# Patient Record
Sex: Female | Born: 1945 | Race: Black or African American | Hispanic: No | Marital: Married | Smoking: Former smoker
Health system: Southern US, Community
[De-identification: ages and names within clinical notes are randomized; demographics above are authoritative.]

## PROBLEM LIST (undated history)

## (undated) DIAGNOSIS — D649 Anemia, unspecified: Secondary | ICD-10-CM

## (undated) DIAGNOSIS — E785 Hyperlipidemia, unspecified: Secondary | ICD-10-CM

## (undated) DIAGNOSIS — I10 Essential (primary) hypertension: Secondary | ICD-10-CM

## (undated) DIAGNOSIS — G2581 Restless legs syndrome: Secondary | ICD-10-CM

## (undated) DIAGNOSIS — I2510 Atherosclerotic heart disease of native coronary artery without angina pectoris: Secondary | ICD-10-CM

## (undated) DIAGNOSIS — J449 Chronic obstructive pulmonary disease, unspecified: Secondary | ICD-10-CM

## (undated) DIAGNOSIS — I219 Acute myocardial infarction, unspecified: Secondary | ICD-10-CM

## (undated) DIAGNOSIS — N952 Postmenopausal atrophic vaginitis: Secondary | ICD-10-CM

## (undated) DIAGNOSIS — M1990 Unspecified osteoarthritis, unspecified site: Secondary | ICD-10-CM

## (undated) HISTORY — DX: Unspecified osteoarthritis, unspecified site: M19.90

## (undated) HISTORY — DX: Restless legs syndrome: G25.81

## (undated) HISTORY — DX: Hyperlipidemia, unspecified: E78.5

## (undated) HISTORY — DX: Postmenopausal atrophic vaginitis: N95.2

## (undated) HISTORY — DX: Chronic obstructive pulmonary disease, unspecified: J44.9

## (undated) HISTORY — DX: Atherosclerotic heart disease of native coronary artery without angina pectoris: I25.10

---

## 1968-12-30 HISTORY — PX: OTHER SURGICAL HISTORY: SHX169

## 1968-12-30 HISTORY — PX: CHOLECYSTECTOMY: SHX55

## 1998-02-24 ENCOUNTER — Emergency Department (HOSPITAL_COMMUNITY): Admission: EM | Admit: 1998-02-24 | Discharge: 1998-02-24 | Payer: Self-pay | Admitting: Emergency Medicine

## 1999-09-04 ENCOUNTER — Emergency Department (HOSPITAL_COMMUNITY): Admission: EM | Admit: 1999-09-04 | Discharge: 1999-09-04 | Payer: Self-pay

## 2004-04-29 ENCOUNTER — Emergency Department (HOSPITAL_COMMUNITY): Admission: EM | Admit: 2004-04-29 | Discharge: 2004-04-30 | Payer: Self-pay | Admitting: Emergency Medicine

## 2004-06-21 ENCOUNTER — Encounter: Admission: RE | Admit: 2004-06-21 | Discharge: 2004-06-21 | Payer: Self-pay | Admitting: Neurology

## 2006-12-20 ENCOUNTER — Emergency Department (HOSPITAL_COMMUNITY): Admission: EM | Admit: 2006-12-20 | Discharge: 2006-12-20 | Payer: Self-pay | Admitting: Emergency Medicine

## 2008-08-24 ENCOUNTER — Emergency Department (HOSPITAL_COMMUNITY): Admission: EM | Admit: 2008-08-24 | Discharge: 2008-08-25 | Payer: Self-pay | Admitting: Emergency Medicine

## 2008-12-11 ENCOUNTER — Encounter: Admission: RE | Admit: 2008-12-11 | Discharge: 2008-12-11 | Payer: Self-pay | Admitting: Family Medicine

## 2009-07-16 ENCOUNTER — Encounter: Admission: RE | Admit: 2009-07-16 | Discharge: 2009-07-16 | Payer: Self-pay | Admitting: Family Medicine

## 2009-12-14 ENCOUNTER — Encounter: Admission: RE | Admit: 2009-12-14 | Discharge: 2009-12-14 | Payer: Self-pay | Admitting: Family Medicine

## 2010-02-11 ENCOUNTER — Encounter: Payer: Self-pay | Admitting: Emergency Medicine

## 2010-02-11 ENCOUNTER — Inpatient Hospital Stay (HOSPITAL_COMMUNITY): Admission: EM | Admit: 2010-02-11 | Discharge: 2010-02-14 | Payer: Self-pay | Admitting: Interventional Cardiology

## 2010-02-14 ENCOUNTER — Encounter (INDEPENDENT_AMBULATORY_CARE_PROVIDER_SITE_OTHER): Payer: Self-pay | Admitting: Interventional Cardiology

## 2010-03-03 ENCOUNTER — Encounter (HOSPITAL_COMMUNITY)
Admission: RE | Admit: 2010-03-03 | Discharge: 2010-05-31 | Payer: Self-pay | Source: Home / Self Care | Attending: Interventional Cardiology | Admitting: Interventional Cardiology

## 2010-04-22 ENCOUNTER — Encounter
Admission: RE | Admit: 2010-04-22 | Discharge: 2010-04-22 | Payer: Self-pay | Source: Home / Self Care | Attending: Family Medicine | Admitting: Family Medicine

## 2010-05-06 LAB — GLUCOSE, CAPILLARY: Glucose-Capillary: 104 mg/dL — ABNORMAL HIGH (ref 70–99)

## 2010-05-16 LAB — GLUCOSE, CAPILLARY: Glucose-Capillary: 200 mg/dL — ABNORMAL HIGH (ref 70–99)

## 2010-05-18 LAB — GLUCOSE, CAPILLARY: Glucose-Capillary: 112 mg/dL — ABNORMAL HIGH (ref 70–99)

## 2010-05-22 ENCOUNTER — Encounter: Payer: Self-pay | Admitting: Family Medicine

## 2010-05-22 ENCOUNTER — Encounter: Payer: Self-pay | Admitting: Neurology

## 2010-05-30 LAB — GLUCOSE, CAPILLARY: Glucose-Capillary: 195 mg/dL — ABNORMAL HIGH (ref 70–99)

## 2010-06-01 ENCOUNTER — Encounter (HOSPITAL_COMMUNITY): Payer: Medicare Other | Attending: Interventional Cardiology

## 2010-06-01 DIAGNOSIS — N289 Disorder of kidney and ureter, unspecified: Secondary | ICD-10-CM | POA: Insufficient documentation

## 2010-06-01 DIAGNOSIS — I2582 Chronic total occlusion of coronary artery: Secondary | ICD-10-CM | POA: Insufficient documentation

## 2010-06-01 DIAGNOSIS — Z9861 Coronary angioplasty status: Secondary | ICD-10-CM | POA: Insufficient documentation

## 2010-06-01 DIAGNOSIS — E119 Type 2 diabetes mellitus without complications: Secondary | ICD-10-CM | POA: Insufficient documentation

## 2010-06-01 DIAGNOSIS — I509 Heart failure, unspecified: Secondary | ICD-10-CM | POA: Insufficient documentation

## 2010-06-01 DIAGNOSIS — Z5189 Encounter for other specified aftercare: Secondary | ICD-10-CM | POA: Insufficient documentation

## 2010-06-01 DIAGNOSIS — I10 Essential (primary) hypertension: Secondary | ICD-10-CM | POA: Insufficient documentation

## 2010-06-01 DIAGNOSIS — F17200 Nicotine dependence, unspecified, uncomplicated: Secondary | ICD-10-CM | POA: Insufficient documentation

## 2010-06-01 DIAGNOSIS — J45909 Unspecified asthma, uncomplicated: Secondary | ICD-10-CM | POA: Insufficient documentation

## 2010-06-01 DIAGNOSIS — I214 Non-ST elevation (NSTEMI) myocardial infarction: Secondary | ICD-10-CM | POA: Insufficient documentation

## 2010-06-01 DIAGNOSIS — I059 Rheumatic mitral valve disease, unspecified: Secondary | ICD-10-CM | POA: Insufficient documentation

## 2010-06-01 DIAGNOSIS — Z7982 Long term (current) use of aspirin: Secondary | ICD-10-CM | POA: Insufficient documentation

## 2010-06-01 DIAGNOSIS — I2510 Atherosclerotic heart disease of native coronary artery without angina pectoris: Secondary | ICD-10-CM | POA: Insufficient documentation

## 2010-06-01 DIAGNOSIS — Z880 Allergy status to penicillin: Secondary | ICD-10-CM | POA: Insufficient documentation

## 2010-06-03 ENCOUNTER — Encounter (HOSPITAL_COMMUNITY): Payer: Medicare Other

## 2010-06-06 ENCOUNTER — Encounter (HOSPITAL_COMMUNITY): Payer: Medicare Other

## 2010-06-08 ENCOUNTER — Encounter (HOSPITAL_COMMUNITY): Payer: Medicare Other

## 2010-06-10 ENCOUNTER — Encounter (HOSPITAL_COMMUNITY): Payer: Medicare Other

## 2010-06-13 ENCOUNTER — Encounter (HOSPITAL_COMMUNITY): Payer: Medicare Other

## 2010-06-15 ENCOUNTER — Encounter (HOSPITAL_COMMUNITY): Payer: Medicare Other

## 2010-06-17 ENCOUNTER — Encounter (HOSPITAL_COMMUNITY): Payer: Medicare Other

## 2010-06-20 ENCOUNTER — Encounter (HOSPITAL_COMMUNITY): Payer: Medicare Other

## 2010-06-22 ENCOUNTER — Encounter (HOSPITAL_COMMUNITY): Payer: Medicare Other

## 2010-06-23 ENCOUNTER — Other Ambulatory Visit: Payer: Self-pay | Admitting: Family Medicine

## 2010-06-23 DIAGNOSIS — E049 Nontoxic goiter, unspecified: Secondary | ICD-10-CM

## 2010-06-24 ENCOUNTER — Encounter (HOSPITAL_COMMUNITY): Payer: Medicare Other

## 2010-06-27 ENCOUNTER — Encounter (HOSPITAL_COMMUNITY): Payer: Medicare Other

## 2010-06-27 ENCOUNTER — Ambulatory Visit
Admission: RE | Admit: 2010-06-27 | Discharge: 2010-06-27 | Disposition: A | Discharge status: Home / Self Care | Payer: Medicare Other | Source: Ambulatory Visit | Attending: Family Medicine | Admitting: Family Medicine

## 2010-06-27 DIAGNOSIS — E049 Nontoxic goiter, unspecified: Secondary | ICD-10-CM

## 2010-06-29 ENCOUNTER — Encounter (HOSPITAL_COMMUNITY): Payer: Medicare Other

## 2010-07-01 ENCOUNTER — Encounter (HOSPITAL_COMMUNITY): Payer: Self-pay

## 2010-07-04 ENCOUNTER — Encounter (HOSPITAL_COMMUNITY): Payer: Self-pay

## 2010-07-06 ENCOUNTER — Encounter (HOSPITAL_COMMUNITY): Payer: Self-pay

## 2010-07-08 ENCOUNTER — Encounter (HOSPITAL_COMMUNITY): Payer: Self-pay

## 2010-07-11 ENCOUNTER — Encounter (HOSPITAL_COMMUNITY): Payer: Self-pay

## 2010-07-11 LAB — GLUCOSE, CAPILLARY
Glucose-Capillary: 119 mg/dL — ABNORMAL HIGH (ref 70–99)
Glucose-Capillary: 136 mg/dL — ABNORMAL HIGH (ref 70–99)
Glucose-Capillary: 138 mg/dL — ABNORMAL HIGH (ref 70–99)
Glucose-Capillary: 152 mg/dL — ABNORMAL HIGH (ref 70–99)
Glucose-Capillary: 153 mg/dL — ABNORMAL HIGH (ref 70–99)
Glucose-Capillary: 99 mg/dL (ref 70–99)

## 2010-07-12 LAB — GLUCOSE, CAPILLARY
Glucose-Capillary: 105 mg/dL — ABNORMAL HIGH (ref 70–99)
Glucose-Capillary: 107 mg/dL — ABNORMAL HIGH (ref 70–99)
Glucose-Capillary: 110 mg/dL — ABNORMAL HIGH (ref 70–99)
Glucose-Capillary: 116 mg/dL — ABNORMAL HIGH (ref 70–99)
Glucose-Capillary: 122 mg/dL — ABNORMAL HIGH (ref 70–99)
Glucose-Capillary: 156 mg/dL — ABNORMAL HIGH (ref 70–99)
Glucose-Capillary: 194 mg/dL — ABNORMAL HIGH (ref 70–99)

## 2010-07-13 ENCOUNTER — Encounter (HOSPITAL_COMMUNITY): Payer: Self-pay

## 2010-07-13 ENCOUNTER — Encounter (HOSPITAL_COMMUNITY): Payer: Self-pay | Attending: Interventional Cardiology

## 2010-07-13 LAB — CBC
HCT: 34.5 % — ABNORMAL LOW (ref 36.0–46.0)
Hemoglobin: 13.3 g/dL (ref 12.0–15.0)
MCH: 25.2 pg — ABNORMAL LOW (ref 26.0–34.0)
MCH: 26.4 pg (ref 26.0–34.0)
MCHC: 31.3 g/dL (ref 30.0–36.0)
MCV: 80.4 fL (ref 78.0–100.0)
MCV: 78.2 fL (ref 78.0–100.0)
Platelets: 266 10*3/uL (ref 150–400)
RBC: 5.05 MIL/uL (ref 3.87–5.11)
RDW: 14.9 % (ref 11.5–15.5)
WBC: 15.9 10*3/uL — ABNORMAL HIGH (ref 4.0–10.5)

## 2010-07-13 LAB — URINE CULTURE
Colony Count: 50000
Culture  Setup Time: 201110150114

## 2010-07-13 LAB — BASIC METABOLIC PANEL
BUN: 8 mg/dL (ref 6–23)
CO2: 20 mEq/L (ref 19–32)
Calcium: 9 mg/dL (ref 8.4–10.5)
Calcium: 9.1 mg/dL (ref 8.4–10.5)
Creatinine, Ser: 0.66 mg/dL (ref 0.4–1.2)
Creatinine, Ser: 0.67 mg/dL (ref 0.4–1.2)
GFR calc non Af Amer: >60 mL/min (ref >60)
GFR calc non Af Amer: >60 mL/min (ref >60)
Glucose, Bld: 173 mg/dL — ABNORMAL HIGH (ref 70–99)
Potassium: 3.6 mEq/L (ref 3.5–5.1)
Sodium: 135 mEq/L (ref 135–145)

## 2010-07-13 LAB — BRAIN NATRIURETIC PEPTIDE
Pro B Natriuretic peptide (BNP): 455 pg/mL — ABNORMAL HIGH (ref 0.0–100.0)
Pro B Natriuretic peptide (BNP): 500 pg/mL — ABNORMAL HIGH (ref 0.0–100.0)

## 2010-07-13 LAB — GLUCOSE, CAPILLARY
Glucose-Capillary: 158 mg/dL — ABNORMAL HIGH (ref 70–99)
Glucose-Capillary: 162 mg/dL — ABNORMAL HIGH (ref 70–99)
Glucose-Capillary: 176 mg/dL — ABNORMAL HIGH (ref 70–99)
Glucose-Capillary: 200 mg/dL — ABNORMAL HIGH (ref 70–99)
Glucose-Capillary: 231 mg/dL — ABNORMAL HIGH (ref 70–99)

## 2010-07-13 LAB — COMPREHENSIVE METABOLIC PANEL
ALT: 23 U/L (ref 0–35)
AST: 52 U/L — ABNORMAL HIGH (ref 0–37)
Albumin: 3.6 g/dL (ref 3.5–5.2)
CO2: 24 mEq/L (ref 19–32)
Calcium: 9.8 mg/dL (ref 8.4–10.5)
Chloride: 102 mEq/L (ref 96–112)
Creatinine, Ser: 1.21 mg/dL — ABNORMAL HIGH (ref 0.4–1.2)
GFR calc Af Amer: 54 mL/min — ABNORMAL LOW (ref >60)
Sodium: 137 mEq/L (ref 135–145)
Total Bilirubin: 0.8 mg/dL (ref 0.3–1.2)

## 2010-07-13 LAB — DIFFERENTIAL
Eosinophils Absolute: 0 10*3/uL (ref 0.0–0.7)
Eosinophils Relative: 0 % (ref 0–5)
Lymphocytes Relative: 28 % (ref 12–46)
Lymphs Abs: 4.5 10*3/uL — ABNORMAL HIGH (ref 0.7–4.0)
Monocytes Absolute: 1.4 10*3/uL — ABNORMAL HIGH (ref 0.1–1.0)

## 2010-07-13 LAB — URINALYSIS, ROUTINE W REFLEX MICROSCOPIC
Nitrite: NEGATIVE
Protein, ur: 30 mg/dL — AB
Urobilinogen, UA: 1 mg/dL (ref 0.0–1.0)

## 2010-07-13 LAB — HEPATIC FUNCTION PANEL
Bilirubin, Direct: <0.1 mg/dL (ref 0.0–0.3)
Total Bilirubin: 0.3 mg/dL (ref 0.3–1.2)

## 2010-07-13 LAB — CK TOTAL AND CKMB (NOT AT ARMC)
CK, MB: 6.4 ng/mL (ref 0.3–4.0)
Relative Index: 3.3 — ABNORMAL HIGH (ref 0.0–2.5)
Relative Index: 4.2 — ABNORMAL HIGH (ref 0.0–2.5)

## 2010-07-13 LAB — LIPID PANEL
Cholesterol: 154 mg/dL (ref 0–200)
LDL Cholesterol: 90 mg/dL (ref 0–99)

## 2010-07-13 LAB — CARDIAC PANEL(CRET KIN+CKTOT+MB+TROPI): Troponin I: 6.89 ng/mL (ref 0.00–0.06)

## 2010-07-13 LAB — URINE MICROSCOPIC-ADD ON

## 2010-07-14 LAB — MRSA PCR SCREENING: MRSA by PCR: NEGATIVE

## 2010-07-14 LAB — GLUCOSE, CAPILLARY: Glucose-Capillary: 164 mg/dL — ABNORMAL HIGH (ref 70–99)

## 2010-07-15 ENCOUNTER — Encounter (HOSPITAL_COMMUNITY): Payer: Self-pay

## 2010-07-18 ENCOUNTER — Encounter (HOSPITAL_COMMUNITY): Payer: Self-pay

## 2010-07-20 ENCOUNTER — Encounter (HOSPITAL_COMMUNITY): Payer: Self-pay

## 2010-07-22 ENCOUNTER — Encounter (HOSPITAL_COMMUNITY): Payer: Self-pay

## 2010-07-25 ENCOUNTER — Encounter (HOSPITAL_COMMUNITY): Payer: Self-pay

## 2010-07-25 ENCOUNTER — Encounter (HOSPITAL_COMMUNITY): Payer: Self-pay | Attending: Interventional Cardiology

## 2010-07-25 ENCOUNTER — Other Ambulatory Visit: Payer: Self-pay | Admitting: Interventional Cardiology

## 2010-07-25 DIAGNOSIS — I2582 Chronic total occlusion of coronary artery: Secondary | ICD-10-CM | POA: Insufficient documentation

## 2010-07-25 DIAGNOSIS — E119 Type 2 diabetes mellitus without complications: Secondary | ICD-10-CM | POA: Insufficient documentation

## 2010-07-25 DIAGNOSIS — Z9861 Coronary angioplasty status: Secondary | ICD-10-CM | POA: Insufficient documentation

## 2010-07-25 DIAGNOSIS — I059 Rheumatic mitral valve disease, unspecified: Secondary | ICD-10-CM | POA: Insufficient documentation

## 2010-07-25 DIAGNOSIS — J45909 Unspecified asthma, uncomplicated: Secondary | ICD-10-CM | POA: Insufficient documentation

## 2010-07-25 DIAGNOSIS — Z7982 Long term (current) use of aspirin: Secondary | ICD-10-CM | POA: Insufficient documentation

## 2010-07-25 DIAGNOSIS — I10 Essential (primary) hypertension: Secondary | ICD-10-CM | POA: Insufficient documentation

## 2010-07-25 DIAGNOSIS — I509 Heart failure, unspecified: Secondary | ICD-10-CM | POA: Insufficient documentation

## 2010-07-25 DIAGNOSIS — I214 Non-ST elevation (NSTEMI) myocardial infarction: Secondary | ICD-10-CM | POA: Insufficient documentation

## 2010-07-25 DIAGNOSIS — Z5189 Encounter for other specified aftercare: Secondary | ICD-10-CM | POA: Insufficient documentation

## 2010-07-25 DIAGNOSIS — Z880 Allergy status to penicillin: Secondary | ICD-10-CM | POA: Insufficient documentation

## 2010-07-25 DIAGNOSIS — I2510 Atherosclerotic heart disease of native coronary artery without angina pectoris: Secondary | ICD-10-CM | POA: Insufficient documentation

## 2010-07-25 DIAGNOSIS — F17200 Nicotine dependence, unspecified, uncomplicated: Secondary | ICD-10-CM | POA: Insufficient documentation

## 2010-07-25 DIAGNOSIS — N289 Disorder of kidney and ureter, unspecified: Secondary | ICD-10-CM | POA: Insufficient documentation

## 2010-07-27 ENCOUNTER — Encounter (HOSPITAL_COMMUNITY): Payer: Self-pay

## 2010-07-29 ENCOUNTER — Encounter (HOSPITAL_COMMUNITY): Payer: Self-pay

## 2010-08-01 ENCOUNTER — Encounter (HOSPITAL_COMMUNITY): Payer: Self-pay | Attending: Interventional Cardiology

## 2010-08-01 ENCOUNTER — Encounter (HOSPITAL_COMMUNITY): Payer: Self-pay

## 2010-08-01 DIAGNOSIS — F17200 Nicotine dependence, unspecified, uncomplicated: Secondary | ICD-10-CM | POA: Insufficient documentation

## 2010-08-01 DIAGNOSIS — I509 Heart failure, unspecified: Secondary | ICD-10-CM | POA: Insufficient documentation

## 2010-08-01 DIAGNOSIS — Z880 Allergy status to penicillin: Secondary | ICD-10-CM | POA: Insufficient documentation

## 2010-08-01 DIAGNOSIS — J45909 Unspecified asthma, uncomplicated: Secondary | ICD-10-CM | POA: Insufficient documentation

## 2010-08-01 DIAGNOSIS — I214 Non-ST elevation (NSTEMI) myocardial infarction: Secondary | ICD-10-CM | POA: Insufficient documentation

## 2010-08-01 DIAGNOSIS — N289 Disorder of kidney and ureter, unspecified: Secondary | ICD-10-CM | POA: Insufficient documentation

## 2010-08-01 DIAGNOSIS — I2510 Atherosclerotic heart disease of native coronary artery without angina pectoris: Secondary | ICD-10-CM | POA: Insufficient documentation

## 2010-08-01 DIAGNOSIS — Z9861 Coronary angioplasty status: Secondary | ICD-10-CM | POA: Insufficient documentation

## 2010-08-01 DIAGNOSIS — E119 Type 2 diabetes mellitus without complications: Secondary | ICD-10-CM | POA: Insufficient documentation

## 2010-08-01 DIAGNOSIS — I2582 Chronic total occlusion of coronary artery: Secondary | ICD-10-CM | POA: Insufficient documentation

## 2010-08-01 DIAGNOSIS — Z7982 Long term (current) use of aspirin: Secondary | ICD-10-CM | POA: Insufficient documentation

## 2010-08-01 DIAGNOSIS — I10 Essential (primary) hypertension: Secondary | ICD-10-CM | POA: Insufficient documentation

## 2010-08-01 DIAGNOSIS — Z5189 Encounter for other specified aftercare: Secondary | ICD-10-CM | POA: Insufficient documentation

## 2010-08-01 DIAGNOSIS — I059 Rheumatic mitral valve disease, unspecified: Secondary | ICD-10-CM | POA: Insufficient documentation

## 2010-08-03 ENCOUNTER — Encounter (HOSPITAL_COMMUNITY): Payer: Self-pay

## 2010-08-05 ENCOUNTER — Encounter (HOSPITAL_COMMUNITY): Payer: Self-pay

## 2010-08-08 ENCOUNTER — Encounter (HOSPITAL_COMMUNITY): Payer: Self-pay

## 2010-08-10 ENCOUNTER — Encounter (HOSPITAL_COMMUNITY): Payer: Self-pay

## 2010-08-10 LAB — DIFFERENTIAL
Basophils Absolute: 0.1 10*3/uL (ref 0.0–0.1)
Lymphs Abs: 3.2 10*3/uL (ref 0.7–4.0)
Monocytes Relative: 6 % (ref 3–12)
Neutrophils Relative %: 53 % (ref 43–77)

## 2010-08-10 LAB — COMPREHENSIVE METABOLIC PANEL
ALT: 18 U/L (ref 0–35)
BUN: 10 mg/dL (ref 6–23)
CO2: 27 mEq/L (ref 19–32)
Calcium: 9.8 mg/dL (ref 8.4–10.5)
Creatinine, Ser: 0.73 mg/dL (ref 0.4–1.2)
GFR calc non Af Amer: >60 mL/min (ref >60)
Glucose, Bld: 342 mg/dL — ABNORMAL HIGH (ref 70–99)
Sodium: 138 mEq/L (ref 135–145)

## 2010-08-10 LAB — CBC
HCT: 43.4 % (ref 36.0–46.0)
Hemoglobin: 14.6 g/dL (ref 12.0–15.0)
MCHC: 33.5 g/dL (ref 30.0–36.0)
MCV: 80 fL (ref 78.0–100.0)
RBC: 5.43 MIL/uL — ABNORMAL HIGH (ref 3.87–5.11)

## 2010-08-10 LAB — POCT CARDIAC MARKERS: Troponin i, poc: <0.05 ng/mL (ref 0.00–0.09)

## 2010-08-12 ENCOUNTER — Encounter (HOSPITAL_COMMUNITY): Payer: Self-pay

## 2010-08-15 ENCOUNTER — Encounter (HOSPITAL_COMMUNITY): Payer: Self-pay

## 2010-08-17 ENCOUNTER — Encounter (HOSPITAL_COMMUNITY): Payer: Self-pay

## 2010-08-19 ENCOUNTER — Encounter (HOSPITAL_COMMUNITY): Payer: Self-pay

## 2010-08-22 ENCOUNTER — Encounter (HOSPITAL_COMMUNITY): Payer: Self-pay

## 2010-08-24 ENCOUNTER — Encounter (HOSPITAL_COMMUNITY): Payer: Self-pay

## 2010-08-26 ENCOUNTER — Encounter (HOSPITAL_COMMUNITY): Payer: Self-pay

## 2010-08-29 ENCOUNTER — Encounter (HOSPITAL_COMMUNITY): Payer: Self-pay

## 2010-08-31 ENCOUNTER — Encounter (HOSPITAL_COMMUNITY): Payer: Self-pay | Attending: Interventional Cardiology

## 2010-08-31 ENCOUNTER — Encounter (HOSPITAL_COMMUNITY): Payer: Medicare Other

## 2010-08-31 DIAGNOSIS — Z880 Allergy status to penicillin: Secondary | ICD-10-CM | POA: Insufficient documentation

## 2010-08-31 DIAGNOSIS — I2510 Atherosclerotic heart disease of native coronary artery without angina pectoris: Secondary | ICD-10-CM | POA: Insufficient documentation

## 2010-08-31 DIAGNOSIS — I10 Essential (primary) hypertension: Secondary | ICD-10-CM | POA: Insufficient documentation

## 2010-08-31 DIAGNOSIS — Z5189 Encounter for other specified aftercare: Secondary | ICD-10-CM | POA: Insufficient documentation

## 2010-08-31 DIAGNOSIS — J45909 Unspecified asthma, uncomplicated: Secondary | ICD-10-CM | POA: Insufficient documentation

## 2010-08-31 DIAGNOSIS — E119 Type 2 diabetes mellitus without complications: Secondary | ICD-10-CM | POA: Insufficient documentation

## 2010-08-31 DIAGNOSIS — Z7982 Long term (current) use of aspirin: Secondary | ICD-10-CM | POA: Insufficient documentation

## 2010-08-31 DIAGNOSIS — Z9861 Coronary angioplasty status: Secondary | ICD-10-CM | POA: Insufficient documentation

## 2010-08-31 DIAGNOSIS — I214 Non-ST elevation (NSTEMI) myocardial infarction: Secondary | ICD-10-CM | POA: Insufficient documentation

## 2010-08-31 DIAGNOSIS — I509 Heart failure, unspecified: Secondary | ICD-10-CM | POA: Insufficient documentation

## 2010-08-31 DIAGNOSIS — I059 Rheumatic mitral valve disease, unspecified: Secondary | ICD-10-CM | POA: Insufficient documentation

## 2010-08-31 DIAGNOSIS — I2582 Chronic total occlusion of coronary artery: Secondary | ICD-10-CM | POA: Insufficient documentation

## 2010-08-31 DIAGNOSIS — N289 Disorder of kidney and ureter, unspecified: Secondary | ICD-10-CM | POA: Insufficient documentation

## 2010-08-31 DIAGNOSIS — F17200 Nicotine dependence, unspecified, uncomplicated: Secondary | ICD-10-CM | POA: Insufficient documentation

## 2010-09-02 ENCOUNTER — Encounter (HOSPITAL_COMMUNITY): Payer: Self-pay

## 2010-09-02 ENCOUNTER — Encounter (HOSPITAL_COMMUNITY): Payer: Medicare Other

## 2010-09-05 ENCOUNTER — Encounter (HOSPITAL_COMMUNITY): Payer: Self-pay

## 2010-09-05 ENCOUNTER — Encounter (HOSPITAL_COMMUNITY): Payer: Medicare Other

## 2010-09-07 ENCOUNTER — Encounter (HOSPITAL_COMMUNITY): Payer: Medicare Other

## 2010-09-07 ENCOUNTER — Encounter (HOSPITAL_COMMUNITY): Payer: Self-pay

## 2010-09-09 ENCOUNTER — Encounter (HOSPITAL_COMMUNITY): Payer: Self-pay

## 2010-09-09 ENCOUNTER — Encounter (HOSPITAL_COMMUNITY): Payer: Medicare Other

## 2010-09-12 ENCOUNTER — Encounter (HOSPITAL_COMMUNITY): Payer: Self-pay

## 2010-09-12 ENCOUNTER — Encounter (HOSPITAL_COMMUNITY): Payer: Medicare Other

## 2010-09-14 ENCOUNTER — Encounter (HOSPITAL_COMMUNITY): Payer: Self-pay

## 2010-09-14 ENCOUNTER — Encounter (HOSPITAL_COMMUNITY): Payer: Medicare Other

## 2010-09-16 ENCOUNTER — Encounter (HOSPITAL_COMMUNITY): Payer: Medicare Other

## 2010-09-16 ENCOUNTER — Encounter (HOSPITAL_COMMUNITY): Payer: Self-pay

## 2010-09-19 ENCOUNTER — Encounter (HOSPITAL_COMMUNITY): Payer: Medicare Other

## 2010-09-19 ENCOUNTER — Encounter (HOSPITAL_COMMUNITY): Payer: Self-pay

## 2010-09-21 ENCOUNTER — Encounter (HOSPITAL_COMMUNITY): Payer: Self-pay

## 2010-09-21 ENCOUNTER — Encounter (HOSPITAL_COMMUNITY): Payer: Medicare Other

## 2010-09-23 ENCOUNTER — Encounter (HOSPITAL_COMMUNITY): Payer: Self-pay

## 2010-09-23 ENCOUNTER — Encounter (HOSPITAL_COMMUNITY): Payer: Medicare Other

## 2010-09-26 ENCOUNTER — Encounter (HOSPITAL_COMMUNITY): Payer: Medicare Other

## 2010-09-26 ENCOUNTER — Encounter (HOSPITAL_COMMUNITY): Payer: Self-pay

## 2010-09-28 ENCOUNTER — Encounter (HOSPITAL_COMMUNITY): Payer: Medicare Other

## 2010-09-28 ENCOUNTER — Encounter (HOSPITAL_COMMUNITY): Payer: Self-pay

## 2010-09-30 ENCOUNTER — Encounter (HOSPITAL_COMMUNITY): Payer: Self-pay | Attending: Interventional Cardiology

## 2010-09-30 ENCOUNTER — Encounter (HOSPITAL_COMMUNITY): Payer: Medicare Other

## 2010-09-30 DIAGNOSIS — I509 Heart failure, unspecified: Secondary | ICD-10-CM | POA: Insufficient documentation

## 2010-09-30 DIAGNOSIS — F17200 Nicotine dependence, unspecified, uncomplicated: Secondary | ICD-10-CM | POA: Insufficient documentation

## 2010-09-30 DIAGNOSIS — Z9861 Coronary angioplasty status: Secondary | ICD-10-CM | POA: Insufficient documentation

## 2010-09-30 DIAGNOSIS — N289 Disorder of kidney and ureter, unspecified: Secondary | ICD-10-CM | POA: Insufficient documentation

## 2010-09-30 DIAGNOSIS — I059 Rheumatic mitral valve disease, unspecified: Secondary | ICD-10-CM | POA: Insufficient documentation

## 2010-09-30 DIAGNOSIS — Z880 Allergy status to penicillin: Secondary | ICD-10-CM | POA: Insufficient documentation

## 2010-09-30 DIAGNOSIS — I2582 Chronic total occlusion of coronary artery: Secondary | ICD-10-CM | POA: Insufficient documentation

## 2010-09-30 DIAGNOSIS — Z7982 Long term (current) use of aspirin: Secondary | ICD-10-CM | POA: Insufficient documentation

## 2010-09-30 DIAGNOSIS — Z5189 Encounter for other specified aftercare: Secondary | ICD-10-CM | POA: Insufficient documentation

## 2010-09-30 DIAGNOSIS — I10 Essential (primary) hypertension: Secondary | ICD-10-CM | POA: Insufficient documentation

## 2010-09-30 DIAGNOSIS — I2510 Atherosclerotic heart disease of native coronary artery without angina pectoris: Secondary | ICD-10-CM | POA: Insufficient documentation

## 2010-09-30 DIAGNOSIS — I214 Non-ST elevation (NSTEMI) myocardial infarction: Secondary | ICD-10-CM | POA: Insufficient documentation

## 2010-09-30 DIAGNOSIS — J45909 Unspecified asthma, uncomplicated: Secondary | ICD-10-CM | POA: Insufficient documentation

## 2010-09-30 DIAGNOSIS — E119 Type 2 diabetes mellitus without complications: Secondary | ICD-10-CM | POA: Insufficient documentation

## 2010-10-03 ENCOUNTER — Encounter (HOSPITAL_COMMUNITY): Payer: Medicare Other

## 2010-10-03 ENCOUNTER — Encounter (HOSPITAL_COMMUNITY): Payer: Self-pay

## 2010-10-05 ENCOUNTER — Encounter (HOSPITAL_COMMUNITY): Payer: Medicare Other

## 2010-10-05 ENCOUNTER — Encounter (HOSPITAL_COMMUNITY): Payer: Self-pay

## 2010-10-07 ENCOUNTER — Encounter (HOSPITAL_COMMUNITY): Payer: Self-pay

## 2010-10-07 ENCOUNTER — Encounter (HOSPITAL_COMMUNITY): Payer: Medicare Other

## 2010-10-10 ENCOUNTER — Encounter (HOSPITAL_COMMUNITY): Payer: Medicare Other

## 2010-10-10 ENCOUNTER — Encounter (HOSPITAL_COMMUNITY): Payer: Self-pay

## 2010-10-12 ENCOUNTER — Encounter (HOSPITAL_COMMUNITY): Payer: Self-pay

## 2010-10-12 ENCOUNTER — Encounter (HOSPITAL_COMMUNITY): Payer: Medicare Other

## 2010-10-14 ENCOUNTER — Encounter (HOSPITAL_COMMUNITY): Payer: Self-pay

## 2010-10-14 ENCOUNTER — Encounter (HOSPITAL_COMMUNITY): Payer: Medicare Other

## 2010-10-17 ENCOUNTER — Encounter (HOSPITAL_COMMUNITY): Payer: Self-pay

## 2010-10-17 ENCOUNTER — Encounter (HOSPITAL_COMMUNITY): Payer: Medicare Other

## 2010-10-19 ENCOUNTER — Encounter (HOSPITAL_COMMUNITY): Payer: Self-pay

## 2010-10-19 ENCOUNTER — Encounter (HOSPITAL_COMMUNITY): Payer: Medicare Other

## 2010-10-21 ENCOUNTER — Encounter (HOSPITAL_COMMUNITY): Payer: Medicare Other

## 2010-10-21 ENCOUNTER — Encounter (HOSPITAL_COMMUNITY): Payer: Self-pay

## 2010-10-24 ENCOUNTER — Encounter (HOSPITAL_COMMUNITY): Payer: Medicare Other

## 2010-10-24 ENCOUNTER — Encounter (HOSPITAL_COMMUNITY): Payer: Self-pay

## 2010-10-26 ENCOUNTER — Encounter (HOSPITAL_COMMUNITY): Payer: Self-pay

## 2010-10-26 ENCOUNTER — Encounter (HOSPITAL_COMMUNITY): Payer: Medicare Other

## 2010-10-28 ENCOUNTER — Encounter (HOSPITAL_COMMUNITY): Payer: Medicare Other

## 2010-10-28 ENCOUNTER — Encounter (HOSPITAL_COMMUNITY): Payer: Self-pay

## 2010-10-31 ENCOUNTER — Encounter (HOSPITAL_COMMUNITY): Payer: Medicare Other

## 2010-11-02 ENCOUNTER — Encounter (HOSPITAL_COMMUNITY): Payer: Medicare Other

## 2010-11-04 ENCOUNTER — Encounter (HOSPITAL_COMMUNITY): Payer: Medicare Other

## 2010-11-07 ENCOUNTER — Encounter (HOSPITAL_COMMUNITY): Payer: Medicare Other

## 2010-11-07 ENCOUNTER — Encounter (HOSPITAL_COMMUNITY): Payer: Self-pay | Attending: Interventional Cardiology

## 2010-11-07 DIAGNOSIS — I2510 Atherosclerotic heart disease of native coronary artery without angina pectoris: Secondary | ICD-10-CM | POA: Insufficient documentation

## 2010-11-07 DIAGNOSIS — I10 Essential (primary) hypertension: Secondary | ICD-10-CM | POA: Insufficient documentation

## 2010-11-07 DIAGNOSIS — N289 Disorder of kidney and ureter, unspecified: Secondary | ICD-10-CM | POA: Insufficient documentation

## 2010-11-07 DIAGNOSIS — E119 Type 2 diabetes mellitus without complications: Secondary | ICD-10-CM | POA: Insufficient documentation

## 2010-11-07 DIAGNOSIS — I2582 Chronic total occlusion of coronary artery: Secondary | ICD-10-CM | POA: Insufficient documentation

## 2010-11-07 DIAGNOSIS — Z7982 Long term (current) use of aspirin: Secondary | ICD-10-CM | POA: Insufficient documentation

## 2010-11-07 DIAGNOSIS — F17200 Nicotine dependence, unspecified, uncomplicated: Secondary | ICD-10-CM | POA: Insufficient documentation

## 2010-11-07 DIAGNOSIS — J45909 Unspecified asthma, uncomplicated: Secondary | ICD-10-CM | POA: Insufficient documentation

## 2010-11-07 DIAGNOSIS — Z5189 Encounter for other specified aftercare: Secondary | ICD-10-CM | POA: Insufficient documentation

## 2010-11-07 DIAGNOSIS — I509 Heart failure, unspecified: Secondary | ICD-10-CM | POA: Insufficient documentation

## 2010-11-07 DIAGNOSIS — Z9861 Coronary angioplasty status: Secondary | ICD-10-CM | POA: Insufficient documentation

## 2010-11-07 DIAGNOSIS — Z880 Allergy status to penicillin: Secondary | ICD-10-CM | POA: Insufficient documentation

## 2010-11-07 DIAGNOSIS — I059 Rheumatic mitral valve disease, unspecified: Secondary | ICD-10-CM | POA: Insufficient documentation

## 2010-11-07 DIAGNOSIS — I214 Non-ST elevation (NSTEMI) myocardial infarction: Secondary | ICD-10-CM | POA: Insufficient documentation

## 2010-11-09 ENCOUNTER — Encounter (HOSPITAL_COMMUNITY): Payer: Self-pay

## 2010-11-09 ENCOUNTER — Encounter (HOSPITAL_COMMUNITY): Payer: Medicare Other

## 2010-11-11 ENCOUNTER — Encounter (HOSPITAL_COMMUNITY): Payer: Medicare Other

## 2010-11-11 ENCOUNTER — Encounter (HOSPITAL_COMMUNITY): Payer: Self-pay

## 2010-11-14 ENCOUNTER — Encounter (HOSPITAL_COMMUNITY): Payer: Medicare Other

## 2010-11-14 ENCOUNTER — Encounter (HOSPITAL_COMMUNITY): Payer: Self-pay

## 2010-11-16 ENCOUNTER — Encounter (HOSPITAL_COMMUNITY): Payer: Self-pay

## 2010-11-16 ENCOUNTER — Encounter (HOSPITAL_COMMUNITY): Payer: Medicare Other

## 2010-11-18 ENCOUNTER — Encounter (HOSPITAL_COMMUNITY): Payer: Self-pay

## 2010-11-18 ENCOUNTER — Encounter (HOSPITAL_COMMUNITY): Payer: Medicare Other

## 2010-11-21 ENCOUNTER — Encounter (HOSPITAL_COMMUNITY): Payer: Self-pay

## 2010-11-21 ENCOUNTER — Encounter (HOSPITAL_COMMUNITY): Payer: Medicare Other

## 2010-11-23 ENCOUNTER — Encounter (HOSPITAL_COMMUNITY): Payer: Medicare Other

## 2010-11-23 ENCOUNTER — Encounter (HOSPITAL_COMMUNITY): Payer: Self-pay

## 2010-11-25 ENCOUNTER — Encounter (HOSPITAL_COMMUNITY): Payer: Medicare Other

## 2010-11-25 ENCOUNTER — Encounter (HOSPITAL_COMMUNITY): Payer: Self-pay

## 2010-11-28 ENCOUNTER — Encounter (HOSPITAL_COMMUNITY): Payer: Self-pay

## 2010-11-28 ENCOUNTER — Encounter (HOSPITAL_COMMUNITY): Payer: Medicare Other

## 2010-11-30 ENCOUNTER — Encounter (HOSPITAL_COMMUNITY): Payer: Medicare Other

## 2010-11-30 ENCOUNTER — Encounter (HOSPITAL_COMMUNITY): Payer: Self-pay | Attending: Interventional Cardiology

## 2010-11-30 DIAGNOSIS — I2510 Atherosclerotic heart disease of native coronary artery without angina pectoris: Secondary | ICD-10-CM | POA: Insufficient documentation

## 2010-11-30 DIAGNOSIS — Z5189 Encounter for other specified aftercare: Secondary | ICD-10-CM | POA: Insufficient documentation

## 2010-11-30 DIAGNOSIS — F17200 Nicotine dependence, unspecified, uncomplicated: Secondary | ICD-10-CM | POA: Insufficient documentation

## 2010-11-30 DIAGNOSIS — I10 Essential (primary) hypertension: Secondary | ICD-10-CM | POA: Insufficient documentation

## 2010-11-30 DIAGNOSIS — J45909 Unspecified asthma, uncomplicated: Secondary | ICD-10-CM | POA: Insufficient documentation

## 2010-11-30 DIAGNOSIS — I059 Rheumatic mitral valve disease, unspecified: Secondary | ICD-10-CM | POA: Insufficient documentation

## 2010-11-30 DIAGNOSIS — Z7982 Long term (current) use of aspirin: Secondary | ICD-10-CM | POA: Insufficient documentation

## 2010-11-30 DIAGNOSIS — E119 Type 2 diabetes mellitus without complications: Secondary | ICD-10-CM | POA: Insufficient documentation

## 2010-11-30 DIAGNOSIS — N289 Disorder of kidney and ureter, unspecified: Secondary | ICD-10-CM | POA: Insufficient documentation

## 2010-11-30 DIAGNOSIS — Z880 Allergy status to penicillin: Secondary | ICD-10-CM | POA: Insufficient documentation

## 2010-11-30 DIAGNOSIS — I2582 Chronic total occlusion of coronary artery: Secondary | ICD-10-CM | POA: Insufficient documentation

## 2010-11-30 DIAGNOSIS — I509 Heart failure, unspecified: Secondary | ICD-10-CM | POA: Insufficient documentation

## 2010-11-30 DIAGNOSIS — Z9861 Coronary angioplasty status: Secondary | ICD-10-CM | POA: Insufficient documentation

## 2010-11-30 DIAGNOSIS — I214 Non-ST elevation (NSTEMI) myocardial infarction: Secondary | ICD-10-CM | POA: Insufficient documentation

## 2010-12-02 ENCOUNTER — Encounter (HOSPITAL_COMMUNITY): Payer: Self-pay

## 2010-12-02 ENCOUNTER — Encounter (HOSPITAL_COMMUNITY): Payer: Medicare Other

## 2010-12-05 ENCOUNTER — Encounter (HOSPITAL_COMMUNITY): Payer: Medicare Other

## 2010-12-05 ENCOUNTER — Encounter (HOSPITAL_COMMUNITY): Payer: Self-pay

## 2010-12-07 ENCOUNTER — Encounter (HOSPITAL_COMMUNITY): Payer: Medicare Other

## 2010-12-07 ENCOUNTER — Encounter (HOSPITAL_COMMUNITY): Payer: Self-pay

## 2010-12-09 ENCOUNTER — Encounter (HOSPITAL_COMMUNITY): Payer: Medicare Other

## 2010-12-09 ENCOUNTER — Encounter (HOSPITAL_COMMUNITY): Payer: Self-pay

## 2010-12-12 ENCOUNTER — Encounter (HOSPITAL_COMMUNITY): Payer: Self-pay

## 2010-12-12 ENCOUNTER — Encounter (HOSPITAL_COMMUNITY): Payer: Medicare Other

## 2010-12-14 ENCOUNTER — Encounter (HOSPITAL_COMMUNITY): Payer: Self-pay

## 2010-12-14 ENCOUNTER — Encounter (HOSPITAL_COMMUNITY): Payer: Medicare Other

## 2010-12-16 ENCOUNTER — Encounter (HOSPITAL_COMMUNITY): Payer: Medicare Other

## 2010-12-16 ENCOUNTER — Encounter (HOSPITAL_COMMUNITY): Payer: Self-pay

## 2010-12-19 ENCOUNTER — Encounter (HOSPITAL_COMMUNITY): Payer: Medicare Other

## 2010-12-19 ENCOUNTER — Encounter (HOSPITAL_COMMUNITY): Payer: Self-pay

## 2010-12-21 ENCOUNTER — Encounter (HOSPITAL_COMMUNITY): Payer: Medicare Other

## 2010-12-21 ENCOUNTER — Encounter (HOSPITAL_COMMUNITY): Payer: Self-pay

## 2010-12-23 ENCOUNTER — Encounter (HOSPITAL_COMMUNITY): Payer: Self-pay

## 2010-12-23 ENCOUNTER — Encounter (HOSPITAL_COMMUNITY): Payer: Medicare Other

## 2010-12-26 ENCOUNTER — Encounter (HOSPITAL_COMMUNITY): Payer: Medicare Other

## 2010-12-26 ENCOUNTER — Encounter (HOSPITAL_COMMUNITY): Payer: Self-pay

## 2010-12-28 ENCOUNTER — Encounter (HOSPITAL_COMMUNITY): Payer: Medicare Other

## 2010-12-28 ENCOUNTER — Encounter (HOSPITAL_COMMUNITY): Payer: Self-pay

## 2010-12-30 ENCOUNTER — Encounter (HOSPITAL_COMMUNITY): Payer: Medicare Other

## 2010-12-30 ENCOUNTER — Encounter (HOSPITAL_COMMUNITY): Payer: Self-pay

## 2011-01-02 ENCOUNTER — Encounter (HOSPITAL_COMMUNITY): Payer: Medicare Other

## 2011-01-04 ENCOUNTER — Encounter (HOSPITAL_COMMUNITY): Payer: Medicare Other

## 2011-01-06 ENCOUNTER — Encounter (HOSPITAL_COMMUNITY): Payer: Medicare Other

## 2011-01-09 ENCOUNTER — Encounter (HOSPITAL_COMMUNITY): Payer: Medicare Other

## 2011-01-11 ENCOUNTER — Encounter (HOSPITAL_COMMUNITY): Payer: Medicare Other

## 2011-01-13 ENCOUNTER — Encounter (HOSPITAL_COMMUNITY): Payer: Medicare Other

## 2011-01-16 ENCOUNTER — Encounter (HOSPITAL_COMMUNITY): Payer: Medicare Other

## 2011-01-18 ENCOUNTER — Encounter (HOSPITAL_COMMUNITY): Payer: Medicare Other

## 2011-01-20 ENCOUNTER — Encounter (HOSPITAL_COMMUNITY): Payer: Medicare Other

## 2011-01-23 ENCOUNTER — Encounter (HOSPITAL_COMMUNITY): Payer: Medicare Other

## 2011-01-25 ENCOUNTER — Encounter (HOSPITAL_COMMUNITY): Payer: Medicare Other

## 2011-01-27 ENCOUNTER — Encounter (HOSPITAL_COMMUNITY): Payer: Medicare Other

## 2011-01-30 ENCOUNTER — Encounter (HOSPITAL_COMMUNITY): Payer: Medicare Other

## 2011-01-31 ENCOUNTER — Other Ambulatory Visit: Payer: Self-pay | Admitting: Family Medicine

## 2011-01-31 DIAGNOSIS — Z1231 Encounter for screening mammogram for malignant neoplasm of breast: Secondary | ICD-10-CM

## 2011-02-01 ENCOUNTER — Encounter (HOSPITAL_COMMUNITY): Payer: Medicare Other

## 2011-02-03 ENCOUNTER — Encounter (HOSPITAL_COMMUNITY): Payer: Medicare Other

## 2011-02-06 ENCOUNTER — Encounter (HOSPITAL_COMMUNITY): Payer: Medicare Other

## 2011-02-08 ENCOUNTER — Encounter (HOSPITAL_COMMUNITY): Payer: Medicare Other

## 2011-02-10 ENCOUNTER — Ambulatory Visit
Admission: RE | Admit: 2011-02-10 | Discharge: 2011-02-10 | Disposition: A | Discharge status: Home / Self Care | Payer: Medicare Other | Source: Ambulatory Visit | Attending: Family Medicine | Admitting: Family Medicine

## 2011-02-10 ENCOUNTER — Encounter (HOSPITAL_COMMUNITY): Payer: Medicare Other

## 2011-02-10 DIAGNOSIS — Z1231 Encounter for screening mammogram for malignant neoplasm of breast: Secondary | ICD-10-CM

## 2011-02-10 LAB — BASIC METABOLIC PANEL
BUN: 7
CO2: 25
Chloride: 104
Creatinine, Ser: 0.76

## 2011-02-10 LAB — URINALYSIS, ROUTINE W REFLEX MICROSCOPIC
Bilirubin Urine: NEGATIVE
Nitrite: NEGATIVE
Specific Gravity, Urine: 1.03
Urobilinogen, UA: 1

## 2011-02-10 LAB — DIFFERENTIAL
Basophils Absolute: 0
Eosinophils Absolute: 0.1
Monocytes Absolute: 0.3
Neutrophils Relative %: 61

## 2011-02-10 LAB — CBC
MCHC: 34
MCV: 79.8
Platelets: 202
WBC: 10.1

## 2011-02-10 LAB — URINE MICROSCOPIC-ADD ON

## 2011-02-13 ENCOUNTER — Encounter (HOSPITAL_COMMUNITY): Payer: Medicare Other

## 2011-02-15 ENCOUNTER — Encounter (HOSPITAL_COMMUNITY): Payer: Medicare Other

## 2011-02-17 ENCOUNTER — Encounter (HOSPITAL_COMMUNITY): Payer: Medicare Other

## 2011-02-20 ENCOUNTER — Encounter (HOSPITAL_COMMUNITY): Payer: Medicare Other

## 2011-02-22 ENCOUNTER — Encounter (HOSPITAL_COMMUNITY): Payer: Medicare Other

## 2011-02-24 ENCOUNTER — Encounter (HOSPITAL_COMMUNITY): Payer: Medicare Other

## 2011-02-27 ENCOUNTER — Encounter (HOSPITAL_COMMUNITY): Payer: Medicare Other

## 2011-03-01 ENCOUNTER — Encounter (HOSPITAL_COMMUNITY): Payer: Medicare Other

## 2011-03-03 ENCOUNTER — Encounter (HOSPITAL_COMMUNITY): Payer: Medicare Other

## 2011-03-06 ENCOUNTER — Encounter (HOSPITAL_COMMUNITY): Payer: Medicare Other

## 2011-03-08 ENCOUNTER — Encounter (HOSPITAL_COMMUNITY): Payer: Medicare Other

## 2011-03-10 ENCOUNTER — Encounter (HOSPITAL_COMMUNITY): Payer: Medicare Other

## 2011-03-13 ENCOUNTER — Encounter (HOSPITAL_COMMUNITY): Payer: Medicare Other

## 2011-03-15 ENCOUNTER — Encounter (HOSPITAL_COMMUNITY): Payer: Medicare Other

## 2011-03-17 ENCOUNTER — Encounter (HOSPITAL_COMMUNITY): Payer: Medicare Other

## 2011-03-20 ENCOUNTER — Encounter (HOSPITAL_COMMUNITY): Payer: Medicare Other

## 2011-03-20 IMAGING — US US SOFT TISSUE HEAD/NECK
1 series · 14 of 25 positions shown · non-contrast
Comparison: None.

CLINICAL DATA: History of thyroid goiter

THYROID ULTRASOUND
TECHNIQUE: Ultrasound examination of the thyroid gland and adjacent
soft tissues was performed.

[Series 1: us soft tissue head/neck · 0.09mm/px · 14 of 67 slices shown]
[im 1/67]
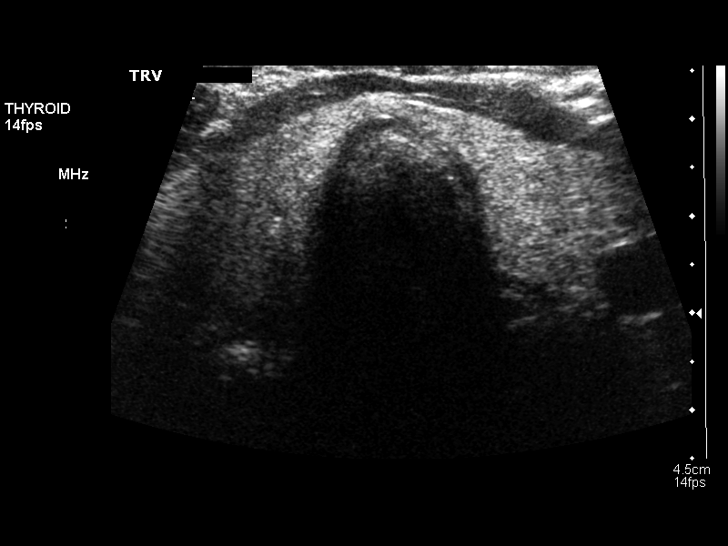
[im 6/67]
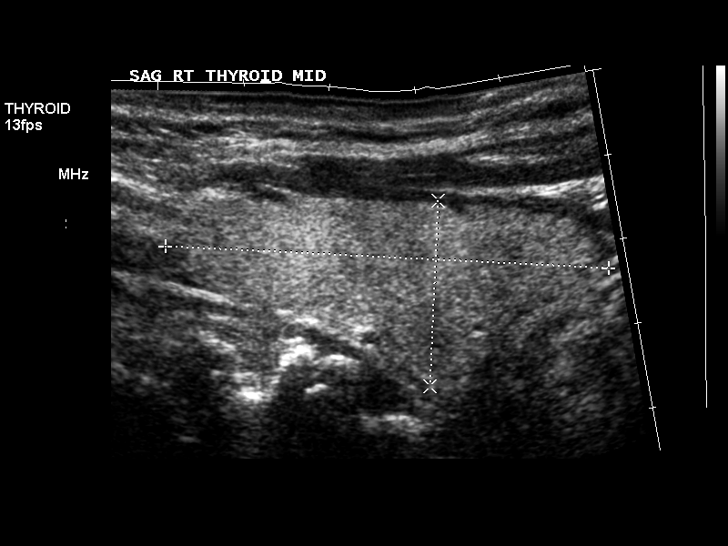
[im 12/67]
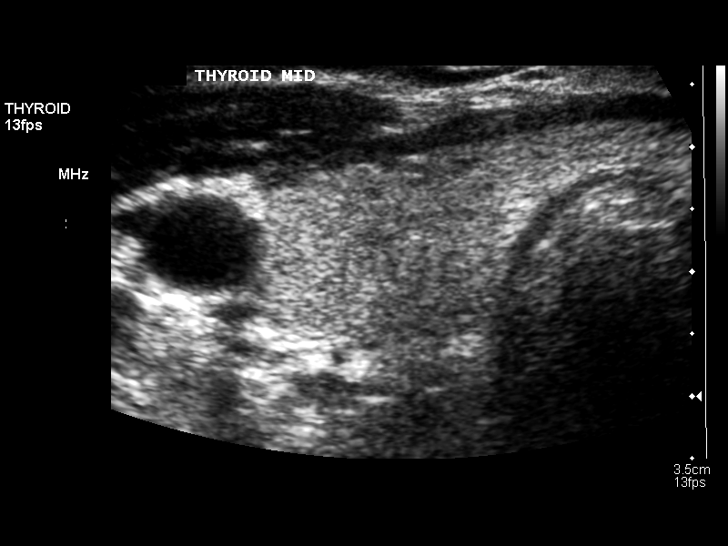
[im 17/67]
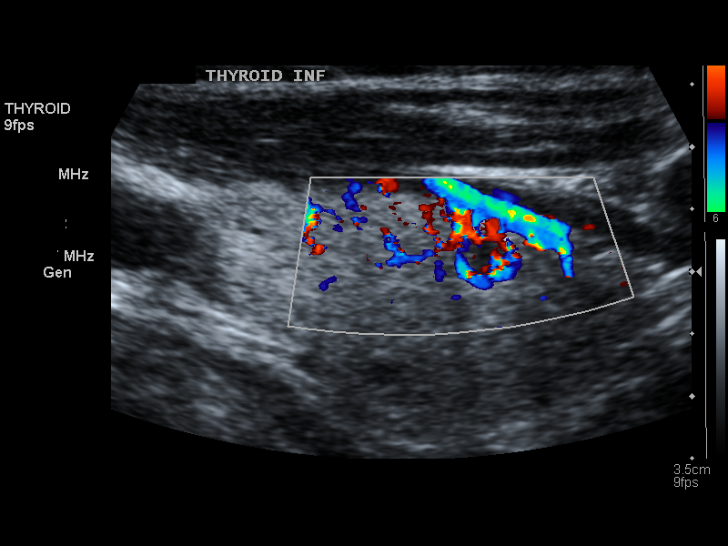
[im 23/67]
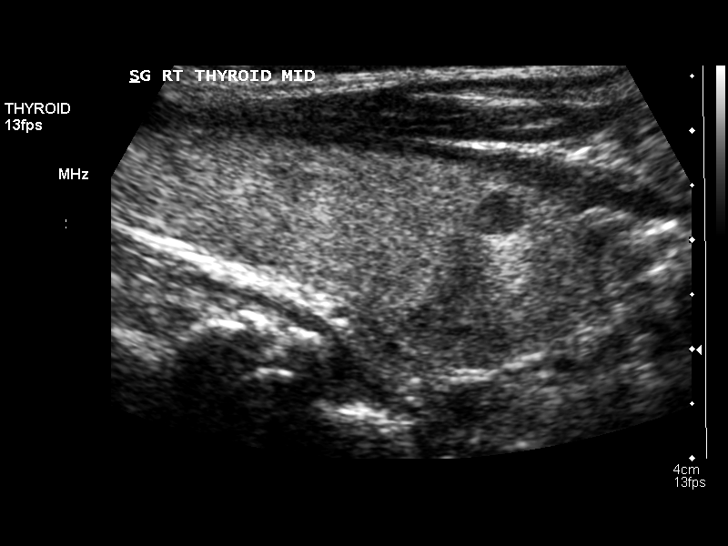
[im 25/67]
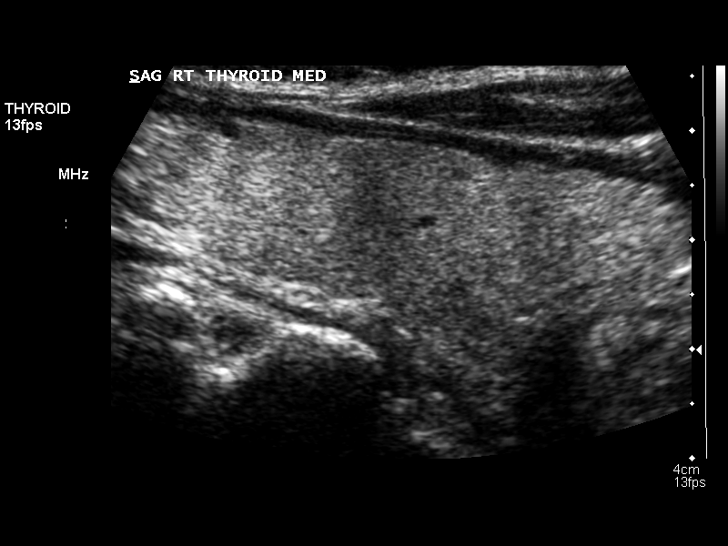
[im 31/67]
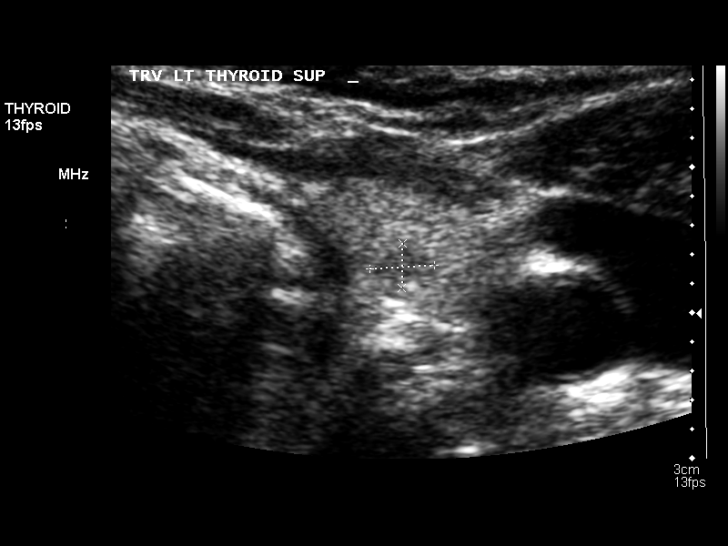
[im 36/67]
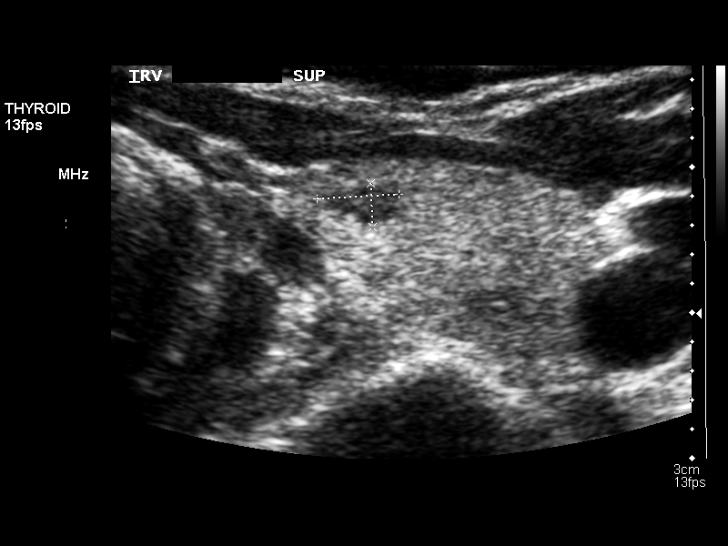
[im 42/67]
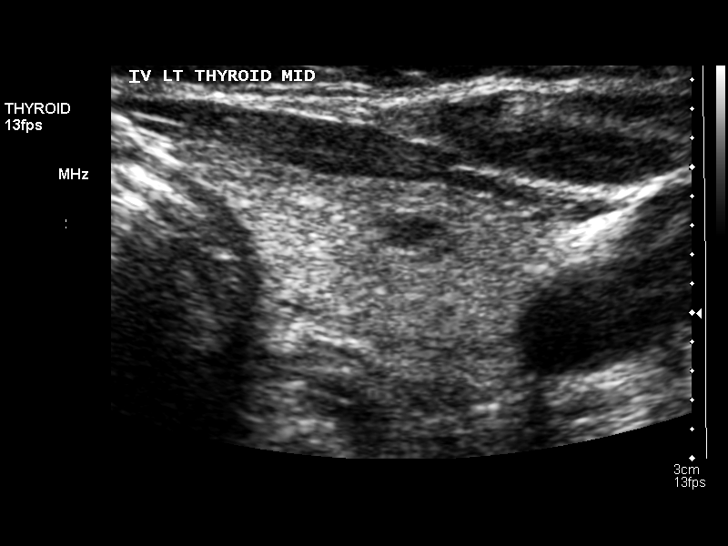
[im 45/67]
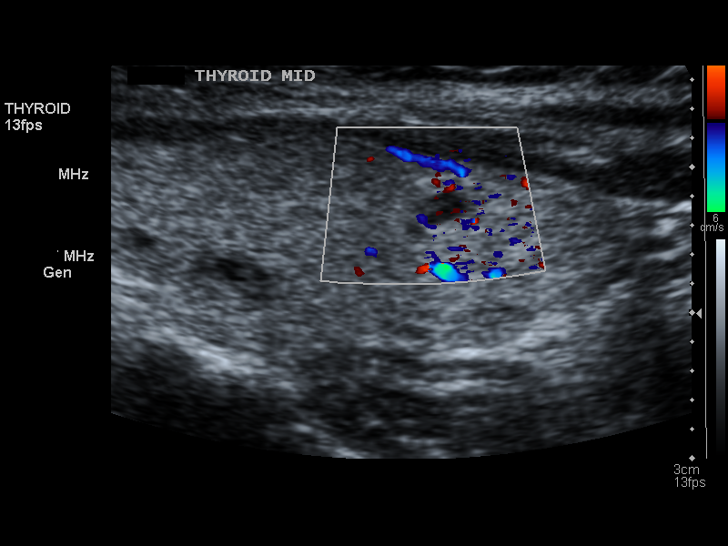
[im 50/67]
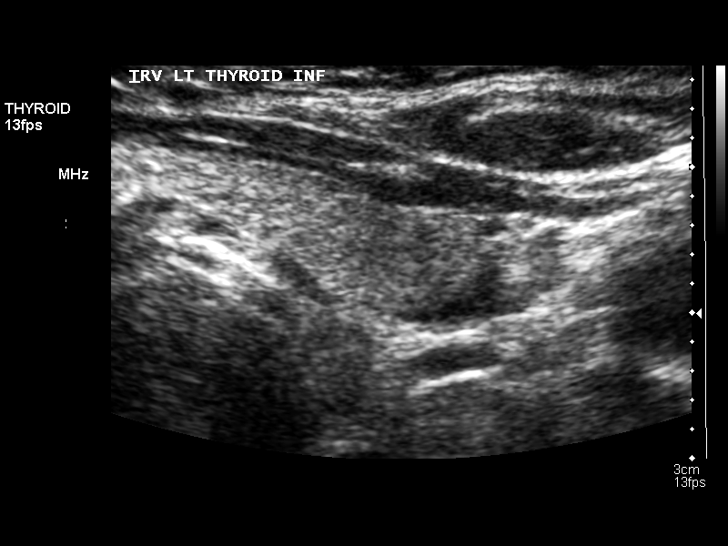
[im 56/67]
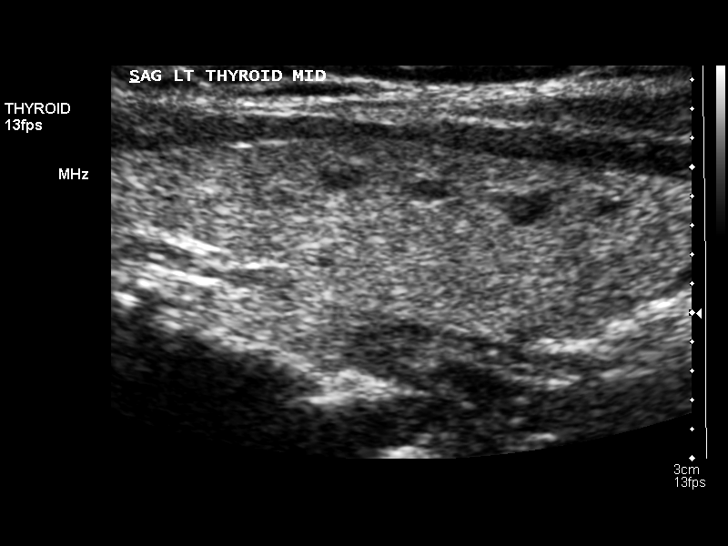
[im 61/67]
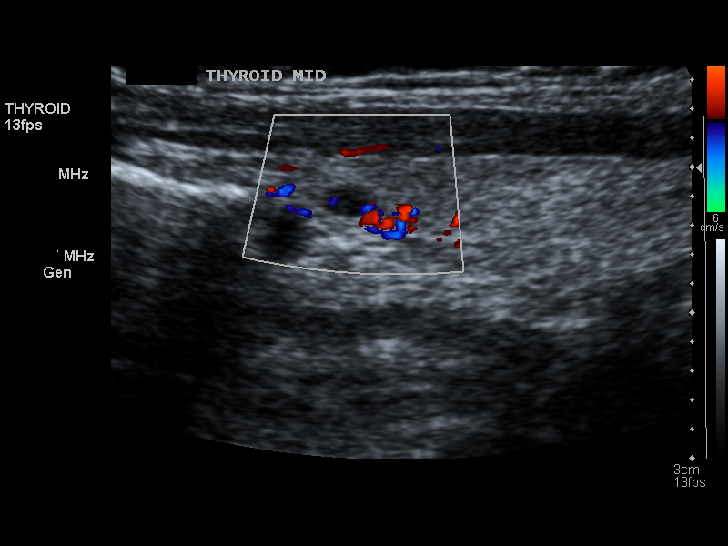
[im 67/67]
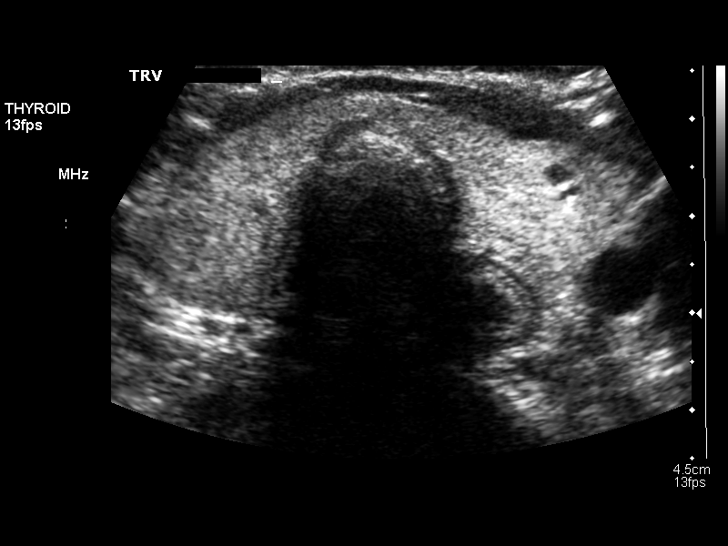

[14 of 25 positions shown; findings below may reference images not displayed]

FINDINGS: Right thyroid lobe:  5.2 x 2.2 x 1.9 cm.
Left thyroid lobe:  4.5 x 1.5 x 1.8 cm.
Isthmus:  4.8 mm.

Focal nodules:  The thyroid gland is relatively homogeneous in
echogenicity.  Nodules are noted bilaterally.  The majority of
thyroid nodules are hypoechoic and small.  The largest nodule is in
the left lobe medially with a similar nodule in the lower pole on
the right, both of which measure no more than 6 mm in diameter.
The largest solid nodule is in the left mid lobe measuring 5 x 2 x
4 mm.

Lymphadenopathy:  None visualized.
IMPRESSION: Multiple small primarily hypoechoic nodules of no more than 6 mm in
diameter.

## 2011-03-22 ENCOUNTER — Telehealth: Payer: Self-pay | Admitting: Oncology

## 2011-03-22 ENCOUNTER — Encounter (HOSPITAL_COMMUNITY): Payer: Medicare Other

## 2011-03-22 NOTE — Telephone Encounter (Signed)
lmonvm for pt re calling me for appt w/FS. 1st attempt.

## 2011-03-24 ENCOUNTER — Encounter (HOSPITAL_COMMUNITY): Payer: Medicare Other

## 2011-03-27 ENCOUNTER — Encounter (HOSPITAL_COMMUNITY): Payer: Medicare Other

## 2011-03-27 ENCOUNTER — Telehealth: Payer: Self-pay | Admitting: Oncology

## 2011-03-27 NOTE — Telephone Encounter (Signed)
lmonvm for pt re calling me for appt w/FS (2nd).

## 2011-03-28 ENCOUNTER — Telehealth: Payer: Self-pay | Admitting: Oncology

## 2011-03-28 NOTE — Telephone Encounter (Signed)
lmonvm for pt re appt w/FS. 3rd attempt referral sent back to HIM.

## 2011-03-29 ENCOUNTER — Encounter (HOSPITAL_COMMUNITY): Payer: Medicare Other

## 2011-03-31 ENCOUNTER — Encounter (HOSPITAL_COMMUNITY): Payer: Medicare Other

## 2011-04-03 ENCOUNTER — Encounter (HOSPITAL_COMMUNITY): Payer: Medicare Other

## 2011-04-03 ENCOUNTER — Telehealth: Payer: Self-pay | Admitting: Oncology

## 2011-04-03 NOTE — Telephone Encounter (Signed)
S/w pt re appt for 12/5 @ 10:30 am w/FS.

## 2011-04-04 ENCOUNTER — Other Ambulatory Visit: Payer: Self-pay | Admitting: Oncology

## 2011-04-04 DIAGNOSIS — D649 Anemia, unspecified: Secondary | ICD-10-CM

## 2011-04-05 ENCOUNTER — Encounter (HOSPITAL_COMMUNITY): Payer: Medicare Other

## 2011-04-05 ENCOUNTER — Ambulatory Visit: Payer: Medicare Other

## 2011-04-05 ENCOUNTER — Other Ambulatory Visit (HOSPITAL_BASED_OUTPATIENT_CLINIC_OR_DEPARTMENT_OTHER): Payer: Medicare Other | Admitting: Lab

## 2011-04-05 ENCOUNTER — Encounter: Payer: Self-pay | Admitting: *Deleted

## 2011-04-05 ENCOUNTER — Ambulatory Visit (HOSPITAL_BASED_OUTPATIENT_CLINIC_OR_DEPARTMENT_OTHER): Payer: Medicare Other | Admitting: Oncology

## 2011-04-05 ENCOUNTER — Telehealth: Payer: Self-pay | Admitting: Oncology

## 2011-04-05 VITALS — BP 138/64 | HR 89 | Temp 96.9°F | Ht 62.0 in | Wt 213.7 lb

## 2011-04-05 DIAGNOSIS — Z8679 Personal history of other diseases of the circulatory system: Secondary | ICD-10-CM

## 2011-04-05 DIAGNOSIS — R5383 Other fatigue: Secondary | ICD-10-CM

## 2011-04-05 DIAGNOSIS — D509 Iron deficiency anemia, unspecified: Secondary | ICD-10-CM

## 2011-04-05 DIAGNOSIS — D649 Anemia, unspecified: Secondary | ICD-10-CM

## 2011-04-05 DIAGNOSIS — R5381 Other malaise: Secondary | ICD-10-CM

## 2011-04-05 LAB — IRON AND TIBC
%SAT: 2 % — ABNORMAL LOW (ref 20–55)
Iron: 13 ug/dL — ABNORMAL LOW (ref 42–145)
TIBC: 571 ug/dL — ABNORMAL HIGH (ref 250–470)
UIBC: 558 ug/dL — ABNORMAL HIGH (ref 125–400)

## 2011-04-05 LAB — CBC & DIFF AND RETIC
Basophils Absolute: 0.1 10*3/uL (ref 0.0–0.1)
Eosinophils Absolute: 0.2 10*3/uL (ref 0.0–0.5)
HCT: 30.9 % — ABNORMAL LOW (ref 34.8–46.6)
LYMPH%: 38.4 % (ref 14.0–49.7)
MCV: 61.9 fL — ABNORMAL LOW (ref 79.5–101.0)
MONO%: 7.8 % (ref 0.0–14.0)
NEUT#: 4.7 10*3/uL (ref 1.5–6.5)
NEUT%: 51.5 % (ref 38.4–76.8)
Platelets: 289 10*3/uL (ref 145–400)
RBC: 4.99 10*6/uL (ref 3.70–5.45)
Retic %: 2 % (ref 0.70–2.10)

## 2011-04-05 LAB — CHCC SMEAR

## 2011-04-05 NOTE — Progress Notes (Signed)
CC:   Courtney [name], M.D.  REASON FOR CONSULTATION:  Anemia.  HISTORY OF PRESENT ILLNESS:  This is a pleasant 65 year old [place] American female, currently of [place], lived in [place] for an extended period of time, originally native of [place].  She has worked as a Retail buyer, but currently retired and lives with her husband.  She has a past medical history significant for diabetes and hyperlipidemia. She has also a history of coronary artery disease.  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 Eldridge Dace.  She has also been on aspirin and Plavix due to her stent placement.  The patient recently has started developing more progressive fatigue, weakness, feeling dizzy at times, and was evaluated by Dr. Duanne Guess, her primary care physician and she was noted to be anemic.  On 03/21/2011, she had a CBC which showed a hemoglobin of 8.5.  Her MCV was 62, RDW of 18.9, platelet count of 312, white cell count of 10.1.  She did have a ferritin level that was drawn, but I do not have actually the results, although there is a mention of it being low and her having iron deficiency.  The patient was referred to me for evaluation regarding her anemia.  She is quite symptomatic from this.  She does report dizziness, does report some occasional fatigue.  Does not report any chest pain, does not report any shortness of breath.  She did report ice cravings.  She did not report any frank hematochezia.  Did not report any melena.  She was started on iron replacement at 325 mg apparently about November 21st or so, although she is calling it vitamin D supplements, which is unclear to me if she is really taking any iron supplements at this time.  She again as mentioned, did not report any genitourinary bleeding, did not report  any hemoptysis, did not report any hematemesis, did not report any epistaxis.  Her bowels have been moving regularly.  She did have a colonoscopy about 4 years ago and she had a polyp at that time.  REVIEW OF SYSTEMS:  Does not report any headaches, blurry vision, double vision.  Does not any motor or sensory neuropathy, alteration in mental status, psychiatric issues, depression.  Does not report any fever, chills, sweats.  Does not report any cough, hemoptysis, hematemesis.  No nausea or vomiting or abdominal pain.  No hematochezia or melena.  No genitourinary complaints.  Rest of review of systems is unremarkable.  PAST MEDICAL HISTORY:  Significant for hyperlipidemia, hypertension, coronary artery disease, and diabetes.  CURRENT MEDICATIONS:  She is on albuterol, amlodipine, aspirin, pravastatin, calcium, vitamin D supplement.  She is on Plavix, diclofenac, furosemide, Neurontin, Cozaar, Glucophage, metoprolol, triamcinolone, Symbicort, Colace, and polyethylene glycol.  ALLERGIES:  None.  SOCIAL HISTORY:  She is married.  She has a daughter.  Denied alcohol abuse.  She smokes about a half a pack a day.  FAMILY HISTORY:  Really unremarkable for any malignant disorders or anemia.  Mother had some heart problems, although she is unclear about exactly what she had.  ALLERGIES:  Penicillin.  PHYSICAL EXAMINATION:  General:  Alert, awake female, appeared in no active distress.  Vital Signs:  Her blood pressure is 130/64, pulse 89, respirations 20, she is afebrile.  HEENT:  Head is normocephalic, atraumatic.  Pupils equal, round, reactive to  light.  Oral mucosa moist and pink.  Neck:  Supple without lymphadenopathy.  Heart:  Regular rate and rhythm, S1 and S2.  Lungs:  Clear to auscultation.  No rhonchi, wheezes, or dullness to percussion.  Abdomen:  Soft, nontender.  No hepatosplenomegaly.  Extremities:  No clubbing, cyanosis, or edema. Neurologically:  Intact motor, sensory, and  deep tendon reflexes.  LABORATORY DATA:  Showed a hemoglobin of 8.5, white cell count of 9.1, platelet count of 289.  Her MCV is 61, her RDW at 17.7.  A peripheral smear was personally reviewed today and she had clear evidence of severe hypochromia, as well as microcytosis.  She has no evidence of any schistocytosis or red cell fragmentation.  There is no evidence of spherocytes or polychromasia.  White cells appeared normal.  Platelets appeared slightly enlarged in number.  ASSESSMENT AND PLAN:  This is a pleasant 65 year old female with the following issues: 1. Iron-deficiency anemia with severe microcytosis with a hemoglobin     around 8.5.  She is mildly symptomatic at this point.  I discussed     the differential diagnosis with the patient today.  I feel that it     is rather unusual, given the fact that she is postmenopausal, she     had a hysterectomy a while ago, to be iron deficient.  Looking     back, her hemoglobins in the past have been relatively normal, so     that tells me this is really more of a rather subacute onset.  As     mentioned, she has been on aspirin and Plavix, which really opens     the debate for possibly she is having occult GI bleeding that     causes this iron deficiency anemia.  In terms of working this up, I     will repeat her iron studies and I will also obtain a fecal occult     testing for blood.  If she does have occult blood in her feces     today, I will refer her to GI for possible repeat scoping at this     time.  In terms of correcting her iron, I have given her the option     of iron replacement.  Risks and benefits of IV iron were discussed     today in detail.  I think that the form of Duncan Dull is a reasonable     option.  A total of 1 g given over 1 hour infusion may be a     reasonable way to go.  Risks and complications associated with     that, including infusion related toxicity and reactions,     arthralgias, and myalgias were  discussed today with Courtney [name] and     she is willing to proceed, and I will set her up for followup in 2     to 3 months to repeat her iron studies at that time. 2. History of coronary artery disease.  She is also on aspirin and     Plavix.  As mentioned, if she does have any occult GI blood losses,     I will defer her to her primary care, as well as the cardiologist     to maybe address the combination if antiplatelet agents could be     exacerbating occult bleeding at this time.    ______________________________ Benjiman Core, M.D. FNS/MEDQ  D:  04/05/2011  T:  04/05/2011  Job:  [id number]

## 2011-04-05 NOTE — Telephone Encounter (Signed)
Called pt,left message regarding IV Iron on 12/7

## 2011-04-05 NOTE — Progress Notes (Signed)
Note Dictated

## 2011-04-07 ENCOUNTER — Ambulatory Visit (HOSPITAL_BASED_OUTPATIENT_CLINIC_OR_DEPARTMENT_OTHER): Payer: Medicare Other

## 2011-04-07 ENCOUNTER — Encounter (HOSPITAL_COMMUNITY): Payer: Medicare Other

## 2011-04-07 VITALS — BP 171/75 | HR 100 | Temp 97.6°F

## 2011-04-07 DIAGNOSIS — D509 Iron deficiency anemia, unspecified: Secondary | ICD-10-CM

## 2011-04-07 DIAGNOSIS — D649 Anemia, unspecified: Secondary | ICD-10-CM

## 2011-04-07 MED ORDER — FERUMOXYTOL INJECTION 510 MG/17 ML
1020.0000 mg | Freq: Once | INTRAVENOUS | Status: AC
  Administered 2011-04-07: 1020 mg via INTRAVENOUS
  Filled 2011-04-07: qty 34

## 2011-04-07 MED ORDER — SODIUM CHLORIDE 0.9 % IV SOLN
Freq: Once | INTRAVENOUS | Status: AC
  Administered 2011-04-07: 15:00:00 via INTRAVENOUS

## 2011-04-10 ENCOUNTER — Encounter (HOSPITAL_COMMUNITY): Payer: Medicare Other

## 2011-04-12 ENCOUNTER — Encounter (HOSPITAL_COMMUNITY): Payer: Medicare Other

## 2011-04-14 ENCOUNTER — Encounter (HOSPITAL_COMMUNITY): Payer: Medicare Other

## 2011-04-17 ENCOUNTER — Encounter (HOSPITAL_COMMUNITY): Payer: Medicare Other

## 2011-04-19 ENCOUNTER — Encounter (HOSPITAL_COMMUNITY): Payer: Medicare Other

## 2011-04-21 ENCOUNTER — Encounter (HOSPITAL_COMMUNITY): Payer: Medicare Other

## 2011-04-24 ENCOUNTER — Encounter (HOSPITAL_COMMUNITY): Payer: Medicare Other

## 2011-04-26 ENCOUNTER — Encounter (HOSPITAL_COMMUNITY): Payer: Medicare Other

## 2011-04-28 ENCOUNTER — Encounter (HOSPITAL_COMMUNITY): Payer: Medicare Other

## 2011-05-01 ENCOUNTER — Encounter (HOSPITAL_COMMUNITY): Payer: Medicare Other

## 2011-05-03 ENCOUNTER — Encounter (HOSPITAL_COMMUNITY): Payer: Medicare Other

## 2011-05-05 ENCOUNTER — Encounter (HOSPITAL_COMMUNITY): Payer: Medicare Other

## 2011-05-08 ENCOUNTER — Encounter (HOSPITAL_COMMUNITY): Payer: Medicare Other

## 2011-05-10 ENCOUNTER — Encounter (HOSPITAL_COMMUNITY): Payer: Medicare Other

## 2011-05-12 ENCOUNTER — Encounter (HOSPITAL_COMMUNITY): Payer: Medicare Other

## 2011-05-15 ENCOUNTER — Encounter (HOSPITAL_COMMUNITY): Payer: Medicare Other

## 2011-05-17 ENCOUNTER — Encounter (HOSPITAL_COMMUNITY): Payer: Medicare Other

## 2011-05-19 ENCOUNTER — Encounter (HOSPITAL_COMMUNITY): Payer: Medicare Other

## 2011-05-22 ENCOUNTER — Encounter (HOSPITAL_COMMUNITY): Payer: Medicare Other

## 2011-05-24 ENCOUNTER — Encounter (HOSPITAL_COMMUNITY): Payer: Medicare Other

## 2011-05-26 ENCOUNTER — Encounter (HOSPITAL_COMMUNITY): Payer: Medicare Other

## 2011-05-29 ENCOUNTER — Encounter (HOSPITAL_COMMUNITY): Payer: Medicare Other

## 2011-05-31 ENCOUNTER — Encounter (HOSPITAL_COMMUNITY): Payer: Medicare Other

## 2011-06-02 ENCOUNTER — Encounter (HOSPITAL_COMMUNITY): Payer: Medicare Other

## 2011-06-05 ENCOUNTER — Encounter (HOSPITAL_COMMUNITY): Payer: Medicare Other

## 2011-06-07 ENCOUNTER — Encounter (HOSPITAL_COMMUNITY): Payer: Medicare Other

## 2011-06-09 ENCOUNTER — Encounter (HOSPITAL_COMMUNITY): Payer: Medicare Other

## 2011-06-12 ENCOUNTER — Encounter (HOSPITAL_COMMUNITY): Payer: Medicare Other

## 2011-06-13 ENCOUNTER — Emergency Department (HOSPITAL_COMMUNITY)
Admission: EM | Admit: 2011-06-13 | Discharge: 2011-06-13 | Discharge status: Against Medical Advice | Payer: Medicare Other | Attending: Emergency Medicine | Admitting: Emergency Medicine

## 2011-06-13 ENCOUNTER — Encounter (HOSPITAL_COMMUNITY): Payer: Self-pay

## 2011-06-13 ENCOUNTER — Other Ambulatory Visit: Payer: Self-pay

## 2011-06-13 DIAGNOSIS — R079 Chest pain, unspecified: Secondary | ICD-10-CM | POA: Insufficient documentation

## 2011-06-13 HISTORY — DX: Anemia, unspecified: D64.9

## 2011-06-13 HISTORY — DX: Acute myocardial infarction, unspecified: I21.9

## 2011-06-13 HISTORY — DX: Essential (primary) hypertension: I10

## 2011-06-13 NOTE — ED Notes (Signed)
Pt states she is diabetic and hasn't eaten since this am.  Pt is ambulating in waiting room free of distress but has been told her CBG will be checked for her.

## 2011-06-13 NOTE — ED Notes (Signed)
Patient reports that she got up during the night and became weak and fell. Reports that she caught herself and denies injury. Requests check-up, alert and oriented,no discomfort

## 2011-06-13 NOTE — ED Notes (Signed)
Pt has decided to leave after triage, prior to medical screening by MD, pt made aware of risk involved, pt verbalizes understanding

## 2011-06-14 ENCOUNTER — Encounter (HOSPITAL_COMMUNITY): Payer: Medicare Other

## 2011-06-14 LAB — GLUCOSE, CAPILLARY: Glucose-Capillary: 112 mg/dL — ABNORMAL HIGH (ref 70–99)

## 2011-06-16 ENCOUNTER — Encounter (HOSPITAL_COMMUNITY): Payer: Medicare Other

## 2011-06-19 ENCOUNTER — Encounter (HOSPITAL_COMMUNITY): Payer: Medicare Other

## 2011-06-21 ENCOUNTER — Encounter (HOSPITAL_COMMUNITY): Payer: Medicare Other

## 2011-06-23 ENCOUNTER — Encounter (HOSPITAL_COMMUNITY): Payer: Medicare Other

## 2011-06-26 ENCOUNTER — Encounter (HOSPITAL_COMMUNITY): Payer: Medicare Other

## 2011-06-28 ENCOUNTER — Encounter (HOSPITAL_COMMUNITY): Payer: Medicare Other

## 2011-06-30 ENCOUNTER — Encounter (HOSPITAL_COMMUNITY): Payer: Medicare Other

## 2011-07-04 ENCOUNTER — Ambulatory Visit: Payer: Medicare Other | Admitting: Oncology

## 2011-07-04 ENCOUNTER — Other Ambulatory Visit: Payer: Medicare Other | Admitting: Lab

## 2011-07-05 ENCOUNTER — Encounter: Payer: Self-pay | Admitting: *Deleted

## 2011-08-16 ENCOUNTER — Telehealth: Payer: Self-pay | Admitting: *Deleted

## 2011-08-16 ENCOUNTER — Other Ambulatory Visit: Payer: Self-pay | Admitting: Oncology

## 2011-08-16 ENCOUNTER — Encounter: Payer: Self-pay | Admitting: *Deleted

## 2011-08-16 DIAGNOSIS — D509 Iron deficiency anemia, unspecified: Secondary | ICD-10-CM

## 2011-08-16 NOTE — Telephone Encounter (Signed)
Patient calling to c/o feeling hot then cold, has not been eating ice, is tired, legs get numb when walking and right heel hurts if she is not wearing bedroom slippers, after taking a losartan one night, woke up and head was spinning, and she vomited several times., per dr Clelia Croft, this is not related to her blood and she should call her pcp, appt set up with [facility] for 4/23, patient verbalizes understanding.

## 2011-08-17 ENCOUNTER — Telehealth: Payer: Self-pay | Admitting: Oncology

## 2011-08-17 NOTE — Telephone Encounter (Signed)
Lmonvm for pt re appt for 4/23 @ 2:15 pm.

## 2011-08-22 ENCOUNTER — Ambulatory Visit (HOSPITAL_BASED_OUTPATIENT_CLINIC_OR_DEPARTMENT_OTHER): Payer: Medicare Other | Admitting: Oncology

## 2011-08-22 ENCOUNTER — Encounter: Payer: Self-pay | Admitting: Oncology

## 2011-08-22 ENCOUNTER — Telehealth: Payer: Self-pay | Admitting: Oncology

## 2011-08-22 ENCOUNTER — Other Ambulatory Visit (HOSPITAL_BASED_OUTPATIENT_CLINIC_OR_DEPARTMENT_OTHER): Payer: Medicare Other | Admitting: Lab

## 2011-08-22 VITALS — BP 145/75 | HR 86 | Temp 96.9°F | Ht 62.0 in | Wt 217.5 lb

## 2011-08-22 DIAGNOSIS — D509 Iron deficiency anemia, unspecified: Secondary | ICD-10-CM

## 2011-08-22 LAB — CBC WITH DIFFERENTIAL/PLATELET
BASO%: 0.6 % (ref 0.0–2.0)
HCT: 44.8 % (ref 34.8–46.6)
MCHC: 32.5 g/dL (ref 31.5–36.0)
MONO#: 0.5 10*3/uL (ref 0.1–0.9)
NEUT%: 50.3 % (ref 38.4–76.8)
RBC: 5.4 10*6/uL (ref 3.70–5.45)
WBC: 7.8 10*3/uL (ref 3.9–10.3)
lymph#: 3.2 10*3/uL (ref 0.9–3.3)
nRBC: 0 % (ref 0–0)

## 2011-08-22 LAB — COMPREHENSIVE METABOLIC PANEL
ALT: 17 U/L (ref 0–35)
AST: 19 U/L (ref 0–37)
Albumin: 4.6 g/dL (ref 3.5–5.2)
Calcium: 9.9 mg/dL (ref 8.4–10.5)
Chloride: 103 mEq/L (ref 96–112)
Potassium: 4.2 mEq/L (ref 3.5–5.3)
Total Protein: 7.2 g/dL (ref 6.0–8.3)

## 2011-08-22 NOTE — Progress Notes (Signed)
Hematology and Oncology Follow Up Visit  TALISE SLIGH [id number] 04-Jul-1945 66 y.o. 08/22/2011 4:07 PM Maryelizabeth Rowan, MD, MDDewey, [name], MD   Principle Diagnosis: Iron deficiency anemia  Prior Therapy: She received IV Feraheme in December 2012.  Current therapy: Watchful observation.  Interim History:  The patient is seen for routine followup. The clinic by herself. Oral and doing well in regards to her anemia. Her energy level has improved and since she has received her Feraheme. He denies any fevers chills or night sweats. No chest pain. No shortness of breath or dyspnea on exertion other than her baseline. She is no longer craving ice and does not have a metallic taste in her mouth anymore.  Medications: I have reviewed the patient's current medications. Current outpatient prescriptions:albuterol (PROVENTIL HFA;VENTOLIN HFA) 108 (90 BASE) MCG/ACT inhaler, Inhale 2 puffs into the lungs every 6 (six) hours as needed. For shortness of breath, Disp: , Rfl: ;  amLODipine (NORVASC) 2.5 MG tablet, Take 2.5 mg by mouth daily.  , Disp: , Rfl: ;  atorvastatin (LIPITOR) 40 MG tablet, Take 40 mg by mouth daily. , Disp: , Rfl:  budesonide-formoterol (SYMBICORT) 160-4.5 MCG/ACT inhaler, Inhale 2 puffs into the lungs 2 (two) times daily., Disp: , Rfl: ;  Calcium Carbonate-Vitamin D (CALTRATE 600+D) 600-400 MG-UNIT per chew tablet, Chew 1 tablet by mouth 2 (two) times daily.  , Disp: , Rfl: ;  cholecalciferol (VITAMIN D) 1000 UNITS tablet, Take 2,000 Units by mouth daily.  , Disp: , Rfl: ;  clopidogrel (PLAVIX) 75 MG tablet, Take 75 mg by mouth daily.  , Disp: , Rfl:  docusate sodium (COLACE) 100 MG capsule, Take 100 mg by mouth 2 (two) times daily. , Disp: , Rfl: ;  furosemide (LASIX) 40 MG tablet, Take 20 mg by mouth daily., Disp: , Rfl: ;  gabapentin (NEURONTIN) 300 MG capsule, Take 300 mg by mouth at bedtime.  , Disp: , Rfl: ;  losartan (COZAAR) 100 MG tablet, Take 100 mg by mouth daily.  , Disp: ,  Rfl:  metFORMIN (GLUCOPHAGE) 500 MG tablet, Take 500 mg by mouth 2 (two) times daily with a meal.  , Disp: , Rfl: ;  metoprolol succinate (TOPROL-XL) 25 MG 24 hr tablet, Take 25 mg by mouth daily. , Disp: , Rfl: ;  nitroGLYCERIN (NITROSTAT) 0.4 MG SL tablet, Place 0.4 mg under the tongue every 5 (five) minutes as needed.  , Disp: , Rfl:   Allergies:  Allergies  Allergen Reactions  . Penicillins     Past Medical History, Surgical history, Social history, and Family History were reviewed and updated.  Review of Systems: Constitutional:  Negative for fever, chills, night sweats, anorexia, weight loss, pain. Cardiovascular: no chest pain or dyspnea on exertion Respiratory: no cough, shortness of breath, or wheezing Neurological: no TIA or stroke symptoms Dermatological: negative ENT: negative Skin: Negative. Gastrointestinal: no abdominal pain, change in bowel habits, or black or bloody stools Genito-Urinary: no dysuria, trouble voiding, or hematuria Hematological and Lymphatic: negative Breast: negative for breast lumps Musculoskeletal: negative Remaining ROS negative. Physical Exam: Blood pressure 145/75, pulse 86, temperature 96.9 F (36.1 C), temperature source Oral, height 5\' 2"  (1.575 m), weight 217 lb 8 oz (98.657 kg). ECOG:  General appearance: alert, cooperative and no distress Head: Normocephalic, without obvious abnormality, atraumatic Neck: no adenopathy, no carotid bruit, no JVD, supple, symmetrical, trachea midline and thyroid not enlarged, symmetric, no tenderness/mass/nodules Lymph nodes: Cervical, supraclavicular, and axillary nodes normal. Heart:regular rate and rhythm, S1, S2  normal, no murmur, click, rub or gallop Lung:no tachypnea, retractions or cyanosis, mild expiratory wheezing heard diffusely throughout both lungs Abdomen: soft, non-tender, without masses or organomegaly EXT:no erythema, induration, or nodules   Lab Results: Lab Results  Component Value  Date   WBC 7.8 08/22/2011   HGB 14.6 08/22/2011   HCT 44.8 08/22/2011   MCV 83.1 08/22/2011   PLT 221 08/22/2011     Chemistry      Component Value Date/Time   NA 135 02/13/2010 0350   K 3.5 02/13/2010 0350   CL 103 02/13/2010 0350   CO2 20 02/13/2010 0350   BUN 10 02/13/2010 0350   CREATININE 0.66 02/13/2010 0350      Component Value Date/Time   CALCIUM 9.0 02/13/2010 0350   ALKPHOS 111 02/12/2010 0500   AST 34 02/12/2010 0500   ALT 21 02/12/2010 0500   BILITOT 0.3 02/12/2010 0500     Impression and Plan: This is a 66 year old female with the following issues: 1. Iron deficiency anemia: Her hemoglobin has now corrected to 14.6. Her ferritin level is pending today. She is not currently on any oral ferrous sulfate, and we can contact her with the results of her ferritin level if she needs to begin oral ferrous sulfate. We will continue to monitor her CBC and iron studies periodically. We may need to consider giving her IV Feraheme in the future. 2. Coronary artery disease: She has now stopped her aspirin, but remains on Plavix. She will continue to follow up with cardiology. 3. COPD: On albuterol and Symbicort per primary care provider. She will continue to follow with her PCP. 4. Tobacco dependence: The patient continues to smoke one half pack per day of cigarettes. She is not interested in smoking cessation at this time. 5. Follow up: In 4 months with a CBC and iron studies.  Case discussed with Dr Clelia Croft. Spent more than half the time coordinating care.    Clenton Pare 4/23/20134:07 PM

## 2011-08-22 NOTE — Telephone Encounter (Signed)
appts made and printed for pt aom °

## 2011-09-21 ENCOUNTER — Encounter: Payer: Self-pay | Admitting: Cardiology

## 2011-12-14 ENCOUNTER — Telehealth: Payer: Self-pay | Admitting: Oncology

## 2011-12-14 NOTE — Telephone Encounter (Signed)
Moved 8/23 appt to 9/27. Lm for pt and mailed schedule.

## 2011-12-22 ENCOUNTER — Other Ambulatory Visit: Payer: Medicare Other | Admitting: Lab

## 2011-12-22 ENCOUNTER — Ambulatory Visit: Payer: Medicare Other | Admitting: Oncology

## 2012-01-26 ENCOUNTER — Telehealth: Payer: Self-pay | Admitting: Oncology

## 2012-01-26 ENCOUNTER — Ambulatory Visit (HOSPITAL_BASED_OUTPATIENT_CLINIC_OR_DEPARTMENT_OTHER): Payer: Medicare Other | Admitting: Oncology

## 2012-01-26 ENCOUNTER — Other Ambulatory Visit (HOSPITAL_BASED_OUTPATIENT_CLINIC_OR_DEPARTMENT_OTHER): Payer: Medicare Other | Admitting: Lab

## 2012-01-26 VITALS — BP 175/91 | HR 75 | Temp 97.2°F | Resp 22 | Ht 62.0 in | Wt 231.0 lb

## 2012-01-26 DIAGNOSIS — F17200 Nicotine dependence, unspecified, uncomplicated: Secondary | ICD-10-CM

## 2012-01-26 DIAGNOSIS — D509 Iron deficiency anemia, unspecified: Secondary | ICD-10-CM

## 2012-01-26 LAB — COMPREHENSIVE METABOLIC PANEL (CC13)
ALT: 18 U/L (ref 0–55)
AST: 17 U/L (ref 5–34)
Alkaline Phosphatase: 165 U/L — ABNORMAL HIGH (ref 40–150)
Creatinine: 0.8 mg/dL (ref 0.6–1.1)
Total Bilirubin: 0.4 mg/dL (ref 0.20–1.20)

## 2012-01-26 LAB — CBC WITH DIFFERENTIAL/PLATELET
Basophils Absolute: 0 10*3/uL (ref 0.0–0.1)
Eosinophils Absolute: 0.1 10*3/uL (ref 0.0–0.5)
HGB: 14.3 g/dL (ref 11.6–15.9)
MONO#: 0.5 10*3/uL (ref 0.1–0.9)
NEUT#: 4.2 10*3/uL (ref 1.5–6.5)
RBC: 5.2 10*6/uL (ref 3.70–5.45)
RDW: 14.8 % — ABNORMAL HIGH (ref 11.2–14.5)
WBC: 8.5 10*3/uL (ref 3.9–10.3)

## 2012-01-26 NOTE — Progress Notes (Signed)
Hematology and Oncology Follow Up Visit  MALEIGH BAGOT [id number] February 12, 1946 66 y.o. 01/26/2012 1:27 PM DEWEY,ELIZABETH, MDDewey, [name], MD   Principle Diagnosis: 66 year old Iron deficiency anemia  Prior Therapy: She received IV Feraheme in December 2012.  Current therapy: Oral Fe supplement daily.   Interim History:  Ms. Bills returns todau for routine followup. She is taking oral De and doing well in regards to her anemia. Her energy level has improved and since she has received her Feraheme. He denies any fevers chills or night sweats. No chest pain. No shortness of breath or dyspnea on exertion other than her baseline. She is no longer craving ice and does not have a metallic taste in her mouth anymore. She tolerating oral Fe without complications.   Medications: I have reviewed the patient's current medications. Current outpatient prescriptions:albuterol (PROVENTIL HFA;VENTOLIN HFA) 108 (90 BASE) MCG/ACT inhaler, Inhale 2 puffs into the lungs every 6 (six) hours as needed. For shortness of breath, Disp: , Rfl: ;  amLODipine (NORVASC) 2.5 MG tablet, Take 2.5 mg by mouth daily.  , Disp: , Rfl: ;  atorvastatin (LIPITOR) 40 MG tablet, Take 40 mg by mouth daily. , Disp: , Rfl:  budesonide-formoterol (SYMBICORT) 160-4.5 MCG/ACT inhaler, Inhale 2 puffs into the lungs 2 (two) times daily., Disp: , Rfl: ;  Calcium Carbonate-Vitamin D (CALTRATE 600+D) 600-400 MG-UNIT per chew tablet, Chew 1 tablet by mouth 2 (two) times daily.  , Disp: , Rfl: ;  cholecalciferol (VITAMIN D) 1000 UNITS tablet, Take 2,000 Units by mouth daily.  , Disp: , Rfl: ;  clopidogrel (PLAVIX) 75 MG tablet, Take 75 mg by mouth daily.  , Disp: , Rfl:  docusate sodium (COLACE) 100 MG capsule, Take 100 mg by mouth 2 (two) times daily. , Disp: , Rfl: ;  furosemide (LASIX) 40 MG tablet, Take 20 mg by mouth daily., Disp: , Rfl: ;  gabapentin (NEURONTIN) 300 MG capsule, Take 300 mg by mouth at bedtime.  , Disp: , Rfl: ;  losartan  (COZAAR) 100 MG tablet, Take 100 mg by mouth daily.  , Disp: , Rfl:  metFORMIN (GLUCOPHAGE) 500 MG tablet, Take 500 mg by mouth 2 (two) times daily with a meal.  , Disp: , Rfl: ;  metoprolol succinate (TOPROL-XL) 25 MG 24 hr tablet, Take 25 mg by mouth daily. , Disp: , Rfl: ;  nitroGLYCERIN (NITROSTAT) 0.4 MG SL tablet, Place 0.4 mg under the tongue every 5 (five) minutes as needed.  , Disp: , Rfl:   Allergies:  Allergies  Allergen Reactions  . Penicillins     Past Medical History, Surgical history, Social history, and Family History were reviewed and updated.  Review of Systems: Constitutional:  Negative for fever, chills, night sweats, anorexia, weight loss, pain. Cardiovascular: no chest pain or dyspnea on exertion Respiratory: no cough, shortness of breath, or wheezing Neurological: no TIA or stroke symptoms Dermatological: negative ENT: negative Skin: Negative. Gastrointestinal: no abdominal pain, change in bowel habits, or black or bloody stools Genito-Urinary: no dysuria, trouble voiding, or hematuria Hematological and Lymphatic: negative Breast: negative for breast lumps Musculoskeletal: negative Remaining ROS negative. Physical Exam: Blood pressure 175/91, pulse 75, temperature 97.2 F (36.2 C), temperature source Oral, resp. rate 22, height 5\' 2"  (1.575 m), weight 231 lb (104.781 kg). ECOG:  General appearance: alert, cooperative and no distress Head: Normocephalic, without obvious abnormality, atraumatic Neck: no adenopathy, no carotid bruit, no JVD, supple, symmetrical, trachea midline and thyroid not enlarged, symmetric, no tenderness/mass/nodules Lymph  nodes: Cervical, supraclavicular, and axillary nodes normal. Heart:regular rate and rhythm, S1, S2 normal, no murmur, click, rub or gallop Lung:no tachypnea, retractions or cyanosis, mild expiratory wheezing heard diffusely throughout both lungs Abdomen: soft, non-tender, without masses or organomegaly EXT:no erythema,  induration, or nodules   Lab Results: Lab Results  Component Value Date   WBC 8.5 01/26/2012   HGB 14.3 01/26/2012   HCT 43.6 01/26/2012   MCV 83.8 01/26/2012   PLT 181 01/26/2012     Chemistry      Component Value Date/Time   NA 142 08/22/2011 1426   K 4.2 08/22/2011 1426   CL 103 08/22/2011 1426   CO2 27 08/22/2011 1426   BUN 11 08/22/2011 1426   CREATININE 0.70 08/22/2011 1426      Component Value Date/Time   CALCIUM 9.9 08/22/2011 1426   ALKPHOS 156* 08/22/2011 1426   AST 19 08/22/2011 1426   ALT 17 08/22/2011 1426   BILITOT 0.4 08/22/2011 1426     Results for AYARI, LEANO (MRN [id number]) as of 01/26/2012 13:30  Ref. Range 04/05/2011 11:18 08/22/2011 14:26  Ferritin Latest Range: 10-291 ng/mL 3 (L) 22   Impression and Plan: This is a 66 year old female with the following issues: 1. Iron deficiency anemia: Her hemoglobin has now corrected to 14.3. Her ferritin level is pending today. She is currently on any oral ferrous sulfate and doing well. The plan is to check her counts in 6 months. If her Hgb and Fe levels stay stable, then she will follow up with [place] as needed 2. Coronary artery disease: She has now stopped her aspirin, but remains on Plavix. She will continue to follow up with cardiology. 3. COPD: On albuterol and Symbicort per primary care provider. She will continue to follow with her PCP. 4. Tobacco dependence: The patient continues to smoke one half pack per day of cigarettes. She is not interested in smoking cessation at this time. 5. Follow up: In 6 months with a CBC and iron studies.    [facility] 9/27/20131:27 PM

## 2012-01-26 NOTE — Telephone Encounter (Signed)
gv and printed appt for pt. °

## 2012-01-27 LAB — IRON AND TIBC
%SAT: 27 % (ref 20–55)
Iron: 121 ug/dL (ref 42–145)
TIBC: 443 ug/dL (ref 250–470)
UIBC: 322 ug/dL (ref 125–400)

## 2012-01-27 LAB — FERRITIN: Ferritin: 16 ng/mL (ref 10–291)

## 2012-03-13 ENCOUNTER — Other Ambulatory Visit: Payer: Self-pay | Admitting: Family Medicine

## 2012-03-13 DIAGNOSIS — Z1231 Encounter for screening mammogram for malignant neoplasm of breast: Secondary | ICD-10-CM

## 2012-03-13 DIAGNOSIS — Z780 Asymptomatic menopausal state: Secondary | ICD-10-CM

## 2012-04-22 ENCOUNTER — Other Ambulatory Visit: Payer: Medicare Other

## 2012-04-22 ENCOUNTER — Ambulatory Visit: Payer: Medicare Other

## 2012-05-29 ENCOUNTER — Other Ambulatory Visit: Payer: Medicare Other

## 2012-05-29 ENCOUNTER — Ambulatory Visit: Payer: Medicare Other

## 2012-06-24 ENCOUNTER — Ambulatory Visit: Payer: Medicare Other

## 2012-06-24 ENCOUNTER — Other Ambulatory Visit: Payer: Medicare Other

## 2012-07-25 ENCOUNTER — Ambulatory Visit (HOSPITAL_BASED_OUTPATIENT_CLINIC_OR_DEPARTMENT_OTHER): Payer: Medicare Other | Admitting: Oncology

## 2012-07-25 ENCOUNTER — Telehealth: Payer: Self-pay | Admitting: Oncology

## 2012-07-25 ENCOUNTER — Other Ambulatory Visit (HOSPITAL_BASED_OUTPATIENT_CLINIC_OR_DEPARTMENT_OTHER): Payer: Medicare Other | Admitting: Lab

## 2012-07-25 VITALS — BP 150/78 | HR 86 | Temp 96.8°F | Resp 20 | Ht 62.0 in | Wt 239.2 lb

## 2012-07-25 DIAGNOSIS — F17200 Nicotine dependence, unspecified, uncomplicated: Secondary | ICD-10-CM

## 2012-07-25 DIAGNOSIS — J4489 Other specified chronic obstructive pulmonary disease: Secondary | ICD-10-CM

## 2012-07-25 DIAGNOSIS — J449 Chronic obstructive pulmonary disease, unspecified: Secondary | ICD-10-CM

## 2012-07-25 DIAGNOSIS — D509 Iron deficiency anemia, unspecified: Secondary | ICD-10-CM

## 2012-07-25 LAB — CBC WITH DIFFERENTIAL/PLATELET
BASO%: 0.4 % (ref 0.0–2.0)
EOS%: 1.4 % (ref 0.0–7.0)
LYMPH%: 38.7 % (ref 14.0–49.7)
MCH: 27.1 pg (ref 25.1–34.0)
MCHC: 32.5 g/dL (ref 31.5–36.0)
MCV: 83.4 fL (ref 79.5–101.0)
MONO%: 7 % (ref 0.0–14.0)
Platelets: 196 10*3/uL (ref 145–400)
RBC: 5.16 10*6/uL (ref 3.70–5.45)
RDW: 14.7 % — ABNORMAL HIGH (ref 11.2–14.5)

## 2012-07-25 NOTE — Progress Notes (Signed)
Hematology and Oncology Follow Up Visit  Courtney [name] [id number] [birth year] 67 y.o. 07/25/2012 9:35 AM Courtney [name], MDDewey, Courtney Manis, MD   Principle Diagnosis: 68 year old women with Iron deficiency anemia  Prior Therapy: She received IV Feraheme in December 2012.  Current therapy: Oral Fe supplement daily.   Interim History:  Courtney [name] returns todau for routine followup. She is taking oral Fe and doing well in regards to her anemia. Her energy level has improved and since she has received her Feraheme. He denies any fevers chills or night sweats. No chest pain. No shortness of breath or dyspnea on exertion other than her baseline. She is no longer craving ice and does not have a metallic taste in her mouth anymore. She tolerating oral Fe without complications. No bleeding noted at this time.   Medications: I have reviewed the patient's current medications. Current outpatient prescriptions:albuterol (PROVENTIL HFA;VENTOLIN HFA) 108 (90 BASE) MCG/ACT inhaler, Inhale 2 puffs into the lungs every 6 (six) hours as needed. For shortness of breath, Disp: , Rfl: ;  amLODipine (NORVASC) 2.5 MG tablet, Take 2.5 mg by mouth daily.  , Disp: , Rfl: ;  atorvastatin (LIPITOR) 40 MG tablet, Take 40 mg by mouth daily. , Disp: , Rfl:  budesonide-formoterol (SYMBICORT) 160-4.5 MCG/ACT inhaler, Inhale 2 puffs into the lungs 2 (two) times daily., Disp: , Rfl: ;  Calcium Carbonate-Vitamin D (CALTRATE 600+D) 600-400 MG-UNIT per chew tablet, Chew 1 tablet by mouth 2 (two) times daily.  , Disp: , Rfl: ;  cholecalciferol (VITAMIN D) 1000 UNITS tablet, Take 2,000 Units by mouth daily.  , Disp: , Rfl: ;  clopidogrel (PLAVIX) 75 MG tablet, Take 75 mg by mouth daily.  , Disp: , Rfl:  docusate sodium (COLACE) 100 MG capsule, Take 100 mg by mouth 2 (two) times daily. , Disp: , Rfl: ;  furosemide (LASIX) 40 MG tablet, Take 20 mg by mouth daily., Disp: , Rfl: ;  losartan (COZAAR) 100 MG tablet, Take 100 mg by mouth daily.  ,  Disp: , Rfl: ;  metFORMIN (GLUCOPHAGE) 500 MG tablet, Take 500 mg by mouth 2 (two) times daily with a meal.  , Disp: , Rfl:  metoprolol succinate (TOPROL-XL) 25 MG 24 hr tablet, Take 25 mg by mouth daily. , Disp: , Rfl: ;  nitroGLYCERIN (NITROSTAT) 0.4 MG SL tablet, Place 0.4 mg under the tongue every 5 (five) minutes as needed.  , Disp: , Rfl:   Allergies:  Allergies  Allergen Reactions  . Penicillins     Past Medical History, Surgical history, Social history, and Family History were reviewed and updated.  Review of Systems: Constitutional:  Negative for fever, chills, night sweats, anorexia, weight loss, pain. Cardiovascular: no chest pain or dyspnea on exertion Respiratory: no cough, shortness of breath, or wheezing Neurological: no TIA or stroke symptoms Dermatological: negative ENT: negative Skin: Negative. Gastrointestinal: no abdominal pain, change in bowel habits, or black or bloody stools Genito-Urinary: no dysuria, trouble voiding, or hematuria Hematological and Lymphatic: negative Breast: negative for breast lumps Musculoskeletal: negative Remaining ROS negative. Physical Exam: Blood pressure 150/78, pulse 86, temperature 96.8 F (36 C), temperature source Oral, resp. rate 20, height 5\' 2"  (1.575 m), weight 239 lb 3.2 oz (108.5 kg). ECOG: 1 General appearance: alert, cooperative and no distress Head: Normocephalic, without obvious abnormality, atraumatic Neck: no adenopathy, no carotid bruit, no JVD, supple, symmetrical, trachea midline and thyroid not enlarged, symmetric, no tenderness/mass/nodules Lymph nodes: Cervical, supraclavicular, and axillary nodes normal. Heart:regular rate  and rhythm, S1, S2 normal, no murmur, click, rub or gallop Lung:no tachypnea, retractions or cyanosis, mild expiratory wheezing heard diffusely throughout both lungs Abdomen: soft, non-tender, without masses or organomegaly EXT:no erythema, induration, or nodules   Lab Results: Lab  Results  Component Value Date   WBC 7.4 07/25/2012   HGB 14.0 07/25/2012   HCT 43.0 07/25/2012   MCV 83.4 07/25/2012   PLT 196 07/25/2012     Chemistry      Component Value Date/Time   NA 140 01/26/2012 1242   NA 142 08/22/2011 1426   K 3.8 01/26/2012 1242   K 4.2 08/22/2011 1426   CL 103 01/26/2012 1242   CL 103 08/22/2011 1426   CO2 26 01/26/2012 1242   CO2 27 08/22/2011 1426   BUN 13.0 01/26/2012 1242   BUN 11 08/22/2011 1426   CREATININE 0.8 01/26/2012 1242   CREATININE 0.70 08/22/2011 1426      Component Value Date/Time   CALCIUM 10.2 01/26/2012 1242   CALCIUM 9.9 08/22/2011 1426   ALKPHOS 165* 01/26/2012 1242   ALKPHOS 156* 08/22/2011 1426   AST 17 01/26/2012 1242   AST 19 08/22/2011 1426   ALT 18 01/26/2012 1242   ALT 17 08/22/2011 1426   BILITOT 0.40 01/26/2012 1242   BILITOT 0.4 08/22/2011 1426       Impression and Plan: This is a 67 year old female with the following issues: 1. Iron deficiency anemia: Her hemoglobin has now corrected to 14.3. Her ferritin level is pending today. She is currently on any oral ferrous sulfate and doing well. The plan is to check her counts in 6 months. 2. Coronary artery disease: She has now stopped her aspirin, but remains on Plavix. She will continue to follow up with cardiology. 3. COPD: On albuterol and Symbicort per primary care provider. She will continue to follow with her PCP. 4. Tobacco dependence: The patient continues to smoke one half pack per day of cigarettes. She is not interested in smoking cessation at this time. 5. Follow up: In 6 months with a CBC and iron studies.    Courtney [name] 3/27/20149:35 AM

## 2012-07-25 NOTE — Telephone Encounter (Signed)
appts made and pritned for  Pt aom

## 2012-08-06 ENCOUNTER — Ambulatory Visit
Admission: RE | Admit: 2012-08-06 | Discharge: 2012-08-06 | Disposition: A | Discharge status: Home / Self Care | Payer: Medicare Other | Source: Ambulatory Visit | Attending: Family Medicine | Admitting: Family Medicine

## 2012-08-06 DIAGNOSIS — Z780 Asymptomatic menopausal state: Secondary | ICD-10-CM

## 2012-08-06 DIAGNOSIS — Z1231 Encounter for screening mammogram for malignant neoplasm of breast: Secondary | ICD-10-CM

## 2013-01-18 ENCOUNTER — Telehealth: Payer: Self-pay | Admitting: *Deleted

## 2013-01-18 NOTE — Telephone Encounter (Signed)
Lm as a reminder for the pt to come in on 01/22/13 w/labs  @ 9:15am and ov @ 9:45am...td

## 2013-01-22 ENCOUNTER — Other Ambulatory Visit (HOSPITAL_BASED_OUTPATIENT_CLINIC_OR_DEPARTMENT_OTHER): Payer: Medicare Other | Admitting: Lab

## 2013-01-22 ENCOUNTER — Encounter: Payer: Self-pay | Admitting: *Deleted

## 2013-01-22 ENCOUNTER — Ambulatory Visit (HOSPITAL_BASED_OUTPATIENT_CLINIC_OR_DEPARTMENT_OTHER): Payer: Medicare Other | Admitting: Physician Assistant

## 2013-01-22 VITALS — BP 146/67 | HR 103 | Temp 97.0°F | Resp 18 | Ht 62.0 in | Wt 247.7 lb

## 2013-01-22 DIAGNOSIS — J449 Chronic obstructive pulmonary disease, unspecified: Secondary | ICD-10-CM

## 2013-01-22 DIAGNOSIS — F17200 Nicotine dependence, unspecified, uncomplicated: Secondary | ICD-10-CM

## 2013-01-22 DIAGNOSIS — D509 Iron deficiency anemia, unspecified: Secondary | ICD-10-CM

## 2013-01-22 DIAGNOSIS — E119 Type 2 diabetes mellitus without complications: Secondary | ICD-10-CM

## 2013-01-22 DIAGNOSIS — M25569 Pain in unspecified knee: Secondary | ICD-10-CM

## 2013-01-22 LAB — COMPREHENSIVE METABOLIC PANEL (CC13)
ALT: 22 U/L (ref 0–55)
Albumin: 3.4 g/dL — ABNORMAL LOW (ref 3.5–5.0)
Alkaline Phosphatase: 161 U/L — ABNORMAL HIGH (ref 40–150)
CO2: 26 mEq/L (ref 22–29)
Glucose: 159 mg/dl — ABNORMAL HIGH (ref 70–140)
Potassium: 4.1 mEq/L (ref 3.5–5.1)
Sodium: 141 mEq/L (ref 136–145)
Total Protein: 7 g/dL (ref 6.4–8.3)

## 2013-01-22 LAB — CBC WITH DIFFERENTIAL/PLATELET
BASO%: 0.3 % (ref 0.0–2.0)
Eosinophils Absolute: 0.1 10*3/uL (ref 0.0–0.5)
MONO#: 0.5 10*3/uL (ref 0.1–0.9)
MONO%: 6.6 % (ref 0.0–14.0)
NEUT#: 4.5 10*3/uL (ref 1.5–6.5)
RBC: 5.19 10*6/uL (ref 3.70–5.45)
RDW: 14.6 % — ABNORMAL HIGH (ref 11.2–14.5)
WBC: 8.4 10*3/uL (ref 3.9–10.3)

## 2013-01-22 LAB — IRON AND TIBC CHCC: Iron: 59 ug/dL (ref 41–142)

## 2013-01-22 NOTE — Progress Notes (Signed)
Hematology and Oncology Follow Up Visit  Courtney [name] [id number] 04/08/1946 67 y.o. 01/22/2013 9:47 AM [name], MD  Principle Diagnosis: 66 year old women with Iron deficiency anemia  Prior Therapy: She received IV Feraheme in December 2012.  Current therapy: Oral Fe supplement daily.   Interim History:  Courtney [name] returns today for routine followup. She is taking oral Fe and doing well in regards to her anemia. She does admit to not taking the iron supplements on a regular daily basis. Her energy level has decreased somewhat and she complains of feeling cold. She has some intermittent problems with constipation but has been placed on Linzess by her gastroenterologist with some good results. Her new primary care physician Dr. Mardelle Matte has placed her on a new medication for her diabetes and taken her off of Glucophage. She does not recall the name of the new medication but will contact our office with that information. She complains of some difficulty walking related to some knee pain but voices no other complaints. He denies any fevers chills or night sweats. No chest pain. No shortness of breath or dyspnea on exertion other than her baseline. She denies any pica symptoms.  She tolerating oral Fe without complications. No bleeding noted at this time.   Medications: I have reviewed the patient's current medications. Current outpatient prescriptions:Linaclotide (LINZESS) 145 MCG CAPS capsule, Take 145 mcg by mouth daily., Disp: , Rfl: ;  albuterol (PROVENTIL HFA;VENTOLIN HFA) 108 (90 BASE) MCG/ACT inhaler, Inhale 2 puffs into the lungs every 6 (six) hours as needed. For shortness of breath, Disp: , Rfl: ;  amLODipine (NORVASC) 2.5 MG tablet, Take 2.5 mg by mouth daily.  , Disp: , Rfl:  atorvastatin (LIPITOR) 40 MG tablet, Take 40 mg by mouth daily. , Disp: , Rfl: ;  budesonide-formoterol (SYMBICORT) 160-4.5 MCG/ACT inhaler, Inhale 2 puffs into the lungs 2 (two) times daily., Disp: , Rfl: ;  Calcium  Carbonate-Vitamin D (CALTRATE 600+D) 600-400 MG-UNIT per chew tablet, Chew 1 tablet by mouth 2 (two) times daily.  , Disp: , Rfl: ;  cholecalciferol (VITAMIN D) 1000 UNITS tablet, Take 2,000 Units by mouth daily.  , Disp: , Rfl:  clopidogrel (PLAVIX) 75 MG tablet, Take 75 mg by mouth daily.  , Disp: , Rfl: ;  docusate sodium (COLACE) 100 MG capsule, Take 100 mg by mouth 2 (two) times daily. , Disp: , Rfl: ;  furosemide (LASIX) 40 MG tablet, Take 20 mg by mouth daily., Disp: , Rfl: ;  losartan (COZAAR) 100 MG tablet, Take 100 mg by mouth daily.  , Disp: , Rfl:  metFORMIN (GLUCOPHAGE) 500 MG tablet, Take 1,000 mg by mouth 2 (two) times daily with a meal. Patient also takes 500 mg at lunch, Disp: , Rfl: ;  metoprolol succinate (TOPROL-XL) 25 MG 24 hr tablet, Take 25 mg by mouth daily. , Disp: , Rfl: ;  nitroGLYCERIN (NITROSTAT) 0.4 MG SL tablet, Place 0.4 mg under the tongue every 5 (five) minutes as needed.  , Disp: , Rfl:   Allergies:  Allergies  Allergen Reactions  . Penicillins     Past Medical History, Surgical history, Social history, and Family History were reviewed and updated.  Review of Systems: Constitutional:  Negative for fever, chills, night sweats, anorexia, weight loss, pain. Cardiovascular: no chest pain or dyspnea on exertion Respiratory: no cough, shortness of breath, or wheezing Neurological: no TIA or stroke symptoms Dermatological: negative ENT: negative Skin: Negative. Gastrointestinal: no abdominal pain, change in bowel habits,  or black or bloody stools, positive for occasional constipation Genito-Urinary: no dysuria, trouble voiding, or hematuria Hematological and Lymphatic: negative Breast: negative for breast lumps Musculoskeletal: negative Remaining ROS negative. Physical Exam: Blood pressure 146/67, pulse 103, temperature 97 F (36.1 C), temperature source Oral, resp. rate 18, height 5\' 2"  (1.575 m), weight 247 lb 11.2 oz (112.356 kg). ECOG: 1 General  appearance: alert, cooperative and no distress Head: Normocephalic, without obvious abnormality, atraumatic Neck: no adenopathy, no carotid bruit, no JVD, supple, symmetrical, trachea midline and thyroid not enlarged, symmetric, no tenderness/mass/nodules Lymph nodes: Cervical, supraclavicular, and axillary nodes normal. Heart:regular rate and rhythm, S1, S2 normal, no murmur, click, rub or gallop Lung:no tachypnea, retractions or cyanosis, mild expiratory wheezing heard diffusely throughout both lungs, no rales or rhonchi present Abdomen: soft, non-tender, without masses or organomegaly EXT:no erythema, induration, or nodules   Lab Results: Lab Results  Component Value Date   WBC 8.4 01/22/2013   HGB 14.3 01/22/2013   HCT 42.6 01/22/2013   MCV 82.1 01/22/2013   PLT 168 01/22/2013     Chemistry      Component Value Date/Time   NA 141 01/22/2013 0845   NA 142 08/22/2011 1426   K 4.1 01/22/2013 0845   K 4.2 08/22/2011 1426   CL 103 01/26/2012 1242   CL 103 08/22/2011 1426   CO2 26 01/22/2013 0845   CO2 27 08/22/2011 1426   BUN 7.6 01/22/2013 0845   BUN 11 08/22/2011 1426   CREATININE 0.8 01/22/2013 0845   CREATININE 0.70 08/22/2011 1426      Component Value Date/Time   CALCIUM 10.0 01/22/2013 0845   CALCIUM 9.9 08/22/2011 1426   ALKPHOS 161* 01/22/2013 0845   ALKPHOS 156* 08/22/2011 1426   AST 17 01/22/2013 0845   AST 19 08/22/2011 1426   ALT 22 01/22/2013 0845   ALT 17 08/22/2011 1426   BILITOT 0.28 01/22/2013 0845   BILITOT 0.4 08/22/2011 1426       Impression and Plan: This is a 67 year old female with the following issues: 1. Iron deficiency anemia: Her hemoglobin has now corrected to 14.3. Her ferritin level is pending today. She is currently on any oral ferrous sulfate and doing well. The plan is to check her counts in 6 months. 2. Coronary artery disease: She has now stopped her aspirin, but remains on Plavix. She will continue to follow up with cardiology. 3. COPD: On albuterol and  Symbicort per primary care provider. She will continue to follow with her PCP. 4. Tobacco dependence: The patient continues to smoke one half pack per day of cigarettes. She is not interested in smoking cessation at this time. 5. Follow up: In 6 months with a CBC and iron studies.  Patient reviewed with Dr. Gerald Dexter, Camesha Farooq E, PA-C 9/24/20149:47 AM

## 2013-01-22 NOTE — Patient Instructions (Addendum)
Continue taking your oral iron tablets daily Followup in 6 months

## 2013-01-23 ENCOUNTER — Telehealth: Payer: Self-pay | Admitting: Oncology

## 2013-01-23 ENCOUNTER — Other Ambulatory Visit: Payer: Medicare Other | Admitting: Lab

## 2013-01-23 ENCOUNTER — Ambulatory Visit: Payer: Medicare Other | Admitting: Oncology

## 2013-01-23 NOTE — Telephone Encounter (Signed)
s.w. pt and advised on march 2015 appt...pt ok and aware

## 2013-02-17 ENCOUNTER — Other Ambulatory Visit: Payer: Self-pay | Admitting: *Deleted

## 2013-02-17 DIAGNOSIS — E782 Mixed hyperlipidemia: Secondary | ICD-10-CM

## 2013-02-17 DIAGNOSIS — Z79899 Other long term (current) drug therapy: Secondary | ICD-10-CM

## 2013-02-27 ENCOUNTER — Ambulatory Visit: Payer: Medicare Other | Admitting: *Deleted

## 2013-03-24 ENCOUNTER — Other Ambulatory Visit: Payer: Medicare Other

## 2013-06-02 ENCOUNTER — Encounter: Payer: Self-pay | Admitting: Interventional Cardiology

## 2013-06-02 DIAGNOSIS — I2510 Atherosclerotic heart disease of native coronary artery without angina pectoris: Secondary | ICD-10-CM | POA: Insufficient documentation

## 2013-06-02 DIAGNOSIS — I252 Old myocardial infarction: Secondary | ICD-10-CM | POA: Insufficient documentation

## 2013-06-02 DIAGNOSIS — N952 Postmenopausal atrophic vaginitis: Secondary | ICD-10-CM | POA: Insufficient documentation

## 2013-06-02 DIAGNOSIS — G2581 Restless legs syndrome: Secondary | ICD-10-CM | POA: Insufficient documentation

## 2013-06-02 DIAGNOSIS — J449 Chronic obstructive pulmonary disease, unspecified: Secondary | ICD-10-CM | POA: Insufficient documentation

## 2013-06-02 DIAGNOSIS — E785 Hyperlipidemia, unspecified: Secondary | ICD-10-CM | POA: Insufficient documentation

## 2013-06-02 DIAGNOSIS — D649 Anemia, unspecified: Secondary | ICD-10-CM | POA: Insufficient documentation

## 2013-06-02 DIAGNOSIS — M1990 Unspecified osteoarthritis, unspecified site: Secondary | ICD-10-CM | POA: Insufficient documentation

## 2013-07-18 ENCOUNTER — Telehealth: Payer: Self-pay | Admitting: Oncology

## 2013-07-18 NOTE — Telephone Encounter (Signed)
pt called to r/s 3/24 appt to 4/8. pt has new d/t

## 2013-07-22 ENCOUNTER — Ambulatory Visit: Payer: Medicare Other | Admitting: Oncology

## 2013-07-22 ENCOUNTER — Other Ambulatory Visit: Payer: Medicare Other

## 2013-08-05 ENCOUNTER — Telehealth: Payer: Self-pay | Admitting: Oncology

## 2013-08-05 NOTE — Telephone Encounter (Signed)
per FS to chge pt appt/avail date was 4/9 @ 1:45/left mess for pt of new time & date

## 2013-08-06 ENCOUNTER — Other Ambulatory Visit: Payer: Medicare Other

## 2013-08-06 ENCOUNTER — Ambulatory Visit: Payer: Medicare Other | Admitting: Oncology

## 2013-08-07 ENCOUNTER — Telehealth: Payer: Self-pay | Admitting: Oncology

## 2013-08-07 ENCOUNTER — Other Ambulatory Visit (HOSPITAL_BASED_OUTPATIENT_CLINIC_OR_DEPARTMENT_OTHER): Payer: Medicare Other

## 2013-08-07 ENCOUNTER — Encounter: Payer: Self-pay | Admitting: Physician Assistant

## 2013-08-07 ENCOUNTER — Ambulatory Visit (HOSPITAL_BASED_OUTPATIENT_CLINIC_OR_DEPARTMENT_OTHER): Payer: Medicare Other | Admitting: Physician Assistant

## 2013-08-07 VITALS — BP 160/93 | HR 77 | Temp 98.3°F | Resp 20 | Ht 62.0 in | Wt 246.3 lb

## 2013-08-07 DIAGNOSIS — D509 Iron deficiency anemia, unspecified: Secondary | ICD-10-CM

## 2013-08-07 DIAGNOSIS — I2510 Atherosclerotic heart disease of native coronary artery without angina pectoris: Secondary | ICD-10-CM

## 2013-08-07 DIAGNOSIS — E119 Type 2 diabetes mellitus without complications: Secondary | ICD-10-CM

## 2013-08-07 DIAGNOSIS — F17200 Nicotine dependence, unspecified, uncomplicated: Secondary | ICD-10-CM

## 2013-08-07 DIAGNOSIS — J449 Chronic obstructive pulmonary disease, unspecified: Secondary | ICD-10-CM

## 2013-08-07 LAB — IRON AND TIBC CHCC
%SAT: 13 % — ABNORMAL LOW (ref 21–57)
IRON: 56 ug/dL (ref 41–142)
TIBC: 443 ug/dL (ref 236–444)
UIBC: 386 ug/dL — ABNORMAL HIGH (ref 120–384)

## 2013-08-07 LAB — CBC WITH DIFFERENTIAL/PLATELET
BASO%: 0.2 % (ref 0.0–2.0)
Basophils Absolute: 0 10*3/uL (ref 0.0–0.1)
EOS%: 1.4 % (ref 0.0–7.0)
Eosinophils Absolute: 0.1 10*3/uL (ref 0.0–0.5)
HCT: 42.5 % (ref 34.8–46.6)
HGB: 13.6 g/dL (ref 11.6–15.9)
LYMPH%: 34.6 % (ref 14.0–49.7)
MCH: 26.1 pg (ref 25.1–34.0)
MCHC: 32 g/dL (ref 31.5–36.0)
MCV: 81.6 fL (ref 79.5–101.0)
MONO#: 0.6 10*3/uL (ref 0.1–0.9)
MONO%: 6.7 % (ref 0.0–14.0)
NEUT%: 57.1 % (ref 38.4–76.8)
NEUTROS ABS: 4.9 10*3/uL (ref 1.5–6.5)
Platelets: 195 10*3/uL (ref 145–400)
RBC: 5.21 10*6/uL (ref 3.70–5.45)
RDW: 14.5 % (ref 11.2–14.5)
WBC: 8.6 10*3/uL (ref 3.9–10.3)
lymph#: 3 10*3/uL (ref 0.9–3.3)

## 2013-08-07 LAB — FERRITIN CHCC: FERRITIN: 11 ng/mL (ref 9–269)

## 2013-08-07 NOTE — Telephone Encounter (Signed)
gv adn printed appt sched and avs for pt for Oct °

## 2013-08-07 NOTE — Progress Notes (Signed)
Hematology and Oncology Follow Up Visit  Courtney SizerBarbara D [name] [id number] 03/01/1946 68 y.o. 08/07/2013 2:08 PM [name], MD  Principle Diagnosis: 68 year old women with Iron deficiency anemia  Prior Therapy: She received IV Feraheme in December 2012.  Current therapy: Oral Fe supplement daily.   Interim History:  Courtney [name] returns today for routine followup. She is taking oral Fe and doing well in regards to her anemia. She does admit to not taking the iron supplements on a regular daily basis. She states that she is taking the oral iron about twice a week. She does report feeling a bit sluggish. She reports occasional feeling cold and that will fill hot and sweaty. Her appetite is good. She has some shortness of breath with exertion and with her activities of daily living. She was previously followed by Dr. William Hamburgerewy. She states that he had referred her to 2 other physicians within the practice. She is yet to establish with either one of them. Denies chest pain. No shortness of breath or dyspnea on exertion other than her baseline. She denies any pica symptoms.  She tolerating oral Fe without complications. No bleeding noted at this time.   Medications: I have reviewed the patient's current medications. Current outpatient prescriptions:albuterol (PROVENTIL HFA;VENTOLIN HFA) 108 (90 BASE) MCG/ACT inhaler, Inhale 2 puffs into the lungs every 6 (six) hours as needed. For shortness of breath, Disp: , Rfl: ;  amLODipine (NORVASC) 2.5 MG tablet, Take 2.5 mg by mouth daily.  , Disp: , Rfl: ;  atorvastatin (LIPITOR) 40 MG tablet, Take 40 mg by mouth daily. , Disp: , Rfl:  budesonide-formoterol (SYMBICORT) 160-4.5 MCG/ACT inhaler, Inhale 2 puffs into the lungs 2 (two) times daily., Disp: , Rfl: ;  Calcium Carbonate-Vitamin D (CALTRATE 600+D) 600-400 MG-UNIT per chew tablet, Chew 1 tablet by mouth 2 (two) times daily.  , Disp: , Rfl: ;  cholecalciferol (VITAMIN D) 1000 UNITS tablet, Take 2,000 Units by mouth daily.  ,  Disp: , Rfl: ;  clopidogrel (PLAVIX) 75 MG tablet, Take 75 mg by mouth daily.  , Disp: , Rfl:  docusate sodium (COLACE) 100 MG capsule, Take 100 mg by mouth 2 (two) times daily. , Disp: , Rfl: ;  furosemide (LASIX) 40 MG tablet, Take 20 mg by mouth daily., Disp: , Rfl: ;  glipiZIDE (GLUCOTROL XL) 10 MG 24 hr tablet, Take 10 mg by mouth daily., Disp: , Rfl: ;  losartan (COZAAR) 100 MG tablet, Take 100 mg by mouth daily.  , Disp: , Rfl:  metFORMIN (GLUCOPHAGE) 500 MG tablet, Take 1,000 mg by mouth 2 (two) times daily with a meal. Patient also takes 500 mg at lunch, Disp: , Rfl: ;  metoprolol succinate (TOPROL-XL) 25 MG 24 hr tablet, Take 25 mg by mouth daily. , Disp: , Rfl: ;  Linaclotide (LINZESS) 145 MCG CAPS capsule, Take 145 mcg by mouth daily., Disp: , Rfl:  nitroGLYCERIN (NITROSTAT) 0.4 MG SL tablet, Place 0.4 mg under the tongue every 5 (five) minutes as needed.  , Disp: , Rfl:   Allergies:  Allergies  Allergen Reactions  . Penicillins     Past Medical History, Surgical history, Social history, and Family History were reviewed and updated.  Review of Systems: Constitutional:  Negative for fever, chills, night sweats, anorexia, weight loss, pain. Cardiovascular: no chest pain or dyspnea on exertion Respiratory: no cough, shortness of breath, or wheezing Neurological: no TIA or stroke symptoms Dermatological: negative ENT: negative Skin: Negative. Gastrointestinal: no abdominal pain, change  in bowel habits, or black or bloody stools, positive for occasional constipation Genito-Urinary: no dysuria, trouble voiding, or hematuria Hematological and Lymphatic: negative Breast: negative for breast lumps Musculoskeletal: negative Remaining ROS negative. Physical Exam: Blood pressure 160/93, pulse 77, temperature 98.3 F (36.8 C), temperature source Oral, resp. rate 20, height 5\' 2"  (1.575 m), weight 246 lb 4.8 oz (111.721 kg), SpO2 100.00%. ECOG: 1 General appearance: alert, cooperative  and no distress Head: Normocephalic, without obvious abnormality, atraumatic Neck: no adenopathy, no carotid bruit, no JVD, supple, symmetrical, trachea midline and thyroid not enlarged, symmetric, no tenderness/mass/nodules Lymph nodes: Cervical, supraclavicular, and axillary nodes normal. Heart:regular rate and rhythm, S1, S2 normal, no murmur, click, rub or gallop Lung:no tachypnea, retractions or cyanosis, mild expiratory wheezing heard diffusely throughout both lungs, no rales or rhonchi present Abdomen: soft, non-tender, without masses or organomegaly EXT:no erythema, induration, or nodules   Lab Results: Lab Results  Component Value Date   WBC 8.6 08/07/2013   HGB 13.6 08/07/2013   HCT 42.5 08/07/2013   MCV 81.6 08/07/2013   PLT 195 08/07/2013     Chemistry      Component Value Date/Time   NA 141 01/22/2013 0845   NA 142 08/22/2011 1426   K 4.1 01/22/2013 0845   K 4.2 08/22/2011 1426   CL 103 01/26/2012 1242   CL 103 08/22/2011 1426   CO2 26 01/22/2013 0845   CO2 27 08/22/2011 1426   BUN 7.6 01/22/2013 0845   BUN 11 08/22/2011 1426   CREATININE 0.8 01/22/2013 0845   CREATININE 0.70 08/22/2011 1426      Component Value Date/Time   CALCIUM 10.0 01/22/2013 0845   CALCIUM 9.9 08/22/2011 1426   ALKPHOS 161* 01/22/2013 0845   ALKPHOS 156* 08/22/2011 1426   AST 17 01/22/2013 0845   AST 19 08/22/2011 1426   ALT 22 01/22/2013 0845   ALT 17 08/22/2011 1426   BILITOT 0.28 01/22/2013 0845   BILITOT 0.4 08/22/2011 1426     Iron studies from today are pending  Impression and Plan: This is a 68 year old female with the following issues: 1. Iron deficiency anemia: Her hemoglobin today is 13.6. Her ferritin level is pending today. She is currently on any oral ferrous sulfate and doing well. The plan is to check her counts in 6 months. 2. Coronary artery disease: She has now stopped her aspirin, but remains on Plavix. She will continue to follow up with cardiology. 3. COPD: On albuterol and Symbicort per  primary care provider. She will continue to follow with her PCP. 4. Tobacco dependence: The patient continues to smoke one half pack per day of cigarettes. She is not interested in smoking cessation at this time. 5. Diabetes mellitus-patient is encouraged to establish care with one of the other physicians in her previous physicians practice. 6. Follow up: In 6 months with a CBC and iron studies.  Patient reviewed with Dr. Roswell Nickel, PA-C 4/9/20152:08 PM

## 2013-08-11 NOTE — Patient Instructions (Signed)
Continue your oral iron Establish care with one of the other physicians in your previous doctors practice to follow your diabetes and other health issues Followup with Dr. Clelia CroftShadad in 6 months to repeat to evaluate your iron studies

## 2013-11-11 ENCOUNTER — Encounter: Payer: Self-pay | Admitting: Interventional Cardiology

## 2013-12-11 ENCOUNTER — Ambulatory Visit: Payer: Medicare Other | Admitting: Interventional Cardiology

## 2014-01-01 ENCOUNTER — Ambulatory Visit: Payer: Medicare Other | Admitting: Interventional Cardiology

## 2014-02-04 ENCOUNTER — Telehealth: Payer: Self-pay | Admitting: Oncology

## 2014-02-04 ENCOUNTER — Encounter: Payer: Self-pay | Admitting: Oncology

## 2014-02-04 ENCOUNTER — Other Ambulatory Visit (HOSPITAL_BASED_OUTPATIENT_CLINIC_OR_DEPARTMENT_OTHER): Payer: Medicare Other

## 2014-02-04 ENCOUNTER — Ambulatory Visit (HOSPITAL_BASED_OUTPATIENT_CLINIC_OR_DEPARTMENT_OTHER): Payer: Medicare Other | Admitting: Oncology

## 2014-02-04 VITALS — BP 197/84 | HR 71 | Temp 97.5°F | Resp 19 | Ht 62.0 in | Wt 243.9 lb

## 2014-02-04 DIAGNOSIS — D509 Iron deficiency anemia, unspecified: Secondary | ICD-10-CM

## 2014-02-04 DIAGNOSIS — E119 Type 2 diabetes mellitus without complications: Secondary | ICD-10-CM

## 2014-02-04 DIAGNOSIS — Z720 Tobacco use: Secondary | ICD-10-CM

## 2014-02-04 DIAGNOSIS — F17210 Nicotine dependence, cigarettes, uncomplicated: Secondary | ICD-10-CM

## 2014-02-04 DIAGNOSIS — J449 Chronic obstructive pulmonary disease, unspecified: Secondary | ICD-10-CM

## 2014-02-04 DIAGNOSIS — I2510 Atherosclerotic heart disease of native coronary artery without angina pectoris: Secondary | ICD-10-CM

## 2014-02-04 LAB — FERRITIN CHCC: Ferritin: 15 ng/ml (ref 9–269)

## 2014-02-04 LAB — IRON AND TIBC CHCC
%SAT: 17 % — ABNORMAL LOW (ref 21–57)
Iron: 70 ug/dL (ref 41–142)
TIBC: 410 ug/dL (ref 236–444)
UIBC: 340 ug/dL (ref 120–384)

## 2014-02-04 LAB — CBC WITH DIFFERENTIAL/PLATELET
BASO%: 0.3 % (ref 0.0–2.0)
Basophils Absolute: 0 10*3/uL (ref 0.0–0.1)
EOS%: 1.5 % (ref 0.0–7.0)
Eosinophils Absolute: 0.2 10*3/uL (ref 0.0–0.5)
HEMATOCRIT: 42.9 % (ref 34.8–46.6)
HGB: 13.7 g/dL (ref 11.6–15.9)
LYMPH%: 40.3 % (ref 14.0–49.7)
MCH: 26.2 pg (ref 25.1–34.0)
MCHC: 31.8 g/dL (ref 31.5–36.0)
MCV: 82.4 fL (ref 79.5–101.0)
MONO#: 0.6 10*3/uL (ref 0.1–0.9)
MONO%: 6.2 % (ref 0.0–14.0)
NEUT#: 5.4 10*3/uL (ref 1.5–6.5)
NEUT%: 51.7 % (ref 38.4–76.8)
PLATELETS: 223 10*3/uL (ref 145–400)
RBC: 5.21 10*6/uL (ref 3.70–5.45)
RDW: 15.2 % — ABNORMAL HIGH (ref 11.2–14.5)
WBC: 10.5 10*3/uL — ABNORMAL HIGH (ref 3.9–10.3)
lymph#: 4.2 10*3/uL — ABNORMAL HIGH (ref 0.9–3.3)

## 2014-02-04 NOTE — Progress Notes (Signed)
Hematology and Oncology Follow Up Visit  Courtney SizerBarbara D [name] [id number] 05/18/1945 68 y.o. 02/04/2014 1:34 PM [name], MD  Principle Diagnosis: 68 year old women with Iron deficiency anemia diagnosed in 2012.   Prior Therapy: She received IV Feraheme in December 2012.  Current therapy: Oral Fe supplement daily.   Interim History:  Courtney [name] returns today for routine followup. Since the last visit, she been doing relatively well. Her energy have been reasonable without any decline. She reports occasional feeling cold. She has some shortness of breath with exertion and with her activities of daily living. Denies chest pain. No shortness of breath or dyspnea on exertion other than her baseline. She denies any pica symptoms.  She tolerating oral Fe without complications. No bleeding noted at this time. She does not report any headaches or blurry vision. Does not report any syncope. Does not report any weight loss or appetite changes. He denies any cough or hemoptysis. Does not report any skeletal complaints. Her sooner review of systems is unremarkable.  Medications: I have reviewed the patient's current medications.   Current Outpatient Prescriptions  Medication Sig Dispense Refill  . diclofenac sodium (VOLTAREN) 1 % GEL Place 4 g onto the skin.      . metoprolol tartrate (LOPRESSOR) 25 MG tablet Take 25 mg by mouth.      Marland Kitchen. albuterol (PROVENTIL HFA;VENTOLIN HFA) 108 (90 BASE) MCG/ACT inhaler Inhale 2 puffs into the lungs every 6 (six) hours as needed. For shortness of breath      . amLODipine (NORVASC) 2.5 MG tablet Take 2.5 mg by mouth daily.        Marland Kitchen. atorvastatin (LIPITOR) 40 MG tablet Take 40 mg by mouth daily.       . budesonide-formoterol (SYMBICORT) 160-4.5 MCG/ACT inhaler Inhale 2 puffs into the lungs 2 (two) times daily.      . Calcium Carbonate-Vitamin D (CALTRATE 600+D) 600-400 MG-UNIT per chew tablet Chew 1 tablet by mouth 2 (two) times daily.        . cholecalciferol (VITAMIN D)  1000 UNITS tablet Take 2,000 Units by mouth daily.        . clopidogrel (PLAVIX) 75 MG tablet Take 75 mg by mouth daily.        Marland Kitchen. docusate sodium (COLACE) 100 MG capsule Take 100 mg by mouth 2 (two) times daily.       . furosemide (LASIX) 40 MG tablet Take 20 mg by mouth daily.      Marland Kitchen. gabapentin (NEURONTIN) 300 MG capsule Take 300 mg by mouth.      Marland Kitchen. glipiZIDE (GLUCOTROL XL) 10 MG 24 hr tablet Take 10 mg by mouth daily.      . Linaclotide (LINZESS) 145 MCG CAPS capsule Take 145 mcg by mouth daily.      Marland Kitchen. losartan (COZAAR) 100 MG tablet Take 100 mg by mouth daily.        . metFORMIN (GLUCOPHAGE) 500 MG tablet Take 1,000 mg by mouth 2 (two) times daily with a meal. Patient also takes 500 mg at lunch      . metoprolol succinate (TOPROL-XL) 25 MG 24 hr tablet Take 25 mg by mouth daily.       . nitroGLYCERIN (NITROSTAT) 0.4 MG SL tablet Place 0.4 mg under the tongue every 5 (five) minutes as needed.        . Polyethylene Glycol 3350 POWD by Does not apply route.       No current facility-administered medications for this visit.  Allergies:  Allergies  Allergen Reactions  . Eggs Or Egg-Derived Products   . Penicillins     Past Medical History, Surgical history, Social history, and Family History were reviewed and updated.   Physical Exam: Blood pressure 197/84, pulse 71, temperature 97.5 F (36.4 C), temperature source Oral, resp. rate 19, height 5\' 2"  (1.575 m), weight 243 lb 14.4 oz (110.632 kg), SpO2 100.00%. ECOG: 1 General appearance: alert and cooperative Head: Normocephalic, without obvious abnormality Neck: no adenopathy Lymph nodes: Cervical, supraclavicular, and axillary nodes normal. Heart:regular rate and rhythm, S1, S2 normal, no murmur, click, rub or gallop Lung: Clear to auscultation without rhonchi or wheezes. Abdomen: soft, non-tender, without masses or organomegaly EXT:no erythema, induration, or nodules   Lab Results: Lab Results  Component Value Date   WBC  10.5* 02/04/2014   HGB 13.7 02/04/2014   HCT 42.9 02/04/2014   MCV 82.4 02/04/2014   PLT 223 02/04/2014     Chemistry      Component Value Date/Time   NA 141 01/22/2013 0845   NA 142 08/22/2011 1426   K 4.1 01/22/2013 0845   K 4.2 08/22/2011 1426   CL 103 01/26/2012 1242   CL 103 08/22/2011 1426   CO2 26 01/22/2013 0845   CO2 27 08/22/2011 1426   BUN 7.6 01/22/2013 0845   BUN 11 08/22/2011 1426   CREATININE 0.8 01/22/2013 0845   CREATININE 0.70 08/22/2011 1426      Component Value Date/Time   CALCIUM 10.0 01/22/2013 0845   CALCIUM 9.9 08/22/2011 1426   ALKPHOS 161* 01/22/2013 0845   ALKPHOS 156* 08/22/2011 1426   AST 17 01/22/2013 0845   AST 19 08/22/2011 1426   ALT 22 01/22/2013 0845   ALT 17 08/22/2011 1426   BILITOT 0.28 01/22/2013 0845   BILITOT 0.4 08/22/2011 1426       Impression and Plan: This is a 68 year old female with the following issues: 1. Iron deficiency anemia: Her hemoglobin today is 13.7. Her ferritin level is pending today. She is currently on any oral ferrous sulfate and doing well. The plan is to check her counts in 6 months. 2. Coronary artery disease: She has now stopped her aspirin, but remains on Plavix.  3. COPD: On albuterol and Symbicort per primary care provider.  4. Tobacco dependence: The patient continues to smoke one half pack per day of cigarettes. She is not interested in smoking cessation at this time. 5. Diabetes mellitus: Followed by her primary care physician. 6. Follow up: In 6 months with a CBC and iron studies.     Courtney Kilty, MD 10/7/20151:34 PM

## 2014-02-04 NOTE — Telephone Encounter (Signed)
LVM for pt regarding to apt 04/16. Mailed apt schedule avs and letter.

## 2014-04-14 ENCOUNTER — Other Ambulatory Visit: Payer: Self-pay | Admitting: Nurse Practitioner

## 2014-04-29 ENCOUNTER — Encounter: Payer: Self-pay | Admitting: Interventional Cardiology

## 2014-06-02 ENCOUNTER — Other Ambulatory Visit: Payer: Self-pay | Admitting: Physician Assistant

## 2014-06-10 ENCOUNTER — Other Ambulatory Visit: Payer: Self-pay | Admitting: Family Medicine

## 2014-06-10 DIAGNOSIS — Z1231 Encounter for screening mammogram for malignant neoplasm of breast: Secondary | ICD-10-CM

## 2014-06-18 ENCOUNTER — Ambulatory Visit
Admission: RE | Admit: 2014-06-18 | Discharge: 2014-06-18 | Disposition: A | Discharge status: Home / Self Care | Payer: Medicare Other | Source: Ambulatory Visit | Attending: Family Medicine | Admitting: Family Medicine

## 2014-06-18 ENCOUNTER — Encounter (INDEPENDENT_AMBULATORY_CARE_PROVIDER_SITE_OTHER): Payer: Self-pay

## 2014-06-18 DIAGNOSIS — Z1231 Encounter for screening mammogram for malignant neoplasm of breast: Secondary | ICD-10-CM

## 2014-08-06 ENCOUNTER — Encounter: Payer: Self-pay | Admitting: Physician Assistant

## 2014-08-06 ENCOUNTER — Telehealth: Payer: Self-pay | Admitting: Oncology

## 2014-08-06 ENCOUNTER — Other Ambulatory Visit (HOSPITAL_BASED_OUTPATIENT_CLINIC_OR_DEPARTMENT_OTHER): Payer: Medicare Other

## 2014-08-06 ENCOUNTER — Ambulatory Visit (HOSPITAL_BASED_OUTPATIENT_CLINIC_OR_DEPARTMENT_OTHER): Payer: Medicare Other | Admitting: Physician Assistant

## 2014-08-06 VITALS — BP 146/79 | HR 91 | Temp 98.1°F | Resp 18 | Ht 62.0 in | Wt 239.4 lb

## 2014-08-06 DIAGNOSIS — J449 Chronic obstructive pulmonary disease, unspecified: Secondary | ICD-10-CM | POA: Diagnosis not present

## 2014-08-06 DIAGNOSIS — E119 Type 2 diabetes mellitus without complications: Secondary | ICD-10-CM

## 2014-08-06 DIAGNOSIS — D509 Iron deficiency anemia, unspecified: Secondary | ICD-10-CM

## 2014-08-06 LAB — COMPREHENSIVE METABOLIC PANEL (CC13)
ALK PHOS: 140 U/L (ref 40–150)
ALT: 15 U/L (ref 0–55)
ANION GAP: 11 meq/L (ref 3–11)
AST: 12 U/L (ref 5–34)
Albumin: 3.6 g/dL (ref 3.5–5.0)
BILIRUBIN TOTAL: 0.29 mg/dL (ref 0.20–1.20)
BUN: 12.1 mg/dL (ref 7.0–26.0)
CALCIUM: 10.6 mg/dL — AB (ref 8.4–10.4)
CO2: 29 meq/L (ref 22–29)
CREATININE: 1.1 mg/dL (ref 0.6–1.1)
Chloride: 102 mEq/L (ref 98–109)
EGFR: 62 mL/min/{1.73_m2} — AB (ref >90)
GLUCOSE: 372 mg/dL — AB (ref 70–140)
Potassium: 4.8 mEq/L (ref 3.5–5.1)
SODIUM: 142 meq/L (ref 136–145)
TOTAL PROTEIN: 6.9 g/dL (ref 6.4–8.3)

## 2014-08-06 LAB — CBC WITH DIFFERENTIAL/PLATELET
BASO%: 0.3 % (ref 0.0–2.0)
BASOS ABS: 0 10*3/uL (ref 0.0–0.1)
EOS ABS: 0.1 10*3/uL (ref 0.0–0.5)
EOS%: 0.9 % (ref 0.0–7.0)
HEMATOCRIT: 45.1 % (ref 34.8–46.6)
HEMOGLOBIN: 14.4 g/dL (ref 11.6–15.9)
LYMPH#: 3.6 10*3/uL — AB (ref 0.9–3.3)
LYMPH%: 37.4 % (ref 14.0–49.7)
MCH: 26.6 pg (ref 25.1–34.0)
MCHC: 31.9 g/dL (ref 31.5–36.0)
MCV: 83.4 fL (ref 79.5–101.0)
MONO#: 0.5 10*3/uL (ref 0.1–0.9)
MONO%: 5.2 % (ref 0.0–14.0)
NEUT#: 5.4 10*3/uL (ref 1.5–6.5)
NEUT%: 56.2 % (ref 38.4–76.8)
PLATELETS: 213 10*3/uL (ref 145–400)
RBC: 5.41 10*6/uL (ref 3.70–5.45)
RDW: 14.7 % — ABNORMAL HIGH (ref 11.2–14.5)
WBC: 9.7 10*3/uL (ref 3.9–10.3)

## 2014-08-06 LAB — IRON AND TIBC CHCC
%SAT: 13 % — ABNORMAL LOW (ref 21–57)
Iron: 57 ug/dL (ref 41–142)
TIBC: 435 ug/dL (ref 236–444)
UIBC: 378 ug/dL (ref 120–384)

## 2014-08-06 LAB — FERRITIN CHCC: FERRITIN: 10 ng/mL (ref 9–269)

## 2014-08-06 NOTE — Progress Notes (Signed)
Hematology and Oncology Follow Up Visit  Courtney [name] [id number] 1945/07/27 69 y.o. 08/06/2014 4:26 PM [name], MD  Principle Diagnosis: 69 year old women with Iron deficiency anemia diagnosed in 2012.   Prior Therapy: She received IV Feraheme in December 2012.  Current therapy: Oral Fe supplement daily.   Interim History:  Courtney [name] returns today for routine followup. Since the last visit, she been doing relatively well. Her energy have been reasonable without any decline. She reports that she is only taking her iron supplement about once a week. She is also taking vitamin E.  Denies chest pain. No shortness of breath or dyspnea on exertion other than her baseline. She denies any pica symptoms.  No bleeding noted at this time. She does not report any headaches or blurry vision. Does not report any syncope. Does not report any weight loss or appetite changes. He denies any cough or hemoptysis. Does not report any skeletal complaints. Remainder of her review of systems is unremarkable.  Medications: I have reviewed the patient's current medications.   Current Outpatient Prescriptions  Medication Sig Dispense Refill  . albuterol (PROVENTIL HFA;VENTOLIN HFA) 108 (90 BASE) MCG/ACT inhaler Inhale 2 puffs into the lungs every 6 (six) hours as needed. For shortness of breath    . amLODipine (NORVASC) 2.5 MG tablet Take 2.5 mg by mouth daily.      Marland Kitchen atorvastatin (LIPITOR) 40 MG tablet Take 40 mg by mouth daily.     . budesonide-formoterol (SYMBICORT) 160-4.5 MCG/ACT inhaler Inhale 2 puffs into the lungs 2 (two) times daily.    . Calcium Carbonate-Vitamin D (CALTRATE 600+D) 600-400 MG-UNIT per chew tablet Chew 1 tablet by mouth 2 (two) times daily.      . cholecalciferol (VITAMIN D) 1000 UNITS tablet Take 2,000 Units by mouth daily.      . clopidogrel (PLAVIX) 75 MG tablet Take 75 mg by mouth daily.      . Linaclotide (LINZESS) 145 MCG CAPS capsule Take 145 mcg by mouth daily.    Marland Kitchen losartan  (COZAAR) 100 MG tablet Take 100 mg by mouth daily.      . metFORMIN (GLUCOPHAGE) 500 MG tablet Take 1,000 mg by mouth 2 (two) times daily with a meal. Patient also takes 500 mg at lunch    . diclofenac sodium (VOLTAREN) 1 % GEL Place 4 g onto the skin.    Marland Kitchen docusate sodium (COLACE) 100 MG capsule Take 100 mg by mouth 2 (two) times daily.     . furosemide (LASIX) 40 MG tablet Take 20 mg by mouth daily.    Marland Kitchen gabapentin (NEURONTIN) 300 MG capsule Take 300 mg by mouth.    Marland Kitchen glipiZIDE (GLUCOTROL XL) 10 MG 24 hr tablet Take 10 mg by mouth daily.    . Insulin Degludec 100 UNIT/ML SOPN Inject 20 Units into the skin.    . metoprolol succinate (TOPROL-XL) 25 MG 24 hr tablet Take 25 mg by mouth daily.     . metoprolol tartrate (LOPRESSOR) 25 MG tablet Take 25 mg by mouth.    . nitroGLYCERIN (NITROSTAT) 0.4 MG SL tablet Place 0.4 mg under the tongue every 5 (five) minutes as needed.      . Polyethylene Glycol 3350 POWD by Does not apply route.     No current facility-administered medications for this visit.     Allergies:  Allergies  Allergen Reactions  . Eggs Or Egg-Derived Products   . Penicillins     Past Medical History, Surgical  history, Social history, and Family History were reviewed and updated.   Physical Exam: Blood pressure 146/79, pulse 91, temperature 98.1 F (36.7 C), temperature source Oral, resp. rate 18, height 5\' 2"  (1.575 m), weight 239 lb 6.4 oz (108.591 kg), SpO2 100 %. ECOG: 1 General appearance: alert and cooperative Head: Normocephalic, without obvious abnormality Neck: no adenopathy Lymph nodes: Cervical, supraclavicular, and axillary nodes normal. Heart:regular rate and rhythm, S1, S2 normal, no murmur, click, rub or gallop Lung: Clear to auscultation without rhonchi or wheezes. Abdomen: soft, non-tender, without masses or organomegaly EXT:no erythema, induration, or nodules   Lab Results: Lab Results  Component Value Date   WBC 9.7 08/06/2014   HGB 14.4  08/06/2014   HCT 45.1 08/06/2014   MCV 83.4 08/06/2014   PLT 213 08/06/2014     Chemistry      Component Value Date/Time   NA 142 08/06/2014 0854   NA 142 08/22/2011 1426   K 4.8 08/06/2014 0854   K 4.2 08/22/2011 1426   CL 103 01/26/2012 1242   CL 103 08/22/2011 1426   CO2 29 08/06/2014 0854   CO2 27 08/22/2011 1426   BUN 12.1 08/06/2014 0854   BUN 11 08/22/2011 1426   CREATININE 1.1 08/06/2014 0854   CREATININE 0.70 08/22/2011 1426      Component Value Date/Time   CALCIUM 10.6* 08/06/2014 0854   CALCIUM 9.9 08/22/2011 1426   ALKPHOS 140 08/06/2014 0854   ALKPHOS 156* 08/22/2011 1426   AST 12 08/06/2014 0854   AST 19 08/22/2011 1426   ALT 15 08/06/2014 0854   ALT 17 08/22/2011 1426   BILITOT 0.29 08/06/2014 0854   BILITOT 0.4 08/22/2011 1426       Impression and Plan: This is a 69 year old female with the following issues: 1. Iron deficiency anemia: Her hemoglobin today is 14.4. Her ferritin level is pending today. She is currently on oral ferrous sulfate infrequently and doing well. She was encouraged to take her iron supplement more regularly.The plan is to check her counts in 6 months. 2. Coronary artery disease: She has now stopped her aspirin, but remains on Plavix.  3. COPD: On albuterol and Symbicort per primary care provider.  4. Tobacco dependence: The patient continues to smoke one half pack per day of cigarettes. She is not interested in smoking cessation at this time. 5. Diabetes mellitus: Followed by her primary care physician. 6. Follow up: In 6 months with a CBC and iron studies.     Conni SlipperJOHNSON, Teresha Hanks E, PA-C  4/7/20164:26 PM

## 2014-08-06 NOTE — Telephone Encounter (Signed)
gave and printed appt sched and avs fo rpt for OCT. °

## 2014-08-10 NOTE — Patient Instructions (Signed)
Take your iron supplement more  Regularly Follow up in 6 months

## 2015-02-05 ENCOUNTER — Other Ambulatory Visit (HOSPITAL_BASED_OUTPATIENT_CLINIC_OR_DEPARTMENT_OTHER): Payer: Medicare Other

## 2015-02-05 ENCOUNTER — Telehealth: Payer: Self-pay | Admitting: Oncology

## 2015-02-05 ENCOUNTER — Ambulatory Visit (HOSPITAL_BASED_OUTPATIENT_CLINIC_OR_DEPARTMENT_OTHER): Payer: Medicare Other | Admitting: Oncology

## 2015-02-05 VITALS — BP 156/72 | Temp 97.9°F | Resp 20 | Ht 62.0 in | Wt 242.8 lb

## 2015-02-05 DIAGNOSIS — E119 Type 2 diabetes mellitus without complications: Secondary | ICD-10-CM | POA: Diagnosis not present

## 2015-02-05 DIAGNOSIS — D508 Other iron deficiency anemias: Secondary | ICD-10-CM

## 2015-02-05 DIAGNOSIS — J449 Chronic obstructive pulmonary disease, unspecified: Secondary | ICD-10-CM | POA: Diagnosis not present

## 2015-02-05 DIAGNOSIS — D509 Iron deficiency anemia, unspecified: Secondary | ICD-10-CM | POA: Diagnosis present

## 2015-02-05 LAB — CBC WITH DIFFERENTIAL/PLATELET
BASO%: 0.6 % (ref 0.0–2.0)
BASOS ABS: 0.1 10*3/uL (ref 0.0–0.1)
EOS ABS: 0.1 10*3/uL (ref 0.0–0.5)
EOS%: 1.2 % (ref 0.0–7.0)
HCT: 42.5 % (ref 34.8–46.6)
HGB: 13.4 g/dL (ref 11.6–15.9)
LYMPH%: 32.7 % (ref 14.0–49.7)
MCH: 24.8 pg — AB (ref 25.1–34.0)
MCHC: 31.6 g/dL (ref 31.5–36.0)
MCV: 78.6 fL — AB (ref 79.5–101.0)
MONO#: 0.6 10*3/uL (ref 0.1–0.9)
MONO%: 7.1 % (ref 0.0–14.0)
NEUT#: 5.1 10*3/uL (ref 1.5–6.5)
NEUT%: 58.4 % (ref 38.4–76.8)
Platelets: 220 10*3/uL (ref 145–400)
RBC: 5.4 10*6/uL (ref 3.70–5.45)
RDW: 15.1 % — ABNORMAL HIGH (ref 11.2–14.5)
WBC: 8.7 10*3/uL (ref 3.9–10.3)
lymph#: 2.8 10*3/uL (ref 0.9–3.3)

## 2015-02-05 LAB — IRON AND TIBC CHCC
%SAT: 10 % — AB (ref 21–57)
Iron: 43 ug/dL (ref 41–142)
TIBC: 450 ug/dL — ABNORMAL HIGH (ref 236–444)
UIBC: 407 ug/dL — ABNORMAL HIGH (ref 120–384)

## 2015-02-05 LAB — FERRITIN CHCC: FERRITIN: 10 ng/mL (ref 9–269)

## 2015-02-05 NOTE — Telephone Encounter (Signed)
Gave adn printed appt sched for OCT and Feb 2017

## 2015-02-05 NOTE — Progress Notes (Signed)
Hematology and Oncology Follow Up Visit  Courtney [name] [id number] 1946/02/29 69 y.o. 02/05/2015 9:08 AM [name], MD  Principle Diagnosis: 69 year old women with Iron deficiency anemia diagnosed in 2012.   Prior Therapy: She received IV Feraheme in December 2012.  Current therapy: Oral Fe supplement daily. She has stopped taking at this time.  Interim History:  Courtney [name] returns today for a follow up by herself. Since the last visit, she reports slight decline in her energy and performance status. She has stopped taking oral iron months ago. She have reported increase in her ice cravings and cold sensitivity. She has not reported any hematochezia, melanoma or hemoptysis.  She denies any headaches or blurry vision. She denies chest pain, shortness of breath or dyspnea on exertion. No bleeding noted at this time. Does not report any syncope. Does not report any weight loss or appetite changes. He denies any cough or hemoptysis. Does not report any skeletal complaints. Remainder of her review of systems is unremarkable.  Medications: I have reviewed the patient's current medications.   Current Outpatient Prescriptions  Medication Sig Dispense Refill  . albuterol (PROVENTIL HFA;VENTOLIN HFA) 108 (90 BASE) MCG/ACT inhaler Inhale 2 puffs into the lungs every 6 (six) hours as needed. For shortness of breath    . amLODipine (NORVASC) 2.5 MG tablet Take 2.5 mg by mouth daily.      Marland Kitchen atorvastatin (LIPITOR) 40 MG tablet Take 40 mg by mouth daily.     . budesonide-formoterol (SYMBICORT) 160-4.5 MCG/ACT inhaler Inhale 2 puffs into the lungs 2 (two) times daily.    . Calcium Carbonate-Vitamin D (CALTRATE 600+D) 600-400 MG-UNIT per chew tablet Chew 1 tablet by mouth 2 (two) times daily.      . cholecalciferol (VITAMIN D) 1000 UNITS tablet Take 2,000 Units by mouth daily.      . clopidogrel (PLAVIX) 75 MG tablet Take 75 mg by mouth daily.      Marland Kitchen docusate sodium (COLACE) 100 MG capsule Take 100 mg by  mouth 2 (two) times daily.     . furosemide (LASIX) 40 MG tablet Take 20 mg by mouth daily.    Marland Kitchen gabapentin (NEURONTIN) 300 MG capsule Take 300 mg by mouth.    Marland Kitchen glipiZIDE (GLUCOTROL XL) 10 MG 24 hr tablet Take 10 mg by mouth daily.    . Insulin Degludec 100 UNIT/ML SOPN Inject 20 Units into the skin.    . Linaclotide (LINZESS) 145 MCG CAPS capsule Take 145 mcg by mouth daily.    Marland Kitchen losartan (COZAAR) 100 MG tablet Take 100 mg by mouth daily.      . metFORMIN (GLUCOPHAGE) 500 MG tablet Take 1,000 mg by mouth 2 (two) times daily with a meal. Patient also takes 500 mg at lunch    . metoprolol succinate (TOPROL-XL) 25 MG 24 hr tablet Take 25 mg by mouth daily.     . metoprolol tartrate (LOPRESSOR) 25 MG tablet Take 25 mg by mouth.    . nitroGLYCERIN (NITROSTAT) 0.4 MG SL tablet Place 0.4 mg under the tongue every 5 (five) minutes as needed.      . Polyethylene Glycol 3350 POWD by Does not apply route.     No current facility-administered medications for this visit.     Allergies:  Allergies  Allergen Reactions  . Eggs Or Egg-Derived Products   . Penicillins     Past Medical History, Surgical history, Social history, and Family History were reviewed and updated.   Physical Exam:  Blood pressure 156/72, temperature 97.9 F (36.6 C), temperature source Oral, resp. rate 20, height  (1.575 m), weight 242 lb 12.8 oz (110.133 kg), SpO2 100 %. ECOG: 1 General appearance: alert and cooperative appeared in no distress. Head: Normocephalic, without obvious abnormality no oral ulcers or lesions. Neck: no adenopathy Lymph nodes: Cervical, supraclavicular, and axillary nodes normal. Heart:regular rate and rhythm, S1, S2 normal, no murmur, click, rub or gallop Lung: Clear to auscultation without rhonchi or wheezes. Abdomen: soft, non-tender, without masses or organomegaly EXT:no erythema, induration, or nodules   Lab Results: Lab Results  Component Value Date   WBC 8.7 02/05/2015   HGB  13.4 02/05/2015   HCT 42.5 02/05/2015   MCV 78.6* 02/05/2015   PLT 220 02/05/2015     Chemistry      Component Value Date/Time   NA 142 08/06/2014 0854   NA 142 08/22/2011 1426   K 4.8 08/06/2014 0854   K 4.2 08/22/2011 1426   CL 103 01/26/2012 1242   CL 103 08/22/2011 1426   CO2 29 08/06/2014 0854   CO2 27 08/22/2011 1426   BUN 12.1 08/06/2014 0854   BUN 11 08/22/2011 1426   CREATININE 1.1 08/06/2014 0854   CREATININE 0.70 08/22/2011 1426      Component Value Date/Time   CALCIUM 10.6* 08/06/2014 0854   CALCIUM 9.9 08/22/2011 1426   ALKPHOS 140 08/06/2014 0854   ALKPHOS 156* 08/22/2011 1426   AST 12 08/06/2014 0854   AST 19 08/22/2011 1426   ALT 15 08/06/2014 0854   ALT 17 08/22/2011 1426   BILITOT 0.29 08/06/2014 0854   BILITOT 0.4 08/22/2011 1426       Impression and Plan: This is a 69 year old female with the following issues: 1. Iron deficiency anemia: Her hemoglobin today is is normal but have been declining. Her iron stores for the last year also showed continuous decline. She is becoming symptomatic at this time suggestive of worsening iron deficiency. Risks and benefits of IV iron at this time were reviewed and she is agreeable to receive it. Feraheme complications include infusion related complications, arthralgias, myalgias and rarely anaphylaxis. I plan to give her a total of 1 g in the next 2 weeks. 2. Coronary artery disease: She has now stopped her aspirin, but remains on Plavix.  3. COPD: On albuterol and Symbicort. Her respiratory status is stable. 4. Diabetes mellitus: Followed by her primary care physician. 5. Follow up: In 4 months with a CBC and iron studies.     SHADAD,FIRAS, MD 10/7/20169:08 AM

## 2015-02-12 ENCOUNTER — Ambulatory Visit (HOSPITAL_BASED_OUTPATIENT_CLINIC_OR_DEPARTMENT_OTHER): Payer: Medicare Other

## 2015-02-12 VITALS — BP 144/75 | HR 78 | Temp 97.8°F | Resp 16

## 2015-02-12 DIAGNOSIS — D509 Iron deficiency anemia, unspecified: Secondary | ICD-10-CM | POA: Diagnosis present

## 2015-02-12 DIAGNOSIS — D508 Other iron deficiency anemias: Secondary | ICD-10-CM

## 2015-02-12 MED ORDER — SODIUM CHLORIDE 0.9 % IV SOLN
Freq: Once | INTRAVENOUS | Status: AC
  Administered 2015-02-12: 09:00:00 via INTRAVENOUS

## 2015-02-12 MED ORDER — SODIUM CHLORIDE 0.9 % IV SOLN
510.0000 mg | Freq: Once | INTRAVENOUS | Status: AC
  Administered 2015-02-12: 510 mg via INTRAVENOUS
  Filled 2015-02-12: qty 17

## 2015-02-12 NOTE — Patient Instructions (Signed)
Ferumoxytol injection What is this medicine? FERUMOXYTOL is an iron complex. Iron is used to make healthy red blood cells, which carry oxygen and nutrients throughout the body. This medicine is used to treat iron deficiency anemia in people with chronic kidney disease. This medicine may be used for other purposes; ask your health care provider or pharmacist if you have questions. What should I tell my health care provider before I take this medicine? They need to know if you have any of these conditions: -anemia not caused by low iron levels -high levels of iron in the blood -magnetic resonance imaging (MRI) test scheduled -an unusual or allergic reaction to iron, other medicines, foods, dyes, or preservatives -pregnant or trying to get pregnant -breast-feeding How should I use this medicine? This medicine is for injection into a vein. It is given by a health care professional in a hospital or clinic setting. Talk to your pediatrician regarding the use of this medicine in children. Special care may be needed. Overdosage: If you think you have taken too much of this medicine contact a poison control center or emergency room at once. NOTE: This medicine is only for you. Do not share this medicine with others. What if I miss a dose? It is important not to miss your dose. Call your doctor or health care professional if you are unable to keep an appointment. What may interact with this medicine? This medicine may interact with the following medications: -other iron products This list may not describe all possible interactions. Give your health care provider a list of all the medicines, herbs, non-prescription drugs, or dietary supplements you use. Also tell them if you smoke, drink alcohol, or use illegal drugs. Some items may interact with your medicine. What should I watch for while using this medicine? Visit your doctor or healthcare professional regularly. Tell your doctor or healthcare  professional if your symptoms do not start to get better or if they get worse. You may need blood work done while you are taking this medicine. You may need to follow a special diet. Talk to your doctor. Foods that contain iron include: whole grains/cereals, dried fruits, beans, or peas, leafy green vegetables, and organ meats (liver, kidney). What side effects may I notice from receiving this medicine? Side effects that you should report to your doctor or health care professional as soon as possible: -allergic reactions like skin rash, itching or hives, swelling of the face, lips, or tongue -breathing problems -changes in blood pressure -feeling faint or lightheaded, falls -fever or chills -flushing, sweating, or hot feelings -swelling of the ankles or feet Side effects that usually do not require medical attention (Report these to your doctor or health care professional if they continue or are bothersome.): -diarrhea -headache -nausea, vomiting -stomach pain This list may not describe all possible side effects. Call your doctor for medical advice about side effects. You may report side effects to FDA at 1-800-FDA-1088. Where should I keep my medicine? This drug is given in a hospital or clinic and will not be stored at home. NOTE: This sheet is a summary. It may not cover all possible information. If you have questions about this medicine, talk to your doctor, pharmacist, or health care provider.    2016, Elsevier/Gold Standard. (2011-12-01 15:23:36)    Iron Deficiency Anemia, Adult Anemia is a condition in which there are less red blood cells or hemoglobin in the blood than normal. Hemoglobin is the part of red blood cells that carries oxygen.   Iron deficiency anemia is anemia caused by too little iron. It is the most common type of anemia. It may leave you tired and short of breath. CAUSES   Lack of iron in the diet.  Poor absorption of iron, as seen with intestinal  disorders.  Intestinal bleeding.  Heavy periods. SIGNS AND SYMPTOMS  Mild anemia may not be noticeable. Symptoms may include:  Fatigue.  Headache.  Pale skin.  Weakness.  Tiredness.  Shortness of breath.  Dizziness.  Cold hands and feet.  Fast or irregular heartbeat. DIAGNOSIS  Diagnosis requires a thorough evaluation and physical exam by your health care provider. Blood tests are generally used to confirm iron deficiency anemia. Additional tests may be done to find the underlying cause of your anemia. These may include:  Testing for blood in the stool (fecal occult blood test).  A procedure to see inside the colon and rectum (colonoscopy).  A procedure to see inside the esophagus and stomach (endoscopy). TREATMENT  Iron deficiency anemia is treated by correcting the cause of the deficiency. Treatment may involve:  Adding iron-rich foods to your diet.  Taking iron supplements. Pregnant or breastfeeding women need to take extra iron because their normal diet usually does not provide the required amount.  Taking vitamins. Vitamin C improves the absorption of iron. Your health care provider may recommend that you take your iron tablets with a glass of orange juice or vitamin C supplement.  Medicines to make heavy menstrual flow lighter.  Surgery. HOME CARE INSTRUCTIONS   Take iron as directed by your health care provider.  If you cannot tolerate taking iron supplements by mouth, talk to your health care provider about taking them through a vein (intravenously) or an injection into a muscle.  For the best iron absorption, iron supplements should be taken on an empty stomach. If you cannot tolerate them on an empty stomach, you may need to take them with food.  Do not drink milk or take antacids at the same time as your iron supplements. Milk and antacids may interfere with the absorption of iron.  Iron supplements can cause constipation. Make sure to include fiber  in your diet to prevent constipation. A stool softener may also be recommended.  Take vitamins as directed by your health care provider.  Eat a diet rich in iron. Foods high in iron include liver, lean beef, whole-grain bread, eggs, dried fruit, and dark green leafy vegetables. SEEK IMMEDIATE MEDICAL CARE IF:   You faint. If this happens, do not drive. Call your local emergency services (911 in [place] if no other help is available.  You have chest pain.  You feel nauseous or vomit.  You have severe or increased shortness of breath with activity.  You feel weak.  You have a rapid heartbeat.  You have unexplained sweating.  You become light-headed when getting up from a chair or bed. MAKE SURE YOU:   Understand these instructions.  Will watch your condition.  Will get help right away if you are not doing well or get worse.   This information is not intended to replace advice given to you by your health care provider. Make sure you discuss any questions you have with your health care provider.   Document Released: 04/14/2000 Document Revised: 05/08/2014 Document Reviewed: 12/23/2012 Elsevier Interactive Patient Education 2016 Elsevier Inc.  

## 2015-02-19 ENCOUNTER — Ambulatory Visit (HOSPITAL_BASED_OUTPATIENT_CLINIC_OR_DEPARTMENT_OTHER): Payer: Medicare Other

## 2015-02-19 VITALS — BP 141/63 | HR 82 | Temp 98.2°F | Resp 20

## 2015-02-19 DIAGNOSIS — D509 Iron deficiency anemia, unspecified: Secondary | ICD-10-CM

## 2015-02-19 DIAGNOSIS — D508 Other iron deficiency anemias: Secondary | ICD-10-CM

## 2015-02-19 MED ORDER — SODIUM CHLORIDE 0.9 % IV SOLN
Freq: Once | INTRAVENOUS | Status: AC
  Administered 2015-02-19: 09:00:00 via INTRAVENOUS

## 2015-02-19 MED ORDER — SODIUM CHLORIDE 0.9 % IV SOLN
510.0000 mg | Freq: Once | INTRAVENOUS | Status: AC
  Administered 2015-02-19: 510 mg via INTRAVENOUS
  Filled 2015-02-19: qty 17

## 2015-02-19 NOTE — Patient Instructions (Signed)
Ferumoxytol injection What is this medicine? FERUMOXYTOL is an iron complex. Iron is used to make healthy red blood cells, which carry oxygen and nutrients throughout the body. This medicine is used to treat iron deficiency anemia in people with chronic kidney disease. This medicine may be used for other purposes; ask your health care provider or pharmacist if you have questions. What should I tell my health care provider before I take this medicine? They need to know if you have any of these conditions: -anemia not caused by low iron levels -high levels of iron in the blood -magnetic resonance imaging (MRI) test scheduled -an unusual or allergic reaction to iron, other medicines, foods, dyes, or preservatives -pregnant or trying to get pregnant -breast-feeding How should I use this medicine? This medicine is for injection into a vein. It is given by a health care professional in a hospital or clinic setting. Talk to your pediatrician regarding the use of this medicine in children. Special care may be needed. Overdosage: If you think you have taken too much of this medicine contact a poison control center or emergency room at once. NOTE: This medicine is only for you. Do not share this medicine with others. What if I miss a dose? It is important not to miss your dose. Call your doctor or health care professional if you are unable to keep an appointment. What may interact with this medicine? This medicine may interact with the following medications: -other iron products This list may not describe all possible interactions. Give your health care provider a list of all the medicines, herbs, non-prescription drugs, or dietary supplements you use. Also tell them if you smoke, drink alcohol, or use illegal drugs. Some items may interact with your medicine. What should I watch for while using this medicine? Visit your doctor or healthcare professional regularly. Tell your doctor or healthcare  professional if your symptoms do not start to get better or if they get worse. You may need blood work done while you are taking this medicine. You may need to follow a special diet. Talk to your doctor. Foods that contain iron include: whole grains/cereals, dried fruits, beans, or peas, leafy green vegetables, and organ meats (liver, kidney). What side effects may I notice from receiving this medicine? Side effects that you should report to your doctor or health care professional as soon as possible: -allergic reactions like skin rash, itching or hives, swelling of the face, lips, or tongue -breathing problems -changes in blood pressure -feeling faint or lightheaded, falls -fever or chills -flushing, sweating, or hot feelings -swelling of the ankles or feet Side effects that usually do not require medical attention (Report these to your doctor or health care professional if they continue or are bothersome.): -diarrhea -headache -nausea, vomiting -stomach pain This list may not describe all possible side effects. Call your doctor for medical advice about side effects. You may report side effects to FDA at 1-800-FDA-1088. Where should I keep my medicine? This drug is given in a hospital or clinic and will not be stored at home. NOTE: This sheet is a summary. It may not cover all possible information. If you have questions about this medicine, talk to your doctor, pharmacist, or health care provider.    2016, Elsevier/Gold Standard. (2011-12-01 15:23:36)    Iron Deficiency Anemia, Adult Anemia is a condition in which there are less red blood cells or hemoglobin in the blood than normal. Hemoglobin is the part of red blood cells that carries oxygen.   Iron deficiency anemia is anemia caused by too little iron. It is the most common type of anemia. It may leave you tired and short of breath. CAUSES   Lack of iron in the diet.  Poor absorption of iron, as seen with intestinal  disorders.  Intestinal bleeding.  Heavy periods. SIGNS AND SYMPTOMS  Mild anemia may not be noticeable. Symptoms may include:  Fatigue.  Headache.  Pale skin.  Weakness.  Tiredness.  Shortness of breath.  Dizziness.  Cold hands and feet.  Fast or irregular heartbeat. DIAGNOSIS  Diagnosis requires a thorough evaluation and physical exam by your health care provider. Blood tests are generally used to confirm iron deficiency anemia. Additional tests may be done to find the underlying cause of your anemia. These may include:  Testing for blood in the stool (fecal occult blood test).  A procedure to see inside the colon and rectum (colonoscopy).  A procedure to see inside the esophagus and stomach (endoscopy). TREATMENT  Iron deficiency anemia is treated by correcting the cause of the deficiency. Treatment may involve:  Adding iron-rich foods to your diet.  Taking iron supplements. Pregnant or breastfeeding women need to take extra iron because their normal diet usually does not provide the required amount.  Taking vitamins. Vitamin C improves the absorption of iron. Your health care provider may recommend that you take your iron tablets with a glass of orange juice or vitamin C supplement.  Medicines to make heavy menstrual flow lighter.  Surgery. HOME CARE INSTRUCTIONS   Take iron as directed by your health care provider.  If you cannot tolerate taking iron supplements by mouth, talk to your health care provider about taking them through a vein (intravenously) or an injection into a muscle.  For the best iron absorption, iron supplements should be taken on an empty stomach. If you cannot tolerate them on an empty stomach, you may need to take them with food.  Do not drink milk or take antacids at the same time as your iron supplements. Milk and antacids may interfere with the absorption of iron.  Iron supplements can cause constipation. Make sure to include fiber  in your diet to prevent constipation. A stool softener may also be recommended.  Take vitamins as directed by your health care provider.  Eat a diet rich in iron. Foods high in iron include liver, lean beef, whole-grain bread, eggs, dried fruit, and dark green leafy vegetables. SEEK IMMEDIATE MEDICAL CARE IF:   You faint. If this happens, do not drive. Call your local emergency services (911 in [place] if no other help is available.  You have chest pain.  You feel nauseous or vomit.  You have severe or increased shortness of breath with activity.  You feel weak.  You have a rapid heartbeat.  You have unexplained sweating.  You become light-headed when getting up from a chair or bed. MAKE SURE YOU:   Understand these instructions.  Will watch your condition.  Will get help right away if you are not doing well or get worse.   This information is not intended to replace advice given to you by your health care provider. Make sure you discuss any questions you have with your health care provider.   Document Released: 04/14/2000 Document Revised: 05/08/2014 Document Reviewed: 12/23/2012 Elsevier Interactive Patient Education 2016 Elsevier Inc.  

## 2015-06-04 ENCOUNTER — Ambulatory Visit (HOSPITAL_BASED_OUTPATIENT_CLINIC_OR_DEPARTMENT_OTHER): Payer: Medicare Other | Admitting: Oncology

## 2015-06-04 ENCOUNTER — Other Ambulatory Visit (HOSPITAL_BASED_OUTPATIENT_CLINIC_OR_DEPARTMENT_OTHER): Payer: Medicare Other

## 2015-06-04 VITALS — BP 172/84 | HR 95 | Temp 97.9°F | Resp 19 | Wt 229.6 lb

## 2015-06-04 DIAGNOSIS — E119 Type 2 diabetes mellitus without complications: Secondary | ICD-10-CM

## 2015-06-04 DIAGNOSIS — J449 Chronic obstructive pulmonary disease, unspecified: Secondary | ICD-10-CM | POA: Diagnosis not present

## 2015-06-04 DIAGNOSIS — D509 Iron deficiency anemia, unspecified: Secondary | ICD-10-CM

## 2015-06-04 DIAGNOSIS — D508 Other iron deficiency anemias: Secondary | ICD-10-CM

## 2015-06-04 LAB — CBC WITH DIFFERENTIAL/PLATELET
BASO%: 0.3 % (ref 0.0–2.0)
Basophils Absolute: 0 10*3/uL (ref 0.0–0.1)
EOS ABS: 0.1 10*3/uL (ref 0.0–0.5)
EOS%: 1 % (ref 0.0–7.0)
HCT: 46 % (ref 34.8–46.6)
HGB: 15.4 g/dL (ref 11.6–15.9)
LYMPH%: 38.8 % (ref 14.0–49.7)
MCH: 28.3 pg (ref 25.1–34.0)
MCHC: 33.5 g/dL (ref 31.5–36.0)
MCV: 84.4 fL (ref 79.5–101.0)
MONO#: 0.4 10*3/uL (ref 0.1–0.9)
MONO%: 5.6 % (ref 0.0–14.0)
NEUT%: 54.3 % (ref 38.4–76.8)
NEUTROS ABS: 3.9 10*3/uL (ref 1.5–6.5)
PLATELETS: 182 10*3/uL (ref 145–400)
RBC: 5.45 10*6/uL (ref 3.70–5.45)
RDW: 13.8 % (ref 11.2–14.5)
WBC: 7.1 10*3/uL (ref 3.9–10.3)
lymph#: 2.8 10*3/uL (ref 0.9–3.3)

## 2015-06-04 LAB — FERRITIN: FERRITIN: 57 ng/mL (ref 9–269)

## 2015-06-04 LAB — IRON AND TIBC
%SAT: 25 % (ref 21–57)
IRON: 87 ug/dL (ref 41–142)
TIBC: 345 ug/dL (ref 236–444)
UIBC: 258 ug/dL (ref 120–384)

## 2015-06-04 NOTE — Progress Notes (Signed)
Hematology and Oncology Follow Up Visit  Courtney [name] [id number] [birth year] 70 y.o. 06/04/2015 9:15 AM [name], MD  Principle Diagnosis: 70 year old women with Iron deficiency anemia diagnosed in 2012. Etiology of her iron deficiency is unclear her GI workup was unrevealing.  Prior Therapy: She received IV Feraheme in December 2012. Most recently repeated in October 2016. She received a total of 1 g of Feraheme.  Current therapy: Oral Fe supplement daily. She has stopped taking at this time.  Interim History:  Courtney [name] returns today for a follow up by herself. Since the last visit, she received intravenous iron in October 2016 and tolerated it well. She reports her energy has improved and has not reported any weakness, fatigue or dyspnea on exertion. She has not reported any hematochezia, melanoma or hemoptysis. She has not resumed her oral iron because of side effects associated with it.  She continues to perform activities of daily living without any decline. Her ambulation is a little bit limited because of balance and unsteadiness but no major changes in her performance status.  She denies any headaches or blurry vision. She denies chest pain, shortness of breath or dyspnea on exertion. No bleeding noted at this time. Does not report any syncope. Does not report any weight loss or appetite changes. He denies any cough or hemoptysis. Does not report any skeletal complaints. Remainder of her review of systems is unremarkable.  Medications: I have reviewed the patient's current medications.   Current Outpatient Prescriptions  Medication Sig Dispense Refill  . albuterol (PROVENTIL HFA;VENTOLIN HFA) 108 (90 BASE) MCG/ACT inhaler Inhale 2 puffs into the lungs every 6 (six) hours as needed. For shortness of breath    . budesonide-formoterol (SYMBICORT) 160-4.5 MCG/ACT inhaler Inhale 2 puffs into the lungs 2 (two) times daily.    . Calcium Carbonate-Vitamin D (CALTRATE 600+D) 600-400  MG-UNIT per chew tablet Chew 1 tablet by mouth 2 (two) times daily.      . cholecalciferol (VITAMIN D) 1000 UNITS tablet Take 2,000 Units by mouth daily.      Marland Kitchen docusate sodium (COLACE) 100 MG capsule Take 100 mg by mouth 2 (two) times daily.     . Linaclotide (LINZESS) 145 MCG CAPS capsule Take 145 mcg by mouth daily.    . metFORMIN (GLUCOPHAGE) 500 MG tablet Take 1,000 mg by mouth 2 (two) times daily with a meal. Patient also takes 500 mg at lunch    . metoprolol succinate (TOPROL-XL) 25 MG 24 hr tablet Take 25 mg by mouth daily.     . Polyethylene Glycol 3350 POWD by Does not apply route.     No current facility-administered medications for this visit.     Allergies:  Allergies  Allergen Reactions  . Eggs Or Egg-Derived Products   . Penicillins     Past Medical History, Surgical history, Social history, and Family History were reviewed and updated.   Physical Exam: Blood pressure 172/84, pulse 95, temperature 97.9 F (36.6 C), temperature source Oral, resp. rate 19, weight 229 lb 9.6 oz (104.146 kg), SpO2 100 %. ECOG: 1 General appearance: alert and cooperative appeared comfortable. Head: Normocephalic, without obvious abnormality no oral thrush. Neck: no adenopathy Lymph nodes: Cervical, supraclavicular, and axillary nodes normal. Heart:regular rate and rhythm, S1, S2 normal, no murmur, click, rub or gallop Lung: Clear to auscultation without rhonchi or wheezes. Abdomen: soft, non-tender, without masses or organomegaly. Obese without shifting dullness or ascites EXT:no erythema, induration, or nodules   Lab Results:  Lab Results  Component Value Date   WBC 7.1 06/04/2015   HGB 15.4 06/04/2015   HCT 46.0 06/04/2015   MCV 84.4 06/04/2015   PLT 182 06/04/2015     Chemistry      Component Value Date/Time   NA 142 08/06/2014 0854   NA 142 08/22/2011 1426   K 4.8 08/06/2014 0854   K 4.2 08/22/2011 1426   CL 103 01/26/2012 1242   CL 103 08/22/2011 1426   CO2 29  08/06/2014 0854   CO2 27 08/22/2011 1426   BUN 12.1 08/06/2014 0854   BUN 11 08/22/2011 1426   CREATININE 1.1 08/06/2014 0854   CREATININE 0.70 08/22/2011 1426      Component Value Date/Time   CALCIUM 10.6* 08/06/2014 0854   CALCIUM 9.9 08/22/2011 1426   ALKPHOS 140 08/06/2014 0854   ALKPHOS 156* 08/22/2011 1426   AST 12 08/06/2014 0854   AST 19 08/22/2011 1426   ALT 15 08/06/2014 0854   ALT 17 08/22/2011 1426   BILITOT 0.29 08/06/2014 0854   BILITOT 0.4 08/22/2011 1426      Results for Courtney [name], Courtney [name] (MRN [id number]) as of 06/04/2015 09:08  Ref. Range 02/04/2014 12:47 08/06/2014 08:54 02/05/2015 08:41  Iron Latest Ref Range: 41-142 ug/dL 70 57 43  UIBC Latest Ref Range: 120-384 ug/dL 409 811 914 (H)  TIBC Latest Ref Range: 236-444 ug/dL 782 956 213 (H)  %SAT Latest Ref Range: 21-57 % 17 (L) 13 (L) 10 (L)  Ferritin Latest Ref Range: 9-269 ng/ml Impression and Plan:  This is a 70 year old female with the following issues:  1. Iron deficiency anemia: The etiology is unclear and her GI workup was unremarkable including a colonoscopy. She had IV iron in the past given her poor tolerance to oral iron and inability to absorb it. She is status post IV iron in October 2016 with improvement in her hemoglobin as well as clinical symptoms. She declined taking oral iron at this time.  The plan is to continue with active surveillance and repeat iron studies in 4-6 months and supplement with IV iron as needed.  2. Coronary artery disease: She has now stopped her aspirin, but remains on Plavix. This could be contributing to microscopic blood losses and exacerbating her GI blood losses potentially.  3. COPD: On albuterol and Symbicort. Her respiratory status is stable. She has not reported any recent exacerbation.  4. Diabetes mellitus: Followed by her primary care physician.  5. Follow up: In 5 months with a CBC and iron studies.     [facility], MD 2/3/20179:15 AM

## 2015-11-17 ENCOUNTER — Other Ambulatory Visit (HOSPITAL_BASED_OUTPATIENT_CLINIC_OR_DEPARTMENT_OTHER): Payer: Medicare Other

## 2015-11-17 ENCOUNTER — Ambulatory Visit (HOSPITAL_BASED_OUTPATIENT_CLINIC_OR_DEPARTMENT_OTHER): Payer: Medicare Other | Admitting: Oncology

## 2015-11-17 ENCOUNTER — Telehealth: Payer: Self-pay | Admitting: Oncology

## 2015-11-17 VITALS — BP 140/69 | HR 84 | Temp 97.8°F | Resp 18 | Ht 62.0 in | Wt 233.9 lb

## 2015-11-17 DIAGNOSIS — E119 Type 2 diabetes mellitus without complications: Secondary | ICD-10-CM

## 2015-11-17 DIAGNOSIS — D509 Iron deficiency anemia, unspecified: Secondary | ICD-10-CM

## 2015-11-17 DIAGNOSIS — I2510 Atherosclerotic heart disease of native coronary artery without angina pectoris: Secondary | ICD-10-CM

## 2015-11-17 LAB — CBC WITH DIFFERENTIAL/PLATELET
BASO%: 0.4 % (ref 0.0–2.0)
BASOS ABS: 0 10*3/uL (ref 0.0–0.1)
EOS ABS: 0.1 10*3/uL (ref 0.0–0.5)
EOS%: 1.1 % (ref 0.0–7.0)
HEMATOCRIT: 44 % (ref 34.8–46.6)
HEMOGLOBIN: 14.3 g/dL (ref 11.6–15.9)
LYMPH#: 2.7 10*3/uL (ref 0.9–3.3)
LYMPH%: 33.2 % (ref 14.0–49.7)
MCH: 27 pg (ref 25.1–34.0)
MCHC: 32.5 g/dL (ref 31.5–36.0)
MCV: 82.9 fL (ref 79.5–101.0)
MONO#: 0.6 10*3/uL (ref 0.1–0.9)
MONO%: 7.6 % (ref 0.0–14.0)
NEUT#: 4.6 10*3/uL (ref 1.5–6.5)
NEUT%: 57.7 % (ref 38.4–76.8)
Platelets: 197 10*3/uL (ref 145–400)
RBC: 5.31 10*6/uL (ref 3.70–5.45)
RDW: 14.4 % (ref 11.2–14.5)
WBC: 8 10*3/uL (ref 3.9–10.3)

## 2015-11-17 LAB — FERRITIN: FERRITIN: 13 ng/mL (ref 9–269)

## 2015-11-17 LAB — IRON AND TIBC
%SAT: 11 % — ABNORMAL LOW (ref 21–57)
Iron: 46 ug/dL (ref 41–142)
TIBC: 406 ug/dL (ref 236–444)
UIBC: 360 ug/dL (ref 120–384)

## 2015-11-17 NOTE — Telephone Encounter (Signed)
Gave pt cal & avs °

## 2015-11-17 NOTE — Progress Notes (Signed)
Hematology and Oncology Follow Up Visit  Courtney [name] [id number] 06/03/1945 70 y.o. 11/17/2015 9:57 AM [name], MD  Principle Diagnosis: 70 year old women with Iron deficiency anemia diagnosed in 2012. Etiology of her iron deficiency is unclear her GI workup was unrevealing.  Prior Therapy: She received IV Feraheme in December 2012. Most recently repeated in October 2016. She received a total of 1 g of Feraheme.  Current therapy: Oral Fe supplement which she has been taking intermittently.  Interim History:  Courtney [name] returns today for a follow up by herself. Since the last visit, she reports no major changes in her health. She reports her energy remains stable and denied any weakness, fatigue or dyspnea on exertion. She has not reported any hematochezia, melanoma or hemoptysis. She has not resumed her oral iron because of side effects associated with it. She denied any difficulties with ambulation as well as falls or syncope.  She denies any headaches or blurry vision. She denies chest pain, shortness of breath or dyspnea on exertion. No bleeding noted at this time. Does not report any syncope. Does not report any weight loss or appetite changes. He denies any cough or hemoptysis. Does not report any skeletal complaints. Remainder of her review of systems is unremarkable.  Medications: I have reviewed the patient's current medications.   Current Outpatient Prescriptions  Medication Sig Dispense Refill  . albuterol (PROVENTIL HFA;VENTOLIN HFA) 108 (90 BASE) MCG/ACT inhaler Inhale 2 puffs into the lungs every 6 (six) hours as needed. For shortness of breath    . budesonide-formoterol (SYMBICORT) 160-4.5 MCG/ACT inhaler Inhale 2 puffs into the lungs 2 (two) times daily.    . Calcium Carbonate-Vitamin D (CALTRATE 600+D) 600-400 MG-UNIT per chew tablet Chew 1 tablet by mouth 2 (two) times daily.      . cholecalciferol (VITAMIN D) 1000 UNITS tablet Take 2,000 Units by mouth daily.      Marland Kitchen.  docusate sodium (COLACE) 100 MG capsule Take 100 mg by mouth 2 (two) times daily.     . Linaclotide (LINZESS) 145 MCG CAPS capsule Take 145 mcg by mouth daily.    . metFORMIN (GLUCOPHAGE) 500 MG tablet Take 1,000 mg by mouth 2 (two) times daily with a meal. Patient also takes 500 mg at lunch    . metoprolol succinate (TOPROL-XL) 25 MG 24 hr tablet Take 25 mg by mouth daily.     . Polyethylene Glycol 3350 POWD by Does not apply route.     No current facility-administered medications for this visit.     Allergies:  Allergies  Allergen Reactions  . Eggs Or Egg-Derived Products   . Penicillins     Past Medical History, Surgical history, Social history, and Family History were reviewed and updated.   Physical Exam: Blood pressure 140/69, pulse 84, temperature 97.8 F (36.6 C), temperature source Oral, resp. rate 18, height 5\' 2"  (1.575 m), weight 233 lb 14.4 oz (106.096 kg), SpO2 100 %. ECOG: 1 General appearance: Alert, awake woman appeared without distress Head: Normocephalic, without obvious abnormality no oral ulcers or lesions. Neck: no adenopathy Lymph nodes: Cervical, supraclavicular, and axillary nodes normal. Heart:regular rate and rhythm, S1, S2 normal, no murmur, click, rub or gallop Lung: Clear to auscultation without rhonchi or wheezes. No dullness to percussion. Abdomen: soft, non-tender, without masses or organomegaly. No shifting dullness or ascites. EXT:no erythema, induration, or nodules   Lab Results: Lab Results  Component Value Date   WBC 8.0 11/17/2015   HGB 14.3 11/17/2015  HCT 44.0 11/17/2015   MCV 82.9 11/17/2015   PLT 197 11/17/2015     Chemistry      Component Value Date/Time   NA 142 08/06/2014 0854   NA 142 08/22/2011 1426   K 4.8 08/06/2014 0854   K 4.2 08/22/2011 1426   CL 103 01/26/2012 1242   CL 103 08/22/2011 1426   CO2 29 08/06/2014 0854   CO2 27 08/22/2011 1426   BUN 12.1 08/06/2014 0854   BUN 11 08/22/2011 1426   CREATININE 1.1  08/06/2014 0854   CREATININE 0.70 08/22/2011 1426      Component Value Date/Time   CALCIUM 10.6* 08/06/2014 0854   CALCIUM 9.9 08/22/2011 1426   ALKPHOS 140 08/06/2014 0854   ALKPHOS 156* 08/22/2011 1426   AST 12 08/06/2014 0854   AST 19 08/22/2011 1426   ALT 15 08/06/2014 0854   ALT 17 08/22/2011 1426   BILITOT 0.29 08/06/2014 0854   BILITOT 0.4 08/22/2011 1426       Results for KASHAE, CARSTENS (MRN [id number]) as of 11/17/2015 09:47  Ref. Range 06/04/2015 08:50  Iron Latest Ref Range: 41-142 ug/dL 87  UIBC Latest Ref Range: 120-384 ug/dL 119  TIBC Latest Ref Range: 236-444 ug/dL 147  %SAT Latest Ref Range: 21-57 % 25  Ferritin Latest Ref Range: 9-269 ng/ml 57      Impression and Plan:  This is a 71 year old female with the following issues:  1. Iron deficiency anemia: The etiology is unclear and her GI workup was unremarkable including a colonoscopy.   She status post IV iron in October 2016 with normalization of her iron studies in February 2017. Her hemoglobin on 11/17/2015 appeared within normal range.  The plan is to continue with active surveillance and repeat iron studies in 4-6 months and supplement with IV iron as needed.  2. Coronary artery disease: She has now stopped her aspirin, but remains on Plavix. This could be contributing to microscopic blood losses and exacerbating her GI blood losses potentially. No recent cardiac exacerbations.  3. COPD: On albuterol and Symbicort. Her respiratory status is stable. She has not reported any recent exacerbation.  4. Diabetes mellitus: Followed by her primary care physician.  5. Follow up: In 6 months with a CBC and iron studies.     [employer], MD 7/19/20179:57 AM

## 2016-05-18 ENCOUNTER — Telehealth: Payer: Self-pay | Admitting: Oncology

## 2016-05-18 NOTE — Telephone Encounter (Signed)
Patient called to cancel due to inclement weather and needs to reschedule  °

## 2016-05-19 ENCOUNTER — Ambulatory Visit: Payer: Medicare Other | Admitting: Oncology

## 2016-05-19 ENCOUNTER — Other Ambulatory Visit: Payer: Medicare Other

## 2016-05-26 ENCOUNTER — Telehealth: Payer: Self-pay | Admitting: Oncology

## 2016-05-26 NOTE — Telephone Encounter (Signed)
1/19 appointment rescheduled per patient request due to inclement weather. R/s to next avail. Patient notified.

## 2016-06-30 ENCOUNTER — Other Ambulatory Visit (HOSPITAL_BASED_OUTPATIENT_CLINIC_OR_DEPARTMENT_OTHER): Payer: Medicare Other

## 2016-06-30 ENCOUNTER — Telehealth: Payer: Self-pay | Admitting: Oncology

## 2016-06-30 ENCOUNTER — Ambulatory Visit (HOSPITAL_BASED_OUTPATIENT_CLINIC_OR_DEPARTMENT_OTHER): Payer: Medicare Other | Admitting: Oncology

## 2016-06-30 VITALS — BP 181/89 | HR 82 | Temp 97.7°F | Resp 17 | Ht 62.0 in | Wt 230.1 lb

## 2016-06-30 DIAGNOSIS — J449 Chronic obstructive pulmonary disease, unspecified: Secondary | ICD-10-CM | POA: Diagnosis not present

## 2016-06-30 DIAGNOSIS — D509 Iron deficiency anemia, unspecified: Secondary | ICD-10-CM

## 2016-06-30 DIAGNOSIS — E119 Type 2 diabetes mellitus without complications: Secondary | ICD-10-CM | POA: Diagnosis not present

## 2016-06-30 DIAGNOSIS — D500 Iron deficiency anemia secondary to blood loss (chronic): Secondary | ICD-10-CM

## 2016-06-30 LAB — CBC WITH DIFFERENTIAL/PLATELET
BASO%: 0.5 % (ref 0.0–2.0)
BASOS ABS: 0 10*3/uL (ref 0.0–0.1)
EOS%: 1.5 % (ref 0.0–7.0)
Eosinophils Absolute: 0.1 10*3/uL (ref 0.0–0.5)
HCT: 42 % (ref 34.8–46.6)
HGB: 13.5 g/dL (ref 11.6–15.9)
LYMPH#: 3.1 10*3/uL (ref 0.9–3.3)
LYMPH%: 34.4 % (ref 14.0–49.7)
MCH: 25 pg — AB (ref 25.1–34.0)
MCHC: 32.1 g/dL (ref 31.5–36.0)
MCV: 77.8 fL — ABNORMAL LOW (ref 79.5–101.0)
MONO#: 0.6 10*3/uL (ref 0.1–0.9)
MONO%: 6.5 % (ref 0.0–14.0)
NEUT#: 5.1 10*3/uL (ref 1.5–6.5)
NEUT%: 57.1 % (ref 38.4–76.8)
Platelets: 210 10*3/uL (ref 145–400)
RBC: 5.41 10*6/uL (ref 3.70–5.45)
RDW: 15.2 % — ABNORMAL HIGH (ref 11.2–14.5)
WBC: 8.9 10*3/uL (ref 3.9–10.3)

## 2016-06-30 LAB — IRON AND TIBC
%SAT: 8 % — ABNORMAL LOW (ref 21–57)
IRON: 37 ug/dL — AB (ref 41–142)
TIBC: 449 ug/dL — AB (ref 236–444)
UIBC: 412 ug/dL — AB (ref 120–384)

## 2016-06-30 LAB — FERRITIN: Ferritin: 8 ng/ml — ABNORMAL LOW (ref 9–269)

## 2016-06-30 NOTE — Progress Notes (Signed)
Hematology and Oncology Follow Up Visit  Courtney [name] [id number] 11/14/1945 71 y.o. 06/30/2016 8:16 AM Courtney Partridge, MD  Principle Diagnosis: 71 year old women with Iron deficiency anemia diagnosed in 2012. Etiology of her iron deficiency is unclear her GI workup was unrevealing.  Prior Therapy: Intermittent IV iron infusion Most recently repeated in October 2016. She received a total of 1 g of Feraheme.  Current therapy: Observation. She started oral iron because of poor tolerance.  Interim History:  Courtney [name] returns today for a follow up by herself. Since the last visit, she reports slight decline in her overall energy and performance status. She is reporting slight fatigue as well as cold intolerance. She is also reporting increase cravings. She has not reported any hematochezia, melena or epistaxis. She denied hemoptysis. She reports that she has not been taking oral iron because of poor tolerance including dyspepsia and constipation. She is up-to-date on her colonoscopy. She is reporting more difficulties with ambulation related to arthritis but have not had any syncope or seizures.  She denies any headaches or blurry vision. She denies chest pain, shortness of breath or dyspnea on exertion. No bleeding noted at this time. Does not report any syncope. Does not report any weight loss or appetite changes. He denies any cough or hemoptysis. Does not report any skeletal complaints. Remainder of her review of systems is unremarkable.  Medications: I have reviewed the patient's current medications.   Current Outpatient Prescriptions  Medication Sig Dispense Refill  . albuterol (PROVENTIL HFA;VENTOLIN HFA) 108 (90 BASE) MCG/ACT inhaler Inhale 2 puffs into the lungs every 6 (six) hours as needed. For shortness of breath    . budesonide-formoterol (SYMBICORT) 160-4.5 MCG/ACT inhaler Inhale 2 puffs into the lungs 2 (two) times daily.    . Calcium Carbonate-Vitamin [name] (CALTRATE 600+[name]) 600-400  MG-UNIT per chew tablet Chew 1 tablet by mouth 2 (two) times daily.      . cholecalciferol (VITAMIN [name]) 1000 UNITS tablet Take 2,000 Units by mouth daily.      Marland Kitchen docusate sodium (COLACE) 100 MG capsule Take 100 mg by mouth 2 (two) times daily.     . Linaclotide (LINZESS) 145 MCG CAPS capsule Take 145 mcg by mouth daily.    . metFORMIN (GLUCOPHAGE) 500 MG tablet Take 1,000 mg by mouth 2 (two) times daily with a meal. Patient also takes 500 mg at lunch    . metoprolol succinate (TOPROL-XL) 25 MG 24 hr tablet Take 25 mg by mouth daily.     . Polyethylene Glycol 3350 POWD by Does not apply route.     No current facility-administered medications for this visit.      Allergies:  Allergies  Allergen Reactions  . Eggs Or Egg-Derived Products   . Penicillins     Past Medical History, Surgical history, Social history, and Family History were reviewed and updated.   Physical Exam: Blood pressure (!) 181/89, pulse 82, temperature 97.7 F (36.5 C), temperature source Oral, resp. rate 17, height 5\' 2"  (1.575 m), weight 230 lb 1.6 oz (104.4 kg), SpO2 100 %. ECOG: 1 General appearance: Alert, awake gentleman appeared comfortable. Head: Normocephalic, without obvious abnormality no oral thrush or ulcers. Neck: no adenopathy Lymph nodes: Cervical, supraclavicular, and axillary nodes normal. Heart:regular rate and rhythm, S1, S2 normal, no murmur, click, rub or gallop Lung: Clear to auscultation without rhonchi or wheezes. No dullness to percussion. Abdomen: soft, non-tender, without masses or organomegaly. No rebound or guarding. EXT:no erythema, induration, or nodules  Lab Results: Lab Results  Component Value Date   WBC 8.9 06/30/2016   HGB 13.5 06/30/2016   HCT 42.0 06/30/2016   MCV 77.8 (L) 06/30/2016   PLT 210 06/30/2016     Chemistry      Component Value Date/Time   NA 142 08/06/2014 0854   K 4.8 08/06/2014 0854   CL 103 01/26/2012 1242   CO2 29 08/06/2014 0854   BUN 12.1  08/06/2014 0854   CREATININE 1.1 08/06/2014 0854      Component Value Date/Time   CALCIUM 10.6 (H) 08/06/2014 0854   ALKPHOS 140 08/06/2014 0854   AST 12 08/06/2014 0854   ALT 15 08/06/2014 0854   BILITOT 0.29 08/06/2014 0854       Results for Courtney [name], Courtney [name] (MRN [id number]) as of 06/30/2016 08:07  Ref. Range 06/04/2015 08:50 11/17/2015 09:37  Iron Latest Ref Range: 41 - 142 ug/dL 87 46  UIBC Latest Ref Range: 120 - 384 ug/dL [id number]  TIBC Latest Ref Range: 236 - 444 ug/dL [id number]  %SAT Latest Ref Range: 21 - 57 % 25 11 (L)  Ferritin Latest Ref Range: 9 - 269 ng/ml 57 13       Impression and Plan:  This is a 71 year old female with the following issues:  1. Iron deficiency anemia: Related to poor oral absorption of iron. GI workup in the past has been unrevealing including colonoscopy.  She status post IV iron in October 2016 with normalization of her iron studies in February 2017.   Her hemoglobin is normal today but she is developing microcytosis and clinical signs of iron deficiency. Her iron studies in July 2017 indicating early signs of that and those were repeated today.  Risks and benefits of repeat intravenous iron were discussed today. Complications associated with Feraheme including arthralgias, myalgias and rarely anaphylaxis. She is agreeable to proceed with treatment in the near future. If iron stores are not fully repleted in the near future, she will likely develop more symptoms related to anemia.  2. Colonoscopy screening: She appears to be up-to-date. I urged her to make sure she follow-up with her colonoscopy screening when is indicated.  3. COPD: On albuterol and Symbicort. Her respiratory status is stable. She has not reported any recent exacerbation.  4. Diabetes mellitus: Followed by her primary care physician.  5. Follow up: In 6 months with a CBC and iron studies.     [facility],Damauri Minion, MD 3/2/20188:16 AM

## 2016-06-30 NOTE — Telephone Encounter (Signed)
Appointments scheduled per 3/2 LOS. Patient given AVS report and calendars with future scheduled appointments. °

## 2016-07-07 ENCOUNTER — Ambulatory Visit (HOSPITAL_BASED_OUTPATIENT_CLINIC_OR_DEPARTMENT_OTHER): Payer: Medicare Other

## 2016-07-07 VITALS — BP 175/81 | HR 88 | Temp 98.4°F | Resp 16

## 2016-07-07 DIAGNOSIS — D509 Iron deficiency anemia, unspecified: Secondary | ICD-10-CM

## 2016-07-07 DIAGNOSIS — D500 Iron deficiency anemia secondary to blood loss (chronic): Secondary | ICD-10-CM

## 2016-07-07 MED ORDER — SODIUM CHLORIDE 0.9 % IV SOLN
Freq: Once | INTRAVENOUS | Status: AC
  Administered 2016-07-07: 09:00:00 via INTRAVENOUS

## 2016-07-07 MED ORDER — SODIUM CHLORIDE 0.9 % IV SOLN
510.0000 mg | Freq: Once | INTRAVENOUS | Status: AC
  Administered 2016-07-07: 510 mg via INTRAVENOUS
  Filled 2016-07-07: qty 17

## 2016-07-07 NOTE — Patient Instructions (Signed)
Ferumoxytol injection What is this medicine? FERUMOXYTOL is an iron complex. Iron is used to make healthy red blood cells, which carry oxygen and nutrients throughout the body. This medicine is used to treat iron deficiency anemia in people with chronic kidney disease. This medicine may be used for other purposes; ask your health care provider or pharmacist if you have questions. COMMON BRAND NAME(S): Feraheme What should I tell my health care provider before I take this medicine? They need to know if you have any of these conditions: -anemia not caused by low iron levels -high levels of iron in the blood -magnetic resonance imaging (MRI) test scheduled -an unusual or allergic reaction to iron, other medicines, foods, dyes, or preservatives -pregnant or trying to get pregnant -breast-feeding How should I use this medicine? This medicine is for injection into a vein. It is given by a health care professional in a hospital or clinic setting. Talk to your pediatrician regarding the use of this medicine in children. Special care may be needed. Overdosage: If you think you have taken too much of this medicine contact a poison control center or emergency room at once. NOTE: This medicine is only for you. Do not share this medicine with others. What if I miss a dose? It is important not to miss your dose. Call your doctor or health care professional if you are unable to keep an appointment. What may interact with this medicine? This medicine may interact with the following medications: -other iron products This list may not describe all possible interactions. Give your health care provider a list of all the medicines, herbs, non-prescription drugs, or dietary supplements you use. Also tell them if you smoke, drink alcohol, or use illegal drugs. Some items may interact with your medicine. What should I watch for while using this medicine? Visit your doctor or healthcare professional regularly. Tell  your doctor or healthcare professional if your symptoms do not start to get better or if they get worse. You may need blood work done while you are taking this medicine. You may need to follow a special diet. Talk to your doctor. Foods that contain iron include: whole grains/cereals, dried fruits, beans, or peas, leafy green vegetables, and organ meats (liver, kidney). What side effects may I notice from receiving this medicine? Side effects that you should report to your doctor or health care professional as soon as possible: -allergic reactions like skin rash, itching or hives, swelling of the face, lips, or tongue -breathing problems -changes in blood pressure -feeling faint or lightheaded, falls -fever or chills -flushing, sweating, or hot feelings -swelling of the ankles or feet Side effects that usually do not require medical attention (report to your doctor or health care professional if they continue or are bothersome): -diarrhea -headache -nausea, vomiting -stomach pain This list may not describe all possible side effects. Call your doctor for medical advice about side effects. You may report side effects to FDA at 1-800-FDA-1088. Where should I keep my medicine? This drug is given in a hospital or clinic and will not be stored at home. NOTE: This sheet is a summary. It may not cover all possible information. If you have questions about this medicine, talk to your doctor, pharmacist, or health care provider.  2018 Elsevier/Gold Standard (2015-05-20 12:41:49)    Iron Deficiency Anemia, Adult Iron deficiency anemia is a condition in which the concentration of red blood cells or hemoglobin in the blood is below normal because of too little iron. Hemoglobin is   a substance in red blood cells that carries oxygen to the body's tissues. When the concentration of red blood cells or hemoglobin is too low, not enough oxygen reaches these tissues. Iron deficiency anemia is usually  long-lasting (chronic) and it develops over time. It may or may not cause symptoms. It is a common type of anemia. What are the causes? This condition may be caused by:  Not enough iron in the diet.  Blood loss caused by bleeding in the intestine.  Blood loss from a gastrointestinal condition like Crohn disease.  Frequent blood draws, such as from blood donation.  Abnormal absorption in the gut.  Heavy menstrual periods in women.  Cancers of the gastrointestinal system, such as colon cancer. What are the signs or symptoms? Symptoms of this condition may include:  Fatigue.  Headache.  Pale skin, lips, and nail beds.  Poor appetite.  Weakness.  Shortness of breath.  Dizziness.  Cold hands and feet.  Fast or irregular heartbeat.  Irritability. This is more common in severe anemia.  Rapid breathing. This is more common in severe anemia. Mild anemia may not cause any symptoms. How is this diagnosed? This condition is diagnosed based on:  Your medical history.  A physical exam.  Blood tests. You may have additional tests to find the underlying cause of your anemia, such as:  Testing for blood in the stool (fecal occult blood test).  A procedure to see inside your colon and rectum (colonoscopy).  A procedure to see inside your esophagus and stomach (endoscopy).  A test in which cells are removed from bone marrow (bone marrow aspiration) or fluid is removed from the bone marrow to be examined (biopsy). This is rarely needed. How is this treated? This condition is treated by correcting the cause of your iron deficiency. Treatment may involve:  Adding iron-rich foods to your diet.  Taking iron supplements. If you are pregnant or breastfeeding, you may need to take extra iron because your normal diet usually does not provide the amount of iron that you need.  Increasing vitamin C intake. Vitamin C helps your body absorb iron. Your health care provider may  recommend that you take iron supplements along with a glass of orange juice or a vitamin C supplement.  Medicines to make heavy menstrual flow lighter.  Surgery. You may need repeat blood tests to determine whether treatment is working. Depending on the underlying cause, the anemia should be corrected within 2 months of starting treatment. If the treatment does not seem to be working, you may need more testing. Follow these instructions at home: Medicines   Take over-the-counter and prescription medicines only as told by your health care provider. This includes iron supplements and vitamins.  If you cannot tolerate taking iron supplements by mouth, talk with your health care provider about taking them through a vein (intravenously) or an injection into a muscle.  For the best iron absorption, you should take iron supplements when your stomach is empty. If you cannot tolerate them on an empty stomach, you may need to take them with food.  Do not drink milk or take antacids at the same time as your iron supplements. Milk and antacids may interfere with iron absorption.  Iron supplements can cause constipation. To prevent constipation, include fiber in your diet as told by your health care provider. A stool softener may also be recommended. Eating and drinking   Talk with your health care provider before changing your diet. He or she may   recommend that you eat foods that contain a lot of iron, such as:  Liver.  Low-fat (lean) beef.  Breads and cereals that have iron added to them (are fortified).  Eggs.  Dried fruit.  Dark green, leafy vegetables.  To help your body use the iron from iron-rich foods, eat those foods at the same time as fresh fruits and vegetables that are high in vitamin C. Foods that are high in vitamin C include:  Oranges.  Peppers.  Tomatoes.  Mangoes.  Drinkenoughfluid to keep your urine clear or pale yellow. General instructions   Return to your  normal activities as told by your health care provider. Ask your health care provider what activities are safe for you.  Practice good hygiene. Anemia can make you more prone to illness and infection.  Keep all follow-up visits as told by your health care provider. This is important. Contact a health care provider if:  You feel nauseous or you vomit.  You feel weak.  You have unexplained sweating.  You develop symptoms of constipation, such as:  Having fewer than three bowel movements a week.  Straining to have a bowel movement.  Having stools that are hard, dry, or larger than normal.  Feeling full or bloated.  Pain in the lower abdomen.  Not feeling relief after having a bowel movement. Get help right away if:  You faint. If this happens, do not drive yourself to the hospital. Call your local emergency services (911 in the [place]  You have chest pain.  You have shortness of breath that:  Is severe.  Gets worse with physical activity.  You have a rapid heartbeat.  You become light-headed when getting up from a sitting or lying down position. This information is not intended to replace advice given to you by your health care provider. Make sure you discuss any questions you have with your health care provider. Document Released: 04/14/2000 Document Revised: 01/05/2016 Document Reviewed: 01/05/2016 Elsevier Interactive Patient Education  2017 Elsevier Inc.   

## 2016-07-14 ENCOUNTER — Ambulatory Visit (HOSPITAL_BASED_OUTPATIENT_CLINIC_OR_DEPARTMENT_OTHER): Payer: Medicare Other

## 2016-07-14 VITALS — BP 159/67 | HR 85 | Temp 98.2°F | Resp 17

## 2016-07-14 DIAGNOSIS — D509 Iron deficiency anemia, unspecified: Secondary | ICD-10-CM | POA: Diagnosis present

## 2016-07-14 DIAGNOSIS — D500 Iron deficiency anemia secondary to blood loss (chronic): Secondary | ICD-10-CM

## 2016-07-14 MED ORDER — SODIUM CHLORIDE 0.9 % IV SOLN
510.0000 mg | Freq: Once | INTRAVENOUS | Status: AC
  Administered 2016-07-14: 510 mg via INTRAVENOUS
  Filled 2016-07-14: qty 17

## 2016-07-14 MED ORDER — SODIUM CHLORIDE 0.9 % IV SOLN
Freq: Once | INTRAVENOUS | Status: AC
  Administered 2016-07-14: 10:00:00 via INTRAVENOUS

## 2016-07-14 NOTE — Patient Instructions (Signed)
Ferumoxytol injection What is this medicine? FERUMOXYTOL is an iron complex. Iron is used to make healthy red blood cells, which carry oxygen and nutrients throughout the body. This medicine is used to treat iron deficiency anemia in people with chronic kidney disease. This medicine may be used for other purposes; ask your health care provider or pharmacist if you have questions. COMMON BRAND NAME(S): Feraheme What should I tell my health care provider before I take this medicine? They need to know if you have any of these conditions: -anemia not caused by low iron levels -high levels of iron in the blood -magnetic resonance imaging (MRI) test scheduled -an unusual or allergic reaction to iron, other medicines, foods, dyes, or preservatives -pregnant or trying to get pregnant -breast-feeding How should I use this medicine? This medicine is for injection into a vein. It is given by a health care professional in a hospital or clinic setting. Talk to your pediatrician regarding the use of this medicine in children. Special care may be needed. Overdosage: If you think you have taken too much of this medicine contact a poison control center or emergency room at once. NOTE: This medicine is only for you. Do not share this medicine with others. What if I miss a dose? It is important not to miss your dose. Call your doctor or health care professional if you are unable to keep an appointment. What may interact with this medicine? This medicine may interact with the following medications: -other iron products This list may not describe all possible interactions. Give your health care provider a list of all the medicines, herbs, non-prescription drugs, or dietary supplements you use. Also tell them if you smoke, drink alcohol, or use illegal drugs. Some items may interact with your medicine. What should I watch for while using this medicine? Visit your doctor or healthcare professional regularly. Tell  your doctor or healthcare professional if your symptoms do not start to get better or if they get worse. You may need blood work done while you are taking this medicine. You may need to follow a special diet. Talk to your doctor. Foods that contain iron include: whole grains/cereals, dried fruits, beans, or peas, leafy green vegetables, and organ meats (liver, kidney). What side effects may I notice from receiving this medicine? Side effects that you should report to your doctor or health care professional as soon as possible: -allergic reactions like skin rash, itching or hives, swelling of the face, lips, or tongue -breathing problems -changes in blood pressure -feeling faint or lightheaded, falls -fever or chills -flushing, sweating, or hot feelings -swelling of the ankles or feet Side effects that usually do not require medical attention (report to your doctor or health care professional if they continue or are bothersome): -diarrhea -headache -nausea, vomiting -stomach pain This list may not describe all possible side effects. Call your doctor for medical advice about side effects. You may report side effects to FDA at 1-800-FDA-1088. Where should I keep my medicine? This drug is given in a hospital or clinic and will not be stored at home. NOTE: This sheet is a summary. It may not cover all possible information. If you have questions about this medicine, talk to your doctor, pharmacist, or health care provider.  2018 Elsevier/Gold Standard (2015-05-20 12:41:49)    Iron Deficiency Anemia, Adult Iron deficiency anemia is a condition in which the concentration of red blood cells or hemoglobin in the blood is below normal because of too little iron. Hemoglobin is   a substance in red blood cells that carries oxygen to the body's tissues. When the concentration of red blood cells or hemoglobin is too low, not enough oxygen reaches these tissues. Iron deficiency anemia is usually  long-lasting (chronic) and it develops over time. It may or may not cause symptoms. It is a common type of anemia. What are the causes? This condition may be caused by:  Not enough iron in the diet.  Blood loss caused by bleeding in the intestine.  Blood loss from a gastrointestinal condition like Crohn disease.  Frequent blood draws, such as from blood donation.  Abnormal absorption in the gut.  Heavy menstrual periods in women.  Cancers of the gastrointestinal system, such as colon cancer. What are the signs or symptoms? Symptoms of this condition may include:  Fatigue.  Headache.  Pale skin, lips, and nail beds.  Poor appetite.  Weakness.  Shortness of breath.  Dizziness.  Cold hands and feet.  Fast or irregular heartbeat.  Irritability. This is more common in severe anemia.  Rapid breathing. This is more common in severe anemia. Mild anemia may not cause any symptoms. How is this diagnosed? This condition is diagnosed based on:  Your medical history.  A physical exam.  Blood tests. You may have additional tests to find the underlying cause of your anemia, such as:  Testing for blood in the stool (fecal occult blood test).  A procedure to see inside your colon and rectum (colonoscopy).  A procedure to see inside your esophagus and stomach (endoscopy).  A test in which cells are removed from bone marrow (bone marrow aspiration) or fluid is removed from the bone marrow to be examined (biopsy). This is rarely needed. How is this treated? This condition is treated by correcting the cause of your iron deficiency. Treatment may involve:  Adding iron-rich foods to your diet.  Taking iron supplements. If you are pregnant or breastfeeding, you may need to take extra iron because your normal diet usually does not provide the amount of iron that you need.  Increasing vitamin C intake. Vitamin C helps your body absorb iron. Your health care provider may  recommend that you take iron supplements along with a glass of orange juice or a vitamin C supplement.  Medicines to make heavy menstrual flow lighter.  Surgery. You may need repeat blood tests to determine whether treatment is working. Depending on the underlying cause, the anemia should be corrected within 2 months of starting treatment. If the treatment does not seem to be working, you may need more testing. Follow these instructions at home: Medicines   Take over-the-counter and prescription medicines only as told by your health care provider. This includes iron supplements and vitamins.  If you cannot tolerate taking iron supplements by mouth, talk with your health care provider about taking them through a vein (intravenously) or an injection into a muscle.  For the best iron absorption, you should take iron supplements when your stomach is empty. If you cannot tolerate them on an empty stomach, you may need to take them with food.  Do not drink milk or take antacids at the same time as your iron supplements. Milk and antacids may interfere with iron absorption.  Iron supplements can cause constipation. To prevent constipation, include fiber in your diet as told by your health care provider. A stool softener may also be recommended. Eating and drinking   Talk with your health care provider before changing your diet. He or she may   recommend that you eat foods that contain a lot of iron, such as:  Liver.  Low-fat (lean) beef.  Breads and cereals that have iron added to them (are fortified).  Eggs.  Dried fruit.  Dark green, leafy vegetables.  To help your body use the iron from iron-rich foods, eat those foods at the same time as fresh fruits and vegetables that are high in vitamin C. Foods that are high in vitamin C include:  Oranges.  Peppers.  Tomatoes.  Mangoes.  Drinkenoughfluid to keep your urine clear or pale yellow. General instructions   Return to your  normal activities as told by your health care provider. Ask your health care provider what activities are safe for you.  Practice good hygiene. Anemia can make you more prone to illness and infection.  Keep all follow-up visits as told by your health care provider. This is important. Contact a health care provider if:  You feel nauseous or you vomit.  You feel weak.  You have unexplained sweating.  You develop symptoms of constipation, such as:  Having fewer than three bowel movements a week.  Straining to have a bowel movement.  Having stools that are hard, dry, or larger than normal.  Feeling full or bloated.  Pain in the lower abdomen.  Not feeling relief after having a bowel movement. Get help right away if:  You faint. If this happens, do not drive yourself to the hospital. Call your local emergency services (911 in the [place]  You have chest pain.  You have shortness of breath that:  Is severe.  Gets worse with physical activity.  You have a rapid heartbeat.  You become light-headed when getting up from a sitting or lying down position. This information is not intended to replace advice given to you by your health care provider. Make sure you discuss any questions you have with your health care provider. Document Released: 04/14/2000 Document Revised: 01/05/2016 Document Reviewed: 01/05/2016 Elsevier Interactive Patient Education  2017 Elsevier Inc.   

## 2016-08-16 ENCOUNTER — Other Ambulatory Visit: Payer: Self-pay | Admitting: Family Medicine

## 2016-08-16 DIAGNOSIS — Z1231 Encounter for screening mammogram for malignant neoplasm of breast: Secondary | ICD-10-CM

## 2016-08-18 ENCOUNTER — Encounter: Payer: Self-pay | Admitting: Interventional Cardiology

## 2016-08-20 NOTE — Progress Notes (Signed)
Cardiology Office Note   Date:  08/21/2016   ID:  Courtney, [name] 05-26-1945, MRN [id number]  PCP:  Willow Ora, MD    No chief complaint on file.  CAD  Wt Readings from Last 3 Encounters:  08/21/16 230 lb (104.3 kg)  06/30/16 230 lb 1.6 oz (104.4 kg)  11/17/15 233 lb 14.4 oz (106.1 kg)       History of Present Illness: Courtney [name] is a 71 y.o. female  Who had an inferior STEMI in 01/2010.  SHe had an RCA stent at that time. Mild left sided disease at that time.  Courtney [name] is a 71 y.o. female who is being seen today for the evaluation of CAD at the request of Lewis Moccasin, MD.  I have not seen her in many years.   She is now on insulin for her diabetes.  She walks regularly without any chest pain.  No sx like prior MI.    Her only pain is in the back of her right shoulder, depending on her position of her arm.    She was told the cholesterol was high, but no change has been made at this time.  LDL was 131 in Jan 2018.      Past Medical History:  Diagnosis Date  . Anemia   . Arthritis   . Asthma   . CAD (coronary artery disease)   . COPD (chronic obstructive pulmonary disease) (HCC)   . Diabetes mellitus   . Hyperlipidemia   . Hypertension   . Myocardial infarct (HCC)   . Postmenopausal atrophic vaginitis   . Restless leg syndrome     Past Surgical History:  Procedure Laterality Date  . CHOLECYSTECTOMY  1970's  . OTHER SURGICAL HISTORY  1970's   hysterectomy     Current Outpatient Prescriptions  Medication Sig Dispense Refill  . albuterol (PROVENTIL HFA;VENTOLIN HFA) 108 (90 BASE) MCG/ACT inhaler Inhale 2 puffs into the lungs every 6 (six) hours as needed. For shortness of breath    . aspirin EC 81 MG tablet Take by mouth.    . budesonide-formoterol (SYMBICORT) 160-4.5 MCG/ACT inhaler Inhale 2 puffs into the lungs 2 (two) times daily.    . Calcium Carbonate-Vitamin D (CALTRATE 600+D) 600-400 MG-UNIT per chew tablet Chew 1  tablet by mouth 2 (two) times daily.      . cholecalciferol (VITAMIN D) 1000 UNITS tablet Take 2,000 Units by mouth daily.      Marland Kitchen docusate sodium (COLACE) 100 MG capsule Take 100 mg by mouth 2 (two) times daily.     . metFORMIN (GLUCOPHAGE) 500 MG tablet Take 1,000 mg by mouth 2 (two) times daily with a meal. Patient also takes 500 mg at lunch    . metoprolol succinate (TOPROL-XL) 25 MG 24 hr tablet Take 25 mg by mouth daily.     . Polyethylene Glycol 3350 POWD by Does not apply route.    Marland Kitchen lisinopril (PRINIVIL,ZESTRIL) 5 MG tablet Take 1 tablet (5 mg total) by mouth daily. 90 tablet 3  . rosuvastatin (CRESTOR) 10 MG tablet Take 1 tablet (10 mg total) by mouth daily. 30 tablet 6   No current facility-administered medications for this visit.     Allergies:   Eggs or egg-derived products and Penicillins    Social History:  The patient  reports that she has been smoking.  She has been smoking about 0.50 packs per day. She has never used smokeless tobacco. She reports that  she does not drink alcohol or use drugs.   Family History:  The patient's family history includes Hypertension in her brother.    ROS:  Please see the history of present illness.   Otherwise, review of systems are positive for right shoulder pain.   All other systems are reviewed and negative.    PHYSICAL EXAM: VS:  BP 138/68   Pulse 87   Ht  (1.575 m)   Wt 230 lb (104.3 kg)   BMI 42.07 kg/m  , BMI Body mass index is 42.07 kg/m. GEN: Well nourished, well developed, in no acute distress  HEENT: normal  Neck: no JVD, carotid bruits, or masses Cardiac: RRR; no murmurs, rubs, or gallops,no edema  Respiratory:  clear to auscultation bilaterally, normal work of breathing GI: soft, nontender, nondistended, + BS MS: no deformity or atrophy  Skin: warm and dry, no rash Neuro:  Strength and sensation are intact Psych: euthymic mood, full affect   EKG:   The ekg ordered today demonstrates NSR, PVCs, nonspecific ST  changes   Recent Labs: 06/30/2016: HGB 13.5; Platelets 210   Lipid Panel    Component Value Date/Time   CHOL  02/12/2010 0500    154        ATP III CLASSIFICATION:  <200     mg/dL   Desirable  098-119  mg/dL   Borderline High  >=147    mg/dL   High          TRIG 829 02/12/2010 0500   HDL 42 02/12/2010 0500   CHOLHDL 3.7 02/12/2010 0500   VLDL 22 02/12/2010 0500   LDLCALC  02/12/2010 0500    90        Total Cholesterol/HDL:CHD Risk Coronary Heart Disease Risk Table                     Men   Women  1/2 Average Risk   3.4   3.3  Average Risk       5.0   4.4  2 X Average Risk   9.6   7.1  3 X Average Risk  23.4   11.0        Use the calculated Patient Ratio above and the CHD Risk Table to determine the patient's CHD Risk.        ATP III CLASSIFICATION (LDL):  <100     mg/dL   Optimal  562-130  mg/dL   Near or Above                    Optimal  130-159  mg/dL   Borderline  865-784  mg/dL   High  >696     mg/dL   Very High     Other studies Reviewed: Additional studies/ records that were reviewed today with results demonstrating: Report reviewed. Lab results reviewed as noted below. Anemia improved..   ASSESSMENT AND PLAN:  1. CAD: Anginal symptoms controlled on medical therapy. She should be on a statin for her hyperlipidemia. Start rosuvastatin 10 mg daily. Will check liver and lipids in about 6 weeks.  She had back pain on atorvastatin in the past.  THe back and shoulder pain continues but she stopped the atorvastatin because of this sx. 2. Hypertensive heart disease: Given her diabetes, would like to see blood pressure less than 130/80. She would benefit from ACE inhibitor. Start lisinopril 5 mg daily. Check electrolytes in the next 1-2 weeks. She will also follow-up in our  hypertension clinic. The lisinopril can be increased at that time based on her blood pressure readings. 3. Diabetes: He will A1c was over 12 in early 2018. It is come down to around 10. 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   Current medicines are reviewed at length with the patient today.  The patient concerns regarding her medicines were addressed.  The following changes have been made:    Labs/ tests ordered today include:   Orders Placed This Encounter  Procedures  . Basic metabolic panel  . Lipid panel  . Hepatic function panel  . EKG 12-Lead    Recommend 150 minutes/week of aerobic exercise Low fat, low carb, high fiber diet recommended  Disposition:   FU in 2 weeks, HTN clinic; f/u lipids in 6 weeks.  1 year with me.   Signed, Lance Muss, MD  08/21/2016 9:52 AM    [facility] [address] [place], [place], [place]  [address] Phone: [phone]; Fax: [phone]

## 2016-08-21 ENCOUNTER — Telehealth: Payer: Self-pay

## 2016-08-21 ENCOUNTER — Encounter: Payer: Self-pay | Admitting: Interventional Cardiology

## 2016-08-21 ENCOUNTER — Ambulatory Visit (INDEPENDENT_AMBULATORY_CARE_PROVIDER_SITE_OTHER): Payer: Medicare Other | Admitting: Interventional Cardiology

## 2016-08-21 ENCOUNTER — Encounter (INDEPENDENT_AMBULATORY_CARE_PROVIDER_SITE_OTHER): Payer: Self-pay

## 2016-08-21 VITALS — BP 138/68 | HR 87 | Ht 62.0 in | Wt 230.0 lb

## 2016-08-21 DIAGNOSIS — E1159 Type 2 diabetes mellitus with other circulatory complications: Secondary | ICD-10-CM | POA: Diagnosis not present

## 2016-08-21 DIAGNOSIS — E785 Hyperlipidemia, unspecified: Secondary | ICD-10-CM | POA: Diagnosis not present

## 2016-08-21 DIAGNOSIS — I252 Old myocardial infarction: Secondary | ICD-10-CM

## 2016-08-21 DIAGNOSIS — Z79899 Other long term (current) drug therapy: Secondary | ICD-10-CM

## 2016-08-21 DIAGNOSIS — I25119 Atherosclerotic heart disease of native coronary artery with unspecified angina pectoris: Secondary | ICD-10-CM | POA: Diagnosis not present

## 2016-08-21 DIAGNOSIS — I10 Essential (primary) hypertension: Secondary | ICD-10-CM

## 2016-08-21 DIAGNOSIS — I119 Hypertensive heart disease without heart failure: Secondary | ICD-10-CM | POA: Diagnosis not present

## 2016-08-21 MED ORDER — ROSUVASTATIN CALCIUM 10 MG PO TABS: 10.0000 mg | ORAL_TABLET | Freq: Every day | ORAL | 6 refills | Status: DC

## 2016-08-21 MED ORDER — LISINOPRIL 5 MG PO TABS
5.0000 mg | ORAL_TABLET | Freq: Every day | ORAL | 3 refills | Status: DC
Start: 2016-08-21 — End: 2016-09-04

## 2016-08-21 NOTE — Telephone Encounter (Signed)
**Note De-Identified  Obfuscation** Received approval on the pts Rosuvastatin from [facility]. Approval good from 07/22/16 until 08/21/17. Case ID (PBP Code): [id number]

## 2016-08-21 NOTE — Telephone Encounter (Signed)
Rosuvastatin PA done through covermymeds. Awaiting response.

## 2016-08-21 NOTE — Patient Instructions (Signed)
Your physician has recommended you make the following change in your medication:  START  CRESTOR 10 MG  EVERY DAY  START LISINOPRIL  5 MG  EVERY DAY    Your physician recommends that you return for lab work in:  1-2  WEEKS   BMET 6 WEEKS  FASTING LIPID  AND LIVER  Your physician recommends that you schedule a follow-up appointment in: 1 -2 WEEKS   B/P  CLINIC  AND  BMET  SAME DAY  Your physician wants you to follow-up in: YEAR WITH DR Lyn Hollingshead will receive a reminder letter in the mail two months in advance. If you don't receive a letter, please call our office to schedule the follow-up appointment.

## 2016-09-04 ENCOUNTER — Other Ambulatory Visit: Payer: Medicare Other | Admitting: *Deleted

## 2016-09-04 ENCOUNTER — Ambulatory Visit (INDEPENDENT_AMBULATORY_CARE_PROVIDER_SITE_OTHER): Payer: Medicare Other | Admitting: Pharmacist

## 2016-09-04 VITALS — BP 148/78 | HR 91

## 2016-09-04 DIAGNOSIS — E785 Hyperlipidemia, unspecified: Secondary | ICD-10-CM

## 2016-09-04 DIAGNOSIS — I10 Essential (primary) hypertension: Secondary | ICD-10-CM | POA: Diagnosis not present

## 2016-09-04 DIAGNOSIS — I25119 Atherosclerotic heart disease of native coronary artery with unspecified angina pectoris: Secondary | ICD-10-CM

## 2016-09-04 DIAGNOSIS — Z79899 Other long term (current) drug therapy: Secondary | ICD-10-CM

## 2016-09-04 LAB — LIPID PANEL
CHOL/HDL RATIO: 4.3 ratio (ref 0.0–4.4)
CHOLESTEROL TOTAL: 203 mg/dL — AB (ref 100–199)
HDL: 47 mg/dL (ref >39)
LDL CALC: 114 mg/dL — AB (ref 0–99)
TRIGLYCERIDES: 209 mg/dL — AB (ref 0–149)
VLDL Cholesterol Cal: 42 mg/dL — ABNORMAL HIGH (ref 5–40)

## 2016-09-04 LAB — BASIC METABOLIC PANEL
BUN/Creatinine Ratio: 18 (ref 12–28)
BUN: 12 mg/dL (ref 8–27)
CALCIUM: 10.2 mg/dL (ref 8.7–10.3)
CO2: 26 mmol/L (ref 18–29)
CREATININE: 0.67 mg/dL (ref 0.57–1.00)
Chloride: 99 mmol/L (ref 96–106)
GFR calc Af Amer: 101 mL/min/{1.73_m2} (ref >59)
GFR, EST NON AFRICAN AMERICAN: 87 mL/min/{1.73_m2} (ref >59)
GLUCOSE: 335 mg/dL — AB (ref 65–99)
Potassium: 4 mmol/L (ref 3.5–5.2)
SODIUM: 140 mmol/L (ref 134–144)

## 2016-09-04 MED ORDER — LISINOPRIL 5 MG PO TABS: 5.0000 mg | ORAL_TABLET | Freq: Every day | ORAL | 1 refills | Status: DC

## 2016-09-04 NOTE — Patient Instructions (Signed)
Return for a follow up appointment in 3-4 weeks  Check your blood pressure at home daily (if able) and keep record of the readings.  Take your BP meds as follows: START lisinopril 5mg  ONCE DAILY for your HIGH BLOOD PRESSURE  Bring all of your meds, your BP cuff and your record of home blood pressures to your next appointment.  Exercise as you're able, try to walk approximately 30 minutes per day.  Keep salt intake to a minimum, especially watch canned and prepared boxed foods.  Eat more fresh fruits and vegetables and fewer canned items.  Avoid eating in fast food restaurants.

## 2016-09-04 NOTE — Progress Notes (Signed)
Patient ID: Courtney [name]                 DOB: [birth year]                      MRN: [id number]     HPI: Courtney [name] is a 71 y.o. female patient of Dr. Eldridge Dace who presents today for hypertension evaluation. PMH includes inferior STEMI in 01/2010.  SHe had an RCA stent at that time. Her history is positive for CAD, COPD, DM, HTN, HLD. She recently reestablished with Dr. Eldridge Dace and was started on lisinopril for elevated bp.   She has not started the lisinopril as she never picked up from pharmacy. She also report she has not yet had any of her morning medications. She also reports that she has not started rosuvastatin because she was told it "caused health risks." She states that she refuses to take a medication that causes health risks. She states that the person that told her this was with either pharmacy or insurance and they were not more specific than "health risks." Offered to send for another statin medication, but she declines and states that she will not take a medication associated with "health risks." Advised I would make Dr. Eldridge Dace aware.   She reports that she takes insulin in the evening 20 units. She does not know the name of the insulin.   Last cigarette early this morning. She is currently smoking about 1 ppd. She has thought about quitting but it not quite ready to do so currently.   Current HTN meds:  Lisinopril 5mg  daily - has not started Toprol 25mg  daily in the am   BP goal: <130/80  Family History: The patient's family history includes Hypertension in her brother.   Social History: The patient  reports that she has been smoking.  She has been smoking about 1 pack per day. She has never used smokeless tobacco. She reports that she does not drink alcohol or use drugs.   Diet: Eats most meals from out. She does add salt. Drinks 3-4 cups of coffee per day. She drinks 3 glasses of iced tea per day as well. Her other fluid consumption is hawaiian punch.   Exercise: No  exercise. She walks her back yard occasionally.   Home BP readings: No cuff at home.   Wt Readings from Last 3 Encounters:  08/21/16 230 lb (104.3 kg)  06/30/16 230 lb 1.6 oz (104.4 kg)  11/17/15 233 lb 14.4 oz (106.1 kg)   BP Readings from Last 3 Encounters:  09/04/16 (!) 148/78  08/21/16 138/68  07/14/16 (!) 159/67   Pulse Readings from Last 3 Encounters:  09/04/16 91  08/21/16 87  07/14/16 85    Renal function: CrCl cannot be calculated (Patient's most recent lab result is older than the maximum 21 days allowed.).  Past Medical History:  Diagnosis Date  . Anemia   . Arthritis   . Asthma   . CAD (coronary artery disease)   . COPD (chronic obstructive pulmonary disease) (HCC)   . Diabetes mellitus   . Hyperlipidemia   . Hypertension   . Myocardial infarct (HCC)   . Postmenopausal atrophic vaginitis   . Restless leg syndrome     Current Outpatient Prescriptions on File Prior to Visit  Medication Sig Dispense Refill  . albuterol (PROVENTIL HFA;VENTOLIN HFA) 108 (90 BASE) MCG/ACT inhaler Inhale 2 puffs into the lungs every 6 (six) hours as needed. For shortness of  breath    . aspirin EC 81 MG tablet Take by mouth.    . budesonide-formoterol (SYMBICORT) 160-4.5 MCG/ACT inhaler Inhale 2 puffs into the lungs 2 (two) times daily.    . Calcium Carbonate-Vitamin D (CALTRATE 600+D) 600-400 MG-UNIT per chew tablet Chew 1 tablet by mouth 2 (two) times daily.      . metFORMIN (GLUCOPHAGE) 500 MG tablet Take 1,000 mg by mouth 2 (two) times daily with a meal. Patient also takes 500 mg at lunch    . metoprolol succinate (TOPROL-XL) 25 MG 24 hr tablet Take 25 mg by mouth daily.     . cholecalciferol (VITAMIN D) 1000 UNITS tablet Take 2,000 Units by mouth daily.      Marland Kitchen. docusate sodium (COLACE) 100 MG capsule Take 100 mg by mouth 2 (two) times daily.     . rosuvastatin (CRESTOR) 10 MG tablet Take 1 tablet (10 mg total) by mouth daily. (Patient not taking: Reported on 09/04/2016) 30 tablet  6   No current facility-administered medications on file prior to visit.     Allergies  Allergen Reactions  . Eggs Or Egg-Derived Products   . Penicillins     Blood pressure (!) 148/78, pulse 91.   Assessment/Plan: Hypertension: BMET today. Pt has not taken morning medications and she has not started lisinopril. BP elevated today and recommend she start lisinopril 5mg  daily today. She seemed willing to start this medication after a lengthy discussion on benefits and rational for why patient should be on medication. Repeat BMET in 1 month and follow up in HTN clinic.    Thank you, Freddie ApleyKelley M. Cleatis PolkaAuten, PharmD  [facility]  09/04/2016 11:35 AM

## 2016-09-05 ENCOUNTER — Telehealth: Payer: Self-pay

## 2016-09-05 MED ORDER — ROSUVASTATIN CALCIUM 20 MG PO TABS: 20.0000 mg | ORAL_TABLET | Freq: Every day | ORAL | 3 refills | Status: DC

## 2016-09-05 NOTE — Telephone Encounter (Signed)
Attempted to call patient to make her aware of lab results and that her Crestor was increased to 20 mg daily. Left pt a voicemail to call the office. Back. Increased crestor to 20 mg daily per MD order and [facility] [address]

## 2016-09-05 NOTE — Telephone Encounter (Signed)
-----   Message from Corky CraftsJayadeep S Varanasi, MD sent at 09/04/2016  5:46 PM EDT ----- Electrolytes are well controlled. LDL above target.  Increase Crestor to 20 mg daily.

## 2016-09-06 ENCOUNTER — Telehealth: Payer: Self-pay | Admitting: Interventional Cardiology

## 2016-09-06 NOTE — Telephone Encounter (Signed)
Made pt aware of results. Let her know that her Crestor was increased to 20 mg daily and was sent to her preferred pharmacy. Pt states she feels really good. Pt said she would go to pharmacy later today to get her medicine and thanked me for my call.

## 2016-09-06 NOTE — Telephone Encounter (Signed)
Patient is returning your cal,thanks.

## 2016-09-06 NOTE — Telephone Encounter (Signed)
-----   Message from Jayadeep S Varanasi, MD sent at 09/04/2016  5:46 PM EDT ----- Electrolytes are well controlled. LDL above target.  Increase Crestor to 20 mg daily. 

## 2016-09-11 ENCOUNTER — Ambulatory Visit
Admission: RE | Admit: 2016-09-11 | Discharge: 2016-09-11 | Disposition: A | Discharge status: Home / Self Care | Payer: Medicare Other | Source: Ambulatory Visit | Attending: Family Medicine | Admitting: Family Medicine

## 2016-09-11 DIAGNOSIS — Z1231 Encounter for screening mammogram for malignant neoplasm of breast: Secondary | ICD-10-CM

## 2016-10-02 ENCOUNTER — Other Ambulatory Visit: Payer: Medicare Other | Admitting: *Deleted

## 2016-10-02 ENCOUNTER — Ambulatory Visit (INDEPENDENT_AMBULATORY_CARE_PROVIDER_SITE_OTHER): Payer: Medicare Other | Admitting: Pharmacist

## 2016-10-02 VITALS — BP 118/90 | HR 88 | Ht 62.0 in | Wt 226.5 lb

## 2016-10-02 DIAGNOSIS — I10 Essential (primary) hypertension: Secondary | ICD-10-CM | POA: Diagnosis not present

## 2016-10-02 DIAGNOSIS — E785 Hyperlipidemia, unspecified: Secondary | ICD-10-CM

## 2016-10-02 DIAGNOSIS — I25119 Atherosclerotic heart disease of native coronary artery with unspecified angina pectoris: Secondary | ICD-10-CM

## 2016-10-02 MED ORDER — METOPROLOL SUCCINATE ER 25 MG PO TB24: 25.0000 mg | ORAL_TABLET | Freq: Every day | ORAL | 11 refills | Status: DC

## 2016-10-02 NOTE — Patient Instructions (Signed)
Continue taking lisinopril 5mg  daily  Continue taking metoprolol 25mg  once a day  Try to walk for 15 minutes most days a week  Pick up your Crestor (rosuvastatin) 20mg  and start taking it once a day. This is very important for your cholesterol

## 2016-10-02 NOTE — Progress Notes (Signed)
Patient ID: Courtney [name]                 DOB: 08/26/1945                      MRN: [id number]     HPI: Courtney [name] is a 71 y.o. female patient of Dr. Eldridge DaceVaranasi who presents today for hypertension follow up. PMH includes inferior STEMI in 01/2010 with RCA stent, CAD, COPD, DM, HTN, and HLD. She has a history of medication noncompliance and recently reestablished with Dr. Eldridge DaceVaranasi. At last visit, pt was instructed to start lisinopril 5mg  daily (was supposed to start this after previous office visit but states she never picked it up). Also had a lengthy discussion regarding need for statin therapy given patient's history of CAD and DM. She was very hesitant because someone told her that statins "caused health risks."   Pt had her BP checked on 5/17 at her visit with her endocrinologist - BP was controlled at 118/78 at that time. Pt does report that she started taking lisinopril 5mg  daily however she has only been taking it for the past week rather than the last month as instructed. She also never picked up her Crestor to start taking. She is unsure if she is taking her metoprolol and thinks she needs refills, this has been sent in. States she feels better when her blood pressure is down. Denies dizziness or headaches.  Lipid panel 09/04/16 revealed LDL of 114 above goal < 70 and pt was instructed to increase her Crestor to 20mg  daily. She had not been compliant with her 10mg  dose previously. Pt today states she is still not taking cholesterol medication. Discussed the need to lower her LDL given previous history of STEMI.  States she is worried about husband because he was told he has an enlarged prostate. Pt is sedentary and has dietary indiscretion.   Current HTN meds: lisinopril 5mg  daily, Toprol 25mg  daily in the am  Previously tried: amlodipine BP goal: <130/1980mmHg  Family History: The patient's family history includes Hypertension in her brother.  Social History: The patient reports that  she has been smoking. She has been smoking about 1 pack per day. She has never used smokeless tobacco. She reports that she does not drink alcohol or use drugs.  Diet: Eats most meals at restaurants. She does add salt to her food. Drinks 3-4 cups of coffee per day. She drinks 3 glasses of iced tea per day as well. Her other fluid consumption is hawaiian punch.   Exercise: No exercise. She walks her back yard occasionally.   Home BP readings: No cuff at home.   Wt Readings from Last 3 Encounters:  08/21/16 230 lb (104.3 kg)  06/30/16 230 lb 1.6 oz (104.4 kg)  11/17/15 233 lb 14.4 oz (106.1 kg)   BP Readings from Last 3 Encounters:  09/04/16 (!) 148/78  08/21/16 138/68  07/14/16 (!) 159/67   Pulse Readings from Last 3 Encounters:  09/04/16 91  08/21/16 87  07/14/16 85    Renal function: CrCl cannot be calculated (Patient's most recent lab result is older than the maximum 21 days allowed.).  Past Medical History:  Diagnosis Date  . Anemia   . Arthritis   . Asthma   . CAD (coronary artery disease)   . COPD (chronic obstructive pulmonary disease) (HCC)   . Diabetes mellitus   . Hyperlipidemia   . Hypertension   . Myocardial infarct (HCC)   .  Postmenopausal atrophic vaginitis   . Restless leg syndrome     Current Outpatient Prescriptions on File Prior to Visit  Medication Sig Dispense Refill  . albuterol (PROVENTIL HFA;VENTOLIN HFA) 108 (90 BASE) MCG/ACT inhaler Inhale 2 puffs into the lungs every 6 (six) hours as needed. For shortness of breath    . aspirin EC 81 MG tablet Take by mouth.    . budesonide-formoterol (SYMBICORT) 160-4.5 MCG/ACT inhaler Inhale 2 puffs into the lungs 2 (two) times daily.    . Calcium Carbonate-Vitamin D (CALTRATE 600+D) 600-400 MG-UNIT per chew tablet Chew 1 tablet by mouth 2 (two) times daily.      . cholecalciferol (VITAMIN D) 1000 UNITS tablet Take 2,000 Units by mouth daily.      Marland Kitchen docusate sodium (COLACE) 100 MG capsule Take 100 mg by  mouth 2 (two) times daily.     Marland Kitchen lisinopril (PRINIVIL,ZESTRIL) 5 MG tablet Take 1 tablet (5 mg total) by mouth daily. 90 tablet 1  . metFORMIN (GLUCOPHAGE) 500 MG tablet Take 1,000 mg by mouth 2 (two) times daily with a meal. Patient also takes 500 mg at lunch    . metoprolol succinate (TOPROL-XL) 25 MG 24 hr tablet Take 25 mg by mouth daily.     . rosuvastatin (CRESTOR) 20 MG tablet Take 1 tablet (20 mg total) by mouth daily. 90 tablet 3   No current facility-administered medications on file prior to visit.     Allergies  Allergen Reactions  . Eggs Or Egg-Derived Products   . Penicillins      Assessment/Plan:  1. Hypertension - BP improved and close to goal <130/24mmHg. Diastolic reading still slightly elevated, however medication titration is unlikely to change this. Elevated diastolic reading likely secondary to sedentary lifestyle and dietary indiscretion. Instructed pt to continue taking lisinopril 5mg  daily and Toprol 25mg  daily. Pt set a goal to walk for 15 minutes most days a week.  2. Hyperlipidemia - Pt still has not picked up her Crestor and is currently not on any lipid lowering therapy. She states again that someone from the pharmacy and her insurance told her that statins "cause health problems" but cannot be more specific than this. Emphasized patient's need for statin therapy given previous MI. She states she will pick up her Crestor however medication nonadherence has been an issue before. Will need to follow closely.  Follow up in pharmacy clinic as needed.  Veronica Fretz E. Diem Dicocco, PharmD, CPP, BCACP [facility] [address], [place], [place] [address] Phone: [phone]; Fax: [phone] 10/02/2016 9:53 AM

## 2016-10-03 LAB — SPECIMEN STATUS REPORT

## 2016-10-19 LAB — LIPID PANEL
CHOL/HDL RATIO: 4.6 ratio — AB (ref 0.0–4.4)
Cholesterol, Total: 210 mg/dL — ABNORMAL HIGH (ref 100–199)
HDL: 46 mg/dL (ref >39)
LDL Calculated: 137 mg/dL — ABNORMAL HIGH (ref 0–99)
TRIGLYCERIDES: 136 mg/dL (ref 0–149)
VLDL Cholesterol Cal: 27 mg/dL (ref 5–40)

## 2016-10-19 LAB — BASIC METABOLIC PANEL
BUN / CREAT RATIO: 15 (ref 12–28)
BUN: 11 mg/dL (ref 8–27)
CO2: 25 mmol/L (ref 18–29)
Calcium: 10.2 mg/dL (ref 8.7–10.3)
Chloride: 99 mmol/L (ref 96–106)
Creatinine, Ser: 0.74 mg/dL (ref 0.57–1.00)
GFR calc Af Amer: 93 mL/min/{1.73_m2} (ref >59)
GFR, EST NON AFRICAN AMERICAN: 81 mL/min/{1.73_m2} (ref >59)
Glucose: 250 mg/dL — ABNORMAL HIGH (ref 65–99)
POTASSIUM: 4.4 mmol/L (ref 3.5–5.2)
SODIUM: 142 mmol/L (ref 134–144)

## 2016-10-19 LAB — HEPATIC FUNCTION PANEL
ALBUMIN: 4.1 g/dL (ref 3.5–4.8)
ALT: 19 IU/L (ref 0–32)
AST: 14 IU/L (ref 0–40)
Alkaline Phosphatase: 168 IU/L — ABNORMAL HIGH (ref 39–117)
BILIRUBIN TOTAL: 0.2 mg/dL (ref 0.0–1.2)
BILIRUBIN, DIRECT: 0.1 mg/dL (ref 0.00–0.40)
TOTAL PROTEIN: 6.7 g/dL (ref 6.0–8.5)

## 2017-01-05 ENCOUNTER — Other Ambulatory Visit (HOSPITAL_BASED_OUTPATIENT_CLINIC_OR_DEPARTMENT_OTHER): Payer: Medicare Other

## 2017-01-05 ENCOUNTER — Telehealth: Payer: Self-pay | Admitting: Oncology

## 2017-01-05 ENCOUNTER — Ambulatory Visit (HOSPITAL_BASED_OUTPATIENT_CLINIC_OR_DEPARTMENT_OTHER): Payer: Medicare Other | Admitting: Oncology

## 2017-01-05 VITALS — BP 165/96 | HR 85 | Temp 97.6°F | Resp 19 | Ht 62.0 in | Wt 219.5 lb

## 2017-01-05 DIAGNOSIS — D509 Iron deficiency anemia, unspecified: Secondary | ICD-10-CM

## 2017-01-05 DIAGNOSIS — J449 Chronic obstructive pulmonary disease, unspecified: Secondary | ICD-10-CM | POA: Diagnosis not present

## 2017-01-05 DIAGNOSIS — I25119 Atherosclerotic heart disease of native coronary artery with unspecified angina pectoris: Secondary | ICD-10-CM

## 2017-01-05 DIAGNOSIS — D500 Iron deficiency anemia secondary to blood loss (chronic): Secondary | ICD-10-CM

## 2017-01-05 LAB — CBC WITH DIFFERENTIAL/PLATELET
BASO%: 0.2 % (ref 0.0–2.0)
Basophils Absolute: 0 10*3/uL (ref 0.0–0.1)
EOS%: 1.2 % (ref 0.0–7.0)
Eosinophils Absolute: 0.1 10*3/uL (ref 0.0–0.5)
HEMATOCRIT: 47 % — AB (ref 34.8–46.6)
HEMOGLOBIN: 15.4 g/dL (ref 11.6–15.9)
LYMPH#: 3.6 10*3/uL — AB (ref 0.9–3.3)
LYMPH%: 40.1 % (ref 14.0–49.7)
MCH: 28 pg (ref 25.1–34.0)
MCHC: 32.8 g/dL (ref 31.5–36.0)
MCV: 85.5 fL (ref 79.5–101.0)
MONO#: 0.6 10*3/uL (ref 0.1–0.9)
MONO%: 7.1 % (ref 0.0–14.0)
NEUT#: 4.6 10*3/uL (ref 1.5–6.5)
NEUT%: 51.4 % (ref 38.4–76.8)
Platelets: 204 10*3/uL (ref 145–400)
RBC: 5.5 10*6/uL — ABNORMAL HIGH (ref 3.70–5.45)
RDW: 13.9 % (ref 11.2–14.5)
WBC: 9 10*3/uL (ref 3.9–10.3)

## 2017-01-05 LAB — IRON AND TIBC
%SAT: 15 % — AB (ref 21–57)
IRON: 58 ug/dL (ref 41–142)
TIBC: 401 ug/dL (ref 236–444)
UIBC: 343 ug/dL (ref 120–384)

## 2017-01-05 LAB — FERRITIN: Ferritin: 24 ng/ml (ref 9–269)

## 2017-01-05 NOTE — Telephone Encounter (Signed)
Gave patient avs and calendar with appts.  °

## 2017-01-05 NOTE — Progress Notes (Signed)
Hematology and Oncology Follow Up Visit  Courtney [name] [id number] 08/14/1945 71 y.o. 01/05/2017 9:31 AM Courtney Partridgeamille Andy, MD  Principle Diagnosis: 71 year old women with Iron deficiency anemia diagnosed in 2012. Etiology of her iron deficiency is unclear her GI workup was unrevealing.  Prior Therapy: Intermittent IV iron infusion. She received a total of 1 g of Feraheme in March 2018.  Current therapy: Observation. She stopped oral iron because of poor tolerance.  Interim History:  Courtney [name] returns today for a follow up by herself. Since the last visit, she received IV iron in March 2018 and tolerated it well. She noticed significant improvement in her health and performance statutus. She has not reported any hematochezia, melena or epistaxis. She denied hemoptysis. She reports that she has not been taking oral iron because of poor tolerance including dyspepsia and constipation. She reports she is currently on insulin at this time which controlled her blood sugar adequately. Her energy and performance status remains at baseline.  She denies any headaches or blurry vision. She denies chest pain, shortness of breath or dyspnea on exertion. No bleeding noted at this time. Does not report any syncope. Does not report any weight loss or appetite changes. He denies any cough or hemoptysis. Does not report any skeletal complaints. Remainder of her review of systems is unremarkable.  Medications: I have reviewed the patient's current medications.   Current Outpatient Prescriptions  Medication Sig Dispense Refill  . albuterol (PROVENTIL HFA;VENTOLIN HFA) 108 (90 BASE) MCG/ACT inhaler Inhale 2 puffs into the lungs every 6 (six) hours as needed. For shortness of breath    . aspirin EC 81 MG tablet Take by mouth.    . budesonide-formoterol (SYMBICORT) 160-4.5 MCG/ACT inhaler Inhale 2 puffs into the lungs 2 (two) times daily.    . Calcium Carbonate-Vitamin D (CALTRATE 600+D) 600-400 MG-UNIT per chew tablet  Chew 1 tablet by mouth 2 (two) times daily.      . cholecalciferol (VITAMIN D) 1000 UNITS tablet Take 2,000 Units by mouth daily.      Marland Kitchen. docusate sodium (COLACE) 100 MG capsule Take 100 mg by mouth 2 (two) times daily.     Marland Kitchen. lisinopril (PRINIVIL,ZESTRIL) 5 MG tablet Take 1 tablet (5 mg total) by mouth daily. 90 tablet 1  . metFORMIN (GLUCOPHAGE) 500 MG tablet Take 1,000 mg by mouth 2 (two) times daily with a meal. Patient also takes 500 mg at lunch    . metoprolol succinate (TOPROL-XL) 25 MG 24 hr tablet Take 1 tablet (25 mg total) by mouth daily. 30 tablet 11  . rosuvastatin (CRESTOR) 20 MG tablet Take 1 tablet (20 mg total) by mouth daily. 90 tablet 3   No current facility-administered medications for this visit.      Allergies:  Allergies  Allergen Reactions  . Eggs Or Egg-Derived Products   . Penicillins     Past Medical History, Surgical history, Social history, and Family History were reviewed and updated.   Physical Exam: Blood pressure (!) 165/96, pulse 85, temperature 97.6 F (36.4 C), temperature source Oral, resp. rate 19, height 5\' 2"  (1.575 m), weight 219 lb 8 oz (99.6 kg), SpO2 100 %. ECOG: 1 General appearance: Alert, awake woman without distress. Head: Normocephalic, without obvious abnormality no oral ulcers or lesions. Neck: no adenopathy Lymph nodes: Cervical, supraclavicular, and axillary nodes normal. Heart:regular rate and rhythm, S1, S2 normal, no murmur, click, rub or gallop Lung: Clear to auscultation without rhonchi or wheezes. No dullness to percussion. Abdomen: soft,  non-tender, without masses or organomegaly. No shifting dullness or ascites. EXT:no erythema, induration, or nodules   Lab Results: Lab Results  Component Value Date   WBC 9.0 01/05/2017   HGB 15.4 01/05/2017   HCT 47.0 (H) 01/05/2017   MCV 85.5 01/05/2017   PLT 204 01/05/2017     Chemistry      Component Value Date/Time   NA 142 10/02/2016 0856   NA 142 08/06/2014 0854   K  4.4 10/02/2016 0856   K 4.8 08/06/2014 0854   CL 99 10/02/2016 0856   CL 103 01/26/2012 1242   CO2 25 10/02/2016 0856   CO2 29 08/06/2014 0854   BUN 11 10/02/2016 0856   BUN 12.1 08/06/2014 0854   CREATININE 0.74 10/02/2016 0856   CREATININE 1.1 08/06/2014 0854      Component Value Date/Time   CALCIUM 10.2 10/02/2016 0856   CALCIUM 10.6 (H) 08/06/2014 0854   ALKPHOS 168 (H) 10/02/2016 0856   ALKPHOS 140 08/06/2014 0854   AST 14 10/02/2016 0856   AST 12 08/06/2014 0854   ALT 19 10/02/2016 0856   ALT 15 08/06/2014 0854   BILITOT 0.2 10/02/2016 0856   BILITOT 0.29 08/06/2014 0854       Results for Courtney [name] (MRN [id number]) as of 01/05/2017 09:24  Ref. Range 06/30/2016 07:51  Iron Latest Ref Range: 41 - 142 ug/dL 37 (L)  UIBC Latest Ref Range: 120 - 384 ug/dL 409 (H)  TIBC Latest Ref Range: 236 - 444 ug/dL 811 (H)  %SAT Latest Ref Range: 21 - 57 % 8 (L)  Ferritin Latest Ref Range: 9 - 269 ng/ml 8 (L)        Impression and Plan:  71 year old female with the following issues:  1. Iron deficiency anemia: Related to poor oral absorption of iron. GI workup in the past has been unrevealing including colonoscopy.  She status post IV iron in March 2018 with excellent response.  Her hemoglobin is normal at this time and iron studies are pending. The plan is to continue with observation and surveillance and repeat iron studies in 6 months. She will require intermittent IV treatment in the future.  2. Colonoscopy screening: She appears to be up-to-date. I urged her to make sure she follow-up with her colonoscopy screening when is indicated.  3. COPD: On albuterol and Symbicort. No recent exacerbations.  4. Diabetes mellitus: Followed by her primary care physician. She is currently on insulin.  5. Follow up: In 6 months with a CBC and iron studies.     Courtney Hose, MD 9/7/20189:31 AM

## 2017-07-06 ENCOUNTER — Other Ambulatory Visit: Payer: Medicare Other

## 2017-07-06 ENCOUNTER — Ambulatory Visit: Payer: Medicare Other | Admitting: Oncology

## 2017-07-11 ENCOUNTER — Telehealth: Payer: Self-pay | Admitting: Oncology

## 2017-07-11 NOTE — Telephone Encounter (Signed)
Patient called to cancel °

## 2017-07-12 ENCOUNTER — Other Ambulatory Visit: Payer: Medicare Other

## 2017-07-12 ENCOUNTER — Ambulatory Visit: Payer: Medicare Other | Admitting: Oncology

## 2017-08-20 ENCOUNTER — Telehealth: Payer: Self-pay | Admitting: Oncology

## 2017-08-20 NOTE — Telephone Encounter (Signed)
Patient called to reschedule appointment that was missed °

## 2017-10-03 ENCOUNTER — Telehealth: Payer: Self-pay

## 2017-10-03 NOTE — Telephone Encounter (Signed)
Patient called to verify her upcoming appointment.per 6/5 los

## 2017-10-05 ENCOUNTER — Inpatient Hospital Stay: Payer: Medicare Other | Attending: Oncology

## 2017-10-05 ENCOUNTER — Inpatient Hospital Stay (HOSPITAL_BASED_OUTPATIENT_CLINIC_OR_DEPARTMENT_OTHER): Payer: Medicare Other | Admitting: Oncology

## 2017-10-05 VITALS — BP 176/84 | HR 78 | Temp 98.6°F | Resp 17 | Ht 62.0 in | Wt 224.5 lb

## 2017-10-05 DIAGNOSIS — D509 Iron deficiency anemia, unspecified: Secondary | ICD-10-CM | POA: Diagnosis present

## 2017-10-05 DIAGNOSIS — E119 Type 2 diabetes mellitus without complications: Secondary | ICD-10-CM

## 2017-10-05 DIAGNOSIS — J449 Chronic obstructive pulmonary disease, unspecified: Secondary | ICD-10-CM

## 2017-10-05 LAB — IRON AND TIBC
Iron: 45 ug/dL (ref 41–142)
SATURATION RATIOS: 11 % — AB (ref 21–57)
TIBC: 411 ug/dL (ref 236–444)
UIBC: 367 ug/dL

## 2017-10-05 LAB — CBC WITH DIFFERENTIAL/PLATELET
BASOS PCT: 1 %
Basophils Absolute: 0 10*3/uL (ref 0.0–0.1)
EOS ABS: 0.1 10*3/uL (ref 0.0–0.5)
EOS PCT: 1 %
HCT: 42.2 % (ref 34.8–46.6)
HEMOGLOBIN: 13.8 g/dL (ref 11.6–15.9)
Lymphocytes Relative: 36 %
Lymphs Abs: 2.5 10*3/uL (ref 0.9–3.3)
MCH: 26.4 pg (ref 25.1–34.0)
MCHC: 32.7 g/dL (ref 31.5–36.0)
MCV: 80.8 fL (ref 79.5–101.0)
MONO ABS: 0.4 10*3/uL (ref 0.1–0.9)
MONOS PCT: 6 %
Neutro Abs: 4 10*3/uL (ref 1.5–6.5)
Neutrophils Relative %: 56 %
PLATELETS: 233 10*3/uL (ref 145–400)
RBC: 5.23 MIL/uL (ref 3.70–5.45)
RDW: 14.8 % — AB (ref 11.2–14.5)
WBC: 7.1 10*3/uL (ref 3.9–10.3)

## 2017-10-05 LAB — FERRITIN: Ferritin: 11 ng/mL (ref 9–269)

## 2017-10-05 NOTE — Progress Notes (Signed)
Hematology and Oncology Follow Up Visit  Courtney [name] [id number] 07/03/1945 72 y.o. 10/05/2017 10:03 AM [name], MD  Principle Diagnosis: 72 year old women with Iron deficiency anemia due to poor absorption of oral iron noted in 2012.  Her GI work-up did not reveal any blood losses.   Prior Therapy: Intermittent IV iron infusion.   Current therapy: Active surveillance and repeat intravenous iron as needed.  Interim History:  Courtney [name] is here for a follow-up.  Since the last visit, she reports no recent complaints or changes.  She still has limited mobility at home predominantly related to arthritis and weight.  She does drive and able to attend to activities of daily living without any recent decline.  She has not reported any recent hospitalizations or illnesses.  She denies any hematochezia or melena.  She has not reported any dyspnea on exertion or recent hospitalizations.  She does not report any hematochezia or melena.  She denies any headaches or blurry vision.  She denies any alteration in mental status or confusion.  She denies any fevers, chills or sweats.  She denies chest pain, shortness of breath or COPD exacerbation.  She denies any cough, wheezing or hemoptysis.  He denies any nausea, vomiting or change in her bowel habits.  She denies any frequency urgency or hematuria.  Does not report any arthralgias or myalgias.  She denies any lymphadenopathy or petechiae.Girtha Rm. Remainder of her review of systems is negative.  Medications: I have reviewed the patient's current medications.   Current Outpatient Medications  Medication Sig Dispense Refill  . albuterol (PROVENTIL HFA;VENTOLIN HFA) 108 (90 BASE) MCG/ACT inhaler Inhale 2 puffs into the lungs every 6 (six) hours as needed. For shortness of breath    . budesonide-formoterol (SYMBICORT) 160-4.5 MCG/ACT inhaler Inhale 2 puffs into the lungs 2 (two) times daily.    . Calcium Carbonate-Vitamin D (CALTRATE 600+D) 600-400 MG-UNIT per  chew tablet Chew 1 tablet by mouth 2 (two) times daily.      . cholecalciferol (VITAMIN D) 1000 UNITS tablet Take 2,000 Units by mouth daily.      Marland Kitchen. docusate sodium (COLACE) 100 MG capsule Take 100 mg by mouth 2 (two) times daily.     Marland Kitchen. lisinopril (PRINIVIL,ZESTRIL) 5 MG tablet Take 1 tablet (5 mg total) by mouth daily. 90 tablet 1  . metFORMIN (GLUCOPHAGE) 500 MG tablet Take 1,000 mg by mouth 2 (two) times daily with a meal. Patient also takes 500 mg at lunch    . metoprolol succinate (TOPROL-XL) 25 MG 24 hr tablet Take 1 tablet (25 mg total) by mouth daily. 30 tablet 11  . rosuvastatin (CRESTOR) 20 MG tablet Take 1 tablet (20 mg total) by mouth daily. 90 tablet 3   No current facility-administered medications for this visit.      Allergies:  Allergies  Allergen Reactions  . Eggs Or Egg-Derived Products   . Penicillins     Past Medical History, Surgical history, Social history, and Family History were reviewed and updated.   Physical Exam: Blood pressure (!) 176/84, pulse 78, temperature 98.6 F (37 C), temperature source Oral, resp. rate 17, height 5\' 2"  (1.575 m), weight 224 lb 8 oz (101.8 kg), SpO2 99 %.  ECOG: 1 General appearance: Well-appearing woman without distress. Head: Atraumatic without abnormalities. Eyes: No scleral icterus. Oropharynx: Without any thrush or ulcers. Lymph nodes: No lymphadenopathy noted in the cervical, supraclavicular, or axillary nodes  Heart:regular rate and rhythm without any murmurs or gallops. Lung: Clear  in all lung fields without any wheezes or dullness to percussion. Abdomen: Soft, nontender without rebound or guarding.  No shifting dullness or ascites. Musculoskeletal: No joint deformity or effusion. Skin: No rashes or lesions.   Lab Results: Lab Results  Component Value Date   WBC 9.0 01/05/2017   HGB 15.4 01/05/2017   HCT 47.0 (H) 01/05/2017   MCV 85.5 01/05/2017   PLT 204 01/05/2017     Chemistry      Component Value  Date/Time   NA 142 10/02/2016 0856   NA 142 08/06/2014 0854   K 4.4 10/02/2016 0856   K 4.8 08/06/2014 0854   CL 99 10/02/2016 0856   CL 103 01/26/2012 1242   CO2 25 10/02/2016 0856   CO2 29 08/06/2014 0854   BUN 11 10/02/2016 0856   BUN 12.1 08/06/2014 0854   CREATININE 0.74 10/02/2016 0856   CREATININE 1.1 08/06/2014 0854      Component Value Date/Time   CALCIUM 10.2 10/02/2016 0856   CALCIUM 10.6 (H) 08/06/2014 0854   ALKPHOS 168 (H) 10/02/2016 0856   ALKPHOS 140 08/06/2014 0854   AST 14 10/02/2016 0856   AST 12 08/06/2014 0854   ALT 19 10/02/2016 0856   ALT 15 08/06/2014 0854   BILITOT 0.2 10/02/2016 0856   BILITOT 0.29 08/06/2014 0854      Results for VALARY, MANAHAN (MRN [id number]) as of 10/05/2017 10:03  Ref. Range 01/05/2017 08:41  Iron Latest Ref Range: 41 - 142 ug/dL 58  UIBC Latest Ref Range: 120 - 384 ug/dL 409  TIBC Latest Ref Range: 236 - 444 ug/dL 811  %SAT Latest Ref Range: 21 - 57 % 15 (L)  Ferritin Latest Ref Range: 9 - 269 ng/ml 24          Impression and Plan:  72 year old female with:  1. Iron deficiency anemia documented in 2012 related to poor absorption issues.  Her GI work-up did not reveal any active bleeding.  She has been receiving intermittent intravenous iron with the last treatment in March 2018.  Her hemoglobin today continues to be within normal range and iron studies are currently pending.  Risks and benefits of intravenous iron retreatment was reviewed today and she is agreeable to proceed if her iron studies are low.  He is asymptomatic at this time and would likely defer that option unless her iron studies dictate otherwise.  2. Colonoscopy screening: He is up-to-date and had a colonoscopy within the last 5 years.  3. COPD: Stable at this time without any recent exacerbations.  She remains on inhalers.  4. Diabetes mellitus: No recent exacerbations and continues to follow with her primary care provider.  5. Follow up: In a  months sooner if needed.  15  minutes was spent with the patient face-to-face today.  More than 50% of time was dedicated to patient counseling, education and coordination of her care.      Eli Hose, MD 6/7/201910:03 AM

## 2018-03-04 ENCOUNTER — Telehealth: Payer: Self-pay

## 2018-03-04 NOTE — Telephone Encounter (Signed)
Received a call from the patient requesting her upcoming appointment information. Date and time of next scheduled appointment was communicated.

## 2018-06-07 ENCOUNTER — Telehealth: Payer: Self-pay | Admitting: Oncology

## 2018-06-07 ENCOUNTER — Inpatient Hospital Stay: Payer: Medicare Other | Admitting: Oncology

## 2018-06-07 ENCOUNTER — Inpatient Hospital Stay: Payer: Medicare Other

## 2018-06-07 NOTE — Telephone Encounter (Signed)
R/s appt per 2/7 sch message - pt is aware of new appt date and time

## 2018-06-24 ENCOUNTER — Telehealth: Payer: Self-pay | Admitting: *Deleted

## 2018-06-24 NOTE — Telephone Encounter (Signed)
Patient calling to say she has fallen out of bed twice and EMS just left her home. States her legs are swollen and have dark patches on them from her diabetes. Wants to be seen by dr Clelia Croft. Instructed patient to go to urgent care of the emergency room. Her next appt with dr Clelia Croft is 07/17/2018

## 2018-06-25 ENCOUNTER — Encounter (HOSPITAL_COMMUNITY): Payer: Self-pay

## 2018-06-25 ENCOUNTER — Emergency Department (HOSPITAL_COMMUNITY): Payer: Medicare Other | Admitting: Anesthesiology

## 2018-06-25 ENCOUNTER — Other Ambulatory Visit: Payer: Self-pay

## 2018-06-25 ENCOUNTER — Emergency Department (HOSPITAL_COMMUNITY): Payer: Medicare Other

## 2018-06-25 ENCOUNTER — Encounter (HOSPITAL_COMMUNITY)
Admission: EM | Disposition: A | Discharge status: Other Acute Inpatient Hospital | Payer: Self-pay | Source: Home / Self Care | Attending: Internal Medicine

## 2018-06-25 ENCOUNTER — Inpatient Hospital Stay (HOSPITAL_COMMUNITY)
Admission: EM | Admit: 2018-06-25 | Discharge: 2018-07-08 | DRG: 853 | Disposition: A | Discharge status: Other Acute Inpatient Hospital | Payer: Medicare Other | Attending: Internal Medicine | Admitting: Internal Medicine

## 2018-06-25 DIAGNOSIS — Z6841 Body Mass Index (BMI) 40.0 and over, adult: Secondary | ICD-10-CM

## 2018-06-25 DIAGNOSIS — N7682 Fournier disease of vagina and vulva: Secondary | ICD-10-CM | POA: Diagnosis present

## 2018-06-25 DIAGNOSIS — I2510 Atherosclerotic heart disease of native coronary artery without angina pectoris: Secondary | ICD-10-CM | POA: Diagnosis present

## 2018-06-25 DIAGNOSIS — M726 Necrotizing fasciitis: Secondary | ICD-10-CM | POA: Diagnosis present

## 2018-06-25 DIAGNOSIS — M729 Fibroblastic disorder, unspecified: Secondary | ICD-10-CM | POA: Diagnosis not present

## 2018-06-25 DIAGNOSIS — Z8249 Family history of ischemic heart disease and other diseases of the circulatory system: Secondary | ICD-10-CM

## 2018-06-25 DIAGNOSIS — G2581 Restless legs syndrome: Secondary | ICD-10-CM | POA: Diagnosis present

## 2018-06-25 DIAGNOSIS — B9620 Unspecified Escherichia coli [E. coli] as the cause of diseases classified elsewhere: Secondary | ICD-10-CM | POA: Diagnosis not present

## 2018-06-25 DIAGNOSIS — R339 Retention of urine, unspecified: Secondary | ICD-10-CM | POA: Diagnosis not present

## 2018-06-25 DIAGNOSIS — Z91012 Allergy to eggs: Secondary | ICD-10-CM | POA: Diagnosis not present

## 2018-06-25 DIAGNOSIS — J449 Chronic obstructive pulmonary disease, unspecified: Secondary | ICD-10-CM | POA: Diagnosis present

## 2018-06-25 DIAGNOSIS — I82401 Acute embolism and thrombosis of unspecified deep veins of right lower extremity: Secondary | ICD-10-CM | POA: Diagnosis not present

## 2018-06-25 DIAGNOSIS — I252 Old myocardial infarction: Secondary | ICD-10-CM | POA: Diagnosis not present

## 2018-06-25 DIAGNOSIS — E1165 Type 2 diabetes mellitus with hyperglycemia: Secondary | ICD-10-CM | POA: Diagnosis present

## 2018-06-25 DIAGNOSIS — N7689 Other specified inflammation of vagina and vulva: Secondary | ICD-10-CM | POA: Diagnosis present

## 2018-06-25 DIAGNOSIS — I82621 Acute embolism and thrombosis of deep veins of right upper extremity: Secondary | ICD-10-CM | POA: Diagnosis not present

## 2018-06-25 DIAGNOSIS — A419 Sepsis, unspecified organism: Secondary | ICD-10-CM | POA: Diagnosis present

## 2018-06-25 DIAGNOSIS — L0231 Cutaneous abscess of buttock: Secondary | ICD-10-CM | POA: Diagnosis present

## 2018-06-25 DIAGNOSIS — F17210 Nicotine dependence, cigarettes, uncomplicated: Secondary | ICD-10-CM | POA: Diagnosis present

## 2018-06-25 DIAGNOSIS — Z794 Long term (current) use of insulin: Secondary | ICD-10-CM | POA: Diagnosis not present

## 2018-06-25 DIAGNOSIS — G4733 Obstructive sleep apnea (adult) (pediatric): Secondary | ICD-10-CM | POA: Diagnosis present

## 2018-06-25 DIAGNOSIS — E118 Type 2 diabetes mellitus with unspecified complications: Secondary | ICD-10-CM | POA: Diagnosis not present

## 2018-06-25 DIAGNOSIS — I471 Supraventricular tachycardia: Secondary | ICD-10-CM | POA: Diagnosis not present

## 2018-06-25 DIAGNOSIS — Z9049 Acquired absence of other specified parts of digestive tract: Secondary | ICD-10-CM

## 2018-06-25 DIAGNOSIS — Z7951 Long term (current) use of inhaled steroids: Secondary | ICD-10-CM

## 2018-06-25 DIAGNOSIS — D649 Anemia, unspecified: Secondary | ICD-10-CM | POA: Diagnosis present

## 2018-06-25 DIAGNOSIS — E0865 Diabetes mellitus due to underlying condition with hyperglycemia: Secondary | ICD-10-CM | POA: Diagnosis not present

## 2018-06-25 DIAGNOSIS — Z452 Encounter for adjustment and management of vascular access device: Secondary | ICD-10-CM

## 2018-06-25 DIAGNOSIS — Z90710 Acquired absence of both cervix and uterus: Secondary | ICD-10-CM

## 2018-06-25 DIAGNOSIS — E11628 Type 2 diabetes mellitus with other skin complications: Secondary | ICD-10-CM | POA: Diagnosis present

## 2018-06-25 DIAGNOSIS — I25119 Atherosclerotic heart disease of native coronary artery with unspecified angina pectoris: Secondary | ICD-10-CM | POA: Diagnosis not present

## 2018-06-25 DIAGNOSIS — E1152 Type 2 diabetes mellitus with diabetic peripheral angiopathy with gangrene: Secondary | ICD-10-CM | POA: Diagnosis not present

## 2018-06-25 DIAGNOSIS — I10 Essential (primary) hypertension: Secondary | ICD-10-CM | POA: Diagnosis not present

## 2018-06-25 DIAGNOSIS — K611 Rectal abscess: Secondary | ICD-10-CM | POA: Diagnosis present

## 2018-06-25 DIAGNOSIS — E785 Hyperlipidemia, unspecified: Secondary | ICD-10-CM | POA: Diagnosis present

## 2018-06-25 DIAGNOSIS — L89892 Pressure ulcer of other site, stage 2: Secondary | ICD-10-CM | POA: Diagnosis present

## 2018-06-25 DIAGNOSIS — Z9114 Patient's other noncompliance with medication regimen: Secondary | ICD-10-CM

## 2018-06-25 DIAGNOSIS — Z79899 Other long term (current) drug therapy: Secondary | ICD-10-CM

## 2018-06-25 DIAGNOSIS — L89152 Pressure ulcer of sacral region, stage 2: Secondary | ICD-10-CM | POA: Diagnosis present

## 2018-06-25 DIAGNOSIS — I82409 Acute embolism and thrombosis of unspecified deep veins of unspecified lower extremity: Secondary | ICD-10-CM | POA: Diagnosis not present

## 2018-06-25 DIAGNOSIS — Z880 Allergy status to penicillin: Secondary | ICD-10-CM | POA: Diagnosis not present

## 2018-06-25 DIAGNOSIS — M7989 Other specified soft tissue disorders: Secondary | ICD-10-CM | POA: Diagnosis not present

## 2018-06-25 DIAGNOSIS — A480 Gas gangrene: Secondary | ICD-10-CM | POA: Diagnosis not present

## 2018-06-25 DIAGNOSIS — Z9119 Patient's noncompliance with other medical treatment and regimen: Secondary | ICD-10-CM

## 2018-06-25 DIAGNOSIS — W050XXA Fall from non-moving wheelchair, initial encounter: Secondary | ICD-10-CM | POA: Diagnosis present

## 2018-06-25 DIAGNOSIS — E13628 Other specified diabetes mellitus with other skin complications: Secondary | ICD-10-CM | POA: Diagnosis not present

## 2018-06-25 DIAGNOSIS — L8990 Pressure ulcer of unspecified site, unspecified stage: Secondary | ICD-10-CM | POA: Diagnosis present

## 2018-06-25 DIAGNOSIS — B9689 Other specified bacterial agents as the cause of diseases classified elsewhere: Secondary | ICD-10-CM | POA: Diagnosis not present

## 2018-06-25 DIAGNOSIS — Z9981 Dependence on supplemental oxygen: Secondary | ICD-10-CM

## 2018-06-25 DIAGNOSIS — Z1623 Resistance to quinolones and fluoroquinolones: Secondary | ICD-10-CM | POA: Diagnosis present

## 2018-06-25 HISTORY — PX: INCISION AND DRAINAGE PERIRECTAL ABSCESS: SHX1804

## 2018-06-25 LAB — BASIC METABOLIC PANEL
ANION GAP: 17 — AB (ref 5–15)
BUN: 25 mg/dL — ABNORMAL HIGH (ref 8–23)
CO2: 25 mmol/L (ref 22–32)
Calcium: 9.1 mg/dL (ref 8.9–10.3)
Chloride: 93 mmol/L — ABNORMAL LOW (ref 98–111)
Creatinine, Ser: 1.05 mg/dL — ABNORMAL HIGH (ref 0.44–1.00)
GFR calc non Af Amer: 52 mL/min — ABNORMAL LOW (ref >60)
Glucose, Bld: 257 mg/dL — ABNORMAL HIGH (ref 70–99)
POTASSIUM: 2.8 mmol/L — AB (ref 3.5–5.1)
Sodium: 135 mmol/L (ref 135–145)

## 2018-06-25 LAB — CBC WITH DIFFERENTIAL/PLATELET
ABS IMMATURE GRANULOCYTES: 1.67 10*3/uL — AB (ref 0.00–0.07)
Abs Immature Granulocytes: 0 10*3/uL (ref 0.00–0.07)
Band Neutrophils: 8 %
Basophils Absolute: 0 10*3/uL (ref 0.0–0.1)
Basophils Absolute: 0 10*3/uL (ref 0.0–0.1)
Basophils Relative: 0 %
Basophils Relative: 0 %
EOS ABS: 0 10*3/uL (ref 0.0–0.5)
Eosinophils Absolute: 0 10*3/uL (ref 0.0–0.5)
Eosinophils Relative: 0 %
Eosinophils Relative: 0 %
HCT: 46 % (ref 36.0–46.0)
HEMATOCRIT: 39 % (ref 36.0–46.0)
Hemoglobin: 13.5 g/dL (ref 12.0–15.0)
Hemoglobin: 11.7 g/dL — ABNORMAL LOW (ref 12.0–15.0)
Immature Granulocytes: 4 %
LYMPHS ABS: 1.7 10*3/uL (ref 0.7–4.0)
Lymphocytes Relative: 4 %
Lymphocytes Relative: 4 %
Lymphs Abs: 1.7 10*3/uL (ref 0.7–4.0)
MCH: 24.6 pg — ABNORMAL LOW (ref 26.0–34.0)
MCH: 24.8 pg — ABNORMAL LOW (ref 26.0–34.0)
MCHC: 29.3 g/dL — ABNORMAL LOW (ref 30.0–36.0)
MCHC: 30 g/dL (ref 30.0–36.0)
MCV: 83.9 fL (ref 80.0–100.0)
MCV: 82.8 fL (ref 80.0–100.0)
Monocytes Absolute: 1.6 10*3/uL — ABNORMAL HIGH (ref 0.1–1.0)
Monocytes Absolute: 0.4 10*3/uL (ref 0.1–1.0)
Monocytes Relative: 4 %
Monocytes Relative: 1 %
NEUTROS PCT: 88 %
Neutro Abs: 34.2 10*3/uL — ABNORMAL HIGH (ref 1.7–7.7)
Neutro Abs: 40.2 10*3/uL — ABNORMAL HIGH (ref 1.7–7.7)
Neutrophils Relative %: 87 %
Platelets: 365 10*3/uL (ref 150–400)
Platelets: 217 10*3/uL (ref 150–400)
RBC: 5.48 MIL/uL — AB (ref 3.87–5.11)
RBC: 4.71 MIL/uL (ref 3.87–5.11)
RDW: 14.2 % (ref 11.5–15.5)
RDW: 14.3 % (ref 11.5–15.5)
WBC: 39.2 10*3/uL — ABNORMAL HIGH (ref 4.0–10.5)
WBC: 42.3 10*3/uL — ABNORMAL HIGH (ref 4.0–10.5)
nRBC: 0 % (ref 0.0–0.2)
nRBC: 0 % (ref 0.0–0.2)

## 2018-06-25 LAB — PROCALCITONIN: Procalcitonin: 8.03 ng/mL

## 2018-06-25 LAB — COMPREHENSIVE METABOLIC PANEL
ALT: 50 U/L — ABNORMAL HIGH (ref 0–44)
ANION GAP: 10 (ref 5–15)
AST: 44 U/L — ABNORMAL HIGH (ref 15–41)
Albumin: 1.9 g/dL — ABNORMAL LOW (ref 3.5–5.0)
Alkaline Phosphatase: 147 U/L — ABNORMAL HIGH (ref 38–126)
BUN: 23 mg/dL (ref 8–23)
CO2: 22 mmol/L (ref 22–32)
Calcium: 7.5 mg/dL — ABNORMAL LOW (ref 8.9–10.3)
Chloride: 105 mmol/L (ref 98–111)
Creatinine, Ser: 0.59 mg/dL (ref 0.44–1.00)
GFR calc non Af Amer: >60 mL/min (ref >60)
Glucose, Bld: 71 mg/dL (ref 70–99)
Potassium: 2.5 mmol/L — CL (ref 3.5–5.1)
Sodium: 137 mmol/L (ref 135–145)
Total Bilirubin: 0.4 mg/dL (ref 0.3–1.2)
Total Protein: 5.3 g/dL — ABNORMAL LOW (ref 6.5–8.1)

## 2018-06-25 LAB — GLUCOSE, CAPILLARY
GLUCOSE-CAPILLARY: 259 mg/dL — AB (ref 70–99)
Glucose-Capillary: 242 mg/dL — ABNORMAL HIGH (ref 70–99)
Glucose-Capillary: 205 mg/dL — ABNORMAL HIGH (ref 70–99)
Glucose-Capillary: 248 mg/dL — ABNORMAL HIGH (ref 70–99)

## 2018-06-25 LAB — PROTIME-INR
INR: 1.2 (ref 0.8–1.2)
INR: 1.2 (ref 0.8–1.2)
Prothrombin Time: 15.4 seconds — ABNORMAL HIGH (ref 11.4–15.2)
Prothrombin Time: 14.7 seconds (ref 11.4–15.2)

## 2018-06-25 LAB — LACTIC ACID, PLASMA
Lactic Acid, Venous: 5 mmol/L (ref 0.5–1.9)
Lactic Acid, Venous: 3.2 mmol/L (ref 0.5–1.9)
Lactic Acid, Venous: 2.7 mmol/L (ref 0.5–1.9)

## 2018-06-25 LAB — HEMOGLOBIN A1C
Hgb A1c MFr Bld: 11.9 % — ABNORMAL HIGH (ref 4.8–5.6)
Mean Plasma Glucose: 294.83 mg/dL

## 2018-06-25 LAB — TROPONIN I: Troponin I: 0.03 ng/mL (ref <0.03)

## 2018-06-25 LAB — TYPE AND SCREEN
ABO/RH(D): A POS
Antibody Screen: NEGATIVE

## 2018-06-25 LAB — APTT: aPTT: 32 seconds (ref 24–36)

## 2018-06-25 LAB — MRSA PCR SCREENING: MRSA by PCR: NEGATIVE

## 2018-06-25 SURGERY — INCISION AND DRAINAGE, ABSCESS, PERIRECTAL
Anesthesia: General | Site: Groin

## 2018-06-25 MED ORDER — SODIUM CHLORIDE 0.9 % IV BOLUS (SEPSIS)
500.0000 mL | Freq: Once | INTRAVENOUS | Status: AC
  Administered 2018-06-25: 500 mL via INTRAVENOUS

## 2018-06-25 MED ORDER — SODIUM CHLORIDE 0.9 % IV SOLN
INTRAVENOUS | Status: DC | PRN
  Administered 2018-06-25: 17:00:00 via INTRAVENOUS

## 2018-06-25 MED ORDER — SODIUM CHLORIDE (PF) 0.9 % IJ SOLN
INTRAMUSCULAR | Status: AC
  Filled 2018-06-25: qty 50

## 2018-06-25 MED ORDER — LIDOCAINE 2% (20 MG/ML) 5 ML SYRINGE
INTRAMUSCULAR | Status: DC | PRN
  Administered 2018-06-25: 60 mg via INTRAVENOUS

## 2018-06-25 MED ORDER — SODIUM CHLORIDE 0.9 % IV SOLN
2.0000 g | INTRAVENOUS | Status: DC
  Administered 2018-06-25 – 2018-07-08 (×14): 2 g via INTRAVENOUS
  Filled 2018-06-25: qty 20
  Filled 2018-06-25 (×4): qty 2
  Filled 2018-06-25: qty 20
  Filled 2018-06-25 (×2): qty 2
  Filled 2018-06-25: qty 20
  Filled 2018-06-25 (×6): qty 2

## 2018-06-25 MED ORDER — FENTANYL CITRATE (PF) 250 MCG/5ML IJ SOLN
INTRAMUSCULAR | Status: AC
  Filled 2018-06-25: qty 5

## 2018-06-25 MED ORDER — INSULIN ASPART 100 UNIT/ML ~~LOC~~ SOLN
14.0000 [IU] | Freq: Once | SUBCUTANEOUS | Status: AC
  Administered 2018-06-25: 14 [IU] via SUBCUTANEOUS

## 2018-06-25 MED ORDER — PROPOFOL 10 MG/ML IV BOLUS
INTRAVENOUS | Status: AC
  Filled 2018-06-25: qty 20

## 2018-06-25 MED ORDER — VANCOMYCIN HCL IN DEXTROSE 1-5 GM/200ML-% IV SOLN
1000.0000 mg | Freq: Once | INTRAVENOUS | Status: AC
  Administered 2018-06-25: 1000 mg via INTRAVENOUS
  Filled 2018-06-25: qty 200

## 2018-06-25 MED ORDER — IOPAMIDOL (ISOVUE-300) INJECTION 61%
INTRAVENOUS | Status: AC
  Filled 2018-06-25: qty 100

## 2018-06-25 MED ORDER — FENTANYL CITRATE (PF) 250 MCG/5ML IJ SOLN
INTRAMUSCULAR | Status: DC | PRN
  Administered 2018-06-25: 50 ug via INTRAVENOUS
  Administered 2018-06-25: 100 ug via INTRAVENOUS

## 2018-06-25 MED ORDER — LIDOCAINE 2% (20 MG/ML) 5 ML SYRINGE
INTRAMUSCULAR | Status: AC
  Filled 2018-06-25: qty 5

## 2018-06-25 MED ORDER — ROCURONIUM BROMIDE 100 MG/10ML IV SOLN
INTRAVENOUS | Status: AC
  Filled 2018-06-25: qty 1

## 2018-06-25 MED ORDER — POTASSIUM CHLORIDE 10 MEQ/100ML IV SOLN
10.0000 meq | INTRAVENOUS | Status: AC
  Administered 2018-06-25 – 2018-06-26 (×3): 10 meq via INTRAVENOUS
  Filled 2018-06-25 (×5): qty 100

## 2018-06-25 MED ORDER — SODIUM CHLORIDE 0.9 % IV SOLN
INTRAVENOUS | Status: DC
  Administered 2018-06-25 – 2018-06-26 (×2): via INTRAVENOUS
  Administered 2018-06-28: 100 mL/h via INTRAVENOUS
  Administered 2018-06-29: 04:00:00 via INTRAVENOUS

## 2018-06-25 MED ORDER — ETOMIDATE 2 MG/ML IV SOLN
INTRAVENOUS | Status: DC | PRN
  Administered 2018-06-25: 16 mg via INTRAVENOUS

## 2018-06-25 MED ORDER — CLINDAMYCIN PHOSPHATE 600 MG/50ML IV SOLN
600.0000 mg | Freq: Once | INTRAVENOUS | Status: AC
  Administered 2018-06-25: 900 mg via INTRAVENOUS
  Filled 2018-06-25 (×2): qty 50

## 2018-06-25 MED ORDER — SUCCINYLCHOLINE CHLORIDE 200 MG/10ML IV SOSY
PREFILLED_SYRINGE | INTRAVENOUS | Status: DC | PRN
  Administered 2018-06-25: 80 mg via INTRAVENOUS

## 2018-06-25 MED ORDER — POTASSIUM CHLORIDE 2 MEQ/ML IV SOLN
INTRAVENOUS | Status: DC
  Administered 2018-06-25 – 2018-07-01 (×12): via INTRAVENOUS
  Filled 2018-06-25 (×22): qty 1000

## 2018-06-25 MED ORDER — INSULIN ASPART 100 UNIT/ML ~~LOC~~ SOLN
SUBCUTANEOUS | Status: AC
  Filled 2018-06-25: qty 1

## 2018-06-25 MED ORDER — MEPERIDINE HCL 50 MG/ML IJ SOLN: 6.2500 mg | INTRAMUSCULAR | Status: DC | PRN

## 2018-06-25 MED ORDER — ONDANSETRON HCL 4 MG/2ML IJ SOLN
INTRAMUSCULAR | Status: DC | PRN
  Administered 2018-06-25: 4 mg via INTRAVENOUS

## 2018-06-25 MED ORDER — SODIUM CHLORIDE 0.9 % IV BOLUS (SEPSIS)
1000.0000 mL | Freq: Once | INTRAVENOUS | Status: AC
  Administered 2018-06-25: 1000 mL via INTRAVENOUS

## 2018-06-25 MED ORDER — 0.9 % SODIUM CHLORIDE (POUR BTL) OPTIME
TOPICAL | Status: DC | PRN
  Administered 2018-06-25: 1000 mL

## 2018-06-25 MED ORDER — ONDANSETRON HCL 4 MG/2ML IJ SOLN
INTRAMUSCULAR | Status: AC
  Filled 2018-06-25: qty 2

## 2018-06-25 MED ORDER — IOPAMIDOL (ISOVUE-300) INJECTION 61%
100.0000 mL | Freq: Once | INTRAVENOUS | Status: AC | PRN
  Administered 2018-06-25: 100 mL via INTRAVENOUS

## 2018-06-25 MED ORDER — INSULIN ASPART 100 UNIT/ML ~~LOC~~ SOLN
10.0000 [IU] | Freq: Once | SUBCUTANEOUS | Status: AC
  Administered 2018-06-25: 10 [IU] via INTRAVENOUS

## 2018-06-25 MED ORDER — HYDROMORPHONE HCL 1 MG/ML IJ SOLN: 0.2500 mg | INTRAMUSCULAR | Status: DC | PRN

## 2018-06-25 MED ORDER — PROMETHAZINE HCL 25 MG/ML IJ SOLN: 6.2500 mg | INTRAMUSCULAR | Status: DC | PRN

## 2018-06-25 SURGICAL SUPPLY — 22 items
BLADE SURG 15 STRL LF DISP TIS (BLADE) ×1 IMPLANT
BLADE SURG 15 STRL SS (BLADE) ×2
BNDG GAUZE ELAST 4 BULKY (GAUZE/BANDAGES/DRESSINGS) ×2 IMPLANT
COVER WAND RF STERILE (DRAPES) IMPLANT
DRAIN PENROSE 18X1/4 LTX STRL (WOUND CARE) IMPLANT
DRSG PAD ABDOMINAL 8X10 ST (GAUZE/BANDAGES/DRESSINGS) ×2 IMPLANT
ELECT PENCIL ROCKER SW 15FT (MISCELLANEOUS) ×3 IMPLANT
GAUZE 4X4 16PLY RFD (DISPOSABLE) ×3 IMPLANT
GAUZE SPONGE 4X4 12PLY STRL (GAUZE/BANDAGES/DRESSINGS) IMPLANT
GLOVE BIO SURGEON STRL SZ 6.5 (GLOVE) ×2 IMPLANT
GLOVE BIO SURGEONS STRL SZ 6.5 (GLOVE) ×1
GLOVE BIOGEL PI IND STRL 7.0 (GLOVE) ×1 IMPLANT
GLOVE BIOGEL PI INDICATOR 7.0 (GLOVE) ×2
GOWN STRL REUS W/TWL 2XL LVL3 (GOWN DISPOSABLE) ×3 IMPLANT
GOWN STRL REUS W/TWL XL LVL3 (GOWN DISPOSABLE) ×3 IMPLANT
PACK LITHOTOMY IV (CUSTOM PROCEDURE TRAY) ×3 IMPLANT
SURGILUBE 2OZ TUBE FLIPTOP (MISCELLANEOUS) ×3 IMPLANT
SUT SILK 2 0 (SUTURE)
SUT SILK 2-0 18XBRD TIE 12 (SUTURE) IMPLANT
TOWEL OR 17X26 10 PK STRL BLUE (TOWEL DISPOSABLE) ×3 IMPLANT
TOWEL OR NON WOVEN STRL DISP B (DISPOSABLE) ×3 IMPLANT
YANKAUER SUCT BULB TIP 10FT TU (MISCELLANEOUS) ×3 IMPLANT

## 2018-06-25 NOTE — H&P (Signed)
[facility] History and Physical  GRACIOUS RENKEN JXB:[id number] DOB: January 30, 1946 DOA: 06/25/2018  Referring physician: Dr. Romie Levee  PCP: Jordan Hawks, PA-C   Chief Complaint:   HPI: Courtney [name] is a 73 y.o. BF PMHx Anemia,Arthritis, Asthma, COPD, OSA on CPAP, CAD, diabetes uncontrolled, HLD,, MI, Restless leg syndrome, noncompliance.  States is supposed to be on 2 L O2 at home but is not using.  On CPAP at home is not using.  Unknown settings for CPAP.  Patient's last hemoglobin A1c 2012, noncompliant.  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  HEENT:  No headaches, Difficulty swallowing,Tooth/dental problems,Sore throat,  No sneezing, itching, ear ache, nasal congestion, post nasal drip,  Cardio-vascular:  No chest pain, Orthopnea, PND, swelling in lower extremities, anasarca, dizziness, palpitations  GI:  No heartburn, indigestion, abdominal pain, nausea, vomiting, diarrhea, change in bowel habits, loss of appetite  Resp:  No shortness of breath with exertion or at rest. No excess mucus, no productive cough, No non-productive cough, No coughing up of blood.No change in color of mucus.No wheezing.No chest wall deformity  Skin:  Foul-smelling right lower leg/perianal fasciitis: Positive discharge from surgical site.  GU:  no dysuria, change in color of urine, no urgency or frequency. No flank pain.  Musculoskeletal:  No joint pain or swelling. No decreased range of motion. No back pain.  Psych:  No change in mood or affect. No depression or anxiety. No memory loss.   Past Medical History:  Diagnosis Date  . Anemia   . Arthritis   . Asthma   . CAD (coronary artery disease)   . COPD (chronic obstructive pulmonary disease) (HCC)   . Diabetes mellitus   . Hyperlipidemia   . Hypertension   . Myocardial infarct (HCC)   . Postmenopausal atrophic vaginitis   .  Restless leg syndrome    Past Surgical History:  Procedure Laterality Date  . CHOLECYSTECTOMY  1970's  . OTHER SURGICAL HISTORY  1970's   hysterectomy   Social History:  reports that she has been smoking. She has been smoking about 0.50 packs per day. She has never used smokeless tobacco. She reports that she does not drink alcohol or use drugs.  Allergies  Allergen Reactions  . Eggs Or Egg-Derived Products   . Penicillins     Did it involve swelling of the face/tongue/throat, SOB, or low BP? No Did it involve sudden or severe rash/hives, skin peeling, or any reaction on the inside of your mouth or nose? Yes Did you need to seek medical attention at a hospital or doctor's office? yes When did it last happen?more than 10 years If all above answers are "NO", may proceed with cephalosporin use.     Family History  Problem Relation Age of Onset  . Hypertension Brother   . Heart attack Neg Hx     Prior to Admission medications   Medication Sig Start Date End Date Taking? Authorizing Provider  LANTUS SOLOSTAR 100 UNIT/ML Solostar Pen Inject 25 Units into the skin daily. 04/16/18  Yes [provider]  lisinopril (PRINIVIL,ZESTRIL) 5 MG tablet Take 1 tablet (5 mg total) by mouth daily. Patient not taking: Reported on 06/25/2018 09/04/16 12/03/16  Corky Crafts, MD  metoprolol succinate (TOPROL-XL) 25 MG 24 hr tablet Take 1 tablet (25 mg total) by mouth daily. Patient not taking: Reported on 06/25/2018 10/02/16  Corky Crafts, MD  rosuvastatin (CRESTOR) 20 MG tablet Take 1 tablet (20 mg total) by mouth daily. Patient not taking: Reported on 06/25/2018 09/05/16 12/04/16  Corky Crafts, MD     Consultants:  Surgery Dr. Romie Levee    Procedures/Significant Events:  2/25 [place] Gangrene INCISION AND DEBRIDEMENT PERIRECTAL ABCESS    I have personally reviewed and interpreted all radiology studies and my findings are as above.   VENTILATOR  SETTINGS: None   Cultures Pending     Antimicrobials: Anti-infectives (From admission, onward)   Start     Dose/Rate Stop   06/26/18 0600  clindamycin (CLEOCIN) IVPB 300 mg     300 mg 100 mL/hr over 30 Minutes     06/26/18 0400  cefTRIAXone (ROCEPHIN) 2 g in sodium chloride 0.9 % 100 mL IVPB     2 g 200 mL/hr over 30 Minutes     06/25/18 2330  vancomycin (VANCOCIN) IVPB 1000 mg/200 mL premix     1,000 mg 200 mL/hr over 60 Minutes 06/26/18 0015   06/25/18 1500  clindamycin (CLEOCIN) IVPB 600 mg     600 mg 100 mL/hr over 30 Minutes 06/25/18 1657   06/25/18 1330  vancomycin (VANCOCIN) IVPB 1000 mg/200 mL premix     1,000 mg 200 mL/hr over 60 Minutes 06/25/18 1606   06/25/18 1330  cefTRIAXone (ROCEPHIN) 2 g in sodium chloride 0.9 % 100 mL IVPB     2 g 200 mL/hr over 30 Minutes         Devices None     LINES / TUBES:      Continuous Infusions: . sodium chloride 75 mL/hr at 06/25/18 2200  . cefTRIAXone (ROCEPHIN)  IV Stopped (06/25/18 1615)  . cefTRIAXone (ROCEPHIN)  IV    . clindamycin (CLEOCIN) IV    . lactated ringers 1,000 mL with potassium chloride 20 mEq infusion 100 mL/hr at 06/25/18 2200    Physical Exam: Vitals:   06/25/18 2034 06/25/18 2200 06/25/18 2300 06/26/18 0000  BP:  139/87 (!) 157/77   Pulse:      Resp:  (!) 29 (!) 24   Temp:    97.7 F (36.5 C)  TempSrc:    Oral  SpO2:      Weight: 92.8 kg       Wt Readings from Last 3 Encounters:  06/25/18 92.8 kg  10/05/17 101.8 kg  01/05/17 99.6 kg    General: A/O x 4 no acute respiratory distress, sleepy but patient is post surgical. Eyes: negative scleral hemorrhage, negative anisocoria, negative icterus ENT: Negative Runny nose, negative gingival bleeding, Neck:  Negative scars, masses, torticollis, lymphadenopathy, JVD Lungs: Clear to auscultation bilaterally without wheezes or crackles Cardiovascular: Regular rate and rhythm without murmur gallop or rub normal S1 and S2 Abdomen:  negative abdominal pain, nondistended, positive soft, bowel sounds, no rebound, no ascites, no appreciable mass Extremities: Foul-smelling discharge from wound right lower buttocks/perineal area  Skin: Negative rashes, lesions, ulcers Psychiatric:  Negative depression, negative anxiety, negative fatigue, negative mania  Central nervous system:  Cranial nerves II through XII intact, tongue/uvula midline, all extremities muscle strength 5/5, sensation intact throughout, negative dysarthria, negative expressive aphasia, negative receptive aphasia.        Labs on Admission:  Basic Metabolic Panel: Recent Labs  Lab 06/25/18 1209 06/25/18 1947  NA 135 137  K 2.8* 2.5*  CL 93* 105  CO2 25 22  GLUCOSE 257* 71  BUN 25* 23  CREATININE 1.05* 0.59  CALCIUM 9.1 7.5*   Liver Function Tests: Recent Labs  Lab 06/25/18 1947  AST 44*  ALT 50*  ALKPHOS 147*  BILITOT 0.4  PROT 5.3*  ALBUMIN 1.9*   No results for input(s): LIPASE, AMYLASE in the last 168 hours. No results for input(s): AMMONIA in the last 168 hours. CBC: Recent Labs  Lab 06/25/18 1209 06/25/18 2230  WBC 39.2* 42.3*  NEUTROABS 34.2* 40.2*  HGB 13.5 11.7*  HCT 46.0 39.0  MCV 83.9 82.8  PLT 365 217   Cardiac Enzymes: Recent Labs  Lab 06/25/18 1209  TROPONINI 0.03*    BNP (last 3 results) No results for input(s): BNP in the last 8760 hours.  ProBNP (last 3 results) No results for input(s): PROBNP in the last 8760 hours.  CBG: Recent Labs  Lab 06/25/18 1624 06/25/18 1805 06/25/18 1842 06/25/18 1917  GLUCAP 242* 259* 248* 205*    Radiological Exams on Admission: X-ray Chest Pa Or Ap  Result Date: 06/25/2018 CLINICAL DATA:  Central line placement EXAM: CHEST  1 VIEW COMPARISON:  02/11/2010 FINDINGS: Right internal jugular central line is been placed with the tip in the lower SVC. No pneumothorax. Heart is mildly enlarged with perihilar and lower lobe opacities and diffuse interstitial prominence, likely  mild edema/CHF. No effusions or acute bony abnormality. IMPRESSION: Right central line tip in the SVC.  No pneumothorax. Mild edema/CHF. Electronically Signed   By: Charlett Nose M.D.   On: 06/25/2018 18:38   Ct Abdomen Pelvis W Contrast  Result Date: 06/25/2018 CLINICAL DATA:  73 year old female with ball on buttock noticed yesterday. Initial encounter. EXAM: CT ABDOMEN AND PELVIS WITH CONTRAST TECHNIQUE: Multidetector CT imaging of the abdomen and pelvis was performed using the standard protocol following bolus administration of intravenous contrast. CONTRAST:  ISOVUE-300 IOPAMIDOL (ISOVUE-300) INJECTION 61% COMPARISON:  None. FINDINGS: Lower chest: Minimal basilar atelectasis. Mild cardiomegaly. Coronary artery calcifications. Hepatobiliary: No worrisome hepatic lesion. Suspect fatty infiltration. Post cholecystectomy. Pancreas: No worrisome pancreatic mass or inflammation. Spleen: Subcentimeter area of enhancement dome of spleen of indeterminate etiology or significance. Spleen not enlarged. Adrenals/Urinary Tract: No obstructing stone or hydronephrosis. Several renal low-density structures too small to characterize although statistically likely cysts. Hyperplasia adrenal glands. Prominent size urinary bladder. Noncontrast filled imaging without acute abnormality. Stomach/Bowel: Besides necrotizing fasciitis impressing upon the right lateral aspect of the rectum, no primary bowel inflammatory process is noted. Appendix not clearly delineated. Portions of the stomach, small bowel and colon are under distended limiting evaluation. Vascular/Lymphatic: Atherosclerotic changes aorta and aortic branch vessels. No abdominal aortic aneurysm or large vessel occlusion. Inguinal adenopathy greater on right. Slightly prominent size right external iliac lymph node. Reproductive: Post hysterectomy.  No worrisome adnexal mass. 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 Soft tissue  gas and inflammatory process extends from the upper right thigh/right buttock region anteriorly and superiorly impressing upon the right lateral aspect of the rectum and lower cervix. Anterior extension into the mons region greater on the right but also extending into the left mons. Gas and inflammatory soft tissue process than extends anterior to the pubic symphysis and anterior to the right femoral region (immediately adjacent to femoral vessels) and along the anterior and lateral aspect of the right pelvic and right lower abdominal wall. The rectum is immediately adjacent to this inflammatory process and displaced slightly to the left. No clear communication with the rectum identified. Lateral to the upper right femur is a 1.3 cm subcutaneous lesion possibly a sebaceous cyst. If  no free intraperitoneal air or drainable fluid collection. No bowel containing hernia. Small fat containing hernia periumbilical region. Musculoskeletal: Degenerative changes lower thoracic and lumbar spine most notable L4-5 and L5-S1 level. Mild hip joint degenerative changes and sacroiliac joint degenerative changes. IMPRESSION: 1. 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 Soft tissue gas and inflammatory process extends from the upper right thigh/right buttock region anteriorly and superiorly impressing upon the right lateral aspect of the rectum and lower cervix. Anterior extension into the mons region greater on the right but also extending into the left mons. Gas and inflammatory soft tissue process than extends anterior to the pubic symphysis and anterior to the right femoral region (immediately adjacent to femoral vessels) and along the anterior and lateral aspect of the right pelvic and right lower abdominal wall. 2. The rectum is immediately adjacent to this inflammatory process and displaced slightly to the left. No clear communication with the rectum identified. 3. Inguinal adenopathy greater on  the right. 4. Lateral to the upper right femur is a 1.3 cm subcutaneous lesion possibly a sebaceous cyst. 5. Subcentimeter hyperdensity dome of the spleen of indeterminate etiology/significance. 6. Scattered small low-density renal lesions too small to characterize statistically likely cysts. 7. Suspect fatty infiltration liver. 8. Post cholecystectomy. 9. Appendix not visualized. 10.  Aortic Atherosclerosis (ICD10-I70.0). 11. Mild cardiomegaly with coronary artery calcifications. 12. Degenerative changes lower thoracic and lumbar spine. These results were called by telephone at the time of interpretation on 06/25/2018 at 3:22 pm to Dr. Meridee Score , who verbally acknowledged these results. Electronically Signed   By: Lacy Duverney M.D.   On: 06/25/2018 15:51    EKG: Independently reviewed  Assessment/Plan Active Problems:   HTN (hypertension)   HLD (hyperlipidemia)   Restless leg syndrome   COPD (chronic obstructive pulmonary disease) (HCC)   CAD (coronary artery disease)   Fasciitis   OSA and COPD overlap syndrome (HCC)   Fasciitis - Patient scheduled for additional debridement 2/26? - Continue current antibiotics, will allow surgery to wean -Patient counseled that if her diabetes is not brought under control the likelihood of healing will decrease.  COPD (chronic obstructive pulmonary disease) - Titrate O2 to maintain SPO2 89 to 93%.  Patient's home O2 regimen 2 L via [place]  OSA and COPD overlap syndrome  -CPAP per respiratory  HTN  -Metoprolol XL 25 mg daily - We will hold lisinopril when patient's upcoming surgery.  May have to add additional agent.  CAD -See HTN  Diabetes type 2 uncontrolled - Lantus 25 mg daily -2/25 hemoglobin A1c= 11.9 - Add additional insulin coverage as required, patient currently n.p.o.    HLD  -Lipid panel pending - Restart Crestor 20 mg daily    Restless leg syndrome              Code Status: Full (DVT Prophylaxis: SCD \ family  Communication: None Disposition Plan: Per surgery  Data Reviewed: Care during the described time interval was provided by me .  I have reviewed this patient's available data, including medical history, events of note, physical examination, and all test results as part of my evaluation.   Time spent: 70 min  Thurmond Hildebran, Roselind Messier [facility] Pager [phone]

## 2018-06-25 NOTE — Anesthesia Postprocedure Evaluation (Signed)
Anesthesia Post Note  Patient: Courtney [name]  Procedure(s) Performed: IRRIGATION AND DEBRIDEMENT PERIRECTAL ABCESS (N/A Groin)     Patient location during evaluation: PACU Anesthesia Type: General Level of consciousness: awake and patient cooperative Pain management: pain level controlled Vital Signs Assessment: post-procedure vital signs reviewed and stable Respiratory status: spontaneous breathing, nonlabored ventilation, respiratory function stable and patient connected to nasal cannula oxygen Cardiovascular status: stable Postop Assessment: no apparent nausea or vomiting Anesthetic complications: no    Last Vitals:  Vitals:   06/25/18 2015 06/25/18 2030  BP: (!) 143/91 137/87  Pulse: 89 92  Resp: (!) 24 (!) 24  Temp:    SpO2: 100% 100%    Last Pain:  Vitals:   06/25/18 2030  TempSrc:   PainSc: 0-No pain                 [name]

## 2018-06-25 NOTE — Progress Notes (Signed)
Dr Maple Hudson in unit and ordered resumption of K+ replacement

## 2018-06-25 NOTE — Telephone Encounter (Signed)
Agree 

## 2018-06-25 NOTE — Progress Notes (Signed)
CRITICAL VALUE ALERT  Critical Value:  Lactic Acid  Date & Time Notied:  06-25-18 16:44  Provider Notified: Dr. Maisie Fus  Orders Received/Actions taken: no orders given

## 2018-06-25 NOTE — Transfer of Care (Signed)
Immediate Anesthesia Transfer of Care Note  Patient: Courtney [name]  Procedure(s) Performed: IRRIGATION AND DEBRIDEMENT PERIRECTAL ABCESS (N/A Groin)  Patient Location: PACU  Anesthesia Type:General  Level of Consciousness: awake, alert  and patient cooperative  Airway & Oxygen Therapy: Patient Spontanous Breathing and Patient connected to face mask oxygen  Post-op Assessment: Report given to RN and Post -op Vital signs reviewed and stable  Post vital signs: Reviewed and stable  Last Vitals:  Vitals Value Taken Time  BP 150/78 06/25/2018  5:45 PM  Temp    Pulse    Resp 23 06/25/2018  5:47 PM  SpO2    Vitals shown include unvalidated device data.  Last Pain:  Vitals:   06/25/18 1625  TempSrc: Oral  PainSc: 0-No pain         Complications: No apparent anesthesia complications

## 2018-06-25 NOTE — ED Triage Notes (Addendum)
Patient c/o of boil on left buttock that patient notices yesterday.  10/10 pain.   Patient also states she has fell 3X this past week.  Denies hitting head or LOC.  Patient uses a cane at home.   Patient in wheelchair in triage.  A/oX4 Here with husband.

## 2018-06-25 NOTE — ED Notes (Signed)
Pt assisted to bed from wheelchair, pt sitting on side of bed and turns on her stomach and slides herself to her knees on the floor. Pt assisted by staff members onto backboard and put into bed.

## 2018-06-25 NOTE — Progress Notes (Signed)
Patient denies home CPAP use or a sleep apnea diagnosis. Furthermore, she does not recall ever having a sleep study in a lab or at home. Nocturnal CPAP not started at this time.

## 2018-06-25 NOTE — Anesthesia Procedure Notes (Addendum)
Central Venous Catheter Insertion Performed by: Val Eagle, MD, anesthesiologist Start/End2/25/2020 5:07 PM, 06/25/2018 5:12 PM Patient location: OR. Preanesthetic checklist: patient identified, IV checked, site marked, risks and benefits discussed, surgical consent, monitors and equipment checked, pre-op evaluation, timeout performed and anesthesia consent Lidocaine 1% used for infiltration and patient sedated Hand hygiene performed  and maximum sterile barriers used  Catheter size: 8 Fr Total catheter length 16. Central line was placed.Double lumen Procedure performed using ultrasound guided technique. Ultrasound Notes:anatomy identified, needle tip was noted to be adjacent to the nerve/plexus identified, no ultrasound evidence of intravascular and/or intraneural injection and image(s) printed for medical record Attempts: 1 Following insertion, dressing applied, line sutured and Biopatch. Post procedure assessment: blood return through all ports, free fluid flow and no air  Patient tolerated the procedure well with no immediate complications.

## 2018-06-25 NOTE — ED Notes (Signed)
Date and time results received: 06/25/18 1:12 PM  (use smartphrase ".now" to insert current time)  Test: Troponin Critical Value: 0.03 Lactic 5.0  Name of Provider Notified: Charm Barges  Orders Received? Or Actions Taken?: awaiting orders

## 2018-06-25 NOTE — Op Note (Signed)
06/25/2018  5:21 PM  PATIENT:  Courtney [name]  73 y.o. female  Patient Care Team: Barbarann Ehlers as PCP - General (Physician Assistant) Benjiman Core, MD (Hematology and Oncology)  PRE-OPERATIVE DIAGNOSIS:  Alvera Singh GANGRENE  POST-OPERATIVE DIAGNOSIS:  Alvera Singh GANGRENE  PROCEDURE:   INCISION AND DEBRIDEMENT PERIRECTAL ABCESS   Surgeon(s): Romie Levee, MD  ASSISTANT: none   ANESTHESIA:   general  EBL:  No intake/output data recorded.  DRAINS: none   SPECIMEN:  Source of Specimen:  necrotic tissue  DISPOSITION OF SPECIMEN:  Micro  COUNTS:  YES  PLAN OF CARE: Admit to inpatient   PATIENT DISPOSITION:  PACU - guarded condition.  INDICATION: 73 y.o. F with uncontrolled DM who presents with sepsis and perirectal abscess extending into the buttock and anteriorly into the labia   OR FINDINGS: Large right-sided perirectal abscess extending into the right buttock and tracking upwards into the preperitoneal space anteriorly around the right labia.    DESCRIPTION: the patient was identified in the preoperative holding area and taken to the OR where they were laid supine on the operating room table.   General anesthesia was induced without difficulty. SCDs were also noted to be in place prior to the initiation of anesthesia.  The patient was then placed in lithotomy position and prepped and draped in the usual sterile fashion.     A surgical timeout was performed indicating the correct patient, procedure, positioning and need for preoperative antibiotics.   There was an obvious area of fluctuance within the right buttock.  A cruciate incision was made over this using electrocautery.  Dissection was carried down to the area of fluctuance.  Purulent tissue was removed.  Necrotic tissue extended posteriorly into the right buttock.  This was mainly in the ischio rectal space.  I debrided some of this tissue to allow for drainage of purulent material.  Some of this debrided  tissue was sent to microbiology for culture.  I then packed approximately three fourths of a Kerlix sponge into the perirectal space.  Patient will need a second look procedure at some point in the next 24 to 48 hours.

## 2018-06-25 NOTE — Anesthesia Preprocedure Evaluation (Addendum)
Anesthesia Evaluation  Patient identified by MRN, date of birth, ID band Patient awake    Reviewed: Allergy & Precautions, NPO status , Patient's Chart, lab work & pertinent test results, reviewed documented beta blocker date and time   Airway Mallampati: II  TM Distance: >3 FB Neck ROM: Full    Dental  (+) Dental Advisory Given   Pulmonary asthma , COPD, Current Smoker,    breath sounds clear to auscultation       Cardiovascular hypertension, Pt. on home beta blockers and Pt. on medications + CAD and + Past MI   Rhythm:Regular Rate:Tachycardia     Neuro/Psych negative neurological ROS  negative psych ROS   GI/Hepatic negative GI ROS, Neg liver ROS,   Endo/Other  negative endocrine ROSdiabetes  Renal/GU negative Renal ROS     Musculoskeletal  (+) Arthritis ,   Abdominal   Peds  Hematology negative hematology ROS (+) anemia ,   Anesthesia Other Findings   Reproductive/Obstetrics negative OB ROS                            Anesthesia Physical Anesthesia Plan  ASA: III and emergent  Anesthesia Plan: General   Post-op Pain Management:    Induction: Intravenous  PONV Risk Score and Plan: 4 or greater and Ondansetron and Treatment may vary due to age or medical condition  Airway Management Planned: Oral ETT  Additional Equipment: Arterial line  Intra-op Plan:   Post-operative Plan: Possible Post-op intubation/ventilation  Informed Consent: I have reviewed the patients History and Physical, chart, labs and discussed the procedure including the risks, benefits and alternatives for the proposed anesthesia with the patient or authorized representative who has indicated his/her understanding and acceptance.     Dental advisory given  Plan Discussed with: CRNA  Anesthesia Plan Comments:       Anesthesia Quick Evaluation

## 2018-06-25 NOTE — Anesthesia Procedure Notes (Signed)
Procedure Name: Intubation Date/Time: 06/25/2018 4:56 PM Performed by: West Pugh, CRNA Pre-anesthesia Checklist: Patient identified, Emergency Drugs available, Suction available, Patient being monitored and Timeout performed Patient Re-evaluated:Patient Re-evaluated prior to induction Oxygen Delivery Method: Circle system utilized Preoxygenation: Pre-oxygenation with 100% oxygen Induction Type: IV induction Laryngoscope Size: Mac and 4 Grade View: Grade I Tube type: Subglottic suction tube Tube size: 7.5 mm Number of attempts: 1 Airway Equipment and Method: Stylet Placement Confirmation: ETT inserted through vocal cords under direct vision,  positive ETCO2,  CO2 detector and breath sounds checked- equal and bilateral Secured at: 22 cm Tube secured with: Tape Dental Injury: Teeth and Oropharynx as per pre-operative assessment

## 2018-06-25 NOTE — Consult Note (Addendum)
[facility] Surgery Consult Note  Courtney [name] [birth year]  [id number].    Requesting MD: Meridee Score Chief Complaint/Reason for Consult: Fournier's gangrene  HPI:  Courtney [name] is a 73yo female PMH obesity, uncontrolled DM, HTN, HLD, CAD, h/o STEMI 2011 s/p RCA stent, and COPD, who presented to [facility] today for a boil to her right gluteal region. The patient reports that 2 days ago she first noticed a foul smell and purulent drainage coming from her right gluteal region. Since, the patient has been having pain that tracts from her right buttocks to her perineum, right upper leg, pubic area and right lower abdomen. The patient reports associated generalized weakness. No fever, chills, CP, SOB, N/V/D or urinary symptoms. The patient is unsure of her last HgbA1c. She reports she does not monitor her blood sugars at home. She is not compliant with her insulin medication.   In the ED patient was afebrile, HR 103-108, RR 18, BP 109/69, O2 100% on RA. WBC 39.2, Lactic acid 5.0. Glucose 257, Anion Gap 17, Cr 1.05, K 2.8. 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 The patient was started on Rocephin, Clindamycin and Vancomycin. General surgery was consulted to see the patient  Last meal: 1/2 cup coffee at 9am  PMH significant for as above Abdominal surgical history: cholecystectomy, hysterectomy Anticoagulants: none Smokes 0.5 PPD Lives at home with her husband  ROS: Review of Systems  All other systems reviewed and are negative.   All systems reviewed and otherwise negative except for as above  Family History  Problem Relation Age of Onset  . Hypertension Brother   . Heart attack Neg Hx     Past Medical History:  Diagnosis Date  . Anemia   . Arthritis   . Asthma   . CAD (coronary artery disease)   . COPD (chronic obstructive pulmonary disease) (HCC)   . Diabetes mellitus   . Hyperlipidemia   . Hypertension   . Myocardial infarct (HCC)   . Postmenopausal  atrophic vaginitis   . Restless leg syndrome     Past Surgical History:  Procedure Laterality Date  . CHOLECYSTECTOMY  1970's  . OTHER SURGICAL HISTORY  1970's   hysterectomy    Social History:  reports that she has been smoking. She has been smoking about 0.50 packs per day. She has never used smokeless tobacco. She reports that she does not drink alcohol or use drugs.  Allergies:  Allergies  Allergen Reactions  . Eggs Or Egg-Derived Products   . Penicillins     Did it involve swelling of the face/tongue/throat, SOB, or low BP? No Did it involve sudden or severe rash/hives, skin peeling, or any reaction on the inside of your mouth or nose? Yes Did you need to seek medical attention at a hospital or doctor's office? yes When did it last happen?more than 10 years If all above answers are "NO", may proceed with cephalosporin use.     (Not in a hospital admission)   Prior to Admission medications   Medication Sig Start Date End Date Taking? Authorizing Provider  LANTUS SOLOSTAR 100 UNIT/ML Solostar Pen Inject 25 Units into the skin daily. 04/16/18  Yes [provider]  lisinopril (PRINIVIL,ZESTRIL) 5 MG tablet Take 1 tablet (5 mg total) by mouth daily. Patient not taking: Reported on 06/25/2018 09/04/16 12/03/16  Corky Crafts, MD  metoprolol succinate (TOPROL-XL) 25 MG 24 hr tablet Take 1 tablet (25 mg total) by mouth daily. Patient not taking:  Reported on 06/25/2018 10/02/16   Corky Crafts, MD  rosuvastatin (CRESTOR) 20 MG tablet Take 1 tablet (20 mg total) by mouth daily. Patient not taking: Reported on 06/25/2018 09/05/16 12/04/16  Corky Crafts, MD    Blood pressure (!) 168/94, pulse (!) 103, temperature 98 F (36.7 C), temperature source Oral, resp. rate 15, SpO2 100 %. Physical Exam: Physical Exam  Constitutional: She is oriented to person, place, and time and well-developed, well-nourished, and in no distress. No distress.  HENT:  Head:  Normocephalic and atraumatic.  Right Ear: External ear normal.  Left Ear: External ear normal.  Nose: Nose normal.  Mouth/Throat: No oropharyngeal exudate.  Eyes: Pupils are equal, round, and reactive to light. Conjunctivae and EOM are normal. No scleral icterus.  Neck: Neck supple.  Cardiovascular: Regular rhythm, normal heart sounds and normal pulses. Tachycardia present.  Pulses:      Radial pulses are 2+ on the right side and 2+ on the left side.       Dorsalis pedis pulses are 2+ on the right side and 2+ on the left side.  Pulmonary/Chest: Effort normal and breath sounds normal. No accessory muscle usage. No tachypnea. No respiratory distress.  Abdominal: Soft. Normal appearance and bowel sounds are normal. There is abdominal tenderness in the right lower quadrant. There is no rigidity, no rebound and no guarding.  Genitourinary:    Genitourinary Comments: The patient has a large area of erythematous and indurated tissue that extends from the right posterior buttocks, to the perineum. There is an area of malodorous purulent drainage from the right upper buttocks with a 1cm opening. Patient also has tenderness over her right upper thigh and RLQ of her abdomen   Musculoskeletal:     Comments: Moves all extremities  Neurological: She is alert and oriented to person, place, and time. She exhibits normal muscle tone. GCS score is 15.  Skin: Skin is warm and dry.  Nursing note and vitals reviewed.    Results for orders placed or performed during the hospital encounter of 06/25/18 (from the past 48 hour(s))  Basic metabolic panel     Status: Abnormal   Collection Time: 06/25/18 12:09 PM  Result Value Ref Range   Sodium 135 135 - 145 mmol/L   Potassium 2.8 (L) 3.5 - 5.1 mmol/L   Chloride 93 (L) 98 - 111 mmol/L   CO2 25 22 - 32 mmol/L   Glucose, Bld 257 (H) 70 - 99 mg/dL   BUN 25 (H) 8 - 23 mg/dL   Creatinine, Ser 9.75 (H) 0.44 - 1.00 mg/dL   Calcium 9.1 8.9 - 30.0 mg/dL   GFR calc non  Af Amer 52 (L) >60 mL/min   GFR calc Af Amer >60 >60 mL/min   Anion gap 17 (H) 5 - 15    Comment: Performed at [facility], [address], [place], [place] [address]  CBC with Differential     Status: Abnormal   Collection Time: 06/25/18 12:09 PM  Result Value Ref Range   WBC 39.2 (H) 4.0 - 10.5 K/uL   RBC 5.48 (H) 3.87 - 5.11 MIL/uL   Hemoglobin 13.5 12.0 - 15.0 g/dL   HCT 11.1 73.5 - 67.0 %   MCV 83.9 80.0 - 100.0 fL   MCH 24.6 (L) 26.0 - 34.0 pg   MCHC 29.3 (L) 30.0 - 36.0 g/dL   RDW 14.1 03.0 - 13.1 %   Platelets 365 150 - 400 K/uL   nRBC  0.0 0.0 - 0.2 %   Neutrophils Relative % 88 %   Neutro Abs 34.2 (H) 1.7 - 7.7 K/uL   Lymphocytes Relative 4 %   Lymphs Abs 1.7 0.7 - 4.0 K/uL   Monocytes Relative 4 %   Monocytes Absolute 1.6 (H) 0.1 - 1.0 K/uL   Eosinophils Relative 0 %   Eosinophils Absolute 0.0 0.0 - 0.5 K/uL   Basophils Relative 0 %   Basophils Absolute 0.0 0.0 - 0.1 K/uL   WBC Morphology MILD LEFT SHIFT (1-5% METAS, OCC MYELO, OCC BANDS)    Immature Granulocytes 4 %   Abs Immature Granulocytes 1.67 (H) 0.00 - 0.07 K/uL    Comment: Performed at [facility], [address] [place], [place] [address]  Troponin I - Once     Status: Abnormal   Collection Time: 06/25/18 12:09 PM  Result Value Ref Range   Troponin I 0.03 (HH) <0.03 ng/mL    Comment: CRITICAL RESULT CALLED TO, READ BACK BY AND VERIFIED WITHLynnell Dike RN AT 1312 06/25/18 MULLINS,T Performed at [facility], [address] [place], [place] [address]   Lactic acid, plasma     Status: Abnormal   Collection Time: 06/25/18 12:11 PM  Result Value Ref Range   Lactic Acid, Venous 5.0 (HH) 0.5 - 1.9 mmol/L    Comment: CRITICAL RESULT CALLED TO, READ BACK BY AND VERIFIED WITH: BRILL,M. RN AT 1312 06/25/18 MULLINS,T Performed at [facility], [address], [place], [place] [address]    No results found.  Anti-infectives (From  admission, onward)   Start     Dose/Rate Route Frequency Ordered Stop   06/25/18 1500  clindamycin (CLEOCIN) IVPB 600 mg     600 mg 100 mL/hr over 30 Minutes Intravenous  Once 06/25/18 1453     06/25/18 1330  vancomycin (VANCOCIN) IVPB 1000 mg/200 mL premix     1,000 mg 200 mL/hr over 60 Minutes Intravenous  Once 06/25/18 1318     06/25/18 1330  cefTRIAXone (ROCEPHIN) 2 g in sodium chloride 0.9 % 100 mL IVPB     2 g 200 mL/hr over 30 Minutes Intravenous Every 24 hours 06/25/18 1318         Assessment/Plan Uncontrolled DM HTN HLD CAD COPD  Sepsis Fournier's Gangrene  - Patient will require emergent irrigation and debridement in the OR with Dr. Romie Levee who has discussed risk and benefits with the patient. Patient and her husband verbalize understanding and would like to proceed. Patient will need medical admission for her sepsis and poorly controlled diabetes.   ID - rocephin/clindamycin/vancomycin 2/25>> VTE - SCDs FEN - IVF, NPO Foley - None Follow up - TBD  Leary Roca, [facility] Surgery 4:06 PM 06/25/18 [phone]

## 2018-06-25 NOTE — ED Provider Notes (Signed)
[facility]-EMERGENCY DEPT Provider Note   CSN: [id number] Arrival date & time: 06/25/18  1029    History   Chief Complaint Chief Complaint  Patient presents with  . Recurrent Skin Infections    HPI Courtney [name] is a 73 y.o. female.  She is presenting with her husband for evaluation of frequent falls and a boil on her gluteal area.  She said she fell 2 times yesterday and then fell again today while transferring from the wheelchair into the bed in the emergency department.  There was not real fall so much as she was weak and lowered herself to the floor.  She denies any injuries from these falls but has been unable to get up and her husband states that called 911 twice yesterday for this.  She states she feels just generally weak and she has had a boil on her backside for a few days that is been progressively worse and was severe last night.  She felt it draining this morning and the pain is improved.  No fevers or chills.  No chest pain abdominal pain cough diarrhea or constipation.  She has noticed some dysuria.  No head or neck injury no headache no numbness or weakness focally.     The history is provided by the patient.  Abscess  Location:  Pelvis Pelvic abscess location:  Gluteal cleft Abscess quality: draining, induration, painful and redness   Red streaking: no   Progression:  Unchanged Pain details:    Quality:  Throbbing   Severity:  Moderate   Timing:  Constant   Progression:  Improving Chronicity:  Recurrent Context: diabetes   Context: not injected drug use   Relieved by:  Draining/squeezing Worsened by:  Nothing Ineffective treatments:  None tried Associated symptoms: no fever, no headaches, no nausea and no vomiting   Risk factors: prior abscess     Past Medical History:  Diagnosis Date  . Anemia   . Arthritis   . Asthma   . CAD (coronary artery disease)   . COPD (chronic obstructive pulmonary disease) (HCC)   . Diabetes mellitus    . Hyperlipidemia   . Hypertension   . Myocardial infarct (HCC)   . Postmenopausal atrophic vaginitis   . Restless leg syndrome     Patient Active Problem List   Diagnosis Date Noted  . Hypertensive heart disease 08/21/2016  . Hypertension   . Anemia   . Old MI (myocardial infarction)   . Hyperlipidemia   . Postmenopausal atrophic vaginitis   . Restless leg syndrome   . COPD (chronic obstructive pulmonary disease) (HCC)   . Arthritis   . CAD (coronary artery disease)   . Iron (Fe) deficiency anemia 04/05/2011    Past Surgical History:  Procedure Laterality Date  . CHOLECYSTECTOMY  1970's  . OTHER SURGICAL HISTORY  1970's   hysterectomy     OB History   No obstetric history on file.      Home Medications    Prior to Admission medications   Medication Sig Start Date End Date Taking? Authorizing Provider  albuterol (PROVENTIL HFA;VENTOLIN HFA) 108 (90 BASE) MCG/ACT inhaler Inhale 2 puffs into the lungs every 6 (six) hours as needed. For shortness of breath    [provider]  budesonide-formoterol (SYMBICORT) 160-4.5 MCG/ACT inhaler Inhale 2 puffs into the lungs 2 (two) times daily.    [provider]  Calcium Carbonate-Vitamin D (CALTRATE 600+D) 600-400 MG-UNIT per chew tablet Chew 1 tablet by mouth  2 (two) times daily.      [provider]  cholecalciferol (VITAMIN D) 1000 UNITS tablet Take 2,000 Units by mouth daily.      [provider]  docusate sodium (COLACE) 100 MG capsule Take 100 mg by mouth 2 (two) times daily.     [provider]  lisinopril (PRINIVIL,ZESTRIL) 5 MG tablet Take 1 tablet (5 mg total) by mouth daily. 09/04/16 12/03/16  Corky Crafts, MD  metFORMIN (GLUCOPHAGE) 500 MG tablet Take 1,000 mg by mouth 2 (two) times daily with a meal. Patient also takes 500 mg at lunch    [provider]  metoprolol succinate (TOPROL-XL) 25 MG 24 hr tablet Take 1 tablet (25 mg total) by mouth daily. 10/02/16    Corky Crafts, MD  rosuvastatin (CRESTOR) 20 MG tablet Take 1 tablet (20 mg total) by mouth daily. 09/05/16 12/04/16  Corky Crafts, MD    Family History Family History  Problem Relation Age of Onset  . Hypertension Brother   . Heart attack Neg Hx     Social History Social History   Tobacco Use  . Smoking status: Current Every Day Smoker    Packs/day: 0.50  . Smokeless tobacco: Never Used  Substance Use Topics  . Alcohol use: No  . Drug use: No     Allergies   Eggs or egg-derived products and Penicillins   Review of Systems Review of Systems  Constitutional: Negative for fever.  HENT: Negative for sore throat.   Eyes: Negative for visual disturbance.  Respiratory: Negative for shortness of breath.   Cardiovascular: Negative for chest pain.  Gastrointestinal: Negative for abdominal pain, nausea and vomiting.  Genitourinary: Negative for dysuria.  Musculoskeletal: Negative for back pain and neck pain.  Skin: Positive for wound. Negative for rash.  Neurological: Negative for headaches.     Physical Exam Updated Vital Signs BP 109/69 (BP Location: Left Arm)   Pulse (!) 108   Temp 98 F (36.7 C) (Oral)   Resp 18   SpO2 100%   Physical Exam Vitals signs and nursing note reviewed.  Constitutional:      General: She is not in acute distress.    Appearance: She is well-developed.  HENT:     Head: Normocephalic and atraumatic.  Eyes:     Conjunctiva/sclera: Conjunctivae normal.  Neck:     Musculoskeletal: Neck supple.  Cardiovascular:     Rate and Rhythm: Regular rhythm. Tachycardia present.     Heart sounds: No murmur.  Pulmonary:     Effort: Pulmonary effort is normal. No respiratory distress.     Breath sounds: Normal breath sounds.  Abdominal:     Palpations: Abdomen is soft.     Tenderness: There is no abdominal tenderness. There is no guarding or rebound.  Genitourinary:    Comments: She is tender through her mons although no crepitus is  palpated. Musculoskeletal: Normal range of motion.        General: No deformity or signs of injury.  Skin:    General: Skin is warm and dry.     Capillary Refill: Capillary refill takes less than 2 seconds.       Neurological:     General: No focal deficit present.     Mental Status: She is alert and oriented to person, place, and time.     Motor: No weakness.      ED Treatments / Results  Labs (all labs ordered are listed, but only abnormal results  are displayed) Labs Reviewed  BASIC METABOLIC PANEL - Abnormal; Notable for the following components:      Result Value   Potassium 2.8 (*)    Chloride 93 (*)    Glucose, Bld 257 (*)    BUN 25 (*)    Creatinine, Ser 1.05 (*)    GFR calc non Af Amer 52 (*)    Anion gap 17 (*)    All other components within normal limits  CBC WITH DIFFERENTIAL/PLATELET - Abnormal; Notable for the following components:   WBC 39.2 (*)    RBC 5.48 (*)    MCH 24.6 (*)    MCHC 29.3 (*)    Neutro Abs 34.2 (*)    Monocytes Absolute 1.6 (*)    Abs Immature Granulocytes 1.67 (*)    All other components within normal limits  TROPONIN I - Abnormal; Notable for the following components:   Troponin I 0.03 (*)    All other components within normal limits  LACTIC ACID, PLASMA - Abnormal; Notable for the following components:   Lactic Acid, Venous 5.0 (*)    All other components within normal limits  URINE CULTURE  CULTURE, BLOOD (ROUTINE X 2)  CULTURE, BLOOD (ROUTINE X 2)  URINALYSIS, ROUTINE W REFLEX MICROSCOPIC  LACTIC ACID, PLASMA    EKG EKG Interpretation  Date/Time:  Tuesday June 25 2018 13:05:52 EST Ventricular Rate:  103 PR Interval:    QRS Duration: 99 QT Interval:  354 QTC Calculation: 464 R Axis:   10 Text Interpretation:  Sinus tachycardia with irregular rate Prolonged PR interval Left ventricular hypertrophy Inferior infarct, old new inferior q waves from prior 2/13 Confirmed by Meridee Score [phone]) on 06/25/2018 1:20:38  PM   Radiology X-ray Chest Pa Or Ap  Result Date: 06/25/2018 CLINICAL DATA:  Central line placement EXAM: CHEST  1 VIEW COMPARISON:  02/11/2010 FINDINGS: Right internal jugular central line is been placed with the tip in the lower SVC. No pneumothorax. Heart is mildly enlarged with perihilar and lower lobe opacities and diffuse interstitial prominence, likely mild edema/CHF. No effusions or acute bony abnormality. IMPRESSION: Right central line tip in the SVC.  No pneumothorax. Mild edema/CHF. Electronically Signed   By: Charlett Nose M.D.   On: 06/25/2018 18:38   Ct Abdomen Pelvis W Contrast  Result Date: 06/25/2018 CLINICAL DATA:  73 year old female with ball on buttock noticed yesterday. Initial encounter. EXAM: CT ABDOMEN AND PELVIS WITH CONTRAST TECHNIQUE: Multidetector CT imaging of the abdomen and pelvis was performed using the standard protocol following bolus administration of intravenous contrast. CONTRAST:  ISOVUE-300 IOPAMIDOL (ISOVUE-300) INJECTION 61% COMPARISON:  None. FINDINGS: Lower chest: Minimal basilar atelectasis. Mild cardiomegaly. Coronary artery calcifications. Hepatobiliary: No worrisome hepatic lesion. Suspect fatty infiltration. Post cholecystectomy. Pancreas: No worrisome pancreatic mass or inflammation. Spleen: Subcentimeter area of enhancement dome of spleen of indeterminate etiology or significance. Spleen not enlarged. Adrenals/Urinary Tract: No obstructing stone or hydronephrosis. Several renal low-density structures too small to characterize although statistically likely cysts. Hyperplasia adrenal glands. Prominent size urinary bladder. Noncontrast filled imaging without acute abnormality. Stomach/Bowel: Besides necrotizing fasciitis impressing upon the right lateral aspect of the rectum, no primary bowel inflammatory process is noted. Appendix not clearly delineated. Portions of the stomach, small bowel and colon are under distended limiting evaluation.  Vascular/Lymphatic: Atherosclerotic changes aorta and aortic branch vessels. No abdominal aortic aneurysm or large vessel occlusion. Inguinal adenopathy greater on right. Slightly prominent size right external iliac lymph node. Reproductive: Post hysterectomy.  No worrisome  adnexal mass. 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 Soft tissue gas and inflammatory process extends from the upper right thigh/right buttock region anteriorly and superiorly impressing upon the right lateral aspect of the rectum and lower cervix. Anterior extension into the mons region greater on the right but also extending into the left mons. Gas and inflammatory soft tissue process than extends anterior to the pubic symphysis and anterior to the right femoral region (immediately adjacent to femoral vessels) and along the anterior and lateral aspect of the right pelvic and right lower abdominal wall. The rectum is immediately adjacent to this inflammatory process and displaced slightly to the left. No clear communication with the rectum identified. Lateral to the upper right femur is a 1.3 cm subcutaneous lesion possibly a sebaceous cyst. If no free intraperitoneal air or drainable fluid collection. No bowel containing hernia. Small fat containing hernia periumbilical region. Musculoskeletal: Degenerative changes lower thoracic and lumbar spine most notable L4-5 and L5-S1 level. Mild hip joint degenerative changes and sacroiliac joint degenerative changes. IMPRESSION: 1. 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 Soft tissue gas and inflammatory process extends from the upper right thigh/right buttock region anteriorly and superiorly impressing upon the right lateral aspect of the rectum and lower cervix. Anterior extension into the mons region greater on the right but also extending into the left mons. Gas and inflammatory soft tissue process than extends anterior to  the pubic symphysis and anterior to the right femoral region (immediately adjacent to femoral vessels) and along the anterior and lateral aspect of the right pelvic and right lower abdominal wall. 2. The rectum is immediately adjacent to this inflammatory process and displaced slightly to the left. No clear communication with the rectum identified. 3. Inguinal adenopathy greater on the right. 4. Lateral to the upper right femur is a 1.3 cm subcutaneous lesion possibly a sebaceous cyst. 5. Subcentimeter hyperdensity dome of the spleen of indeterminate etiology/significance. 6. Scattered small low-density renal lesions too small to characterize statistically likely cysts. 7. Suspect fatty infiltration liver. 8. Post cholecystectomy. 9. Appendix not visualized. 10.  Aortic Atherosclerosis (ICD10-I70.0). 11. Mild cardiomegaly with coronary artery calcifications. 12. Degenerative changes lower thoracic and lumbar spine. These results were called by telephone at the time of interpretation on 06/25/2018 at 3:22 pm to Dr. Meridee Score , who verbally acknowledged these results. Electronically Signed   By: Lacy Duverney M.D.   On: 06/25/2018 15:51    Procedures .Critical Care Performed by: Terrilee Files, MD Authorized by: Terrilee Files, MD   Critical care provider statement:    Critical care time (minutes):  45   Critical care time was exclusive of:  Separately billable procedures and treating other patients   Critical care was necessary to treat or prevent imminent or life-threatening deterioration of the following conditions:  Sepsis   Critical care was time spent personally by me on the following activities:  Discussions with consultants, evaluation of patient's response to treatment, examination of patient, ordering and performing treatments and interventions, ordering and review of laboratory studies, ordering and review of radiographic studies, pulse oximetry, re-evaluation of patient's condition,  obtaining history from patient or surrogate, review of old charts and development of treatment plan with patient or surrogate   I assumed direction of critical care for this patient from another provider in my specialty: no     (including critical care time)  Medications Ordered in ED Medications  sodium chloride 0.9 % bolus 1,000 mL (has no administration  in time range)    And  sodium chloride 0.9 % bolus 1,000 mL (1,000 mLs Intravenous New Bag/Given 06/25/18 1405)    And  sodium chloride 0.9 % bolus 500 mL (has no administration in time range)  vancomycin (VANCOCIN) IVPB 1000 mg/200 mL premix (has no administration in time range)  cefTRIAXone (ROCEPHIN) 2 g in sodium chloride 0.9 % 100 mL IVPB (2 g Intravenous New Bag/Given 06/25/18 1402)  potassium chloride 10 mEq in 100 mL IVPB (has no administration in time range)  iopamidol (ISOVUE-300) 61 % injection (has no administration in time range)  sodium chloride (PF) 0.9 % injection (has no administration in time range)  clindamycin (CLEOCIN) IVPB 600 mg (has no administration in time range)  iopamidol (ISOVUE-300) 61 % injection 100 mL (100 mLs Intravenous Contrast Given 06/25/18 1443)     Initial Impression / Assessment and Plan / ED Course  I have reviewed the triage vital signs and the nursing notes.  Pertinent labs & imaging results that were available during my care of the patient were reviewed by me and considered in my medical decision making (see chart for details).  Clinical Course as of Jun 26 744  Tue Jun 25, 2018  1318 Patient's lab work beginning to come back now.  Her potassium was low at 2.8.  She is also got a lactate of 5 and an increased WBCs at 39.  I have activated code sepsis and order empiric antibiotics for cellulitis.   [MB]  1501 Reviewed the patient's CAT scan I am concerned for Fourniers as there is air tracking buttock perineum over to the mons.  I went back and reexamined the patient and she is quite tender  through the mons which I had not appreciated during her abdominal exam.  Placed emergent phone call into general surgery and actually was able to find the team here already seeing another patient.   [MB]  1508 Received a call from radiologist that he is concerned the patient is 40 years.  They are commenting upon air in the tissue going from the buttock through the perineum up in the mons and then tracking up the right abdominal wall.  Surgery is evaluating the patient now and they are anticipated to go to the operating room.  They asked about place a call into a medical service for admission.  Critical care is been paged.   [MB]  1541 Discussed with Dr. Marchelle Gearing from critical care [place].  He said there are ICU beds available but he will not accept the patient until the patient is in the PACU whether they are extubated or not and depending on vital sign stability.   [MB]  1552 Discussed with [facility] hospitalist Dr. Ephriam Knuckles who states he cannot accept the patient until she is out of the operating room in the PACU and is deemed appropriate for a stepdown bed.  The surgical team will need to contact either the ICU or hospitalist service for admission   [MB]    Clinical Course User Index [MB] Terrilee Files, MD       Final Clinical Impressions(s) / ED Diagnoses   Final diagnoses:  Necrotizing fasciitis (HCC)  Sepsis, due to unspecified organism, unspecified whether acute organ dysfunction present [facility])    ED Discharge Orders    None       Terrilee Files, MD 06/26/18 [phone]

## 2018-06-25 NOTE — Progress Notes (Signed)
A consult was received from an ED physician for vancomycin per pharmacy dosing (for an indication other than meningitis). The patient's profile has been reviewed for ht/wt/allergies/indication/available labs. A one time order has been placed for the above antibiotics.  Further antibiotics/pharmacy consults should be ordered by admitting physician if indicated.                       Bernadene Person, PharmD, BCPS [phone] 06/25/2018, 3:08 PM

## 2018-06-25 NOTE — ED Notes (Signed)
Triage brought patient back to room. Patient was unable to stand and pivot. Patient went down on knees. Tried to help triage lift patient

## 2018-06-25 NOTE — Progress Notes (Signed)
Call from [facility] PA - Brooke and ER Doc    S: fournier gangrene. Going emergently to OR O Vitals:   06/25/18 1050 06/25/18 1318 06/25/18 1503  BP: 109/69 130/73 (!) 168/94  Pulse: (!) 108 (!) 103   Resp: 18 18 15   Temp: 98 F (36.7 C)    TempSrc: Oral    SpO2: 100% 100%     .  LABS    PULMONARY No results for input(s): PHART, PCO2ART, PO2ART, HCO3, TCO2, O2SAT in the last 168 hours.  Invalid input(s): PCO2, PO2  CBC Recent Labs  Lab 06/25/18 1209  HGB 13.5  HCT 46.0  WBC 39.2*  PLT 365    COAGULATION No results for input(s): INR in the last 168 hours.  CARDIAC   Recent Labs  Lab 06/25/18 1209  TROPONINI 0.03*   No results for input(s): PROBNP in the last 168 hours.   CHEMISTRY Recent Labs  Lab 06/25/18 1209  NA 135  K 2.8*  CL 93*  CO2 25  GLUCOSE 257*  BUN 25*  CREATININE 1.05*  CALCIUM 9.1   CrCl cannot be calculated (Unknown ideal weight.).   LIVER No results for input(s): AST, ALT, ALKPHOS, BILITOT, PROT, ALBUMIN, INR in the last 168 hours.   INFECTIOUS Recent Labs  Lab 06/25/18 1211  LATICACIDVEN 5.0*     ENDOCRINE CBG (last 3)  No results for input(s): GLUCAP in the last 72 hours.       IMAGING x48h  - image(s) personally visualized  -   highlighted in bold No results found.     A) lactic acidosis with sepsis from fournier Currently bp ok  P To OR emergently per [facility] Aggressive fluid hydration advised over phone Pressors periop if needed Post op - if stable, TRH - IM consult but if unstable and needs CCM 0=- CCM bedside consult.   - Given time of essence, advised to call accordingly in the post op phase   CCS primary  Depending on course if issues end up being medical then over time - ccm/trh can evaluate about being primary      SIGNATURE    Dr. Kalman Shan, M.D., F.C.C.P,  Pulmonary and Critical Care Medicine Staff Physician, [facility] Center Director - Interstitial Lung Disease   Program  Pulmonary Fibrosis [facility] at [facility], [place], [address]  Pager: [phone], If no answer or between  15:00h - 7:00h: call [phone] Telephone: [phone]  3:52 PM 06/25/2018

## 2018-06-26 ENCOUNTER — Encounter (HOSPITAL_COMMUNITY): Payer: Self-pay | Admitting: General Surgery

## 2018-06-26 DIAGNOSIS — G4733 Obstructive sleep apnea (adult) (pediatric): Secondary | ICD-10-CM

## 2018-06-26 DIAGNOSIS — M729 Fibroblastic disorder, unspecified: Secondary | ICD-10-CM

## 2018-06-26 DIAGNOSIS — L8990 Pressure ulcer of unspecified site, unspecified stage: Secondary | ICD-10-CM | POA: Diagnosis present

## 2018-06-26 DIAGNOSIS — E785 Hyperlipidemia, unspecified: Secondary | ICD-10-CM

## 2018-06-26 DIAGNOSIS — J449 Chronic obstructive pulmonary disease, unspecified: Secondary | ICD-10-CM

## 2018-06-26 LAB — CBC
HEMATOCRIT: 36.6 % (ref 36.0–46.0)
HEMOGLOBIN: 11.1 g/dL — AB (ref 12.0–15.0)
MCH: 24.7 pg — ABNORMAL LOW (ref 26.0–34.0)
MCHC: 30.3 g/dL (ref 30.0–36.0)
MCV: 81.3 fL (ref 80.0–100.0)
Platelets: 278 10*3/uL (ref 150–400)
RBC: 4.5 MIL/uL (ref 3.87–5.11)
RDW: 14.2 % (ref 11.5–15.5)
WBC: 38.6 10*3/uL — ABNORMAL HIGH (ref 4.0–10.5)
nRBC: 0 % (ref 0.0–0.2)

## 2018-06-26 LAB — GLUCOSE, CAPILLARY
Glucose-Capillary: 126 mg/dL — ABNORMAL HIGH (ref 70–99)
Glucose-Capillary: 243 mg/dL — ABNORMAL HIGH (ref 70–99)

## 2018-06-26 LAB — BASIC METABOLIC PANEL
Anion gap: 9 (ref 5–15)
BUN: 21 mg/dL (ref 8–23)
CO2: 24 mmol/L (ref 22–32)
Calcium: 8.3 mg/dL — ABNORMAL LOW (ref 8.9–10.3)
Chloride: 102 mmol/L (ref 98–111)
Creatinine, Ser: 0.68 mg/dL (ref 0.44–1.00)
GFR calc Af Amer: >60 mL/min (ref >60)
GFR calc non Af Amer: >60 mL/min (ref >60)
GLUCOSE: 100 mg/dL — AB (ref 70–99)
Potassium: 3.4 mmol/L — ABNORMAL LOW (ref 3.5–5.1)
Sodium: 135 mmol/L (ref 135–145)

## 2018-06-26 LAB — URINALYSIS, ROUTINE W REFLEX MICROSCOPIC
BILIRUBIN URINE: NEGATIVE
Glucose, UA: 50 mg/dL — AB
HGB URINE DIPSTICK: NEGATIVE
Ketones, ur: 5 mg/dL — AB
LEUKOCYTE UA: NEGATIVE
Nitrite: NEGATIVE
Protein, ur: 30 mg/dL — AB
SPECIFIC GRAVITY, URINE: 1.04 — AB (ref 1.005–1.030)
pH: 6 (ref 5.0–8.0)

## 2018-06-26 LAB — ABO/RH: ABO/RH(D): A POS

## 2018-06-26 LAB — LACTIC ACID, PLASMA: LACTIC ACID, VENOUS: 1.5 mmol/L (ref 0.5–1.9)

## 2018-06-26 LAB — LIPID PANEL
Cholesterol: 82 mg/dL (ref 0–200)
HDL: 13 mg/dL — ABNORMAL LOW (ref >40)
LDL Cholesterol: 54 mg/dL (ref 0–99)
Total CHOL/HDL Ratio: 6.3 RATIO
Triglycerides: 75 mg/dL (ref <150)
VLDL: 15 mg/dL (ref 0–40)

## 2018-06-26 LAB — HEMOGLOBIN A1C
HEMOGLOBIN A1C: 12.1 % — AB (ref 4.8–5.6)
Mean Plasma Glucose: 300.57 mg/dL

## 2018-06-26 MED ORDER — VANCOMYCIN HCL 10 G IV SOLR
1500.0000 mg | INTRAVENOUS | Status: DC
  Administered 2018-06-26 – 2018-06-27 (×2): 1500 mg via INTRAVENOUS
  Filled 2018-06-26 (×3): qty 1500

## 2018-06-26 MED ORDER — ENSURE MAX PROTEIN PO LIQD
11.0000 [oz_av] | Freq: Every day | ORAL | Status: DC
  Administered 2018-06-26 – 2018-06-28 (×2): 11 [oz_av] via ORAL
  Filled 2018-06-26 (×3): qty 330

## 2018-06-26 MED ORDER — SODIUM CHLORIDE 0.9 % IV SOLN: 2.0000 g | INTRAVENOUS | Status: DC

## 2018-06-26 MED ORDER — ORAL CARE MOUTH RINSE
15.0000 mL | Freq: Two times a day (BID) | OROMUCOSAL | Status: DC
  Administered 2018-06-26 – 2018-07-07 (×14): 15 mL via OROMUCOSAL

## 2018-06-26 MED ORDER — ADULT MULTIVITAMIN W/MINERALS CH
1.0000 | ORAL_TABLET | Freq: Every day | ORAL | Status: DC
  Administered 2018-06-26 – 2018-07-08 (×12): 1 via ORAL
  Filled 2018-06-26 (×12): qty 1

## 2018-06-26 MED ORDER — INSULIN ASPART 100 UNIT/ML ~~LOC~~ SOLN
0.0000 [IU] | Freq: Three times a day (TID) | SUBCUTANEOUS | Status: DC
  Administered 2018-06-26: 3 [IU] via SUBCUTANEOUS
  Administered 2018-06-27 (×2): 2 [IU] via SUBCUTANEOUS
  Administered 2018-06-28: 3 [IU] via SUBCUTANEOUS
  Administered 2018-06-28: 2 [IU] via SUBCUTANEOUS
  Administered 2018-06-29: 5 [IU] via SUBCUTANEOUS
  Administered 2018-06-29: 1 [IU] via SUBCUTANEOUS
  Administered 2018-06-29: 3 [IU] via SUBCUTANEOUS
  Administered 2018-06-30 (×2): 5 [IU] via SUBCUTANEOUS
  Administered 2018-06-30: 3 [IU] via SUBCUTANEOUS
  Administered 2018-07-01: 2 [IU] via SUBCUTANEOUS
  Administered 2018-07-01: 1 [IU] via SUBCUTANEOUS
  Administered 2018-07-02: 2 [IU] via SUBCUTANEOUS
  Administered 2018-07-02: 3 [IU] via SUBCUTANEOUS
  Administered 2018-07-03: 1 [IU] via SUBCUTANEOUS
  Administered 2018-07-03: 2 [IU] via SUBCUTANEOUS
  Administered 2018-07-04: 3 [IU] via SUBCUTANEOUS
  Administered 2018-07-04: 2 [IU] via SUBCUTANEOUS
  Administered 2018-07-04: 1 [IU] via SUBCUTANEOUS
  Administered 2018-07-05: 2 [IU] via SUBCUTANEOUS
  Administered 2018-07-05: 1 [IU] via SUBCUTANEOUS
  Administered 2018-07-05: 2 [IU] via SUBCUTANEOUS
  Administered 2018-07-06: 1 [IU] via SUBCUTANEOUS
  Administered 2018-07-07: 2 [IU] via SUBCUTANEOUS
  Administered 2018-07-07: 1 [IU] via SUBCUTANEOUS

## 2018-06-26 MED ORDER — METOPROLOL SUCCINATE ER 25 MG PO TB24
25.0000 mg | ORAL_TABLET | Freq: Every day | ORAL | Status: DC
  Administered 2018-06-26 – 2018-06-29 (×4): 25 mg via ORAL
  Filled 2018-06-26 (×5): qty 1

## 2018-06-26 MED ORDER — MORPHINE SULFATE (PF) 2 MG/ML IV SOLN
1.0000 mg | INTRAVENOUS | Status: DC | PRN
  Administered 2018-06-28 (×3): 2 mg via INTRAVENOUS
  Administered 2018-06-29: 1 mg via INTRAVENOUS
  Administered 2018-06-29 – 2018-07-07 (×13): 2 mg via INTRAVENOUS
  Filled 2018-06-26 (×18): qty 1

## 2018-06-26 MED ORDER — HEPARIN SODIUM (PORCINE) 5000 UNIT/ML IJ SOLN
5000.0000 [IU] | Freq: Three times a day (TID) | INTRAMUSCULAR | Status: DC
  Administered 2018-06-26 – 2018-07-01 (×14): 5000 [IU] via SUBCUTANEOUS
  Filled 2018-06-26 (×13): qty 1

## 2018-06-26 MED ORDER — ROSUVASTATIN CALCIUM 10 MG PO TABS
20.0000 mg | ORAL_TABLET | Freq: Every day | ORAL | Status: DC
  Administered 2018-06-26 – 2018-07-08 (×13): 20 mg via ORAL
  Filled 2018-06-26 (×13): qty 1

## 2018-06-26 MED ORDER — CLINDAMYCIN PHOSPHATE 300 MG/50ML IV SOLN
300.0000 mg | Freq: Three times a day (TID) | INTRAVENOUS | Status: DC
  Administered 2018-06-26: 300 mg via INTRAVENOUS
  Filled 2018-06-26: qty 50

## 2018-06-26 MED ORDER — LIVING WELL WITH DIABETES BOOK
Freq: Once | Status: AC
  Administered 2018-06-26: 16:00:00
  Filled 2018-06-26: qty 1

## 2018-06-26 MED ORDER — IPRATROPIUM-ALBUTEROL 0.5-2.5 (3) MG/3ML IN SOLN: 3.0000 mL | Freq: Four times a day (QID) | RESPIRATORY_TRACT | Status: DC | PRN

## 2018-06-26 MED ORDER — CHLORHEXIDINE GLUCONATE 0.12 % MT SOLN
15.0000 mL | Freq: Two times a day (BID) | OROMUCOSAL | Status: DC
  Administered 2018-06-26 – 2018-07-08 (×25): 15 mL via OROMUCOSAL
  Filled 2018-06-26 (×25): qty 15

## 2018-06-26 MED ORDER — METHOCARBAMOL 500 MG PO TABS
500.0000 mg | ORAL_TABLET | Freq: Four times a day (QID) | ORAL | Status: DC | PRN
  Filled 2018-06-26: qty 1

## 2018-06-26 MED ORDER — INSULIN ASPART 100 UNIT/ML ~~LOC~~ SOLN
0.0000 [IU] | Freq: Every day | SUBCUTANEOUS | Status: DC
  Administered 2018-06-27: 2 [IU] via SUBCUTANEOUS
  Administered 2018-06-29: 4 [IU] via SUBCUTANEOUS
  Administered 2018-06-30: 2 [IU] via SUBCUTANEOUS
  Administered 2018-07-02: 3 [IU] via SUBCUTANEOUS
  Administered 2018-07-03: 2 [IU] via SUBCUTANEOUS
  Administered 2018-07-04: 3 [IU] via SUBCUTANEOUS

## 2018-06-26 MED ORDER — HYDRALAZINE HCL 20 MG/ML IJ SOLN
10.0000 mg | INTRAMUSCULAR | Status: DC | PRN
  Administered 2018-06-26 – 2018-06-30 (×2): 10 mg via INTRAVENOUS
  Filled 2018-06-26 (×2): qty 1

## 2018-06-26 MED ORDER — JUVEN PO PACK
1.0000 | PACK | Freq: Two times a day (BID) | ORAL | Status: DC
  Administered 2018-06-26 – 2018-07-08 (×20): 1 via ORAL
  Filled 2018-06-26 (×26): qty 1

## 2018-06-26 MED ORDER — OXYCODONE HCL 5 MG PO TABS
5.0000 mg | ORAL_TABLET | ORAL | Status: DC | PRN
  Administered 2018-06-28 – 2018-07-05 (×6): 5 mg via ORAL
  Filled 2018-06-26 (×10): qty 1

## 2018-06-26 MED ORDER — CLINDAMYCIN PHOSPHATE 600 MG/50ML IV SOLN
600.0000 mg | Freq: Three times a day (TID) | INTRAVENOUS | Status: DC
  Administered 2018-06-26 – 2018-06-28 (×6): 600 mg via INTRAVENOUS
  Filled 2018-06-26 (×7): qty 50

## 2018-06-26 MED ORDER — INSULIN GLARGINE 100 UNIT/ML ~~LOC~~ SOLN
25.0000 [IU] | Freq: Every day | SUBCUTANEOUS | Status: DC
  Administered 2018-06-26 – 2018-07-01 (×6): 25 [IU] via SUBCUTANEOUS
  Filled 2018-06-26 (×7): qty 0.25

## 2018-06-26 MED ORDER — ACETAMINOPHEN 325 MG PO TABS
650.0000 mg | ORAL_TABLET | Freq: Four times a day (QID) | ORAL | Status: DC | PRN
  Administered 2018-06-26: 650 mg via ORAL
  Filled 2018-06-26: qty 2

## 2018-06-26 NOTE — Progress Notes (Signed)
Initial Nutrition Assessment  DOCUMENTATION CODES:   Obesity unspecified  INTERVENTION:  - Will order Ensure Max once/day, each supplement provides 150 kcal and 30 grams of protein. - Will order Juven BID, each packet provides 80 calories, 8 grams of carbohydrate, and 14 grams of amino acids; supplement contains CaHMB, glutamine, and arginine, to promote wound healing. - Will order daily multivitamin with minerals. - Continue to encourage PO intakes.    NUTRITION DIAGNOSIS:   Increased nutrient needs related to wound healing, acute illness, post-op healing as evidenced by estimated needs.  GOAL:   Patient will meet greater than or equal to 90% of their needs  MONITOR:   PO intake, Supplement acceptance, Weight trends, Labs, Skin  REASON FOR ASSESSMENT:   Other (Comment)(pressure injury report)  ASSESSMENT:   73 y.o. female with medical history of anemia, arthritis, asthma, COPD, OSA on CPAP, CAD, uncontrolled DM, HLD, MI, RLS, and medical noncompliance. She presented to the ED for evaluation of frequent falls and a boil on her gluteal area. She denied any injuries from falls but has been unable to get up after falling.  She reported generalized weakness. She also reported a boil on her backside for a few days that is has progressively worsened and was severe the night PTA.  No intakes documented since admission. Patient reports eating a few bites of breakfast and drinking a cup of coffee. She denies abdominal pain or nausea today or PTA. She denies any chewing or swallowing issues. She reports good appetite PTA but then states that she often does not eat much at baseline. She often eats fish or clam chowder from [facility] or [place] food.  Talked with patient about the importance of protein for wound healing. Encouraged increased PO intakes throughout each day and to have protein each time she eats. Patient expresses understanding.   She does not drink protein shakes or ONS such  as Ensure or Boost at home and has not tried any items like this in the past. Will trial Ensure Max once/day.   Per chart review, current weight is 204 lb and weight on 10/05/17 was 224 lb. This indicates 20 lb weight loss (9% body weight) in the past 8 months; not significant for time frame.  Per Brooke's note this AM: patient with Fournier's gangrene and is POD #1 I&D perirectal a abscess, plan to return to OR 2/27 for further debridement and dressing change, sepsis.   Medications reviewed; 25 units lantus/day. Labs reviewed; CBG: 126 mg/dl today, K: 3.4 mmol/l, Ca: 8.3 mg/dl. IVF; NS @ 75 ml/hr.      NUTRITION - FOCUSED PHYSICAL EXAM:  Completed to upper body only; no muscle or fat wasting.   Diet Order:   Diet Order            Diet NPO time specified  Diet effective midnight        Diet Carb Modified Fluid consistency: Thin; Room service appropriate? Yes  Diet effective now              EDUCATION NEEDS:   Education needs have been addressed  Skin:  Skin Assessment: Skin Integrity Issues: Skin Integrity Issues:: Stage II Stage II: sacrum x2, R groin  Last BM:  PTA/unknown  Height:   Ht Readings from Last 1 Encounters:  06/26/18 5\' 3"  (1.6 m)    Weight:   Wt Readings from Last 1 Encounters:  06/25/18 92.8 kg    Ideal Body Weight:  52.27 kg  BMI:  Body mass index is 36.24 kg/m.  Estimated Nutritional Needs:   Kcal:  1855-2045 kcal  Protein:  80-90 grams  Fluid:  >/= 1.8 L/day     Trenton Gammon, MS, RD, LDN, [facility] Inpatient Clinical Dietitian Pager # [phone] After hours/weekend pager # [phone]

## 2018-06-26 NOTE — Progress Notes (Signed)
Patient continues to decline and deny nocturnal CPAP use. Order changed to prn per RT protocol.

## 2018-06-26 NOTE — Progress Notes (Addendum)
PROGRESS NOTE  Courtney [name]  ZOX:[id number] DOB: [birth year] DOA: 06/25/2018 PCP: Jordan Hawks, PA-C   Brief Narrative: Courtney [name] is a 73 y.o. female with a history of uncontrolled IDT2DM, CAD, OSA on CPAP, asthma, COPD who presented to the ED with her husband for weakness and gluteal abscess for several days. She was found to be septic with fournier gangrene and taken emergently to the OR. Stable enough for placement in the stepdown unit, hospitalists were called for admission.  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  - Continue broad coverage, currently CTX, vancomycin as well as clindamycin for antitoxin properties. - Follow up blood cultures and intraoperative cultures to narrow. Thus far abundant GNR, GPC on wound culture with E. coli. - To OR 2/27 per surgery - Wound care per surgery  Stage II pressure injuries of right and left medial sacrum and right groin, POA:  - Wound care per surgery, offloading as able. - Optimize nutritional status  Uncontrolled IDT2DM with hyperglycemia: HbA1c 12.1%. Last seen by Dr. Shawnee Knapp, endocrinology with [facility] Aug 2019 - Will need tight control to improve chances at wound healing. Currently at inpatient goal.  - Continue lantus 25u daily and add correction, titrate as needed. Would not hold this for surgery. - Diabetes coordinator consulted  COPD, asthma: H&P states she's noncompliant with home oxygen. - Continue supplemental oxygen as needed for saturation 88-95% to avoid retention - ?No home medications. Not currently wheezing, will add prn's.  OSA:  - CPAP qHS  CAD:  - Continue metoprolol, statin, not on ASA which should be  considered.  HTN:  - Holding lisinopril (pt reports not taking anyway) - Continuing metoprolol, BP up some, will add prn hydralazine.   Hyperlipidemia:  - Statin  Obesity: BMI 36. Increases insulin resistance. - Dietitian consult  DVT prophylaxis: Heparin [place] Code Status: Full, confirmed today Family Communication: None at bedside Disposition Plan: Uncertain. Guarded prognosis.  Consultants:   General surgery  Procedures:  06/25/18 IRRIGATION AND DEBRIDEMENT PERIRECTAL ABCESS Romie Levee, MD   Antimicrobials:  Ceftriaxone, vancomycin, clindamycin 2/25 >>    Subjective: Resting quietly, denies pain or other complaints. Has no shortness of breath, chest pain, palpitations.   Objective: Vitals:   06/26/18 1000 06/26/18 1013 06/26/18 1100 06/26/18 1200  BP: (!) 155/95  (!) 154/116 (!) 148/59  Pulse:    99  Resp:      Temp:      TempSrc:      SpO2: 94%   98%  Weight:      Height:   (1.6 m)      Intake/Output Summary (Last 24 hours) at 06/26/2018 1256 Last data filed at 06/26/2018 1000 Gross per 24 hour  Intake 1442.51 ml  Output 1175 ml  Net 267.51 ml   Filed Weights   06/25/18 2034  Weight: 92.8 kg    Gen: 73 y.o. female in no distress  Pulm: Non-labored breathing supplemental oxygen. Distant without wheezing.  CV: Regular tachycardia. No murmur, rub, or gallop. No JVD, trace pedal edema. GI: Abdomen soft, non-tender, non-distended, with normoactive bowel sounds. No organomegaly or masses felt. Ext: Warm, no deformities Skin: Right groin  pressure dressing in place, perirectal abscess/packing not investigated today. Neuro: Alert, oriented. No focal neurological deficits. Psych: Judgement and insight appear limited-to-fair. Mood & affect appropriate.   Data Reviewed: I have personally reviewed following labs and imaging studies  CBC: Recent Labs  Lab 06/25/18 1209 06/25/18 2230 06/26/18 0745  WBC 39.2* 42.3* 38.6*  NEUTROABS 34.2* 40.2*  --     HGB 13.5 11.7* 11.1*  HCT 46.0 39.0 36.6  MCV 83.9 82.8 81.3  PLT 365 217 278   Basic Metabolic Panel: Recent Labs  Lab 06/25/18 1209 06/25/18 1947 06/26/18 0745  NA 135 137 135  K 2.8* 2.5* 3.4*  CL 93* 105 102  CO2 GLUCOSE 257* 71 100*  BUN 25* 23 21  CREATININE 1.05* 0.59 0.68  CALCIUM 9.1 7.5* 8.3*   GFR: Estimated Creatinine Clearance: 66.8 mL/min (by C-G formula based on SCr of 0.68 mg/dL). Liver Function Tests: Recent Labs  Lab 06/25/18 1947  AST 44*  ALT 50*  ALKPHOS 147*  BILITOT 0.4  PROT 5.3*  ALBUMIN 1.9*   No results for input(s): LIPASE, AMYLASE in the last 168 hours. No results for input(s): AMMONIA in the last 168 hours. Coagulation Profile: Recent Labs  Lab 06/25/18 1859 06/25/18 1947  INR 1.2 1.2   Cardiac Enzymes: Recent Labs  Lab 06/25/18 1209  TROPONINI 0.03*   BNP (last 3 results) No results for input(s): PROBNP in the last 8760 hours. HbA1C: Recent Labs    06/25/18 1208 06/26/18 0745  HGBA1C 11.9* 12.1*   CBG: Recent Labs  Lab 06/25/18 1624 06/25/18 1805 06/25/18 1842 06/25/18 1917 06/26/18 1128  GLUCAP 242* 259* 248* 205* 126*   Lipid Profile: Recent Labs    06/26/18 0745  CHOL 82  HDL 13*  LDLCALC 54  TRIG 75  CHOLHDL 6.3   Thyroid Function Tests: No results for input(s): TSH, T4TOTAL, FREET4, T3FREE, THYROIDAB in the last 72 hours. Anemia Panel: No results for input(s): VITAMINB12, FOLATE, FERRITIN, TIBC, IRON, RETICCTPCT in the last 72 hours. Urine analysis:    Component Value Date/Time   COLORURINE AMBER BIOCHEMICALS MAY BE AFFECTED BY COLOR (A) 02/11/2010 1320   APPEARANCEUR CLOUDY (A) 02/11/2010 1320   LABSPEC 1.025 02/11/2010 1320   PHURINE 5.5 02/11/2010 1320   GLUCOSEU NEGATIVE 02/11/2010 1320   HGBUR NEGATIVE 02/11/2010 1320   BILIRUBINUR SMALL (A) 02/11/2010 1320   KETONESUR TRACE (A) 02/11/2010 1320   PROTEINUR 30 (A) 02/11/2010 1320   UROBILINOGEN 1.0 02/11/2010 1320    NITRITE NEGATIVE 02/11/2010 1320   LEUKOCYTESUR MODERATE (A) 02/11/2010 1320   Recent Results (from the past 240 hour(s))  Culture, blood (routine x 2)     Status: None (Preliminary result)   Collection Time: 06/25/18 12:10 PM  Result Value Ref Range Status   Specimen Description   Final    BLOOD LEFT ANTECUBITAL Performed at [facility], [address], [place], [place] [address]    Special Requests   Final    BOTTLES DRAWN AEROBIC AND ANAEROBIC Blood Culture adequate volume Performed at [facility], [address] [place], [place] [address]    Culture   Final    NO GROWTH < 24 HOURS Performed at [facility], [address] [place], [place] [address]    Report Status PENDING  Incomplete  Culture, blood (routine x 2)     Status: None (Preliminary result)   Collection Time: 06/25/18  2:06 PM  Result Value Ref  Range Status   Specimen Description   Final    BLOOD LEFT ANTECUBITAL Performed at [facility], [address] [place], [place] [address]    Special Requests   Final    BOTTLES DRAWN AEROBIC AND ANAEROBIC Blood Culture adequate volume Performed at [facility], [address] [place], [place] [address]    Culture   Final    NO GROWTH < 24 HOURS Performed at [facility], [address], [place], [place] [address]    Report Status PENDING  Incomplete  Aerobic/Anaerobic Culture (surgical/deep wound)     Status: None (Preliminary result)   Collection Time: 06/25/18  5:18 PM  Result Value Ref Range Status   Specimen Description   Final    ABSCESS Performed at [facility], [address], [place], [place] [address]    Special Requests RECTAL  Final   Gram Stain   Final    FEW WBC PRESENT, PREDOMINANTLY PMN ABUNDANT GRAM NEGATIVE RODS ABUNDANT GRAM POSITIVE COCCI    Culture   Final    RARE GRAM NEGATIVE RODS IDENTIFICATION AND SUSCEPTIBILITIES TO  FOLLOW Performed at [facility], [address] [place], [place] [address]    Report Status PENDING  Incomplete  Culture, blood (x 2)     Status: None (Preliminary result)   Collection Time: 06/25/18  7:52 PM  Result Value Ref Range Status   Specimen Description   Final    BLOOD ARTERIAL LINE Performed at [facility], [address], [place], [place] [address]    Special Requests   Final    BOTTLES DRAWN AEROBIC AND ANAEROBIC Blood Culture adequate volume Performed at [facility], [address], [place], [place] [address]    Culture   Final    NO GROWTH < 12 HOURS Performed at [facility], [address] [place], [place] [address]    Report Status PENDING  Incomplete  Culture, blood (x 2)     Status: None (Preliminary result)   Collection Time: 06/25/18  9:34 PM  Result Value Ref Range Status   Specimen Description BLOOD LEFT ARM  Final   Special Requests   Final    BOTTLES DRAWN AEROBIC ONLY Blood Culture results may not be optimal due to an inadequate volume of blood received in culture bottles   Culture   Final    NO GROWTH < 12 HOURS Performed at [facility], [address], [place], [place] [address]    Report Status PENDING  Incomplete  MRSA PCR Screening     Status: None   Collection Time: 06/25/18 10:19 PM  Result Value Ref Range Status   MRSA by PCR NEGATIVE NEGATIVE Final    Comment:        The GeneXpert MRSA Assay (FDA approved for NASAL specimens only), is one component of a comprehensive MRSA colonization surveillance program. It is not intended to diagnose MRSA infection nor to guide or monitor treatment for MRSA infections. Performed at [facility], [address], [place], [place] [address]       Radiology Studies: X-ray Chest Pa Or Ap  Result Date: 06/25/2018 CLINICAL DATA:  Central line placement EXAM: CHEST  1 VIEW COMPARISON:  02/11/2010 FINDINGS: Right internal  jugular central line is been placed with the tip in the lower SVC. No pneumothorax. Heart is mildly enlarged with perihilar and lower lobe opacities and diffuse interstitial  prominence, likely mild edema/CHF. No effusions or acute bony abnormality. IMPRESSION: Right central line tip in the SVC.  No pneumothorax. Mild edema/CHF. Electronically Signed   By: Charlett Nose M.D.   On: 06/25/2018 18:38   Ct Abdomen Pelvis W Contrast  Result Date: 06/25/2018 CLINICAL DATA:  73 year old female with ball on buttock noticed yesterday. Initial encounter. EXAM: CT ABDOMEN AND PELVIS WITH CONTRAST TECHNIQUE: Multidetector CT imaging of the abdomen and pelvis was performed using the standard protocol following bolus administration of intravenous contrast. CONTRAST:  ISOVUE-300 IOPAMIDOL (ISOVUE-300) INJECTION 61% COMPARISON:  None. FINDINGS: Lower chest: Minimal basilar atelectasis. Mild cardiomegaly. Coronary artery calcifications. Hepatobiliary: No worrisome hepatic lesion. Suspect fatty infiltration. Post cholecystectomy. Pancreas: No worrisome pancreatic mass or inflammation. Spleen: Subcentimeter area of enhancement dome of spleen of indeterminate etiology or significance. Spleen not enlarged. Adrenals/Urinary Tract: No obstructing stone or hydronephrosis. Several renal low-density structures too small to characterize although statistically likely cysts. Hyperplasia adrenal glands. Prominent size urinary bladder. Noncontrast filled imaging without acute abnormality. Stomach/Bowel: Besides necrotizing fasciitis impressing upon the right lateral aspect of the rectum, no primary bowel inflammatory process is noted. Appendix not clearly delineated. Portions of the stomach, small bowel and colon are under distended limiting evaluation. Vascular/Lymphatic: Atherosclerotic changes aorta and aortic branch vessels. No abdominal aortic aneurysm or large vessel occlusion. Inguinal adenopathy greater on right. Slightly  prominent size right external iliac lymph node. Reproductive: Post hysterectomy.  No worrisome adnexal mass. 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 Soft tissue gas and inflammatory process extends from the upper right thigh/right buttock region anteriorly and superiorly impressing upon the right lateral aspect of the rectum and lower cervix. Anterior extension into the mons region greater on the right but also extending into the left mons. Gas and inflammatory soft tissue process than extends anterior to the pubic symphysis and anterior to the right femoral region (immediately adjacent to femoral vessels) and along the anterior and lateral aspect of the right pelvic and right lower abdominal wall. The rectum is immediately adjacent to this inflammatory process and displaced slightly to the left. No clear communication with the rectum identified. Lateral to the upper right femur is a 1.3 cm subcutaneous lesion possibly a sebaceous cyst. If no free intraperitoneal air or drainable fluid collection. No bowel containing hernia. Small fat containing hernia periumbilical region. Musculoskeletal: Degenerative changes lower thoracic and lumbar spine most notable L4-5 and L5-S1 level. Mild hip joint degenerative changes and sacroiliac joint degenerative changes. IMPRESSION: 1. 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 Soft tissue gas and inflammatory process extends from the upper right thigh/right buttock region anteriorly and superiorly impressing upon the right lateral aspect of the rectum and lower cervix. Anterior extension into the mons region greater on the right but also extending into the left mons. Gas and inflammatory soft tissue process than extends anterior to the pubic symphysis and anterior to the right femoral region (immediately adjacent to femoral vessels) and along the anterior and lateral aspect of the right pelvic and right lower  abdominal wall. 2. The rectum is immediately adjacent to this inflammatory process and displaced slightly to the left. No clear communication with the rectum identified. 3. Inguinal adenopathy greater on the right. 4. Lateral to the upper right femur is a 1.3 cm subcutaneous lesion possibly a sebaceous cyst. 5. Subcentimeter hyperdensity dome of the spleen of indeterminate etiology/significance. 6. Scattered small low-density renal lesions too small to characterize statistically likely cysts. 7. Suspect fatty infiltration liver. 8.  Post cholecystectomy. 9. Appendix not visualized. 10.  Aortic Atherosclerosis (ICD10-I70.0). 11. Mild cardiomegaly with coronary artery calcifications. 12. Degenerative changes lower thoracic and lumbar spine. These results were called by telephone at the time of interpretation on 06/25/2018 at 3:22 pm to Dr. Meridee Score , who verbally acknowledged these results. Electronically Signed   By: Lacy Duverney M.D.   On: 06/25/2018 15:51    Scheduled Meds: . chlorhexidine  15 mL Mouth Rinse BID  . heparin injection (subcutaneous)  5,000 Units Subcutaneous Q8H  . insulin glargine  25 Units Subcutaneous Daily  . mouth rinse  15 mL Mouth Rinse q12n4p  . metoprolol succinate  25 mg Oral Daily  . multivitamin with minerals  1 tablet Oral Daily  . nutrition supplement (JUVEN)  1 packet Oral BID BM  . ENSURE MAX PROTEIN  11 oz Oral Daily  . rosuvastatin  20 mg Oral Daily   Continuous Infusions: . sodium chloride 75 mL/hr at 06/25/18 2200  . cefTRIAXone (ROCEPHIN)  IV Stopped (06/25/18 1611)  . clindamycin (CLEOCIN) IV 600 mg (06/26/18 1217)  . lactated ringers 1,000 mL with potassium chloride 20 mEq infusion 100 mL/hr at 06/26/18 1041  . vancomycin       LOS: 1 day   Time spent: 35 minutes.  Tyrone Nine, MD [facility] [email or web address] Password [facility] 06/26/2018, 12:56 PM

## 2018-06-26 NOTE — Progress Notes (Signed)
Pharmacy Antibiotic Note  Courtney [name] is a 73 y.o. female admitted on 06/25/2018 with cellulitis.  Pharmacy has been consulted for vancomycin dosing.  Plan: Vancomycin 1gm x1 @1506  and 1gm x1 @2315 , then Vancomycin 1500 mg IV Q 24 hrs. Goal AUC 400-550. Expected AUC: 500 SCr used: 0.8 (adjusted) Ceftiaxone 2gm iv 24hr Clindamycin 300mg  iv q8hr  Weight: 204 lb 9.4 oz (92.8 kg)  Temp (24hrs), Avg:97.9 F (36.6 C), Min:97.6 F (36.4 C), Max:98.3 F (36.8 C)  Recent Labs  Lab 06/25/18 1209 06/25/18 1211 06/25/18 1554 06/25/18 1947 06/25/18 2230 06/26/18 0100  WBC 39.2*  --   --   --  42.3*  --   CREATININE 1.05*  --   --  0.59  --   --   LATICACIDVEN  --  5.0* 3.2* 2.7*  --  1.5    CrCl cannot be calculated (Unknown ideal weight.).    Allergies  Allergen Reactions  . Eggs Or Egg-Derived Products   . Penicillins     Did it involve swelling of the face/tongue/throat, SOB, or low BP? No Did it involve sudden or severe rash/hives, skin peeling, or any reaction on the inside of your mouth or nose? Yes Did you need to seek medical attention at a hospital or doctor's office? yes When did it last happen?more than 10 years If all above answers are "NO", may proceed with cephalosporin use.     Antimicrobials this admission: Vancomycin 06/25/2018 >> Clindamycin 06/25/2018 >>  Ceftriaxone 06/25/2018 >>   Dose adjustments this admission: -  Microbiology results: -  Thank you for allowing pharmacy to be a part of this patient's care.  Aleene Davidson Crowford 06/26/2018 4:46 AM

## 2018-06-26 NOTE — Progress Notes (Signed)
I&O cath completed 1175cc urine out.

## 2018-06-26 NOTE — Progress Notes (Signed)
Inpatient Diabetes Program Recommendations  AACE/ADA: New Consensus Statement on Inpatient Glycemic Control (2015)  Target Ranges:  Prepandial:   less than 140 mg/dL      Peak postprandial:   less than 180 mg/dL (1-2 hours)      Critically ill patients:  140 - 180 mg/dL   Lab Results  Component Value Date   GLUCAP 126 (H) 06/26/2018   HGBA1C 12.1 (H) 06/26/2018    Review of Glycemic Control  Diabetes history: DM 2 Outpatient Diabetes medications: Lantus 25 units Current orders for Inpatient glycemic control: Lantus 25 units  Inpatient Diabetes Program Recommendations:   Noted patient had surgery today and planned to do further debridement tomorrow. -Novolog sensitive correction tid + hs 0-5 units  Will plan to speak with patient regarding elevated A1c when appropriate.  Thank you, Billy Fischer. Cassidie Veiga, RN, MSN, CDE  Diabetes Coordinator Inpatient Glycemic Control Team Team Pager [phone] (8am-5pm) 06/26/2018 11:48 AM

## 2018-06-26 NOTE — Evaluation (Signed)
Physical Therapy Evaluation Patient Details Name: Courtney [name] MRN: [id number] DOB: [birth year] Today's Date: 06/26/2018   History of Present Illness  73 y.o. female admitted on 06/25/18 for boil on her left buttock with multiple recent falls.  Pt s/p I and D of fornier's gangrene in perirectal abcess 06/25/18 and scheduled again on 06/27/18.  Pt with significant PMH of RLS, MI, HTN, DM, COPD, CAD, and arthritis.    Clinical Impression  Pt was able to come to EOB and stand momentarily with two person assist.  She has fallen 3 times in the past week and is very limited by pain in her wound.  I am not sure how much sitting on her bottom the surgery team is going to want her to do (ie OOB to chair), but she would benefit from frequent mobility as she was ambulatory with assist and RW household distances PTA.   PT to follow acutely for deficits listed below.  Pt may need post acute rehab at discharge depending on progress.     Follow Up Recommendations SNF    Equipment Recommendations  Wheelchair (measurements PT);Wheelchair cushion (measurements PT);Hospital bed;Other (comment)(with air mattress overlay)    Recommendations for Other Services   NA    Precautions / Restrictions Precautions Precautions: Fall Precaution Comments: recent h/o 3 falls (once in the ED and two at home).       Mobility  Bed Mobility Overal bed mobility: Needs Assistance Bed Mobility: Rolling;Sidelying to Sit;Sit to Supine Rolling: Mod assist Sidelying to sit: +2 for physical assistance;Mod assist   Sit to supine: Mod assist   General bed mobility comments: Mod assist to roll to the left and come up to sitting EOB with support sparately to bring legs over and then to boost trunk up to sitting.  Husband helping with transition to sit up.  Mod assist to lift both legs back up in the bed.  Pt reliant on bedrails for transitions.  Max assist x 2 to scoot to [facility] with bed in trendelenberg.    Transfers Overall  transfer level: Needs assistance Equipment used: 2 person hand held assist Transfers: Sit to/from Stand Sit to Stand: +2 physical assistance;Mod assist         General transfer comment: Two person mod assist to stand EOB with two person hand held assist over weak and skakey legs.          Balance Overall balance assessment: Needs assistance Sitting-balance support: Feet supported;Bilateral upper extremity supported Sitting balance-Leahy Scale: Poor Sitting balance - Comments: Min assist EOB, pt fearful of falling   Standing balance support: Bilateral upper extremity supported Standing balance-Leahy Scale: Poor Standing balance comment: two person hand held assist to stand EOB, pt unable to side step up higher in the bed at this time.                              Pertinent Vitals/Pain Pain Assessment: Faces Faces Pain Scale: Hurts whole lot Pain Location: buttocks Pain Descriptors / Indicators: Guarding;Grimacing Pain Intervention(s): Limited activity within patient's tolerance;Monitored during session;Repositioned    Home Living Family/patient expects to be discharged to:: Private residence Living Arrangements: Spouse/significant other Available Help at Discharge: Family;Available 24 hours/day(and he is physically able to help her) Type of Home: House Home Access: Stairs to enter   [employer] of Steps: 2 Home Layout: One level Home Equipment: Walker - 2 wheels      Prior Function Level  of Independence: Needs assistance   Gait / Transfers Assistance Needed: min guard to supervision for household gait with RW.  Husband drives her to her appointments.            Hand Dominance   Dominant Hand: Right    Extremity/Trunk Assessment   Upper Extremity Assessment Upper Extremity Assessment: Generalized weakness    Lower Extremity Assessment Lower Extremity Assessment: Generalized weakness    Cervical / Trunk Assessment Cervical /  Trunk Assessment: Normal  Communication   Communication: No difficulties  Cognition Arousal/Alertness: Awake/alert Behavior During Therapy: WFL for tasks assessed/performed Overall Cognitive Status: Within Functional Limits for tasks assessed                                        General Comments General comments (skin integrity, edema, etc.): VSS throughout.  Foul odor from wound present.         Assessment/Plan    PT Assessment Patient needs continued PT services  PT Problem List Decreased strength;Decreased activity tolerance;Decreased balance;Decreased mobility;Decreased knowledge of use of DME;Obesity;Pain;Decreased skin integrity       PT Treatment Interventions DME instruction;Gait training;Stair training;Functional mobility training;Therapeutic activities;Therapeutic exercise;Balance training;Patient/family education    PT Goals (Current goals can be found in the Care Plan section)  Acute Rehab PT Goals Patient Stated Goal: to decrease pain in her bottom and stop falling PT Goal Formulation: With patient/family Time For Goal Achievement: 07/10/18 Potential to Achieve Goals: Good    Frequency Min 3X/week           AM-PAC PT "6 Clicks" Mobility  Outcome Measure Help needed turning from your back to your side while in a flat bed without using bedrails?: A Lot Help needed moving from lying on your back to sitting on the side of a flat bed without using bedrails?: A Lot Help needed moving to and from a bed to a chair (including a wheelchair)?: A Lot Help needed standing up from a chair using your arms (e.g., wheelchair or bedside chair)?: A Lot Help needed to walk in hospital room?: Total Help needed climbing 3-5 steps with a railing? : Total 6 Click Score: 10    End of Session   Activity Tolerance: Patient limited by fatigue;Patient limited by pain Patient left: in bed;with call bell/phone within reach;with family/visitor present Nurse  Communication: Mobility status PT Visit Diagnosis: Muscle weakness (generalized) (M62.81);Difficulty in walking, not elsewhere classified (R26.2);Pain Pain - Right/Left: (central) Pain - part of body: (buttocks)    Time: 3007-6226 PT Time Calculation (min) (ACUTE ONLY): 27 min   Charges:         Lurena Joiner B. Tamecka Milham, PT, DPT  Acute Rehabilitation #[phone] pager #[phone] office   PT Evaluation $PT Eval Moderate Complexity: 1 Mod PT Treatments $Therapeutic Activity: 8-22 mins

## 2018-06-26 NOTE — Progress Notes (Signed)
[facility] Progress Note  1 Day Post-Op  Subjective: CC-  No complaints this AM. Slept well last night. Denies any current pain.  Unable to urinate over night. Bladder scan showing >700cc, about to I&O cath.  Objective: Vital signs in last 24 hours: Temp:  [97.6 F (36.4 C)-98.3 F (36.8 C)] 98.1 F (36.7 C) (02/26 0400) Pulse Rate:  [84-108] 100 (02/26 0700) Resp:  [15-33] 22 (02/26 0600) BP: (90-168)/(26-99) 114/62 (02/26 0700) SpO2:  [96 %-100 %] 96 % (02/26 0700) Weight:  [92.8 kg] 92.8 kg (02/25 2034)    Intake/Output from previous day: 02/25 0701 - 02/26 0700 In: 1175 [I.V.:1100; IV Piggyback:75] Out: 0  Intake/Output this shift: No intake/output data recorded.  PE: Gen:  Alert, NAD, pleasant HEENT: EOM's intact, pupils equal and round Card:  RRR Pulm:  Trace bilateral wheezing, no rhonchi, effort normal Abd: Soft, NT/ND GU: perineal dressing in place  Lab Results:  Recent Labs    06/25/18 2230 06/26/18 0745  WBC 42.3* 38.6*  HGB 11.7* 11.1*  HCT 39.0 36.6  PLT 217 278   BMET Recent Labs    06/25/18 1209 06/25/18 1947  NA 135 137  K 2.8* 2.5*  CL 93* 105  CO2 25 22  GLUCOSE 257* 71  BUN 25* 23  CREATININE 1.05* 0.59  CALCIUM 9.1 7.5*   PT/INR Recent Labs    06/25/18 1859 06/25/18 1947  LABPROT 15.4* 14.7  INR 1.2 1.2   CMP     Component Value Date/Time   NA 137 06/25/2018 1947   NA 142 10/02/2016 0856   NA 142 08/06/2014 0854   K 2.5 (LL) 06/25/2018 1947   K 4.8 08/06/2014 0854   CL 105 06/25/2018 1947   CL 103 01/26/2012 1242   CO2 22 06/25/2018 1947   CO2 29 08/06/2014 0854   GLUCOSE 71 06/25/2018 1947   GLUCOSE 372 (H) 08/06/2014 0854   GLUCOSE 122 (H) 01/26/2012 1242   BUN 23 06/25/2018 1947   BUN 11 10/02/2016 0856   BUN 12.1 08/06/2014 0854   CREATININE 0.59 06/25/2018 1947   CREATININE 1.1 08/06/2014 0854   CALCIUM 7.5 (L) 06/25/2018 1947   CALCIUM 10.6 (H) 08/06/2014 0854   PROT 5.3 (L) 06/25/2018  1947   PROT 6.7 10/02/2016 0856   PROT 6.9 08/06/2014 0854   ALBUMIN 1.9 (L) 06/25/2018 1947   ALBUMIN 4.1 10/02/2016 0856   ALBUMIN 3.6 08/06/2014 0854   AST 44 (H) 06/25/2018 1947   AST 12 08/06/2014 0854   ALT 50 (H) 06/25/2018 1947   ALT 15 08/06/2014 0854   ALKPHOS 147 (H) 06/25/2018 1947   ALKPHOS 140 08/06/2014 0854   BILITOT 0.4 06/25/2018 1947   BILITOT 0.2 10/02/2016 0856   BILITOT 0.29 08/06/2014 0854   GFRNONAA >60 06/25/2018 1947   GFRAA >60 06/25/2018 1947   Lipase  No results found for: LIPASE     Studies/Results: X-ray Chest Pa Or Ap  Result Date: 06/25/2018 CLINICAL DATA:  Central line placement EXAM: CHEST  1 VIEW COMPARISON:  02/11/2010 FINDINGS: Right internal jugular central line is been placed with the tip in the lower SVC. No pneumothorax. Heart is mildly enlarged with perihilar and lower lobe opacities and diffuse interstitial prominence, likely mild edema/CHF. No effusions or acute bony abnormality. IMPRESSION: Right central line tip in the SVC.  No pneumothorax. Mild edema/CHF. Electronically Signed   By: Charlett Nose M.D.   On: 06/25/2018 18:38   Ct Abdomen Pelvis W Contrast  Result Date: 06/25/2018 CLINICAL DATA:  73 year old female with ball on buttock noticed yesterday. Initial encounter. EXAM: CT ABDOMEN AND PELVIS WITH CONTRAST TECHNIQUE: Multidetector CT imaging of the abdomen and pelvis was performed using the standard protocol following bolus administration of intravenous contrast. CONTRAST:  ISOVUE-300 IOPAMIDOL (ISOVUE-300) INJECTION 61% COMPARISON:  None. FINDINGS: Lower chest: Minimal basilar atelectasis. Mild cardiomegaly. Coronary artery calcifications. Hepatobiliary: No worrisome hepatic lesion. Suspect fatty infiltration. Post cholecystectomy. Pancreas: No worrisome pancreatic mass or inflammation. Spleen: Subcentimeter area of enhancement dome of spleen of indeterminate etiology or significance. Spleen not enlarged. Adrenals/Urinary  Tract: No obstructing stone or hydronephrosis. Several renal low-density structures too small to characterize although statistically likely cysts. Hyperplasia adrenal glands. Prominent size urinary bladder. Noncontrast filled imaging without acute abnormality. Stomach/Bowel: Besides necrotizing fasciitis impressing upon the right lateral aspect of the rectum, no primary bowel inflammatory process is noted. Appendix not clearly delineated. Portions of the stomach, small bowel and colon are under distended limiting evaluation. Vascular/Lymphatic: Atherosclerotic changes aorta and aortic branch vessels. No abdominal aortic aneurysm or large vessel occlusion. Inguinal adenopathy greater on right. Slightly prominent size right external iliac lymph node. Reproductive: Post hysterectomy.  No worrisome adnexal mass. 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 Soft tissue gas and inflammatory process extends from the upper right thigh/right buttock region anteriorly and superiorly impressing upon the right lateral aspect of the rectum and lower cervix. Anterior extension into the mons region greater on the right but also extending into the left mons. Gas and inflammatory soft tissue process than extends anterior to the pubic symphysis and anterior to the right femoral region (immediately adjacent to femoral vessels) and along the anterior and lateral aspect of the right pelvic and right lower abdominal wall. The rectum is immediately adjacent to this inflammatory process and displaced slightly to the left. No clear communication with the rectum identified. Lateral to the upper right femur is a 1.3 cm subcutaneous lesion possibly a sebaceous cyst. If no free intraperitoneal air or drainable fluid collection. No bowel containing hernia. Small fat containing hernia periumbilical region. Musculoskeletal: Degenerative changes lower thoracic and lumbar spine most notable L4-5 and L5-S1 level.  Mild hip joint degenerative changes and sacroiliac joint degenerative changes. IMPRESSION: 1. 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 Soft tissue gas and inflammatory process extends from the upper right thigh/right buttock region anteriorly and superiorly impressing upon the right lateral aspect of the rectum and lower cervix. Anterior extension into the mons region greater on the right but also extending into the left mons. Gas and inflammatory soft tissue process than extends anterior to the pubic symphysis and anterior to the right femoral region (immediately adjacent to femoral vessels) and along the anterior and lateral aspect of the right pelvic and right lower abdominal wall. 2. The rectum is immediately adjacent to this inflammatory process and displaced slightly to the left. No clear communication with the rectum identified. 3. Inguinal adenopathy greater on the right. 4. Lateral to the upper right femur is a 1.3 cm subcutaneous lesion possibly a sebaceous cyst. 5. Subcentimeter hyperdensity dome of the spleen of indeterminate etiology/significance. 6. Scattered small low-density renal lesions too small to characterize statistically likely cysts. 7. Suspect fatty infiltration liver. 8. Post cholecystectomy. 9. Appendix not visualized. 10.  Aortic Atherosclerosis (ICD10-I70.0). 11. Mild cardiomegaly with coronary artery calcifications. 12. Degenerative changes lower thoracic and lumbar spine. These results were called by telephone at the time of interpretation on 06/25/2018 at 3:22 pm to Dr. Casimiro Needle  BUTLER , who verbally acknowledged these results. Electronically Signed   By: Lacy Duverney M.D.   On: 06/25/2018 15:51    Anti-infectives: Anti-infectives (From admission, onward)   Start     Dose/Rate Route Frequency Ordered Stop   06/26/18 2200  vancomycin (VANCOCIN) 1,500 mg in sodium chloride 0.9 % 500 mL IVPB     1,500 mg 250 mL/hr over 120 Minutes Intravenous Every  24 hours 06/26/18 0446     06/26/18 0600  clindamycin (CLEOCIN) IVPB 600 mg     600 mg 100 mL/hr over 30 Minutes Intravenous Every 8 hours 06/26/18 0508     06/26/18 0415  clindamycin (CLEOCIN) IVPB 300 mg  Status:  Discontinued     300 mg 100 mL/hr over 30 Minutes Intravenous Every 8 hours 06/26/18 0356 06/26/18 0508   06/26/18 0400  cefTRIAXone (ROCEPHIN) 2 g in sodium chloride 0.9 % 100 mL IVPB  Status:  Discontinued     2 g 200 mL/hr over 30 Minutes Intravenous Every 24 hours 06/26/18 0356 06/26/18 0409   06/25/18 2330  vancomycin (VANCOCIN) IVPB 1000 mg/200 mL premix     1,000 mg 200 mL/hr over 60 Minutes Intravenous  Once 06/25/18 1958 06/26/18 0015   06/25/18 1500  clindamycin (CLEOCIN) IVPB 600 mg     600 mg 100 mL/hr over 30 Minutes Intravenous  Once 06/25/18 1453 06/25/18 1657   06/25/18 1330  vancomycin (VANCOCIN) IVPB 1000 mg/200 mL premix     1,000 mg 200 mL/hr over 60 Minutes Intravenous  Once 06/25/18 1318 06/25/18 1606   06/25/18 1330  cefTRIAXone (ROCEPHIN) 2 g in sodium chloride 0.9 % 100 mL IVPB     2 g 200 mL/hr over 30 Minutes Intravenous Every 24 hours 06/25/18 1318         Assessment/Plan Uncontrolled DM HTN HLD CAD H/o STEMI 2011 s/p RCA stent COPD  Sepsis Fournier's Gangrene  S/p incision and drainage perirectal abscess 2/25 Dr. Maisie Fus - POD#1 - intraop cultures pending - BMP pending. Reinforce dressing PRN today for saturation, packing does not need to be changed today. Ok for diet, NPO after midnight. Plan to return to OR tomorrow for further debridement and dressing change.   ID - rocephin/clindamycin/vancomycin 2/25>>day#1 VTE - SCDs, sq heparin FEN - IVF, CM diet, NPO after midnight Foley - None, I&O cath this AM Follow up - TBD   LOS: 1 day    Courtney [name] , [facility] Surgery 06/26/2018, 8:20 AM Pager: [phone] Mon-Thurs 7:00 am-4:30 pm Fri 7:00 am -11:30 AM Sat-Sun 7:00 am-11:30 am

## 2018-06-27 ENCOUNTER — Encounter (HOSPITAL_COMMUNITY)
Admission: EM | Disposition: A | Discharge status: Other Acute Inpatient Hospital | Payer: Self-pay | Source: Home / Self Care | Attending: Internal Medicine

## 2018-06-27 ENCOUNTER — Inpatient Hospital Stay (HOSPITAL_COMMUNITY): Payer: Medicare Other | Admitting: Certified Registered Nurse Anesthetist

## 2018-06-27 ENCOUNTER — Encounter (HOSPITAL_COMMUNITY): Payer: Self-pay | Admitting: General Practice

## 2018-06-27 ENCOUNTER — Ambulatory Visit (HOSPITAL_COMMUNITY): Admission: RE | Admit: 2018-06-27 | Payer: Medicare Other | Source: Home / Self Care | Admitting: General Surgery

## 2018-06-27 HISTORY — PX: INCISION AND DRAINAGE ABSCESS: SHX5864

## 2018-06-27 LAB — BASIC METABOLIC PANEL
Anion gap: 8 (ref 5–15)
BUN: 19 mg/dL (ref 8–23)
CO2: 23 mmol/L (ref 22–32)
CREATININE: 0.63 mg/dL (ref 0.44–1.00)
Calcium: 8.4 mg/dL — ABNORMAL LOW (ref 8.9–10.3)
Chloride: 105 mmol/L (ref 98–111)
GFR calc Af Amer: >60 mL/min (ref >60)
GFR calc non Af Amer: >60 mL/min (ref >60)
Glucose, Bld: 225 mg/dL — ABNORMAL HIGH (ref 70–99)
Potassium: 3.6 mmol/L (ref 3.5–5.1)
Sodium: 136 mmol/L (ref 135–145)

## 2018-06-27 LAB — GLUCOSE, CAPILLARY
GLUCOSE-CAPILLARY: 169 mg/dL — AB (ref 70–99)
Glucose-Capillary: 137 mg/dL — ABNORMAL HIGH (ref 70–99)
Glucose-Capillary: 158 mg/dL — ABNORMAL HIGH (ref 70–99)
Glucose-Capillary: 173 mg/dL — ABNORMAL HIGH (ref 70–99)
Glucose-Capillary: 177 mg/dL — ABNORMAL HIGH (ref 70–99)
Glucose-Capillary: 185 mg/dL — ABNORMAL HIGH (ref 70–99)
Glucose-Capillary: 219 mg/dL — ABNORMAL HIGH (ref 70–99)

## 2018-06-27 LAB — CBC
HCT: 34 % — ABNORMAL LOW (ref 36.0–46.0)
Hemoglobin: 10.3 g/dL — ABNORMAL LOW (ref 12.0–15.0)
MCH: 24.6 pg — ABNORMAL LOW (ref 26.0–34.0)
MCHC: 30.3 g/dL (ref 30.0–36.0)
MCV: 81.3 fL (ref 80.0–100.0)
Platelets: 225 10*3/uL (ref 150–400)
RBC: 4.18 MIL/uL (ref 3.87–5.11)
RDW: 14.4 % (ref 11.5–15.5)
WBC: 25.7 10*3/uL — ABNORMAL HIGH (ref 4.0–10.5)
nRBC: 0 % (ref 0.0–0.2)

## 2018-06-27 LAB — URINE CULTURE
Culture: NO GROWTH
Special Requests: NORMAL

## 2018-06-27 SURGERY — INCISION AND DRAINAGE, ABSCESS
Anesthesia: General | Laterality: Right

## 2018-06-27 MED ORDER — PROPOFOL 10 MG/ML IV BOLUS
INTRAVENOUS | Status: DC | PRN
  Administered 2018-06-27: 100 mg via INTRAVENOUS
  Administered 2018-06-27: 50 mg via INTRAVENOUS

## 2018-06-27 MED ORDER — SODIUM CHLORIDE 0.9% FLUSH: 10.0000 mL | INTRAVENOUS | Status: DC | PRN

## 2018-06-27 MED ORDER — SODIUM CHLORIDE 0.9% FLUSH
10.0000 mL | Freq: Two times a day (BID) | INTRAVENOUS | Status: DC
  Administered 2018-06-27: 20 mL
  Administered 2018-06-27 – 2018-07-07 (×17): 10 mL

## 2018-06-27 MED ORDER — SUGAMMADEX SODIUM 200 MG/2ML IV SOLN
INTRAVENOUS | Status: DC | PRN
  Administered 2018-06-27: 200 mg via INTRAVENOUS

## 2018-06-27 MED ORDER — FENTANYL CITRATE (PF) 100 MCG/2ML IJ SOLN
INTRAMUSCULAR | Status: DC | PRN
  Administered 2018-06-27: 100 ug via INTRAVENOUS
  Administered 2018-06-27: 50 ug via INTRAVENOUS

## 2018-06-27 MED ORDER — CHLORHEXIDINE GLUCONATE CLOTH 2 % EX PADS
6.0000 | MEDICATED_PAD | Freq: Every day | CUTANEOUS | Status: DC
  Administered 2018-06-27: 6 via TOPICAL

## 2018-06-27 MED ORDER — LACTATED RINGERS IV SOLN
INTRAVENOUS | Status: DC
  Administered 2018-06-27: 13:00:00 via INTRAVENOUS

## 2018-06-27 MED ORDER — ONDANSETRON HCL 4 MG/2ML IJ SOLN
INTRAMUSCULAR | Status: DC | PRN
  Administered 2018-06-27: 4 mg via INTRAVENOUS

## 2018-06-27 MED ORDER — PHENYLEPHRINE 40 MCG/ML (10ML) SYRINGE FOR IV PUSH (FOR BLOOD PRESSURE SUPPORT)
PREFILLED_SYRINGE | INTRAVENOUS | Status: DC | PRN
  Administered 2018-06-27: 120 ug via INTRAVENOUS

## 2018-06-27 MED ORDER — 0.9 % SODIUM CHLORIDE (POUR BTL) OPTIME
TOPICAL | Status: DC | PRN
  Administered 2018-06-27: 1000 mL

## 2018-06-27 MED ORDER — ROCURONIUM BROMIDE 10 MG/ML (PF) SYRINGE
PREFILLED_SYRINGE | INTRAVENOUS | Status: DC | PRN
  Administered 2018-06-27: 40 mg via INTRAVENOUS

## 2018-06-27 SURGICAL SUPPLY — 25 items
BLADE SURG SZ10 CARB STEEL (BLADE) ×3 IMPLANT
BNDG GAUZE ELAST 4 BULKY (GAUZE/BANDAGES/DRESSINGS) ×3 IMPLANT
COVER WAND RF STERILE (DRAPES) IMPLANT
DECANTER SPIKE VIAL GLASS SM (MISCELLANEOUS) IMPLANT
DERMABOND ADVANCED (GAUZE/BANDAGES/DRESSINGS)
DERMABOND ADVANCED .7 DNX12 (GAUZE/BANDAGES/DRESSINGS) IMPLANT
DRAPE LAPAROTOMY TRNSV 102X78 (DRAPE) ×3 IMPLANT
ELECT PENCIL ROCKER SW 15FT (MISCELLANEOUS) ×3 IMPLANT
ELECT REM PT RETURN 15FT ADLT (MISCELLANEOUS) ×3 IMPLANT
GAUZE SPONGE 4X4 12PLY STRL (GAUZE/BANDAGES/DRESSINGS) IMPLANT
GLOVE BIOGEL PI IND STRL 7.0 (GLOVE) ×1 IMPLANT
GLOVE BIOGEL PI INDICATOR 7.0 (GLOVE) ×2
GOWN STRL REUS W/TWL 2XL LVL3 (GOWN DISPOSABLE) ×3 IMPLANT
GOWN STRL REUS W/TWL XL LVL3 (GOWN DISPOSABLE) ×3 IMPLANT
KIT BASIN OR (CUSTOM PROCEDURE TRAY) ×3 IMPLANT
NEEDLE HYPO 22GX1.5 SAFETY (NEEDLE) IMPLANT
PACK BASIC VI WITH GOWN DISP (CUSTOM PROCEDURE TRAY) ×3 IMPLANT
SPONGE LAP 18X18 RF (DISPOSABLE) ×3 IMPLANT
STAPLER VISISTAT 35W (STAPLE) IMPLANT
SUT VIC AB 3-0 SH 18 (SUTURE) IMPLANT
SUT VIC AB 4-0 PS2 18 (SUTURE) IMPLANT
SYR 20CC LL (SYRINGE) IMPLANT
TOWEL OR 17X26 10 PK STRL BLUE (TOWEL DISPOSABLE) ×3 IMPLANT
TOWEL OR NON WOVEN STRL DISP B (DISPOSABLE) ×3 IMPLANT
YANKAUER SUCT BULB TIP 10FT TU (MISCELLANEOUS) ×3 IMPLANT

## 2018-06-27 NOTE — Plan of Care (Signed)
  Problem: Coping: Goal: Level of anxiety will decrease Outcome: Progressing   Problem: Pain Managment: Goal: General experience of comfort will improve Outcome: Progressing   Problem: Safety: Goal: Ability to remain free from injury will improve Outcome: Progressing   

## 2018-06-27 NOTE — Evaluation (Signed)
Occupational Therapy Evaluation Patient Details Name: Courtney [name] MRN: [id number] DOB: Aug 30, 1945 Today's Date: 06/27/2018    History of Present Illness 73 y.o. female admitted on 06/25/18 for boil on her left buttock with multiple recent falls.  Pt s/p I and D of fornier's gangrene in perirectal abcess 06/25/18 and scheduled again on 06/27/18.  Pt with significant PMH of RLS, MI, HTN, DM, COPD, CAD, and arthritis.     Clinical Impression   Pt was admitted for the above. She reports that she got up on her own to use the bathroom and that husband assisted with LB adls. She has had several falls recently, including OOB.  Pt was able to sit EOB with mod/max +2 assistance but did not stand due to increased BP.  Pt is very fearful of movement. Will follow in acute setting with the goals listed below. Anticipate she will need continued OT at SNF following d/c.    Follow Up Recommendations  SNF    Equipment Recommendations  None recommended by OT    Recommendations for Other Services       Precautions / Restrictions Precautions Precautions: Fall Precaution Comments: recent h/o 3 falls (once in the ED and two at home).  Restrictions Weight Bearing Restrictions: No      Mobility Bed Mobility     Rolling: Mod assist   Supine to sit: Mod assist;Max assist;+2 for physical assistance;+2 for safety/equipment;HOB elevated Sit to supine: Max assist;+2 for physical assistance;+2 for safety/equipment   General bed mobility comments: HOB raised to get to EOB.  Assist for bil LEs, pt assisted with R and trunk  Transfers                 General transfer comment: not attempted due to increased BP    Balance     Sitting balance-Leahy Scale: Poor Sitting balance - Comments: pt needed UB support to sit and min guard +2.  Pt leaned to L and propped suddenly.                                   ADL either performed or assessed with clinical judgement   ADL Overall ADL's  : Needs assistance/impaired Eating/Feeding: NPO   Grooming: Min guard;Bed level(lines)   Upper Body Bathing: Minimal assistance(lines)   Lower Body Bathing: Total assistance;+2 for physical assistance   Upper Body Dressing : Moderate assistance;Bed level   Lower Body Dressing: Total assistance;+2 for physical assistance;Sit to/from stand                 General ADL Comments: pt sat EOB with +2 safety. BP increased so returned to supine.  Pt very fearful of movement:  rolling, sitting up etc.  Multimodal cues at times     Vision         Perception     Praxis      Pertinent Vitals/Pain Pain Assessment: No/denies pain Pain Score: (moaned but stated she was afraid of falling)     Hand Dominance     Extremity/Trunk Assessment Upper Extremity Assessment Upper Extremity Assessment: Generalized weakness;LUE deficits/detail LUE Deficits / Details: FF 3-/5           Communication Communication Communication: No difficulties   Cognition Arousal/Alertness: Awake/alert Behavior During Therapy: WFL for tasks assessed/performed  General Comments: mostly wfls.  Pt fearful and sometimes couldn't process cues   General Comments  BP 183/71 supine; 202/84 seated and 185/85 supine    Exercises     Shoulder Instructions      Home Living Family/patient expects to be discharged to:: Private residence Living Arrangements: Spouse/significant other Available Help at Discharge: Family;Available 24 hours/day                         Home Equipment: Walker - 2 wheels;Bedside commode   Additional Comments: pt sponge bathes      Prior Functioning/Environment Level of Independence: Needs assistance    ADL's / Homemaking Assistance Needed: husband assisted with adls as needed--mostly LB            OT Problem List: Decreased strength;Decreased activity tolerance;Impaired balance (sitting and/or  standing);Decreased safety awareness;Decreased knowledge of use of DME or AE;Pain      OT Treatment/Interventions: Self-care/ADL training;Therapeutic exercise;DME and/or AE instruction;Neuromuscular education;Therapeutic activities;Balance training;Patient/family education    OT Goals(Current goals can be found in the care plan section) Acute Rehab OT Goals Patient Stated Goal: stop falling OT Goal Formulation: With patient Time For Goal Achievement: 07/11/18 Potential to Achieve Goals: Good ADL Goals Pt Will Transfer to Toilet: with mod assist;with +2 assist;stand pivot transfer;bedside commode Additional ADL Goal #1: Pt will complete UB adl with set up Additional ADL Goal #2: pt will maintain static standing during adls with min A Additional ADL Goal #3: pt will perform RUE level one theraband strengthening program and LUE AROM program with supervision  OT Frequency: Min 2X/week   Barriers to D/C:            Co-evaluation              AM-PAC OT "6 Clicks" Daily Activity     Outcome Measure Help from another person eating meals?: A Little Help from another person taking care of personal grooming?: A Little Help from another person toileting, which includes using toliet, bedpan, or urinal?: A Lot Help from another person bathing (including washing, rinsing, drying)?: A Lot Help from another person to put on and taking off regular upper body clothing?: A Lot Help from another person to put on and taking off regular lower body clothing?: Total 6 Click Score: 13   End of Session Nurse Communication: (BP)  Activity Tolerance: Other (comment) Patient left: in bed;with call bell/phone within reach  OT Visit Diagnosis: History of falling (Z91.81);Muscle weakness (generalized) (M62.81)                Time: 9622-2979 OT Time Calculation (min): 22 min Charges:  OT General Charges $OT Visit: 1 Visit OT Evaluation $OT Eval Moderate Complexity: 1 Mod  Marica Otter,  OTR/L Acute Rehabilitation Services [phone] [facility] pager [phone] office 06/27/2018  Lira Stephen 06/27/2018, 1:10 PM

## 2018-06-27 NOTE — Anesthesia Procedure Notes (Signed)
Procedure Name: Intubation Date/Time: 06/27/2018 1:55 PM Performed by: Anne Fu, CRNA Pre-anesthesia Checklist: Patient identified, Emergency Drugs available, Suction available, Patient being monitored and Timeout performed Patient Re-evaluated:Patient Re-evaluated prior to induction Oxygen Delivery Method: Circle system utilized Preoxygenation: Pre-oxygenation with 100% oxygen Induction Type: IV induction Ventilation: Mask ventilation without difficulty Laryngoscope Size: Mac and 4 Grade View: Grade I Tube type: Oral Tube size: 7.5 mm Number of attempts: 1 Airway Equipment and Method: Stylet Placement Confirmation: ETT inserted through vocal cords under direct vision,  positive ETCO2 and breath sounds checked- equal and bilateral Secured at: 21 cm Tube secured with: Tape Dental Injury: Teeth and Oropharynx as per pre-operative assessment

## 2018-06-27 NOTE — Anesthesia Postprocedure Evaluation (Signed)
Anesthesia Post Note  Patient: Courtney [name]  Procedure(s) Performed: 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. (Right )     Patient location during evaluation: PACU Anesthesia Type: General Level of consciousness: awake and alert Pain management: pain level controlled Vital Signs Assessment: post-procedure vital signs reviewed and stable Respiratory status: spontaneous breathing, nonlabored ventilation and respiratory function stable Cardiovascular status: blood pressure returned to baseline and stable Postop Assessment: no apparent nausea or vomiting Anesthetic complications: no    Last Vitals:  Vitals:   06/27/18 1800 06/27/18 1912  BP: (!) 128/46   Pulse:  91  Resp: 15 (!) 26  Temp: 36.7 C 37.5 C  SpO2:  100%    Last Pain:  Vitals:   06/27/18 1734  TempSrc: Axillary  PainSc:                  Lucretia Kern

## 2018-06-27 NOTE — Progress Notes (Signed)
PROGRESS NOTE    Courtney [name]  WHQ:[id number] DOB: [birth year] DOA: 06/25/2018 PCP: Jordan Hawks, PA-C   Brief Narrative: Patient is a 73 year old female with history of uncontrolled type diabetes mellitus, coronary artery disease, OSA on CPAP, asthma, COPD who presents to the emergency department with complaints of generalized weakness, gluteal abscess for several days.  She was found to be septic with [place] gangrene and was taken emergently to the OR.  She has gone for repeat debridement today by general surgery.  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  Repeat I&D on 2/27.  Currently on broad-spectrum antibiotics.  Currently on ceftriaxone, vancomycin and clindamycin for now.  Will follow blood cultures and intraoperative cultures.  So far showing mixed organisms on wound cultures.  Wound care as per general surgery.  Stage II pressure ulcers of the right and left medial sacrum/right groin: Present on admission.  Wound care as per general surgery.  Optimize nutritional status.  Uncontrolled type 2 diabetes mellitus: Hemoglobin A1c of 12.1.  Follows with endocrinologist Dr. Jolyne Loa.  Continue Lantus and sliding's insulin for now.  Diabetic oriented following.  COPD/asthma: Noncompliant with home oxygen.  Continue supplemental oxygen as needed.  Not on any home medications.  Currently COPD stable.  We will put her on albuterol inhaler on discharge.  Follow-up with pulmonology as an outpatient.  OSA: Continue CPAP at night.  Coronary artery disease: Continue metoprolol, statin.  Not on aspirin at home.  Hypertension: Continue metoprolol.  PRN meds  Hyperlipidemia: Continue  statin  Obesity: BMI of 36.  Dietitian consulted.  Nutrition Problem: Increased nutrient needs Etiology: wound healing, acute illness, post-op healing      DVT prophylaxis:Heparin [place] Code Status: Full Family Communication: None present at the bedside Disposition Plan: Home after full work-up and management   Consultants: General surgery  Procedures: I&D  Antimicrobials:  Anti-infectives (From admission, onward)   Start     Dose/Rate Route Frequency Ordered Stop   06/26/18 2200  [MAR Hold]  vancomycin (VANCOCIN) 1,500 mg in sodium chloride 0.9 % 500 mL IVPB     (MAR Hold since Thu 06/27/2018 at 1225. Reason: Transfer to a Procedural area.)   1,500 mg 250 mL/hr over 120 Minutes Intravenous Every 24 hours 06/26/18 0446     06/26/18 0600  [MAR Hold]  clindamycin (CLEOCIN) IVPB 600 mg     (MAR Hold since Thu 06/27/2018 at 1225. Reason: Transfer to a Procedural area.)   600 mg 100 mL/hr over 30 Minutes Intravenous Every 8 hours 06/26/18 0508     06/26/18 0415  clindamycin (CLEOCIN) IVPB 300 mg  Status:  Discontinued     300 mg 100 mL/hr over 30 Minutes Intravenous Every 8 hours 06/26/18 0356 06/26/18 0508   06/26/18 0400  cefTRIAXone (ROCEPHIN) 2 g in sodium chloride 0.9 % 100 mL IVPB  Status:  Discontinued     2 g 200 mL/hr over 30 Minutes Intravenous Every 24 hours 06/26/18 0356 06/26/18 0409   06/25/18 2330  vancomycin (VANCOCIN) IVPB 1000 mg/200 mL premix     1,000 mg 200 mL/hr over 60 Minutes Intravenous  Once 06/25/18 1958 06/26/18 0015  06/25/18 1500  clindamycin (CLEOCIN) IVPB 600 mg     600 mg 100 mL/hr over 30 Minutes Intravenous  Once 06/25/18 1453 06/25/18 1657   06/25/18 1330  vancomycin (VANCOCIN) IVPB 1000 mg/200 mL premix     1,000 mg 200 mL/hr over 60 Minutes Intravenous  Once 06/25/18 1318 06/25/18 1606   06/25/18 1330  [MAR Hold]  cefTRIAXone (ROCEPHIN) 2 g in sodium chloride 0.9 % 100 mL IVPB     (MAR Hold since Thu 06/27/2018 at 1225. Reason: Transfer to a  Procedural area.)   2 g 200 mL/hr over 30 Minutes Intravenous Every 24 hours 06/25/18 1318        Subjective: Patient seen and examined the bedside this morning.  Hemodynamically stable.  Looks comfortable during my evaluation .She has severe swelling and tenderness on the pubic region.  Pain well controlled.  Objective: Vitals:   06/27/18 1326 06/27/18 1327 06/27/18 1505 06/27/18 1515  BP:    140/68  Pulse: 99 100  89  Resp: (!) 27 (!) 24  20  Temp:   98.8 F (37.1 C)   TempSrc:      SpO2: 99% 96%  100%  Weight:      Height:        Intake/Output Summary (Last 24 hours) at 06/27/2018 1518 Last data filed at 06/27/2018 1508 Gross per 24 hour  Intake 5390 ml  Output 825 ml  Net 4565 ml   Filed Weights   06/25/18 2034 06/27/18 0551 06/27/18 1241  Weight: 92.8 kg 95.5 kg 95.5 kg    Examination:  General exam: Not in distress, obese HEENT:PERRL,Oral mucosa moist, Ear/Nose normal on gross exam, central line on the right neck Respiratory system: Bilateral equal air entry, normal vesicular breath sounds, no wheezes or crackles  Cardiovascular system: S1 & S2 heard, RRR. No JVD, murmurs, rubs, gallops or clicks. No pedal edema. Gastrointestinal system: Abdomen is nondistended, soft and nontender. No organomegaly or masses felt. Normal bowel sounds heard. Central nervous system: Alert and oriented. No focal neurological deficits. Extremities: No edema, no clubbing ,no cyanosis, distal peripheral pulses palpable. Skin: Perirectal abscesses, right groin ulcer, edematous and tender suprapubic region MSK: Normal muscle bulk,tone ,power Psychiatry: Judgement and insight appear normal. Mood & affect appropriate.     Data Reviewed: I have personally reviewed following labs and imaging studies  CBC: Recent Labs  Lab 06/25/18 1209 06/25/18 2230 06/26/18 0745 06/27/18 0559  WBC 39.2* 42.3* 38.6* 25.7*  NEUTROABS 34.2* 40.2*  --   --   HGB 13.5 11.7* 11.1* 10.3*  HCT 46.0 39.0  36.6 34.0*  MCV 83.9 82.8 81.3 81.3  PLT 365 217 278 225   Basic Metabolic Panel: Recent Labs  Lab 06/25/18 1209 06/25/18 1947 06/26/18 0745 06/27/18 0559  NA 135 137 135 136  K 2.8* 2.5* 3.4* 3.6  CL 93* 105 102 105  CO2 25 22 24 23   GLUCOSE 257* 71 100* 225*  BUN 25* 23 21 19   CREATININE 1.05* 0.59 0.68 0.63  CALCIUM 9.1 7.5* 8.3* 8.4*   GFR: Estimated Creatinine Clearance: 67.8 mL/min (by C-G formula based on SCr of 0.63 mg/dL). Liver Function Tests: Recent Labs  Lab 06/25/18 1947  AST 44*  ALT 50*  ALKPHOS 147*  BILITOT 0.4  PROT 5.3*  ALBUMIN 1.9*   No results for input(s): LIPASE, AMYLASE in the last 168 hours. No results for input(s): AMMONIA in the last 168 hours. Coagulation Profile: Recent Labs  Lab 06/25/18 1859 06/25/18 1947  INR 1.2 1.2   Cardiac Enzymes: Recent Labs  Lab 06/25/18 1209  TROPONINI 0.03*   BNP (last 3 results) No results for input(s): PROBNP in the last 8760 hours. HbA1C: Recent Labs    06/25/18 1208 06/26/18 0745  HGBA1C 11.9* 12.1*   CBG: Recent Labs  Lab 06/26/18 1736 06/27/18 0020 06/27/18 0804 06/27/18 1211 06/27/18 1514  GLUCAP 243* 219* 185* 173* 137*   Lipid Profile: Recent Labs    06/26/18 0745  CHOL 82  HDL 13*  LDLCALC 54  TRIG 75  CHOLHDL 6.3   Thyroid Function Tests: No results for input(s): TSH, T4TOTAL, FREET4, T3FREE, THYROIDAB in the last 72 hours. Anemia Panel: No results for input(s): VITAMINB12, FOLATE, FERRITIN, TIBC, IRON, RETICCTPCT in the last 72 hours. Sepsis Labs: Recent Labs  Lab 06/25/18 1211 06/25/18 1554 06/25/18 1947 06/26/18 0100  PROCALCITON  --   --  8.03  --   LATICACIDVEN 5.0* 3.2* 2.7* 1.5    Recent Results (from the past 240 hour(s))  Culture, blood (routine x 2)     Status: None (Preliminary result)   Collection Time: 06/25/18 12:10 PM  Result Value Ref Range Status   Specimen Description   Final    BLOOD LEFT ANTECUBITAL Performed at [facility], [address], [place], [place] [address]    Special Requests   Final    BOTTLES DRAWN AEROBIC AND ANAEROBIC Blood Culture adequate volume Performed at [facility], [address], [place], [place] [address]    Culture   Final    NO GROWTH 2 DAYS Performed at [facility], [address], [place], [place] [address]    Report Status PENDING  Incomplete  Culture, blood (routine x 2)     Status: None (Preliminary result)   Collection Time: 06/25/18  2:06 PM  Result Value Ref Range Status   Specimen Description   Final    BLOOD LEFT ANTECUBITAL Performed at [facility], [address] [place], [place] [address]    Special Requests   Final    BOTTLES DRAWN AEROBIC AND ANAEROBIC Blood Culture adequate volume Performed at [facility], [address], [place], [place] [address]    Culture   Final    NO GROWTH 2 DAYS Performed at [facility], [address], [place], [place] [address]    Report Status PENDING  Incomplete  Aerobic/Anaerobic Culture (surgical/deep wound)     Status: None (Preliminary result)   Collection Time: 06/25/18  5:18 PM  Result Value Ref Range Status   Specimen Description   Final    ABSCESS Performed at [facility], [address] [place], [place] [address]    Special Requests RECTAL  Final   Gram Stain   Final    FEW WBC PRESENT, PREDOMINANTLY PMN ABUNDANT GRAM NEGATIVE RODS ABUNDANT GRAM POSITIVE COCCI    Culture   Final    RARE ESCHERICHIA COLI HOLDING FOR POSSIBLE ANAEROBE Performed at [facility], [address] [place], [place] [address]    Report Status PENDING  Incomplete   Organism ID, Bacteria ESCHERICHIA COLI  Final      Susceptibility   Escherichia coli - MIC*    AMPICILLIN 16 INTERMEDIATE Intermediate     CEFAZOLIN <=4 SENSITIVE Sensitive     CEFEPIME <=1 SENSITIVE Sensitive     CEFTAZIDIME <=1 SENSITIVE Sensitive      CEFTRIAXONE <=1 SENSITIVE Sensitive  CIPROFLOXACIN >=4 RESISTANT Resistant     GENTAMICIN <=1 SENSITIVE Sensitive     IMIPENEM <=0.25 SENSITIVE Sensitive     TRIMETH/SULFA <=20 SENSITIVE Sensitive     AMPICILLIN/SULBACTAM 4 SENSITIVE Sensitive     PIP/TAZO <=4 SENSITIVE Sensitive     Extended ESBL NEGATIVE Sensitive     * RARE ESCHERICHIA COLI  Culture, blood (x 2)     Status: None (Preliminary result)   Collection Time: 06/25/18  7:52 PM  Result Value Ref Range Status   Specimen Description   Final    BLOOD ARTERIAL LINE Performed at [facility], [address] [place], [place] [address]    Special Requests   Final    BOTTLES DRAWN AEROBIC AND ANAEROBIC Blood Culture adequate volume Performed at [facility], [address] [place], [place] [address]    Culture   Final    NO GROWTH 2 DAYS Performed at [facility], [address] [place], [place] [address]    Report Status PENDING  Incomplete  Culture, blood (x 2)     Status: None (Preliminary result)   Collection Time: 06/25/18  9:34 PM  Result Value Ref Range Status   Specimen Description BLOOD LEFT ARM  Final   Special Requests   Final    BOTTLES DRAWN AEROBIC ONLY Blood Culture results may not be optimal due to an inadequate volume of blood received in culture bottles   Culture   Final    NO GROWTH 2 DAYS Performed at [facility], [address] [place], [place] [address]    Report Status PENDING  Incomplete  MRSA PCR Screening     Status: None   Collection Time: 06/25/18 10:19 PM  Result Value Ref Range Status   MRSA by PCR NEGATIVE NEGATIVE Final    Comment:        The GeneXpert MRSA Assay (FDA approved for NASAL specimens only), is one component of a comprehensive MRSA colonization surveillance program. It is not intended to diagnose MRSA infection nor to guide or monitor treatment for MRSA infections. Performed at [facility],  [address] [place], [place] [address]          Radiology Studies: X-ray Chest Pa Or Ap  Result Date: 06/25/2018 CLINICAL DATA:  Central line placement EXAM: CHEST  1 VIEW COMPARISON:  02/11/2010 FINDINGS: Right internal jugular central line is been placed with the tip in the lower SVC. No pneumothorax. Heart is mildly enlarged with perihilar and lower lobe opacities and diffuse interstitial prominence, likely mild edema/CHF. No effusions or acute bony abnormality. IMPRESSION: Right central line tip in the SVC.  No pneumothorax. Mild edema/CHF. Electronically Signed   By: Charlett NoseKevin  Dover M.D.   On: 06/25/2018 18:38        Scheduled Meds: . [MAR Hold] chlorhexidine  15 mL Mouth Rinse BID  . [MAR Hold] Chlorhexidine Gluconate Cloth  6 each Topical Daily  . [MAR Hold] heparin injection (subcutaneous)  5,000 Units Subcutaneous Q8H  . [MAR Hold] insulin aspart  0-5 Units Subcutaneous QHS  . [MAR Hold] insulin aspart  0-9 Units Subcutaneous TID WC  . [MAR Hold] insulin glargine  25 Units Subcutaneous Daily  . [MAR Hold] mouth rinse  15 mL Mouth Rinse q12n4p  . [MAR Hold] metoprolol succinate  25 mg Oral Daily  . [MAR Hold] multivitamin with minerals  1 tablet Oral Daily  . [MAR Hold] nutrition supplement (JUVEN)  1 packet  Oral BID BM  . [MAR Hold] ENSURE MAX PROTEIN  11 oz Oral Daily  . [MAR Hold] rosuvastatin  20 mg Oral Daily  . [MAR Hold] sodium chloride flush  10-40 mL Intracatheter Q12H   Continuous Infusions: . sodium chloride 75 mL/hr at 06/26/18 2046  . [MAR Hold] cefTRIAXone (ROCEPHIN)  IV 0 g (06/26/18 1504)  . [MAR Hold] clindamycin (CLEOCIN) IV 0 mg (06/27/18 0700)  . lactated ringers 1,000 mL with potassium chloride 20 mEq infusion 100 mL/hr at 06/27/18 0854  . lactated ringers 10 mL/hr at 06/27/18 1309  . [MAR Hold] vancomycin Stopped (06/27/18 0009)     LOS: 2 days    Time spent:25 mins. More than 50% of that time was spent in counseling and/or coordination of  care.      Burnadette Pop, MD [facility] Pager [phone]  If 7PM-7AM, please contact night-coverage [email or web address] Password [facility] 06/27/2018, 3:18 PM

## 2018-06-27 NOTE — Transfer of Care (Signed)
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 (Right)  Patient Location: PACU  Anesthesia Type:General  Level of Consciousness:  sedated, patient cooperative and responds to stimulation  Airway & Oxygen Therapy:Patient Spontanous Breathing and Patient connected to face mask oxgen  Post-op Assessment:  Report given to PACU RN and Post -op Vital signs reviewed and stable  Post vital signs:  Reviewed and stable  Last Vitals:  Vitals:   06/27/18 1326 06/27/18 1327  BP:    Pulse: 99 100  Resp: (!) 27 (!) 24  Temp:    SpO2: 99% 96%    Complications: No apparent anesthesia complications

## 2018-06-27 NOTE — Anesthesia Preprocedure Evaluation (Signed)
Anesthesia Evaluation  Patient identified by MRN, date of birth, ID band Patient awake    Reviewed: Allergy & Precautions, NPO status , Patient's Chart, lab work & pertinent test results  History of Anesthesia Complications Negative for: history of anesthetic complications  Airway Mallampati: II  TM Distance: >3 FB Neck ROM: Full    Dental no notable dental hx.    Pulmonary asthma , sleep apnea , COPD, Current Smoker,    Pulmonary exam normal        Cardiovascular hypertension, + CAD and + Past MI  Normal cardiovascular exam     Neuro/Psych negative neurological ROS  negative psych ROS   GI/Hepatic negative GI ROS, Neg liver ROS,   Endo/Other  diabetes, Poorly Controlled  Renal/GU negative Renal ROS  negative genitourinary   Musculoskeletal negative musculoskeletal ROS (+)   Abdominal   Peds  Hematology negative hematology ROS (+)   Anesthesia Other Findings   Reproductive/Obstetrics                             Anesthesia Physical Anesthesia Plan  ASA: III  Anesthesia Plan: General   Post-op Pain Management:    Induction: Intravenous  PONV Risk Score and Plan: 2 and Ondansetron, Dexamethasone and Treatment may vary due to age or medical condition  Airway Management Planned: Oral ETT  Additional Equipment: None  Intra-op Plan:   Post-operative Plan: Extubation in OR  Informed Consent: I have reviewed the patients History and Physical, chart, labs and discussed the procedure including the risks, benefits and alternatives for the proposed anesthesia with the patient or authorized representative who has indicated his/her understanding and acceptance.     Dental advisory given  Plan Discussed with:   Anesthesia Plan Comments:         Anesthesia Quick Evaluation

## 2018-06-27 NOTE — Progress Notes (Signed)
[facility] Progress Note  2 Days Post-Op  Subjective: CC-  No complaints. Pain well controlled.  Tolerated diet yesterday. Denies abdominal pain, nausea, vomiting. WBC trending down 25.7, afebrile over night. Foley placed for urinary retention yesterday.  Worked with PT yesterday, recommending SNF.  Objective: Vital signs in last 24 hours: Temp:  [98.2 F (36.8 C)-100.6 F (38.1 C)] 99.7 F (37.6 C) (02/27 0700) Pulse Rate:  [89-107] 89 (02/26 2300) Resp:  [21-31] 23 (02/27 0700) BP: (127-208)/(51-175) 165/60 (02/27 0700) SpO2:  [92 %-99 %] 98 % (02/27 0147) Weight:  [95.5 kg] 95.5 kg (02/27 0551) Last BM Date: (Unknown )  Intake/Output from previous day: 02/26 0701 - 02/27 0700 In: 4887.5 [P.O.:720; I.V.:3318.1; IV Piggyback:849.4] Out: 1950 [Urine:1950] Intake/Output this shift: No intake/output data recorded.  PE: Gen:  Alert, NAD, pleasant HEENT: EOM's intact, pupils equal and round Card:  RRR Pulm:  Trace bilateral wheezing, no rhonchi, effort normal Abd: Soft, NT/ND, +BS GU: perineal dressing in place, had to be changed 3x over night due to saturation  Lab Results:  Recent Labs    06/26/18 0745 06/27/18 0559  WBC 38.6* 25.7*  HGB 11.1* 10.3*  HCT 36.6 34.0*  PLT 278 225   BMET Recent Labs    06/26/18 0745 06/27/18 0559  NA 135 136  K 3.4* 3.6  CL 102 105  CO2 24 23  GLUCOSE 100* 225*  BUN 21 19  CREATININE 0.68 0.63  CALCIUM 8.3* 8.4*   PT/INR Recent Labs    06/25/18 1859 06/25/18 1947  LABPROT 15.4* 14.7  INR 1.2 1.2   CMP     Component Value Date/Time   NA 136 06/27/2018 0559   NA 142 10/02/2016 0856   NA 142 08/06/2014 0854   K 3.6 06/27/2018 0559   K 4.8 08/06/2014 0854   CL 105 06/27/2018 0559   CL 103 01/26/2012 1242   CO2 23 06/27/2018 0559   CO2 29 08/06/2014 0854   GLUCOSE 225 (H) 06/27/2018 0559   GLUCOSE 372 (H) 08/06/2014 0854   GLUCOSE 122 (H) 01/26/2012 1242   BUN 19 06/27/2018 0559   BUN 11  10/02/2016 0856   BUN 12.1 08/06/2014 0854   CREATININE 0.63 06/27/2018 0559   CREATININE 1.1 08/06/2014 0854   CALCIUM 8.4 (L) 06/27/2018 0559   CALCIUM 10.6 (H) 08/06/2014 0854   PROT 5.3 (L) 06/25/2018 1947   PROT 6.7 10/02/2016 0856   PROT 6.9 08/06/2014 0854   ALBUMIN 1.9 (L) 06/25/2018 1947   ALBUMIN 4.1 10/02/2016 0856   ALBUMIN 3.6 08/06/2014 0854   AST 44 (H) 06/25/2018 1947   AST 12 08/06/2014 0854   ALT 50 (H) 06/25/2018 1947   ALT 15 08/06/2014 0854   ALKPHOS 147 (H) 06/25/2018 1947   ALKPHOS 140 08/06/2014 0854   BILITOT 0.4 06/25/2018 1947   BILITOT 0.2 10/02/2016 0856   BILITOT 0.29 08/06/2014 0854   GFRNONAA >60 06/27/2018 0559   GFRAA >60 06/27/2018 0559   Lipase  No results found for: LIPASE     Studies/Results: X-ray Chest Pa Or Ap  Result Date: 06/25/2018 CLINICAL DATA:  Central line placement EXAM: CHEST  1 VIEW COMPARISON:  02/11/2010 FINDINGS: Right internal jugular central line is been placed with the tip in the lower SVC. No pneumothorax. Heart is mildly enlarged with perihilar and lower lobe opacities and diffuse interstitial prominence, likely mild edema/CHF. No effusions or acute bony abnormality. IMPRESSION: Right central line tip in the SVC.  No pneumothorax.  Mild edema/CHF. Electronically Signed   By: Charlett Nose M.D.   On: 06/25/2018 18:38   Ct Abdomen Pelvis W Contrast  Result Date: 06/25/2018 CLINICAL DATA:  73 year old female with ball on buttock noticed yesterday. Initial encounter. EXAM: CT ABDOMEN AND PELVIS WITH CONTRAST TECHNIQUE: Multidetector CT imaging of the abdomen and pelvis was performed using the standard protocol following bolus administration of intravenous contrast. CONTRAST:  ISOVUE-300 IOPAMIDOL (ISOVUE-300) INJECTION 61% COMPARISON:  None. FINDINGS: Lower chest: Minimal basilar atelectasis. Mild cardiomegaly. Coronary artery calcifications. Hepatobiliary: No worrisome hepatic lesion. Suspect fatty infiltration. Post  cholecystectomy. Pancreas: No worrisome pancreatic mass or inflammation. Spleen: Subcentimeter area of enhancement dome of spleen of indeterminate etiology or significance. Spleen not enlarged. Adrenals/Urinary Tract: No obstructing stone or hydronephrosis. Several renal low-density structures too small to characterize although statistically likely cysts. Hyperplasia adrenal glands. Prominent size urinary bladder. Noncontrast filled imaging without acute abnormality. Stomach/Bowel: Besides necrotizing fasciitis impressing upon the right lateral aspect of the rectum, no primary bowel inflammatory process is noted. Appendix not clearly delineated. Portions of the stomach, small bowel and colon are under distended limiting evaluation. Vascular/Lymphatic: Atherosclerotic changes aorta and aortic branch vessels. No abdominal aortic aneurysm or large vessel occlusion. Inguinal adenopathy greater on right. Slightly prominent size right external iliac lymph node. Reproductive: Post hysterectomy.  No worrisome adnexal mass. 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 Soft tissue gas and inflammatory process extends from the upper right thigh/right buttock region anteriorly and superiorly impressing upon the right lateral aspect of the rectum and lower cervix. Anterior extension into the mons region greater on the right but also extending into the left mons. Gas and inflammatory soft tissue process than extends anterior to the pubic symphysis and anterior to the right femoral region (immediately adjacent to femoral vessels) and along the anterior and lateral aspect of the right pelvic and right lower abdominal wall. The rectum is immediately adjacent to this inflammatory process and displaced slightly to the left. No clear communication with the rectum identified. Lateral to the upper right femur is a 1.3 cm subcutaneous lesion possibly a sebaceous cyst. If no free intraperitoneal air or  drainable fluid collection. No bowel containing hernia. Small fat containing hernia periumbilical region. Musculoskeletal: Degenerative changes lower thoracic and lumbar spine most notable L4-5 and L5-S1 level. Mild hip joint degenerative changes and sacroiliac joint degenerative changes. IMPRESSION: 1. 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 Soft tissue gas and inflammatory process extends from the upper right thigh/right buttock region anteriorly and superiorly impressing upon the right lateral aspect of the rectum and lower cervix. Anterior extension into the mons region greater on the right but also extending into the left mons. Gas and inflammatory soft tissue process than extends anterior to the pubic symphysis and anterior to the right femoral region (immediately adjacent to femoral vessels) and along the anterior and lateral aspect of the right pelvic and right lower abdominal wall. 2. The rectum is immediately adjacent to this inflammatory process and displaced slightly to the left. No clear communication with the rectum identified. 3. Inguinal adenopathy greater on the right. 4. Lateral to the upper right femur is a 1.3 cm subcutaneous lesion possibly a sebaceous cyst. 5. Subcentimeter hyperdensity dome of the spleen of indeterminate etiology/significance. 6. Scattered small low-density renal lesions too small to characterize statistically likely cysts. 7. Suspect fatty infiltration liver. 8. Post cholecystectomy. 9. Appendix not visualized. 10.  Aortic Atherosclerosis (ICD10-I70.0). 11. Mild cardiomegaly with coronary artery calcifications. 12. Degenerative changes  lower thoracic and lumbar spine. These results were called by telephone at the time of interpretation on 06/25/2018 at 3:22 pm to Dr. Meridee Score , who verbally acknowledged these results. Electronically Signed   By: Lacy Duverney M.D.   On: 06/25/2018 15:51    Anti-infectives: Anti-infectives (From  admission, onward)   Start     Dose/Rate Route Frequency Ordered Stop   06/26/18 2200  vancomycin (VANCOCIN) 1,500 mg in sodium chloride 0.9 % 500 mL IVPB     1,500 mg 250 mL/hr over 120 Minutes Intravenous Every 24 hours 06/26/18 0446     06/26/18 0600  clindamycin (CLEOCIN) IVPB 600 mg     600 mg 100 mL/hr over 30 Minutes Intravenous Every 8 hours 06/26/18 0508     06/26/18 0415  clindamycin (CLEOCIN) IVPB 300 mg  Status:  Discontinued     300 mg 100 mL/hr over 30 Minutes Intravenous Every 8 hours 06/26/18 0356 06/26/18 0508   06/26/18 0400  cefTRIAXone (ROCEPHIN) 2 g in sodium chloride 0.9 % 100 mL IVPB  Status:  Discontinued     2 g 200 mL/hr over 30 Minutes Intravenous Every 24 hours 06/26/18 0356 06/26/18 0409   06/25/18 2330  vancomycin (VANCOCIN) IVPB 1000 mg/200 mL premix     1,000 mg 200 mL/hr over 60 Minutes Intravenous  Once 06/25/18 1958 06/26/18 0015   06/25/18 1500  clindamycin (CLEOCIN) IVPB 600 mg     600 mg 100 mL/hr over 30 Minutes Intravenous  Once 06/25/18 1453 06/25/18 1657   06/25/18 1330  vancomycin (VANCOCIN) IVPB 1000 mg/200 mL premix     1,000 mg 200 mL/hr over 60 Minutes Intravenous  Once 06/25/18 1318 06/25/18 1606   06/25/18 1330  cefTRIAXone (ROCEPHIN) 2 g in sodium chloride 0.9 % 100 mL IVPB     2 g 200 mL/hr over 30 Minutes Intravenous Every 24 hours 06/25/18 1318         Assessment/Plan Uncontrolled DM - A1c 12.1 HTN HLD CAD H/o STEMI 2011 s/p RCA stent COPD Acute on chronic anemia - Hg 10.3, monitor Urinary retention - foley placed 2/26  Sepsis Fournier's Gangrene  S/p incision and drainage perirectal abscess 2/25 Dr. Maisie Fus - POD#2 - intraop cultures: RARE ESCHERICHIA COLI, report pending - Back to OR today for further debridement and dressing change.   ID -rocephin/clindamycin/vancomycin 2/25>>day#2 VTE -SCDs, sq heparin FEN -IVF, NPO for proceudre Foley -placed 2/26 for retention Follow up -TBD   LOS: 2 days     Franne Forts , [facility] 06/27/2018, 8:37 AM Pager: [phone] Mon-Thurs 7:00 am-4:30 pm Fri 7:00 am -11:30 AM Sat-Sun 7:00 am-11:30 am

## 2018-06-27 NOTE — Op Note (Signed)
06/25/2018 - 06/27/2018  2:48 PM  PATIENT:  Courtney [name]  73 y.o. female  Patient Care Team: Barbarann Ehlers as PCP - General (Physician Assistant) Benjiman Core, MD (Hematology and Oncology)  PRE-OPERATIVE DIAGNOSIS:  Fourniers gangrene  POST-OPERATIVE DIAGNOSIS:  Fourniers gangrene  PROCEDURE:   EXCISIONAL DEBRIDEMENT   Surgeon(s): Romie Levee, MD  ASSISTANT: none   ANESTHESIA:   general  EBL: 89ml  SPECIMEN:  No Specimen  DISPOSITION OF SPECIMEN:  N/A  COUNTS:  YES  PLAN OF CARE: Pt admitted  PATIENT DISPOSITION:  PACU - hemodynamically stable.  INDICATION: 73 y.o. F with uncontrolled diabetes and severe perineal infection.  She is now hemodynamically stable and ready for full debridement.    Frequency of debridement: twice Area of body debrided: perineum, right side Presence and extent of infected tissue: significant Presence and extent of non viable tissue: significant  OR FINDINGS: significant necrotic tissue tracking around labia, down into gluteal region and up into inguinal canal  DESCRIPTION: The patient was identified in the pre op holding area and taken to the OR, where they were laid supine on the OR table.  General anesthesia was induced.  The operative area was prepped and draped in the usual sterile fashion.  A surgical time out was performed, indicating the correct patient, procedure, positioning and pre-operative antibiotics.   After this was completed, the wound was measured to be 5 x 10cm.  The total area of devitalized tissue was 30 x 10cm.  Scissors and bovie were used to debride the wound to vital tissues.  Debridement was carried down to the level of muscle and bone.  Skin and fat were removed.  There was necrotic material in the wound that would inhibit healing and or promote adjacent tissue breakdown.  At the end of the procedure the debrided area measured 30 x 10cm.  The wound was then packed with Kerlex roll gauze and covered with a  sterile dressing.  The patient was awakened from anesthesia and sent to the PACU in stable condition.  All counts were correct per OR staff.

## 2018-06-28 ENCOUNTER — Encounter (HOSPITAL_COMMUNITY): Payer: Self-pay | Admitting: General Surgery

## 2018-06-28 DIAGNOSIS — Z794 Long term (current) use of insulin: Secondary | ICD-10-CM

## 2018-06-28 DIAGNOSIS — J449 Chronic obstructive pulmonary disease, unspecified: Secondary | ICD-10-CM

## 2018-06-28 DIAGNOSIS — Z91012 Allergy to eggs: Secondary | ICD-10-CM

## 2018-06-28 DIAGNOSIS — F17210 Nicotine dependence, cigarettes, uncomplicated: Secondary | ICD-10-CM

## 2018-06-28 DIAGNOSIS — I2510 Atherosclerotic heart disease of native coronary artery without angina pectoris: Secondary | ICD-10-CM

## 2018-06-28 DIAGNOSIS — M726 Necrotizing fasciitis: Secondary | ICD-10-CM

## 2018-06-28 DIAGNOSIS — E0865 Diabetes mellitus due to underlying condition with hyperglycemia: Secondary | ICD-10-CM

## 2018-06-28 DIAGNOSIS — Z880 Allergy status to penicillin: Secondary | ICD-10-CM

## 2018-06-28 DIAGNOSIS — E1152 Type 2 diabetes mellitus with diabetic peripheral angiopathy with gangrene: Secondary | ICD-10-CM

## 2018-06-28 DIAGNOSIS — A480 Gas gangrene: Secondary | ICD-10-CM

## 2018-06-28 DIAGNOSIS — N7689 Other specified inflammation of vagina and vulva: Secondary | ICD-10-CM

## 2018-06-28 DIAGNOSIS — B9620 Unspecified Escherichia coli [E. coli] as the cause of diseases classified elsewhere: Secondary | ICD-10-CM

## 2018-06-28 LAB — CBC WITH DIFFERENTIAL/PLATELET
Abs Immature Granulocytes: 0.2 10*3/uL — ABNORMAL HIGH (ref 0.00–0.07)
Band Neutrophils: 5 %
Basophils Absolute: 0 10*3/uL (ref 0.0–0.1)
Basophils Relative: 0 %
Eosinophils Absolute: 0 10*3/uL (ref 0.0–0.5)
Eosinophils Relative: 0 %
HCT: 32.1 % — ABNORMAL LOW (ref 36.0–46.0)
Hemoglobin: 9.4 g/dL — ABNORMAL LOW (ref 12.0–15.0)
Lymphocytes Relative: 2 %
Lymphs Abs: 0.5 10*3/uL — ABNORMAL LOW (ref 0.7–4.0)
MCH: 24.4 pg — ABNORMAL LOW (ref 26.0–34.0)
MCHC: 29.3 g/dL — ABNORMAL LOW (ref 30.0–36.0)
MCV: 83.2 fL (ref 80.0–100.0)
MONOS PCT: 2 %
Metamyelocytes Relative: 1 %
Monocytes Absolute: 0.5 10*3/uL (ref 0.1–1.0)
Neutro Abs: 22.6 10*3/uL — ABNORMAL HIGH (ref 1.7–7.7)
Neutrophils Relative %: 90 %
Platelets: 236 10*3/uL (ref 150–400)
RBC: 3.86 MIL/uL — ABNORMAL LOW (ref 3.87–5.11)
RDW: 14.7 % (ref 11.5–15.5)
WBC: 23.8 10*3/uL — ABNORMAL HIGH (ref 4.0–10.5)
nRBC: 0 % (ref 0.0–0.2)

## 2018-06-28 LAB — BASIC METABOLIC PANEL
Anion gap: 4 — ABNORMAL LOW (ref 5–15)
BUN: 13 mg/dL (ref 8–23)
CO2: 25 mmol/L (ref 22–32)
Calcium: 8.1 mg/dL — ABNORMAL LOW (ref 8.9–10.3)
Chloride: 108 mmol/L (ref 98–111)
Creatinine, Ser: 0.58 mg/dL (ref 0.44–1.00)
GFR calc Af Amer: >60 mL/min (ref >60)
GFR calc non Af Amer: >60 mL/min (ref >60)
Glucose, Bld: 121 mg/dL — ABNORMAL HIGH (ref 70–99)
POTASSIUM: 3.8 mmol/L (ref 3.5–5.1)
Sodium: 137 mmol/L (ref 135–145)

## 2018-06-28 LAB — GLUCOSE, CAPILLARY
Glucose-Capillary: 110 mg/dL — ABNORMAL HIGH (ref 70–99)
Glucose-Capillary: 163 mg/dL — ABNORMAL HIGH (ref 70–99)
Glucose-Capillary: 237 mg/dL — ABNORMAL HIGH (ref 70–99)
Glucose-Capillary: 193 mg/dL — ABNORMAL HIGH (ref 70–99)

## 2018-06-28 MED ORDER — LISINOPRIL 2.5 MG PO TABS
5.0000 mg | ORAL_TABLET | Freq: Every day | ORAL | Status: DC
  Administered 2018-06-28: 5 mg via ORAL
  Filled 2018-06-28: qty 2

## 2018-06-28 MED ORDER — METRONIDAZOLE 500 MG PO TABS
500.0000 mg | ORAL_TABLET | Freq: Three times a day (TID) | ORAL | Status: DC
  Administered 2018-06-28 – 2018-07-08 (×31): 500 mg via ORAL
  Filled 2018-06-28 (×31): qty 1

## 2018-06-28 MED ORDER — SODIUM CHLORIDE 0.9 % IV BOLUS
1000.0000 mL | Freq: Once | INTRAVENOUS | Status: AC
  Administered 2018-06-28: 1000 mL via INTRAVENOUS

## 2018-06-28 MED ORDER — CHLORHEXIDINE GLUCONATE CLOTH 2 % EX PADS
6.0000 | MEDICATED_PAD | Freq: Every day | CUTANEOUS | Status: DC
  Administered 2018-06-28 – 2018-07-07 (×9): 6 via TOPICAL

## 2018-06-28 MED ORDER — ENSURE MAX PROTEIN PO LIQD
11.0000 [oz_av] | Freq: Two times a day (BID) | ORAL | Status: DC
  Administered 2018-06-28 – 2018-07-08 (×15): 11 [oz_av] via ORAL
  Filled 2018-06-28 (×20): qty 330

## 2018-06-28 NOTE — Progress Notes (Signed)
PROGRESS NOTE    Courtney [name]  ZRA:[id number] DOB: 29-Jan-1946 DOA: 06/25/2018 PCP: Jordan Hawks, PA-C   Brief Narrative: Patient is a 73 year old female with history of uncontrolled type diabetes mellitus, coronary artery disease, OSA on CPAP, asthma, COPD who presents to the emergency department with complaints of generalized weakness, gluteal abscess for several days.  She was found to be septic with [place] gangrene/NF and was taken emergently to the OR.  She underwent  for repeat debridement  by general surgery on 06/27/18.  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  Repeat I&D on 2/27.  Currently on broad-spectrum antibiotics.  Currently on ceftriaxone, vancomycin and clindamycin .  Will follow blood cultures and intraoperative cultures.  So far showing Ecoli and mixed organisms on wound cultures.  Wound care as per general surgery. I have requested for ID consultation today for recommendation on antibiotics.  Stage II pressure ulcers of the right and left medial sacrum/right groin: Present on admission.  Wound care as per general surgery.  Optimize nutritional status.  Uncontrolled type 2 diabetes mellitus: Hemoglobin A1c of 12.1.  Follows with endocrinologist Dr. Jolyne Loa.  Continue Lantus and sliding's insulin for now.  Diabetic coordinator  following.  COPD/asthma: Noncompliant with home oxygen.  Continue supplemental oxygen as needed.  Not on any home medications.  Currently COPD is stable.  We will put her on albuterol inhaler on discharge.  Follow-up with pulmonology as an outpatient.  OSA: Continue CPAP at night.  Coronary artery disease: Continue metoprolol, statin.  Not on  aspirin at home.  Hypertension: Continue metoprolol.  Added lisinopril.PRN meds.  Hypertensive this morning.  Hyperlipidemia: Continue statin  Obesity: BMI of 36.  Dietitian consulted.  Nutrition Problem: Increased nutrient needs Etiology: wound healing, acute illness, post-op healing      DVT prophylaxis:Heparin [place] Code Status: Full Family Communication: None present at the bedside Disposition Plan: Home after full  Management,surgery clearance   Consultants: General surgery,ID  Procedures: I&D  Antimicrobials:  Anti-infectives (From admission, onward)   Start     Dose/Rate Route Frequency Ordered Stop   06/26/18 2200  vancomycin (VANCOCIN) 1,500 mg in sodium chloride 0.9 % 500 mL IVPB     1,500 mg 250 mL/hr over 120 Minutes Intravenous Every 24 hours 06/26/18 0446     06/26/18 0600  clindamycin (CLEOCIN) IVPB 600 mg     600 mg 100 mL/hr over 30 Minutes Intravenous Every 8 hours 06/26/18 0508     06/26/18 0415  clindamycin (CLEOCIN) IVPB 300 mg  Status:  Discontinued     300 mg 100 mL/hr over 30 Minutes Intravenous Every 8 hours 06/26/18 0356 06/26/18 0508   06/26/18 0400  cefTRIAXone (ROCEPHIN) 2 g in sodium chloride 0.9 % 100 mL IVPB  Status:  Discontinued     2 g 200 mL/hr over 30 Minutes Intravenous Every 24 hours 06/26/18 0356 06/26/18 0409   06/25/18 2330  vancomycin (VANCOCIN) IVPB 1000 mg/200 mL premix     1,000 mg 200 mL/hr over 60 Minutes Intravenous  Once 06/25/18 1958 06/26/18 0015   06/25/18 1500  clindamycin (CLEOCIN) IVPB 600 mg     600 mg 100  mL/hr over 30 Minutes Intravenous  Once 06/25/18 1453 06/25/18 1657   06/25/18 1330  vancomycin (VANCOCIN) IVPB 1000 mg/200 mL premix     1,000 mg 200 mL/hr over 60 Minutes Intravenous  Once 06/25/18 1318 06/25/18 1606   06/25/18 1330  cefTRIAXone (ROCEPHIN) 2 g in sodium chloride 0.9 % 100 mL IVPB     2 g 200 mL/hr over 30 Minutes Intravenous Every 24 hours 06/25/18 1318        Subjective: Patient seen and  examined the bedside this morning.  Mildly hypertensive today.  Looks comfortable.  Pain well controlled.  Objective: Vitals:   06/28/18 0500 06/28/18 0600 06/28/18 0724 06/28/18 0918  BP:  (!) 84/52 (!) 145/53 (!) 154/72  Pulse: 88 88 90 94  Resp: 20 18 (!) 21   Temp: 98.6 F (37 C) 98.6 F (37 C) 98.8 F (37.1 C)   TempSrc:      SpO2: 100% 100% 98%   Weight:      Height:        Intake/Output Summary (Last 24 hours) at 06/28/2018 1155 Last data filed at 06/28/2018 1100 Gross per 24 hour  Intake 3346.56 ml  Output 700 ml  Net 2646.56 ml   Filed Weights   06/25/18 2034 06/27/18 0551 06/27/18 1241  Weight: 92.8 kg 95.5 kg 95.5 kg    Examination:  General exam: Appears calm and comfortable ,Not in distress,obese HEENT:PERRL,Oral mucosa moist, Ear/Nose normal on gross exam Respiratory system: Bilateral equal air entry, normal vesicular breath sounds, no wheezes or crackles  Cardiovascular system: S1 & S2 heard, RRR. No JVD, murmurs, rubs, gallops or clicks. Gastrointestinal system: Abdomen is nondistended, soft and nontender. No organomegaly or masses felt. Normal bowel sounds heard. Central nervous system: Alert and oriented. No focal neurological deficits. Extremities: No edema, no clubbing ,no cyanosis, distal peripheral pulses palpable. Skin: Perineal wounds wrapped with dressings.  Severe tenderness on the perineal , suprapubic region    Data Reviewed: I have personally reviewed following labs and imaging studies  CBC: Recent Labs  Lab 06/25/18 1209 06/25/18 2230 06/26/18 0745 06/27/18 0559 06/28/18 0345  WBC 39.2* 42.3* 38.6* 25.7* 23.8*  NEUTROABS 34.2* 40.2*  --   --  22.6*  HGB 13.5 11.7* 11.1* 10.3* 9.4*  HCT 46.0 39.0 36.6 34.0* 32.1*  MCV 83.9 82.8 81.3 81.3 83.2  PLT 365 217 278 225 236   Basic Metabolic Panel: Recent Labs  Lab 06/25/18 1209 06/25/18 1947 06/26/18 0745 06/27/18 0559 06/28/18 0345  NA 135 137 135 136 137  K 2.8* 2.5* 3.4* 3.6  3.8  CL 93* 105 102 105 108  CO2 25 22 24 23 25   GLUCOSE 257* 71 100* 225* 121*  BUN 25* 23 21 19 13   CREATININE 1.05* 0.59 0.68 0.63 0.58  CALCIUM 9.1 7.5* 8.3* 8.4* 8.1*   GFR: Estimated Creatinine Clearance: 67.8 mL/min (by C-G formula based on SCr of 0.58 mg/dL). Liver Function Tests: Recent Labs  Lab 06/25/18 1947  AST 44*  ALT 50*  ALKPHOS 147*  BILITOT 0.4  PROT 5.3*  ALBUMIN 1.9*   No results for input(s): LIPASE, AMYLASE in the last 168 hours. No results for input(s): AMMONIA in the last 168 hours. Coagulation Profile: Recent Labs  Lab 06/25/18 1859 06/25/18 1947  INR 1.2 1.2   Cardiac Enzymes: Recent Labs  Lab 06/25/18 1209  TROPONINI 0.03*   BNP (last 3 results) No results for input(s): PROBNP in the last 8760 hours. HbA1C: Recent Labs  06/25/18 1208 06/26/18 0745  HGBA1C 11.9* 12.1*   CBG: Recent Labs  Lab 06/27/18 1854 06/27/18 1905 06/27/18 2243 06/28/18 0727 06/28/18 1132  GLUCAP 169* 177* 158* 110* 163*   Lipid Profile: Recent Labs    06/26/18 0745  CHOL 82  HDL 13*  LDLCALC 54  TRIG 75  CHOLHDL 6.3   Thyroid Function Tests: No results for input(s): TSH, T4TOTAL, FREET4, T3FREE, THYROIDAB in the last 72 hours. Anemia Panel: No results for input(s): VITAMINB12, FOLATE, FERRITIN, TIBC, IRON, RETICCTPCT in the last 72 hours. Sepsis Labs: Recent Labs  Lab 06/25/18 1211 06/25/18 1554 06/25/18 1947 06/26/18 0100  PROCALCITON  --   --  8.03  --   LATICACIDVEN 5.0* 3.2* 2.7* 1.5    Recent Results (from the past 240 hour(s))  Culture, blood (routine x 2)     Status: None (Preliminary result)   Collection Time: 06/25/18 12:10 PM  Result Value Ref Range Status   Specimen Description BLOOD LEFT ANTECUBITAL  Final   Special Requests   Final    BOTTLES DRAWN AEROBIC AND ANAEROBIC Blood Culture adequate volume Performed at [facility], [address], [place], [place] [address]    Culture NO GROWTH 3 DAYS   Final   Report Status PENDING  Incomplete  Culture, blood (routine x 2)     Status: None (Preliminary result)   Collection Time: 06/25/18  2:06 PM  Result Value Ref Range Status   Specimen Description BLOOD LEFT ANTECUBITAL  Final   Special Requests   Final    BOTTLES DRAWN AEROBIC AND ANAEROBIC Blood Culture adequate volume Performed at [facility], [address] [place], [place] [address]    Culture NO GROWTH 3 DAYS  Final   Report Status PENDING  Incomplete  Aerobic/Anaerobic Culture (surgical/deep wound)     Status: None (Preliminary result)   Collection Time: 06/25/18  5:18 PM  Result Value Ref Range Status   Specimen Description   Final    ABSCESS Performed at [facility], [address], [place], [place] [address]    Special Requests RECTAL  Final   Gram Stain   Final    FEW WBC PRESENT, PREDOMINANTLY PMN ABUNDANT GRAM NEGATIVE RODS ABUNDANT GRAM POSITIVE COCCI    Culture   Final    RARE ESCHERICHIA COLI HOLDING FOR POSSIBLE ANAEROBE Performed at [facility], [address] [place], [place] [address]    Report Status PENDING  Incomplete   Organism ID, Bacteria ESCHERICHIA COLI  Final      Susceptibility   Escherichia coli - MIC*    AMPICILLIN 16 INTERMEDIATE Intermediate     CEFAZOLIN <=4 SENSITIVE Sensitive     CEFEPIME <=1 SENSITIVE Sensitive     CEFTAZIDIME <=1 SENSITIVE Sensitive     CEFTRIAXONE <=1 SENSITIVE Sensitive     CIPROFLOXACIN >=4 RESISTANT Resistant     GENTAMICIN <=1 SENSITIVE Sensitive     IMIPENEM <=0.25 SENSITIVE Sensitive     TRIMETH/SULFA <=20 SENSITIVE Sensitive     AMPICILLIN/SULBACTAM 4 SENSITIVE Sensitive     PIP/TAZO <=4 SENSITIVE Sensitive     Extended ESBL NEGATIVE Sensitive     * RARE ESCHERICHIA COLI  Culture, blood (x 2)     Status: None (Preliminary result)   Collection Time: 06/25/18  7:52 PM  Result Value Ref Range Status   Specimen Description BLOOD ARTERIAL LINE  Final    Special Requests   Final    BOTTLES  DRAWN AEROBIC AND ANAEROBIC Blood Culture adequate volume Performed at [facility], [address], [place], [place] [address]    Culture NO GROWTH 3 DAYS  Final   Report Status PENDING  Incomplete  Culture, blood (x 2)     Status: None (Preliminary result)   Collection Time: 06/25/18  9:34 PM  Result Value Ref Range Status   Specimen Description BLOOD LEFT ARM  Final   Special Requests   Final    BOTTLES DRAWN AEROBIC ONLY Blood Culture results may not be optimal due to an inadequate volume of blood received in culture bottles   Culture NO GROWTH 3 DAYS  Final   Report Status PENDING  Incomplete  MRSA PCR Screening     Status: None   Collection Time: 06/25/18 10:19 PM  Result Value Ref Range Status   MRSA by PCR NEGATIVE NEGATIVE Final    Comment:        The GeneXpert MRSA Assay (FDA approved for NASAL specimens only), is one component of a comprehensive MRSA colonization surveillance program. It is not intended to diagnose MRSA infection nor to guide or monitor treatment for MRSA infections. Performed at [facility], [address], [place], [place] [address]   Urine culture     Status: None   Collection Time: 06/26/18  1:35 PM  Result Value Ref Range Status   Specimen Description   Final    URINE, RANDOM Performed at [facility], [address] [place], [place] [address]    Special Requests   Final    Normal Performed at [facility], [address], [place], [place] [address]    Culture   Final    NO GROWTH Performed at [facility], [address], [place], [place] [address]    Report Status 06/27/2018 FINAL  Final         Radiology Studies: No results found.      Scheduled Meds: . chlorhexidine  15 mL Mouth Rinse BID  . Chlorhexidine Gluconate Cloth  6 each Topical Daily  . heparin injection (subcutaneous)  5,000 Units  Subcutaneous Q8H  . insulin aspart  0-5 Units Subcutaneous QHS  . insulin aspart  0-9 Units Subcutaneous TID WC  . insulin glargine  25 Units Subcutaneous Daily  . mouth rinse  15 mL Mouth Rinse q12n4p  . metoprolol succinate  25 mg Oral Daily  . multivitamin with minerals  1 tablet Oral Daily  . nutrition supplement (JUVEN)  1 packet Oral BID BM  . ENSURE MAX PROTEIN  11 oz Oral Daily  . rosuvastatin  20 mg Oral Daily  . sodium chloride flush  10-40 mL Intracatheter Q12H   Continuous Infusions: . sodium chloride 75 mL/hr at 06/26/18 2046  . cefTRIAXone (ROCEPHIN)  IV 0 g (06/26/18 1504)  . clindamycin (CLEOCIN) IV Stopped (06/28/18 4259)  . lactated ringers 1,000 mL with potassium chloride 20 mEq infusion 100 mL/hr at 06/28/18 0553  . vancomycin Stopped (06/28/18 0038)     LOS: 3 days    Time spent:25 mins. More than 50% of that time was spent in counseling and/or coordination of care.      Burnadette Pop, MD [facility] Pager [phone]  If 7PM-7AM, please contact night-coverage [email or web address] Password [facility] 06/28/2018, 11:55 AM

## 2018-06-28 NOTE — Progress Notes (Signed)
[facility] Progress Note  1 Day Post-Op  Subjective: CC-  Comfortable this morning. Waiting for breakfast. Anxious about first dressing change.  WBC trending down, afebrile. Only 500cc UOP recorded for yesterday. Creatinine WNL. Foley flushes and is working well.  Objective: Vital signs in last 24 hours: Temp:  [98.1 F (36.7 C)-100.2 F (37.9 C)] 98.8 F (37.1 C) (02/28 0724) Pulse Rate:  [88-104] 90 (02/28 0724) Resp:  [0-37] 21 (02/28 0724) BP: (84-177)/(31-91) 145/53 (02/28 0724) SpO2:  [93 %-100 %] 98 % (02/28 0724) Weight:  [95.5 kg] 95.5 kg (02/27 1241) Last BM Date: 06/25/18  Intake/Output from previous day: 02/27 0701 - 02/28 0700 In: 3346.6 [P.O.:100; I.V.:2399.8; IV Piggyback:846.8] Out: 600 [Urine:550; Blood:50] Intake/Output this shift: No intake/output data recorded.  PE: Gen: Alert, NAD, pleasant HEENT: EOM's intact, pupils equal and round Card: RRR Pulm:effort normal Abd: Soft, NT/ND, +BS GU: wound tunnels very deep towards right inguinal canal and anus, some purulent drainage noted on kerlex that was removed. Persistent induration/edema around wound and into labia. Purulent drainage noted around foley     Lab Results:  Recent Labs    06/27/18 0559 06/28/18 0345  WBC 25.7* 23.8*  HGB 10.3* 9.4*  HCT 34.0* 32.1*  PLT 225 236   BMET Recent Labs    06/27/18 0559 06/28/18 0345  NA 136 137  K 3.6 3.8  CL 105 108  CO2 23 25  GLUCOSE 225* 121*  BUN 19 13  CREATININE 0.63 0.58  CALCIUM 8.4* 8.1*   PT/INR Recent Labs    06/25/18 1859 06/25/18 1947  LABPROT 15.4* 14.7  INR 1.2 1.2   CMP     Component Value Date/Time   NA 137 06/28/2018 0345   NA 142 10/02/2016 0856   NA 142 08/06/2014 0854   K 3.8 06/28/2018 0345   K 4.8 08/06/2014 0854   CL 108 06/28/2018 0345   CL 103 01/26/2012 1242   CO2 25 06/28/2018 0345   CO2 29 08/06/2014 0854   GLUCOSE 121 (H) 06/28/2018 0345   GLUCOSE 372 (H) 08/06/2014 0854   GLUCOSE 122 (H) 01/26/2012 1242   BUN 13 06/28/2018 0345   BUN 11 10/02/2016 0856   BUN 12.1 08/06/2014 0854   CREATININE 0.58 06/28/2018 0345   CREATININE 1.1 08/06/2014 0854   CALCIUM 8.1 (L) 06/28/2018 0345   CALCIUM 10.6 (H) 08/06/2014 0854   PROT 5.3 (L) 06/25/2018 1947   PROT 6.7 10/02/2016 0856   PROT 6.9 08/06/2014 0854   ALBUMIN 1.9 (L) 06/25/2018 1947   ALBUMIN 4.1 10/02/2016 0856   ALBUMIN 3.6 08/06/2014 0854   AST 44 (H) 06/25/2018 1947   AST 12 08/06/2014 0854   ALT 50 (H) 06/25/2018 1947   ALT 15 08/06/2014 0854   ALKPHOS 147 (H) 06/25/2018 1947   ALKPHOS 140 08/06/2014 0854   BILITOT 0.4 06/25/2018 1947   BILITOT 0.2 10/02/2016 0856   BILITOT 0.29 08/06/2014 0854   GFRNONAA >60 06/28/2018 0345   GFRAA >60 06/28/2018 0345   Lipase  No results found for: LIPASE     Studies/Results: No results found.  Anti-infectives: Anti-infectives (From admission, onward)   Start     Dose/Rate Route Frequency Ordered Stop   06/26/18 2200  vancomycin (VANCOCIN) 1,500 mg in sodium chloride 0.9 % 500 mL IVPB     1,500 mg 250 mL/hr over 120 Minutes Intravenous Every 24 hours 06/26/18 0446     06/26/18 0600  clindamycin (CLEOCIN) IVPB 600 mg  600 mg 100 mL/hr over 30 Minutes Intravenous Every 8 hours 06/26/18 0508     06/26/18 0415  clindamycin (CLEOCIN) IVPB 300 mg  Status:  Discontinued     300 mg 100 mL/hr over 30 Minutes Intravenous Every 8 hours 06/26/18 0356 06/26/18 0508   06/26/18 0400  cefTRIAXone (ROCEPHIN) 2 g in sodium chloride 0.9 % 100 mL IVPB  Status:  Discontinued     2 g 200 mL/hr over 30 Minutes Intravenous Every 24 hours 06/26/18 0356 06/26/18 0409   06/25/18 2330  vancomycin (VANCOCIN) IVPB 1000 mg/200 mL premix     1,000 mg 200 mL/hr over 60 Minutes Intravenous  Once 06/25/18 1958 06/26/18 0015   06/25/18 1500  clindamycin (CLEOCIN) IVPB 600 mg     600 mg 100 mL/hr over 30 Minutes Intravenous  Once 06/25/18 1453 06/25/18 1657   06/25/18 1330   vancomycin (VANCOCIN) IVPB 1000 mg/200 mL premix     1,000 mg 200 mL/hr over 60 Minutes Intravenous  Once 06/25/18 1318 06/25/18 1606   06/25/18 1330  cefTRIAXone (ROCEPHIN) 2 g in sodium chloride 0.9 % 100 mL IVPB     2 g 200 mL/hr over 30 Minutes Intravenous Every 24 hours 06/25/18 1318         Assessment/Plan Uncontrolled DM - A1c 12.1 HTN HLD CAD H/o STEMI 2011 s/p RCA stent COPD Acute on chronic anemia - Hg 9.4 from 10.3, monitor Urinary retention - foley placed 2/26. Only 500cc UOP last 24 hours. Will give 1L fluid bolus and monitor output today. Repeat BMP in AM  Sepsis Fournier's Gangrene S/p incision and drainage perirectal abscess 2/25 Dr. Maisie Fus S/p EXCISIONAL DEBRIDEMENT 2/27 Dr. Maisie Fus - POD#3 and #1 - intraop cultures: RARE ESCHERICHIA COLI, resistant to cipro only - Continue BID wet to dry dressing (Pack wound with saline dampened kerlex and apply dry ABD pads on top. Pack to base of wound, it tunnels towards inguinal canal and anus). Ok to shower with wound open.  ID -rocephin/clindamycin/vancomycin 2/25>>day#3 VTE -SCDs, sq heparin FEN -IVF, CM diet Foley -placed 2/26 for retention Follow up -TBD   LOS: 3 days    Courtney [name] , [facility] 06/28/2018, 8:33 AM Pager: [phone] Mon-Thurs 7:00 am-4:30 pm Fri 7:00 am -11:30 AM Sat-Sun 7:00 am-11:30 am

## 2018-06-28 NOTE — Progress Notes (Signed)
Night RN gave report stating only 200cc output for shift.   Day RN notified MD. Foley flushed, good urine return.

## 2018-06-28 NOTE — Progress Notes (Signed)
Urine output since 0700. 100cc. MD Adhikari paged. Waiting new orders.

## 2018-06-28 NOTE — Consult Note (Signed)
[facility]    Date of Admission:  06/25/2018           Day 4 vancomycin        Day 4 ceftriaxone        Day 4 clindamycin       Reason for Consult: Fournier's gangrene    Referring Provider: Dr. Burnadette Pop  Assessment: She has acute necrotizing fasciitis compatible with Fournier's gangrene.  As expected, this is a mixed aerobic anaerobic infection.  I will simplify her antibiotic regimen to ceftriaxone and metronidazole.  Her debridements have been down to muscle and bone it is unlikely she has established osteomyelitis.  She should need only a few more days of antibiotic therapy as long as she has good surgical control of her infection.  Plan: 1. Continue IV ceftriaxone 2. Start oral metronidazole 3. Discontinue vancomycin and clindamycin 4. Please call Dr. Judyann Munson [phone]) for any infectious disease questions this weekend  Principal Problem:   Fournier's gangrene in female Active Problems:   HTN (hypertension)   Normocytic anemia   HLD (hyperlipidemia)   Restless leg syndrome   COPD (chronic obstructive pulmonary disease) (HCC)   CAD (coronary artery disease)   OSA and COPD overlap syndrome (HCC)   Pressure injury of skin   DM (diabetes mellitus) (HCC)   Cigarette smoker   Scheduled Meds: . chlorhexidine  15 mL Mouth Rinse BID  . Chlorhexidine Gluconate Cloth  6 each Topical Daily  . heparin injection (subcutaneous)  5,000 Units Subcutaneous Q8H  . insulin aspart  0-5 Units Subcutaneous QHS  . insulin aspart  0-9 Units Subcutaneous TID WC  . insulin glargine  25 Units Subcutaneous Daily  . lisinopril  5 mg Oral Daily  . mouth rinse  15 mL Mouth Rinse q12n4p  . metoprolol succinate  25 mg Oral Daily  . multivitamin with minerals  1 tablet Oral Daily  . nutrition supplement (JUVEN)  1 packet Oral BID BM  . ENSURE MAX PROTEIN  11 oz Oral Daily  . rosuvastatin  20 mg Oral Daily  . sodium chloride flush  10-40 mL  Intracatheter Q12H   Continuous Infusions: . sodium chloride 75 mL/hr at 06/26/18 2046  . cefTRIAXone (ROCEPHIN)  IV 0 g (06/26/18 1504)  . clindamycin (CLEOCIN) IV Stopped (06/28/18 0768)  . lactated ringers 1,000 mL with potassium chloride 20 mEq infusion 100 mL/hr at 06/28/18 0553  . vancomycin Stopped (06/28/18 0038)   PRN Meds:.acetaminophen, hydrALAZINE, ipratropium-albuterol, methocarbamol, morphine injection, oxyCODONE, sodium chloride flush  HPI: Courtney [name] is a 73 y.o. female poorly controlled diabetes, COPD and CAD who developed painful swelling buttock followed by foul-smelling drainage leading to admission 06/25/2018.  CT scan revealed "soft tissue gas and inflammatory process extends from the upper right thigh/right buttock region anteriorly and superiorly impressing upon the right lateral aspect of the rectum and lower cervix" suspicious for necrotizing fasciitis.  She underwent emergent I&D.  She was started on broad empiric antibiotic therapy with vancomycin, ceftriaxone and clindamycin (she has a remote history of facial swelling with penicillin).  She underwent repeat I&D yesterday.  Operative Gram stain showed gram-negative rods and gram-positive cocci.  Cultures have grown E. coli and an anaerobe.  The E. coli is susceptible to ceftriaxone.  She is feeling better.   Review of Systems: Review of Systems  Constitutional: Negative for chills, diaphoresis, fever and weight loss.  Gastrointestinal: Negative for abdominal pain,  diarrhea, nausea and vomiting.  Genitourinary: Negative for dysuria.    Past Medical History:  Diagnosis Date  . Anemia   . Arthritis   . Asthma   . CAD (coronary artery disease)   . COPD (chronic obstructive pulmonary disease) (HCC)   . Diabetes mellitus   . Hyperlipidemia   . Hypertension   . Myocardial infarct (HCC)   . Postmenopausal atrophic vaginitis   . Restless leg syndrome     Social History   Tobacco Use  . Smoking status:  Current Every Day Smoker    Packs/day: 0.50  . Smokeless tobacco: Never Used  Substance Use Topics  . Alcohol use: No  . Drug use: No    Family History  Problem Relation Age of Onset  . Hypertension Brother   . Heart attack Neg Hx    Allergies  Allergen Reactions  . Eggs Or Egg-Derived Products   . Penicillins     Did it involve swelling of the face/tongue/throat, SOB, or low BP? No Did it involve sudden or severe rash/hives, skin peeling, or any reaction on the inside of your mouth or nose? Yes Did you need to seek medical attention at a hospital or doctor's office? yes When did it last happen?more than 10 years If all above answers are "NO", may proceed with cephalosporin use.     OBJECTIVE: Blood pressure (!) 154/72, pulse 94, temperature 98.8 F (37.1 C), resp. rate (!) 21, height 5\' 3"  (1.6 m), weight 95.5 kg, SpO2 98 %.  Physical Exam Constitutional:      Comments: She is resting quietly in bed.  She appears comfortable.       Lab Results Lab Results  Component Value Date   WBC 23.8 (H) 06/28/2018   HGB 9.4 (L) 06/28/2018   HCT 32.1 (L) 06/28/2018   MCV 83.2 06/28/2018   PLT 236 06/28/2018    Lab Results  Component Value Date   CREATININE 0.58 06/28/2018   BUN 13 06/28/2018   NA 137 06/28/2018   K 3.8 06/28/2018   CL 108 06/28/2018   CO2 25 06/28/2018    Lab Results  Component Value Date   ALT 50 (H) 06/25/2018   AST 44 (H) 06/25/2018   ALKPHOS 147 (H) 06/25/2018   BILITOT 0.4 06/25/2018     Microbiology: Recent Results (from the past 240 hour(s))  Culture, blood (routine x 2)     Status: None (Preliminary result)   Collection Time: 06/25/18 12:10 PM  Result Value Ref Range Status   Specimen Description BLOOD LEFT ANTECUBITAL  Final   Special Requests   Final    BOTTLES DRAWN AEROBIC AND ANAEROBIC Blood Culture adequate volume Performed at [facility], [address] [place], [place] [address]    Culture NO  GROWTH 3 DAYS  Final   Report Status PENDING  Incomplete  Culture, blood (routine x 2)     Status: None (Preliminary result)   Collection Time: 06/25/18  2:06 PM  Result Value Ref Range Status   Specimen Description BLOOD LEFT ANTECUBITAL  Final   Special Requests   Final    BOTTLES DRAWN AEROBIC AND ANAEROBIC Blood Culture adequate volume Performed at [facility], [address] [place], [place] [address]    Culture NO GROWTH 3 DAYS  Final   Report Status PENDING  Incomplete  Aerobic/Anaerobic Culture (surgical/deep wound)     Status: None (Preliminary result)   Collection Time: 06/25/18  5:18 PM  Result  Value Ref Range Status   Specimen Description   Final    ABSCESS Performed at [facility], [address], [place], [place] [address]    Special Requests RECTAL  Final   Gram Stain   Final    FEW WBC PRESENT, PREDOMINANTLY PMN ABUNDANT GRAM NEGATIVE RODS ABUNDANT GRAM POSITIVE COCCI    Culture   Final    RARE ESCHERICHIA COLI HOLDING FOR POSSIBLE ANAEROBE Performed at [facility], [address], [place], [place] [address]    Report Status PENDING  Incomplete   Organism ID, Bacteria ESCHERICHIA COLI  Final      Susceptibility   Escherichia coli - MIC*    AMPICILLIN 16 INTERMEDIATE Intermediate     CEFAZOLIN <=4 SENSITIVE Sensitive     CEFEPIME <=1 SENSITIVE Sensitive     CEFTAZIDIME <=1 SENSITIVE Sensitive     CEFTRIAXONE <=1 SENSITIVE Sensitive     CIPROFLOXACIN >=4 RESISTANT Resistant     GENTAMICIN <=1 SENSITIVE Sensitive     IMIPENEM <=0.25 SENSITIVE Sensitive     TRIMETH/SULFA <=20 SENSITIVE Sensitive     AMPICILLIN/SULBACTAM 4 SENSITIVE Sensitive     PIP/TAZO <=4 SENSITIVE Sensitive     Extended ESBL NEGATIVE Sensitive     * RARE ESCHERICHIA COLI  Culture, blood (x 2)     Status: None (Preliminary result)   Collection Time: 06/25/18  7:52 PM  Result Value Ref Range Status   Specimen Description BLOOD ARTERIAL  LINE  Final   Special Requests   Final    BOTTLES DRAWN AEROBIC AND ANAEROBIC Blood Culture adequate volume Performed at [facility], [address] [place], [place] [address]    Culture NO GROWTH 3 DAYS  Final   Report Status PENDING  Incomplete  Culture, blood (x 2)     Status: None (Preliminary result)   Collection Time: 06/25/18  9:34 PM  Result Value Ref Range Status   Specimen Description BLOOD LEFT ARM  Final   Special Requests   Final    BOTTLES DRAWN AEROBIC ONLY Blood Culture results may not be optimal due to an inadequate volume of blood received in culture bottles   Culture NO GROWTH 3 DAYS  Final   Report Status PENDING  Incomplete  MRSA PCR Screening     Status: None   Collection Time: 06/25/18 10:19 PM  Result Value Ref Range Status   MRSA by PCR NEGATIVE NEGATIVE Final    Comment:        The GeneXpert MRSA Assay (FDA approved for NASAL specimens only), is one component of a comprehensive MRSA colonization surveillance program. It is not intended to diagnose MRSA infection nor to guide or monitor treatment for MRSA infections. Performed at [facility], [address] [place] [address], [place], [place] [address]   Urine culture     Status: None   Collection Time: 06/26/18  1:35 PM  Result Value Ref Range Status   Specimen Description   Final    URINE, RANDOM Performed at [facility], [address] [place], [place] [address]    Special Requests   Final    Normal Performed at [facility], [address] [place], [place] [address]    Culture   Final    NO GROWTH Performed at [facility], [address] [place], [place] [address]    Report Status 06/27/2018 FINAL  Final    Cliffton Asters, MD [facility] [phone] pager   [phone] cell 06/28/2018, 12:15 PM

## 2018-06-28 NOTE — Progress Notes (Signed)
Nutrition Follow-up  DOCUMENTATION CODES:   Obesity unspecified  INTERVENTION:  Continue with Juven BID Continue with MVI Increase Ensure Max to BID   NUTRITION DIAGNOSIS:   Increased nutrient needs related to wound healing, acute illness, post-op healing as evidenced by estimated needs.  continues  GOAL:   Patient will meet greater than or equal to 90% of their needs  progressing  MONITOR:   PO intake, Supplement acceptance, Weight trends, Labs, Skin  REASON FOR ASSESSMENT:   Other (Comment)(pressure injury report)    ASSESSMENT:   73 y.o. female with medical history of anemia, arthritis, asthma, COPD, OSA on CPAP, CAD, uncontrolled DM, HLD, MI, RLS, and medical noncompliance. She presented to the ED for evaluation of frequent falls and a boil on her gluteal area. She denied any injuries from falls but has been unable to get up after falling.  She reported generalized weakness. She also reported a boil on her backside for a few days that is has progressively worsened and was severe the night PTA.   Spoke with RN, PT in with patient at time of visit. RN reports patient is eating pretty well and RN has educated patient on the importance of protein intake for wound healing. Per RN, patient does not report having pain unless ambulating from bed to chair.  Medications reviewed and include: SSI, Lantus 25 units daily, MVI, Juven, Ensure Max IVF with KCl 20 mEq  @ 133mL/hr  2/27: OR - debridement  Diet Order:   Diet Order            Diet Carb Modified Fluid consistency: Thin; Room service appropriate? Yes  Diet effective now              EDUCATION NEEDS:   Education needs have been addressed  Skin:  Skin Assessment: Skin Integrity Issues: Skin Integrity Issues:: Stage II Stage II: sacrum x2, R groin  Last BM:  2/25  Height:   Ht Readings from Last 1 Encounters:  06/27/18 5\' 3"  (1.6 m)    Weight:   Wt Readings from Last 1 Encounters:  06/27/18 95.5 kg     Ideal Body Weight:  52.27 kg  BMI:  Body mass index is 37.3 kg/m.  Estimated Nutritional Needs:   Kcal:  1855-2045 kcal  Protein:  80-90 grams  Fluid:  >/= 1.8 L/day    Lars Masson, RD, LDN  After Hours/Weekend Pager: [phone]

## 2018-06-28 NOTE — Progress Notes (Signed)
Physical Therapy Treatment Patient Details Name: Courtney [name] MRN: [id number] DOB: [birth year] Today's Date: 06/28/2018    History of Present Illness 73 y.o. female admitted on 06/25/18 for boil on her left buttock with multiple recent falls.  Pt s/p I and D of fournier's gangrene in perirectal abcess 06/25/18 and repeat I and D  on 06/27/18.  Pt with significant PMH of RLS, MI, HTN, DM, COPD, CAD, and arthritis.      PT Comments    Pt is progressing slowly. Will benefit from SNF post acute. Able to get to chair today with max assist of 2. continue PT POC in acute setting   Follow Up Recommendations  SNF     Equipment Recommendations  None recommended by PT    Recommendations for Other Services       Precautions / Restrictions Precautions Precautions: Fall Precaution Comments: recent h/o 3 falls (once in the ED and two at home).  Restrictions Weight Bearing Restrictions: No    Mobility  Bed Mobility Overal bed mobility: Needs Assistance Bed Mobility: Rolling;Sidelying to Sit;Sit to Supine Rolling: Max assist Sidelying to sit: +2 for physical assistance;+2 for safety/equipment;Mod assist       General bed mobility comments: HOB raised to get to EOB.  assist with bil LEs and with trunk elevation, incr time and multi-modal cues needed  Transfers Overall transfer level: Needs assistance Equipment used: Rolling walker (2 wheeled) Transfers: Stand Pivot Transfers;Sit to/from Stand Sit to Stand: +2 physical assistance;Mod assist;+2 safety/equipment Stand pivot transfers: +2 physical assistance;+2 safety/equipment;Mod assist;Max assist       General transfer comment: +2 to rise and stabilize, incr time and multi-modal cues for wt shift, safety,  hip/trunk extension; small pivotal steps to chair, assist throughout for balance   Ambulation/Gait                 Stairs             Wheelchair Mobility    Modified Rankin (Stroke Patients Only)        Balance   Sitting-balance support: Feet supported;Bilateral upper extremity supported Sitting balance-Leahy Scale: Poor Sitting balance - Comments: tactile cues for midline, requires use of UEs    Standing balance support: Bilateral upper extremity supported Standing balance-Leahy Scale: Poor Standing balance comment: requires support of RW and 2 person assist                             Cognition Arousal/Alertness: Awake/alert Behavior During Therapy: WFL for tasks assessed/performed Overall Cognitive Status: Impaired/Different from baseline Area of Impairment: Problem solving                             Problem Solving: Slow processing;Decreased initiation;Difficulty sequencing;Requires verbal cues;Requires tactile cues General Comments: verbalizes fear of falling      Exercises      General Comments        Pertinent Vitals/Pain Pain Assessment: Faces Faces Pain Scale: Hurts whole lot Pain Location: buttocks Pain Descriptors / Indicators: Guarding;Grimacing Pain Intervention(s): Limited activity within patient's tolerance;Monitored during session;Premedicated before session    Home Living                      Prior Function            PT Goals (current goals can now be found in the care plan section) Acute Rehab PT  Goals Patient Stated Goal: stop falling PT Goal Formulation: With patient/family Time For Goal Achievement: 07/10/18 Potential to Achieve Goals: Good Progress towards PT goals: Progressing toward goals    Frequency    Min 2X/week      PT Plan Current plan remains appropriate;Frequency needs to be updated    Co-evaluation              AM-PAC PT "6 Clicks" Mobility   Outcome Measure  Help needed turning from your back to your side while in a flat bed without using bedrails?: A Lot Help needed moving from lying on your back to sitting on the side of a flat bed without using bedrails?: Total Help needed  moving to and from a bed to a chair (including a wheelchair)?: Total Help needed standing up from a chair using your arms (e.g., wheelchair or bedside chair)?: Total Help needed to walk in hospital room?: Total Help needed climbing 3-5 steps with a railing? : Total 6 Click Score: 7    End of Session Equipment Utilized During Treatment: Gait belt Activity Tolerance: Patient tolerated treatment well;Patient limited by fatigue;Patient limited by pain Patient left: in chair;with call bell/phone within reach;with chair alarm set;with family/visitor present Nurse Communication: Mobility status;Need for lift equipment(left on maxi-sky pad) PT Visit Diagnosis: Muscle weakness (generalized) (M62.81);Difficulty in walking, not elsewhere classified (R26.2);Pain Pain - Right/Left: (glut region)     Time: 3716-9678 PT Time Calculation (min) (ACUTE ONLY): 16 min  Charges:  $Therapeutic Activity: 8-22 mins                     Drucilla Chalet, PT  Pager: [phone] Acute Rehab Dept [facility]): [phone]   06/28/2018    [facility] 06/28/2018, 1:51 PM

## 2018-06-29 LAB — CBC
HCT: 31.1 % — ABNORMAL LOW (ref 36.0–46.0)
Hemoglobin: 9 g/dL — ABNORMAL LOW (ref 12.0–15.0)
MCH: 24.5 pg — ABNORMAL LOW (ref 26.0–34.0)
MCHC: 28.9 g/dL — AB (ref 30.0–36.0)
MCV: 84.7 fL (ref 80.0–100.0)
Platelets: 248 10*3/uL (ref 150–400)
RBC: 3.67 MIL/uL — ABNORMAL LOW (ref 3.87–5.11)
RDW: 14.8 % (ref 11.5–15.5)
WBC: 24.6 10*3/uL — ABNORMAL HIGH (ref 4.0–10.5)
nRBC: 0 % (ref 0.0–0.2)

## 2018-06-29 LAB — AEROBIC/ANAEROBIC CULTURE W GRAM STAIN (SURGICAL/DEEP WOUND)

## 2018-06-29 LAB — BASIC METABOLIC PANEL
Anion gap: 5 (ref 5–15)
BUN: 15 mg/dL (ref 8–23)
CO2: 23 mmol/L (ref 22–32)
Calcium: 8.1 mg/dL — ABNORMAL LOW (ref 8.9–10.3)
Chloride: 110 mmol/L (ref 98–111)
Creatinine, Ser: 0.5 mg/dL (ref 0.44–1.00)
GFR calc Af Amer: >60 mL/min (ref >60)
GFR calc non Af Amer: >60 mL/min (ref >60)
Glucose, Bld: 152 mg/dL — ABNORMAL HIGH (ref 70–99)
Potassium: 4.1 mmol/L (ref 3.5–5.1)
Sodium: 138 mmol/L (ref 135–145)

## 2018-06-29 LAB — GLUCOSE, CAPILLARY
Glucose-Capillary: 145 mg/dL — ABNORMAL HIGH (ref 70–99)
Glucose-Capillary: 213 mg/dL — ABNORMAL HIGH (ref 70–99)
Glucose-Capillary: 284 mg/dL — ABNORMAL HIGH (ref 70–99)
Glucose-Capillary: 306 mg/dL — ABNORMAL HIGH (ref 70–99)

## 2018-06-29 MED ORDER — LISINOPRIL 10 MG PO TABS
10.0000 mg | ORAL_TABLET | Freq: Every day | ORAL | Status: DC
  Administered 2018-06-29: 10 mg via ORAL
  Filled 2018-06-29: qty 1

## 2018-06-29 NOTE — Progress Notes (Signed)
PROGRESS NOTE    Courtney [name]  ZOX:[id number] DOB: 1945/06/06 DOA: 06/25/2018 PCP: Jordan Hawks, PA-C   Brief Narrative: Patient is a 73 year old female with history of uncontrolled type diabetes mellitus, coronary artery disease, OSA on CPAP, asthma, COPD who presents to the emergency department with complaints of generalized weakness, gluteal abscess for several days.  She was found to be septic with [place] gangrene/NF and was taken emergently to the OR.  She underwent  repeat debridement  by general surgery on 06/27/18.ID also consulted for the guidance for antibiotics.  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  Repeat I&D on 2/27.  Currently on ceftriaxone and flagyl.  Negative blood cultures.  So far showing Ecoli and anareobes on wound cultures.  Wound care as per general surgery. IID consultation done  for recommendation on antibiotics. Surgery might plan for further debridement next week.  Stage II pressure ulcers of the right and left medial sacrum/right groin: Present on admission.  Wound care as per general surgery.  Optimize nutritional status.  Uncontrolled type 2 diabetes mellitus: Hemoglobin A1c of 12.1.  Follows with endocrinologist Dr. Jolyne Loa.  Continue Lantus and sliding scale  insulin for now.  Diabetic coordinator  following.  COPD/asthma: Noncompliant with home oxygen.  Continue supplemental oxygen as needed.  Not on any home medications.  Currently COPD is stable.  We will put her on albuterol inhaler on discharge.  Follow-up with pulmonology as an  outpatient.  OSA: Continue CPAP at night.  Coronary artery disease: Continue metoprolol, statin.  Not on aspirin at home.  Hypertension: Continue metoprolol.  Added lisinopril.PRN meds.  Hypertensive this morning.Increased the dose of lisinopril.  Hyperlipidemia: Continue statin  Obesity: BMI of 36.  Dietitian following.  Nutrition Problem: Increased nutrient needs Etiology: wound healing, acute illness, post-op healing      DVT prophylaxis:Heparin Ventana Code Status: Full Family Communication: None present at the bedside Disposition Plan: Home after full  Management,surgical clearance   Consultants: General surgery,ID  Procedures: I&D  Antimicrobials:  Anti-infectives (From admission, onward)   Start     Dose/Rate Route Frequency Ordered Stop   06/28/18 1400  metroNIDAZOLE (FLAGYL) tablet 500 mg     500 mg Oral Every 8 hours 06/28/18 1225     06/26/18 2200  vancomycin (VANCOCIN) 1,500 mg in sodium chloride 0.9 % 500 mL IVPB  Status:  Discontinued     1,500 mg 250 mL/hr over 120 Minutes Intravenous Every 24 hours 06/26/18 0446 06/28/18 1225   06/26/18 0600  clindamycin (CLEOCIN) IVPB 600 mg  Status:  Discontinued     600 mg 100 mL/hr over 30 Minutes Intravenous Every 8 hours 06/26/18 0508 06/28/18 1225   06/26/18 0415  clindamycin (CLEOCIN) IVPB 300 mg  Status:  Discontinued     300 mg 100 mL/hr over 30 Minutes Intravenous Every 8 hours 06/26/18 0356 06/26/18 0508   06/26/18 0400  cefTRIAXone (ROCEPHIN) 2 g in sodium chloride 0.9 % 100 mL IVPB  Status:  Discontinued     2 g 200 mL/hr over 30  Minutes Intravenous Every 24 hours 06/26/18 0356 06/26/18 0409   06/25/18 2330  vancomycin (VANCOCIN) IVPB 1000 mg/200 mL premix     1,000 mg 200 mL/hr over 60 Minutes Intravenous  Once 06/25/18 1958 06/26/18 0015   06/25/18 1500  clindamycin (CLEOCIN) IVPB 600 mg     600 mg 100 mL/hr over 30 Minutes Intravenous  Once 06/25/18 1453 06/25/18 1657   06/25/18 1330  vancomycin  (VANCOCIN) IVPB 1000 mg/200 mL premix     1,000 mg 200 mL/hr over 60 Minutes Intravenous  Once 06/25/18 1318 06/25/18 1606   06/25/18 1330  cefTRIAXone (ROCEPHIN) 2 g in sodium chloride 0.9 % 100 mL IVPB     2 g 200 mL/hr over 30 Minutes Intravenous Every 24 hours 06/25/18 1318        Subjective: Patient seen and examined the bedside this morning.  Mildly hypertensive today.  Looks comfortable.  Pain well controlled.  No new complaints today.  Objective: Vitals:   06/29/18 0300 06/29/18 0400 06/29/18 0500 06/29/18 0600  BP:    (!) 167/62  Pulse:      Resp: (!) 21 20 18  (!) 24  Temp: 99.1 F (37.3 C) 99 F (37.2 C) 98.2 F (36.8 C) 99 F (37.2 C)  TempSrc:      SpO2:      Weight:   102.7 kg   Height:        Intake/Output Summary (Last 24 hours) at 06/29/2018 0840 Last data filed at 06/29/2018 0600 Gross per 24 hour  Intake 5098.56 ml  Output 945 ml  Net 4153.56 ml     Examination: General exam: Appears calm and comfortable ,Not in distress,obese HEENT:PERRL,Oral mucosa moist, Ear/Nose normal on gross exam Respiratory system: Bilateral equal air entry, normal vesicular breath sounds, no wheezes or crackles  Cardiovascular system: S1 & S2 heard, RRR. No JVD, murmurs, rubs, gallops or clicks. Gastrointestinal system: Abdomen is nondistended, soft and nontender. No organomegaly or masses felt. Normal bowel sounds heard. Central nervous system: Alert and oriented. No focal neurological deficits. Extremities: No edema, no clubbing ,no cyanosis, distal peripheral pulses palpable. Skin/GU:Perineal wounds wrapped with dressings.  Tenderness on the perineal , suprapubic region       Data Reviewed: I have personally reviewed following labs and imaging studies  CBC: Recent Labs  Lab 06/25/18 1209 06/25/18 2230 06/26/18 0745 06/27/18 0559 06/28/18 0345 06/29/18 0453  WBC 39.2* 42.3* 38.6* 25.7* 23.8* 24.6*  NEUTROABS 34.2* 40.2*  --   --  22.6*  --   HGB 13.5 11.7*  11.1* 10.3* 9.4* 9.0*  HCT 46.0 39.0 36.6 34.0* 32.1* 31.1*  MCV 83.9 82.8 81.3 81.3 83.2 84.7  PLT 365 217 278 225 236 248   Basic Metabolic Panel: Recent Labs  Lab 06/25/18 1947 06/26/18 0745 06/27/18 0559 06/28/18 0345 06/29/18 0453  NA 137 135 136 137 138  K 2.5* 3.4* 3.6 3.8 4.1  CL 105 102 105 108 110  CO2 22 24 23 25 23   GLUCOSE 71 100* 225* 121* 152*  BUN 23 21 19 13 15   CREATININE 0.59 0.68 0.63 0.58 0.50  CALCIUM 7.5* 8.3* 8.4* 8.1* 8.1*   GFR: Estimated Creatinine Clearance: 70.6 mL/min (by C-G formula based on SCr of 0.5 mg/dL). Liver Function Tests: Recent Labs  Lab 06/25/18 1947  AST 44*  ALT 50*  ALKPHOS 147*  BILITOT 0.4  PROT 5.3*  ALBUMIN 1.9*   No results for input(s): LIPASE, AMYLASE in the last 168 hours. No results  for input(s): AMMONIA in the last 168 hours. Coagulation Profile: Recent Labs  Lab 06/25/18 1859 06/25/18 1947  INR 1.2 1.2   Cardiac Enzymes: Recent Labs  Lab 06/25/18 1209  TROPONINI 0.03*   BNP (last 3 results) No results for input(s): PROBNP in the last 8760 hours. HbA1C: No results for input(s): HGBA1C in the last 72 hours. CBG: Recent Labs  Lab 06/28/18 0727 06/28/18 1132 06/28/18 1631 06/28/18 2138 06/29/18 0729  GLUCAP 110* 163* 237* 193* 145*   Lipid Profile: No results for input(s): CHOL, HDL, LDLCALC, TRIG, CHOLHDL, LDLDIRECT in the last 72 hours. Thyroid Function Tests: No results for input(s): TSH, T4TOTAL, FREET4, T3FREE, THYROIDAB in the last 72 hours. Anemia Panel: No results for input(s): VITAMINB12, FOLATE, FERRITIN, TIBC, IRON, RETICCTPCT in the last 72 hours. Sepsis Labs: Recent Labs  Lab 06/25/18 1211 06/25/18 1554 06/25/18 1947 06/26/18 0100  PROCALCITON  --   --  8.03  --   LATICACIDVEN 5.0* 3.2* 2.7* 1.5    Recent Results (from the past 240 hour(s))  Culture, blood (routine x 2)     Status: None (Preliminary result)   Collection Time: 06/25/18 12:10 PM  Result Value Ref Range  Status   Specimen Description BLOOD LEFT ANTECUBITAL  Final   Special Requests   Final    BOTTLES DRAWN AEROBIC AND ANAEROBIC Blood Culture adequate volume Performed at [facility], [address], [place], [place] [address]    Culture NO GROWTH 3 DAYS  Final   Report Status PENDING  Incomplete  Culture, blood (routine x 2)     Status: None (Preliminary result)   Collection Time: 06/25/18  2:06 PM  Result Value Ref Range Status   Specimen Description BLOOD LEFT ANTECUBITAL  Final   Special Requests   Final    BOTTLES DRAWN AEROBIC AND ANAEROBIC Blood Culture adequate volume Performed at [facility], [address] [place], [place] [address]    Culture NO GROWTH 3 DAYS  Final   Report Status PENDING  Incomplete  Aerobic/Anaerobic Culture (surgical/deep wound)     Status: None (Preliminary result)   Collection Time: 06/25/18  5:18 PM  Result Value Ref Range Status   Specimen Description   Final    ABSCESS Performed at [facility], [address], [place], [place] [address]    Special Requests RECTAL  Final   Gram Stain   Final    FEW WBC PRESENT, PREDOMINANTLY PMN ABUNDANT GRAM NEGATIVE RODS ABUNDANT GRAM POSITIVE COCCI    Culture   Final    RARE ESCHERICHIA COLI HOLDING FOR POSSIBLE ANAEROBE Performed at [facility], [address], [place], [place] [address]    Report Status PENDING  Incomplete   Organism ID, Bacteria ESCHERICHIA COLI  Final      Susceptibility   Escherichia coli - MIC*    AMPICILLIN 16 INTERMEDIATE Intermediate     CEFAZOLIN <=4 SENSITIVE Sensitive     CEFEPIME <=1 SENSITIVE Sensitive     CEFTAZIDIME <=1 SENSITIVE Sensitive     CEFTRIAXONE <=1 SENSITIVE Sensitive     CIPROFLOXACIN >=4 RESISTANT Resistant     GENTAMICIN <=1 SENSITIVE Sensitive     IMIPENEM <=0.25 SENSITIVE Sensitive     TRIMETH/SULFA <=20 SENSITIVE Sensitive     AMPICILLIN/SULBACTAM 4 SENSITIVE Sensitive     PIP/TAZO  <=4 SENSITIVE Sensitive     Extended ESBL NEGATIVE Sensitive     * RARE ESCHERICHIA COLI  Culture, blood (x  2)     Status: None (Preliminary result)   Collection Time: 06/25/18  7:52 PM  Result Value Ref Range Status   Specimen Description BLOOD ARTERIAL LINE  Final   Special Requests   Final    BOTTLES DRAWN AEROBIC AND ANAEROBIC Blood Culture adequate volume Performed at [facility], [address], [place], [place] [address]    Culture NO GROWTH 3 DAYS  Final   Report Status PENDING  Incomplete  Culture, blood (x 2)     Status: None (Preliminary result)   Collection Time: 06/25/18  9:34 PM  Result Value Ref Range Status   Specimen Description BLOOD LEFT ARM  Final   Special Requests   Final    BOTTLES DRAWN AEROBIC ONLY Blood Culture results may not be optimal due to an inadequate volume of blood received in culture bottles   Culture NO GROWTH 3 DAYS  Final   Report Status PENDING  Incomplete  MRSA PCR Screening     Status: None   Collection Time: 06/25/18 10:19 PM  Result Value Ref Range Status   MRSA by PCR NEGATIVE NEGATIVE Final    Comment:        The GeneXpert MRSA Assay (FDA approved for NASAL specimens only), is one component of a comprehensive MRSA colonization surveillance program. It is not intended to diagnose MRSA infection nor to guide or monitor treatment for MRSA infections. Performed at [facility], [address] [place], [place] [address]   Urine culture     Status: None   Collection Time: 06/26/18  1:35 PM  Result Value Ref Range Status   Specimen Description   Final    URINE, RANDOM Performed at [facility], [address] [place], [place] [address]    Special Requests   Final    Normal Performed at [facility], [address], [place], [place] [address]    Culture   Final    NO GROWTH Performed at [facility], [address], [place], [place] [address]      Report Status 06/27/2018 FINAL  Final         Radiology Studies: No results found.      Scheduled Meds: . chlorhexidine  15 mL Mouth Rinse BID  . Chlorhexidine Gluconate Cloth  6 each Topical Daily  . heparin injection (subcutaneous)  5,000 Units Subcutaneous Q8H  . insulin aspart  0-5 Units Subcutaneous QHS  . insulin aspart  0-9 Units Subcutaneous TID WC  . insulin glargine  25 Units Subcutaneous Daily  . lisinopril  10 mg Oral Daily  . mouth rinse  15 mL Mouth Rinse q12n4p  . metoprolol succinate  25 mg Oral Daily  . metroNIDAZOLE  500 mg Oral Q8H  . multivitamin with minerals  1 tablet Oral Daily  . nutrition supplement (JUVEN)  1 packet Oral BID BM  . ENSURE MAX PROTEIN  11 oz Oral BID  . rosuvastatin  20 mg Oral Daily  . sodium chloride flush  10-40 mL Intracatheter Q12H   Continuous Infusions: . sodium chloride 100 mL/hr at 06/29/18 0600  . cefTRIAXone (ROCEPHIN)  IV Stopped (06/28/18 1513)  . lactated ringers 1,000 mL with potassium chloride 20 mEq infusion 100 mL/hr at 06/29/18 0600     LOS: 4 days    Time spent:25 mins. More than 50% of that time was spent in counseling and/or coordination of care.      Burnadette Pop, MD  [facility] Pager [phone]  If 7PM-7AM, please contact night-coverage [email or web address] Password [email or web address] 06/29/2018, 8:40 AM

## 2018-06-29 NOTE — Progress Notes (Signed)
[facility] Progress Note  2 Days Post-Op  Subjective: CC-  Comfortable this morning. Waiting for breakfast. Doing well with dressing changes  Objective: Vital signs in last 24 hours: Temp:  [98.2 F (36.8 C)-99.7 F (37.6 C)] 99 F (37.2 C) (02/29 0600) Pulse Rate:  [94] 94 (02/28 0918) Resp:  [15-38] 24 (02/29 0600) BP: (122-167)/(38-97) 167/62 (02/29 0600) SpO2:  [98 %] 98 % (02/28 1724) Weight:  [102.7 kg] 102.7 kg (02/29 0500) Last BM Date: 06/25/18  Intake/Output from previous day: 02/28 0701 - 02/29 0700 In: 5098.6 [P.O.:600; I.V.:4447.8; IV Piggyback:50.7] Out: 945 [Urine:945] Intake/Output this shift: No intake/output data recorded.  PE: Gen: Alert, NAD, pleasant HEENT: EOM's intact, pupils equal and round Card: RRR Pulm:effort normal Abd: Soft, NT/ND GU: wound tunnels very deep towards right inguinal canal   ,    Lab Results:  Recent Labs    06/28/18 0345 06/29/18 0453  WBC 23.8* 24.6*  HGB 9.4* 9.0*  HCT 32.1* 31.1*  PLT 236 248   BMET Recent Labs    06/28/18 0345 06/29/18 0453  NA 137 138  K 3.8 4.1  CL 108 110  CO2 25 23  GLUCOSE 121* 152*  BUN 13 15  CREATININE 0.58 0.50  CALCIUM 8.1* 8.1*   PT/INR No results for input(s): LABPROT, INR in the last 72 hours. CMP     Component Value Date/Time   NA 138 06/29/2018 0453   NA 142 10/02/2016 0856   NA 142 08/06/2014 0854   K 4.1 06/29/2018 0453   K 4.8 08/06/2014 0854   CL 110 06/29/2018 0453   CL 103 01/26/2012 1242   CO2 23 06/29/2018 0453   CO2 29 08/06/2014 0854   GLUCOSE 152 (H) 06/29/2018 0453   GLUCOSE 372 (H) 08/06/2014 0854   GLUCOSE 122 (H) 01/26/2012 1242   BUN 15 06/29/2018 0453   BUN 11 10/02/2016 0856   BUN 12.1 08/06/2014 0854   CREATININE 0.50 06/29/2018 0453   CREATININE 1.1 08/06/2014 0854   CALCIUM 8.1 (L) 06/29/2018 0453   CALCIUM 10.6 (H) 08/06/2014 0854   PROT 5.3 (L) 06/25/2018 1947   PROT 6.7 10/02/2016 0856   PROT 6.9 08/06/2014  0854   ALBUMIN 1.9 (L) 06/25/2018 1947   ALBUMIN 4.1 10/02/2016 0856   ALBUMIN 3.6 08/06/2014 0854   AST 44 (H) 06/25/2018 1947   AST 12 08/06/2014 0854   ALT 50 (H) 06/25/2018 1947   ALT 15 08/06/2014 0854   ALKPHOS 147 (H) 06/25/2018 1947   ALKPHOS 140 08/06/2014 0854   BILITOT 0.4 06/25/2018 1947   BILITOT 0.2 10/02/2016 0856   BILITOT 0.29 08/06/2014 0854   GFRNONAA >60 06/29/2018 0453   GFRAA >60 06/29/2018 0453   Lipase  No results found for: LIPASE     Studies/Results: No results found.  Anti-infectives: Anti-infectives (From admission, onward)   Start     Dose/Rate Route Frequency Ordered Stop   06/28/18 1400  metroNIDAZOLE (FLAGYL) tablet 500 mg     500 mg Oral Every 8 hours 06/28/18 1225     06/26/18 2200  vancomycin (VANCOCIN) 1,500 mg in sodium chloride 0.9 % 500 mL IVPB  Status:  Discontinued     1,500 mg 250 mL/hr over 120 Minutes Intravenous Every 24 hours 06/26/18 0446 06/28/18 1225   06/26/18 0600  clindamycin (CLEOCIN) IVPB 600 mg  Status:  Discontinued     600 mg 100 mL/hr over 30 Minutes Intravenous Every 8 hours 06/26/18 0508 06/28/18 1225  06/26/18 0415  clindamycin (CLEOCIN) IVPB 300 mg  Status:  Discontinued     300 mg 100 mL/hr over 30 Minutes Intravenous Every 8 hours 06/26/18 0356 06/26/18 0508   06/26/18 0400  cefTRIAXone (ROCEPHIN) 2 g in sodium chloride 0.9 % 100 mL IVPB  Status:  Discontinued     2 g 200 mL/hr over 30 Minutes Intravenous Every 24 hours 06/26/18 0356 06/26/18 0409   06/25/18 2330  vancomycin (VANCOCIN) IVPB 1000 mg/200 mL premix     1,000 mg 200 mL/hr over 60 Minutes Intravenous  Once 06/25/18 1958 06/26/18 0015   06/25/18 1500  clindamycin (CLEOCIN) IVPB 600 mg     600 mg 100 mL/hr over 30 Minutes Intravenous  Once 06/25/18 1453 06/25/18 1657   06/25/18 1330  vancomycin (VANCOCIN) IVPB 1000 mg/200 mL premix     1,000 mg 200 mL/hr over 60 Minutes Intravenous  Once 06/25/18 1318 06/25/18 1606   06/25/18 1330   cefTRIAXone (ROCEPHIN) 2 g in sodium chloride 0.9 % 100 mL IVPB     2 g 200 mL/hr over 30 Minutes Intravenous Every 24 hours 06/25/18 1318         Assessment/Plan Uncontrolled DM - A1c 12.1 HTN HLD CAD H/o STEMI 2011 s/p RCA stent COPD Acute on chronic anemia  Urinary retention - foley placed 2/26.  Sepsis Fournier's Gangrene S/p incision and drainage perirectal abscess 2/25 Dr. Maisie Fus S/p EXCISIONAL DEBRIDEMENT 2/27 Dr. Maisie Fus - POD#4 and #2 - intraop cultures: RARE ESCHERICHIA COLI, resistant to cipro only - Continue BID wet to dry dressing (Pack wound with saline dampened kerlex and apply dry ABD pads on top. Pack to base of wound, it tunnels towards inguinal canal and anus). Ok to shower with wound open. - may need further debridement next week  ID -rocephin/clindamycin/vancomycin 2/25>>day#3 VTE -SCDs, sq heparin FEN -IVF, CM diet Foley -placed 2/26 for retention Follow up -TBD   LOS: 4 days    Courtney Panda, MD  Colorectal and General Surgery [facility] Surgery

## 2018-06-30 LAB — CULTURE, BLOOD (ROUTINE X 2)
Culture: NO GROWTH
Culture: NO GROWTH
Culture: NO GROWTH
Culture: NO GROWTH
Special Requests: ADEQUATE
Special Requests: ADEQUATE
Special Requests: ADEQUATE

## 2018-06-30 LAB — CBC WITH DIFFERENTIAL/PLATELET
ABS IMMATURE GRANULOCYTES: 1.24 10*3/uL — AB (ref 0.00–0.07)
Basophils Absolute: 0.1 10*3/uL (ref 0.0–0.1)
Basophils Relative: 0 %
EOS PCT: 0 %
Eosinophils Absolute: 0.1 10*3/uL (ref 0.0–0.5)
HCT: 31.7 % — ABNORMAL LOW (ref 36.0–46.0)
HEMOGLOBIN: 9.2 g/dL — AB (ref 12.0–15.0)
Immature Granulocytes: 5 %
LYMPHS PCT: 9 %
Lymphs Abs: 2.1 10*3/uL (ref 0.7–4.0)
MCH: 24.4 pg — ABNORMAL LOW (ref 26.0–34.0)
MCHC: 29 g/dL — ABNORMAL LOW (ref 30.0–36.0)
MCV: 84.1 fL (ref 80.0–100.0)
Monocytes Absolute: 1.2 10*3/uL — ABNORMAL HIGH (ref 0.1–1.0)
Monocytes Relative: 5 %
Neutro Abs: 19.8 10*3/uL — ABNORMAL HIGH (ref 1.7–7.7)
Neutrophils Relative %: 81 %
Platelets: 269 10*3/uL (ref 150–400)
RBC: 3.77 MIL/uL — ABNORMAL LOW (ref 3.87–5.11)
RDW: 15 % (ref 11.5–15.5)
WBC: 24.4 10*3/uL — ABNORMAL HIGH (ref 4.0–10.5)
nRBC: 0 % (ref 0.0–0.2)

## 2018-06-30 LAB — GLUCOSE, CAPILLARY
Glucose-Capillary: 251 mg/dL — ABNORMAL HIGH (ref 70–99)
Glucose-Capillary: 221 mg/dL — ABNORMAL HIGH (ref 70–99)
Glucose-Capillary: 261 mg/dL — ABNORMAL HIGH (ref 70–99)
Glucose-Capillary: 213 mg/dL — ABNORMAL HIGH (ref 70–99)

## 2018-06-30 MED ORDER — LISINOPRIL 10 MG PO TABS
20.0000 mg | ORAL_TABLET | Freq: Every day | ORAL | Status: DC
  Filled 2018-06-30: qty 2

## 2018-06-30 MED ORDER — METOPROLOL SUCCINATE ER 50 MG PO TB24
50.0000 mg | ORAL_TABLET | Freq: Every day | ORAL | Status: DC
  Administered 2018-06-30 – 2018-07-08 (×9): 50 mg via ORAL
  Filled 2018-06-30 (×8): qty 2

## 2018-06-30 MED ORDER — ZOLPIDEM TARTRATE 5 MG PO TABS
5.0000 mg | ORAL_TABLET | Freq: Every evening | ORAL | Status: DC | PRN
  Administered 2018-06-30 – 2018-07-07 (×7): 5 mg via ORAL
  Filled 2018-06-30 (×7): qty 1

## 2018-06-30 MED ORDER — LISINOPRIL 10 MG PO TABS
10.0000 mg | ORAL_TABLET | Freq: Every day | ORAL | Status: DC
  Administered 2018-06-30 – 2018-07-08 (×9): 10 mg via ORAL
  Filled 2018-06-30 (×8): qty 1

## 2018-06-30 MED ORDER — AMLODIPINE BESYLATE 10 MG PO TABS
10.0000 mg | ORAL_TABLET | Freq: Every day | ORAL | Status: DC
  Filled 2018-06-30: qty 1

## 2018-06-30 NOTE — Progress Notes (Signed)
PROGRESS NOTE    Courtney [name]  UJW:[id number] DOB: 02-16-1946 DOA: 06/25/2018 PCP: Jordan Hawks, PA-C   Brief Narrative: Patient is a 73 year old female with history of uncontrolled type diabetes mellitus, coronary artery disease, OSA on CPAP, asthma, COPD who presents to the emergency department with complaints of generalized weakness, gluteal abscess for several days.  She was found to be septic with [place] gangrene/NF and was taken emergently to the OR.  She underwent  repeat debridement  by general surgery on 06/27/18.ID also consulted for the guidance for antibiotics.  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  Repeat I&D on 2/27.  Currently on ceftriaxone and flagyl.  Negative blood cultures.  So far showing Ecoli and anareobes on wound cultures.  Wound care as per general surgery. IID consultation done  for recommendation on antibiotics. Surgery might plan for further debridement next week.  Stage II pressure ulcers of the right and left medial sacrum/right groin: Present on admission.  Wound care as per general surgery.  Optimize nutritional status.  Uncontrolled type 2 diabetes mellitus: Hemoglobin A1c of 12.1.  Follows with endocrinologist Dr. Jolyne Loa.  Continue Lantus and sliding scale  insulin for now.  Diabetic coordinator  following.  COPD/asthma: Not on any home medications.  Currently COPD is stable.  We will put her on albuterol inhaler on discharge.  Follow-up with pulmonology as an outpatient.  OSA: Continue CPAP at night.  Coronary artery disease: Continue  metoprolol, statin.  Not on aspirin at home.  Hypertension: Continue metoprolol at increased dose .  Added lisinopril.PRN meds.  BP ok  this morning.  Hyperlipidemia: Continue statin  Obesity: BMI of 36.  Dietitian following.  Nutrition Problem: Increased nutrient needs Etiology: wound healing, acute illness, post-op healing      DVT prophylaxis:Heparin [place] Code Status: Full Family Communication: None present at the bedside Disposition Plan: Home after full  Management,surgical clearance   Consultants: General surgery,ID  Procedures: I&D  Antimicrobials:  Anti-infectives (From admission, onward)   Start     Dose/Rate Route Frequency Ordered Stop   06/28/18 1400  metroNIDAZOLE (FLAGYL) tablet 500 mg     500 mg Oral Every 8 hours 06/28/18 1225     06/26/18 2200  vancomycin (VANCOCIN) 1,500 mg in sodium chloride 0.9 % 500 mL IVPB  Status:  Discontinued     1,500 mg 250 mL/hr over 120 Minutes Intravenous Every 24 hours 06/26/18 0446 06/28/18 1225   06/26/18 0600  clindamycin (CLEOCIN) IVPB 600 mg  Status:  Discontinued     600 mg 100 mL/hr over 30 Minutes Intravenous Every 8 hours 06/26/18 0508 06/28/18 1225   06/26/18 0415  clindamycin (CLEOCIN) IVPB 300 mg  Status:  Discontinued     300 mg 100 mL/hr over 30 Minutes Intravenous Every 8 hours 06/26/18 0356 06/26/18 0508   06/26/18 0400  cefTRIAXone (ROCEPHIN) 2 g in sodium chloride 0.9 % 100 mL IVPB  Status:  Discontinued     2 g 200 mL/hr over 30 Minutes Intravenous Every 24 hours 06/26/18 0356 06/26/18 0409  06/25/18 2330  vancomycin (VANCOCIN) IVPB 1000 mg/200 mL premix     1,000 mg 200 mL/hr over 60 Minutes Intravenous  Once 06/25/18 1958 06/26/18 0015   06/25/18 1500  clindamycin (CLEOCIN) IVPB 600 mg     600 mg 100 mL/hr over 30 Minutes Intravenous  Once 06/25/18 1453 06/25/18 1657   06/25/18 1330  vancomycin (VANCOCIN) IVPB 1000 mg/200 mL premix     1,000 mg 200 mL/hr over 60 Minutes Intravenous  Once 06/25/18 1318  06/25/18 1606   06/25/18 1330  cefTRIAXone (ROCEPHIN) 2 g in sodium chloride 0.9 % 100 mL IVPB     2 g 200 mL/hr over 30 Minutes Intravenous Every 24 hours 06/25/18 1318        Subjective: Patient seen and examined the bedside this morning.  Remains comfortable.  Hemodynamically stable .Pain is well controlled.Patient does not have any complains.  Objective: Vitals:   06/30/18 0400 06/30/18 0500 06/30/18 0600 06/30/18 0657  BP:    (!) 192/73  Pulse:  100 98 (!) 102  Resp: (!) 23 (!) Temp: (!) 96.8 F (36 C) 98.8 F (37.1 C) 98.8 F (37.1 C)   TempSrc:      SpO2:  100% 99% 100%  Weight:      Height:        Intake/Output Summary (Last 24 hours) at 06/30/2018 0820 Last data filed at 06/30/2018 1610 Gross per 24 hour  Intake 2820.18 ml  Output 1700 ml  Net 1120.18 ml     Examination:  General exam: Appears calm and comfortable ,Not in distress,obese HEENT:PERRL,Oral mucosa moist, Ear/Nose normal on gross exam Respiratory system: Bilateral equal air entry, normal vesicular breath sounds, no wheezes or crackles  Cardiovascular system: S1 & S2 heard, RRR. No JVD, murmurs, rubs, gallops or clicks. Gastrointestinal system: Abdomen is nondistended, soft and nontender. No organomegaly or masses felt. Normal bowel sounds heard. Central nervous system: Alert and oriented. No focal neurological deficits. Extremities: No edema, no clubbing ,no cyanosis, distal peripheral pulses palpable. Skin/GU:Perineal wounds wrapped with dressings.          Data Reviewed: I have personally reviewed following labs and imaging studies  CBC: Recent Labs  Lab 06/25/18 1209 06/25/18 2230 06/26/18 0745 06/27/18 0559 06/28/18 0345 06/29/18 0453 06/30/18 0610  WBC 39.2* 42.3* 38.6* 25.7* 23.8* 24.6* 24.4*  NEUTROABS 34.2* 40.2*  --   --  22.6*  --  19.8*  HGB 13.5 11.7* 11.1* 10.3* 9.4* 9.0* 9.2*  HCT 46.0 39.0 36.6 34.0* 32.1* 31.1* 31.7*  MCV 83.9 82.8 81.3 81.3 83.2 84.7 84.1   PLT 365 217 278 225 236 248 269   Basic Metabolic Panel: Recent Labs  Lab 06/25/18 1947 06/26/18 0745 06/27/18 0559 06/28/18 0345 06/29/18 0453  NA 137 135 136 137 138  K 2.5* 3.4* 3.6 3.8 4.1  CL 105 102 105 108 110  CO2 GLUCOSE 71 100* 225* 121* 152*  BUN CREATININE 0.59 0.68 0.63 0.58 0.50  CALCIUM 7.5* 8.3* 8.4* 8.1* 8.1*   GFR: Estimated Creatinine Clearance: 70.6 mL/min (by C-G formula based on SCr of 0.5 mg/dL). Liver Function Tests: Recent Labs  Lab 06/25/18 1947  AST 44*  ALT 50*  ALKPHOS 147*  BILITOT 0.4  PROT 5.3*  ALBUMIN 1.9*   No results for input(s): LIPASE, AMYLASE in the last 168 hours. No results for input(s): AMMONIA in the last 168 hours. Coagulation Profile:  Recent Labs  Lab 06/25/18 1859 06/25/18 1947  INR 1.2 1.2   Cardiac Enzymes: Recent Labs  Lab 06/25/18 1209  TROPONINI 0.03*   BNP (last 3 results) No results for input(s): PROBNP in the last 8760 hours. HbA1C: No results for input(s): HGBA1C in the last 72 hours. CBG: Recent Labs  Lab 06/29/18 0729 06/29/18 1202 06/29/18 1628 06/29/18 2047 06/30/18 0801  GLUCAP 145* 213* 284* 306* 251*   Lipid Profile: No results for input(s): CHOL, HDL, LDLCALC, TRIG, CHOLHDL, LDLDIRECT in the last 72 hours. Thyroid Function Tests: No results for input(s): TSH, T4TOTAL, FREET4, T3FREE, THYROIDAB in the last 72 hours. Anemia Panel: No results for input(s): VITAMINB12, FOLATE, FERRITIN, TIBC, IRON, RETICCTPCT in the last 72 hours. Sepsis Labs: Recent Labs  Lab 06/25/18 1211 06/25/18 1554 06/25/18 1947 06/26/18 0100  PROCALCITON  --   --  8.03  --   LATICACIDVEN 5.0* 3.2* 2.7* 1.5    Recent Results (from the past 240 hour(s))  Culture, blood (routine x 2)     Status: None (Preliminary result)   Collection Time: 06/25/18 12:10 PM  Result Value Ref Range Status   Specimen Description   Final    BLOOD LEFT ANTECUBITAL Performed at [facility], [address] [place], [place] [address]    Special Requests   Final    BOTTLES DRAWN AEROBIC AND ANAEROBIC Blood Culture adequate volume Performed at [facility], [address] [place], [place] [address]    Culture   Final    NO GROWTH 4 DAYS Performed at [facility], [address], [place], [place] [address]    Report Status PENDING  Incomplete  Culture, blood (routine x 2)     Status: None (Preliminary result)   Collection Time: 06/25/18  2:06 PM  Result Value Ref Range Status   Specimen Description   Final    BLOOD LEFT ANTECUBITAL Performed at [facility], [address] [place], [place] [address]    Special Requests   Final    BOTTLES DRAWN AEROBIC AND ANAEROBIC Blood Culture adequate volume Performed at [facility], [address], [place], [place] [address]    Culture   Final    NO GROWTH 4 DAYS Performed at [facility], [address], [place], [place] [address]    Report Status PENDING  Incomplete  Aerobic/Anaerobic Culture (surgical/deep wound)     Status: None   Collection Time: 06/25/18  5:18 PM  Result Value Ref Range Status   Specimen Description   Final    ABSCESS Performed at [facility], [address], [place], [place] [address]    Special Requests RECTAL  Final   Gram Stain   Final    FEW WBC PRESENT, PREDOMINANTLY PMN ABUNDANT GRAM NEGATIVE RODS ABUNDANT GRAM POSITIVE COCCI Performed at [facility], [address], [place], [place] [address]    Culture   Final    RARE ESCHERICHIA COLI MIXED ANAEROBIC FLORA PRESENT.  CALL LAB IF FURTHER IID REQUIRED.    Report Status 06/29/2018 FINAL  Final   Organism ID, Bacteria ESCHERICHIA COLI  Final      Susceptibility   Escherichia coli - MIC*    AMPICILLIN 16 INTERMEDIATE Intermediate     CEFAZOLIN <=4 SENSITIVE Sensitive     CEFEPIME <=1 SENSITIVE Sensitive     CEFTAZIDIME <=1  SENSITIVE Sensitive     CEFTRIAXONE <=1 SENSITIVE Sensitive  CIPROFLOXACIN >=4 RESISTANT Resistant     GENTAMICIN <=1 SENSITIVE Sensitive     IMIPENEM <=0.25 SENSITIVE Sensitive     TRIMETH/SULFA <=20 SENSITIVE Sensitive     AMPICILLIN/SULBACTAM 4 SENSITIVE Sensitive     PIP/TAZO <=4 SENSITIVE Sensitive     Extended ESBL NEGATIVE Sensitive     * RARE ESCHERICHIA COLI  Culture, blood (x 2)     Status: None (Preliminary result)   Collection Time: 06/25/18  7:52 PM  Result Value Ref Range Status   Specimen Description   Final    BLOOD ARTERIAL LINE Performed at [facility], [address] [place], [place] [address]    Special Requests   Final    BOTTLES DRAWN AEROBIC AND ANAEROBIC Blood Culture adequate volume Performed at [facility], [address] [place], [place] [address]    Culture   Final    NO GROWTH 4 DAYS Performed at [facility], [address] [place], [place] [address]    Report Status PENDING  Incomplete  Culture, blood (x 2)     Status: None (Preliminary result)   Collection Time: 06/25/18  9:34 PM  Result Value Ref Range Status   Specimen Description BLOOD LEFT ARM  Final   Special Requests   Final    BOTTLES DRAWN AEROBIC ONLY Blood Culture results may not be optimal due to an inadequate volume of blood received in culture bottles   Culture   Final    NO GROWTH 4 DAYS Performed at [facility], [address] [place], [place] [address]    Report Status PENDING  Incomplete  MRSA PCR Screening     Status: None   Collection Time: 06/25/18 10:19 PM  Result Value Ref Range Status   MRSA by PCR NEGATIVE NEGATIVE Final    Comment:        The GeneXpert MRSA Assay (FDA approved for NASAL specimens only), is one component of a comprehensive MRSA colonization surveillance program. It is not intended to diagnose MRSA infection nor to guide or monitor treatment for MRSA infections. Performed at [facility], [address], [place], [place] [address]   Urine culture     Status: None   Collection Time: 06/26/18  1:35 PM  Result Value Ref Range Status   Specimen Description   Final    URINE, RANDOM Performed at [facility], [address] [place], [place] [address]    Special Requests   Final    Normal Performed at [facility], [address], [place], [place] [address]    Culture   Final    NO GROWTH Performed at [facility], [address], [place], [place] [address]    Report Status 06/27/2018 FINAL  Final         Radiology Studies: No results found.      Scheduled Meds: . chlorhexidine  15 mL Mouth Rinse BID  . Chlorhexidine Gluconate Cloth  6 each Topical Daily  . heparin injection (subcutaneous)  5,000 Units Subcutaneous Q8H  . insulin aspart  0-5 Units Subcutaneous QHS  . insulin aspart  0-9 Units Subcutaneous TID WC  . insulin glargine  25 Units Subcutaneous Daily  . lisinopril  10 mg Oral Daily  . mouth rinse  15 mL Mouth Rinse q12n4p  . metoprolol succinate  50 mg Oral Daily  . metroNIDAZOLE  500 mg Oral Q8H  . multivitamin with minerals  1 tablet Oral Daily  . nutrition supplement (JUVEN)  1 packet Oral BID BM  . ENSURE MAX PROTEIN  11 oz Oral BID  . rosuvastatin  20 mg Oral Daily  . sodium chloride flush  10-40 mL Intracatheter Q12H   Continuous Infusions: . cefTRIAXone (ROCEPHIN)  IV Stopped (06/29/18 1629)  . lactated ringers 1,000 mL with potassium chloride 20 mEq infusion 100 mL/hr at 06/29/18 1800     LOS: 5 days    Time spent:25 mins. More than 50% of that time was spent in counseling and/or coordination of care.      Burnadette Pop, MD [facility] Pager [phone]  If 7PM-7AM, please contact night-coverage [email or web address] Password [email or web address] 06/30/2018, 8:20 AM

## 2018-06-30 NOTE — Progress Notes (Signed)
[facility] Progress Note  3 Days Post-Op  Subjective: CC-  Comfortable this morning.  Doing well with dressing changes per pt  Objective: Vital signs in last 24 hours: Temp:  [96.8 F (36 C)-99.9 F (37.7 C)] 98.8 F (37.1 C) (03/01 0600) Pulse Rate:  [97-102] 102 (03/01 0657) Resp:  [14-31] 20 (03/01 0657) BP: (140-192)/(66-122) 192/73 (03/01 0657) SpO2:  [97 %-100 %] 100 % (03/01 0657) Last BM Date: 06/30/18  Intake/Output from previous day: 02/29 0701 - 03/01 0700 In: 3220.1 [P.O.:300; I.V.:2826.2; IV Piggyback:93.9] Out: 1700 [Urine:1700] Intake/Output this shift: No intake/output data recorded.  PE: Gen: Alert, NAD, pleasant HEENT: EOM's intact, pupils equal and round Card: RRR Pulm:effort normal Abd: Soft, NT/ND GU: wound tunnels very deep towards right inguinal canal   ,   Lab Results:  Recent Labs    06/29/18 0453 06/30/18 0610  WBC 24.6* 24.4*  HGB 9.0* 9.2*  HCT 31.1* 31.7*  PLT 248 269   BMET Recent Labs    06/28/18 0345 06/29/18 0453  NA 137 138  K 3.8 4.1  CL 108 110  CO2 25 23  GLUCOSE 121* 152*  BUN 13 15  CREATININE 0.58 0.50  CALCIUM 8.1* 8.1*   PT/INR No results for input(s): LABPROT, INR in the last 72 hours. CMP     Component Value Date/Time   NA 138 06/29/2018 0453   NA 142 10/02/2016 0856   NA 142 08/06/2014 0854   K 4.1 06/29/2018 0453   K 4.8 08/06/2014 0854   CL 110 06/29/2018 0453   CL 103 01/26/2012 1242   CO2 23 06/29/2018 0453   CO2 29 08/06/2014 0854   GLUCOSE 152 (H) 06/29/2018 0453   GLUCOSE 372 (H) 08/06/2014 0854   GLUCOSE 122 (H) 01/26/2012 1242   BUN 15 06/29/2018 0453   BUN 11 10/02/2016 0856   BUN 12.1 08/06/2014 0854   CREATININE 0.50 06/29/2018 0453   CREATININE 1.1 08/06/2014 0854   CALCIUM 8.1 (L) 06/29/2018 0453   CALCIUM 10.6 (H) 08/06/2014 0854   PROT 5.3 (L) 06/25/2018 1947   PROT 6.7 10/02/2016 0856   PROT 6.9 08/06/2014 0854   ALBUMIN 1.9 (L) 06/25/2018 1947   ALBUMIN 4.1 10/02/2016 0856   ALBUMIN 3.6 08/06/2014 0854   AST 44 (H) 06/25/2018 1947   AST 12 08/06/2014 0854   ALT 50 (H) 06/25/2018 1947   ALT 15 08/06/2014 0854   ALKPHOS 147 (H) 06/25/2018 1947   ALKPHOS 140 08/06/2014 0854   BILITOT 0.4 06/25/2018 1947   BILITOT 0.2 10/02/2016 0856   BILITOT 0.29 08/06/2014 0854   GFRNONAA >60 06/29/2018 0453   GFRAA >60 06/29/2018 0453   Lipase  No results found for: LIPASE     Studies/Results: No results found.  Anti-infectives: Anti-infectives (From admission, onward)   Start     Dose/Rate Route Frequency Ordered Stop   06/28/18 1400  metroNIDAZOLE (FLAGYL) tablet 500 mg     500 mg Oral Every 8 hours 06/28/18 1225     06/26/18 2200  vancomycin (VANCOCIN) 1,500 mg in sodium chloride 0.9 % 500 mL IVPB  Status:  Discontinued     1,500 mg 250 mL/hr over 120 Minutes Intravenous Every 24 hours 06/26/18 0446 06/28/18 1225   06/26/18 0600  clindamycin (CLEOCIN) IVPB 600 mg  Status:  Discontinued     600 mg 100 mL/hr over 30 Minutes Intravenous Every 8 hours 06/26/18 0508 06/28/18 1225   06/26/18 0415  clindamycin (CLEOCIN) IVPB 300 mg  Status:  Discontinued     300 mg 100 mL/hr over 30 Minutes Intravenous Every 8 hours 06/26/18 0356 06/26/18 0508   06/26/18 0400  cefTRIAXone (ROCEPHIN) 2 g in sodium chloride 0.9 % 100 mL IVPB  Status:  Discontinued     2 g 200 mL/hr over 30 Minutes Intravenous Every 24 hours 06/26/18 0356 06/26/18 0409   06/25/18 2330  vancomycin (VANCOCIN) IVPB 1000 mg/200 mL premix     1,000 mg 200 mL/hr over 60 Minutes Intravenous  Once 06/25/18 1958 06/26/18 0015   06/25/18 1500  clindamycin (CLEOCIN) IVPB 600 mg     600 mg 100 mL/hr over 30 Minutes Intravenous  Once 06/25/18 1453 06/25/18 1657   06/25/18 1330  vancomycin (VANCOCIN) IVPB 1000 mg/200 mL premix     1,000 mg 200 mL/hr over 60 Minutes Intravenous  Once 06/25/18 1318 06/25/18 1606   06/25/18 1330  cefTRIAXone (ROCEPHIN) 2 g in sodium chloride 0.9 %  100 mL IVPB     2 g 200 mL/hr over 30 Minutes Intravenous Every 24 hours 06/25/18 1318         Assessment/Plan Uncontrolled DM - A1c 12.1 HTN HLD CAD H/o STEMI 2011 s/p RCA stent COPD Acute on chronic anemia  Urinary retention - foley placed 2/26.  Sepsis Fournier's Gangrene S/p incision and drainage perirectal abscess 2/25 Dr. Maisie Fus S/p EXCISIONAL DEBRIDEMENT 2/27 Dr. Maisie Fus - POD#5 and #3 - intraop cultures: RARE ESCHERICHIA COLI, resistant to cipro only - Continue BID wet to dry dressing (Pack wound with saline dampened kerlex and apply dry ABD pads on top. Pack to base of wound, it tunnels towards inguinal canal and anus). Ok to shower with wound open. - may need further debridement next week  ID -rocephin/clindamycin/vancomycin 2/25>>day#5 VTE -SCDs, sq heparin FEN -IVF, CM diet Foley -placed 2/26 for retention but now out Follow up -TBD   LOS: 5 days    Courtney Panda, MD  Colorectal and General Surgery [facility]

## 2018-06-30 NOTE — NC FL2 (Signed)
[place] MEDICAID FL2 LEVEL OF CARE SCREENING TOOL     IDENTIFICATION  Patient Name: Courtney [name] Birthdate: [birth year] Sex: female Admission Date (Current Location): 06/25/2018  [facility] and [place] Number:  Producer, television/film/video and Address:  [facility],  [address], [place] [address]      Provider Number: [id number]  Attending Physician Name and Address:  Burnadette Pop, MD  Relative Name and Phone Number:       Current Level of Care: Hospital Recommended Level of Care: Skilled Nursing Facility Prior Approval Number:    Date Approved/Denied:   PASRR Number: [id number]  Discharge Plan: SNF    Current Diagnoses: Patient Active Problem List   Diagnosis Date Noted  . DM (diabetes mellitus) (HCC) 06/28/2018  . Cigarette smoker 06/28/2018  . OSA and COPD overlap syndrome (HCC) 06/26/2018  . Pressure injury of skin 06/26/2018  . Fournier's gangrene in female 06/25/2018  . Hypertensive heart disease 08/21/2016  . HTN (hypertension)   . Normocytic anemia   . Old MI (myocardial infarction)   . HLD (hyperlipidemia)   . Postmenopausal atrophic vaginitis   . Restless leg syndrome   . COPD (chronic obstructive pulmonary disease) (HCC)   . Arthritis   . CAD (coronary artery disease)   . Iron (Fe) deficiency anemia 04/05/2011    Orientation RESPIRATION BLADDER Height & Weight     Self, Time, Situation, Place  Normal Incontinent, Indwelling catheter Weight: 226 lb 6.6 oz (102.7 kg) Height:  5\' 3"  (160 cm)  BEHAVIORAL SYMPTOMS/MOOD NEUROLOGICAL BOWEL NUTRITION STATUS      Continent Diet(See dc summary)  AMBULATORY STATUS COMMUNICATION OF NEEDS Skin   Extensive Assist Verbally PU Stage and Appropriate Care(Incision Perieum Right)   PU Stage 2 Dressing: (Sacrum right medial, Groin right medial)                   Personal Care Assistance Level of Assistance  Bathing, Feeding, Dressing Bathing Assistance: Limited assistance Feeding assistance:  Independent Dressing Assistance: Limited assistance     Functional Limitations Info  Sight, Hearing, Speech Sight Info: Adequate Hearing Info: Adequate Speech Info: Adequate    SPECIAL CARE FACTORS FREQUENCY  PT (By licensed PT), OT (By licensed OT)     PT Frequency: 5x/week OT Frequency: 5x/week            Contractures Contractures Info: Not present    Additional Factors Info  Code Status, Allergies Code Status Info: Full Allergies Info: Eggs Or Egg-derived Products, Penicillins           Current Medications (06/30/2018):  This is the current hospital active medication list Current Facility-Administered Medications  Medication Dose Route Frequency Provider Last Rate Last Dose  . acetaminophen (TYLENOL) tablet 650 mg  650 mg Oral Q6H PRN Romie Levee, MD   650 mg at 06/26/18 2109  . cefTRIAXone (ROCEPHIN) 2 g in sodium chloride 0.9 % 100 mL IVPB  2 g Intravenous Q24H Romie Levee, MD 200 mL/hr at 06/30/18 1412 2 g at 06/30/18 1412  . chlorhexidine (PERIDEX) 0.12 % solution 15 mL  15 mL Mouth Rinse BID Romie Levee, MD   15 mL at 06/30/18 0851  . Chlorhexidine Gluconate Cloth 2 % PADS 6 each  6 each Topical Daily Burnadette Pop, MD   6 each at 06/30/18 0854  . heparin injection 5,000 Units  5,000 Units Subcutaneous Q8H Romie Levee, MD   5,000 Units at 06/30/18 1407  . hydrALAZINE (APRESOLINE) injection 10 mg  10 mg Intravenous Q2H PRN Romie Levee, MD   10 mg at 06/26/18 2022  . insulin aspart (novoLOG) injection 0-5 Units  0-5 Units Subcutaneous QHS Romie Levee, MD   4 Units at 06/29/18 2105  . insulin aspart (novoLOG) injection 0-9 Units  0-9 Units Subcutaneous TID WC Romie Levee, MD   3 Units at 06/30/18 1245  . insulin glargine (LANTUS) injection 25 Units  25 Units Subcutaneous Daily Romie Levee, MD   25 Units at 06/30/18 0900  . ipratropium-albuterol (DUONEB) 0.5-2.5 (3) MG/3ML nebulizer solution 3 mL  3 mL Nebulization Q6H PRN Romie Levee, MD       . lactated ringers 1,000 mL with potassium chloride 20 mEq infusion   Intravenous Continuous Romie Levee, MD 100 mL/hr at 06/30/18 1309    . lisinopril (PRINIVIL,ZESTRIL) tablet 10 mg  10 mg Oral Daily Burnadette Pop, MD   10 mg at 06/30/18 0852  . MEDLINE mouth rinse  15 mL Mouth Rinse q12n4p Romie Levee, MD   15 mL at 06/30/18 1236  . methocarbamol (ROBAXIN) tablet 500 mg  500 mg Oral Q6H PRN Romie Levee, MD      . metoprolol succinate (TOPROL-XL) 24 hr tablet 50 mg  50 mg Oral Daily Burnadette Pop, MD   50 mg at 06/30/18 0851  . metroNIDAZOLE (FLAGYL) tablet 500 mg  500 mg Oral Q8H Cliffton Asters, MD   500 mg at 06/30/18 1412  . morphine 2 MG/ML injection 1-2 mg  1-2 mg Intravenous Q3H PRN Romie Levee, MD   2 mg at 06/30/18 0854  . multivitamin with minerals tablet 1 tablet  1 tablet Oral Daily Romie Levee, MD   1 tablet at 06/30/18 0853  . nutrition supplement (JUVEN) (JUVEN) powder packet 1 packet  1 packet Oral BID BM Romie Levee, MD   1 packet at 06/30/18 1408  . oxyCODONE (Oxy IR/ROXICODONE) immediate release tablet 5 mg  5 mg Oral Q4H PRN Romie Levee, MD   5 mg at 06/28/18 2141  . protein supplement (ENSURE MAX) liquid  11 oz Oral BID Burnadette Pop, MD   11 oz at 06/30/18 1036  . rosuvastatin (CRESTOR) tablet 20 mg  20 mg Oral Daily Romie Levee, MD   20 mg at 06/30/18 0853  . sodium chloride flush (NS) 0.9 % injection 10-40 mL  10-40 mL Intracatheter Q12H Romie Levee, MD   10 mL at 06/30/18 1408  . sodium chloride flush (NS) 0.9 % injection 10-40 mL  10-40 mL Intracatheter PRN Romie Levee, MD         Discharge Medications: Please see discharge summary for a list of discharge medications.  Relevant Imaging Results:  Relevant Lab Results:   Additional Information SSN: [id number]  Coralyn Helling, LCSW

## 2018-06-30 NOTE — Clinical Social Work Note (Signed)
Clinical Social Work Assessment  Patient Details  Name: Courtney [name] MRN: [id number] Date of Birth: [birth year]  Date of referral:  06/30/18               Reason for consult:  Facility Placement                Permission sought to share information with:  Facility Sport and exercise psychologist, Family Supports Permission granted to share information::  Yes, Verbal Permission Granted  Name::     Set designer::  SNF  Relationship::  Spouse  Contact Information:     Housing/Transportation Living arrangements for the past 2 months:  [place] of Information:  Patient, Spouse Patient Interpreter Needed:  None Criminal Activity/Legal Involvement Pertinent to Current Situation/Hospitalization:  No - Comment as needed Significant Relationships:  Spouse Lives with:  Spouse Do you feel safe going back to the place where you live?  Yes Need for family participation in patient care:  Yes (Comment)  Care giving concerns:  No care giving concerns at the time of assessment.    Social Worker assessment / plan:  LCSW consulted for SNF placement.  LCSW met with patient at bedside. Patients spouse, Roderic Scarce is present. LCSW explained role and reason for visit. Patient is agreeable to SNF for rehab. Patients spouse is apprehensive and asked questions. LCSW answered questions.   Patient reports she went to CIR years ago after she had a heart attack. At baseline patient reports she has trouble walking and reports multiple falls. She has a walker at home but does not use it. Patient reports that she bathes at the sink because she is not able to get in and out of the tub/shower. Patient states she can transfer and get in and out of bed independently.   PLAN: SNF at dc.    Employment status:  Retired Forensic scientist:  Medicare PT Recommendations:  [place] / Referral to community resources:     Patient/Family's Response to care:  Patient is thankful  for LCSW visit and coordination with dc planning.   Patient/Family's Understanding of and Emotional Response to Diagnosis, Current Treatment, and Prognosis:  Patient is realistic about her current condition and agreeable to SNF at dc. Pts spouse is apprehensive stating, " I know she needs to get better, but I don't know about these places."   Emotional Assessment Appearance:  Appears stated age Attitude/Demeanor/Rapport:  Engaged Affect (typically observed):  Accepting, Calm Orientation:  Oriented to Self, Oriented to Place, Oriented to  Time, Oriented to Situation Alcohol / Substance use:  Not Applicable Psych involvement (Current and /or in the community):  No (Comment)  Discharge Needs  Concerns to be addressed:  No discharge needs identified Readmission within the last 30 days:  No Current discharge risk:  None Barriers to Discharge:  Continued Medical Work up   [employer], LCSW 06/30/2018, 2:32 PM

## 2018-07-01 ENCOUNTER — Encounter (HOSPITAL_COMMUNITY)
Admission: EM | Disposition: A | Discharge status: Other Acute Inpatient Hospital | Payer: Self-pay | Source: Home / Self Care | Attending: Internal Medicine

## 2018-07-01 ENCOUNTER — Encounter (HOSPITAL_COMMUNITY): Payer: Self-pay | Admitting: *Deleted

## 2018-07-01 ENCOUNTER — Inpatient Hospital Stay (HOSPITAL_COMMUNITY): Payer: Medicare Other | Admitting: Certified Registered"

## 2018-07-01 DIAGNOSIS — E118 Type 2 diabetes mellitus with unspecified complications: Secondary | ICD-10-CM

## 2018-07-01 DIAGNOSIS — A419 Sepsis, unspecified organism: Secondary | ICD-10-CM | POA: Diagnosis present

## 2018-07-01 DIAGNOSIS — I10 Essential (primary) hypertension: Secondary | ICD-10-CM

## 2018-07-01 HISTORY — PX: IRRIGATION AND DEBRIDEMENT ABSCESS: SHX5252

## 2018-07-01 LAB — BASIC METABOLIC PANEL
Anion gap: 7 (ref 5–15)
BUN: 12 mg/dL (ref 8–23)
CO2: 25 mmol/L (ref 22–32)
Calcium: 8.5 mg/dL — ABNORMAL LOW (ref 8.9–10.3)
Chloride: 103 mmol/L (ref 98–111)
Creatinine, Ser: 0.51 mg/dL (ref 0.44–1.00)
GFR calc Af Amer: >60 mL/min (ref >60)
GFR calc non Af Amer: >60 mL/min (ref >60)
Glucose, Bld: 166 mg/dL — ABNORMAL HIGH (ref 70–99)
Potassium: 3.8 mmol/L (ref 3.5–5.1)
Sodium: 135 mmol/L (ref 135–145)

## 2018-07-01 LAB — GLUCOSE, CAPILLARY
Glucose-Capillary: 165 mg/dL — ABNORMAL HIGH (ref 70–99)
Glucose-Capillary: 110 mg/dL — ABNORMAL HIGH (ref 70–99)
Glucose-Capillary: 131 mg/dL — ABNORMAL HIGH (ref 70–99)
Glucose-Capillary: 150 mg/dL — ABNORMAL HIGH (ref 70–99)

## 2018-07-01 LAB — CBC WITH DIFFERENTIAL/PLATELET
Abs Immature Granulocytes: 1.36 10*3/uL — ABNORMAL HIGH (ref 0.00–0.07)
Basophils Absolute: 0.1 10*3/uL (ref 0.0–0.1)
Basophils Relative: 0 %
Eosinophils Absolute: 0 10*3/uL (ref 0.0–0.5)
Eosinophils Relative: 0 %
HCT: 30.9 % — ABNORMAL LOW (ref 36.0–46.0)
Hemoglobin: 9.3 g/dL — ABNORMAL LOW (ref 12.0–15.0)
Immature Granulocytes: 5 %
Lymphocytes Relative: 10 %
Lymphs Abs: 2.6 10*3/uL (ref 0.7–4.0)
MCH: 24.9 pg — ABNORMAL LOW (ref 26.0–34.0)
MCHC: 30.1 g/dL (ref 30.0–36.0)
MCV: 82.6 fL (ref 80.0–100.0)
MONOS PCT: 5 %
Monocytes Absolute: 1.4 10*3/uL — ABNORMAL HIGH (ref 0.1–1.0)
Neutro Abs: 22 10*3/uL — ABNORMAL HIGH (ref 1.7–7.7)
Neutrophils Relative %: 80 %
Platelets: 266 10*3/uL (ref 150–400)
RBC: 3.74 MIL/uL — ABNORMAL LOW (ref 3.87–5.11)
RDW: 15 % (ref 11.5–15.5)
WBC: 27.5 10*3/uL — ABNORMAL HIGH (ref 4.0–10.5)
nRBC: 0.1 % (ref 0.0–0.2)

## 2018-07-01 SURGERY — IRRIGATION AND DEBRIDEMENT ABSCESS
Anesthesia: General

## 2018-07-01 MED ORDER — SUGAMMADEX SODIUM 200 MG/2ML IV SOLN
INTRAVENOUS | Status: AC
  Filled 2018-07-01: qty 2

## 2018-07-01 MED ORDER — FENTANYL CITRATE (PF) 100 MCG/2ML IJ SOLN
INTRAMUSCULAR | Status: AC
  Filled 2018-07-01: qty 2

## 2018-07-01 MED ORDER — LABETALOL HCL 5 MG/ML IV SOLN
10.0000 mg | INTRAVENOUS | Status: DC | PRN
  Administered 2018-07-01: 10 mg via INTRAVENOUS

## 2018-07-01 MED ORDER — LACTATED RINGERS IV SOLN
INTRAVENOUS | Status: DC | PRN
  Administered 2018-07-01: 11:00:00 via INTRAVENOUS

## 2018-07-01 MED ORDER — LIDOCAINE 2% (20 MG/ML) 5 ML SYRINGE
INTRAMUSCULAR | Status: AC
  Filled 2018-07-01: qty 5

## 2018-07-01 MED ORDER — PHENYLEPHRINE 40 MCG/ML (10ML) SYRINGE FOR IV PUSH (FOR BLOOD PRESSURE SUPPORT)
PREFILLED_SYRINGE | INTRAVENOUS | Status: DC | PRN
  Administered 2018-07-01: 40 ug via INTRAVENOUS

## 2018-07-01 MED ORDER — DEXAMETHASONE SODIUM PHOSPHATE 10 MG/ML IJ SOLN
INTRAMUSCULAR | Status: AC
  Filled 2018-07-01: qty 1

## 2018-07-01 MED ORDER — LABETALOL HCL 5 MG/ML IV SOLN
INTRAVENOUS | Status: AC
  Filled 2018-07-01: qty 4

## 2018-07-01 MED ORDER — PHENYLEPHRINE 40 MCG/ML (10ML) SYRINGE FOR IV PUSH (FOR BLOOD PRESSURE SUPPORT)
PREFILLED_SYRINGE | INTRAVENOUS | Status: AC
  Filled 2018-07-01: qty 10

## 2018-07-01 MED ORDER — FENTANYL CITRATE (PF) 100 MCG/2ML IJ SOLN
25.0000 ug | INTRAMUSCULAR | Status: DC | PRN
  Administered 2018-07-01 (×2): 25 ug via INTRAVENOUS
  Administered 2018-07-01: 50 ug via INTRAVENOUS

## 2018-07-01 MED ORDER — METOCLOPRAMIDE HCL 5 MG/ML IJ SOLN: 10.0000 mg | Freq: Once | INTRAMUSCULAR | Status: DC | PRN

## 2018-07-01 MED ORDER — FENTANYL CITRATE (PF) 250 MCG/5ML IJ SOLN
INTRAMUSCULAR | Status: DC | PRN
  Administered 2018-07-01 (×3): 50 ug via INTRAVENOUS

## 2018-07-01 MED ORDER — PROPOFOL 10 MG/ML IV BOLUS
INTRAVENOUS | Status: DC | PRN
  Administered 2018-07-01: 20 mg via INTRAVENOUS

## 2018-07-01 MED ORDER — MEPERIDINE HCL 50 MG/ML IJ SOLN: 6.2500 mg | INTRAMUSCULAR | Status: DC | PRN

## 2018-07-01 MED ORDER — SUGAMMADEX SODIUM 200 MG/2ML IV SOLN
INTRAVENOUS | Status: DC | PRN
  Administered 2018-07-01: 200 mg via INTRAVENOUS

## 2018-07-01 MED ORDER — ONDANSETRON HCL 4 MG/2ML IJ SOLN
INTRAMUSCULAR | Status: DC | PRN
  Administered 2018-07-01: 4 mg via INTRAVENOUS

## 2018-07-01 MED ORDER — ONDANSETRON HCL 4 MG/2ML IJ SOLN
INTRAMUSCULAR | Status: AC
  Filled 2018-07-01: qty 2

## 2018-07-01 MED ORDER — ROCURONIUM BROMIDE 10 MG/ML (PF) SYRINGE
PREFILLED_SYRINGE | INTRAVENOUS | Status: DC | PRN
  Administered 2018-07-01: 50 mg via INTRAVENOUS

## 2018-07-01 MED ORDER — DEXAMETHASONE SODIUM PHOSPHATE 10 MG/ML IJ SOLN
INTRAMUSCULAR | Status: DC | PRN
  Administered 2018-07-01: 4 mg via INTRAVENOUS

## 2018-07-01 MED ORDER — ENOXAPARIN SODIUM 40 MG/0.4ML ~~LOC~~ SOLN
40.0000 mg | SUBCUTANEOUS | Status: DC
  Administered 2018-07-01 – 2018-07-07 (×7): 40 mg via SUBCUTANEOUS
  Filled 2018-07-01 (×7): qty 0.4

## 2018-07-01 MED ORDER — PROPOFOL 10 MG/ML IV BOLUS
INTRAVENOUS | Status: AC
  Filled 2018-07-01: qty 20

## 2018-07-01 SURGICAL SUPPLY — 41 items
BLADE HEX COATED 2.75 (ELECTRODE) ×2 IMPLANT
BLADE SURG SZ10 CARB STEEL (BLADE) ×2 IMPLANT
BNDG GAUZE ELAST 4 BULKY (GAUZE/BANDAGES/DRESSINGS) ×6 IMPLANT
COVER SURGICAL LIGHT HANDLE (MISCELLANEOUS) ×2 IMPLANT
COVER WAND RF STERILE (DRAPES) IMPLANT
DECANTER SPIKE VIAL GLASS SM (MISCELLANEOUS) IMPLANT
DERMABOND ADVANCED (GAUZE/BANDAGES/DRESSINGS)
DERMABOND ADVANCED .7 DNX12 (GAUZE/BANDAGES/DRESSINGS) IMPLANT
DRAPE LAPAROSCOPIC ABDOMINAL (DRAPES) IMPLANT
DRAPE LAPAROTOMY T 102X78X121 (DRAPES) IMPLANT
DRAPE LAPAROTOMY TRNSV 102X78 (DRAPE) IMPLANT
DRAPE SHEET LG 3/4 BI-LAMINATE (DRAPES) IMPLANT
DRSG PAD ABDOMINAL 8X10 ST (GAUZE/BANDAGES/DRESSINGS) ×4 IMPLANT
ELECT PENCIL ROCKER SW 15FT (MISCELLANEOUS) ×2 IMPLANT
ELECT REM PT RETURN 15FT ADLT (MISCELLANEOUS) ×2 IMPLANT
GAUZE SPONGE 4X4 12PLY STRL (GAUZE/BANDAGES/DRESSINGS) ×2 IMPLANT
GLOVE BIO SURGEON STRL SZ7 (GLOVE) ×6 IMPLANT
GLOVE BIOGEL PI IND STRL 7.0 (GLOVE) ×1 IMPLANT
GLOVE BIOGEL PI IND STRL 7.5 (GLOVE) ×3 IMPLANT
GLOVE BIOGEL PI INDICATOR 7.0 (GLOVE) ×1
GLOVE BIOGEL PI INDICATOR 7.5 (GLOVE) ×3
GOWN STRL REUS W/ TWL XL LVL3 (GOWN DISPOSABLE) ×1 IMPLANT
GOWN STRL REUS W/TWL LRG LVL3 (GOWN DISPOSABLE) ×4 IMPLANT
GOWN STRL REUS W/TWL XL LVL3 (GOWN DISPOSABLE) ×3 IMPLANT
KIT BASIN OR (CUSTOM PROCEDURE TRAY) ×2 IMPLANT
MARKER SKIN DUAL TIP RULER LAB (MISCELLANEOUS) ×2 IMPLANT
NEEDLE HYPO 25X1 1.5 SAFETY (NEEDLE) ×2 IMPLANT
NS IRRIG 1000ML POUR BTL (IV SOLUTION) ×2 IMPLANT
PACK BASIC VI WITH GOWN DISP (CUSTOM PROCEDURE TRAY) ×2 IMPLANT
SPONGE LAP 18X18 RF (DISPOSABLE) ×4 IMPLANT
SPONGE LAP 4X18 RFD (DISPOSABLE) IMPLANT
STAPLER VISISTAT 35W (STAPLE) ×2 IMPLANT
SUT MNCRL AB 4-0 PS2 18 (SUTURE) IMPLANT
SUT VIC AB 3-0 SH 18 (SUTURE) IMPLANT
SUT VIC AB 3-0 SH 27 (SUTURE) ×1
SUT VIC AB 3-0 SH 27XBRD (SUTURE) ×1 IMPLANT
SYR BULB IRRIGATION 50ML (SYRINGE) ×2 IMPLANT
SYR CONTROL 10ML LL (SYRINGE) ×2 IMPLANT
TOWEL OR 17X26 10 PK STRL BLUE (TOWEL DISPOSABLE) ×2 IMPLANT
TOWEL OR NON WOVEN STRL DISP B (DISPOSABLE) ×2 IMPLANT
YANKAUER SUCT BULB TIP 10FT TU (MISCELLANEOUS) ×2 IMPLANT

## 2018-07-01 NOTE — Progress Notes (Signed)
INFECTIOUS DISEASE PROGRESS NOTE  ID: Courtney [name] is a 73 y.o. 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 Active Problems:   HTN (hypertension)   Normocytic anemia   HLD (hyperlipidemia)   Restless leg syndrome   COPD (chronic obstructive pulmonary disease) (HCC)   CAD (coronary artery disease)   OSA and COPD overlap syndrome (HCC)   Pressure injury of skin   DM (diabetes mellitus) (HCC)   Cigarette smoker  Subjective: Resting quietly.   Abtx:  Anti-infectives (From admission, onward)   Start     Dose/Rate Route Frequency Ordered Stop   06/28/18 1400  metroNIDAZOLE (FLAGYL) tablet 500 mg     500 mg Oral Every 8 hours 06/28/18 1225     06/26/18 2200  vancomycin (VANCOCIN) 1,500 mg in sodium chloride 0.9 % 500 mL IVPB  Status:  Discontinued     1,500 mg 250 mL/hr over 120 Minutes Intravenous Every 24 hours 06/26/18 0446 06/28/18 1225   06/26/18 0600  clindamycin (CLEOCIN) IVPB 600 mg  Status:  Discontinued     600 mg 100 mL/hr over 30 Minutes Intravenous Every 8 hours 06/26/18 0508 06/28/18 1225   06/26/18 0415  clindamycin (CLEOCIN) IVPB 300 mg  Status:  Discontinued     300 mg 100 mL/hr over 30 Minutes Intravenous Every 8 hours 06/26/18 0356 06/26/18 0508   06/26/18 0400  cefTRIAXone (ROCEPHIN) 2 g in sodium chloride 0.9 % 100 mL IVPB  Status:  Discontinued     2 g 200 mL/hr over 30 Minutes Intravenous Every 24 hours 06/26/18 0356 06/26/18 0409   06/25/18 2330  vancomycin (VANCOCIN) IVPB 1000 mg/200 mL premix     1,000 mg 200 mL/hr over 60 Minutes Intravenous  Once 06/25/18 1958 06/26/18 0015   06/25/18 1500  clindamycin (CLEOCIN) IVPB 600 mg     600 mg 100 mL/hr over 30 Minutes Intravenous  Once 06/25/18 1453 06/25/18 1657   06/25/18 1330  vancomycin (VANCOCIN) IVPB 1000 mg/200 mL premix     1,000 mg 200 mL/hr over 60 Minutes Intravenous  Once 06/25/18 1318 06/25/18 1606   06/25/18 1330  cefTRIAXone (ROCEPHIN) 2 g in sodium chloride 0.9 %  100 mL IVPB     2 g 200 mL/hr over 30 Minutes Intravenous Every 24 hours 06/25/18 1318        Medications:  Scheduled: . chlorhexidine  15 mL Mouth Rinse BID  . Chlorhexidine Gluconate Cloth  6 each Topical Daily  . enoxaparin (LOVENOX) injection  40 mg Subcutaneous Q24H  . fentaNYL      . insulin aspart  0-5 Units Subcutaneous QHS  . insulin aspart  0-9 Units Subcutaneous TID WC  . insulin glargine  25 Units Subcutaneous Daily  . labetalol      . lisinopril  10 mg Oral Daily  . mouth rinse  15 mL Mouth Rinse q12n4p  . metoprolol succinate  50 mg Oral Daily  . metroNIDAZOLE  500 mg Oral Q8H  . multivitamin with minerals  1 tablet Oral Daily  . nutrition supplement (JUVEN)  1 packet Oral BID BM  . ENSURE MAX PROTEIN  11 oz Oral BID  . rosuvastatin  20 mg Oral Daily  . sodium chloride flush  10-40 mL Intracatheter Q12H    Objective: Vital signs in last 24 hours: Temp:  [96.8 F (36 C)-99.9 F (37.7 C)] 97.5 F (36.4 C) (03/02 1227) Pulse Rate:  [87-114] 87 (03/02 1315) Resp:  [16-34] 18 (03/02  1315) BP: (96-172)/(34-158) 152/70 (03/02 1315) SpO2:  [96 %-100 %] 96 % (03/02 1315)   General appearance: no distress Neck: R neck IJ Resp: rhonchi anterior - bilateral Cardio: regular rate and rhythm GI: normal findings: soft, non-tender and abnormal findings:  distended and hyperactive bowel sounds  Lab Results Recent Labs    06/29/18 0453 06/30/18 0610 07/01/18 0458  WBC 24.6* 24.4* 27.5*  HGB 9.0* 9.2* 9.3*  HCT 31.1* 31.7* 30.9*  NA 138  --  135  K 4.1  --  3.8  CL 110  --  103  CO2 23  --  25  BUN 15  --  12  CREATININE 0.50  --  0.51   Liver Panel No results for input(s): PROT, ALBUMIN, AST, ALT, ALKPHOS, BILITOT, BILIDIR, IBILI in the last 72 hours. Sedimentation Rate No results for input(s): ESRSEDRATE in the last 72 hours. C-Reactive Protein No results for input(s): CRP in the last 72 hours.  Microbiology: Recent Results (from the past 240 hour(s))    Culture, blood (routine x 2)     Status: None   Collection Time: 06/25/18 12:10 PM  Result Value Ref Range Status   Specimen Description   Final    BLOOD LEFT ANTECUBITAL Performed at [facility], [address], [place], [place] [address]    Special Requests   Final    BOTTLES DRAWN AEROBIC AND ANAEROBIC Blood Culture adequate volume Performed at [facility], [address], [place], [place] [address]    Culture   Final    NO GROWTH 5 DAYS Performed at [facility], [address], [place], [place] [address]    Report Status 06/30/2018 FINAL  Final  Culture, blood (routine x 2)     Status: None   Collection Time: 06/25/18  2:06 PM  Result Value Ref Range Status   Specimen Description   Final    BLOOD LEFT ANTECUBITAL Performed at [facility], [address] [place], [place] [address]    Special Requests   Final    BOTTLES DRAWN AEROBIC AND ANAEROBIC Blood Culture adequate volume Performed at [facility], [address], [place], [place] [address]    Culture   Final    NO GROWTH 5 DAYS Performed at [facility], [address] [place], [place] [address]    Report Status 06/30/2018 FINAL  Final  Aerobic/Anaerobic Culture (surgical/deep wound)     Status: None   Collection Time: 06/25/18  5:18 PM  Result Value Ref Range Status   Specimen Description   Final    ABSCESS Performed at [facility], [address] [place], [place] [address]    Special Requests RECTAL  Final   Gram Stain   Final    FEW WBC PRESENT, PREDOMINANTLY PMN ABUNDANT GRAM NEGATIVE RODS ABUNDANT GRAM POSITIVE COCCI Performed at [facility], [address] [place], [place] [address]    Culture   Final    RARE ESCHERICHIA COLI MIXED ANAEROBIC FLORA PRESENT.  CALL LAB IF FURTHER IID REQUIRED.    Report Status 06/29/2018 FINAL  Final   Organism ID, Bacteria ESCHERICHIA COLI  Final       Susceptibility   Escherichia coli - MIC*    AMPICILLIN 16 INTERMEDIATE Intermediate     CEFAZOLIN <=4 SENSITIVE Sensitive     CEFEPIME <=1 SENSITIVE Sensitive     CEFTAZIDIME <=1 SENSITIVE Sensitive  CEFTRIAXONE <=1 SENSITIVE Sensitive     CIPROFLOXACIN >=4 RESISTANT Resistant     GENTAMICIN <=1 SENSITIVE Sensitive     IMIPENEM <=0.25 SENSITIVE Sensitive     TRIMETH/SULFA <=20 SENSITIVE Sensitive     AMPICILLIN/SULBACTAM 4 SENSITIVE Sensitive     PIP/TAZO <=4 SENSITIVE Sensitive     Extended ESBL NEGATIVE Sensitive     * RARE ESCHERICHIA COLI  Culture, blood (x 2)     Status: None   Collection Time: 06/25/18  7:52 PM  Result Value Ref Range Status   Specimen Description   Final    BLOOD ARTERIAL LINE Performed at [facility], [address] [place], [place] [address]    Special Requests   Final    BOTTLES DRAWN AEROBIC AND ANAEROBIC Blood Culture adequate volume Performed at [facility], [address], [place], [place] [address]    Culture   Final    NO GROWTH 5 DAYS Performed at [facility], [address] [place], [place] [address]    Report Status 06/30/2018 FINAL  Final  Culture, blood (x 2)     Status: None   Collection Time: 06/25/18  9:34 PM  Result Value Ref Range Status   Specimen Description BLOOD LEFT ARM  Final   Special Requests   Final    BOTTLES DRAWN AEROBIC ONLY Blood Culture results may not be optimal due to an inadequate volume of blood received in culture bottles   Culture   Final    NO GROWTH 5 DAYS Performed at [facility], [address], [place], [place] [address]    Report Status 06/30/2018 FINAL  Final  MRSA PCR Screening     Status: None   Collection Time: 06/25/18 10:19 PM  Result Value Ref Range Status   MRSA by PCR NEGATIVE NEGATIVE Final    Comment:        The GeneXpert MRSA Assay (FDA approved for NASAL specimens only), is one component of a comprehensive MRSA  colonization surveillance program. It is not intended to diagnose MRSA infection nor to guide or monitor treatment for MRSA infections. Performed at [facility], [address], [place], [place] [address]   Urine culture     Status: None   Collection Time: 06/26/18  1:35 PM  Result Value Ref Range Status   Specimen Description   Final    URINE, RANDOM Performed at [facility], [address], [place], [place] [address]    Special Requests   Final    Normal Performed at [facility], [address], [place], [place] [address]    Culture   Final    NO GROWTH Performed at [facility], [address] [place], [place] [address]    Report Status 06/27/2018 FINAL  Final    Studies/Results: No results found.   Assessment/Plan: Fournier's Gangrene  Debrided 3-3, 2-27  Cx E coli, mixed anaerobes DM2, poorly controlled  A1C 12.1% HTN  Total days of antibiotics: 7 (ceftriaxone/flagyl)  Noted purulence in OR.  Will continue her on her current anbx Watch her WBC and temp Glc control better         Johny Sax MD, FACP Infectious Diseases (pager) [phone] [email or web address] 07/01/2018, 1:50 PM  LOS: 6 days

## 2018-07-01 NOTE — Plan of Care (Signed)
  Problem: Pain Managment: Goal: General experience of comfort will improve Outcome: Progressing   Problem: Safety: Goal: Ability to remain free from injury will improve Outcome: Progressing   

## 2018-07-01 NOTE — Anesthesia Preprocedure Evaluation (Signed)
Anesthesia Evaluation  Patient identified by MRN, date of birth, ID band Patient awake    Reviewed: Allergy & Precautions, NPO status , Patient's Chart, lab work & pertinent test results  History of Anesthesia Complications Negative for: history of anesthetic complications  Airway Mallampati: II  TM Distance: >3 FB Neck ROM: Full    Dental no notable dental hx.    Pulmonary asthma , sleep apnea , COPD, Current Smoker,    Pulmonary exam normal        Cardiovascular hypertension, + CAD and + Past MI  Normal cardiovascular exam     Neuro/Psych negative neurological ROS  negative psych ROS   GI/Hepatic negative GI ROS, Neg liver ROS,   Endo/Other  diabetes, Poorly Controlled  Renal/GU negative Renal ROS  negative genitourinary   Musculoskeletal negative musculoskeletal ROS (+)   Abdominal   Peds  Hematology negative hematology ROS (+)   Anesthesia Other Findings   Reproductive/Obstetrics                             Anesthesia Physical Anesthesia Plan  ASA: III  Anesthesia Plan: General   Post-op Pain Management:    Induction: Intravenous  PONV Risk Score and Plan: 2 and Ondansetron, Dexamethasone and Treatment may vary due to age or medical condition  Airway Management Planned: Oral ETT  Additional Equipment: None  Intra-op Plan:   Post-operative Plan: Extubation in OR  Informed Consent: I have reviewed the patients History and Physical, chart, labs and discussed the procedure including the risks, benefits and alternatives for the proposed anesthesia with the patient or authorized representative who has indicated his/her understanding and acceptance.     Dental advisory given  Plan Discussed with:   Anesthesia Plan Comments:         Anesthesia Quick Evaluation  

## 2018-07-01 NOTE — Progress Notes (Signed)
PT Cancellation Note  Patient Details Name: Courtney [name] MRN: [id number] DOB: 1945-09-25   Cancelled Treatment:    Reason Eval/Treat Not Completed: Patient at procedure or test/unavailable; down for OR for further debridement.  Will attempt again another day.   [name] 07/01/2018, 12:24 PM  Sheran Lawless, PT Acute Rehabilitation Services [phone] 07/01/2018

## 2018-07-01 NOTE — Anesthesia Procedure Notes (Signed)
Procedure Name: Intubation Date/Time: 07/01/2018 11:30 AM Performed by: Eben Burow, CRNA Pre-anesthesia Checklist: Patient identified, Emergency Drugs available, Suction available, Patient being monitored and Timeout performed Patient Re-evaluated:Patient Re-evaluated prior to induction Oxygen Delivery Method: Circle system utilized Preoxygenation: Pre-oxygenation with 100% oxygen Induction Type: IV induction Ventilation: Mask ventilation without difficulty Laryngoscope Size: Mac and 4 Grade View: Grade II Tube type: Oral Tube size: 7.0 mm Number of attempts: 1 Airway Equipment and Method: Stylet Placement Confirmation: ETT inserted through vocal cords under direct vision,  positive ETCO2 and breath sounds checked- equal and bilateral Secured at: 22 cm Tube secured with: Tape Dental Injury: Teeth and Oropharynx as per pre-operative assessment

## 2018-07-01 NOTE — Op Note (Signed)
PRE-OPERATIVE DIAGNOSIS:  Fourniers gangrene  POST-OPERATIVE DIAGNOSIS:  Fourniers gangrene  PROCEDURE:   EXCISIONAL DEBRIDEMENT, dressing change   Surgeon Dr Harden Mo  ASSISTANT: none   ANESTHESIA:   general  EBL: 50 cc  SPECIMEN:  No Specimen  COUNTS:  correct at end of completion  PLAN OF CARE: Pt admitted  PATIENT DISPOSITION:  PACU - hemodynamically stable.  INDICATION: 73 y.o. F with uncontrolled diabetes and severe perineal infection. She has undergone debridement twice now and has elevated wbc and continued purulence from the upper portion of the wound.   Frequency of debridement: twice Area of body debrided: perineum, right side, mons pubis Presence and extent of infected tissue: significant Presence and extent of non viable tissue: significant  OR FINDINGS: significant necrotic tissue tracking around labia, down into gluteal region and up into inguinal canal, I opened up the inguinal canal and mons pubis. The entire wound is now 20x16x5 cm in size.    DESCRIPTION: The patient was identified in the pre op holding area and taken to the OR, where they were laid supine on the OR table.  General anesthesia was induced.  The operative area was prepped and draped in the usual sterile fashion.  A surgical time out was performed, indicating the correct patient, procedure, positioning and pre-operative antibiotics.    After this was completed, the wound was measured to be 20x16x5 cm in size.  The total area of devitalized tissue was 25x15 cm.  Scissors and bovie were used to debride the wound to vital tissues.  Debridement was carried down to the level of muscle and bone.  Skin and fat were removed.  There was necrotic material in the wound that would inhibit healing and or promote adjacent tissue breakdown.  At the end of the procedure the debrided area measured 25x15 cm.   The wound was then packed with Kerlex roll gauze and covered with a sterile dressing.  The  patient was awakened from anesthesia and sent to the PACU in stable condition.  All counts were correct per OR staff.

## 2018-07-01 NOTE — Progress Notes (Signed)
4 Days Post-Op   Subjective/Chief Complaint: No complaints, wbc up   Objective: Vital signs in last 24 hours: Temp:  [92.1 F (33.4 C)-99.9 F (37.7 C)] 98.5 F (36.9 C) (03/02 0413) Pulse Rate:  [98-114] 98 (03/02 0600) Resp:  [16-34] 19 (03/02 0600) BP: (96-172)/(34-138) 142/91 (03/02 0600) SpO2:  [98 %-100 %] 100 % (03/02 0600) Last BM Date: 06/30/18  Intake/Output from previous day: 03/01 0701 - 03/02 0700 In: 2261.8 [P.O.:120; I.V.:2141.8] Out: 1800 [Urine:1800] Intake/Output this shift: No intake/output data recorded.  Gen: Alert, NAD, pleasant Card: RRR Pulm:effort normal Abd: Soft, NT/ND GU: wound tunnels very deep towards right inguinal canal    Lab Results:  Recent Labs    06/30/18 0610 07/01/18 0458  WBC 24.4* 27.5*  HGB 9.2* 9.3*  HCT 31.7* 30.9*  PLT 269 266   BMET Recent Labs    06/29/18 0453 07/01/18 0458  NA 138 135  K 4.1 3.8  CL 110 103  CO2 23 25  GLUCOSE 152* 166*  BUN 15 12  CREATININE 0.50 0.51  CALCIUM 8.1* 8.5*   PT/INR No results for input(s): LABPROT, INR in the last 72 hours. ABG No results for input(s): PHART, HCO3 in the last 72 hours.  Invalid input(s): PCO2, PO2  Studies/Results: No results found.  Anti-infectives: Anti-infectives (From admission, onward)   Start     Dose/Rate Route Frequency Ordered Stop   06/28/18 1400  metroNIDAZOLE (FLAGYL) tablet 500 mg     500 mg Oral Every 8 hours 06/28/18 1225     06/26/18 2200  vancomycin (VANCOCIN) 1,500 mg in sodium chloride 0.9 % 500 mL IVPB  Status:  Discontinued     1,500 mg 250 mL/hr over 120 Minutes Intravenous Every 24 hours 06/26/18 0446 06/28/18 1225   06/26/18 0600  clindamycin (CLEOCIN) IVPB 600 mg  Status:  Discontinued     600 mg 100 mL/hr over 30 Minutes Intravenous Every 8 hours 06/26/18 0508 06/28/18 1225   06/26/18 0415  clindamycin (CLEOCIN) IVPB 300 mg  Status:  Discontinued     300 mg 100 mL/hr over 30 Minutes Intravenous Every 8 hours  06/26/18 0356 06/26/18 0508   06/26/18 0400  cefTRIAXone (ROCEPHIN) 2 g in sodium chloride 0.9 % 100 mL IVPB  Status:  Discontinued     2 g 200 mL/hr over 30 Minutes Intravenous Every 24 hours 06/26/18 0356 06/26/18 0409   06/25/18 2330  vancomycin (VANCOCIN) IVPB 1000 mg/200 mL premix     1,000 mg 200 mL/hr over 60 Minutes Intravenous  Once 06/25/18 1958 06/26/18 0015   06/25/18 1500  clindamycin (CLEOCIN) IVPB 600 mg     600 mg 100 mL/hr over 30 Minutes Intravenous  Once 06/25/18 1453 06/25/18 1657   06/25/18 1330  vancomycin (VANCOCIN) IVPB 1000 mg/200 mL premix     1,000 mg 200 mL/hr over 60 Minutes Intravenous  Once 06/25/18 1318 06/25/18 1606   06/25/18 1330  cefTRIAXone (ROCEPHIN) 2 g in sodium chloride 0.9 % 100 mL IVPB     2 g 200 mL/hr over 30 Minutes Intravenous Every 24 hours 06/25/18 1318        Assessment/Plan: Uncontrolled DM- A1c 12.1 HTN HLD CAD H/o STEMI 2011 s/p RCA stent COPD Acute on chronic anemia  Urinary retention   Sepsis Necrotizing soft tissue infection POD 6/4 S/p incision and drainage perirectal abscess 2/25 Dr. Maisie Fus S/p EXCISIONAL DEBRIDEMENT 2/27 Dr. Maisie Fus - intraop cultures:RARE ESCHERICHIA COLI, resistant to cipro only - with drainage and wbc  up needs to return to OR today for dressing change likely further debridement ID -rocephin/clindamycin/vancomycin 2/25>>day#6, still not clear of infection surgically VTE -SCDs, sq heparin--this can be lovenox if ok with medicine FEN -IVF, CM diet Follow up -TBD Courtney [name] 07/01/2018

## 2018-07-01 NOTE — Progress Notes (Addendum)
PROGRESS NOTE    Courtney [name]  MLY:[id number] DOB: 1945/07/27 DOA: 06/25/2018 PCP: Jordan Hawks, PA-C   Brief Narrative: Patient is a 73 year old female with history of uncontrolled type diabetes mellitus, coronary artery disease, OSA on CPAP, asthma, COPD who presents to the emergency department with complaints of generalized weakness, gluteal abscess for several days.  She was found to be septic with Forn[place] gangrene/NF and was taken emergently to the OR.  She underwent  repeat debridement  by general surgery on 06/27/18.ID also consulted for the guidance for antibiotics. She has underwent excisional debridement today by surgery.  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  Repeat I&D on 2/27.  Currently on ceftriaxone and flagyl.  Negative blood cultures.  So far showing Ecoli and anareobes on wound cultures.  Wound care as per general surgery. ID consultation done  for recommendation on antibiotics.  Still has significant leukocytosis.  Afebrile. Underwent excisional  debridement today.  Stage II pressure ulcers of the right and left medial sacrum/right groin: Present on admission.  Wound care as per general surgery.  Optimize nutritional status.  Uncontrolled type 2 diabetes mellitus: Hemoglobin A1c of 12.1.  Follows with endocrinology.  Continue Lantus and sliding scale  insulin for now.  Diabetic coordinator  following.  COPD/asthma: Not on any home medications.  Currently COPD is stable.  We will put her on albuterol inhaler on discharge.  Follow-up with pulmonology as an  outpatient.  OSA: Continue CPAP at night.  Coronary artery disease: Continue metoprolol, statin.  Not on aspirin at home.  Hypertension: Continue metoprolol at increased dose .  Added lisinopril.PRN meds.  BP fluctuating.  Hyperlipidemia: Continue statin  Obesity: BMI of 36.  Dietitian following.  Nutrition Problem: Increased nutrient needs Etiology: wound healing, acute illness, post-op healing      DVT prophylaxis:Lovenox Code Status: Full Family Communication: None present at the bedside Disposition Plan: Home after full  Management,surgical clearance   Consultants: General surgery,ID  Procedures: I&D  Antimicrobials:  Anti-infectives (From admission, onward)   Start     Dose/Rate Route Frequency Ordered Stop   06/28/18 1400  [MAR Hold]  metroNIDAZOLE (FLAGYL) tablet 500 mg     (MAR Hold since Mon 07/01/2018 at 1003. Reason: Transfer to a Procedural area.)   500 mg Oral Every 8 hours 06/28/18 1225     06/26/18 2200  vancomycin (VANCOCIN) 1,500 mg in sodium chloride 0.9 % 500 mL IVPB  Status:  Discontinued     1,500 mg 250 mL/hr over 120 Minutes Intravenous Every 24 hours 06/26/18 0446 06/28/18 1225   06/26/18 0600  clindamycin (CLEOCIN) IVPB 600 mg  Status:  Discontinued     600 mg 100 mL/hr over 30 Minutes Intravenous Every 8 hours 06/26/18 0508 06/28/18 1225   06/26/18 0415  clindamycin (CLEOCIN) IVPB 300 mg  Status:  Discontinued     300 mg 100 mL/hr over 30 Minutes Intravenous Every 8 hours 06/26/18 0356 06/26/18 0508   06/26/18 0400  cefTRIAXone (ROCEPHIN) 2 g in sodium chloride 0.9 % 100 mL  IVPB  Status:  Discontinued     2 g 200 mL/hr over 30 Minutes Intravenous Every 24 hours 06/26/18 0356 06/26/18 0409   06/25/18 2330  vancomycin (VANCOCIN) IVPB 1000 mg/200 mL premix     1,000 mg 200 mL/hr over 60 Minutes Intravenous  Once 06/25/18 1958 06/26/18 0015   06/25/18 1500  clindamycin (CLEOCIN) IVPB 600 mg     600 mg 100 mL/hr over 30 Minutes Intravenous  Once  06/25/18 1453 06/25/18 1657   06/25/18 1330  vancomycin (VANCOCIN) IVPB 1000 mg/200 mL premix     1,000 mg 200 mL/hr over 60 Minutes Intravenous  Once 06/25/18 1318 06/25/18 1606   06/25/18 1330  [MAR Hold]  cefTRIAXone (ROCEPHIN) 2 g in sodium chloride 0.9 % 100 mL IVPB     (MAR Hold since Mon 07/01/2018 at 1003. Reason: Transfer to a Procedural area.)   2 g 200 mL/hr over 30 Minutes Intravenous Every 24 hours 06/25/18 1318        Subjective: Patient seen and examined the bedside this morning.  Looks comfortable.  Pain well controlled.  Undergoing excisional debridement again today. Objective: Vitals:   07/01/18 0200 07/01/18 0413 07/01/18 0600 07/01/18 0900  BP: (!) 150/65 (!) 172/66 (!) 142/91 (!) 170/134  Pulse: (!) 104 100 98 (!) 104  Resp: (!) 21 (!) 25 19 (!) 21  Temp:  98.5 F (36.9 C)  98.1 F (36.7 C)  TempSrc:  Axillary    SpO2: 100% 100% 100% 100%  Weight:      Height:        Intake/Output Summary (Last 24 hours) at 07/01/2018 1218 Last data filed at 07/01/2018 1610 Gross per 24 hour  Intake 1951.1 ml  Output 1800 ml  Net 151.1 ml     Examination:   General exam: Appears calm and comfortable ,Not in distress,obese HEENT:PERRL,Oral mucosa moist, Ear/Nose normal on gross exam Respiratory system: Bilateral equal air entry, normal vesicular breath sounds, no wheezes or crackles  Cardiovascular system: S1 & S2 heard, RRR. No JVD, murmurs, rubs, gallops or clicks. Gastrointestinal system: Abdomen is nondistended, soft and nontender. No organomegaly or masses felt. Normal bowel sounds heard. Central nervous system: Alert and oriented. No focal neurological deficits. Extremities: No edema, no clubbing ,no cyanosis, distal peripheral pulses palpable. Skin/GU: Perineal wound wrapped with dressings           Data Reviewed: I have personally reviewed following labs and imaging studies  CBC: Recent Labs  Lab 06/25/18 1209 06/25/18 2230  06/27/18 0559  06/28/18 0345 06/29/18 0453 06/30/18 0610 07/01/18 0458  WBC 39.2* 42.3*   < > 25.7* 23.8* 24.6* 24.4* 27.5*  NEUTROABS 34.2* 40.2*  --   --  22.6*  --  19.8* 22.0*  HGB 13.5 11.7*   < > 10.3* 9.4* 9.0* 9.2* 9.3*  HCT 46.0 39.0   < > 34.0* 32.1* 31.1* 31.7* 30.9*  MCV 83.9 82.8   < > 81.3 83.2 84.7 84.1 82.6  PLT 365 217   < > 225 236 248 269 266   < > = values in this interval not displayed.   Basic Metabolic Panel: Recent Labs  Lab 06/26/18 0745 06/27/18 0559 06/28/18 0345 06/29/18 0453 07/01/18 0458  NA 135 136 137 138 135  K 3.4* 3.6 3.8 4.1 3.8  CL 102 105 108 110 103  CO2 GLUCOSE 100* 225* 121* 152* 166*  BUN CREATININE 0.68 0.63 0.58  0.50 0.51  CALCIUM 8.3* 8.4* 8.1* 8.1* 8.5*   GFR: Estimated Creatinine Clearance: 70.6 mL/min (by C-G formula based on SCr of 0.51 mg/dL). Liver Function Tests: Recent Labs  Lab 06/25/18 1947  AST 44*  ALT 50*  ALKPHOS 147*  BILITOT 0.4  PROT 5.3*  ALBUMIN 1.9*   No results for input(s): LIPASE, AMYLASE in the last 168 hours. No results for input(s): AMMONIA in the last 168 hours. Coagulation Profile: Recent Labs  Lab 06/25/18 1859 06/25/18 1947  INR 1.2 1.2   Cardiac Enzymes: Recent Labs  Lab 06/25/18 1209  TROPONINI 0.03*   BNP (last 3 results) No results for input(s): PROBNP in the last 8760 hours. HbA1C: No results for input(s): HGBA1C in the last 72 hours. CBG: Recent Labs  Lab 06/30/18 0801 06/30/18 1153 06/30/18 1702 06/30/18 2105 07/01/18 0753  GLUCAP 251* 221* 261* 213* 165*   Lipid Profile: No results for input(s): CHOL, HDL, LDLCALC, TRIG, CHOLHDL, LDLDIRECT in the last 72 hours. Thyroid Function Tests: No results for input(s): TSH, T4TOTAL, FREET4, T3FREE, THYROIDAB in the last 72 hours. Anemia Panel: No results for input(s): VITAMINB12, FOLATE, FERRITIN, TIBC, IRON, RETICCTPCT in the last 72 hours. Sepsis Labs: Recent Labs  Lab 06/25/18 1211  06/25/18 1554 06/25/18 1947 06/26/18 0100  PROCALCITON  --   --  8.03  --   LATICACIDVEN 5.0* 3.2* 2.7* 1.5    Recent Results (from the past 240 hour(s))  Culture, blood (routine x 2)     Status: None   Collection Time: 06/25/18 12:10 PM  Result Value Ref Range Status   Specimen Description   Final    BLOOD LEFT ANTECUBITAL Performed at [facility], [address], [place], [place] [address]    Special Requests   Final    BOTTLES DRAWN AEROBIC AND ANAEROBIC Blood Culture adequate volume Performed at [facility], [address], [place], [place] [address]    Culture   Final    NO GROWTH 5 DAYS Performed at [facility], [address] [place], [place] [address]    Report Status 06/30/2018 FINAL  Final  Culture, blood (routine x 2)     Status: None   Collection Time: 06/25/18  2:06 PM  Result Value Ref Range Status   Specimen Description   Final    BLOOD LEFT ANTECUBITAL Performed at [facility], [address], [place], [place] [address]    Special Requests   Final    BOTTLES DRAWN AEROBIC AND ANAEROBIC Blood Culture adequate volume Performed at [facility], [address] [place], [place] [address]    Culture   Final    NO GROWTH 5 DAYS Performed at [facility], [address] [place], [place] [address]    Report Status 06/30/2018 FINAL  Final  Aerobic/Anaerobic Culture (surgical/deep wound)     Status: None   Collection Time: 06/25/18  5:18 PM  Result Value Ref Range Status   Specimen Description   Final    ABSCESS Performed at [facility], [address], [place], [place] [address]    Special Requests RECTAL  Final   Gram Stain   Final    FEW WBC PRESENT, PREDOMINANTLY PMN ABUNDANT GRAM NEGATIVE RODS ABUNDANT GRAM POSITIVE COCCI Performed at [facility], [address] [place], [place] [address]    Culture   Final    RARE ESCHERICHIA  COLI MIXED ANAEROBIC FLORA PRESENT.  CALL LAB IF FURTHER IID REQUIRED.    Report Status 06/29/2018 FINAL  Final   Organism ID, Bacteria ESCHERICHIA COLI  Final      Susceptibility   Escherichia coli - MIC*    AMPICILLIN 16 INTERMEDIATE Intermediate     CEFAZOLIN <=4 SENSITIVE Sensitive     CEFEPIME <=1 SENSITIVE Sensitive     CEFTAZIDIME <=1 SENSITIVE Sensitive     CEFTRIAXONE <=1 SENSITIVE Sensitive     CIPROFLOXACIN >=4 RESISTANT Resistant     GENTAMICIN <=1 SENSITIVE Sensitive     IMIPENEM <=0.25 SENSITIVE Sensitive     TRIMETH/SULFA <=20 SENSITIVE Sensitive     AMPICILLIN/SULBACTAM 4 SENSITIVE Sensitive     PIP/TAZO <=4 SENSITIVE Sensitive     Extended ESBL NEGATIVE Sensitive     * RARE ESCHERICHIA COLI  Culture, blood (x 2)     Status: None   Collection Time: 06/25/18  7:52 PM  Result Value Ref Range Status   Specimen Description   Final    BLOOD ARTERIAL LINE Performed at [facility], [address], [place], [place] [address]    Special Requests   Final    BOTTLES DRAWN AEROBIC AND ANAEROBIC Blood Culture adequate volume Performed at [facility], [address] [place], [place] [address]    Culture   Final    NO GROWTH 5 DAYS Performed at [facility], [address], [place], [place] [address]    Report Status 06/30/2018 FINAL  Final  Culture, blood (x 2)     Status: None   Collection Time: 06/25/18  9:34 PM  Result Value Ref Range Status   Specimen Description BLOOD LEFT ARM  Final   Special Requests   Final    BOTTLES DRAWN AEROBIC ONLY Blood Culture results may not be optimal due to an inadequate volume of blood received in culture bottles   Culture   Final    NO GROWTH 5 DAYS Performed at [facility], [address], [place], [place] [address]    Report Status 06/30/2018 FINAL  Final  MRSA PCR Screening     Status: None   Collection Time: 06/25/18 10:19 PM  Result Value Ref Range Status   MRSA by PCR  NEGATIVE NEGATIVE Final    Comment:        The GeneXpert MRSA Assay (FDA approved for NASAL specimens only), is one component of a comprehensive MRSA colonization surveillance program. It is not intended to diagnose MRSA infection nor to guide or monitor treatment for MRSA infections. Performed at [facility], [address] [place], [place] [address]   Urine culture     Status: None   Collection Time: 06/26/18  1:35 PM  Result Value Ref Range Status   Specimen Description   Final    URINE, RANDOM Performed at [facility], [address] [place], [place] [address]    Special Requests   Final    Normal Performed at [facility], [address] [place], [place] [address]    Culture   Final    NO GROWTH Performed at [facility], [address] [place], [place] [address]    Report Status 06/27/2018 FINAL  Final         Radiology Studies: No results found.      Scheduled Meds: . [MAR Hold] chlorhexidine  15 mL Mouth Rinse BID  . [MAR Hold] Chlorhexidine Gluconate Cloth  6  each Topical Daily  . enoxaparin (LOVENOX) injection  40 mg Subcutaneous Q24H  . [MAR Hold] insulin aspart  0-5 Units Subcutaneous QHS  . [MAR Hold] insulin aspart  0-9 Units Subcutaneous TID WC  . [MAR Hold] insulin glargine  25 Units Subcutaneous Daily  . [MAR Hold] lisinopril  10 mg Oral Daily  . [MAR Hold] mouth rinse  15 mL Mouth Rinse q12n4p  . [MAR Hold] metoprolol succinate  50 mg Oral Daily  . [MAR Hold] metroNIDAZOLE  500 mg Oral Q8H  . [MAR Hold] multivitamin with minerals  1 tablet Oral Daily  . [MAR Hold] nutrition supplement (JUVEN)  1 packet Oral BID BM  . [MAR Hold] ENSURE MAX PROTEIN  11 oz Oral BID  . [MAR Hold] rosuvastatin  20 mg Oral Daily  . [MAR Hold] sodium chloride flush  10-40 mL Intracatheter Q12H   Continuous Infusions: . [MAR Hold] cefTRIAXone (ROCEPHIN)  IV Stopped (06/30/18 1442)  . lactated  ringers 1,000 mL with potassium chloride 20 mEq infusion 100 mL/hr at 07/01/18 1020     LOS: 6 days    Time spent:25 mins. More than 50% of that time was spent in counseling and/or coordination of care.      Burnadette Pop, MD [facility] Pager [phone]  If 7PM-7AM, please contact night-coverage [email or web address] Password [facility] 07/01/2018, 12:18 PM

## 2018-07-01 NOTE — Anesthesia Postprocedure Evaluation (Signed)
Anesthesia Post Note  Patient: Courtney [name]  Procedure(s) Performed: IRRIGATION AND DEBRIDEMENT ABSCESS (N/A )     Patient location during evaluation: PACU Anesthesia Type: General Level of consciousness: awake and alert Pain management: pain level controlled Vital Signs Assessment: post-procedure vital signs reviewed and stable Respiratory status: spontaneous breathing, nonlabored ventilation, respiratory function stable and patient connected to nasal cannula oxygen Cardiovascular status: blood pressure returned to baseline and stable Postop Assessment: no apparent nausea or vomiting Anesthetic complications: no    Last Vitals:  Vitals:   07/01/18 1315 07/01/18 1330  BP: (!) 152/70 (!) 157/91  Pulse: 87   Resp: 18 15  Temp:  36.4 C  SpO2: 96%     Last Pain:  Vitals:   07/01/18 1346  TempSrc:   PainSc: Asleep                 Phillips Grout

## 2018-07-01 NOTE — Transfer of Care (Signed)
Immediate Anesthesia Transfer of Care Note  Patient: Courtney [name]  Procedure(s) Performed: IRRIGATION AND DEBRIDEMENT ABSCESS (N/A )  Patient Location: PACU  Anesthesia Type:General  Level of Consciousness: awake, alert  and oriented  Airway & Oxygen Therapy: Patient Spontanous Breathing and Patient connected to face mask oxygen  Post-op Assessment: Report given to RN and Post -op Vital signs reviewed and stable  Post vital signs: Reviewed and stable  Last Vitals:  Vitals Value Taken Time  BP 143/117 07/01/2018 12:27 PM  Temp    Pulse 92 07/01/2018 12:29 PM  Resp 19 07/01/2018 12:29 PM  SpO2 90 % 07/01/2018 12:29 PM  Vitals shown include unvalidated device data.  Last Pain:  Vitals:   07/01/18 1017  TempSrc:   PainSc: 2       Patients Stated Pain Goal: 0 (06/28/18 1250)  Complications: No apparent anesthesia complications

## 2018-07-02 ENCOUNTER — Encounter (HOSPITAL_COMMUNITY): Payer: Self-pay | Admitting: General Surgery

## 2018-07-02 ENCOUNTER — Inpatient Hospital Stay (HOSPITAL_COMMUNITY): Payer: Medicare Other

## 2018-07-02 DIAGNOSIS — I82409 Acute embolism and thrombosis of unspecified deep veins of unspecified lower extremity: Secondary | ICD-10-CM

## 2018-07-02 DIAGNOSIS — I82401 Acute embolism and thrombosis of unspecified deep veins of right lower extremity: Secondary | ICD-10-CM

## 2018-07-02 LAB — CBC WITH DIFFERENTIAL/PLATELET
ABS IMMATURE GRANULOCYTES: 0.92 10*3/uL — AB (ref 0.00–0.07)
Basophils Absolute: 0.1 10*3/uL (ref 0.0–0.1)
Basophils Relative: 0 %
Eosinophils Absolute: 0 10*3/uL (ref 0.0–0.5)
Eosinophils Relative: 0 %
HCT: 29.4 % — ABNORMAL LOW (ref 36.0–46.0)
Hemoglobin: 8.6 g/dL — ABNORMAL LOW (ref 12.0–15.0)
Immature Granulocytes: 3 %
Lymphocytes Relative: 8 %
Lymphs Abs: 2.5 10*3/uL (ref 0.7–4.0)
MCH: 24.3 pg — ABNORMAL LOW (ref 26.0–34.0)
MCHC: 29.3 g/dL — ABNORMAL LOW (ref 30.0–36.0)
MCV: 83.1 fL (ref 80.0–100.0)
Monocytes Absolute: 1.4 10*3/uL — ABNORMAL HIGH (ref 0.1–1.0)
Monocytes Relative: 5 %
NEUTROS PCT: 84 %
Neutro Abs: 25.1 10*3/uL — ABNORMAL HIGH (ref 1.7–7.7)
Platelets: 285 10*3/uL (ref 150–400)
RBC: 3.54 MIL/uL — ABNORMAL LOW (ref 3.87–5.11)
RDW: 14.9 % (ref 11.5–15.5)
WBC: 30 10*3/uL — ABNORMAL HIGH (ref 4.0–10.5)
nRBC: 0 % (ref 0.0–0.2)

## 2018-07-02 LAB — GLUCOSE, CAPILLARY
GLUCOSE-CAPILLARY: 111 mg/dL — AB (ref 70–99)
Glucose-Capillary: 165 mg/dL — ABNORMAL HIGH (ref 70–99)
Glucose-Capillary: 203 mg/dL — ABNORMAL HIGH (ref 70–99)
Glucose-Capillary: 271 mg/dL — ABNORMAL HIGH (ref 70–99)

## 2018-07-02 MED ORDER — INSULIN GLARGINE 100 UNIT/ML ~~LOC~~ SOLN
25.0000 [IU] | Freq: Every day | SUBCUTANEOUS | Status: DC
  Administered 2018-07-02 – 2018-07-08 (×7): 25 [IU] via SUBCUTANEOUS
  Filled 2018-07-02 (×7): qty 0.25

## 2018-07-02 NOTE — Progress Notes (Signed)
[facility] Progress Note  1 Day Post-Op  Subjective: CC-  Slept well. Ready for dressing change.  Objective: Vital signs in last 24 hours: Temp:  [97.5 F (36.4 C)-99.1 F (37.3 C)] 98.8 F (37.1 C) (03/03 0500) Pulse Rate:  [87-104] 91 (03/03 0500) Resp:  [15-28] 15 (03/03 0500) BP: (129-170)/(57-158) 129/57 (03/03 0400) SpO2:  [96 %-100 %] 98 % (03/03 0500) Last BM Date: 06/30/18  Intake/Output from previous day: 03/02 0701 - 03/03 0700 In: 2423 [P.O.:120; I.V.:2105.2; IV Piggyback:197.8] Out: 1175 [Urine:1175] Intake/Output this shift: No intake/output data recorded.  PE: Gen: Alert, NAD, pleasant HEENT: EOM's intact, pupils equal and round Pulm:effort normal Abd: Soft, NT GU: wound tunnels very deep towards right inguinal canal and anus/tracks up buttocks, no purulent drainage noted on kerlex that was removed, trace bloody drainage.       Lab Results:  Recent Labs    07/01/18 0458 07/02/18 0500  WBC 27.5* 30.0*  HGB 9.3* 8.6*  HCT 30.9* 29.4*  PLT 266 285   BMET Recent Labs    07/01/18 0458  NA 135  K 3.8  CL 103  CO2 25  GLUCOSE 166*  BUN 12  CREATININE 0.51  CALCIUM 8.5*   PT/INR No results for input(s): LABPROT, INR in the last 72 hours. CMP     Component Value Date/Time   NA 135 07/01/2018 0458   NA 142 10/02/2016 0856   NA 142 08/06/2014 0854   K 3.8 07/01/2018 0458   K 4.8 08/06/2014 0854   CL 103 07/01/2018 0458   CL 103 01/26/2012 1242   CO2 25 07/01/2018 0458   CO2 29 08/06/2014 0854   GLUCOSE 166 (H) 07/01/2018 0458   GLUCOSE 372 (H) 08/06/2014 0854   GLUCOSE 122 (H) 01/26/2012 1242   BUN 12 07/01/2018 0458   BUN 11 10/02/2016 0856   BUN 12.1 08/06/2014 0854   CREATININE 0.51 07/01/2018 0458   CREATININE 1.1 08/06/2014 0854   CALCIUM 8.5 (L) 07/01/2018 0458   CALCIUM 10.6 (H) 08/06/2014 0854   PROT 5.3 (L) 06/25/2018 1947   PROT 6.7 10/02/2016 0856   PROT 6.9 08/06/2014 0854   ALBUMIN 1.9 (L)  06/25/2018 1947   ALBUMIN 4.1 10/02/2016 0856   ALBUMIN 3.6 08/06/2014 0854   AST 44 (H) 06/25/2018 1947   AST 12 08/06/2014 0854   ALT 50 (H) 06/25/2018 1947   ALT 15 08/06/2014 0854   ALKPHOS 147 (H) 06/25/2018 1947   ALKPHOS 140 08/06/2014 0854   BILITOT 0.4 06/25/2018 1947   BILITOT 0.2 10/02/2016 0856   BILITOT 0.29 08/06/2014 0854   GFRNONAA >60 07/01/2018 0458   GFRAA >60 07/01/2018 0458   Lipase  No results found for: LIPASE     Studies/Results: No results found.  Anti-infectives: Anti-infectives (From admission, onward)   Start     Dose/Rate Route Frequency Ordered Stop   06/28/18 1400  metroNIDAZOLE (FLAGYL) tablet 500 mg     500 mg Oral Every 8 hours 06/28/18 1225     06/26/18 2200  vancomycin (VANCOCIN) 1,500 mg in sodium chloride 0.9 % 500 mL IVPB  Status:  Discontinued     1,500 mg 250 mL/hr over 120 Minutes Intravenous Every 24 hours 06/26/18 0446 06/28/18 1225   06/26/18 0600  clindamycin (CLEOCIN) IVPB 600 mg  Status:  Discontinued     600 mg 100 mL/hr over 30 Minutes Intravenous Every 8 hours 06/26/18 0508 06/28/18 1225   06/26/18 0415  clindamycin (CLEOCIN) IVPB 300  mg  Status:  Discontinued     300 mg 100 mL/hr over 30 Minutes Intravenous Every 8 hours 06/26/18 0356 06/26/18 0508   06/26/18 0400  cefTRIAXone (ROCEPHIN) 2 g in sodium chloride 0.9 % 100 mL IVPB  Status:  Discontinued     2 g 200 mL/hr over 30 Minutes Intravenous Every 24 hours 06/26/18 0356 06/26/18 0409   06/25/18 2330  vancomycin (VANCOCIN) IVPB 1000 mg/200 mL premix     1,000 mg 200 mL/hr over 60 Minutes Intravenous  Once 06/25/18 1958 06/26/18 0015   06/25/18 1500  clindamycin (CLEOCIN) IVPB 600 mg     600 mg 100 mL/hr over 30 Minutes Intravenous  Once 06/25/18 1453 06/25/18 1657   06/25/18 1330  vancomycin (VANCOCIN) IVPB 1000 mg/200 mL premix     1,000 mg 200 mL/hr over 60 Minutes Intravenous  Once 06/25/18 1318 06/25/18 1606   06/25/18 1330  cefTRIAXone (ROCEPHIN) 2 g in  sodium chloride 0.9 % 100 mL IVPB     2 g 200 mL/hr over 30 Minutes Intravenous Every 24 hours 06/25/18 1318         Assessment/Plan Uncontrolled DM- A1c 12.1 HTN HLD CAD H/o STEMI 2011 s/p RCA stent COPD Acute on chronic anemia  Urinary retention   Sepsis Necrotizing soft tissue infection POD 6/4 S/p incision and drainage perirectal abscess 2/25 Dr. Maisie Fus S/p EXCISIONAL DEBRIDEMENT 2/27 Dr. Maisie Fus S/p EXCISIONAL DEBRIDEMENT, dressing change 3/2 Dr. Dwain Sarna - intraop cultures:RARE ESCHERICHIA COLI, resistant to cipro only - appreciate ID assistance with abx - BID wet to dry dressing change  ID -rocephin/flagyl 2/25>> , clindamycin 2/25>>2/28, vancomycin 2/25>>2/28 VTE -SCDs, lovenox FEN -IVF, CM diet, NPO after midnight Foley - in place for retention Follow up -TBD  Plan: Return to OR tomorrow for further debridement and dressing change.   LOS: 7 days    Franne Forts , [facility] 07/02/2018, 8:32 AM Pager: [phone] Mon-Thurs 7:00 am-4:30 pm Fri 7:00 am -11:30 AM Sat-Sun 7:00 am-11:30 am

## 2018-07-02 NOTE — Progress Notes (Signed)
Physical Therapy Treatment Patient Details Name: Courtney [name] MRN: [id number] DOB: 1945-06-17 Today's Date: 07/02/2018    History of Present Illness 73 y.o. female admitted on 06/25/18 for boil on her left buttock with multiple recent falls.  Pt s/p I and D of fournier's gangrene in perirectal abcess 06/25/18 and repeat I and D  on 06/27/18.  Pt with significant PMH of RLS, MI, HTN, DM, COPD, CAD, and arthritis.  Repeat I&D performed 07/01/2018.    PT Comments    Patient progressing to OOB again despite just returning to surgery for I&D again yesterday.  She needs extensive assistance for mobility, sitting balance and able to participate with OT for simple ADL seated in recliner.  Feel she remains appropriate for SNF level rehab at d/c.  PT to follow.    Follow Up Recommendations  SNF     Equipment Recommendations  None recommended by PT    Recommendations for Other Services       Precautions / Restrictions Precautions Precautions: Fall Precaution Comments: recent h/o 3 falls (once in the ED and two at home).     Mobility  Bed Mobility Overal bed mobility: Needs Assistance Bed Mobility: Supine to Sit     Supine to sit: HOB elevated;Max assist;+2 for physical assistance     General bed mobility comments: assist and increased time to move legs off bed so assisted with pad under pt to scoot hips to EOB then assist to lift trunk  Transfers Overall transfer level: Needs assistance Equipment used: 2 person hand held assist Transfers: Sit to/from Stand;Stand Pivot Transfers Sit to Stand: +2 physical assistance;Mod assist;+2 safety/equipment Stand pivot transfers: +2 physical assistance;+2 safety/equipment;Mod assist       General transfer comment: lifting assist from EOB, pt flexed but holding onto PT/OT to take steps to chair   Ambulation/Gait                 Stairs             Wheelchair Mobility    Modified Rankin (Stroke Patients Only)       Balance  Overall balance assessment: Needs assistance Sitting-balance support: Feet supported Sitting balance-Leahy Scale: Poor Sitting balance - Comments: sitting initially with support, then with S, but fell back on bed with mod A to recover   Standing balance support: Bilateral upper extremity supported Standing balance-Leahy Scale: Poor Standing balance comment: Heavy UE support needed                             Cognition Arousal/Alertness: Awake/alert Behavior During Therapy: WFL for tasks assessed/performed Overall Cognitive Status: Within Functional Limits for tasks assessed                                        Exercises      General Comments        Pertinent Vitals/Pain Pain Assessment: Faces Faces Pain Scale: Hurts whole lot Pain Location: buttocks with certain movements Pain Descriptors / Indicators: Moaning;Grimacing Pain Intervention(s): Monitored during session;Repositioned;Limited activity within patient's tolerance;RN gave pain meds during session    Home Living                      Prior Function            PT Goals (current goals can now be  found in the care plan section) Progress towards PT goals: Progressing toward goals    Frequency    Min 2X/week      PT Plan Current plan remains appropriate    Co-evaluation PT/OT/SLP Co-Evaluation/Treatment: Yes Reason for Co-Treatment: For patient/therapist safety;Complexity of the patient's impairments (multi-system involvement) PT goals addressed during session: Mobility/safety with mobility        AM-PAC PT "6 Clicks" Mobility   Outcome Measure  Help needed turning from your back to your side while in a flat bed without using bedrails?: Total Help needed moving from lying on your back to sitting on the side of a flat bed without using bedrails?: Total Help needed moving to and from a bed to a chair (including a wheelchair)?: Total Help needed standing up from a  chair using your arms (e.g., wheelchair or bedside chair)?: Total Help needed to walk in hospital room?: Total Help needed climbing 3-5 steps with a railing? : Total 6 Click Score: 6    End of Session Equipment Utilized During Treatment: Gait belt Activity Tolerance: Patient limited by fatigue;Patient limited by pain Patient left: in chair;with family/visitor present;with call bell/phone within reach Nurse Communication: Mobility status;Need for lift equipment PT Visit Diagnosis: Other abnormalities of gait and mobility (R26.89);Difficulty in walking, not elsewhere classified (R26.2);Muscle weakness (generalized) (M62.81);Pain Pain - Right/Left: (buttocks)     Time: 1340-1410 PT Time Calculation (min) (ACUTE ONLY): 30 min  Charges:  $Therapeutic Activity: 8-22 mins                     Sheran Lawless, [facility] [phone] 07/02/2018    [name] 07/02/2018, 3:39 PM

## 2018-07-02 NOTE — Progress Notes (Signed)
PROGRESS NOTE    Courtney [name]  WUJ:[id number] DOB: August 15, 1945 DOA: 06/25/2018 PCP: Jordan Hawks, PA-C   Brief Narrative: Patient is a 73 year old female with history of uncontrolled type diabetes mellitus, coronary artery disease, OSA on CPAP, asthma, COPD who presents to the emergency department with complaints of generalized weakness, gluteal abscess for several days.  She was found to be septic with Fornage gangrene/NF and was taken emergently to the OR.  She underwent  repeat debridement  by general surgery on 06/27/18.ID also consulted for the guidance for antibiotics. She has underwent excisional debridement  by surgery on 07/01/18. Planning for another debridement on 07/03/2018.  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  Repeat I&D on 2/27.  Currently on ceftriaxone and flagyl.  Negative blood cultures.  So far showing Ecoli and anareobes on wound cultures.  Wound care as per general surgery. ID consultation done  for recommendation on antibiotics.  Still has significant leukocytosis.  Afebrile. Underwent excisional  debridement on 07/01/18 and general surgery planning for another debridement on 07/03/2018.  Stage II pressure ulcers of the right and left medial sacrum/right groin: Present on admission.  Wound care as per general surgery.  Optimize nutritional status.  Uncontrolled type 2 diabetes mellitus: Hemoglobin A1c of 12.1.  Follows with endocrinology.  Continue Lantus and sliding scale  insulin for now.  Diabetic coordinator  following.  COPD/asthma: Not on  any home medications.  Currently COPD is stable.  We will put her on albuterol inhaler on discharge.  Follow-up with pulmonology as an outpatient.  OSA: Continue CPAP at night.  Coronary artery disease: Continue metoprolol, statin.  Not on aspirin at home.  Hypertension: Continue metoprolol at increased dose .  Added lisinopril.PRN meds.  BP fluctuating.  Hyperlipidemia: Continue statin  Obesity: BMI of 36.  Dietitian following.  Nutrition Problem: Increased nutrient needs Etiology: wound healing, acute illness, post-op healing      DVT prophylaxis:Lovenox Code Status: Full Family Communication: None present at the bedside Disposition Plan: SNF after full  Management,surgical clearance   Consultants: General surgery,ID  Procedures:Multiple  I&D  Antimicrobials:  Anti-infectives (From admission, onward)   Start     Dose/Rate Route Frequency Ordered Stop   06/28/18 1400  metroNIDAZOLE (FLAGYL) tablet 500 mg     500 mg Oral Every 8 hours 06/28/18 1225     06/26/18 2200  vancomycin (VANCOCIN) 1,500 mg in sodium chloride 0.9 % 500 mL IVPB  Status:  Discontinued     1,500 mg 250 mL/hr over 120 Minutes Intravenous Every 24 hours 06/26/18 0446 06/28/18 1225   06/26/18 0600  clindamycin (CLEOCIN) IVPB 600 mg  Status:  Discontinued     600 mg 100 mL/hr over 30 Minutes Intravenous Every 8 hours 06/26/18 0508 06/28/18 1225   06/26/18 0415  clindamycin (CLEOCIN) IVPB 300 mg  Status:  Discontinued     300 mg 100 mL/hr over 30 Minutes Intravenous Every 8 hours 06/26/18 0356 06/26/18 0508   06/26/18 0400  cefTRIAXone (ROCEPHIN) 2 g in sodium  chloride 0.9 % 100 mL IVPB  Status:  Discontinued     2 g 200 mL/hr over 30 Minutes Intravenous Every 24 hours 06/26/18 0356 06/26/18 0409   06/25/18 2330  vancomycin (VANCOCIN) IVPB 1000 mg/200 mL premix     1,000 mg 200 mL/hr over 60 Minutes Intravenous  Once 06/25/18 1958 06/26/18 0015   06/25/18 1500  clindamycin (CLEOCIN) IVPB 600 mg     600  mg 100 mL/hr over 30 Minutes Intravenous  Once 06/25/18 1453 06/25/18 1657   06/25/18 1330  vancomycin (VANCOCIN) IVPB 1000 mg/200 mL premix     1,000 mg 200 mL/hr over 60 Minutes Intravenous  Once 06/25/18 1318 06/25/18 1606   06/25/18 1330  cefTRIAXone (ROCEPHIN) 2 g in sodium chloride 0.9 % 100 mL IVPB     2 g 200 mL/hr over 30 Minutes Intravenous Every 24 hours 06/25/18 1318        Subjective: Patient seen and examined the bedside this morning.  Remains comfortable.  Denies pain.  Noted to have swelling of right upper extremity.  Surgery planning for another department tomorrow.  Objective: Vitals:   07/02/18 0700 07/02/18 0800 07/02/18 0900 07/02/18 1000  BP:  (!) 157/70    Pulse: 87 85 93 91  Resp: 18 16 19 13   Temp: 98.6 F (37 C) 98.6 F (37 C) 98.4 F (36.9 C) 98.6 F (37 C)  TempSrc:  Bladder    SpO2: 97% 98% 98% 100%  Weight:      Height:        Intake/Output Summary (Last 24 hours) at 07/02/2018 1142 Last data filed at 07/02/2018 0600 Gross per 24 hour  Intake 2363.01 ml  Output 1175 ml  Net 1188.01 ml     Examination:   General exam: Appears calm and comfortable ,Not in distress,obese HEENT:PERRL,Oral mucosa moist, Ear/Nose normal on gross exam Respiratory system: Bilateral equal air entry, normal vesicular breath sounds, no wheezes or crackles  Cardiovascular system: S1 & S2 heard, RRR. No JVD, murmurs, rubs, gallops or clicks. Gastrointestinal system: Abdomen is nondistended, soft and nontender. No organomegaly or masses felt. Normal bowel sounds heard. Central nervous system: Alert and oriented. No focal neurological deficits. Extremities: Edema of the right upper extremity   Skin/GU: Perineal wound wrapped with dressings           Data Reviewed: I have personally reviewed following labs and imaging studies  CBC: Recent Labs  Lab 06/25/18 2230  06/28/18 0345 06/29/18 0453 06/30/18 0610 07/01/18 0458 07/02/18 0500  WBC 42.3*   < >  23.8* 24.6* 24.4* 27.5* 30.0*  NEUTROABS 40.2*  --  22.6*  --  19.8* 22.0* 25.1*  HGB 11.7*   < > 9.4* 9.0* 9.2* 9.3* 8.6*  HCT 39.0   < > 32.1* 31.1* 31.7* 30.9* 29.4*  MCV 82.8   < > 83.2 84.7 84.1 82.6 83.1  PLT 217   < > 236 248 269 266 285   < > = values in this interval not displayed.   Basic Metabolic Panel: Recent Labs  Lab 06/26/18 0745 06/27/18 0559 06/28/18 0345 06/29/18 0453 07/01/18 0458  NA 135 136 137 138 135  K 3.4* 3.6 3.8 4.1 3.8  CL 102 105 108 110 103  CO2 24 23 25 23 25   GLUCOSE 100* 225* 121* 152* 166*  BUN 21 19 13 15 12   CREATININE 0.68 0.63 0.58 0.50 0.51  CALCIUM 8.3* 8.4* 8.1* 8.1* 8.5*   GFR: Estimated Creatinine Clearance: 70.6 mL/min (  by C-G formula based on SCr of 0.51 mg/dL). Liver Function Tests: Recent Labs  Lab 06/25/18 1947  AST 44*  ALT 50*  ALKPHOS 147*  BILITOT 0.4  PROT 5.3*  ALBUMIN 1.9*   No results for input(s): LIPASE, AMYLASE in the last 168 hours. No results for input(s): AMMONIA in the last 168 hours. Coagulation Profile: Recent Labs  Lab 06/25/18 1859 06/25/18 1947  INR 1.2 1.2   Cardiac Enzymes: Recent Labs  Lab 06/25/18 1209  TROPONINI 0.03*   BNP (last 3 results) No results for input(s): PROBNP in the last 8760 hours. HbA1C: No results for input(s): HGBA1C in the last 72 hours. CBG: Recent Labs  Lab 07/01/18 0753 07/01/18 1246 07/01/18 1540 07/01/18 2136 07/02/18 0749  GLUCAP 165* 110* 131* 150* 111*   Lipid Profile: No results for input(s): CHOL, HDL, LDLCALC, TRIG, CHOLHDL, LDLDIRECT in the last 72 hours. Thyroid Function Tests: No results for input(s): TSH, T4TOTAL, FREET4, T3FREE, THYROIDAB in the last 72 hours. Anemia Panel: No results for input(s): VITAMINB12, FOLATE, FERRITIN, TIBC, IRON, RETICCTPCT in the last 72 hours. Sepsis Labs: Recent Labs  Lab 06/25/18 1211 06/25/18 1554 06/25/18 1947 06/26/18 0100  PROCALCITON  --   --  8.03  --   LATICACIDVEN 5.0* 3.2* 2.7* 1.5     Recent Results (from the past 240 hour(s))  Culture, blood (routine x 2)     Status: None   Collection Time: 06/25/18 12:10 PM  Result Value Ref Range Status   Specimen Description   Final    BLOOD LEFT ANTECUBITAL Performed at [facility], [address] [place], [place] [address]    Special Requests   Final    BOTTLES DRAWN AEROBIC AND ANAEROBIC Blood Culture adequate volume Performed at [facility], [address], [place], [place] [address]    Culture   Final    NO GROWTH 5 DAYS Performed at [facility], [address] [place], [place] [address]    Report Status 06/30/2018 FINAL  Final  Culture, blood (routine x 2)     Status: None   Collection Time: 06/25/18  2:06 PM  Result Value Ref Range Status   Specimen Description   Final    BLOOD LEFT ANTECUBITAL Performed at [facility], [address] [place], [place] [address]    Special Requests   Final    BOTTLES DRAWN AEROBIC AND ANAEROBIC Blood Culture adequate volume Performed at [facility], [address] [place], [place] [address]    Culture   Final    NO GROWTH 5 DAYS Performed at [facility], [address] [place], [place] [address]    Report Status 06/30/2018 FINAL  Final  Aerobic/Anaerobic Culture (surgical/deep wound)     Status: None   Collection Time: 06/25/18  5:18 PM  Result Value Ref Range Status   Specimen Description   Final    ABSCESS Performed at [facility], [address] [place], [place] [address]    Special Requests RECTAL  Final   Gram Stain   Final    FEW WBC PRESENT, PREDOMINANTLY PMN ABUNDANT GRAM NEGATIVE RODS ABUNDANT GRAM POSITIVE COCCI Performed at [facility], [address], [place], [place] [address]    Culture   Final    RARE ESCHERICHIA COLI MIXED ANAEROBIC FLORA PRESENT.  CALL LAB IF FURTHER IID REQUIRED.    Report Status 06/29/2018 FINAL  Final  Organism ID, Bacteria ESCHERICHIA COLI  Final      Susceptibility   Escherichia coli - MIC*    AMPICILLIN 16 INTERMEDIATE Intermediate     CEFAZOLIN <=4 SENSITIVE Sensitive     CEFEPIME <=1 SENSITIVE Sensitive     CEFTAZIDIME <=1 SENSITIVE Sensitive     CEFTRIAXONE <=1 SENSITIVE Sensitive     CIPROFLOXACIN >=4 RESISTANT Resistant     GENTAMICIN <=1 SENSITIVE Sensitive     IMIPENEM <=0.25 SENSITIVE Sensitive     TRIMETH/SULFA <=20 SENSITIVE Sensitive     AMPICILLIN/SULBACTAM 4 SENSITIVE Sensitive     PIP/TAZO <=4 SENSITIVE Sensitive     Extended ESBL NEGATIVE Sensitive     * RARE ESCHERICHIA COLI  Culture, blood (x 2)     Status: None   Collection Time: 06/25/18  7:52 PM  Result Value Ref Range Status   Specimen Description   Final    BLOOD ARTERIAL LINE Performed at [facility], [address] [place], [place] [address]    Special Requests   Final    BOTTLES DRAWN AEROBIC AND ANAEROBIC Blood Culture adequate volume Performed at [facility], [address], [place], [place] [address]    Culture   Final    NO GROWTH 5 DAYS Performed at [facility], [address], [place], [place] [address]    Report Status 06/30/2018 FINAL  Final  Culture, blood (x 2)     Status: None   Collection Time: 06/25/18  9:34 PM  Result Value Ref Range Status   Specimen Description BLOOD LEFT ARM  Final   Special Requests   Final    BOTTLES DRAWN AEROBIC ONLY Blood Culture results may not be optimal due to an inadequate volume of blood received in culture bottles   Culture   Final    NO GROWTH 5 DAYS Performed at [facility], [address] [place], [place] [address]    Report Status 06/30/2018 FINAL  Final  MRSA PCR Screening     Status: None   Collection Time: 06/25/18 10:19 PM  Result Value Ref Range Status   MRSA by PCR NEGATIVE NEGATIVE Final    Comment:        The GeneXpert MRSA Assay (FDA approved for NASAL specimens only), is  one component of a comprehensive MRSA colonization surveillance program. It is not intended to diagnose MRSA infection nor to guide or monitor treatment for MRSA infections. Performed at [facility], [address] [place], [place] [address]   Urine culture     Status: None   Collection Time: 06/26/18  1:35 PM  Result Value Ref Range Status   Specimen Description   Final    URINE, RANDOM Performed at [facility], [address] [place], [place] [address]    Special Requests   Final    Normal Performed at [facility], [address] [place], [place] [address]    Culture   Final    NO GROWTH Performed at [facility], [address], [place], [place] [address]    Report Status 06/27/2018 FINAL  Final         Radiology Studies: No results found.      Scheduled Meds: . chlorhexidine  15 mL Mouth Rinse BID  . Chlorhexidine Gluconate Cloth  6 each Topical Daily  . enoxaparin (LOVENOX) injection  40 mg Subcutaneous Q24H  . insulin aspart  0-5 Units Subcutaneous  QHS  . insulin aspart  0-9 Units Subcutaneous TID WC  . insulin glargine  25 Units Subcutaneous Daily  . lisinopril  10 mg Oral Daily  . mouth rinse  15 mL Mouth Rinse q12n4p  . metoprolol succinate  50 mg Oral Daily  . metroNIDAZOLE  500 mg Oral Q8H  . multivitamin with minerals  1 tablet Oral Daily  . nutrition supplement (JUVEN)  1 packet Oral BID BM  . ENSURE MAX PROTEIN  11 oz Oral BID  . rosuvastatin  20 mg Oral Daily  . sodium chloride flush  10-40 mL Intracatheter Q12H   Continuous Infusions: . cefTRIAXone (ROCEPHIN)  IV Stopped (07/01/18 1422)     LOS: 7 days    Time spent:25 mins. More than 50% of that time was spent in counseling and/or coordination of care.      Burnadette Pop, MD [facility] Pager [phone]  If 7PM-7AM, please contact night-coverage [email or web address] Password [email or web address] 07/02/2018, 11:42 AM

## 2018-07-02 NOTE — Progress Notes (Signed)
LCSW following for SNF placement.   LCSW provided patient with bed offers.   Awaiting bed choice once patient discuss with spouse.   LCSW will continue to follow.   Beulah Gandy [facility] CSW [phone]

## 2018-07-02 NOTE — Progress Notes (Signed)
Right upper extremity venous duplex exam completed. Please see preliminary notes on CV PROC under chart review. Rily Nickey H Cassity Christian(RDMS RVT) 07/02/18 12:58 PM

## 2018-07-02 NOTE — Progress Notes (Signed)
   07/02/18 2213 07/02/18 2222  Provider Notification  Provider Name/Title Schorr, NP  --   Date Provider Notified 07/02/18  --   Time Provider Notified 2213  --   Notification Type Page  --   Notification Reason Other (Comment) (test results)  --   Response Other (Comment) (awaiting response) No new orders;Other (Comment) (stated superficial DVTs are typically not treated)  Date of Provider Response  --  07/02/18  Time of Provider Response  --  2222   Paged Schorr, NP regarding venous duplex results. Dayshift RN say no new orders were received and this RN wanted to clarify this. Due to being superficial in nature and ID physician noted it in his note, no new orders at this time.

## 2018-07-02 NOTE — Progress Notes (Signed)
Patient requested to move from chair back to bed.  Patient was moved to chair with 2 assist and aid of walker.  The walker and 2 assist was used to transfer patient back to bed.  During the transfer the patient buckled at the knees and the nurse and tech were supporting the patients weight.  The patient was guided to her knees and the nurse called for help.  The sky lift harness was placed on floor and the patient was guided to the harness and then transferred to bed with the aid of the lift.  Fall to the floor was controlled. Patient did not hit her head, and remained alert and oriented.  Post fall vitals were with in an appropriate range.   Charge nurse contacted, attending notified, post fall huddle conducted.

## 2018-07-02 NOTE — Progress Notes (Signed)
Occupational Therapy Treatment Patient Details Name: Courtney [name] MRN: [id number] DOB: [birth year] Today's Date: 07/02/2018    History of present illness 73 y.o. female admitted on 06/25/18 for boil on her left buttock with multiple recent falls.  Pt s/p I and D of fournier's gangrene in perirectal abcess 06/25/18 and repeat I and D  on 06/27/18.  Pt with significant PMH of RLS, MI, HTN, DM, COPD, CAD, and arthritis.  Repeat I&D performed 07/01/2018.   OT comments  Pt able to perform SPT to chair today with assistance. Worked on grooming and RUE AAROM exercises   Follow Up Recommendations  SNF    Equipment Recommendations  None recommended by OT    Recommendations for Other Services      Precautions / Restrictions Precautions Precautions: Fall Precaution Comments: recent h/o 3 falls (once in the ED and two at home).  Restrictions Weight Bearing Restrictions: No       Mobility Bed Mobility Overal bed mobility: Needs Assistance Bed Mobility: Supine to Sit     Supine to sit: HOB elevated;Max assist;+2 for physical assistance     General bed mobility comments: assist and increased time to move legs off bed so assisted with pad under pt to scoot hips to EOB then assist to lift trunk  Transfers Overall transfer level: Needs assistance Equipment used: 2 person hand held assist Transfers: Sit to/from Stand;Stand Pivot Transfers Sit to Stand: +2 physical assistance;Mod assist;+2 safety/equipment Stand pivot transfers: +2 physical assistance;+2 safety/equipment;Mod assist       General transfer comment: lifting assist from EOB, pt flexed but holding onto PT/OT to take steps to chair     Balance Overall balance assessment: Needs assistance Sitting-balance support: Feet supported Sitting balance-Leahy Scale: Poor Sitting balance - Comments: sitting initially with support, then with S, but fell back on bed with mod A to recover   Standing balance support: Bilateral upper  extremity supported Standing balance-Leahy Scale: Poor Standing balance comment: Heavy UE support needed                            ADL either performed or assessed with clinical judgement   ADL       Grooming: Oral care;Minimal assistance;Sitting                                 General ADL Comments: performed oral care from chair.  Min A to manage cup and emesis basin.  Applied toothpaste directly to mouth     Vision       Perception     Praxis      Cognition Arousal/Alertness: Awake/alert Behavior During Therapy: WFL for tasks assessed/performed Overall Cognitive Status: Within Functional Limits for tasks assessed                                          Exercises Exercises: Other exercises Other Exercises Other Exercises: AAROM to R shoulder, FF x 6 reps Other Exercises: encouraged making a fist with RUE for hand edema   Shoulder Instructions       General Comments      Pertinent Vitals/ Pain       Pain Assessment: Faces Faces Pain Scale: Hurts whole lot Pain Location: buttocks with certain movements Pain Descriptors / Indicators: Moaning;Grimacing Pain  Intervention(s): Limited activity within patient's tolerance;Monitored during session;Repositioned;Premedicated before session  Home Living                                          Prior Functioning/Environment              Frequency  Min 2X/week        Progress Toward Goals  OT Goals(current goals can now be found in the care plan section)  Progress towards OT goals: Progressing toward goals     Plan      Co-evaluation    PT/OT/SLP Co-Evaluation/Treatment: Yes Reason for Co-Treatment: For patient/therapist safety PT goals addressed during session: Mobility/safety with mobility OT goals addressed during session: ADL's and self-care      AM-PAC OT "6 Clicks" Daily Activity     Outcome Measure   Help from another person  eating meals?: A Little Help from another person taking care of personal grooming?: A Little Help from another person toileting, which includes using toliet, bedpan, or urinal?: A Lot Help from another person bathing (including washing, rinsing, drying)?: A Lot Help from another person to put on and taking off regular upper body clothing?: A Lot Help from another person to put on and taking off regular lower body clothing?: Total 6 Click Score: 13    End of Session    OT Visit Diagnosis: History of falling (Z91.81);Muscle weakness (generalized) (M62.81)   Activity Tolerance Patient limited by fatigue   Patient Left in chair;with call bell/phone within reach;with family/visitor present   Nurse Communication          Time: 8889-1694 OT Time Calculation (min): 23 min  Charges: OT General Charges $OT Visit: 1 Visit OT Treatments $Self Care/Home Management : 8-22 mins  Marica Otter, OTR/L Acute Rehabilitation Services [phone] [facility] pager [phone] office 07/02/2018   Courtney [name] 07/02/2018, 3:47 PM

## 2018-07-02 NOTE — Progress Notes (Signed)
INFECTIOUS DISEASE PROGRESS NOTE  ID: Courtney [name] is a 73 y.o. 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 Active Problems:   HTN (hypertension)   Normocytic anemia   HLD (hyperlipidemia)   Restless leg syndrome   COPD (chronic obstructive pulmonary disease) (HCC)   CAD (coronary artery disease)   OSA and COPD overlap syndrome (HCC)   Pressure injury of skin   DM (diabetes mellitus) (HCC)   Cigarette smoker   Sepsis (HCC)  Subjective: No complaints  Abtx:  Anti-infectives (From admission, onward)   Start     Dose/Rate Route Frequency Ordered Stop   06/28/18 1400  metroNIDAZOLE (FLAGYL) tablet 500 mg     500 mg Oral Every 8 hours 06/28/18 1225     06/26/18 2200  vancomycin (VANCOCIN) 1,500 mg in sodium chloride 0.9 % 500 mL IVPB  Status:  Discontinued     1,500 mg 250 mL/hr over 120 Minutes Intravenous Every 24 hours 06/26/18 0446 06/28/18 1225   06/26/18 0600  clindamycin (CLEOCIN) IVPB 600 mg  Status:  Discontinued     600 mg 100 mL/hr over 30 Minutes Intravenous Every 8 hours 06/26/18 0508 06/28/18 1225   06/26/18 0415  clindamycin (CLEOCIN) IVPB 300 mg  Status:  Discontinued     300 mg 100 mL/hr over 30 Minutes Intravenous Every 8 hours 06/26/18 0356 06/26/18 0508   06/26/18 0400  cefTRIAXone (ROCEPHIN) 2 g in sodium chloride 0.9 % 100 mL IVPB  Status:  Discontinued     2 g 200 mL/hr over 30 Minutes Intravenous Every 24 hours 06/26/18 0356 06/26/18 0409   06/25/18 2330  vancomycin (VANCOCIN) IVPB 1000 mg/200 mL premix     1,000 mg 200 mL/hr over 60 Minutes Intravenous  Once 06/25/18 1958 06/26/18 0015   06/25/18 1500  clindamycin (CLEOCIN) IVPB 600 mg     600 mg 100 mL/hr over 30 Minutes Intravenous  Once 06/25/18 1453 06/25/18 1657   06/25/18 1330  vancomycin (VANCOCIN) IVPB 1000 mg/200 mL premix     1,000 mg 200 mL/hr over 60 Minutes Intravenous  Once 06/25/18 1318 06/25/18 1606   06/25/18 1330  cefTRIAXone (ROCEPHIN) 2 g in sodium  chloride 0.9 % 100 mL IVPB     2 g 200 mL/hr over 30 Minutes Intravenous Every 24 hours 06/25/18 1318        Medications:  Scheduled: . chlorhexidine  15 mL Mouth Rinse BID  . Chlorhexidine Gluconate Cloth  6 each Topical Daily  . enoxaparin (LOVENOX) injection  40 mg Subcutaneous Q24H  . insulin aspart  0-5 Units Subcutaneous QHS  . insulin aspart  0-9 Units Subcutaneous TID WC  . insulin glargine  25 Units Subcutaneous Daily  . lisinopril  10 mg Oral Daily  . mouth rinse  15 mL Mouth Rinse q12n4p  . metoprolol succinate  50 mg Oral Daily  . metroNIDAZOLE  500 mg Oral Q8H  . multivitamin with minerals  1 tablet Oral Daily  . nutrition supplement (JUVEN)  1 packet Oral BID BM  . ENSURE MAX PROTEIN  11 oz Oral BID  . rosuvastatin  20 mg Oral Daily  . sodium chloride flush  10-40 mL Intracatheter Q12H    Objective: Vital signs in last 24 hours: Temp:  [98.4 F (36.9 C)-99.1 F (37.3 C)] 98.8 F (37.1 C) (03/03 1200) Pulse Rate:  [84-93] 93 (03/03 1300) Resp:  [13-23] 20 (03/03 1300) BP: (124-161)/(57-92) 124/66 (03/03 1200) SpO2:  [97 %-100 %]  100 % (03/03 1300)   General appearance: alert, cooperative and no distress Neck: R IJ Resp: clear to auscultation bilaterally Cardio: regular rate and rhythm GI: normal findings: bowel sounds normal and soft, non-tender Extremities: edema +  Lab Results Recent Labs    07/01/18 0458 07/02/18 0500  WBC 27.5* 30.0*  HGB 9.3* 8.6*  HCT 30.9* 29.4*  NA 135  --   K 3.8  --   CL 103  --   CO2 25  --   BUN 12  --   CREATININE 0.51  --    Liver Panel No results for input(s): PROT, ALBUMIN, AST, ALT, ALKPHOS, BILITOT, BILIDIR, IBILI in the last 72 hours. Sedimentation Rate No results for input(s): ESRSEDRATE in the last 72 hours. C-Reactive Protein No results for input(s): CRP in the last 72 hours.  Microbiology: Recent Results (from the past 240 hour(s))  Culture, blood (routine x 2)     Status: None   Collection Time:  06/25/18 12:10 PM  Result Value Ref Range Status   Specimen Description   Final    BLOOD LEFT ANTECUBITAL Performed at [facility], [address], [place], [place] [address]    Special Requests   Final    BOTTLES DRAWN AEROBIC AND ANAEROBIC Blood Culture adequate volume Performed at [facility], [address] [place], [place] [address]    Culture   Final    NO GROWTH 5 DAYS Performed at [facility], [address] [place], [place] [address]    Report Status 06/30/2018 FINAL  Final  Culture, blood (routine x 2)     Status: None   Collection Time: 06/25/18  2:06 PM  Result Value Ref Range Status   Specimen Description   Final    BLOOD LEFT ANTECUBITAL Performed at [facility], [address], [place], [place] [address]    Special Requests   Final    BOTTLES DRAWN AEROBIC AND ANAEROBIC Blood Culture adequate volume Performed at [facility], [address] [place], [place] [address]    Culture   Final    NO GROWTH 5 DAYS Performed at [facility], [address], [place], [place] [address]    Report Status 06/30/2018 FINAL  Final  Aerobic/Anaerobic Culture (surgical/deep wound)     Status: None   Collection Time: 06/25/18  5:18 PM  Result Value Ref Range Status   Specimen Description   Final    ABSCESS Performed at [facility], [address], [place], [place] [address]    Special Requests RECTAL  Final   Gram Stain   Final    FEW WBC PRESENT, PREDOMINANTLY PMN ABUNDANT GRAM NEGATIVE RODS ABUNDANT GRAM POSITIVE COCCI Performed at [facility], [address] [place], [place] [address]    Culture   Final    RARE ESCHERICHIA COLI MIXED ANAEROBIC FLORA PRESENT.  CALL LAB IF FURTHER IID REQUIRED.    Report Status 06/29/2018 FINAL  Final   Organism ID, Bacteria ESCHERICHIA COLI  Final      Susceptibility   Escherichia coli - MIC*    AMPICILLIN 16  INTERMEDIATE Intermediate     CEFAZOLIN <=4 SENSITIVE Sensitive     CEFEPIME <=1 SENSITIVE Sensitive     CEFTAZIDIME <=1 SENSITIVE Sensitive     CEFTRIAXONE <=1 SENSITIVE Sensitive     CIPROFLOXACIN >=4 RESISTANT Resistant     GENTAMICIN <=1 SENSITIVE Sensitive  IMIPENEM <=0.25 SENSITIVE Sensitive     TRIMETH/SULFA <=20 SENSITIVE Sensitive     AMPICILLIN/SULBACTAM 4 SENSITIVE Sensitive     PIP/TAZO <=4 SENSITIVE Sensitive     Extended ESBL NEGATIVE Sensitive     * RARE ESCHERICHIA COLI  Culture, blood (x 2)     Status: None   Collection Time: 06/25/18  7:52 PM  Result Value Ref Range Status   Specimen Description   Final    BLOOD ARTERIAL LINE Performed at [facility], [address] [place], [place] [address]    Special Requests   Final    BOTTLES DRAWN AEROBIC AND ANAEROBIC Blood Culture adequate volume Performed at [facility], [address] [place], [place] [address]    Culture   Final    NO GROWTH 5 DAYS Performed at [facility], [address] [place], [place] [address]    Report Status 06/30/2018 FINAL  Final  Culture, blood (x 2)     Status: None   Collection Time: 06/25/18  9:34 PM  Result Value Ref Range Status   Specimen Description BLOOD LEFT ARM  Final   Special Requests   Final    BOTTLES DRAWN AEROBIC ONLY Blood Culture results may not be optimal due to an inadequate volume of blood received in culture bottles   Culture   Final    NO GROWTH 5 DAYS Performed at [facility], [address], [place], [place] [address]    Report Status 06/30/2018 FINAL  Final  MRSA PCR Screening     Status: None   Collection Time: 06/25/18 10:19 PM  Result Value Ref Range Status   MRSA by PCR NEGATIVE NEGATIVE Final    Comment:        The GeneXpert MRSA Assay (FDA approved for NASAL specimens only), is one component of a comprehensive MRSA colonization surveillance program. It is not intended to diagnose  MRSA infection nor to guide or monitor treatment for MRSA infections. Performed at [facility], [address] [place], [place] [address]   Urine culture     Status: None   Collection Time: 06/26/18  1:35 PM  Result Value Ref Range Status   Specimen Description   Final    URINE, RANDOM Performed at [facility], [address] [place], [place] [address]    Special Requests   Final    Normal Performed at [facility], [address], [place], [place] [address]    Culture   Final    NO GROWTH Performed at [facility], [address], [place], [place] [address]    Report Status 06/27/2018 FINAL  Final    Studies/Results: Vas [place] Upper Extremity Venous Duplex  Result Date: 07/02/2018 UPPER VENOUS STUDY  Indications: rule out DVT Limitations: Central line on site; immobility of patient's right arm. Comparison Study: No comparison study available. Performing Technologist: Melodie Bouillon  Examination Guidelines: A complete evaluation includes B-mode imaging, spectral Doppler, color Doppler, and power Doppler as needed of all accessible portions of each vessel. Bilateral testing is considered an integral part of a complete examination. Limited examinations for reoccurring indications may be performed as noted.  Right Findings: +----------+------------+----------+---------+-----------+---------------------+ RIGHT     CompressiblePropertiesPhasicitySpontaneous       Summary        +----------+------------+----------+---------+-----------+---------------------+ IJV  Yes       Yes     Not well visualized                                                      with compression due                                                       to central line on                                                               site           +----------+------------+----------+---------+-----------+---------------------+ Subclavian    Full                 Yes       Yes                          +----------+------------+----------+---------+-----------+---------------------+ Axillary      Full                 Yes       Yes                          +----------+------------+----------+---------+-----------+---------------------+ Brachial      Full                 Yes       Yes                          +----------+------------+----------+---------+-----------+---------------------+ Radial                                                 Not visualized     +----------+------------+----------+---------+-----------+---------------------+ Ulnar                                                  Not visualized     +----------+------------+----------+---------+-----------+---------------------+ Cephalic    Partial                                         Acute         +----------+------------+----------+---------+-----------+---------------------+ Basilic       Full                 Yes       Yes                          +----------+------------+----------+---------+-----------+---------------------+ Cephalic: distal  to forearm unable to compress.  Left Findings: +----------+------------+----------+---------+-----------+-------+ LEFT      CompressiblePropertiesPhasicitySpontaneousSummary +----------+------------+----------+---------+-----------+-------+ Subclavian    Full                 Yes       Yes            +----------+------------+----------+---------+-----------+-------+  Summary:  Right: No evidence of thrombosis in the subclavian. Findings consistent with acute superficial vein thrombosis involving the right cephalic vein distal segment . This was a limited study due to patient cannot move her arm with tightedness position, distal segment of brachial veins unable to exam .  Left: No evidence of  thrombosis in the subclavian.  *See table(s) above for measurements and observations.    Preliminary      Assessment/Plan: Fournier's Gangrene             Debrided 3-3, 2-27             Cx E coli, mixed anaerobes DM2, poorly controlled             A1C 12.1% HTN Superficial R DVT   Total days of antibiotics: 8 (ceftriaxone/flagyl)  Back to OR tomorrow Will continue current anbx Watch WBC Despite her WBC she clinically appears well, up in chair.          Johny Sax MD, FACP Infectious Diseases (pager) [phone] [email or web address] 07/02/2018, 2:34 PM  LOS: 7 days

## 2018-07-03 ENCOUNTER — Inpatient Hospital Stay: Payer: Self-pay

## 2018-07-03 ENCOUNTER — Encounter (HOSPITAL_COMMUNITY)
Admission: EM | Disposition: A | Discharge status: Other Acute Inpatient Hospital | Payer: Self-pay | Source: Home / Self Care | Attending: Internal Medicine

## 2018-07-03 ENCOUNTER — Inpatient Hospital Stay (HOSPITAL_COMMUNITY): Payer: Medicare Other | Admitting: Certified Registered Nurse Anesthetist

## 2018-07-03 ENCOUNTER — Encounter (HOSPITAL_COMMUNITY): Payer: Self-pay | Admitting: Certified Registered Nurse Anesthetist

## 2018-07-03 DIAGNOSIS — I82621 Acute embolism and thrombosis of deep veins of right upper extremity: Secondary | ICD-10-CM

## 2018-07-03 HISTORY — PX: WOUND DEBRIDEMENT: SHX247

## 2018-07-03 LAB — GLUCOSE, CAPILLARY
Glucose-Capillary: 140 mg/dL — ABNORMAL HIGH (ref 70–99)
Glucose-Capillary: 130 mg/dL — ABNORMAL HIGH (ref 70–99)
Glucose-Capillary: 112 mg/dL — ABNORMAL HIGH (ref 70–99)
Glucose-Capillary: 162 mg/dL — ABNORMAL HIGH (ref 70–99)
Glucose-Capillary: 213 mg/dL — ABNORMAL HIGH (ref 70–99)

## 2018-07-03 LAB — BASIC METABOLIC PANEL
Anion gap: 4 — ABNORMAL LOW (ref 5–15)
Anion gap: 4 — ABNORMAL LOW (ref 5–15)
BUN: 13 mg/dL (ref 8–23)
BUN: 14 mg/dL (ref 8–23)
CO2: 27 mmol/L (ref 22–32)
CO2: 27 mmol/L (ref 22–32)
Calcium: 8.1 mg/dL — ABNORMAL LOW (ref 8.9–10.3)
Calcium: 8.4 mg/dL — ABNORMAL LOW (ref 8.9–10.3)
Chloride: 103 mmol/L (ref 98–111)
Chloride: 103 mmol/L (ref 98–111)
Creatinine, Ser: 0.52 mg/dL (ref 0.44–1.00)
Creatinine, Ser: 0.63 mg/dL (ref 0.44–1.00)
GFR calc Af Amer: >60 mL/min (ref >60)
GFR calc Af Amer: >60 mL/min (ref >60)
GFR calc non Af Amer: >60 mL/min (ref >60)
GFR calc non Af Amer: >60 mL/min (ref >60)
Glucose, Bld: 160 mg/dL — ABNORMAL HIGH (ref 70–99)
Glucose, Bld: 233 mg/dL — ABNORMAL HIGH (ref 70–99)
Potassium: 3.8 mmol/L (ref 3.5–5.1)
Potassium: 3.9 mmol/L (ref 3.5–5.1)
Sodium: 134 mmol/L — ABNORMAL LOW (ref 135–145)
Sodium: 134 mmol/L — ABNORMAL LOW (ref 135–145)

## 2018-07-03 LAB — CBC WITH DIFFERENTIAL/PLATELET
Abs Immature Granulocytes: 0.92 10*3/uL — ABNORMAL HIGH (ref 0.00–0.07)
Basophils Absolute: 0 10*3/uL (ref 0.0–0.1)
Basophils Relative: 0 %
Eosinophils Absolute: 0 10*3/uL (ref 0.0–0.5)
Eosinophils Relative: 0 %
HCT: 27.8 % — ABNORMAL LOW (ref 36.0–46.0)
Hemoglobin: 8.3 g/dL — ABNORMAL LOW (ref 12.0–15.0)
Immature Granulocytes: 3 %
LYMPHS ABS: 3 10*3/uL (ref 0.7–4.0)
Lymphocytes Relative: 11 %
MCH: 24.9 pg — ABNORMAL LOW (ref 26.0–34.0)
MCHC: 29.9 g/dL — ABNORMAL LOW (ref 30.0–36.0)
MCV: 83.5 fL (ref 80.0–100.0)
Monocytes Absolute: 1.3 10*3/uL — ABNORMAL HIGH (ref 0.1–1.0)
Monocytes Relative: 5 %
Neutro Abs: 22.3 10*3/uL — ABNORMAL HIGH (ref 1.7–7.7)
Neutrophils Relative %: 81 %
Platelets: 319 10*3/uL (ref 150–400)
RBC: 3.33 MIL/uL — ABNORMAL LOW (ref 3.87–5.11)
RDW: 15.2 % (ref 11.5–15.5)
WBC: 27.6 10*3/uL — ABNORMAL HIGH (ref 4.0–10.5)
nRBC: 0.1 % (ref 0.0–0.2)

## 2018-07-03 LAB — MAGNESIUM: Magnesium: 1.7 mg/dL (ref 1.7–2.4)

## 2018-07-03 LAB — SURGICAL PCR SCREEN
MRSA, PCR: NEGATIVE
Staphylococcus aureus: NEGATIVE

## 2018-07-03 SURGERY — DEBRIDEMENT, WOUND
Anesthesia: General

## 2018-07-03 MED ORDER — SUCCINYLCHOLINE CHLORIDE 200 MG/10ML IV SOSY
PREFILLED_SYRINGE | INTRAVENOUS | Status: AC
  Filled 2018-07-03: qty 10

## 2018-07-03 MED ORDER — ACETAMINOPHEN 325 MG PO TABS: 325.0000 mg | ORAL_TABLET | ORAL | Status: DC | PRN

## 2018-07-03 MED ORDER — MEPERIDINE HCL 50 MG/ML IJ SOLN: 6.2500 mg | INTRAMUSCULAR | Status: DC | PRN

## 2018-07-03 MED ORDER — MUPIROCIN 2 % EX OINT: 1.0000 "application " | TOPICAL_OINTMENT | Freq: Two times a day (BID) | CUTANEOUS | Status: DC

## 2018-07-03 MED ORDER — LACTATED RINGERS IV SOLN
INTRAVENOUS | Status: DC | PRN
  Administered 2018-07-03: 11:00:00 via INTRAVENOUS

## 2018-07-03 MED ORDER — PHENYLEPHRINE 40 MCG/ML (10ML) SYRINGE FOR IV PUSH (FOR BLOOD PRESSURE SUPPORT)
PREFILLED_SYRINGE | INTRAVENOUS | Status: AC
  Filled 2018-07-03: qty 10

## 2018-07-03 MED ORDER — SUCCINYLCHOLINE CHLORIDE 200 MG/10ML IV SOSY
PREFILLED_SYRINGE | INTRAVENOUS | Status: DC | PRN
  Administered 2018-07-03: 140 mg via INTRAVENOUS

## 2018-07-03 MED ORDER — LIDOCAINE 2% (20 MG/ML) 5 ML SYRINGE
INTRAMUSCULAR | Status: AC
  Filled 2018-07-03: qty 5

## 2018-07-03 MED ORDER — OXYCODONE HCL 5 MG PO TABS: 5.0000 mg | ORAL_TABLET | Freq: Once | ORAL | Status: DC | PRN

## 2018-07-03 MED ORDER — FENTANYL CITRATE (PF) 100 MCG/2ML IJ SOLN
INTRAMUSCULAR | Status: AC
  Filled 2018-07-03: qty 2

## 2018-07-03 MED ORDER — ONDANSETRON HCL 4 MG/2ML IJ SOLN
INTRAMUSCULAR | Status: DC | PRN
  Administered 2018-07-03: 4 mg via INTRAVENOUS

## 2018-07-03 MED ORDER — CHLORHEXIDINE GLUCONATE CLOTH 2 % EX PADS
6.0000 | MEDICATED_PAD | Freq: Every day | CUTANEOUS | Status: DC
  Administered 2018-07-03 – 2018-07-08 (×5): 6 via TOPICAL

## 2018-07-03 MED ORDER — SODIUM CHLORIDE 0.9% FLUSH: 10.0000 mL | INTRAVENOUS | Status: DC | PRN

## 2018-07-03 MED ORDER — DEXAMETHASONE SODIUM PHOSPHATE 10 MG/ML IJ SOLN
INTRAMUSCULAR | Status: AC
  Filled 2018-07-03: qty 1

## 2018-07-03 MED ORDER — DEXAMETHASONE SODIUM PHOSPHATE 10 MG/ML IJ SOLN
INTRAMUSCULAR | Status: DC | PRN
  Administered 2018-07-03: 5 mg via INTRAVENOUS

## 2018-07-03 MED ORDER — LIDOCAINE 2% (20 MG/ML) 5 ML SYRINGE
INTRAMUSCULAR | Status: DC | PRN
  Administered 2018-07-03: 60 mg via INTRAVENOUS

## 2018-07-03 MED ORDER — ONDANSETRON HCL 4 MG/2ML IJ SOLN
INTRAMUSCULAR | Status: AC
  Filled 2018-07-03: qty 2

## 2018-07-03 MED ORDER — ONDANSETRON HCL 4 MG/2ML IJ SOLN: 4.0000 mg | Freq: Once | INTRAMUSCULAR | Status: DC | PRN

## 2018-07-03 MED ORDER — OXYCODONE HCL 5 MG/5ML PO SOLN: 5.0000 mg | Freq: Once | ORAL | Status: DC | PRN

## 2018-07-03 MED ORDER — SODIUM CHLORIDE 0.9% FLUSH
10.0000 mL | Freq: Two times a day (BID) | INTRAVENOUS | Status: DC
  Administered 2018-07-04 – 2018-07-08 (×9): 10 mL

## 2018-07-03 MED ORDER — PROPOFOL 10 MG/ML IV BOLUS
INTRAVENOUS | Status: DC | PRN
  Administered 2018-07-03: 100 mg via INTRAVENOUS

## 2018-07-03 MED ORDER — EPHEDRINE SULFATE-NACL 50-0.9 MG/10ML-% IV SOSY
PREFILLED_SYRINGE | INTRAVENOUS | Status: DC | PRN
  Administered 2018-07-03: 5 mg via INTRAVENOUS

## 2018-07-03 MED ORDER — FENTANYL CITRATE (PF) 100 MCG/2ML IJ SOLN
INTRAMUSCULAR | Status: DC | PRN
  Administered 2018-07-03 (×2): 50 ug via INTRAVENOUS

## 2018-07-03 MED ORDER — EPHEDRINE 5 MG/ML INJ
INTRAVENOUS | Status: AC
  Filled 2018-07-03: qty 10

## 2018-07-03 MED ORDER — MAGNESIUM SULFATE 2 GM/50ML IV SOLN
2.0000 g | Freq: Once | INTRAVENOUS | Status: AC
  Administered 2018-07-03: 2 g via INTRAVENOUS
  Filled 2018-07-03: qty 50

## 2018-07-03 MED ORDER — LIP MEDEX EX OINT
TOPICAL_OINTMENT | CUTANEOUS | Status: AC
  Administered 2018-07-03: 13:00:00
  Filled 2018-07-03: qty 7

## 2018-07-03 MED ORDER — PHENYLEPHRINE 40 MCG/ML (10ML) SYRINGE FOR IV PUSH (FOR BLOOD PRESSURE SUPPORT)
PREFILLED_SYRINGE | INTRAVENOUS | Status: DC | PRN
  Administered 2018-07-03 (×2): 80 ug via INTRAVENOUS

## 2018-07-03 MED ORDER — ACETAMINOPHEN 160 MG/5ML PO SOLN: 325.0000 mg | ORAL | Status: DC | PRN

## 2018-07-03 MED ORDER — FENTANYL CITRATE (PF) 100 MCG/2ML IJ SOLN: 25.0000 ug | INTRAMUSCULAR | Status: DC | PRN

## 2018-07-03 SURGICAL SUPPLY — 39 items
BLADE HEX COATED 2.75 (ELECTRODE) ×3 IMPLANT
BLADE SURG SZ10 CARB STEEL (BLADE) ×3 IMPLANT
BNDG GAUZE ELAST 4 BULKY (GAUZE/BANDAGES/DRESSINGS) ×6 IMPLANT
COVER SURGICAL LIGHT HANDLE (MISCELLANEOUS) ×3 IMPLANT
COVER WAND RF STERILE (DRAPES) IMPLANT
DECANTER SPIKE VIAL GLASS SM (MISCELLANEOUS) IMPLANT
DERMABOND ADVANCED (GAUZE/BANDAGES/DRESSINGS)
DERMABOND ADVANCED .7 DNX12 (GAUZE/BANDAGES/DRESSINGS) IMPLANT
DRAPE LAPAROSCOPIC ABDOMINAL (DRAPES) IMPLANT
DRAPE LAPAROTOMY T 102X78X121 (DRAPES) IMPLANT
DRAPE LAPAROTOMY TRNSV 102X78 (DRAPE) IMPLANT
DRAPE SHEET LG 3/4 BI-LAMINATE (DRAPES) ×3 IMPLANT
ELECT PENCIL ROCKER SW 15FT (MISCELLANEOUS) ×3 IMPLANT
ELECT REM PT RETURN 15FT ADLT (MISCELLANEOUS) ×3 IMPLANT
GAUZE SPONGE 4X4 12PLY STRL (GAUZE/BANDAGES/DRESSINGS) ×3 IMPLANT
GLOVE BIO SURGEON STRL SZ7 (GLOVE) ×9 IMPLANT
GLOVE BIOGEL PI IND STRL 7.0 (GLOVE) ×1 IMPLANT
GLOVE BIOGEL PI IND STRL 7.5 (GLOVE) ×3 IMPLANT
GLOVE BIOGEL PI INDICATOR 7.0 (GLOVE) ×2
GLOVE BIOGEL PI INDICATOR 7.5 (GLOVE) ×6
GOWN STRL REUS W/ TWL XL LVL3 (GOWN DISPOSABLE) ×1 IMPLANT
GOWN STRL REUS W/TWL LRG LVL3 (GOWN DISPOSABLE) ×6 IMPLANT
GOWN STRL REUS W/TWL XL LVL3 (GOWN DISPOSABLE) ×5 IMPLANT
KIT BASIN OR (CUSTOM PROCEDURE TRAY) ×3 IMPLANT
KIT FLEXISEAL FECAL MGMNT (GAUZE/BANDAGES/DRESSINGS) ×3 IMPLANT
MARKER SKIN DUAL TIP RULER LAB (MISCELLANEOUS) ×3 IMPLANT
NEEDLE HYPO 25X1 1.5 SAFETY (NEEDLE) IMPLANT
NS IRRIG 1000ML POUR BTL (IV SOLUTION) ×3 IMPLANT
PACK BASIC VI WITH GOWN DISP (CUSTOM PROCEDURE TRAY) ×3 IMPLANT
SPONGE LAP 18X18 RF (DISPOSABLE) ×6 IMPLANT
SPONGE LAP 4X18 RFD (DISPOSABLE) IMPLANT
STAPLER VISISTAT 35W (STAPLE) IMPLANT
SUT MNCRL AB 4-0 PS2 18 (SUTURE) IMPLANT
SUT VIC AB 3-0 SH 18 (SUTURE) IMPLANT
SYR BULB IRRIGATION 50ML (SYRINGE) ×3 IMPLANT
SYR CONTROL 10ML LL (SYRINGE) IMPLANT
TOWEL OR 17X26 10 PK STRL BLUE (TOWEL DISPOSABLE) ×3 IMPLANT
TOWEL OR NON WOVEN STRL DISP B (DISPOSABLE) ×3 IMPLANT
YANKAUER SUCT BULB TIP 10FT TU (MISCELLANEOUS) ×3 IMPLANT

## 2018-07-03 NOTE — Anesthesia Preprocedure Evaluation (Addendum)
Anesthesia Evaluation  Patient identified by MRN, date of birth, ID band Patient awake    Reviewed: Allergy & Precautions, NPO status , Patient's Chart, lab work & pertinent test results  History of Anesthesia Complications Negative for: history of anesthetic complications  Airway Mallampati: II  TM Distance: >3 FB Neck ROM: Full    Dental no notable dental hx. (+) Poor Dentition, Missing   Pulmonary asthma , sleep apnea , COPD, Current Smoker,    Pulmonary exam normal        Cardiovascular hypertension, + CAD and + Past MI  Normal cardiovascular exam     Neuro/Psych negative neurological ROS  negative psych ROS   GI/Hepatic negative GI ROS, Neg liver ROS,   Endo/Other  diabetes, Poorly Controlled  Renal/GU negative Renal ROS  negative genitourinary   Musculoskeletal  (+) Arthritis , Osteoarthritis,    Abdominal   Peds  Hematology  (+) Blood dyscrasia, anemia ,   Anesthesia Other Findings PMHx Anemia,Arthritis, Asthma, COPD, OSA on CPAP, CAD, diabetes uncontrolled, HLD,, MI, Restless leg syndrome, noncompliance.   Reproductive/Obstetrics                            Anesthesia Physical  Anesthesia Plan  ASA: III  Anesthesia Plan: General   Post-op Pain Management:    Induction: Intravenous  PONV Risk Score and Plan: 2 and Ondansetron, Dexamethasone and Treatment may vary due to age or medical condition  Airway Management Planned: Oral ETT and LMA  Additional Equipment: None  Intra-op Plan:   Post-operative Plan: Extubation in OR  Informed Consent: I have reviewed the patients History and Physical, chart, labs and discussed the procedure including the risks, benefits and alternatives for the proposed anesthesia with the patient or authorized representative who has indicated his/her understanding and acceptance.     Dental advisory given  Plan Discussed with: CRNA,  Anesthesiologist and Surgeon  Anesthesia Plan Comments:         Anesthesia Quick Evaluation

## 2018-07-03 NOTE — Progress Notes (Signed)
INFECTIOUS DISEASE PROGRESS NOTE  ID: Courtney [name] is a 73 y.o. 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 Active Problems:   HTN (hypertension)   Normocytic anemia   HLD (hyperlipidemia)   Restless leg syndrome   COPD (chronic obstructive pulmonary disease) (HCC)   CAD (coronary artery disease)   OSA and COPD overlap syndrome (HCC)   Pressure injury of skin   DM (diabetes mellitus) (HCC)   Cigarette smoker   Sepsis (HCC)  Subjective: Awake, alert. Feels better  Abtx:  Anti-infectives (From admission, onward)   Start     Dose/Rate Route Frequency Ordered Stop   06/28/18 1400  metroNIDAZOLE (FLAGYL) tablet 500 mg     500 mg Oral Every 8 hours 06/28/18 1225     06/26/18 2200  vancomycin (VANCOCIN) 1,500 mg in sodium chloride 0.9 % 500 mL IVPB  Status:  Discontinued     1,500 mg 250 mL/hr over 120 Minutes Intravenous Every 24 hours 06/26/18 0446 06/28/18 1225   06/26/18 0600  clindamycin (CLEOCIN) IVPB 600 mg  Status:  Discontinued     600 mg 100 mL/hr over 30 Minutes Intravenous Every 8 hours 06/26/18 0508 06/28/18 1225   06/26/18 0415  clindamycin (CLEOCIN) IVPB 300 mg  Status:  Discontinued     300 mg 100 mL/hr over 30 Minutes Intravenous Every 8 hours 06/26/18 0356 06/26/18 0508   06/26/18 0400  cefTRIAXone (ROCEPHIN) 2 g in sodium chloride 0.9 % 100 mL IVPB  Status:  Discontinued     2 g 200 mL/hr over 30 Minutes Intravenous Every 24 hours 06/26/18 0356 06/26/18 0409   06/25/18 2330  vancomycin (VANCOCIN) IVPB 1000 mg/200 mL premix     1,000 mg 200 mL/hr over 60 Minutes Intravenous  Once 06/25/18 1958 06/26/18 0015   06/25/18 1500  clindamycin (CLEOCIN) IVPB 600 mg     600 mg 100 mL/hr over 30 Minutes Intravenous  Once 06/25/18 1453 06/25/18 1657   06/25/18 1330  vancomycin (VANCOCIN) IVPB 1000 mg/200 mL premix     1,000 mg 200 mL/hr over 60 Minutes Intravenous  Once 06/25/18 1318 06/25/18 1606   06/25/18 1330  cefTRIAXone (ROCEPHIN) 2 g in  sodium chloride 0.9 % 100 mL IVPB     2 g 200 mL/hr over 30 Minutes Intravenous Every 24 hours 06/25/18 1318        Medications:  Scheduled: . chlorhexidine  15 mL Mouth Rinse BID  . Chlorhexidine Gluconate Cloth  6 each Topical Daily  . enoxaparin (LOVENOX) injection  40 mg Subcutaneous Q24H  . insulin aspart  0-5 Units Subcutaneous QHS  . insulin aspart  0-9 Units Subcutaneous TID WC  . insulin glargine  25 Units Subcutaneous Daily  . lisinopril  10 mg Oral Daily  . mouth rinse  15 mL Mouth Rinse q12n4p  . metoprolol succinate  50 mg Oral Daily  . metroNIDAZOLE  500 mg Oral Q8H  . multivitamin with minerals  1 tablet Oral Daily  . mupirocin ointment  1 application Nasal BID  . nutrition supplement (JUVEN)  1 packet Oral BID BM  . ENSURE MAX PROTEIN  11 oz Oral BID  . rosuvastatin  20 mg Oral Daily  . sodium chloride flush  10-40 mL Intracatheter Q12H    Objective: Vital signs in last 24 hours: Temp:  [98 F (36.7 C)-100.3 F (37.9 C)] 98 F (36.7 C) (03/04 1254) Pulse Rate:  [42-92] 91 (03/04 1400) Resp:  [16-26] 23 (03/04  1400) BP: (124-181)/(42-112) 141/112 (03/04 1400) SpO2:  [95 %-100 %] 97 % (03/04 1400) Weight:  [102.7 kg] 102.7 kg (03/04 1107)   General appearance: alert and no distress Neck: R IJ Resp: clear to auscultation bilaterally Cardio: regular rate and rhythm GI: normal findings: soft, non-tender and abnormal findings:  hypoactive bowel sounds Extremities: edema anasarca  Lab Results Recent Labs    07/02/18 0500 07/03/18 0019 07/03/18 0638  WBC 30.0*  --  27.6*  HGB 8.6*  --  8.3*  HCT 29.4*  --  27.8*  NA  --  134* 134*  K  --  3.9 3.8  CL  --  103 103  CO2  --  27 27  BUN  --  14 13  CREATININE  --  0.63 0.52   Liver Panel No results for input(s): PROT, ALBUMIN, AST, ALT, ALKPHOS, BILITOT, BILIDIR, IBILI in the last 72 hours. Sedimentation Rate No results for input(s): ESRSEDRATE in the last 72 hours. C-Reactive Protein No results  for input(s): CRP in the last 72 hours.  Microbiology: Recent Results (from the past 240 hour(s))  Culture, blood (routine x 2)     Status: None   Collection Time: 06/25/18 12:10 PM  Result Value Ref Range Status   Specimen Description   Final    BLOOD LEFT ANTECUBITAL Performed at [facility], [address], [place], [place] [address]    Special Requests   Final    BOTTLES DRAWN AEROBIC AND ANAEROBIC Blood Culture adequate volume Performed at [facility], [address], [place], [place] [address]    Culture   Final    NO GROWTH 5 DAYS Performed at [facility], [address], [place], [place] [address]    Report Status 06/30/2018 FINAL  Final  Culture, blood (routine x 2)     Status: None   Collection Time: 06/25/18  2:06 PM  Result Value Ref Range Status   Specimen Description   Final    BLOOD LEFT ANTECUBITAL Performed at [facility], [address] [place], [place] [address]    Special Requests   Final    BOTTLES DRAWN AEROBIC AND ANAEROBIC Blood Culture adequate volume Performed at [facility], [address], [place], [place] [address]    Culture   Final    NO GROWTH 5 DAYS Performed at [facility], [address], [place], [place] [address]    Report Status 06/30/2018 FINAL  Final  Aerobic/Anaerobic Culture (surgical/deep wound)     Status: None   Collection Time: 06/25/18  5:18 PM  Result Value Ref Range Status   Specimen Description   Final    ABSCESS Performed at [facility], [address] [place], [place] [address]    Special Requests RECTAL  Final   Gram Stain   Final    FEW WBC PRESENT, PREDOMINANTLY PMN ABUNDANT GRAM NEGATIVE RODS ABUNDANT GRAM POSITIVE COCCI Performed at [facility], [address] [place], [place] [address]    Culture   Final    RARE ESCHERICHIA COLI MIXED ANAEROBIC FLORA PRESENT.  CALL LAB IF FURTHER IID  REQUIRED.    Report Status 06/29/2018 FINAL  Final   Organism ID, Bacteria ESCHERICHIA COLI  Final      Susceptibility   Escherichia coli - MIC*    AMPICILLIN 16 INTERMEDIATE Intermediate     CEFAZOLIN <=4 SENSITIVE Sensitive     CEFEPIME <=  1 SENSITIVE Sensitive     CEFTAZIDIME <=1 SENSITIVE Sensitive     CEFTRIAXONE <=1 SENSITIVE Sensitive     CIPROFLOXACIN >=4 RESISTANT Resistant     GENTAMICIN <=1 SENSITIVE Sensitive     IMIPENEM <=0.25 SENSITIVE Sensitive     TRIMETH/SULFA <=20 SENSITIVE Sensitive     AMPICILLIN/SULBACTAM 4 SENSITIVE Sensitive     PIP/TAZO <=4 SENSITIVE Sensitive     Extended ESBL NEGATIVE Sensitive     * RARE ESCHERICHIA COLI  Culture, blood (x 2)     Status: None   Collection Time: 06/25/18  7:52 PM  Result Value Ref Range Status   Specimen Description   Final    BLOOD ARTERIAL LINE Performed at [facility], [address] [place], [place] [address]    Special Requests   Final    BOTTLES DRAWN AEROBIC AND ANAEROBIC Blood Culture adequate volume Performed at [facility], [address] [place], [place] [address]    Culture   Final    NO GROWTH 5 DAYS Performed at [facility], [address] [place], [place] [address]    Report Status 06/30/2018 FINAL  Final  Culture, blood (x 2)     Status: None   Collection Time: 06/25/18  9:34 PM  Result Value Ref Range Status   Specimen Description BLOOD LEFT ARM  Final   Special Requests   Final    BOTTLES DRAWN AEROBIC ONLY Blood Culture results may not be optimal due to an inadequate volume of blood received in culture bottles   Culture   Final    NO GROWTH 5 DAYS Performed at [facility], [address], [place], [place] [address]    Report Status 06/30/2018 FINAL  Final  MRSA PCR Screening     Status: None   Collection Time: 06/25/18 10:19 PM  Result Value Ref Range Status   MRSA by PCR NEGATIVE NEGATIVE Final    Comment:        The GeneXpert MRSA  Assay (FDA approved for NASAL specimens only), is one component of a comprehensive MRSA colonization surveillance program. It is not intended to diagnose MRSA infection nor to guide or monitor treatment for MRSA infections. Performed at [facility], [address], [place], [place] [address]   Urine culture     Status: None   Collection Time: 06/26/18  1:35 PM  Result Value Ref Range Status   Specimen Description   Final    URINE, RANDOM Performed at [facility], [address], [place], [place] [address]    Special Requests   Final    Normal Performed at [facility], [address], [place], [place] [address]    Culture   Final    NO GROWTH Performed at [facility], [address], [place], [place] [address]    Report Status 06/27/2018 FINAL  Final  Surgical PCR screen     Status: None   Collection Time: 07/03/18 10:13 AM  Result Value Ref Range Status   MRSA, PCR NEGATIVE NEGATIVE Final   Staphylococcus aureus NEGATIVE NEGATIVE Final    Comment: (NOTE) The Xpert SA Assay (FDA approved for NASAL specimens in patients 24 years of age and older), is one component of a comprehensive surveillance program. It is not intended to diagnose infection nor to guide or monitor treatment. Performed at [facility], [address] [place], [place] [address]  Studies/Results: Vas [place] Upper Extremity Venous Duplex  Result Date: 07/02/2018 UPPER VENOUS STUDY  Indications: rule out DVT Limitations: Central line on site; immobility of patient's right arm. Comparison Study: No comparison study available. Performing Technologist: Melodie Bouillon  Examination Guidelines: A complete evaluation includes B-mode imaging, spectral Doppler, color Doppler, and power Doppler as needed of all accessible portions of each vessel. Bilateral testing is considered an integral part of a complete examination. Limited  examinations for reoccurring indications may be performed as noted.  Right Findings: +----------+------------+----------+---------+-----------+---------------------+ RIGHT     CompressiblePropertiesPhasicitySpontaneous       Summary        +----------+------------+----------+---------+-----------+---------------------+ IJV                                Yes       Yes     Not well visualized                                                      with compression due                                                       to central line on                                                               site          +----------+------------+----------+---------+-----------+---------------------+ Subclavian    Full                 Yes       Yes                          +----------+------------+----------+---------+-----------+---------------------+ Axillary      Full                 Yes       Yes                          +----------+------------+----------+---------+-----------+---------------------+ Brachial      Full                 Yes       Yes                          +----------+------------+----------+---------+-----------+---------------------+ Radial                                                 Not visualized     +----------+------------+----------+---------+-----------+---------------------+ Ulnar  Not visualized     +----------+------------+----------+---------+-----------+---------------------+ Cephalic    Partial                                         Acute         +----------+------------+----------+---------+-----------+---------------------+ Basilic       Full                 Yes       Yes                          +----------+------------+----------+---------+-----------+---------------------+ Cephalic: distal to forearm unable to compress.  Left Findings:  +----------+------------+----------+---------+-----------+-------+ LEFT      CompressiblePropertiesPhasicitySpontaneousSummary +----------+------------+----------+---------+-----------+-------+ Subclavian    Full                 Yes       Yes            +----------+------------+----------+---------+-----------+-------+  Summary:  Right: No evidence of thrombosis in the subclavian. Findings consistent with acute superficial vein thrombosis involving the right cephalic vein distal segment . This was a limited study due to patient cannot move her arm with tightedness position, distal segment of brachial veins unable to exam .  Left: No evidence of thrombosis in the subclavian.  *See table(s) above for measurements and observations.  Diagnosing physician: Waverly Ferrari MD Electronically signed by Waverly Ferrari MD on 07/02/2018 at 3:55:16 PM.    Final    [place] Ekg Site Rite  Result Date: 07/03/2018 If Site Rite image not attached, placement could not be confirmed due to current cardiac rhythm.    Assessment/Plan: Fournier's Gangrene Debrided 3-3, 2-27 Cx E coli, mixed anaerobes DM2, poorly controlled A1C 12.1% HTN Superficial RUE DVT   Total days of antibiotics: 9 (ceftriaxone/flagyl)  Back to OR today with further necrotic tissue removed.  Will change anbx, add zyvox if her dressing changes continue to show necrosis.  Watch WBC, slightly better today afebrile Despite her WBC she clinically continues to appear/feel well.         Johny Sax MD, FACP Infectious Diseases (pager) [phone] [email or web address] 07/03/2018, 3:13 PM  LOS: 8 days

## 2018-07-03 NOTE — Op Note (Signed)
PRE-OPERATIVE DIAGNOSIS:Necrotizing soft tissue infection  POST-OPERATIVE DIAGNOSIS: Necrotozing soft tissue infection  PROCEDURE: excisional debridement, dressing change  Surgeon: Dr Harden Mo  ASSISTANT:Molly Peri Maris, [place] student  ANESTHESIA:general  EBL:25 cc  SPECIMEN:No Specimen  COUNTS:correct at end of completion  PLAN OF CARE:Pt admitted  PATIENT DISPOSITION:PACU - hemodynamically stable.  INDICATION:72 yof with uncontrolled diabetes and severe perineal infection. She has undergone debridement twice now and has elevated wbc and continued purulence from the upper portion of the wound.   Frequency of debridement:three Area of body debrided:perineum, right side, mons pubis, right groin Presence and extent of infected tissue:moderate Presence and extent of non viable tissue:moderate  OR FINDINGS:mild amount necrotic tissue in right groin which I opened more as well as to other side of vagine, The entire wound is now 25x20x5 cm in size.    DESCRIPTION:The patient was identified in the pre op holding area and taken to the OR, where they were laid supineon the OR table. Generalanesthesia was induced. The operative area was prepped and draped in the usual sterile fashion. A surgical time out was performed, indicating the correct patient, procedure, positioning and pre-operative antibiotics.   After this was completed, the wound was measured to be25x20x5 cm in size. The total area of devitalized tissue was 5x5 cm. Scissors and bovie wereused to debride the wound to vital tissues. Debridement was carried down to the level of muscle and bone. Skin andfat wereremoved. There was necroticmaterial in the wound that would inhibit healing and or promote adjacent tissue breakdown. At the end of the procedure the debrided area measured 25x20x5 cm  The wound was then packed with Kerlex roll gauzeand covered with a sterile dressing.  I  also inserted a flexiseal fecal management system to control her diarrhea which was copious. The patient was awakened from anesthesia and sent to the PACU in stable condition. All counts were correct per OR staff.

## 2018-07-03 NOTE — Progress Notes (Signed)
Nutrition Follow-up  DOCUMENTATION CODES:   Obesity unspecified  INTERVENTION:  - Diet advancement as medically feasible. - Continue Ensure Max BID and Juven BID. - Continue to encourage PO intakes.    NUTRITION DIAGNOSIS:   Increased nutrient needs related to wound healing, acute illness, post-op healing as evidenced by estimated needs. -ongoing  GOAL:   Patient will meet greater than or equal to 90% of their needs -likely unmet for kcal but met for protein.  MONITOR:   PO intake, Supplement acceptance, Weight trends, Labs, Skin  ASSESSMENT:   73 y.o. female with medical history of anemia, arthritis, asthma, COPD, OSA on CPAP, CAD, uncontrolled DM, HLD, MI, RLS, and medical noncompliance. She presented to the ED for evaluation of frequent falls and a boil on her gluteal area. She denied any injuries from falls but has been unable to get up after falling.  She reported generalized weakness. She also reported a boil on her backside for a few days that is has progressively worsened and was severe the night PTA.  Per chart review, weight +7.2 kg/16 lb from 2/27-2/29. No new weight recorded since 2/29. Patient has been NPO but was on Carb Modified diet yesterday. She recalls consuming a bagel, egg with cheese, and chicken yesterday and is unable to recall what else she consumed yesterday. She states being hungry at this time and wanting something to eat and drink; reminded patient of NPO status. Last documented intake was 50% of lunch and dinner on 2/28. Per review of orders, it appears that patient has been accepting nearly all of Ensure Max and Juven.  Per Brooke's note this AM, plan to return to OR today for additional debridement and dressing change.      Medications reviewed; sliding scale novolog, 25 units lantus/day, 2 g IV Mg sulfate x1 run 3/4, daily multivitamin with minerals. Labs reviewed; CBG: 140 mg/dl today, Na: 134 mmol/l, Ca: 8.4 mg/dl.   Diet Order:   Diet Order             Diet NPO time specified  Diet effective midnight              EDUCATION NEEDS:   Education needs have been addressed  Skin:  Skin Assessment: Skin Integrity Issues: Skin Integrity Issues:: Incisions Stage II: sacrum x2, R groin Incisions: perineum  Last BM:  3/3  Height:   Ht Readings from Last 1 Encounters:  06/27/18 '5\' 3"'$  (1.6 m)    Weight:   Wt Readings from Last 1 Encounters:  06/29/18 102.7 kg    Ideal Body Weight:  52.27 kg  BMI:  Body mass index is 40.11 kg/m.  Estimated Nutritional Needs:   Kcal:  1855-2045 kcal  Protein:  80-90 grams  Fluid:  >/= 1.8 L/day     Jarome Matin, MS, RD, LDN, [facility] Inpatient Clinical Dietitian Pager # [phone] After hours/weekend pager # [phone]

## 2018-07-03 NOTE — Progress Notes (Signed)
[facility] Progress Note  2 Days Post-Op  Subjective: CC-  Comfortable this morning. Some episodes of nonsustained SVT over night. Patient denies CP or SOB. EKG ok.  HR currently in the 80's.  Objective: Vital signs in last 24 hours: Temp:  [98.4 F (36.9 C)-100.3 F (37.9 C)] 99 F (37.2 C) (03/04 0600) Pulse Rate:  [84-99] 92 (03/03 2100) Resp:  [13-26] 17 (03/04 0500) BP: (124-157)/(42-99) 131/61 (03/04 0600) SpO2:  [97 %-100 %] 99 % (03/03 2100) Last BM Date: 07/02/18  Intake/Output from previous day: 03/03 0701 - 03/04 0700 In: 589 [IV Piggyback:144] Out: -  Intake/Output this shift: No intake/output data recorded.  PE: Gen: Alert, NAD, pleasant HEENT: EOM's intact, pupils equal and round Cardio: RRR Pulm:effort normal, few rhonchi bilaterally, no wheezing Abd: Soft, NT/ND, +BS ZO:XWRUEAVW in place  Lab Results:  Recent Labs    07/02/18 0500 07/03/18 0638  WBC 30.0* 27.6*  HGB 8.6* 8.3*  HCT 29.4* 27.8*  PLT 285 319   BMET Recent Labs    07/03/18 0019 07/03/18 0638  NA 134* 134*  K 3.9 3.8  CL 103 103  CO2 27 27  GLUCOSE 233* 160*  BUN 14 13  CREATININE 0.63 0.52  CALCIUM 8.1* 8.4*   PT/INR No results for input(s): LABPROT, INR in the last 72 hours. CMP     Component Value Date/Time   NA 134 (L) 07/03/2018 0638   NA 142 10/02/2016 0856   NA 142 08/06/2014 0854   K 3.8 07/03/2018 0638   K 4.8 08/06/2014 0854   CL 103 07/03/2018 0638   CL 103 01/26/2012 1242   CO2 27 07/03/2018 0638   CO2 29 08/06/2014 0854   GLUCOSE 160 (H) 07/03/2018 0638   GLUCOSE 372 (H) 08/06/2014 0854   GLUCOSE 122 (H) 01/26/2012 1242   BUN 13 07/03/2018 0638   BUN 11 10/02/2016 0856   BUN 12.1 08/06/2014 0854   CREATININE 0.52 07/03/2018 0638   CREATININE 1.1 08/06/2014 0854   CALCIUM 8.4 (L) 07/03/2018 0638   CALCIUM 10.6 (H) 08/06/2014 0854   PROT 5.3 (L) 06/25/2018 1947   PROT 6.7 10/02/2016 0856   PROT 6.9 08/06/2014 0854   ALBUMIN  1.9 (L) 06/25/2018 1947   ALBUMIN 4.1 10/02/2016 0856   ALBUMIN 3.6 08/06/2014 0854   AST 44 (H) 06/25/2018 1947   AST 12 08/06/2014 0854   ALT 50 (H) 06/25/2018 1947   ALT 15 08/06/2014 0854   ALKPHOS 147 (H) 06/25/2018 1947   ALKPHOS 140 08/06/2014 0854   BILITOT 0.4 06/25/2018 1947   BILITOT 0.2 10/02/2016 0856   BILITOT 0.29 08/06/2014 0854   GFRNONAA >60 07/03/2018 0638   GFRAA >60 07/03/2018 0638   Lipase  No results found for: LIPASE     Studies/Results: Vas [place] Upper Extremity Venous Duplex  Result Date: 07/02/2018 UPPER VENOUS STUDY  Indications: rule out DVT Limitations: Central line on site; immobility of patient's right arm. Comparison Study: No comparison study available. Performing Technologist: Melodie Bouillon  Examination Guidelines: A complete evaluation includes B-mode imaging, spectral Doppler, color Doppler, and power Doppler as needed of all accessible portions of each vessel. Bilateral testing is considered an integral part of a complete examination. Limited examinations for reoccurring indications may be performed as noted.  Right Findings: +----------+------------+----------+---------+-----------+---------------------+ RIGHT     CompressiblePropertiesPhasicitySpontaneous       Summary        +----------+------------+----------+---------+-----------+---------------------+ IJV  Yes       Yes     Not well visualized                                                      with compression due                                                       to central line on                                                               site          +----------+------------+----------+---------+-----------+---------------------+ Subclavian    Full                 Yes       Yes                          +----------+------------+----------+---------+-----------+---------------------+ Axillary      Full                  Yes       Yes                          +----------+------------+----------+---------+-----------+---------------------+ Brachial      Full                 Yes       Yes                          +----------+------------+----------+---------+-----------+---------------------+ Radial                                                 Not visualized     +----------+------------+----------+---------+-----------+---------------------+ Ulnar                                                  Not visualized     +----------+------------+----------+---------+-----------+---------------------+ Cephalic    Partial                                         Acute         +----------+------------+----------+---------+-----------+---------------------+ Basilic       Full                 Yes       Yes                          +----------+------------+----------+---------+-----------+---------------------+ Cephalic: distal  to forearm unable to compress.  Left Findings: +----------+------------+----------+---------+-----------+-------+ LEFT      CompressiblePropertiesPhasicitySpontaneousSummary +----------+------------+----------+---------+-----------+-------+ Subclavian    Full                 Yes       Yes            +----------+------------+----------+---------+-----------+-------+  Summary:  Right: No evidence of thrombosis in the subclavian. Findings consistent with acute superficial vein thrombosis involving the right cephalic vein distal segment . This was a limited study due to patient cannot move her arm with tightedness position, distal segment of brachial veins unable to exam .  Left: No evidence of thrombosis in the subclavian.  *See table(s) above for measurements and observations.  Diagnosing physician: Waverly Ferrari MD Electronically signed by Waverly Ferrari MD on 07/02/2018 at 3:55:16 PM.    Final     Anti-infectives: Anti-infectives (From admission, onward)    Start     Dose/Rate Route Frequency Ordered Stop   06/28/18 1400  metroNIDAZOLE (FLAGYL) tablet 500 mg     500 mg Oral Every 8 hours 06/28/18 1225     06/26/18 2200  vancomycin (VANCOCIN) 1,500 mg in sodium chloride 0.9 % 500 mL IVPB  Status:  Discontinued     1,500 mg 250 mL/hr over 120 Minutes Intravenous Every 24 hours 06/26/18 0446 06/28/18 1225   06/26/18 0600  clindamycin (CLEOCIN) IVPB 600 mg  Status:  Discontinued     600 mg 100 mL/hr over 30 Minutes Intravenous Every 8 hours 06/26/18 0508 06/28/18 1225   06/26/18 0415  clindamycin (CLEOCIN) IVPB 300 mg  Status:  Discontinued     300 mg 100 mL/hr over 30 Minutes Intravenous Every 8 hours 06/26/18 0356 06/26/18 0508   06/26/18 0400  cefTRIAXone (ROCEPHIN) 2 g in sodium chloride 0.9 % 100 mL IVPB  Status:  Discontinued     2 g 200 mL/hr over 30 Minutes Intravenous Every 24 hours 06/26/18 0356 06/26/18 0409   06/25/18 2330  vancomycin (VANCOCIN) IVPB 1000 mg/200 mL premix     1,000 mg 200 mL/hr over 60 Minutes Intravenous  Once 06/25/18 1958 06/26/18 0015   06/25/18 1500  clindamycin (CLEOCIN) IVPB 600 mg     600 mg 100 mL/hr over 30 Minutes Intravenous  Once 06/25/18 1453 06/25/18 1657   06/25/18 1330  vancomycin (VANCOCIN) IVPB 1000 mg/200 mL premix     1,000 mg 200 mL/hr over 60 Minutes Intravenous  Once 06/25/18 1318 06/25/18 1606   06/25/18 1330  cefTRIAXone (ROCEPHIN) 2 g in sodium chloride 0.9 % 100 mL IVPB     2 g 200 mL/hr over 30 Minutes Intravenous Every 24 hours 06/25/18 1318         Assessment/Plan Uncontrolled DM- A1c 12.1 HTN HLD CAD H/o STEMI 2011 s/p RCA stent COPD Acute on chronic anemia  Urinary retention   Sepsis Necrotizing soft tissue infection POD 6/4 S/p incision and drainage perirectal abscess 2/25 Dr. Maisie Fus S/p EXCISIONAL DEBRIDEMENT 2/27 Dr. Maisie Fus S/p EXCISIONAL DEBRIDEMENT, dressing change 3/2 Dr. Dwain Sarna - intraop cultures:RARE ESCHERICHIA COLI, resistant to cipro only -  appreciate ID assistance with abx - BID wet to dry dressing change  ID -rocephin/flagyl 2/25>> , clindamycin 2/25>>2/28, vancomycin 2/25>>2/28 VTE -SCDs, lovenox FEN -IVF, NPO for procedure Foley - in place for retention Follow up -TBD  Plan: To OR today for further debridement and dressing change. Repeat labs in AM.   LOS: 8 days    Franne Forts , [employer]  [facility] 07/03/2018, 7:52 AM Pager: [phone] Mon-Thurs 7:00 am-4:30 pm Fri 7:00 am -11:30 AM Sat-Sun 7:00 am-11:30 am

## 2018-07-03 NOTE — Anesthesia Procedure Notes (Signed)
Procedure Name: Intubation Date/Time: 07/03/2018 11:26 AM Performed by: Vanessa [place], CRNA Pre-anesthesia Checklist: Patient identified, Emergency Drugs available, Suction available and Patient being monitored Patient Re-evaluated:Patient Re-evaluated prior to induction Oxygen Delivery Method: Circle system utilized Preoxygenation: Pre-oxygenation with 100% oxygen Induction Type: IV induction Ventilation: Mask ventilation without difficulty Laryngoscope Size: 2 and Miller Grade View: Grade I Tube type: Oral Tube size: 7.0 mm Number of attempts: 1 Airway Equipment and Method: Stylet Placement Confirmation: ETT inserted through vocal cords under direct vision,  positive ETCO2 and breath sounds checked- equal and bilateral Secured at: 21 cm Tube secured with: Tape Dental Injury: Teeth and Oropharynx as per pre-operative assessment

## 2018-07-03 NOTE — Progress Notes (Signed)
PROGRESS NOTE    Courtney [name]  VWU:[id number] DOB: [birth year] DOA: 06/25/2018 PCP: Jordan Hawks, PA-C   Brief Narrative: Patient is a 73 year old female with history of uncontrolled type diabetes mellitus, coronary artery disease, OSA on CPAP, asthma, COPD who presents to the emergency department with complaints of generalized weakness, gluteal abscess for several days.  She was found to be septic with Fornage gangrene/NF and was taken emergently to the OR.  She has underwent  multiple debridements  by general surgery so far.ID also consulted for the guidance for antibiotics. Planning for another debridement today.  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  Currently on ceftriaxone and flagyl.  Negative blood cultures.  So far showing Ecoli and anareobes on wound cultures.  Wound care as per general surgery. ID consultation done  for recommendation on antibiotics.  Still has significant leukocytosis.  Afebrile. General surgery planning for another debridement on 07/03/2018.  Stage II pressure ulcers of the right and left medial sacrum/right groin: Present on admission.  Wound care as per general surgery.  Optimize nutritional status.  Uncontrolled type 2 diabetes mellitus: Hemoglobin A1c of 12.1.  Follows with endocrinology.  Continue Lantus and sliding scale  insulin for now.  Diabetic coordinator  following.  COPD/asthma: Not on any home medications.  Currently COPD is stable.  We will put her on albuterol inhaler on discharge.  Follow-up with pulmonology as an  outpatient.  OSA: Continue CPAP at night.  Coronary artery disease: Continue metoprolol, statin.  Not on aspirin at home.  Hypertension: Continue metoprolol at increased dose .  Added lisinopril.PRN meds.  BP fluctuating.  Hyperlipidemia: Continue statin  Obesity: BMI of 36.  Dietitian following.  Nutrition Problem: Increased nutrient needs Etiology: wound healing, acute illness, post-op healing      DVT prophylaxis:Lovenox Code Status: Full Family Communication: None present at the bedside Disposition Plan: SNF after full  Management,surgical clearance   Consultants: General surgery,ID  Procedures:Multiple  I&D/debridgements   Antimicrobials:  Anti-infectives (From admission, onward)   Start     Dose/Rate Route Frequency Ordered Stop   06/28/18 1400  [MAR Hold]  metroNIDAZOLE (FLAGYL) tablet 500 mg     (MAR Hold since Wed 07/03/2018 at 1027. Reason: Transfer to a Procedural area.)   500 mg Oral Every 8 hours 06/28/18 1225     06/26/18 2200  vancomycin (VANCOCIN) 1,500 mg in sodium chloride 0.9 % 500 mL IVPB  Status:  Discontinued     1,500 mg 250 mL/hr over 120 Minutes Intravenous Every 24 hours 06/26/18 0446 06/28/18 1225   06/26/18 0600  clindamycin (CLEOCIN) IVPB 600 mg  Status:  Discontinued     600 mg 100 mL/hr over 30 Minutes Intravenous Every 8 hours 06/26/18 0508 06/28/18 1225   06/26/18 0415  clindamycin (CLEOCIN) IVPB 300 mg  Status:  Discontinued     300 mg 100 mL/hr over 30 Minutes Intravenous Every 8 hours 06/26/18 0356 06/26/18 0508   06/26/18 0400  cefTRIAXone (ROCEPHIN) 2 g in sodium chloride 0.9 % 100 mL  IVPB  Status:  Discontinued     2 g 200 mL/hr over 30 Minutes Intravenous Every 24 hours 06/26/18 0356 06/26/18 0409   06/25/18 2330  vancomycin (VANCOCIN) IVPB 1000 mg/200 mL premix     1,000 mg 200 mL/hr over 60 Minutes Intravenous  Once 06/25/18 1958 06/26/18 0015   06/25/18 1500  clindamycin (CLEOCIN) IVPB 600 mg     600 mg 100 mL/hr over 30  Minutes Intravenous  Once 06/25/18 1453 06/25/18 1657   06/25/18 1330  vancomycin (VANCOCIN) IVPB 1000 mg/200 mL premix     1,000 mg 200 mL/hr over 60 Minutes Intravenous  Once 06/25/18 1318 06/25/18 1606   06/25/18 1330  [MAR Hold]  cefTRIAXone (ROCEPHIN) 2 g in sodium chloride 0.9 % 100 mL IVPB     (MAR Hold since Wed 07/03/2018 at 1027. Reason: Transfer to a Procedural area.)   2 g 200 mL/hr over 30 Minutes Intravenous Every 24 hours 06/25/18 1318        Subjective: Patient seen and examined the bedside this morning.  Remains comfortable.  Well controlled pain.  No complaints.  Objective: Vitals:   07/03/18 0500 07/03/18 0600 07/03/18 0800 07/03/18 1034  BP:  131/61 128/66 134/80  Pulse:      Resp: 17  16 16   Temp: 99.3 F (37.4 C) 99 F (37.2 C) 98.8 F (37.1 C) 98.3 F (36.8 C)  TempSrc:    Oral  SpO2:    97%  Weight:      Height:        Intake/Output Summary (Last 24 hours) at 07/03/2018 1045 Last data filed at 07/03/2018 0600 Gross per 24 hour  Intake 589.03 ml  Output -  Net 589.03 ml     Examination:   General exam: Appears calm and comfortable ,Not in distress,obese HEENT:PERRL,Oral mucosa moist, Ear/Nose normal on gross exam, central line on the right neck Respiratory system: Bilateral equal air entry, normal vesicular breath sounds, no wheezes or crackles  Cardiovascular system: S1 & S2 heard, RRR. No JVD, murmurs, rubs, gallops or clicks. Gastrointestinal system: Abdomen is nondistended, soft and nontender. No organomegaly or masses felt. Normal bowel sounds heard. Central nervous system: Alert and oriented. No focal neurological deficits. Extremities: Edema of the right upper extremity, no clubbing ,no cyanosis, distal peripheral pulses palpable.  Skin/GU: Perineal wound wrapped with dressings           Data Reviewed: I have personally reviewed following labs and imaging studies  CBC: Recent Labs  Lab 06/28/18 0345 06/29/18 0453  06/30/18 0610 07/01/18 0458 07/02/18 0500 07/03/18 0638  WBC 23.8* 24.6* 24.4* 27.5* 30.0* 27.6*  NEUTROABS 22.6*  --  19.8* 22.0* 25.1* 22.3*  HGB 9.4* 9.0* 9.2* 9.3* 8.6* 8.3*  HCT 32.1* 31.1* 31.7* 30.9* 29.4* 27.8*  MCV 83.2 84.7 84.1 82.6 83.1 83.5  PLT 236 248 269 266 285 319   Basic Metabolic Panel: Recent Labs  Lab 06/28/18 0345 06/29/18 0453 07/01/18 0458 07/03/18 0019 07/03/18 0638  NA 137 138 135 134* 134*  K 3.8 4.1 3.8 3.9 3.8  CL 108 110 103 103 103  CO2 25 23 25 27 27   GLUCOSE 121* 152* 166* 233* 160*  BUN 13 15 12 14 13   CREATININE 0.58 0.50 0.51 0.63 0.52  CALCIUM 8.1* 8.1* 8.5* 8.1* 8.4*  MG  --   --   --  1.7  --    GFR: Estimated Creatinine Clearance: 70.6 mL/min (by C-G formula based on SCr of 0.52  mg/dL). Liver Function Tests: No results for input(s): AST, ALT, ALKPHOS, BILITOT, PROT, ALBUMIN in the last 168 hours. No results for input(s): LIPASE, AMYLASE in the last 168 hours. No results for input(s): AMMONIA in the last 168 hours. Coagulation Profile: No results for input(s): INR, PROTIME in the last 168 hours. Cardiac Enzymes: No results for input(s): CKTOTAL, CKMB, CKMBINDEX, TROPONINI in the last 168 hours. BNP (last 3 results) No results for input(s): PROBNP in the last 8760 hours. HbA1C: No results for input(s): HGBA1C in the last 72 hours. CBG: Recent Labs  Lab 07/02/18 1152 07/02/18 1712 07/02/18 2106 07/03/18 0755 07/03/18 1022  GLUCAP 165* 203* 271* 140* 130*   Lipid Profile: No results for input(s): CHOL, HDL, LDLCALC, TRIG, CHOLHDL, LDLDIRECT in the last 72 hours. Thyroid Function Tests: No results for input(s): TSH, T4TOTAL, FREET4, T3FREE, THYROIDAB in the last 72 hours. Anemia Panel: No results for input(s): VITAMINB12, FOLATE, FERRITIN, TIBC, IRON, RETICCTPCT in the last 72 hours. Sepsis Labs: No results for input(s): PROCALCITON, LATICACIDVEN in the last 168 hours.  Recent Results (from the past 240 hour(s))   Culture, blood (routine x 2)     Status: None   Collection Time: 06/25/18 12:10 PM  Result Value Ref Range Status   Specimen Description   Final    BLOOD LEFT ANTECUBITAL Performed at [facility], [address] [place], [place] [address]    Special Requests   Final    BOTTLES DRAWN AEROBIC AND ANAEROBIC Blood Culture adequate volume Performed at [facility], [address] [place], [place] [address]    Culture   Final    NO GROWTH 5 DAYS Performed at [facility], [address] [place], [place] [address]    Report Status 06/30/2018 FINAL  Final  Culture, blood (routine x 2)     Status: None   Collection Time: 06/25/18  2:06 PM  Result Value Ref Range Status   Specimen Description   Final    BLOOD LEFT ANTECUBITAL Performed at [facility], [address] [place], [place] [address]    Special Requests   Final    BOTTLES DRAWN AEROBIC AND ANAEROBIC Blood Culture adequate volume Performed at [facility], [address] [place], [place] [address]    Culture   Final    NO GROWTH 5 DAYS Performed at [facility], [address], [place], [place] [address]    Report Status 06/30/2018 FINAL  Final  Aerobic/Anaerobic Culture (surgical/deep wound)     Status: None   Collection Time: 06/25/18  5:18 PM  Result Value Ref Range Status   Specimen Description   Final    ABSCESS Performed at [facility], [address] [place], [place] [address]    Special Requests RECTAL  Final   Gram Stain   Final    FEW WBC PRESENT, PREDOMINANTLY PMN ABUNDANT GRAM NEGATIVE RODS ABUNDANT GRAM POSITIVE COCCI Performed at [facility], [address], [place], [place] [address]    Culture   Final    RARE ESCHERICHIA COLI MIXED ANAEROBIC FLORA PRESENT.  CALL LAB IF FURTHER IID REQUIRED.    Report Status 06/29/2018 FINAL  Final   Organism ID, Bacteria ESCHERICHIA COLI  Final       Susceptibility   Escherichia coli - MIC*    AMPICILLIN 16 INTERMEDIATE Intermediate     CEFAZOLIN <=4 SENSITIVE Sensitive     CEFEPIME <=1  SENSITIVE Sensitive     CEFTAZIDIME <=1 SENSITIVE Sensitive     CEFTRIAXONE <=1 SENSITIVE Sensitive     CIPROFLOXACIN >=4 RESISTANT Resistant     GENTAMICIN <=1 SENSITIVE Sensitive     IMIPENEM <=0.25 SENSITIVE Sensitive     TRIMETH/SULFA <=20 SENSITIVE Sensitive     AMPICILLIN/SULBACTAM 4 SENSITIVE Sensitive     PIP/TAZO <=4 SENSITIVE Sensitive     Extended ESBL NEGATIVE Sensitive     * RARE ESCHERICHIA COLI  Culture, blood (x 2)     Status: None   Collection Time: 06/25/18  7:52 PM  Result Value Ref Range Status   Specimen Description   Final    BLOOD ARTERIAL LINE Performed at [facility], [address] [place], [place] [address]    Special Requests   Final    BOTTLES DRAWN AEROBIC AND ANAEROBIC Blood Culture adequate volume Performed at [facility], [address] [place], [place] [address]    Culture   Final    NO GROWTH 5 DAYS Performed at [facility], [address] [place], [place] [address]    Report Status 06/30/2018 FINAL  Final  Culture, blood (x 2)     Status: None   Collection Time: 06/25/18  9:34 PM  Result Value Ref Range Status   Specimen Description BLOOD LEFT ARM  Final   Special Requests   Final    BOTTLES DRAWN AEROBIC ONLY Blood Culture results may not be optimal due to an inadequate volume of blood received in culture bottles   Culture   Final    NO GROWTH 5 DAYS Performed at [facility], [address], [place], [place] [address]    Report Status 06/30/2018 FINAL  Final  MRSA PCR Screening     Status: None   Collection Time: 06/25/18 10:19 PM  Result Value Ref Range Status   MRSA by PCR NEGATIVE NEGATIVE Final    Comment:        The GeneXpert MRSA Assay (FDA approved for NASAL specimens only), is one component of a comprehensive MRSA  colonization surveillance program. It is not intended to diagnose MRSA infection nor to guide or monitor treatment for MRSA infections. Performed at [facility], [address] [place], [place] [address]   Urine culture     Status: None   Collection Time: 06/26/18  1:35 PM  Result Value Ref Range Status   Specimen Description   Final    URINE, RANDOM Performed at [facility], [address] [place], [place] [address]    Special Requests   Final    Normal Performed at [facility], [address] [place], [place] [address]    Culture   Final    NO GROWTH Performed at [facility], [address] [place], [place] [address]    Report Status 06/27/2018 FINAL  Final         Radiology Studies: Vas [place] Upper Extremity Venous Duplex  Result Date: 07/02/2018 UPPER VENOUS STUDY  Indications: rule out DVT Limitations: Central line on site; immobility of patient's right arm. Comparison Study: No comparison study available. Performing Technologist: Melodie Bouillon  Examination Guidelines: A complete evaluation includes B-mode imaging, spectral Doppler, color Doppler, and power Doppler as needed of all accessible portions of each vessel. Bilateral testing is considered an integral part of a complete examination. Limited examinations for reoccurring indications may be performed as noted.  Right  Findings: +----------+------------+----------+---------+-----------+---------------------+ RIGHT     CompressiblePropertiesPhasicitySpontaneous       Summary        +----------+------------+----------+---------+-----------+---------------------+ IJV                                Yes       Yes     Not well visualized                                                      with compression due                                                       to central line on                                                                site          +----------+------------+----------+---------+-----------+---------------------+ Subclavian    Full                 Yes       Yes                          +----------+------------+----------+---------+-----------+---------------------+ Axillary      Full                 Yes       Yes                          +----------+------------+----------+---------+-----------+---------------------+ Brachial      Full                 Yes       Yes                          +----------+------------+----------+---------+-----------+---------------------+ Radial                                                 Not visualized     +----------+------------+----------+---------+-----------+---------------------+ Ulnar                                                  Not visualized     +----------+------------+----------+---------+-----------+---------------------+ Cephalic    Partial                                         Acute         +----------+------------+----------+---------+-----------+---------------------+ Basilic  Full                 Yes       Yes                          +----------+------------+----------+---------+-----------+---------------------+ Cephalic: distal to forearm unable to compress.  Left Findings: +----------+------------+----------+---------+-----------+-------+ LEFT      CompressiblePropertiesPhasicitySpontaneousSummary +----------+------------+----------+---------+-----------+-------+ Subclavian    Full                 Yes       Yes            +----------+------------+----------+---------+-----------+-------+  Summary:  Right: No evidence of thrombosis in the subclavian. Findings consistent with acute superficial vein thrombosis involving the right cephalic vein distal segment . This was a limited study due to patient cannot move her arm with tightedness position, distal segment of brachial veins unable to exam .   Left: No evidence of thrombosis in the subclavian.  *See table(s) above for measurements and observations.  Diagnosing physician: Waverly Ferrari MD Electronically signed by Waverly Ferrari MD on 07/02/2018 at 3:55:16 PM.    Final         Scheduled Meds: . [MAR Hold] chlorhexidine  15 mL Mouth Rinse BID  . [MAR Hold] Chlorhexidine Gluconate Cloth  6 each Topical Daily  . [MAR Hold] enoxaparin (LOVENOX) injection  40 mg Subcutaneous Q24H  . [MAR Hold] insulin aspart  0-5 Units Subcutaneous QHS  . [MAR Hold] insulin aspart  0-9 Units Subcutaneous TID WC  . [MAR Hold] insulin glargine  25 Units Subcutaneous Daily  . [MAR Hold] lisinopril  10 mg Oral Daily  . [MAR Hold] mouth rinse  15 mL Mouth Rinse q12n4p  . [MAR Hold] metoprolol succinate  50 mg Oral Daily  . [MAR Hold] metroNIDAZOLE  500 mg Oral Q8H  . [MAR Hold] multivitamin with minerals  1 tablet Oral Daily  . [MAR Hold] mupirocin ointment  1 application Nasal BID  . [MAR Hold] nutrition supplement (JUVEN)  1 packet Oral BID BM  . [MAR Hold] ENSURE MAX PROTEIN  11 oz Oral BID  . [MAR Hold] rosuvastatin  20 mg Oral Daily  . [MAR Hold] sodium chloride flush  10-40 mL Intracatheter Q12H   Continuous Infusions: . [MAR Hold] cefTRIAXone (ROCEPHIN)  IV Stopped (07/02/18 1622)     LOS: 8 days    Time spent:25 mins. More than 50% of that time was spent in counseling and/or coordination of care.      Burnadette Pop, MD [facility] Pager [phone]  If 7PM-7AM, please contact night-coverage [email or web address] Password [email or web address] 07/03/2018, 10:45 AM

## 2018-07-03 NOTE — Transfer of Care (Signed)
Immediate Anesthesia Transfer of Care Note  Patient: Courtney [name]  Procedure(s) Performed: DEBRIDEMENT PELVIS WITH DRESSING CHANGE (N/A )  Patient Location: PACU  Anesthesia Type:General  Level of Consciousness: awake, alert , oriented and patient cooperative  Airway & Oxygen Therapy: Patient Spontanous Breathing and Patient connected to face mask  Post-op Assessment: Report given to RN and Post -op Vital signs reviewed and stable  Post vital signs: Reviewed and stable  Last Vitals:  Vitals Value Taken Time  BP 144/86 07/03/2018 12:18 PM  Temp    Pulse 87 07/03/2018 12:20 PM  Resp 15 07/03/2018 12:20 PM  SpO2 100 % 07/03/2018 12:20 PM  Vitals shown include unvalidated device data.  Last Pain:  Vitals:   07/03/18 1034  TempSrc: Oral  PainSc:       Patients Stated Pain Goal: 0 (06/28/18 1250)  Complications: No apparent anesthesia complications

## 2018-07-03 NOTE — Progress Notes (Signed)
Patient has been having episodes of nonsustained SVT up to the 180s. Schorr, NP paged. Awaiting response. Will continue to monitor.

## 2018-07-03 NOTE — Anesthesia Postprocedure Evaluation (Signed)
Anesthesia Post Note  Patient: Courtney [name]  Procedure(s) Performed: DEBRIDEMENT PELVIS WITH DRESSING CHANGE (N/A )     Patient location during evaluation: PACU Anesthesia Type: General Level of consciousness: awake and alert Pain management: pain level controlled Vital Signs Assessment: post-procedure vital signs reviewed and stable Respiratory status: spontaneous breathing, nonlabored ventilation, respiratory function stable and patient connected to nasal cannula oxygen Cardiovascular status: blood pressure returned to baseline and stable Postop Assessment: no apparent nausea or vomiting Anesthetic complications: no    Last Vitals:  Vitals:   07/03/18 1254 07/03/18 1300  BP: (!) 169/81 (!) 158/84  Pulse: 83 84  Resp: 19 (!) 24  Temp: 36.7 C   SpO2: 98% 95%    Last Pain:  Vitals:   07/03/18 1245  TempSrc:   PainSc: 0-No pain                 Kennedee Kitzmiller

## 2018-07-03 NOTE — Progress Notes (Signed)
Peripherally Inserted Central Catheter/Midline Placement  The IV Nurse has discussed with the patient and/or persons authorized to consent for the patient, the purpose of this procedure and the potential benefits and risks involved with this procedure.  The benefits include less needle sticks, lab draws from the catheter, and the patient may be discharged home with the catheter. Risks include, but not limited to, infection, bleeding, blood clot (thrombus formation), and puncture of an artery; nerve damage and irregular heartbeat and possibility to perform a PICC exchange if needed/ordered by physician.  Alternatives to this procedure were also discussed.  Bard Power PICC patient education guide, fact sheet on infection prevention and patient information card has been provided to patient /or left at bedside.    PICC/Midline Placement Documentation  PICC Single Lumen 07/03/18 PICC Left Basilic 43 cm 1 cm (Active)  Indication for Insertion or Continuance of Line Prolonged intravenous therapies 07/03/2018  5:42 PM  Exposed Catheter (cm) 1 cm 07/03/2018  5:42 PM  Site Assessment Clean;Dry;Intact 07/03/2018  5:42 PM  Line Status Flushed;Blood return noted 07/03/2018  5:42 PM  Dressing Type Transparent 07/03/2018  5:42 PM  Dressing Status Clean;Dry;Intact;Antimicrobial disc in place 07/03/2018  5:42 PM  Dressing Intervention New dressing 07/03/2018  5:42 PM  Dressing Change Due 07/10/18 07/03/2018  5:42 PM    Husband signed consent at bedside   Maximino Greenland 07/03/2018, 5:44 PM

## 2018-07-04 ENCOUNTER — Encounter (HOSPITAL_COMMUNITY): Payer: Self-pay | Admitting: General Surgery

## 2018-07-04 DIAGNOSIS — E13628 Other specified diabetes mellitus with other skin complications: Secondary | ICD-10-CM

## 2018-07-04 DIAGNOSIS — Z794 Long term (current) use of insulin: Secondary | ICD-10-CM

## 2018-07-04 LAB — CBC
HCT: 27.7 % — ABNORMAL LOW (ref 36.0–46.0)
Hemoglobin: 8.2 g/dL — ABNORMAL LOW (ref 12.0–15.0)
MCH: 24.9 pg — ABNORMAL LOW (ref 26.0–34.0)
MCHC: 29.6 g/dL — ABNORMAL LOW (ref 30.0–36.0)
MCV: 84.2 fL (ref 80.0–100.0)
PLATELETS: 368 10*3/uL (ref 150–400)
RBC: 3.29 MIL/uL — ABNORMAL LOW (ref 3.87–5.11)
RDW: 15.3 % (ref 11.5–15.5)
WBC: 30.4 10*3/uL — AB (ref 4.0–10.5)
nRBC: 0.1 % (ref 0.0–0.2)

## 2018-07-04 LAB — GLUCOSE, CAPILLARY
Glucose-Capillary: 149 mg/dL — ABNORMAL HIGH (ref 70–99)
Glucose-Capillary: 199 mg/dL — ABNORMAL HIGH (ref 70–99)
Glucose-Capillary: 206 mg/dL — ABNORMAL HIGH (ref 70–99)
Glucose-Capillary: 275 mg/dL — ABNORMAL HIGH (ref 70–99)

## 2018-07-04 LAB — BASIC METABOLIC PANEL
Anion gap: 5 (ref 5–15)
BUN: 12 mg/dL (ref 8–23)
CO2: 27 mmol/L (ref 22–32)
Calcium: 8.4 mg/dL — ABNORMAL LOW (ref 8.9–10.3)
Chloride: 104 mmol/L (ref 98–111)
Creatinine, Ser: 0.59 mg/dL (ref 0.44–1.00)
GFR calc Af Amer: >60 mL/min (ref >60)
Glucose, Bld: 159 mg/dL — ABNORMAL HIGH (ref 70–99)
Potassium: 3.8 mmol/L (ref 3.5–5.1)
Sodium: 136 mmol/L (ref 135–145)

## 2018-07-04 LAB — MAGNESIUM: Magnesium: 2.1 mg/dL (ref 1.7–2.4)

## 2018-07-04 MED ORDER — SODIUM CHLORIDE 0.9 % IV SOLN
INTRAVENOUS | Status: DC | PRN
  Administered 2018-07-04: 500 mL via INTRAVENOUS

## 2018-07-04 NOTE — Care Management Note (Signed)
Case Management Note  Patient Details  Name: Courtney [name] MRN: [id number] Date of Birth: 1946-04-02  Subjective/Objective:                    Action/Plan: Pt has bed offers for [facility].    Expected Discharge Date:  (unknown)               Expected Discharge Plan:  Skilled Nursing Facility  In-House Referral:  Clinical Social Work  Discharge planning Services  CM Consult  Post Acute Care Choice:    Choice offered to:  Patient  DME Arranged:    DME Agency:     HH Arranged:    HH Agency:     Status of Service:  In process, will continue to follow  If discussed at Long Length of Stay Meetings, dates discussed:    Additional CommentsGeni Bers, RN 07/04/2018, 10:27 AM

## 2018-07-04 NOTE — Progress Notes (Signed)
INFECTIOUS DISEASE PROGRESS NOTE  ID: Courtney [name] is a 73 y.o. 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 Active Problems:   HTN (hypertension)   Normocytic anemia   HLD (hyperlipidemia)   Restless leg syndrome   COPD (chronic obstructive pulmonary disease) (HCC)   CAD (coronary artery disease)   OSA and COPD overlap syndrome (HCC)   Pressure injury of skin   DM (diabetes mellitus) (HCC)   Cigarette smoker   Sepsis (HCC)  Subjective: C/o pain.   Abtx:  Anti-infectives (From admission, onward)   Start     Dose/Rate Route Frequency Ordered Stop   06/28/18 1400  metroNIDAZOLE (FLAGYL) tablet 500 mg     500 mg Oral Every 8 hours 06/28/18 1225     06/26/18 2200  vancomycin (VANCOCIN) 1,500 mg in sodium chloride 0.9 % 500 mL IVPB  Status:  Discontinued     1,500 mg 250 mL/hr over 120 Minutes Intravenous Every 24 hours 06/26/18 0446 06/28/18 1225   06/26/18 0600  clindamycin (CLEOCIN) IVPB 600 mg  Status:  Discontinued     600 mg 100 mL/hr over 30 Minutes Intravenous Every 8 hours 06/26/18 0508 06/28/18 1225   06/26/18 0415  clindamycin (CLEOCIN) IVPB 300 mg  Status:  Discontinued     300 mg 100 mL/hr over 30 Minutes Intravenous Every 8 hours 06/26/18 0356 06/26/18 0508   06/26/18 0400  cefTRIAXone (ROCEPHIN) 2 g in sodium chloride 0.9 % 100 mL IVPB  Status:  Discontinued     2 g 200 mL/hr over 30 Minutes Intravenous Every 24 hours 06/26/18 0356 06/26/18 0409   06/25/18 2330  vancomycin (VANCOCIN) IVPB 1000 mg/200 mL premix     1,000 mg 200 mL/hr over 60 Minutes Intravenous  Once 06/25/18 1958 06/26/18 0015   06/25/18 1500  clindamycin (CLEOCIN) IVPB 600 mg     600 mg 100 mL/hr over 30 Minutes Intravenous  Once 06/25/18 1453 06/25/18 1657   06/25/18 1330  vancomycin (VANCOCIN) IVPB 1000 mg/200 mL premix     1,000 mg 200 mL/hr over 60 Minutes Intravenous  Once 06/25/18 1318 06/25/18 1606   06/25/18 1330  cefTRIAXone (ROCEPHIN) 2 g in sodium chloride  0.9 % 100 mL IVPB     2 g 200 mL/hr over 30 Minutes Intravenous Every 24 hours 06/25/18 1318        Medications:  Scheduled: . chlorhexidine  15 mL Mouth Rinse BID  . Chlorhexidine Gluconate Cloth  6 each Topical Daily  . Chlorhexidine Gluconate Cloth  6 each Topical Daily  . enoxaparin (LOVENOX) injection  40 mg Subcutaneous Q24H  . insulin aspart  0-5 Units Subcutaneous QHS  . insulin aspart  0-9 Units Subcutaneous TID WC  . insulin glargine  25 Units Subcutaneous Daily  . lisinopril  10 mg Oral Daily  . mouth rinse  15 mL Mouth Rinse q12n4p  . metoprolol succinate  50 mg Oral Daily  . metroNIDAZOLE  500 mg Oral Q8H  . multivitamin with minerals  1 tablet Oral Daily  . nutrition supplement (JUVEN)  1 packet Oral BID BM  . ENSURE MAX PROTEIN  11 oz Oral BID  . rosuvastatin  20 mg Oral Daily  . sodium chloride flush  10-40 mL Intracatheter Q12H  . sodium chloride flush  10-40 mL Intracatheter Q12H    Objective: Vital signs in last 24 hours: Temp:  [97.9 F (36.6 C)-98.7 F (37.1 C)] 98.6 F (37 C) (03/05 1200) Pulse Rate:  [  42-92] 83 (03/05 0800) Resp:  [11-24] 14 (03/05 0800) BP: (105-181)/(43-112) 111/43 (03/05 0800) SpO2:  [94 %-100 %] 99 % (03/05 0800)   General appearance: alert and mild distress Resp: clear to auscultation bilaterally Cardio: regular rate and rhythm GI: normal findings: soft, non-tender and abnormal findings:  hypoactive bowel sounds Extremities: edema anasarca  Lab Results Recent Labs    07/03/18 0638 07/04/18 0508  WBC 27.6* 30.4*  HGB 8.3* 8.2*  HCT 27.8* 27.7*  NA 134* 136  K 3.8 3.8  CL 103 104  CO2 27 27  BUN 13 12  CREATININE 0.52 0.59   Liver Panel No results for input(s): PROT, ALBUMIN, AST, ALT, ALKPHOS, BILITOT, BILIDIR, IBILI in the last 72 hours. Sedimentation Rate No results for input(s): ESRSEDRATE in the last 72 hours. C-Reactive Protein No results for input(s): CRP in the last 72 hours.  Microbiology: Recent  Results (from the past 240 hour(s))  Culture, blood (routine x 2)     Status: None   Collection Time: 06/25/18 12:10 PM  Result Value Ref Range Status   Specimen Description   Final    BLOOD LEFT ANTECUBITAL Performed at [facility], [address] [place], [place] [address]    Special Requests   Final    BOTTLES DRAWN AEROBIC AND ANAEROBIC Blood Culture adequate volume Performed at [facility], [address] [place], [place] [address]    Culture   Final    NO GROWTH 5 DAYS Performed at [facility], [address] [place], [place] [address]    Report Status 06/30/2018 FINAL  Final  Culture, blood (routine x 2)     Status: None   Collection Time: 06/25/18  2:06 PM  Result Value Ref Range Status   Specimen Description   Final    BLOOD LEFT ANTECUBITAL Performed at [facility], [address] [place], [place] [address]    Special Requests   Final    BOTTLES DRAWN AEROBIC AND ANAEROBIC Blood Culture adequate volume Performed at [facility], [address], [place], [place] [address]    Culture   Final    NO GROWTH 5 DAYS Performed at [facility], [address] [place], [place] [address]    Report Status 06/30/2018 FINAL  Final  Aerobic/Anaerobic Culture (surgical/deep wound)     Status: None   Collection Time: 06/25/18  5:18 PM  Result Value Ref Range Status   Specimen Description   Final    ABSCESS Performed at [facility], [address] [place], [place] [address]    Special Requests RECTAL  Final   Gram Stain   Final    FEW WBC PRESENT, PREDOMINANTLY PMN ABUNDANT GRAM NEGATIVE RODS ABUNDANT GRAM POSITIVE COCCI Performed at [facility], [address], [place], [place] [address]    Culture   Final    RARE ESCHERICHIA COLI MIXED ANAEROBIC FLORA PRESENT.  CALL LAB IF FURTHER IID REQUIRED.    Report Status 06/29/2018 FINAL  Final   Organism  ID, Bacteria ESCHERICHIA COLI  Final      Susceptibility   Escherichia coli - MIC*    AMPICILLIN 16 INTERMEDIATE Intermediate     CEFAZOLIN <=4 SENSITIVE Sensitive     CEFEPIME <=1 SENSITIVE Sensitive     CEFTAZIDIME <=1 SENSITIVE Sensitive     CEFTRIAXONE <=1 SENSITIVE Sensitive     CIPROFLOXACIN >=4 RESISTANT Resistant  GENTAMICIN <=1 SENSITIVE Sensitive     IMIPENEM <=0.25 SENSITIVE Sensitive     TRIMETH/SULFA <=20 SENSITIVE Sensitive     AMPICILLIN/SULBACTAM 4 SENSITIVE Sensitive     PIP/TAZO <=4 SENSITIVE Sensitive     Extended ESBL NEGATIVE Sensitive     * RARE ESCHERICHIA COLI  Culture, blood (x 2)     Status: None   Collection Time: 06/25/18  7:52 PM  Result Value Ref Range Status   Specimen Description   Final    BLOOD ARTERIAL LINE Performed at [facility], [address], [place], [place] [address]    Special Requests   Final    BOTTLES DRAWN AEROBIC AND ANAEROBIC Blood Culture adequate volume Performed at [facility], [address] [place], [place] [address]    Culture   Final    NO GROWTH 5 DAYS Performed at [facility], [address], [place], [place] [address]    Report Status 06/30/2018 FINAL  Final  Culture, blood (x 2)     Status: None   Collection Time: 06/25/18  9:34 PM  Result Value Ref Range Status   Specimen Description BLOOD LEFT ARM  Final   Special Requests   Final    BOTTLES DRAWN AEROBIC ONLY Blood Culture results may not be optimal due to an inadequate volume of blood received in culture bottles   Culture   Final    NO GROWTH 5 DAYS Performed at [facility], [address], [place], [place] [address]    Report Status 06/30/2018 FINAL  Final  MRSA PCR Screening     Status: None   Collection Time: 06/25/18 10:19 PM  Result Value Ref Range Status   MRSA by PCR NEGATIVE NEGATIVE Final    Comment:        The GeneXpert MRSA Assay (FDA approved for NASAL specimens only), is one  component of a comprehensive MRSA colonization surveillance program. It is not intended to diagnose MRSA infection nor to guide or monitor treatment for MRSA infections. Performed at [facility], [address] [place], [place] [address]   Urine culture     Status: None   Collection Time: 06/26/18  1:35 PM  Result Value Ref Range Status   Specimen Description   Final    URINE, RANDOM Performed at [facility], [address] [place], [place] [address]    Special Requests   Final    Normal Performed at [facility], [address] [place], [place] [address]    Culture   Final    NO GROWTH Performed at [facility], [address] [place], [place] [address]    Report Status 06/27/2018 FINAL  Final  Surgical PCR screen     Status: None   Collection Time: 07/03/18 10:13 AM  Result Value Ref Range Status   MRSA, PCR NEGATIVE NEGATIVE Final   Staphylococcus aureus NEGATIVE NEGATIVE Final    Comment: (NOTE) The Xpert SA Assay (FDA approved for NASAL specimens in patients 20 years of age and older), is one component of a comprehensive surveillance program. It is not intended to diagnose infection nor to guide or monitor treatment. Performed at [facility], [address], [place], [place] [address]     Studies/Results: Vas [place] Upper Extremity Venous Duplex  Result Date: 07/02/2018 UPPER VENOUS STUDY  Indications: rule out DVT Limitations: [place] line on site; immobility of patient's right arm.  Comparison Study: No comparison study available. Performing Technologist: Melodie Bouillon  Examination Guidelines: A complete evaluation includes B-mode imaging, spectral Doppler, color Doppler, and power Doppler as needed of all accessible portions of each vessel. Bilateral testing is considered an integral part of a complete examination. Limited examinations for reoccurring indications may be performed as  noted.  Right Findings: +----------+------------+----------+---------+-----------+---------------------+ RIGHT     CompressiblePropertiesPhasicitySpontaneous       Summary        +----------+------------+----------+---------+-----------+---------------------+ IJV                                Yes       Yes     Not well visualized                                                      with compression due                                                       to central line on                                                               site          +----------+------------+----------+---------+-----------+---------------------+ Subclavian    Full                 Yes       Yes                          +----------+------------+----------+---------+-----------+---------------------+ Axillary      Full                 Yes       Yes                          +----------+------------+----------+---------+-----------+---------------------+ Brachial      Full                 Yes       Yes                          +----------+------------+----------+---------+-----------+---------------------+ Radial                                                 Not visualized     +----------+------------+----------+---------+-----------+---------------------+ Ulnar                                                  Not visualized     +----------+------------+----------+---------+-----------+---------------------+ Cephalic  Partial                                         Acute         +----------+------------+----------+---------+-----------+---------------------+ Basilic       Full                 Yes       Yes                          +----------+------------+----------+---------+-----------+---------------------+ Cephalic: distal to forearm unable to compress.  Left Findings: +----------+------------+----------+---------+-----------+-------+ LEFT       CompressiblePropertiesPhasicitySpontaneousSummary +----------+------------+----------+---------+-----------+-------+ Subclavian    Full                 Yes       Yes            +----------+------------+----------+---------+-----------+-------+  Summary:  Right: No evidence of thrombosis in the subclavian. Findings consistent with acute superficial vein thrombosis involving the right cephalic vein distal segment . This was a limited study due to patient cannot move her arm with tightedness position, distal segment of brachial veins unable to exam .  Left: No evidence of thrombosis in the subclavian.  *See table(s) above for measurements and observations.  Diagnosing physician: Waverly Ferrari MD Electronically signed by Waverly Ferrari MD on 07/02/2018 at 3:55:16 PM.    Final    [place] Ekg Site Rite  Result Date: 07/03/2018 If Site Rite image not attached, placement could not be confirmed due to current cardiac rhythm.    Assessment/Plan: Fournier's Gangrene Debrided 3-3, 2-27 Cx E coli, mixed anaerobes DM2, poorly controlled A1C 12.1% HTN Superficial RUE DVT   Total days of antibiotics:10 (ceftriaxone/flagyl)         Appreciate surgical f/u, does not appear to have ongoing necrotizing infection Re-look in AM WBC stable, afeb Family updated  Johny Sax MD, FACP Infectious Diseases (pager) [phone] [email or web address] 07/04/2018, 12:16 PM  LOS: 9 days

## 2018-07-04 NOTE — Progress Notes (Signed)
OT Cancellation Note  Patient Details Name: Courtney [name] MRN: [id number] DOB: Dec 27, 1945   Cancelled Treatment:    Reason Eval/Treat Not Completed: Other (comment).  Pt had bedside debridement this am (and sx debridement yesterday). Will check back another day  Reeva Davern 07/04/2018, 10:37 AM  Marica Otter, OTR/L Acute Rehabilitation Services [phone] [facility] pager [phone] office 07/04/2018

## 2018-07-04 NOTE — Progress Notes (Signed)
PT Cancellation Note  Patient Details Name: Courtney [name] MRN: [id number] DOB: [birth year]   Cancelled Treatment:    Reason Eval/Treat Not Completed: Medical issues which prohibited therapy, recent dressing chang. woll check back 3/9   Rada Hay 07/04/2018, 11:36 AM  Blanchard Kelch PT Acute Rehabilitation Services Pager [phone] Office [phone]

## 2018-07-04 NOTE — Addendum Note (Signed)
Addendum  created 07/04/18 1948 by Val Eagle, MD   Clinical Note Signed, Intraprocedure Blocks edited

## 2018-07-04 NOTE — Progress Notes (Signed)
PROGRESS NOTE    Courtney [name]  UTM:[id number] DOB: 1945-08-01 DOA: 06/25/2018 PCP: Jordan Hawks, PA-C   Brief Narrative: Patient is a 73 year old female with history of uncontrolled type diabetes mellitus, coronary artery disease, OSA on CPAP, asthma, COPD who presents to the emergency department with complaints of generalized weakness, gluteal abscess for several days.  She was found to be septic with [place] gangrene/NF and was taken emergently to the OR.  She has underwent  multiple debridements  by general surgery so far.ID also consulted for the guidance for antibiotics.  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  Currently on ceftriaxone and flagyl.  Negative blood cultures.  So far showing Ecoli and anareobes on wound cultures.  Wound care as per general surgery. ID consultation done  for recommendation on antibiotics.  Still has significant leukocytosis.  Afebrile.  Stage II pressure ulcers of the right and left medial sacrum/right groin: Present on admission.  Wound care as per general surgery.  Optimize nutritional status.  Uncontrolled type 2 diabetes mellitus: Hemoglobin A1c of 12.1.  Follows with endocrinology.  Continue Lantus and sliding scale  insulin for now.  Diabetic coordinator  following.  COPD/asthma: Not on any home medications.  Currently COPD is stable.  We will put her on albuterol inhaler on discharge.  Follow-up with pulmonology as an outpatient.  OSA: Continue CPAP at night.  Coronary artery disease: Continue metoprolol, statin.   Not on aspirin at home.  Hypertension: Continue metoprolol at increased dose .  Added lisinopril.PRN meds.  BP fluctuating.  Hyperlipidemia: Continue statin  Obesity: BMI of 36.  Dietitian following.  Deconditioning/debility: Physical therapy evaluated her and recommended skilled services on discharge.  She has a bed offer at [facility]. Will discharge her there if surgery is not planning for any more interventions.  Nutrition Problem: Increased nutrient needs Etiology: wound healing, acute illness, post-op healing      DVT prophylaxis:Lovenox Code Status: Full Family Communication: None present at the bedside Disposition Plan: SNF after full  Management,surgical clearance   Consultants: General surgery,ID  Procedures:Multiple  I&D/debridgements   Antimicrobials:  Anti-infectives (From admission, onward)   Start     Dose/Rate Route Frequency Ordered Stop   06/28/18 1400  metroNIDAZOLE (FLAGYL) tablet 500 mg     500 mg Oral Every 8 hours 06/28/18 1225     06/26/18 2200  vancomycin (VANCOCIN) 1,500 mg in sodium chloride 0.9 % 500 mL IVPB  Status:  Discontinued     1,500 mg 250 mL/hr over 120 Minutes Intravenous Every 24 hours 06/26/18 0446 06/28/18 1225   06/26/18 0600  clindamycin (CLEOCIN) IVPB 600 mg  Status:  Discontinued     600 mg 100 mL/hr over 30 Minutes Intravenous Every 8 hours 06/26/18 0508 06/28/18 1225   06/26/18 0415  clindamycin (CLEOCIN) IVPB 300 mg  Status:  Discontinued     300 mg 100 mL/hr over 30 Minutes Intravenous Every 8 hours 06/26/18 0356 06/26/18 0508   06/26/18 0400  cefTRIAXone (ROCEPHIN) 2 g in sodium chloride 0.9 %  100 mL IVPB  Status:  Discontinued     2 g 200 mL/hr over 30 Minutes Intravenous Every 24 hours 06/26/18 0356 06/26/18 0409   06/25/18 2330  vancomycin (VANCOCIN) IVPB 1000 mg/200 mL premix     1,000 mg 200 mL/hr over 60 Minutes Intravenous  Once 06/25/18 1958 06/26/18 0015   06/25/18 1500  clindamycin (CLEOCIN) IVPB 600 mg     600  mg 100 mL/hr over 30 Minutes Intravenous  Once 06/25/18 1453 06/25/18 1657   06/25/18 1330  vancomycin (VANCOCIN) IVPB 1000 mg/200 mL premix     1,000 mg 200 mL/hr over 60 Minutes Intravenous  Once 06/25/18 1318 06/25/18 1606   06/25/18 1330  cefTRIAXone (ROCEPHIN) 2 g in sodium chloride 0.9 % 100 mL IVPB     2 g 200 mL/hr over 30 Minutes Intravenous Every 24 hours 06/25/18 1318        Subjective: Patient seen and examined the bedside this morning.  Was comfortable.  Hemodynamically stable.  No complaints.  Pain well controlled  Objective: Vitals:   07/04/18 0500 07/04/18 0600 07/04/18 0800 07/04/18 1200  BP:  (!) 120/45 (!) 111/43 (!) 111/51  Pulse: 81 80 83 87  Resp: (!) Temp:   98 F (36.7 C) 98.6 F (37 C)  TempSrc:   Axillary Axillary  SpO2: 97% 98% 99% 100%  Weight:      Height:        Intake/Output Summary (Last 24 hours) at 07/04/2018 1317 Last data filed at 07/04/2018 0930 Gross per 24 hour  Intake 460.06 ml  Output 650 ml  Net -189.94 ml     Examination:   General exam: Appears calm and comfortable ,Not in distress,obese HEENT:PERRL,Oral mucosa moist, Ear/Nose normal on gross exam Respiratory system: Bilateral equal air entry, normal vesicular breath sounds, no wheezes or crackles  Cardiovascular system: S1 & S2 heard, RRR. No JVD, murmurs, rubs, gallops or clicks. Gastrointestinal system: Abdomen is nondistended, soft and nontender. No organomegaly or masses felt. Normal bowel sounds heard. Central nervous system: Alert and oriented. No focal neurological deficits. Extremities: Edema of the right upper extremity, no clubbing ,no cyanosis, distal peripheral pulses palpable. Skin/GU: Perineal wound wrapped with dressings           Data Reviewed: I have personally reviewed following labs and imaging studies  CBC: Recent Labs  Lab 06/28/18 0345  06/30/18 0610 07/01/18 0458 07/02/18 0500 07/03/18 0638 07/04/18 0508  WBC 23.8*   < >  24.4* 27.5* 30.0* 27.6* 30.4*  NEUTROABS 22.6*  --  19.8* 22.0* 25.1* 22.3*  --   HGB 9.4*   < > 9.2* 9.3* 8.6* 8.3* 8.2*  HCT 32.1*   < > 31.7* 30.9* 29.4* 27.8* 27.7*  MCV 83.2   < > 84.1 82.6 83.1 83.5 84.2  PLT 236   < > 269 266 285 319 368   < > = values in this interval not displayed.   Basic Metabolic Panel: Recent Labs  Lab 06/29/18 0453 07/01/18 0458 07/03/18 0019 07/03/18 0638 07/04/18 0508  NA 138 135 134* 134* 136  K 4.1 3.8 3.9 3.8 3.8  CL 110 103 103 103 104  CO2 GLUCOSE 152* 166* 233* 160* 159*  BUN CREATININE 0.50 0.51 0.63 0.52 0.59  CALCIUM 8.1* 8.5* 8.1* 8.4* 8.4*  MG  --   --  1.7  --  2.1   GFR: Estimated  Creatinine Clearance: 70.6 mL/min (by C-G formula based on SCr of 0.59 mg/dL). Liver Function Tests: No results for input(s): AST, ALT, ALKPHOS, BILITOT, PROT, ALBUMIN in the last 168 hours. No results for input(s): LIPASE, AMYLASE in the last 168 hours. No results for input(s): AMMONIA in the last 168 hours. Coagulation Profile: No results for input(s): INR, PROTIME in the last 168 hours. Cardiac Enzymes: No results for input(s): CKTOTAL, CKMB, CKMBINDEX, TROPONINI in the last 168 hours. BNP (last 3 results) No results for input(s): PROBNP in the last 8760 hours. HbA1C: No results for input(s): HGBA1C in the last 72 hours. CBG: Recent Labs  Lab 07/03/18 1235 07/03/18 1635 07/03/18 2103 07/04/18 0747 07/04/18 1150  GLUCAP 112* 162* 213* 149* 199*   Lipid Profile: No results for input(s): CHOL, HDL, LDLCALC, TRIG, CHOLHDL, LDLDIRECT in the last 72 hours. Thyroid Function Tests: No results for input(s): TSH, T4TOTAL, FREET4, T3FREE, THYROIDAB in the last 72 hours. Anemia Panel: No results for input(s): VITAMINB12, FOLATE, FERRITIN, TIBC, IRON, RETICCTPCT in the last 72 hours. Sepsis Labs: No results for input(s): PROCALCITON, LATICACIDVEN in the last 168 hours.  Recent Results (from the past 240 hour(s))    Culture, blood (routine x 2)     Status: None   Collection Time: 06/25/18 12:10 PM  Result Value Ref Range Status   Specimen Description   Final    BLOOD LEFT ANTECUBITAL Performed at [facility], [address], [place], [place] [address]    Special Requests   Final    BOTTLES DRAWN AEROBIC AND ANAEROBIC Blood Culture adequate volume Performed at [facility], [address], [place], [place] [address]    Culture   Final    NO GROWTH 5 DAYS Performed at [facility], [address], [place], [place] [address]    Report Status 06/30/2018 FINAL  Final  Culture, blood (routine x 2)     Status: None   Collection Time: 06/25/18  2:06 PM  Result Value Ref Range Status   Specimen Description   Final    BLOOD LEFT ANTECUBITAL Performed at [facility], [address] [place], [place] [address]    Special Requests   Final    BOTTLES DRAWN AEROBIC AND ANAEROBIC Blood Culture adequate volume Performed at [facility], [address], [place], [place] [address]    Culture   Final    NO GROWTH 5 DAYS Performed at [facility], [address], [place], [place] [address]    Report Status 06/30/2018 FINAL  Final  Aerobic/Anaerobic Culture (surgical/deep wound)     Status: None   Collection Time: 06/25/18  5:18 PM  Result Value Ref Range Status   Specimen Description   Final    ABSCESS Performed at [facility], [address], [place], [place] [address]    Special Requests RECTAL  Final   Gram Stain   Final    FEW WBC PRESENT, PREDOMINANTLY PMN ABUNDANT GRAM NEGATIVE RODS ABUNDANT GRAM POSITIVE COCCI Performed at [facility], [address], [place], [place] [address]    Culture   Final    RARE ESCHERICHIA COLI MIXED ANAEROBIC FLORA PRESENT.  CALL LAB IF FURTHER IID REQUIRED.    Report Status 06/29/2018 FINAL  Final   Organism ID, Bacteria ESCHERICHIA COLI  Final       Susceptibility   Escherichia coli - MIC*    AMPICILLIN 16 INTERMEDIATE Intermediate  CEFAZOLIN <=4 SENSITIVE Sensitive     CEFEPIME <=1 SENSITIVE Sensitive     CEFTAZIDIME <=1 SENSITIVE Sensitive     CEFTRIAXONE <=1 SENSITIVE Sensitive     CIPROFLOXACIN >=4 RESISTANT Resistant     GENTAMICIN <=1 SENSITIVE Sensitive     IMIPENEM <=0.25 SENSITIVE Sensitive     TRIMETH/SULFA <=20 SENSITIVE Sensitive     AMPICILLIN/SULBACTAM 4 SENSITIVE Sensitive     PIP/TAZO <=4 SENSITIVE Sensitive     Extended ESBL NEGATIVE Sensitive     * RARE ESCHERICHIA COLI  Culture, blood (x 2)     Status: None   Collection Time: 06/25/18  7:52 PM  Result Value Ref Range Status   Specimen Description   Final    BLOOD ARTERIAL LINE Performed at [facility], [address] [place], [place] [address]    Special Requests   Final    BOTTLES DRAWN AEROBIC AND ANAEROBIC Blood Culture adequate volume Performed at [facility], [address] [place], [place] [address]    Culture   Final    NO GROWTH 5 DAYS Performed at [facility], [address] [place], [place] [address]    Report Status 06/30/2018 FINAL  Final  Culture, blood (x 2)     Status: None   Collection Time: 06/25/18  9:34 PM  Result Value Ref Range Status   Specimen Description BLOOD LEFT ARM  Final   Special Requests   Final    BOTTLES DRAWN AEROBIC ONLY Blood Culture results may not be optimal due to an inadequate volume of blood received in culture bottles   Culture   Final    NO GROWTH 5 DAYS Performed at [facility], [address] [place], [place] [address]    Report Status 06/30/2018 FINAL  Final  MRSA PCR Screening     Status: None   Collection Time: 06/25/18 10:19 PM  Result Value Ref Range Status   MRSA by PCR NEGATIVE NEGATIVE Final    Comment:        The GeneXpert MRSA Assay (FDA approved for NASAL specimens only), is one component of a comprehensive MRSA  colonization surveillance program. It is not intended to diagnose MRSA infection nor to guide or monitor treatment for MRSA infections. Performed at [facility], [address], [place], [place] [address]   Urine culture     Status: None   Collection Time: 06/26/18  1:35 PM  Result Value Ref Range Status   Specimen Description   Final    URINE, RANDOM Performed at [facility], [address] [place], [place] [address]    Special Requests   Final    Normal Performed at [facility], [address], [place], [place] [address]    Culture   Final    NO GROWTH Performed at [facility], [address], [place], [place] [address]    Report Status 06/27/2018 FINAL  Final  Surgical PCR screen     Status: None   Collection Time: 07/03/18 10:13 AM  Result Value Ref Range Status   MRSA, PCR NEGATIVE NEGATIVE Final   Staphylococcus aureus NEGATIVE NEGATIVE Final    Comment: (NOTE) The Xpert SA Assay (FDA approved for NASAL specimens in patients 34 years of age and older), is one component of a comprehensive surveillance program. It is not intended to diagnose infection nor to guide or monitor treatment. Performed at [facility],  [address] [place], [place] [address]          Radiology Studies: [place] Ekg Site Rite  Result Date: 07/03/2018 If [facility] not attached, placement could not be confirmed due to current cardiac rhythm.       Scheduled Meds: . chlorhexidine  15 mL Mouth Rinse BID  . Chlorhexidine Gluconate Cloth  6 each Topical Daily  . Chlorhexidine Gluconate Cloth  6 each Topical Daily  . enoxaparin (LOVENOX) injection  40 mg Subcutaneous Q24H  . insulin aspart  0-5 Units Subcutaneous QHS  . insulin aspart  0-9 Units Subcutaneous TID WC  . insulin glargine  25 Units Subcutaneous Daily  . lisinopril  10 mg Oral Daily  . mouth rinse  15 mL Mouth Rinse q12n4p  .  metoprolol succinate  50 mg Oral Daily  . metroNIDAZOLE  500 mg Oral Q8H  . multivitamin with minerals  1 tablet Oral Daily  . nutrition supplement (JUVEN)  1 packet Oral BID BM  . ENSURE MAX PROTEIN  11 oz Oral BID  . rosuvastatin  20 mg Oral Daily  . sodium chloride flush  10-40 mL Intracatheter Q12H  . sodium chloride flush  10-40 mL Intracatheter Q12H   Continuous Infusions: . cefTRIAXone (ROCEPHIN)  IV Stopped (07/03/18 1406)     LOS: 9 days    Time spent:25 mins. More than 50% of that time was spent in counseling and/or coordination of care.      Burnadette PopAmrit Roy Tokarz, MD [facility] Pager [phone]  If 7PM-7AM, please contact night-coverage [email or web address] Password [email or web address] 07/04/2018, 1:17 PM

## 2018-07-04 NOTE — Progress Notes (Signed)
[facility] Progress Note  1 Day Post-Op  Subjective: CC-  Slept well last night. Pain well controlled at rest. Rectal tube came out at some point yesterday evening vs over night. No BM over night.  Objective: Vital signs in last 24 hours: Temp:  [97.9 F (36.6 C)-98.7 F (37.1 C)] 98 F (36.7 C) (03/05 0800) Pulse Rate:  [42-92] 83 (03/05 0800) Resp:  [11-24] 14 (03/05 0800) BP: (105-181)/(43-112) 111/43 (03/05 0800) SpO2:  [94 %-100 %] 99 % (03/05 0800) Weight:  [102.7 kg] 102.7 kg (03/04 1107) Last BM Date: 07/03/18  Intake/Output from previous day: 03/04 0701 - 03/05 0700 In: 500.1 [I.V.:400; IV Piggyback:100.1] Out: 525 [Urine:525] Intake/Output this shift: No intake/output data recorded.  PE: Gen: Alert, NAD, pleasant HEENT: EOM's intact, pupils equal and round Pulm:effort normal EK:CMKL slough noted but no significant purulent drainage or necrotic tissue       Lab Results:  Recent Labs    07/03/18 0638 07/04/18 0508  WBC 27.6* 30.4*  HGB 8.3* 8.2*  HCT 27.8* 27.7*  PLT 319 368   BMET Recent Labs    07/03/18 0638 07/04/18 0508  NA 134* 136  K 3.8 3.8  CL 103 104  CO2 27 27  GLUCOSE 160* 159*  BUN 13 12  CREATININE 0.52 0.59  CALCIUM 8.4* 8.4*   PT/INR No results for input(s): LABPROT, INR in the last 72 hours. CMP     Component Value Date/Time   NA 136 07/04/2018 0508   NA 142 10/02/2016 0856   NA 142 08/06/2014 0854   K 3.8 07/04/2018 0508   K 4.8 08/06/2014 0854   CL 104 07/04/2018 0508   CL 103 01/26/2012 1242   CO2 27 07/04/2018 0508   CO2 29 08/06/2014 0854   GLUCOSE 159 (H) 07/04/2018 0508   GLUCOSE 372 (H) 08/06/2014 0854   GLUCOSE 122 (H) 01/26/2012 1242   BUN 12 07/04/2018 0508   BUN 11 10/02/2016 0856   BUN 12.1 08/06/2014 0854   CREATININE 0.59 07/04/2018 0508   CREATININE 1.1 08/06/2014 0854   CALCIUM 8.4 (L) 07/04/2018 0508   CALCIUM 10.6 (H) 08/06/2014 0854   PROT 5.3 (L) 06/25/2018 1947   PROT 6.7 10/02/2016 0856   PROT 6.9 08/06/2014 0854   ALBUMIN 1.9 (L) 06/25/2018 1947   ALBUMIN 4.1 10/02/2016 0856   ALBUMIN 3.6 08/06/2014 0854   AST 44 (H) 06/25/2018 1947   AST 12 08/06/2014 0854   ALT 50 (H) 06/25/2018 1947   ALT 15 08/06/2014 0854   ALKPHOS 147 (H) 06/25/2018 1947   ALKPHOS 140 08/06/2014 0854   BILITOT 0.4 06/25/2018 1947   BILITOT 0.2 10/02/2016 0856   BILITOT 0.29 08/06/2014 0854   GFRNONAA >60 07/04/2018 0508   GFRAA >60 07/04/2018 0508   Lipase  No results found for: LIPASE     Studies/Results: Vas [place] Upper Extremity Venous Duplex  Result Date: 07/02/2018 UPPER VENOUS STUDY  Indications: rule out DVT Limitations: Central line on site; immobility of patient's right arm. Comparison Study: No comparison study available. Performing Technologist: Melodie Bouillon  Examination Guidelines: A complete evaluation includes B-mode imaging, spectral Doppler, color Doppler, and power Doppler as needed of all accessible portions of each vessel. Bilateral testing is considered an integral part of a complete examination. Limited examinations for reoccurring indications may be performed as noted.  Right Findings: +----------+------------+----------+---------+-----------+---------------------+ RIGHT     CompressiblePropertiesPhasicitySpontaneous       Summary        +----------+------------+----------+---------+-----------+---------------------+  IJV                                Yes       Yes     Not well visualized                                                      with compression due                                                       to central line on                                                               site          +----------+------------+----------+---------+-----------+---------------------+ Subclavian    Full                 Yes       Yes                           +----------+------------+----------+---------+-----------+---------------------+ Axillary      Full                 Yes       Yes                          +----------+------------+----------+---------+-----------+---------------------+ Brachial      Full                 Yes       Yes                          +----------+------------+----------+---------+-----------+---------------------+ Radial                                                 Not visualized     +----------+------------+----------+---------+-----------+---------------------+ Ulnar                                                  Not visualized     +----------+------------+----------+---------+-----------+---------------------+ Cephalic    Partial                                         Acute         +----------+------------+----------+---------+-----------+---------------------+ Basilic       Full                 Yes  Yes                          +----------+------------+----------+---------+-----------+---------------------+ Cephalic: distal to forearm unable to compress.  Left Findings: +----------+------------+----------+---------+-----------+-------+ LEFT      CompressiblePropertiesPhasicitySpontaneousSummary +----------+------------+----------+---------+-----------+-------+ Subclavian    Full                 Yes       Yes            +----------+------------+----------+---------+-----------+-------+  Summary:  Right: No evidence of thrombosis in the subclavian. Findings consistent with acute superficial vein thrombosis involving the right cephalic vein distal segment . This was a limited study due to patient cannot move her arm with tightedness position, distal segment of brachial veins unable to exam .  Left: No evidence of thrombosis in the subclavian.  *See table(s) above for measurements and observations.  Diagnosing physician: Waverly Ferrari MD Electronically signed by  Waverly Ferrari MD on 07/02/2018 at 3:55:16 PM.    Final    [place] Ekg Site Rite  Result Date: 07/03/2018 If Site Rite image not attached, placement could not be confirmed due to current cardiac rhythm.   Anti-infectives: Anti-infectives (From admission, onward)   Start     Dose/Rate Route Frequency Ordered Stop   06/28/18 1400  metroNIDAZOLE (FLAGYL) tablet 500 mg     500 mg Oral Every 8 hours 06/28/18 1225     06/26/18 2200  vancomycin (VANCOCIN) 1,500 mg in sodium chloride 0.9 % 500 mL IVPB  Status:  Discontinued     1,500 mg 250 mL/hr over 120 Minutes Intravenous Every 24 hours 06/26/18 0446 06/28/18 1225   06/26/18 0600  clindamycin (CLEOCIN) IVPB 600 mg  Status:  Discontinued     600 mg 100 mL/hr over 30 Minutes Intravenous Every 8 hours 06/26/18 0508 06/28/18 1225   06/26/18 0415  clindamycin (CLEOCIN) IVPB 300 mg  Status:  Discontinued     300 mg 100 mL/hr over 30 Minutes Intravenous Every 8 hours 06/26/18 0356 06/26/18 0508   06/26/18 0400  cefTRIAXone (ROCEPHIN) 2 g in sodium chloride 0.9 % 100 mL IVPB  Status:  Discontinued     2 g 200 mL/hr over 30 Minutes Intravenous Every 24 hours 06/26/18 0356 06/26/18 0409   06/25/18 2330  vancomycin (VANCOCIN) IVPB 1000 mg/200 mL premix     1,000 mg 200 mL/hr over 60 Minutes Intravenous  Once 06/25/18 1958 06/26/18 0015   06/25/18 1500  clindamycin (CLEOCIN) IVPB 600 mg     600 mg 100 mL/hr over 30 Minutes Intravenous  Once 06/25/18 1453 06/25/18 1657   06/25/18 1330  vancomycin (VANCOCIN) IVPB 1000 mg/200 mL premix     1,000 mg 200 mL/hr over 60 Minutes Intravenous  Once 06/25/18 1318 06/25/18 1606   06/25/18 1330  cefTRIAXone (ROCEPHIN) 2 g in sodium chloride 0.9 % 100 mL IVPB     2 g 200 mL/hr over 30 Minutes Intravenous Every 24 hours 06/25/18 1318         Assessment/Plan Uncontrolled DM- A1c 12.1 HTN HLD CAD H/o STEMI 2011 s/p RCA stent COPD Acute on chronic anemia  Urinary retention   Sepsis Necrotizing soft  tissue infection POD 6/4 S/p incision and drainage perirectal abscess 2/25 Dr. Maisie Fus S/p EXCISIONAL DEBRIDEMENT 2/27 Dr. Maisie Fus S/pEXCISIONAL DEBRIDEMENT, dressing change3/2 Dr. Dwain Sarna S/p excisional debridement, dressing change 3/4 Dr. Dwain Sarna - intraop cultures:RARE ESCHERICHIA COLI, resistant to cipro only - appreciate ID assistance with abx, considering adding  zyvox - BID wet to dry dressing change  ID -rocephin/flagyl2/25>>, clindamycin 2/25>>2/28, vancomycin 2/25>>2/28 VTE -SCDs,lovenox FEN -IVF, CM diet Foley - in place for retention Follow up -TBD  Plan: Dressing changed at bedside today. Continue BID wound care. Will plan to change dressing at bedside again tomorrow AM. Make NPO after midnight in case she needs to return to the OR for further debridement, depending how the wounds look in the morning.   LOS: 9 days    Franne Forts , [facility] 07/04/2018, 10:38 AM Pager: [phone] Mon-Thurs 7:00 am-4:30 pm Fri 7:00 am -11:30 AM Sat-Sun 7:00 am-11:30 am

## 2018-07-05 DIAGNOSIS — B9689 Other specified bacterial agents as the cause of diseases classified elsewhere: Secondary | ICD-10-CM

## 2018-07-05 LAB — CBC WITH DIFFERENTIAL/PLATELET
Abs Immature Granulocytes: 0.34 10*3/uL — ABNORMAL HIGH (ref 0.00–0.07)
Basophils Absolute: 0.1 10*3/uL (ref 0.0–0.1)
Basophils Relative: 0 %
Eosinophils Absolute: 0 10*3/uL (ref 0.0–0.5)
Eosinophils Relative: 0 %
HCT: 29 % — ABNORMAL LOW (ref 36.0–46.0)
HEMOGLOBIN: 8.4 g/dL — AB (ref 12.0–15.0)
Immature Granulocytes: 2 %
LYMPHS PCT: 13 %
Lymphs Abs: 2.8 10*3/uL (ref 0.7–4.0)
MCH: 25.1 pg — ABNORMAL LOW (ref 26.0–34.0)
MCHC: 29 g/dL — ABNORMAL LOW (ref 30.0–36.0)
MCV: 86.6 fL (ref 80.0–100.0)
Monocytes Absolute: 1.1 10*3/uL — ABNORMAL HIGH (ref 0.1–1.0)
Monocytes Relative: 5 %
Neutro Abs: 17.3 10*3/uL — ABNORMAL HIGH (ref 1.7–7.7)
Neutrophils Relative %: 80 %
Platelets: 407 10*3/uL — ABNORMAL HIGH (ref 150–400)
RBC: 3.35 MIL/uL — ABNORMAL LOW (ref 3.87–5.11)
RDW: 15.4 % (ref 11.5–15.5)
WBC: 21.6 10*3/uL — ABNORMAL HIGH (ref 4.0–10.5)
nRBC: 0.1 % (ref 0.0–0.2)

## 2018-07-05 LAB — GLUCOSE, CAPILLARY
GLUCOSE-CAPILLARY: 149 mg/dL — AB (ref 70–99)
GLUCOSE-CAPILLARY: 179 mg/dL — AB (ref 70–99)
Glucose-Capillary: 159 mg/dL — ABNORMAL HIGH (ref 70–99)
Glucose-Capillary: 152 mg/dL — ABNORMAL HIGH (ref 70–99)
Glucose-Capillary: 175 mg/dL — ABNORMAL HIGH (ref 70–99)

## 2018-07-05 NOTE — Progress Notes (Signed)
Physical Therapy Treatment Patient Details Name: Courtney [name] MRN: [id number] DOB: 28-Apr-1946 Today's Date: 07/05/2018    History of Present Illness 73 y.o. female admitted on 06/25/18 for boil on her left buttock with multiple recent falls.  Pt s/p I and D of fournier's gangrene in perirectal abcess 06/25/18 and repeat I and D  on 06/27/18.  Pt with significant PMH of RLS, MI, HTN, DM, COPD, CAD, and arthritis.  Repeat I&D performed 07/01/2018.    PT Comments    The patient presents with increased weakness. Patient deemed too weak to attempt standing efforts with STEDY. Continue PT for mobility and strengthening .  Follow Up Recommendations  Is patient a candidate for LTACH ?     Equipment Recommendations  None recommended by PT    Recommendations for Other Services       Precautions / Restrictions Precautions Precautions: Fall Precaution Comments: peri wounds, very weak, has potential for legs to buckle-use maxisky Restrictions Weight Bearing Restrictions: No    Mobility  Bed Mobility   Bed Mobility: Rolling;Sit to Sidelying Rolling: Mod assist Sidelying to sit: Max assist;+2 for physical assistance;+2 for safety/equipment Supine to sit: HOB elevated;Max assist;+2 for physical assistance Sit to supine: Total assist;+2 for physical assistance Sit to sidelying: Max assist;+2 for physical assistance;+2 for safety/equipment General bed mobility comments: assist with legs to roll to each side, able to self assist to reach for rail to roll. Assist with legs and trunk for sitting up at the bed edge. and to return to supine.  Transfers                 General transfer comment: [name] placed but patient too weak  to attempt as well as device too narrow for width of hips.   Ambulation/Gait                 Stairs             Wheelchair Mobility    Modified Rankin (Stroke Patients Only)       Balance Overall balance assessment: History of  Falls Sitting-balance support: Feet supported Sitting balance-Leahy Scale: Poor Sitting balance - Comments: while sitting and holding onto sara STEDy , patient asuddenly fell back onto bed. Assisted to sit up again and maintain balance.       Standing balance comment: unable to attempt standing with sara stedy.  Pt did not tolerate sitting EOB as well as previously.  Body does not fit comfortably in this lift                            Cognition Arousal/Alertness: Awake/alert Behavior During Therapy: WFL for tasks assessed/performed                                   General Comments: decreased attention today when sitting up.  Multimodal cues at times during bed mobility      Exercises Other Exercises Other Exercises: AAROM to R shoulder, FF and AROM to hands for edema management    General Comments        Pertinent Vitals/Pain Faces Pain Scale: Hurts even more Pain Location: right thigh Pain Descriptors / Indicators: Moaning;Grimacing Pain Intervention(s): Monitored during session    Home Living                      Prior  Function            PT Goals (current goals can now be found in the care plan section) Progress towards PT goals: Not progressing toward goals - comment(patient is  weaker this visit.)    Frequency    Min 2X/week      PT Plan Current plan remains appropriate;Discharge plan needs to be updated    Co-evaluation PT/OT/SLP Co-Evaluation/Treatment: Yes Reason for Co-Treatment: For patient/therapist safety;Complexity of the patient's impairments (multi-system involvement) PT goals addressed during session: Mobility/safety with mobility OT goals addressed during session: ADL's and self-care      AM-PAC PT "6 Clicks" Mobility   Outcome Measure  Help needed turning from your back to your side while in a flat bed without using bedrails?: Total Help needed moving from lying on your back to sitting on the side of  a flat bed without using bedrails?: Total Help needed moving to and from a bed to a chair (including a wheelchair)?: Total Help needed standing up from a chair using your arms (e.g., wheelchair or bedside chair)?: Total Help needed to walk in hospital room?: Total Help needed climbing 3-5 steps with a railing? : Total 6 Click Score: 6    End of Session   Activity Tolerance: Patient limited by fatigue Patient left: in bed;with call bell/phone within reach;with family/visitor present Nurse Communication: Mobility status PT Visit Diagnosis: Other abnormalities of gait and mobility (R26.89);Difficulty in walking, not elsewhere classified (R26.2);Muscle weakness (generalized) (M62.81);Pain Pain - Right/Left: Right Pain - part of body: Hip;Leg     Time: 1610-9604 PT Time Calculation (min) (ACUTE ONLY): 34 min  Charges:  $Therapeutic Activity: 8-22 mins                     Blanchard Kelch PT Acute Rehabilitation Services Pager [phone] Office [phone]    Rada Hay 07/05/2018, 3:18 PM

## 2018-07-05 NOTE — Progress Notes (Signed)
Occupational Therapy Treatment and goal update Patient Details Name: Courtney [name] MRN: [id number] DOB: January 16, 1946 Today's Date: 07/05/2018    History of present illness 73 y.o. female admitted on 06/25/18 for boil on her left buttock with multiple recent falls.  Pt s/p I and D of fournier's gangrene in perirectal abcess 06/25/18 and repeat I and D  on 06/27/18.  Pt with significant PMH of RLS, MI, HTN, DM, COPD, CAD, and arthritis.  Repeat I&D performed 07/01/2018.   OT comments  Sat eob and performed rolling/bed mobility today. Did not tolerate as well as last visit and c/o dizziness--could not get accurate BP.  Pt currently needs both UEs for support when sitting.  Goals downgraded today   Follow Up Recommendations  LTACH    Equipment Recommendations  None recommended by OT    Recommendations for Other Services      Precautions / Restrictions Precautions Precautions: Fall Precaution Comments: peri wounds, very weak, has potential for legs to buckle Restrictions Weight Bearing Restrictions: No       Mobility Bed Mobility   Bed Mobility: Rolling;Sit to Sidelying Rolling: Mod assist Sidelying to sit: Max assist;+2 for physical assistance;+2 for safety/equipment Supine to sit: HOB elevated;Max assist;+2 for physical assistance Sit to supine: Total assist;+2 for physical assistance Sit to sidelying: Max assist;+2 for physical assistance;+2 for safety/equipment General bed mobility comments: assist with legs to roll to each side, able to self assist to reach for rail to roll. Assist with legs and trunk for sitting up at the bed edge. and to return to supine.  Transfers                 General transfer comment: [name] placed but patient too weak  to attempt as well as device too narrow for width of hips.     Balance Overall balance assessment: History of Falls Sitting-balance support: Feet supported Sitting balance-Leahy Scale: Poor Sitting balance - Comments: while  sitting and holding onto [facility] , patient suddenly fell back onto bed. Assisted to sit up again and maintain balance.       Standing balance comment: unable to attempt standing with sara stedy.  Pt did not tolerate sitting EOB as well as previously.  Body does not fit comfortably in this lift                           ADL either performed or assessed with clinical judgement   ADL                                         General ADL Comments: needs bil UE support for sitting balance sitting EOB     Vision       Perception     Praxis      Cognition Arousal/Alertness: Awake/alert Behavior During Therapy: WFL for tasks assessed/performed                                   General Comments: decreased attention today when sitting up.  Multimodal cues at times during bed mobility        Exercises Other Exercises Other Exercises: AAROM to R shoulder, FF and AROM to hands for edema management   Shoulder Instructions       General Comments  Pertinent Vitals/ Pain       Faces Pain Scale: Hurts even more Pain Location: right thigh Pain Descriptors / Indicators: Moaning;Grimacing Pain Intervention(s): Monitored during session  Home Living                                          Prior Functioning/Environment              Frequency  Min 2X/week        Progress Toward Goals  OT Goals(current goals can now be found in the care plan section)  Progress towards OT goals: Not progressing toward goals - comment;Goals drowngraded-see care plan  Acute Rehab OT Goals Time For Goal Achievement: 07/19/18 ADL Goals Pt Will Transfer to Toilet: (discontinue) Additional ADL Goal #1: pt will sit eob x 5 minutes with min  guard assist, with only one UE used for support in preparation for seated adls Additional ADL Goal #2: will will go from sit to stand with mod A +2 with EVA platform, sara plus lift or other  AD for adls Additional ADL Goal #3: pt will perform LUE level one theraband program and RUE elbow with min cues  Plan      Co-evaluation    PT/OT/SLP Co-Evaluation/Treatment: Yes Reason for Co-Treatment: For patient/therapist safety;Complexity of the patient's impairments (multi-system involvement) PT goals addressed during session: Mobility/safety with mobility OT goals addressed during session: ADL's and self-care      AM-PAC OT "6 Clicks" Daily Activity     Outcome Measure   Help from another person eating meals?: A Little Help from another person taking care of personal grooming?: A Little Help from another person toileting, which includes using toliet, bedpan, or urinal?: Total Help from another person bathing (including washing, rinsing, drying)?: A Lot Help from another person to put on and taking off regular upper body clothing?: A Lot Help from another person to put on and taking off regular lower body clothing?: Total 6 Click Score: 12    End of Session    OT Visit Diagnosis: History of falling (Z91.81);Muscle weakness (generalized) (M62.81)   Activity Tolerance Patient limited by fatigue   Patient Left in bed;with call bell/phone within reach;with bed alarm set;with family/visitor present   Nurse Communication          Time: 2951-8841 OT Time Calculation (min): 28 min  Charges: OT General Charges $OT Visit: 1 Visit OT Treatments $Therapeutic Activity: 8-22 mins  Courtney [name], OTR/L Acute Rehabilitation Services [phone] [facility] pager [phone] office 07/05/2018   Courtney [name] 07/05/2018, 3:12 PM

## 2018-07-05 NOTE — Progress Notes (Signed)
PROGRESS NOTE    Courtney [name]  DXA:[id number] DOB: [birth year] DOA: 06/25/2018 PCP: Jordan Hawks, PA-C   Brief Narrative: Patient is a 73 year old female with history of uncontrolled type diabetes mellitus, coronary artery disease, OSA on CPAP, asthma, COPD who presents to the emergency department with complaints of generalized weakness, gluteal abscess for several days.  She was found to be septic with [place] gangrene/NF and was taken emergently to the OR.  She has underwent  multiple debridements  by general surgery Courtney far.ID also consulted for the guidance for antibiotics.  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  Currently on ceftriaxone and flagyl.  Negative blood cultures.  Courtney far showed Ecoli and anareobes on wound cultures.  Wound care as per general surgery. ID consultation done  for recommendation on antibiotics.  Still has significant leukocytosis.  Afebrile. Surgery is not planning for further debridement.  Continue daily dressing changes.  Surgery consulting  wound care. [facility]  We will discharge her there after surgical clearance.  Stage II pressure ulcers of the right and left medial sacrum/right groin: Present on admission.  Wound care as per general surgery.  Optimize nutritional status.  Uncontrolled type 2 diabetes mellitus: Hemoglobin A1c of 12.1.  Follows with endocrinology.  Continue Lantus and sliding scale  insulin for now.  Diabetic coordinator  following.  COPD/asthma: Not on any home medications.   Currently COPD is stable.  We will put her on albuterol inhaler on discharge.  Follow-up with pulmonology as an outpatient.  OSA: Continue CPAP at night.  Coronary artery disease: Continue metoprolol, statin.  Not on aspirin at home.  Hypertension: Continue metoprolol at increased dose .  Added lisinopril.PRN meds.  BP fluctuating.  Hyperlipidemia: Continue statin  Obesity: BMI of 36.  Dietitian following.  Deconditioning/debility: Physical therapy evaluated her and recommended skilled services on discharge.  She has a bed offer at [facility]. Will discharge her there after surgical clearance.  Nutrition Problem: Increased nutrient needs Etiology: wound healing, acute illness, post-op healing      DVT prophylaxis:Lovenox Code Status: Full Family Communication: None present at the bedside Disposition Plan: [facility] after surgical clearance   Consultants: General surgery,ID  Procedures:Multiple  I&D/debridgements   Antimicrobials:  Anti-infectives (From admission, onward)   Start     Dose/Rate Route Frequency Ordered Stop   06/28/18 1400  metroNIDAZOLE (FLAGYL) tablet 500 mg     500 mg Oral Every 8 hours 06/28/18 1225     06/26/18 2200  vancomycin (VANCOCIN) 1,500 mg in sodium chloride 0.9 % 500 mL IVPB  Status:  Discontinued     1,500 mg 250 mL/hr over 120 Minutes Intravenous Every 24 hours 06/26/18 0446 06/28/18 1225   06/26/18 0600  clindamycin (CLEOCIN) IVPB 600 mg  Status:  Discontinued     600 mg 100 mL/hr over 30 Minutes Intravenous Every 8 hours 06/26/18 0508 06/28/18 1225   06/26/18 0415  clindamycin (CLEOCIN) IVPB 300 mg  Status:  Discontinued     300 mg 100  mL/hr over 30 Minutes Intravenous Every 8 hours 06/26/18 0356 06/26/18 0508   06/26/18 0400  cefTRIAXone (ROCEPHIN) 2 g in sodium chloride 0.9 % 100 mL IVPB  Status:  Discontinued     2 g 200 mL/hr over 30 Minutes Intravenous Every 24 hours 06/26/18 0356 06/26/18 0409   06/25/18 2330  vancomycin (VANCOCIN) IVPB  1000 mg/200 mL premix     1,000 mg 200 mL/hr over 60 Minutes Intravenous  Once 06/25/18 1958 06/26/18 0015   06/25/18 1500  clindamycin (CLEOCIN) IVPB 600 mg     600 mg 100 mL/hr over 30 Minutes Intravenous  Once 06/25/18 1453 06/25/18 1657   06/25/18 1330  vancomycin (VANCOCIN) IVPB 1000 mg/200 mL premix     1,000 mg 200 mL/hr over 60 Minutes Intravenous  Once 06/25/18 1318 06/25/18 1606   06/25/18 1330  cefTRIAXone (ROCEPHIN) 2 g in sodium chloride 0.9 % 100 mL IVPB     2 g 200 mL/hr over 30 Minutes Intravenous Every 24 hours 06/25/18 1318        Subjective: Patient seen and examined the bedside this morning.  Hemodynamically stable.  Comfortable.  Denies any pain.  Objective: Vitals:   07/04/18 2300 07/05/18 0000 07/05/18 0335 07/05/18 1019  BP:    (!) 155/66  Pulse: 87   93  Resp: (!) 22     Temp:  98.5 F (36.9 C) 98.4 F (36.9 C)   TempSrc:  Oral Oral   SpO2: 100%     Weight:      Height:        Intake/Output Summary (Last 24 hours) at 07/05/2018 1116 Last data filed at 07/05/2018 1019 Gross per 24 hour  Intake 507.72 ml  Output 445 ml  Net 62.72 ml     Examination:   General exam: Appears calm and comfortable ,Not in distress,obese HEENT:PERRL,Oral mucosa moist, Ear/Nose normal on gross exam Respiratory system: Bilateral equal air entry, normal vesicular breath sounds, no wheezes or crackles  Cardiovascular system: S1 & S2 heard, RRR. No JVD, murmurs, rubs, gallops or clicks. Gastrointestinal system: Abdomen is nondistended, soft and nontender. No organomegaly or masses felt. Normal bowel sounds heard. Central nervous system: Alert and oriented. No focal neurological deficits. Extremities: No edema, no clubbing ,no cyanosis, distal peripheral pulses palpable. Skin/GU: Perineal wound wrapped with dressings   Data Reviewed: I have personally reviewed following labs and imaging studies  CBC: Recent Labs  Lab 06/30/18 0610 07/01/18 0458 07/02/18 0500  07/03/18 0638 07/04/18 0508 07/05/18 0500  WBC 24.4* 27.5* 30.0* 27.6* 30.4* 21.6*  NEUTROABS 19.8* 22.0* 25.1* 22.3*  --  17.3*  HGB 9.2* 9.3* 8.6* 8.3* 8.2* 8.4*  HCT 31.7* 30.9* 29.4* 27.8* 27.7* 29.0*  MCV 84.1 82.6 83.1 83.5 84.2 86.6  PLT 269 266 285 319 368 407*   Basic Metabolic Panel: Recent Labs  Lab 06/29/18 0453 07/01/18 0458 07/03/18 0019 07/03/18 0638 07/04/18 0508  NA 138 135 134* 134* 136  K 4.1 3.8 3.9 3.8 3.8  CL 110 103 103 103 104  CO2 GLUCOSE 152* 166* 233* 160* 159*  BUN CREATININE 0.50 0.51 0.63 0.52 0.59  CALCIUM 8.1* 8.5* 8.1* 8.4* 8.4*  MG  --   --  1.7  --  2.1   GFR: Estimated Creatinine Clearance: 70.6 mL/min (by C-G formula based on SCr of 0.59 mg/dL). Liver Function Tests: No results for input(s): AST, ALT, ALKPHOS, BILITOT, PROT, ALBUMIN in  the last 168 hours. No results for input(s): LIPASE, AMYLASE in the last 168 hours. No results for input(s): AMMONIA in the last 168 hours. Coagulation Profile: No results for input(s): INR, PROTIME in the last 168 hours. Cardiac Enzymes: No results for input(s): CKTOTAL, CKMB, CKMBINDEX, TROPONINI in the last 168 hours. BNP (last 3 results) No results for input(s): PROBNP in the last 8760 hours. HbA1C: No results for input(s): HGBA1C in the last 72 hours. CBG: Recent Labs  Lab 07/04/18 1150 07/04/18 1642 07/04/18 2055 07/05/18 0743 07/05/18 1010  GLUCAP 199* 206* 275* 159* 152*   Lipid Profile: No results for input(s): CHOL, HDL, LDLCALC, TRIG, CHOLHDL, LDLDIRECT in the last 72 hours. Thyroid Function Tests: No results for input(s): TSH, T4TOTAL, FREET4, T3FREE, THYROIDAB in the last 72 hours. Anemia Panel: No results for input(s): VITAMINB12, FOLATE, FERRITIN, TIBC, IRON, RETICCTPCT in the last 72 hours. Sepsis Labs: No results for input(s): PROCALCITON, LATICACIDVEN in the last 168 hours.  Recent Results (from the past 240 hour(s))  Culture, blood  (routine x 2)     Status: None   Collection Time: 06/25/18 12:10 PM  Result Value Ref Range Status   Specimen Description   Final    BLOOD LEFT ANTECUBITAL Performed at [facility], [address], [place], [place] [address]    Special Requests   Final    BOTTLES DRAWN AEROBIC AND ANAEROBIC Blood Culture adequate volume Performed at [facility], [address], [place], [place] [address]    Culture   Final    NO GROWTH 5 DAYS Performed at [facility], [address], [place], [place] [address]    Report Status 06/30/2018 FINAL  Final  Culture, blood (routine x 2)     Status: None   Collection Time: 06/25/18  2:06 PM  Result Value Ref Range Status   Specimen Description   Final    BLOOD LEFT ANTECUBITAL Performed at [facility], [address], [place], [place] [address]    Special Requests   Final    BOTTLES DRAWN AEROBIC AND ANAEROBIC Blood Culture adequate volume Performed at [facility], [address] [place], [place] [address]    Culture   Final    NO GROWTH 5 DAYS Performed at [facility], [address], [place], [place] [address]    Report Status 06/30/2018 FINAL  Final  Aerobic/Anaerobic Culture (surgical/deep wound)     Status: None   Collection Time: 06/25/18  5:18 PM  Result Value Ref Range Status   Specimen Description   Final    ABSCESS Performed at [facility], [address], [place], [place] [address]    Special Requests RECTAL  Final   Gram Stain   Final    FEW WBC PRESENT, PREDOMINANTLY PMN ABUNDANT GRAM NEGATIVE RODS ABUNDANT GRAM POSITIVE COCCI Performed at [facility], [address] [place], [place] [address]    Culture   Final    RARE ESCHERICHIA COLI MIXED ANAEROBIC FLORA PRESENT.  CALL LAB IF FURTHER IID REQUIRED.    Report Status 06/29/2018 FINAL  Final   Organism ID, Bacteria ESCHERICHIA COLI  Final       Susceptibility   Escherichia coli - MIC*    AMPICILLIN 16 INTERMEDIATE Intermediate     CEFAZOLIN <=4 SENSITIVE Sensitive     CEFEPIME <=1 SENSITIVE Sensitive     CEFTAZIDIME <=1 SENSITIVE Sensitive     CEFTRIAXONE <=  1 SENSITIVE Sensitive     CIPROFLOXACIN >=4 RESISTANT Resistant     GENTAMICIN <=1 SENSITIVE Sensitive     IMIPENEM <=0.25 SENSITIVE Sensitive     TRIMETH/SULFA <=20 SENSITIVE Sensitive     AMPICILLIN/SULBACTAM 4 SENSITIVE Sensitive     PIP/TAZO <=4 SENSITIVE Sensitive     Extended ESBL NEGATIVE Sensitive     * RARE ESCHERICHIA COLI  Culture, blood (x 2)     Status: None   Collection Time: 06/25/18  7:52 PM  Result Value Ref Range Status   Specimen Description   Final    BLOOD ARTERIAL LINE Performed at [facility], [address] [place], [place] [address]    Special Requests   Final    BOTTLES DRAWN AEROBIC AND ANAEROBIC Blood Culture adequate volume Performed at [facility], [address], [place], [place] [address]    Culture   Final    NO GROWTH 5 DAYS Performed at [facility], [address], [place], [place] [address]    Report Status 06/30/2018 FINAL  Final  Culture, blood (x 2)     Status: None   Collection Time: 06/25/18  9:34 PM  Result Value Ref Range Status   Specimen Description BLOOD LEFT ARM  Final   Special Requests   Final    BOTTLES DRAWN AEROBIC ONLY Blood Culture results may not be optimal due to an inadequate volume of blood received in culture bottles   Culture   Final    NO GROWTH 5 DAYS Performed at [facility], [address], [place], [place] [address]    Report Status 06/30/2018 FINAL  Final  MRSA PCR Screening     Status: None   Collection Time: 06/25/18 10:19 PM  Result Value Ref Range Status   MRSA by PCR NEGATIVE NEGATIVE Final    Comment:        The GeneXpert MRSA Assay (FDA approved for NASAL specimens only), is one component of a comprehensive MRSA  colonization surveillance program. It is not intended to diagnose MRSA infection nor to guide or monitor treatment for MRSA infections. Performed at [facility], [address], [place], [place] [address]   Urine culture     Status: None   Collection Time: 06/26/18  1:35 PM  Result Value Ref Range Status   Specimen Description   Final    URINE, RANDOM Performed at [facility], [address], [place], [place] [address]    Special Requests   Final    Normal Performed at [facility], [address] [place], [place] [address]    Culture   Final    NO GROWTH Performed at [facility], [address] [place], [place] [address]    Report Status 06/27/2018 FINAL  Final  Surgical PCR screen     Status: None   Collection Time: 07/03/18 10:13 AM  Result Value Ref Range Status   MRSA, PCR NEGATIVE NEGATIVE Final   Staphylococcus aureus NEGATIVE NEGATIVE Final    Comment: (NOTE) The Xpert SA Assay (FDA approved for NASAL specimens in patients 65 years of age and older), is one component of a comprehensive surveillance program. It is not intended to diagnose infection nor to guide or monitor treatment. Performed at [facility], [address] [place], [place] [address]          Radiology Studies: [place] [facility]  Result Date:  07/03/2018 If Site Rite image not attached, placement could not be confirmed due to current cardiac rhythm.       Scheduled Meds: . chlorhexidine  15 mL Mouth Rinse BID  . Chlorhexidine Gluconate Cloth  6 each Topical Daily  . Chlorhexidine Gluconate Cloth  6 each Topical Daily  . enoxaparin (LOVENOX) injection  40 mg Subcutaneous Q24H  . insulin aspart  0-5 Units Subcutaneous QHS  . insulin aspart  0-9 Units Subcutaneous TID WC  . insulin glargine  25 Units Subcutaneous Daily  . lisinopril  10 mg Oral Daily  . mouth rinse  15 mL Mouth Rinse q12n4p  .  metoprolol succinate  50 mg Oral Daily  . metroNIDAZOLE  500 mg Oral Q8H  . multivitamin with minerals  1 tablet Oral Daily  . nutrition supplement (JUVEN)  1 packet Oral BID BM  . Ensure Max Protein  11 oz Oral BID  . rosuvastatin  20 mg Oral Daily  . sodium chloride flush  10-40 mL Intracatheter Q12H  . sodium chloride flush  10-40 mL Intracatheter Q12H   Continuous Infusions: . sodium chloride Stopped (07/04/18 1355)  . cefTRIAXone (ROCEPHIN)  IV Stopped (07/04/18 1425)     LOS: 10 days    Time spent:25 mins. More than 50% of that time was spent in counseling and/or coordination of care.      Burnadette Pop, MD [facility] Pager [phone]  If 7PM-7AM, please contact night-coverage [email or web address] Password [email or web address] 07/05/2018, 11:16 AM

## 2018-07-05 NOTE — Progress Notes (Signed)
[facility] Progress Note  2 Days Post-Op  Subjective: CC-  No complaints this morning. WBC trending down.  Tolerating diet. Denies abdominal pain, nausea, vomiting. No BM yesterday.  Objective: Vital signs in last 24 hours: Temp:  [98.2 F (36.8 C)-98.6 F (37 C)] 98.4 F (36.9 C) (03/06 0335) Pulse Rate:  [79-87] 87 (03/05 2300) Resp:  [13-22] 22 (03/05 2300) BP: (111-131)/(49-59) 115/49 (03/05 2000) SpO2:  [99 %-100 %] 100 % (03/05 2300) Last BM Date: 07/03/18  Intake/Output from previous day: 03/05 0701 - 03/06 0700 In: 857.7 [P.O.:760; I.V.:0.2; IV Piggyback:97.5] Out: 745 [Urine:745] Intake/Output this shift: No intake/output data recorded.  PE: Gen: Alert, NAD, pleasant HEENT: EOM's intact, pupils equal and round Pulm:effort normal EY:CXKG slough noted but no significant purulent drainage or necrotic tissue       Lab Results:  Recent Labs    07/04/18 0508 07/05/18 0500  WBC 30.4* 21.6*  HGB 8.2* 8.4*  HCT 27.7* 29.0*  PLT 368 407*   BMET Recent Labs    07/03/18 0638 07/04/18 0508  NA 134* 136  K 3.8 3.8  CL 103 104  CO2 27 27  GLUCOSE 160* 159*  BUN 13 12  CREATININE 0.52 0.59  CALCIUM 8.4* 8.4*   PT/INR No results for input(s): LABPROT, INR in the last 72 hours. CMP     Component Value Date/Time   NA 136 07/04/2018 0508   NA 142 10/02/2016 0856   NA 142 08/06/2014 0854   K 3.8 07/04/2018 0508   K 4.8 08/06/2014 0854   CL 104 07/04/2018 0508   CL 103 01/26/2012 1242   CO2 27 07/04/2018 0508   CO2 29 08/06/2014 0854   GLUCOSE 159 (H) 07/04/2018 0508   GLUCOSE 372 (H) 08/06/2014 0854   GLUCOSE 122 (H) 01/26/2012 1242   BUN 12 07/04/2018 0508   BUN 11 10/02/2016 0856   BUN 12.1 08/06/2014 0854   CREATININE 0.59 07/04/2018 0508   CREATININE 1.1 08/06/2014 0854   CALCIUM 8.4 (L) 07/04/2018 0508   CALCIUM 10.6 (H) 08/06/2014 0854   PROT 5.3 (L) 06/25/2018 1947   PROT 6.7 10/02/2016 0856   PROT 6.9 08/06/2014  0854   ALBUMIN 1.9 (L) 06/25/2018 1947   ALBUMIN 4.1 10/02/2016 0856   ALBUMIN 3.6 08/06/2014 0854   AST 44 (H) 06/25/2018 1947   AST 12 08/06/2014 0854   ALT 50 (H) 06/25/2018 1947   ALT 15 08/06/2014 0854   ALKPHOS 147 (H) 06/25/2018 1947   ALKPHOS 140 08/06/2014 0854   BILITOT 0.4 06/25/2018 1947   BILITOT 0.2 10/02/2016 0856   BILITOT 0.29 08/06/2014 0854   GFRNONAA >60 07/04/2018 0508   GFRAA >60 07/04/2018 0508   Lipase  No results found for: LIPASE     Studies/Results: [place] Ekg Site Rite  Result Date: 07/03/2018 If Site Rite image not attached, placement could not be confirmed due to current cardiac rhythm.   Anti-infectives: Anti-infectives (From admission, onward)   Start     Dose/Rate Route Frequency Ordered Stop   06/28/18 1400  metroNIDAZOLE (FLAGYL) tablet 500 mg     500 mg Oral Every 8 hours 06/28/18 1225     06/26/18 2200  vancomycin (VANCOCIN) 1,500 mg in sodium chloride 0.9 % 500 mL IVPB  Status:  Discontinued     1,500 mg 250 mL/hr over 120 Minutes Intravenous Every 24 hours 06/26/18 0446 06/28/18 1225   06/26/18 0600  clindamycin (CLEOCIN) IVPB 600 mg  Status:  Discontinued  600 mg 100 mL/hr over 30 Minutes Intravenous Every 8 hours 06/26/18 0508 06/28/18 1225   06/26/18 0415  clindamycin (CLEOCIN) IVPB 300 mg  Status:  Discontinued     300 mg 100 mL/hr over 30 Minutes Intravenous Every 8 hours 06/26/18 0356 06/26/18 0508   06/26/18 0400  cefTRIAXone (ROCEPHIN) 2 g in sodium chloride 0.9 % 100 mL IVPB  Status:  Discontinued     2 g 200 mL/hr over 30 Minutes Intravenous Every 24 hours 06/26/18 0356 06/26/18 0409   06/25/18 2330  vancomycin (VANCOCIN) IVPB 1000 mg/200 mL premix     1,000 mg 200 mL/hr over 60 Minutes Intravenous  Once 06/25/18 1958 06/26/18 0015   06/25/18 1500  clindamycin (CLEOCIN) IVPB 600 mg     600 mg 100 mL/hr over 30 Minutes Intravenous  Once 06/25/18 1453 06/25/18 1657   06/25/18 1330  vancomycin (VANCOCIN) IVPB 1000 mg/200  mL premix     1,000 mg 200 mL/hr over 60 Minutes Intravenous  Once 06/25/18 1318 06/25/18 1606   06/25/18 1330  cefTRIAXone (ROCEPHIN) 2 g in sodium chloride 0.9 % 100 mL IVPB     2 g 200 mL/hr over 30 Minutes Intravenous Every 24 hours 06/25/18 1318         Assessment/Plan Uncontrolled DM- A1c 12.1 HTN HLD CAD H/o STEMI 2011 s/p RCA stent COPD Acute on chronic anemia  Urinary retention   Sepsis Necrotizing soft tissue infection POD 6/4 S/p incision and drainage perirectal abscess 2/25 Dr. Maisie Fus S/p EXCISIONAL DEBRIDEMENT 2/27 Dr. Maisie Fus S/pEXCISIONAL DEBRIDEMENT, dressing change3/2 Dr. Dwain Sarna S/p excisional debridement,dressing change 3/4 Dr. Dwain Sarna - intraop cultures:RARE ESCHERICHIA COLI, resistant to cipro only - appreciate ID assistance with abx - BID wet to dry dressing change  ID -rocephin/flagyl2/25>>, clindamycin 2/25>>2/28, vancomycin 2/25>>2/28 VTE -SCDs,lovenox FEN -IVF,CM diet Foley - in place for retention Follow up -TBD  Plan:Would healing appropriately, would not recommend any further surgical intervention at this time. Continue BID wet to dry dressing changes.   LOS: 10 days    Franne Forts , [facility] Surgery 07/05/2018, 8:23 AM Pager: [phone] Mon-Thurs 7:00 am-4:30 pm Fri 7:00 am -11:30 AM Sat-Sun 7:00 am-11:30 am

## 2018-07-05 NOTE — Progress Notes (Signed)
INFECTIOUS DISEASE PROGRESS NOTE  ID: Courtney [name] is a 73 y.o. 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 Active Problems:   HTN (hypertension)   Normocytic anemia   HLD (hyperlipidemia)   Restless leg syndrome   COPD (chronic obstructive pulmonary disease) (HCC)   CAD (coronary artery disease)   OSA and COPD overlap syndrome (HCC)   Pressure injury of skin   DM (diabetes mellitus) (HCC)   Cigarette smoker   Sepsis (HCC)  Subjective: No complaints.   Abtx:  Anti-infectives (From admission, onward)   Start     Dose/Rate Route Frequency Ordered Stop   06/28/18 1400  metroNIDAZOLE (FLAGYL) tablet 500 mg     500 mg Oral Every 8 hours 06/28/18 1225     06/26/18 2200  vancomycin (VANCOCIN) 1,500 mg in sodium chloride 0.9 % 500 mL IVPB  Status:  Discontinued     1,500 mg 250 mL/hr over 120 Minutes Intravenous Every 24 hours 06/26/18 0446 06/28/18 1225   06/26/18 0600  clindamycin (CLEOCIN) IVPB 600 mg  Status:  Discontinued     600 mg 100 mL/hr over 30 Minutes Intravenous Every 8 hours 06/26/18 0508 06/28/18 1225   06/26/18 0415  clindamycin (CLEOCIN) IVPB 300 mg  Status:  Discontinued     300 mg 100 mL/hr over 30 Minutes Intravenous Every 8 hours 06/26/18 0356 06/26/18 0508   06/26/18 0400  cefTRIAXone (ROCEPHIN) 2 g in sodium chloride 0.9 % 100 mL IVPB  Status:  Discontinued     2 g 200 mL/hr over 30 Minutes Intravenous Every 24 hours 06/26/18 0356 06/26/18 0409   06/25/18 2330  vancomycin (VANCOCIN) IVPB 1000 mg/200 mL premix     1,000 mg 200 mL/hr over 60 Minutes Intravenous  Once 06/25/18 1958 06/26/18 0015   06/25/18 1500  clindamycin (CLEOCIN) IVPB 600 mg     600 mg 100 mL/hr over 30 Minutes Intravenous  Once 06/25/18 1453 06/25/18 1657   06/25/18 1330  vancomycin (VANCOCIN) IVPB 1000 mg/200 mL premix     1,000 mg 200 mL/hr over 60 Minutes Intravenous  Once 06/25/18 1318 06/25/18 1606   06/25/18 1330  cefTRIAXone (ROCEPHIN) 2 g in sodium  chloride 0.9 % 100 mL IVPB     2 g 200 mL/hr over 30 Minutes Intravenous Every 24 hours 06/25/18 1318        Medications:  Scheduled: . chlorhexidine  15 mL Mouth Rinse BID  . Chlorhexidine Gluconate Cloth  6 each Topical Daily  . Chlorhexidine Gluconate Cloth  6 each Topical Daily  . enoxaparin (LOVENOX) injection  40 mg Subcutaneous Q24H  . insulin aspart  0-5 Units Subcutaneous QHS  . insulin aspart  0-9 Units Subcutaneous TID WC  . insulin glargine  25 Units Subcutaneous Daily  . lisinopril  10 mg Oral Daily  . mouth rinse  15 mL Mouth Rinse q12n4p  . metoprolol succinate  50 mg Oral Daily  . metroNIDAZOLE  500 mg Oral Q8H  . multivitamin with minerals  1 tablet Oral Daily  . nutrition supplement (JUVEN)  1 packet Oral BID BM  . Ensure Max Protein  11 oz Oral BID  . rosuvastatin  20 mg Oral Daily  . sodium chloride flush  10-40 mL Intracatheter Q12H  . sodium chloride flush  10-40 mL Intracatheter Q12H    Objective: Vital signs in last 24 hours: Temp:  [98.2 F (36.8 C)-98.6 F (37 C)] 98.4 F (36.9 C) (03/06 0335) Pulse Rate:  [  79-87] 87 (03/05 2300) Resp:  [13-22] 22 (03/05 2300) BP: (111-131)/(49-59) 115/49 (03/05 2000) SpO2:  [99 %-100 %] 100 % (03/05 2300)   General appearance: alert, cooperative and no distress Resp: clear to auscultation bilaterally Cardio: regular rate and rhythm GI: normal findings: bowel sounds normal and soft, non-tender Extremities: anasarca  Lab Results Recent Labs    07/03/18 0638 07/04/18 0508 07/05/18 0500  WBC 27.6* 30.4* 21.6*  HGB 8.3* 8.2* 8.4*  HCT 27.8* 27.7* 29.0*  NA 134* 136  --   K 3.8 3.8  --   CL 103 104  --   CO2 27 27  --   BUN 13 12  --   CREATININE 0.52 0.59  --    Liver Panel No results for input(s): PROT, ALBUMIN, AST, ALT, ALKPHOS, BILITOT, BILIDIR, IBILI in the last 72 hours. Sedimentation Rate No results for input(s): ESRSEDRATE in the last 72 hours. C-Reactive Protein No results for input(s):  CRP in the last 72 hours.  Microbiology: Recent Results (from the past 240 hour(s))  Culture, blood (routine x 2)     Status: None   Collection Time: 06/25/18 12:10 PM  Result Value Ref Range Status   Specimen Description   Final    BLOOD LEFT ANTECUBITAL Performed at [facility], [address], [place], [place] [address]    Special Requests   Final    BOTTLES DRAWN AEROBIC AND ANAEROBIC Blood Culture adequate volume Performed at [facility], [address], [place], [place] [address]    Culture   Final    NO GROWTH 5 DAYS Performed at [facility], [address], [place], [place] [address]    Report Status 06/30/2018 FINAL  Final  Culture, blood (routine x 2)     Status: None   Collection Time: 06/25/18  2:06 PM  Result Value Ref Range Status   Specimen Description   Final    BLOOD LEFT ANTECUBITAL Performed at [facility], [address], [place], [place] [address]    Special Requests   Final    BOTTLES DRAWN AEROBIC AND ANAEROBIC Blood Culture adequate volume Performed at [facility], [address] [place], [place] [address]    Culture   Final    NO GROWTH 5 DAYS Performed at [facility], [address], [place], [place] [address]    Report Status 06/30/2018 FINAL  Final  Aerobic/Anaerobic Culture (surgical/deep wound)     Status: None   Collection Time: 06/25/18  5:18 PM  Result Value Ref Range Status   Specimen Description   Final    ABSCESS Performed at [facility], [address] [place], [place] [address]    Special Requests RECTAL  Final   Gram Stain   Final    FEW WBC PRESENT, PREDOMINANTLY PMN ABUNDANT GRAM NEGATIVE RODS ABUNDANT GRAM POSITIVE COCCI Performed at [facility], [address], [place], [place] [address]    Culture   Final    RARE ESCHERICHIA COLI MIXED ANAEROBIC FLORA PRESENT.  CALL LAB IF FURTHER IID REQUIRED.     Report Status 06/29/2018 FINAL  Final   Organism ID, Bacteria ESCHERICHIA COLI  Final      Susceptibility   Escherichia coli - MIC*    AMPICILLIN 16 INTERMEDIATE Intermediate     CEFAZOLIN <=4 SENSITIVE Sensitive     CEFEPIME <=1 SENSITIVE Sensitive     CEFTAZIDIME <=1 SENSITIVE Sensitive  CEFTRIAXONE <=1 SENSITIVE Sensitive     CIPROFLOXACIN >=4 RESISTANT Resistant     GENTAMICIN <=1 SENSITIVE Sensitive     IMIPENEM <=0.25 SENSITIVE Sensitive     TRIMETH/SULFA <=20 SENSITIVE Sensitive     AMPICILLIN/SULBACTAM 4 SENSITIVE Sensitive     PIP/TAZO <=4 SENSITIVE Sensitive     Extended ESBL NEGATIVE Sensitive     * RARE ESCHERICHIA COLI  Culture, blood (x 2)     Status: None   Collection Time: 06/25/18  7:52 PM  Result Value Ref Range Status   Specimen Description   Final    BLOOD ARTERIAL LINE Performed at [facility], [address] [place], [place] [address]    Special Requests   Final    BOTTLES DRAWN AEROBIC AND ANAEROBIC Blood Culture adequate volume Performed at [facility], [address] [place], [place] [address]    Culture   Final    NO GROWTH 5 DAYS Performed at [facility], [address] [place], [place] [address]    Report Status 06/30/2018 FINAL  Final  Culture, blood (x 2)     Status: None   Collection Time: 06/25/18  9:34 PM  Result Value Ref Range Status   Specimen Description BLOOD LEFT ARM  Final   Special Requests   Final    BOTTLES DRAWN AEROBIC ONLY Blood Culture results may not be optimal due to an inadequate volume of blood received in culture bottles   Culture   Final    NO GROWTH 5 DAYS Performed at [facility], [address] [place], [place] [address]    Report Status 06/30/2018 FINAL  Final  MRSA PCR Screening     Status: None   Collection Time: 06/25/18 10:19 PM  Result Value Ref Range Status   MRSA by PCR NEGATIVE NEGATIVE Final    Comment:        The GeneXpert MRSA Assay  (FDA approved for NASAL specimens only), is one component of a comprehensive MRSA colonization surveillance program. It is not intended to diagnose MRSA infection nor to guide or monitor treatment for MRSA infections. Performed at [facility], [address] [place], [place] [address]   Urine culture     Status: None   Collection Time: 06/26/18  1:35 PM  Result Value Ref Range Status   Specimen Description   Final    URINE, RANDOM Performed at [facility], [address] [place], [place] [address]    Special Requests   Final    Normal Performed at [facility], [address] [place], [place] [address]    Culture   Final    NO GROWTH Performed at [facility], [address] [place], [place] [address]    Report Status 06/27/2018 FINAL  Final  Surgical PCR screen     Status: None   Collection Time: 07/03/18 10:13 AM  Result Value Ref Range Status   MRSA, PCR NEGATIVE NEGATIVE Final   Staphylococcus aureus NEGATIVE NEGATIVE Final    Comment: (NOTE) The Xpert SA Assay (FDA approved for NASAL specimens in patients 75 years of age and older), is one component of a comprehensive surveillance program. It is not intended to diagnose infection nor to guide or monitor treatment. Performed at [facility], [address] [place], [place] [address]     Studies/Results: [employer] [facility] [employer]  Result Date: 07/03/2018 If [employer] image  not attached, placement could not be confirmed due to current cardiac rhythm.    Assessment/Plan: Fournier's Gangrene Debrided 3-3, 2-27 Cx E coli, mixed anaerobes DM2, poorly controlled A1C 12.1% HTN Superficial RUEDVT   Total days of antibiotics:11(ceftriaxone/flagyl)   appreciate surgical f/u. No plan for further surgery WBC better Would aim for 5 more days of anbx Available as needed.         Johny Sax MD, FACP Infectious Diseases (pager) [phone] [email or web address] 07/05/2018, 8:38 AM  LOS: 10 days

## 2018-07-06 ENCOUNTER — Inpatient Hospital Stay (HOSPITAL_COMMUNITY): Payer: Medicare Other

## 2018-07-06 DIAGNOSIS — I82409 Acute embolism and thrombosis of unspecified deep veins of unspecified lower extremity: Secondary | ICD-10-CM

## 2018-07-06 LAB — CBC WITH DIFFERENTIAL/PLATELET
Abs Immature Granulocytes: 0.22 10*3/uL — ABNORMAL HIGH (ref 0.00–0.07)
BASOS ABS: 0 10*3/uL (ref 0.0–0.1)
Basophils Relative: 0 %
Eosinophils Absolute: 0.1 10*3/uL (ref 0.0–0.5)
Eosinophils Relative: 0 %
HCT: 28.2 % — ABNORMAL LOW (ref 36.0–46.0)
Hemoglobin: 8.1 g/dL — ABNORMAL LOW (ref 12.0–15.0)
Immature Granulocytes: 1 %
Lymphocytes Relative: 14 %
Lymphs Abs: 2.7 10*3/uL (ref 0.7–4.0)
MCH: 24.7 pg — ABNORMAL LOW (ref 26.0–34.0)
MCHC: 28.7 g/dL — ABNORMAL LOW (ref 30.0–36.0)
MCV: 86 fL (ref 80.0–100.0)
Monocytes Absolute: 1.2 10*3/uL — ABNORMAL HIGH (ref 0.1–1.0)
Monocytes Relative: 6 %
Neutro Abs: 15.8 10*3/uL — ABNORMAL HIGH (ref 1.7–7.7)
Neutrophils Relative %: 79 %
Platelets: 453 10*3/uL — ABNORMAL HIGH (ref 150–400)
RBC: 3.28 MIL/uL — AB (ref 3.87–5.11)
RDW: 15.8 % — ABNORMAL HIGH (ref 11.5–15.5)
WBC: 20.1 10*3/uL — ABNORMAL HIGH (ref 4.0–10.5)
nRBC: 0.1 % (ref 0.0–0.2)

## 2018-07-06 LAB — GLUCOSE, CAPILLARY
GLUCOSE-CAPILLARY: 94 mg/dL (ref 70–99)
Glucose-Capillary: 101 mg/dL — ABNORMAL HIGH (ref 70–99)
Glucose-Capillary: 148 mg/dL — ABNORMAL HIGH (ref 70–99)
Glucose-Capillary: 143 mg/dL — ABNORMAL HIGH (ref 70–99)

## 2018-07-06 MED ORDER — METOPROLOL SUCCINATE ER 25 MG PO TB24: 25.0000 mg | ORAL_TABLET | Freq: Every day | ORAL | Status: DC

## 2018-07-06 MED ORDER — AMLODIPINE BESYLATE 10 MG PO TABS
10.0000 mg | ORAL_TABLET | Freq: Every day | ORAL | Status: DC
  Administered 2018-07-06 – 2018-07-08 (×3): 10 mg via ORAL
  Filled 2018-07-06 (×3): qty 1

## 2018-07-06 MED ORDER — ASPIRIN EC 81 MG PO TBEC
81.0000 mg | DELAYED_RELEASE_TABLET | Freq: Every day | ORAL | Status: DC
  Administered 2018-07-06 – 2018-07-08 (×3): 81 mg via ORAL
  Filled 2018-07-06 (×3): qty 1

## 2018-07-06 NOTE — Progress Notes (Signed)
3 Days Post-Op   Subjective/Chief Complaint: Awake, alert a little confused   Objective: Vital signs in last 24 hours: Temp:  [97.9 F (36.6 C)-99.5 F (37.5 C)] 97.9 F (36.6 C) (03/07 0810) Pulse Rate:  [86-93] 92 (03/07 0000) Resp:  [15-27] 19 (03/07 0400) BP: (143-173)/(52-86) 173/52 (03/07 0400) SpO2:  [80 %-100 %] 99 % (03/07 0400) Last BM Date: 07/03/18  Intake/Output from previous day: 03/06 0701 - 03/07 0700 In: 430 [P.O.:420; I.V.:10] Out: 775 [Urine:775] Intake/Output this shift: No intake/output data recorded. Dressing intact  Lab Results:  Recent Labs    07/05/18 0500 07/06/18 0524  WBC 21.6* 20.1*  HGB 8.4* 8.1*  HCT 29.0* 28.2*  PLT 407* 453*   BMET Recent Labs    07/04/18 0508  NA 136  K 3.8  CL 104  CO2 27  GLUCOSE 159*  BUN 12  CREATININE 0.59  CALCIUM 8.4*   PT/INR No results for input(s): LABPROT, INR in the last 72 hours. ABG No results for input(s): PHART, HCO3 in the last 72 hours.  Invalid input(s): PCO2, PO2  Studies/Results: No results found.  Anti-infectives: Anti-infectives (From admission, onward)   Start     Dose/Rate Route Frequency Ordered Stop   06/28/18 1400  metroNIDAZOLE (FLAGYL) tablet 500 mg     500 mg Oral Every 8 hours 06/28/18 1225     06/26/18 2200  vancomycin (VANCOCIN) 1,500 mg in sodium chloride 0.9 % 500 mL IVPB  Status:  Discontinued     1,500 mg 250 mL/hr over 120 Minutes Intravenous Every 24 hours 06/26/18 0446 06/28/18 1225   06/26/18 0600  clindamycin (CLEOCIN) IVPB 600 mg  Status:  Discontinued     600 mg 100 mL/hr over 30 Minutes Intravenous Every 8 hours 06/26/18 0508 06/28/18 1225   06/26/18 0415  clindamycin (CLEOCIN) IVPB 300 mg  Status:  Discontinued     300 mg 100 mL/hr over 30 Minutes Intravenous Every 8 hours 06/26/18 0356 06/26/18 0508   06/26/18 0400  cefTRIAXone (ROCEPHIN) 2 g in sodium chloride 0.9 % 100 mL IVPB  Status:  Discontinued     2 g 200 mL/hr over 30 Minutes  Intravenous Every 24 hours 06/26/18 0356 06/26/18 0409   06/25/18 2330  vancomycin (VANCOCIN) IVPB 1000 mg/200 mL premix     1,000 mg 200 mL/hr over 60 Minutes Intravenous  Once 06/25/18 1958 06/26/18 0015   06/25/18 1500  clindamycin (CLEOCIN) IVPB 600 mg     600 mg 100 mL/hr over 30 Minutes Intravenous  Once 06/25/18 1453 06/25/18 1657   06/25/18 1330  vancomycin (VANCOCIN) IVPB 1000 mg/200 mL premix     1,000 mg 200 mL/hr over 60 Minutes Intravenous  Once 06/25/18 1318 06/25/18 1606   06/25/18 1330  cefTRIAXone (ROCEPHIN) 2 g in sodium chloride 0.9 % 100 mL IVPB     2 g 200 mL/hr over 30 Minutes Intravenous Every 24 hours 06/25/18 1318        Assessment/Plan: Uncontrolled DM- A1c 12.1 HTN HLD CAD H/o STEMI 2011 s/p RCA stent COPD Acute on chronic anemia  Sepsis Necrotizing soft tissue infection multiple debridements S/p incision and drainage perirectal abscess 2/25 Dr. Maisie Fus S/p EXCISIONAL DEBRIDEMENT 2/27 Dr. Maisie Fus S/pEXCISIONAL DEBRIDEMENT, dressing change3/2 Dr. Dwain Sarna S/pexcisional debridement,dressing change3/4 Dr. Dwain Sarna - intraop cultures:RARE ESCHERICHIA COLI, resistant to cipro only - appreciate ID assistance with abx - BID wet to dry dressing change -does not need more or now based on wound, wbc coming down -will see again  Monday and if doing well involve wound care service ID -rocephin/flagyl2/25>>, clindamycin 2/25>>2/28, vancomycin 2/25>>2/28 VTE -SCDs,lovenox FEN -IVF,CM diet Foley - in place for retention Follow up -TBD  Courtney [name] 07/06/2018

## 2018-07-06 NOTE — Progress Notes (Signed)
PROGRESS NOTE    Courtney [name]  LYH:[id number] DOB: August 07, 1945 DOA: 06/25/2018 PCP: Jordan Hawks, PA-C   Brief Narrative: Patient is a 73 year old female with history of uncontrolled type diabetes mellitus, coronary artery disease, OSA on CPAP, asthma, COPD who presents to the emergency department with complaints of generalized weakness, gluteal abscess for several days.  She was found to be septic with [place] gangrene/NF and was taken emergently to the OR.  She has underwent  multiple debridements  by general surgery so far.ID also consulted for the guidance for antibiotics.Plan is to discharge her to [facility] after surgical clearance.  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  Currently on ceftriaxone and flagyl.  Negative blood cultures.  So far showed Ecoli and anareobes on wound cultures.  Wound care as per general surgery. ID consultation done  for recommendation on antibiotics.  Still has significant leukocytosis.  Afebrile. ID recommending same antibiotics still 07/10/18. Surgery is not planning for further debridement.  Continue daily dressing changes. She has been offered  bed [facility]).  We will discharge her there after surgical clearance.Case manager aware.  Stage II pressure ulcers of the right and left medial sacrum/right groin: Present on admission.  Wound care as per general surgery.  Optimize nutritional status.  Uncontrolled type 2 diabetes mellitus: Hemoglobin A1c of 12.1.  Follows with endocrinology.  Continue Lantus and sliding  scale  insulin for now.  Diabetic coordinator  Was following.  COPD/asthma: Not on any home medications.  Currently COPD is stable.  We will put her on albuterol inhaler on discharge.  Follow-up with pulmonology as an outpatient.  OSA: Continue CPAP at night.  Coronary artery disease: Was on  metoprolol, statin at home.  Not on aspirin at home.Will start aspirin.  Hypertension: Continue metoprolol at increased dose .  Added lisinopril.  BP fluctuating.Will add amlodipine also.  Continue to monitor BP  Hyperlipidemia: Continue statin  Obesity: BMI of 36.  Dietitian was following.  Bilateral lower extremity edema more on the left: We will check venous Doppler.  She had developed right upper extremity swelling few days ago, venous duplex negative for DVT.  Deconditioning/debility: Physical therapy evaluated her and recommended skilled nursing faciltiy on discharge.  She has a bed offer at [facility]. Will discharge her there after surgical clearance.  Nutrition Problem: Increased nutrient needs Etiology: wound healing, acute illness, post-op healing      DVT prophylaxis:Lovenox Code Status: Full Family Communication:  Called spouse on phone , call  not received Disposition Plan: [facility] after surgical clearance.Case manager aware but will notify when she nears discharge date.   Consultants: General surgery,ID  Procedures:Multiple  I&D/debridgements   Antimicrobials:  Anti-infectives (From admission, onward)   Start     Dose/Rate Route Frequency Ordered Stop   06/28/18 1400  metroNIDAZOLE (FLAGYL) tablet 500 mg     500 mg Oral Every 8 hours 06/28/18 1225     06/26/18 2200  vancomycin (VANCOCIN) 1,500 mg in sodium chloride 0.9 % 500 mL IVPB  Status:  Discontinued     1,500 mg 250 mL/hr over 120 Minutes Intravenous Every 24 hours 06/26/18 0446 06/28/18 1225   06/26/18 0600  clindamycin (CLEOCIN) IVPB 600 mg  Status:  Discontinued     600 mg 100 mL/hr over 30 Minutes Intravenous Every  8 hours 06/26/18 0508 06/28/18 1225   06/26/18 0415  clindamycin (CLEOCIN) IVPB 300 mg  Status:  Discontinued     300 mg 100 mL/hr over 30 Minutes Intravenous Every 8 hours 06/26/18 0356 06/26/18 0508   06/26/18 0400  cefTRIAXone (ROCEPHIN) 2 g in sodium chloride 0.9 % 100 mL IVPB  Status:  Discontinued     2 g 200 mL/hr over 30 Minutes Intravenous Every 24 hours 06/26/18 0356 06/26/18 0409   06/25/18 2330  vancomycin (VANCOCIN) IVPB 1000 mg/200 mL premix     1,000 mg 200 mL/hr over 60 Minutes Intravenous  Once 06/25/18 1958 06/26/18 0015   06/25/18 1500  clindamycin (CLEOCIN) IVPB 600 mg     600 mg 100 mL/hr over 30 Minutes Intravenous  Once 06/25/18 1453 06/25/18 1657   06/25/18 1330  vancomycin (VANCOCIN) IVPB 1000 mg/200 mL premix     1,000 mg 200 mL/hr over 60 Minutes Intravenous  Once 06/25/18 1318 06/25/18 1606   06/25/18 1330  cefTRIAXone (ROCEPHIN) 2 g in sodium chloride 0.9 % 100 mL IVPB     2 g 200 mL/hr over 30 Minutes Intravenous Every 24 hours 06/25/18 1318        Subjective: Patient seen and examined the bedside this morning.  Remains comfortable ,hemodynamically stable.  Denies any pain.  Nurse told me that she was little confused last night.  Most likely ICU delirium. Requested to make the room brighter and engage with family.   Objective: Vitals:   07/06/18 0200 07/06/18 0300 07/06/18 0400 07/06/18 0810  BP:   (!) 173/52   Pulse:      Resp: (!) Temp: 99 F (37.2 C) 99 F (37.2 C) 99 F (37.2 C) 97.9 F (36.6 C)  TempSrc:    Oral  SpO2:   99%   Weight:      Height:        Intake/Output Summary (Last 24 hours) at 07/06/2018 1136 Last data filed at 07/06/2018 0500 Gross per 24 hour  Intake 180 ml  Output 775 ml  Net -595 ml     Examination:   General exam: Appears calm and comfortable ,Not in distress,obese HEENT:PERRL,Oral mucosa moist, Ear/Nose normal on gross exam Respiratory system: Bilateral equal air entry, normal vesicular breath  sounds, no wheezes or crackles  Cardiovascular system: S1 & S2 heard, RRR. No JVD, murmurs, rubs, gallops or clicks. Gastrointestinal system: Abdomen is nondistended, soft and nontender. No organomegaly or masses felt. Normal bowel sounds heard. Central nervous system: Alert and oriented. No focal neurological deficits. Extremities: No clubbing ,no cyanosis, distal peripheral pulses palpable. Edema of the right upper extremity, edema of the bilateral lower extremities more on the left Skin/GU: Perineal wound  wrapped with dressings       Data Reviewed: I have personally reviewed following labs and imaging studies  CBC: Recent Labs  Lab 07/01/18 0458 07/02/18 0500 07/03/18 0638 07/04/18 0508 07/05/18 0500 07/06/18 0524  WBC 27.5* 30.0* 27.6* 30.4* 21.6* 20.1*  NEUTROABS 22.0* 25.1* 22.3*  --  17.3* 15.8*  HGB 9.3* 8.6* 8.3* 8.2* 8.4* 8.1*  HCT 30.9* 29.4* 27.8* 27.7* 29.0* 28.2*  MCV 82.6 83.1 83.5 84.2 86.6 86.0  PLT 266 285 319 368 407* 453*   Basic Metabolic Panel: Recent Labs  Lab 07/01/18 0458 07/03/18 0019 07/03/18 0638 07/04/18 0508  NA 135 134* 134* 136  K 3.8 3.9 3.8 3.8  CL 103 103 103 104  CO2 GLUCOSE 166* 233* 160* 159*  BUN CREATININE 0.51 0.63 0.52 0.59  CALCIUM 8.5* 8.1* 8.4* 8.4*  MG  --  1.7  --  2.1   GFR: Estimated Creatinine Clearance: 70.6 mL/min (by C-G formula based on SCr of 0.59 mg/dL). Liver Function Tests: No results for input(s): AST, ALT, ALKPHOS, BILITOT, PROT, ALBUMIN in the last 168 hours. No results for input(s): LIPASE, AMYLASE in the last 168 hours. No results for input(s): AMMONIA in the last 168 hours. Coagulation Profile: No results for input(s): INR, PROTIME in the last 168 hours. Cardiac Enzymes: No results for input(s): CKTOTAL, CKMB, CKMBINDEX, TROPONINI in the last 168 hours. BNP (last 3 results) No results for input(s): PROBNP in the last 8760 hours. HbA1C: No results for input(s): HGBA1C  in the last 72 hours. CBG: Recent Labs  Lab 07/05/18 1156 07/05/18 1820 07/05/18 2138 07/06/18 0812 07/06/18 1128  GLUCAP 149* 175* 179* 94 101*   Lipid Profile: No results for input(s): CHOL, HDL, LDLCALC, TRIG, CHOLHDL, LDLDIRECT in the last 72 hours. Thyroid Function Tests: No results for input(s): TSH, T4TOTAL, FREET4, T3FREE, THYROIDAB in the last 72 hours. Anemia Panel: No results for input(s): VITAMINB12, FOLATE, FERRITIN, TIBC, IRON, RETICCTPCT in the last 72 hours. Sepsis Labs: No results for input(s): PROCALCITON, LATICACIDVEN in the last 168 hours.  Recent Results (from the past 240 hour(s))  Urine culture     Status: None   Collection Time: 06/26/18  1:35 PM  Result Value Ref Range Status   Specimen Description   Final    URINE, RANDOM Performed at [facility], [address] [place], [place] [address]    Special Requests   Final    Normal Performed at [facility], [address] [place], [place] [address]    Culture   Final    NO GROWTH Performed at [facility], [address], [place], [place] [address]    Report Status 06/27/2018 FINAL  Final  Surgical PCR screen     Status: None   Collection Time: 07/03/18 10:13 AM  Result Value Ref Range Status   MRSA, PCR NEGATIVE NEGATIVE Final   Staphylococcus aureus NEGATIVE NEGATIVE Final    Comment: (NOTE) The Xpert SA Assay (FDA approved for NASAL specimens in patients 20 years of age and older), is one component of a comprehensive surveillance program. It is not intended to diagnose infection nor to guide or monitor treatment. Performed at [facility], [address] [place], [place] [address]          Radiology Studies: No results found.      Scheduled Meds: . amLODipine  10 mg Oral Daily  . chlorhexidine  15 mL Mouth Rinse BID  . Chlorhexidine Gluconate Cloth  6 each Topical Daily  . Chlorhexidine Gluconate Cloth  6 each  Topical Daily  . enoxaparin (LOVENOX) injection  40 mg Subcutaneous Q24H  . insulin aspart  0-5 Units Subcutaneous QHS  . insulin aspart  0-9 Units Subcutaneous TID WC  . insulin glargine  25 Units Subcutaneous Daily  . lisinopril  10 mg Oral Daily  . mouth rinse  15 mL Mouth  Rinse q12n4p  . metoprolol succinate  50 mg Oral Daily  . metroNIDAZOLE  500 mg Oral Q8H  . multivitamin with minerals  1 tablet Oral Daily  . nutrition supplement (JUVEN)  1 packet Oral BID BM  . Ensure Max Protein  11 oz Oral BID  . rosuvastatin  20 mg Oral Daily  . sodium chloride flush  10-40 mL Intracatheter Q12H  . sodium chloride flush  10-40 mL Intracatheter Q12H   Continuous Infusions: . sodium chloride Stopped (07/04/18 1355)  . cefTRIAXone (ROCEPHIN)  IV Stopped (07/05/18 1546)     LOS: 11 days    Time spent:25 mins. More than 50% of that time was spent in counseling and/or coordination of care.      Burnadette Pop, MD [facility] Pager [phone]  If 7PM-7AM, please contact night-coverage [email or web address] Password [facility] 07/06/2018, 11:36 AM

## 2018-07-06 NOTE — Progress Notes (Signed)
VASCULAR LAB PRELIMINARY  PRELIMINARY  PRELIMINARY  PRELIMINARY  Bilateral lower extremity venous duplex completed.    Preliminary report:  See CV Proc for results.  Carlyle Mcelrath, RVT 07/06/2018, 6:40 PM

## 2018-07-07 DIAGNOSIS — L89152 Pressure ulcer of sacral region, stage 2: Secondary | ICD-10-CM

## 2018-07-07 LAB — CBC WITH DIFFERENTIAL/PLATELET
ABS IMMATURE GRANULOCYTES: 0.17 10*3/uL — AB (ref 0.00–0.07)
Basophils Absolute: 0 10*3/uL (ref 0.0–0.1)
Basophils Relative: 0 %
Eosinophils Absolute: 0.1 10*3/uL (ref 0.0–0.5)
Eosinophils Relative: 0 %
HCT: 28.7 % — ABNORMAL LOW (ref 36.0–46.0)
Hemoglobin: 8.1 g/dL — ABNORMAL LOW (ref 12.0–15.0)
Immature Granulocytes: 1 %
Lymphocytes Relative: 15 %
Lymphs Abs: 2.6 10*3/uL (ref 0.7–4.0)
MCH: 24.3 pg — AB (ref 26.0–34.0)
MCHC: 28.2 g/dL — AB (ref 30.0–36.0)
MCV: 85.9 fL (ref 80.0–100.0)
MONO ABS: 1.2 10*3/uL — AB (ref 0.1–1.0)
Monocytes Relative: 7 %
Neutro Abs: 13.6 10*3/uL — ABNORMAL HIGH (ref 1.7–7.7)
Neutrophils Relative %: 77 %
Platelets: 470 10*3/uL — ABNORMAL HIGH (ref 150–400)
RBC: 3.34 MIL/uL — ABNORMAL LOW (ref 3.87–5.11)
RDW: 16 % — ABNORMAL HIGH (ref 11.5–15.5)
WBC: 17.6 10*3/uL — AB (ref 4.0–10.5)
nRBC: 0.2 % (ref 0.0–0.2)

## 2018-07-07 LAB — GLUCOSE, CAPILLARY
Glucose-Capillary: 88 mg/dL (ref 70–99)
Glucose-Capillary: 128 mg/dL — ABNORMAL HIGH (ref 70–99)
Glucose-Capillary: 192 mg/dL — ABNORMAL HIGH (ref 70–99)
Glucose-Capillary: 196 mg/dL — ABNORMAL HIGH (ref 70–99)

## 2018-07-07 LAB — BASIC METABOLIC PANEL
Anion gap: 4 — ABNORMAL LOW (ref 5–15)
BUN: 11 mg/dL (ref 8–23)
CHLORIDE: 102 mmol/L (ref 98–111)
CO2: 30 mmol/L (ref 22–32)
CREATININE: 0.49 mg/dL (ref 0.44–1.00)
Calcium: 7.2 mg/dL — ABNORMAL LOW (ref 8.9–10.3)
GFR calc Af Amer: >60 mL/min (ref >60)
GFR calc non Af Amer: >60 mL/min (ref >60)
Glucose, Bld: 105 mg/dL — ABNORMAL HIGH (ref 70–99)
POTASSIUM: 4.2 mmol/L (ref 3.5–5.1)
Sodium: 136 mmol/L (ref 135–145)

## 2018-07-07 NOTE — Progress Notes (Signed)
PROGRESS NOTE    Courtney [name]  NGE:[id number] DOB: [birth year] DOA: 06/25/2018 PCP: Barbarann Ehlers    Brief Narrative:  73 year old female presents with a painful swelling gluteal region, followed by foul smelling drainage.  On her initial physical examination she was afebrile, respiratory rate 20, blood pressure 139/87, lungs were clear to auscultation, heart S1-S2 present rhythmic, abdomen protuberant, soft nontender, her right gluteal region had a large wound which was draining foul smelling discharge.  CT of the pelvis showed findings highly suspicious for necrotizing fasciitis/Fournier's gangrene.  Soft tissue gas with process extending from the upper right thigh/right buttock region anteriorly and superiorly compressing the right lateral aspect of right cervix.  Anterior extension into the mons region greater on the right but also extending onto the left mons. Gas inflammatory soft tissue process extending anteriorly into the pubic symphysis and anteriorly to the right femoral region, and anterior and lateral aspect of the right pelvic and right lower abdominal wall.  Patient was admitted to the hospital working diagnosis of fasciitis/Fournier's gangrene.  She has been placed on IV broad spectrum biotics, and she was interviewed with multiple excisional debridements.   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  Fournier's gangrene complicated with sepsis present on admission. Patient tolerating IV antibiotic therapy well, she has remained afebrile, wbc is trending down to 17,6 from 20.1, culture positive for E coli,  resistant to ciprofloxacin. Continue with IV ceftriaxone #12, metronidazole #9, to complete therapy on 07/10/18. Pain is well controlled, continue physical therapy evaluation. Wound care per surgery recommendations.   2. Stage 2 pressure ulcers on the right and left medial sacrum and groin (present on admission). Continue frequent turning, and physical therapy. Patient is very weak and deconditioned.   3. T2DM with uncontrolled hyperglycemia. Continue glucose cover and monitoring with insulin sliding scale, and 25 units of insulin glargine, for basal insulin. Fasting glucose this am is 105.   4. COPD/ Asthma. Seems to be well controlled, no signs of exacerbation, continue oxymetry monitoring.   5. HTN. Will continue blood pressure control with lisinopril and amlodipine.  6. CAD with dyslipidemia. No chest pain, continue aspirin and statin therapy.  7. Morbid obesity with OSA. Calculated BMI 40. Continue cpap at night. Lower extremity edema with no dvt per [place].     DVT prophylaxis: enoxaparin   Code Status:  full Family Communication: no family at the bedside  Disposition Plan/ discharge barriers: pending clinical improvement, and surgical recommendations, plan for LTAC.   Body mass index is 40.11 kg/m. Malnutrition Type:  Nutrition Problem: Increased nutrient needs Etiology: wound healing, acute illness, post-op healing   Malnutrition Characteristics:  Signs/Symptoms: estimated needs   Nutrition Interventions:  Interventions: Other (Comment), MVI, Juven(Ensure Max)  RN Pressure Injury Documentation: Pressure Injury 06/25/18 Stage II -  Partial thickness loss of dermis presenting as a shallow open ulcer with a red, pink wound bed without slough. (Active)  06/25/18 2200  Location: Sacrum  Location Orientation: Right;Medial  Staging: Stage II -  Partial thickness loss of dermis presenting as a shallow open ulcer  with a red, pink wound bed without slough.  Wound Description  (Comments):   Present on Admission: Yes     Pressure Injury 06/25/18 Stage II -  Partial thickness loss of dermis presenting as a shallow open ulcer with a red, pink wound bed without slough. (Active)  06/25/18 2200  Location: Sacrum  Location Orientation: Left;Medial  Staging: Stage II -  Partial thickness loss of dermis presenting as a shallow open ulcer with a red, pink wound bed without slough.  Wound Description (Comments):   Present on Admission: Yes     Pressure Injury 06/25/18 Stage II -  Partial thickness loss of dermis presenting as a shallow open ulcer with a red, pink wound bed without slough. (Active)  06/25/18 2200  Location: Groin  Location Orientation: Right;Medial  Staging: Stage II -  Partial thickness loss of dermis presenting as a shallow open ulcer with a red, pink wound bed without slough.  Wound Description (Comments):   Present on Admission: Yes     Consultants:   Surgery   ID  Procedures:   I&D. 02/25, 02/27, 3/2, 3/4.   Antimicrobials:   Ceftriaxone and metronidazole.     Subjective: Patient's pain has been controlled with morphine, she is getting out of bed with assistance, no nausea or vomiting, no chest pain or dyspnea.   Objective: Vitals:   07/06/18 2000 07/06/18 2200 07/07/18 0000 07/07/18 0400  BP: (!) 142/64 (!) 160/91 (!) 163/63 (!) 147/48  Pulse:      Resp: 16 (!) 21 (!) 24 18  Temp: 99 F (37.2 C) 98.4 F (36.9 C) 98.8 F (37.1 C) 98.8 F (37.1 C)  TempSrc:    Core  SpO2: 99%  98% 99%  Weight:      Height:        Intake/Output Summary (Last 24 hours) at 07/07/2018 0836 Last data filed at 07/06/2018 1700 Gross per 24 hour  Intake 220 ml  Output 1300 ml  Net -1080 ml   Filed Weights   06/27/18 1241 06/29/18 0500 07/03/18 1107  Weight: 95.5 kg 102.7 kg 102.7 kg    Examination:   General: deconditioned  Neurology: Awake and alert, non focal  E ENT: mild pallor, no icterus, oral mucosa moist Cardiovascular: No JVD.  S1-S2 present, rhythmic, no gallops, rubs, or murmurs. +++ bilateral lower extremity edema. Pulmonary: positive breath sounds bilaterally, adequate air movement, no wheezing, rhonchi or rales. Gastrointestinal. Abdomen is protuberant with no organomegaly, non tender, no rebound or guarding Skin. Gluteal perineal wounds/ with dressing in place.  Musculoskeletal: no joint deformities     Data Reviewed: I have personally reviewed following labs and imaging studies  CBC: Recent Labs  Lab 07/02/18 0500 07/03/18 0638 07/04/18 0508 07/05/18 0500 07/06/18 0524 07/07/18 0509  WBC 30.0* 27.6* 30.4* 21.6* 20.1* 17.6*  NEUTROABS 25.1* 22.3*  --  17.3* 15.8* 13.6*  HGB 8.6* 8.3* 8.2* 8.4* 8.1* 8.1*  HCT 29.4* 27.8* 27.7* 29.0* 28.2* 28.7*  MCV 83.1 83.5 84.2 86.6 86.0 85.9  PLT 285 319 368 407* 453* 470*   Basic Metabolic Panel: Recent Labs  Lab 07/01/18 0458 07/03/18 0019 07/03/18 0638 07/04/18 0508 07/07/18 0509  NA 135 134* 134* 136 136  K 3.8 3.9 3.8 3.8 4.2  CL 103 103 103 104 102  CO2 25 27 27 27 30   GLUCOSE 166* 233* 160* 159* 105*  BUN 12 14 13 12 11   CREATININE 0.51 0.63 0.52 0.59 0.49  CALCIUM 8.5* 8.1* 8.4* 8.4* 7.2*  MG  --  1.7  --  2.1  --    GFR: Estimated Creatinine Clearance: 70.6 mL/min (by C-G formula based on SCr of 0.49 mg/dL). Liver Function Tests: No results for input(s): AST, ALT, ALKPHOS, BILITOT, PROT, ALBUMIN in the last 168 hours. No results for input(s): LIPASE, AMYLASE in the last 168 hours. No results for input(s): AMMONIA in the last 168 hours. Coagulation Profile: No results for input(s): INR, PROTIME in the last 168 hours. Cardiac Enzymes: No results for input(s): CKTOTAL, CKMB, CKMBINDEX, TROPONINI in the last 168 hours. BNP (last 3 results) No results for input(s): PROBNP in the last 8760 hours. HbA1C: No results for input(s): HGBA1C in the last 72 hours. CBG: Recent Labs  Lab 07/06/18 0812 07/06/18 1128 07/06/18 1715  07/06/18 2144 07/07/18 0729  GLUCAP 94 101* 148* 143* 88   Lipid Profile: No results for input(s): CHOL, HDL, LDLCALC, TRIG, CHOLHDL, LDLDIRECT in the last 72 hours. Thyroid Function Tests: No results for input(s): TSH, T4TOTAL, FREET4, T3FREE, THYROIDAB in the last 72 hours. Anemia Panel: No results for input(s): VITAMINB12, FOLATE, FERRITIN, TIBC, IRON, RETICCTPCT in the last 72 hours.    Radiology Studies: I have reviewed all of the imaging during this hospital visit personally     Scheduled Meds: . amLODipine  10 mg Oral Daily  . aspirin EC  81 mg Oral Daily  . chlorhexidine  15 mL Mouth Rinse BID  . Chlorhexidine Gluconate Cloth  6 each Topical Daily  . Chlorhexidine Gluconate Cloth  6 each Topical Daily  . enoxaparin (LOVENOX) injection  40 mg Subcutaneous Q24H  . insulin aspart  0-5 Units Subcutaneous QHS  . insulin aspart  0-9 Units Subcutaneous TID WC  . insulin glargine  25 Units Subcutaneous Daily  . lisinopril  10 mg Oral Daily  . mouth rinse  15 mL Mouth Rinse q12n4p  . metoprolol succinate  50 mg Oral Daily  . metroNIDAZOLE  500 mg Oral Q8H  . multivitamin with minerals  1 tablet Oral Daily  . nutrition supplement (JUVEN)  1 packet Oral BID BM  . Ensure Max Protein  11 oz Oral BID  . rosuvastatin  20 mg Oral Daily  . sodium chloride flush  10-40 mL Intracatheter Q12H  . sodium chloride flush  10-40 mL Intracatheter Q12H   Continuous Infusions: . sodium chloride Stopped (07/04/18 1355)  . cefTRIAXone (ROCEPHIN)  IV Stopped (07/06/18 1416)     LOS: 12 days        Damonie Furney Annett Gula, MD

## 2018-07-08 DIAGNOSIS — I10 Essential (primary) hypertension: Secondary | ICD-10-CM | POA: Diagnosis present

## 2018-07-08 DIAGNOSIS — A419 Sepsis, unspecified organism: Principal | ICD-10-CM

## 2018-07-08 DIAGNOSIS — E0865 Diabetes mellitus due to underlying condition with hyperglycemia: Secondary | ICD-10-CM

## 2018-07-08 DIAGNOSIS — E66813 Obesity, class 3: Secondary | ICD-10-CM | POA: Diagnosis present

## 2018-07-08 LAB — CBC WITH DIFFERENTIAL/PLATELET
Abs Immature Granulocytes: 0.12 10*3/uL — ABNORMAL HIGH (ref 0.00–0.07)
Basophils Absolute: 0 10*3/uL (ref 0.0–0.1)
Basophils Relative: 0 %
Eosinophils Absolute: 0.1 10*3/uL (ref 0.0–0.5)
Eosinophils Relative: 1 %
HCT: 28.7 % — ABNORMAL LOW (ref 36.0–46.0)
Hemoglobin: 8.1 g/dL — ABNORMAL LOW (ref 12.0–15.0)
Immature Granulocytes: 1 %
LYMPHS PCT: 15 %
Lymphs Abs: 2.1 10*3/uL (ref 0.7–4.0)
MCH: 24.3 pg — ABNORMAL LOW (ref 26.0–34.0)
MCHC: 28.2 g/dL — ABNORMAL LOW (ref 30.0–36.0)
MCV: 86.2 fL (ref 80.0–100.0)
Monocytes Absolute: 1 10*3/uL (ref 0.1–1.0)
Monocytes Relative: 7 %
Neutro Abs: 10.9 10*3/uL — ABNORMAL HIGH (ref 1.7–7.7)
Neutrophils Relative %: 76 %
Platelets: 464 10*3/uL — ABNORMAL HIGH (ref 150–400)
RBC: 3.33 MIL/uL — ABNORMAL LOW (ref 3.87–5.11)
RDW: 16.3 % — ABNORMAL HIGH (ref 11.5–15.5)
WBC: 14.2 10*3/uL — ABNORMAL HIGH (ref 4.0–10.5)
nRBC: 0.2 % (ref 0.0–0.2)

## 2018-07-08 LAB — BASIC METABOLIC PANEL
Anion gap: 5 (ref 5–15)
BUN: 11 mg/dL (ref 8–23)
CO2: 29 mmol/L (ref 22–32)
Calcium: 8.3 mg/dL — ABNORMAL LOW (ref 8.9–10.3)
Chloride: 103 mmol/L (ref 98–111)
Creatinine, Ser: 0.46 mg/dL (ref 0.44–1.00)
GFR calc Af Amer: >60 mL/min (ref >60)
GFR calc non Af Amer: >60 mL/min (ref >60)
GLUCOSE: 130 mg/dL — AB (ref 70–99)
Potassium: 3.4 mmol/L — ABNORMAL LOW (ref 3.5–5.1)
Sodium: 137 mmol/L (ref 135–145)

## 2018-07-08 LAB — GLUCOSE, CAPILLARY
Glucose-Capillary: 113 mg/dL — ABNORMAL HIGH (ref 70–99)
Glucose-Capillary: 106 mg/dL — ABNORMAL HIGH (ref 70–99)

## 2018-07-08 MED ORDER — ASPIRIN 81 MG PO TBEC: 81.0000 mg | DELAYED_RELEASE_TABLET | Freq: Every day | ORAL | 0 refills | Status: DC

## 2018-07-08 MED ORDER — SODIUM CHLORIDE 0.9 % IV SOLN: 2.0000 g | INTRAVENOUS | 0 refills | Status: AC

## 2018-07-08 MED ORDER — LISINOPRIL 10 MG PO TABS: 10.0000 mg | ORAL_TABLET | Freq: Every day | ORAL | 0 refills | Status: DC

## 2018-07-08 MED ORDER — METOPROLOL SUCCINATE ER 50 MG PO TB24: 50.0000 mg | ORAL_TABLET | Freq: Every day | ORAL | 0 refills | Status: DC

## 2018-07-08 MED ORDER — POTASSIUM CHLORIDE CRYS ER 20 MEQ PO TBCR
40.0000 meq | EXTENDED_RELEASE_TABLET | Freq: Once | ORAL | Status: AC
  Administered 2018-07-08: 40 meq via ORAL
  Filled 2018-07-08: qty 2

## 2018-07-08 MED ORDER — ENSURE MAX PROTEIN PO LIQD: 11.0000 [oz_av] | Freq: Two times a day (BID) | ORAL | 0 refills | Status: DC

## 2018-07-08 MED ORDER — IPRATROPIUM-ALBUTEROL 0.5-2.5 (3) MG/3ML IN SOLN: 3.0000 mL | Freq: Four times a day (QID) | RESPIRATORY_TRACT | 0 refills | Status: DC | PRN

## 2018-07-08 MED ORDER — JUVEN PO PACK: 1.0000 | PACK | Freq: Two times a day (BID) | ORAL | 0 refills | Status: DC

## 2018-07-08 MED ORDER — OXYCODONE HCL 5 MG PO TABS: 5.0000 mg | ORAL_TABLET | ORAL | 0 refills | Status: DC | PRN

## 2018-07-08 MED ORDER — AMLODIPINE BESYLATE 10 MG PO TABS: 10.0000 mg | ORAL_TABLET | Freq: Every day | ORAL | 0 refills | Status: DC

## 2018-07-08 MED ORDER — METRONIDAZOLE 500 MG PO TABS: 500.0000 mg | ORAL_TABLET | Freq: Three times a day (TID) | ORAL | 0 refills | Status: AC

## 2018-07-08 NOTE — Progress Notes (Signed)
RN called to give a report at 1610. [facility] set up.

## 2018-07-08 NOTE — Discharge Summary (Signed)
Physician Discharge Summary  Courtney [name] ZOX:[id number] DOB: 1945/06/14 DOA: 06/25/2018  PCP: Jordan Hawks, PA-C  Admit date: 06/25/2018 Discharge date: 07/08/2018  Admitted From: Home Disposition: LTAC ([facility])  Recommendations for Outpatient Follow-up:  Follow up with MD at [facility] in 1 week. Patient will complete 2-week course of antibiotic (Rocephin and Flagyl) after 3/12. Follow-up with surgery in 2 weeks  Home Health: None Equipment/Devices: None.  Patient will be discharged on Foley catheter for urinary retention and avoid swelling.  Attempt voiding trial as outpatient.  Discharge Condition: Fair CODE STATUS: Full code Diet recommendation: Carb modified/heart healthy    Discharge Diagnoses:  Principal Problem:   Sepsis (HCC)  Active Problems:   Fournier's gangrene in female   Normocytic anemia   HLD (hyperlipidemia)   Restless leg syndrome   COPD (chronic obstructive pulmonary disease) (HCC)   CAD (coronary artery disease)   OSA and COPD overlap syndrome (HCC)   Pressure injury of skin   Diabetes mellitus due to underlying condition, uncontrolled, with hyperglycemia, with long-term current use of insulin (HCC)   Cigarette smoker   Uncontrolled hypertension   Obesity, Class III, BMI 40-49.9 (morbid obesity) (HCC)  Brief narrative/HPI Please refer to admission H&P for details, in brief,73 year old female presents with a painful swelling gluteal region, followed by foul smelling drainage.  On her initial physical examination she was afebrile, respiratory rate 20, blood pressure 139/87, lungs were clear to auscultation, heart S1-S2 present rhythmic, abdomen protuberant, soft nontender, her right gluteal region had a large wound which was draining foul smelling discharge.  CT of the pelvis showed findings highly suspicious for necrotizing fasciitis/Fournier's gangrene.  Soft tissue gas with process extending from the upper right thigh/right buttock region anteriorly and  superiorly compressing the right lateral aspect of right cervix.  Anterior extension into the mons region greater on the right but also extending onto the left mons. Gas inflammatory soft tissue process extending anteriorly into the pubic symphysis and anteriorly to the right femoral region, and anterior and lateral aspect of the right pelvic and right lower abdominal wall. Patient was admitted for sepsis secondary to necrotizing fasciitis/Fournier's gangrene.  Hospital course Sepsis secondary to Fournier's gangrene and female  S/p incision and drainage perirectal abscess 2/25 Dr. Maisie Fus S/p EXCISIONAL DEBRIDEMENT 2/27 Dr. Maisie Fus S/pEXCISIONAL DEBRIDEMENT, dressing change3/2 Dr. Dwain Sarna S/pexcisional debridement,dressing change3/4 Dr. Dwain Sarna - intraop cultures:RARE ESCHERICHIA COLI, resistant to cipro only.  Resolved and clinically stable for discharge per surgery. -Surgery recommends no further OR needed.  Any fibrinous tissue present, should resolve with dressing changes.  Twice daily wet-to-dry dressing changes. - on empiric antibiotic with with IV Rocephin and Flagyl to complete 2 weeks course after 3/1 12.  Pain controlled on oxycodone and occasionally needs Robaxin.  Surgery and wound care recommendation appreciated.  PT recommends LTAC. Follow-up with Dr. Maisie Fus (surgery) in 2 weeks.  Active symptoms Type 2 diabetes mellitus, uncontrolled with hyperglycemia A1c of 12.  On 25 units of Lantus and sliding scale insulin with stable blood glucose.  Adjust as needed.  COPD/asthma Stable.  Uncontrolled hypertension Increased dose of lisinopril and Toprol.  Added amlodipine.  Currently stable. /CAD with dyslipidemia Continue aspirin and statin.  Morbid obesity with obstructive sleep apnea BMI >40.  Continue nighttime CPAP.  Lower extremity edema noted with Doppler negative for DVT.  Respiratory function stable. Added nutritional supplement for postop healing.  Stage II  pressure ulcers of the right and left medial sacrum and groin (present on admission) Continue frequent  positioning and physical therapy.  Wound care consult appreciated.  Procedure: CT abdomen pelvis/Doppler lower extremity and surgical debridement  Consult: [facility] surgery, ID Disposition: LTAC  Family communication: None at bedside   Discharge Instructions   Allergies as of 07/08/2018      Reactions   Eggs Or Egg-derived Products    Penicillins    Did it involve swelling of the face/tongue/throat, SOB, or low BP? No Did it involve sudden or severe rash/hives, skin peeling, or any reaction on the inside of your mouth or nose? Yes Did you need to seek medical attention at a hospital or doctor's office? yes When did it last happen?more than 10 years If all above answers are "NO", may proceed with cephalosporin use.      Medication List    TAKE these medications   amLODipine 10 MG tablet Commonly known as:  NORVASC Take 1 tablet (10 mg total) by mouth daily. Start taking on:  July 09, 2018   aspirin 81 MG EC tablet Take 1 tablet (81 mg total) by mouth daily. Start taking on:  July 09, 2018   cefTRIAXone 2 g in sodium chloride 0.9 % 100 mL Inject 2 g into the vein daily for 3 days. Start taking on:  July 09, 2018   ipratropium-albuterol 0.5-2.5 (3) MG/3ML Soln Commonly known as:  DUONEB Take 3 mLs by nebulization every 6 (six) hours as needed.   Lantus SoloStar 100 UNIT/ML Solostar Pen Generic drug:  Insulin Glargine Inject 25 Units into the skin daily.   lisinopril 10 MG tablet Commonly known as:  PRINIVIL,ZESTRIL Take 1 tablet (10 mg total) by mouth daily for 30 days. What changed:    medication strength  how much to take   metoprolol succinate 50 MG 24 hr tablet Commonly known as:  TOPROL-XL Take 1 tablet (50 mg total) by mouth daily. What changed:    medication strength  how much to take   metroNIDAZOLE 500 MG tablet Commonly known as:   FLAGYL Take 1 tablet (500 mg total) by mouth every 8 (eight) hours for 3 days.   Ensure Max Protein Liqd Take 330 mLs (11 oz total) by mouth 2 (two) times daily.   nutrition supplement (JUVEN) Pack Take 1 packet by mouth 2 (two) times daily between meals. Start taking on:  July 09, 2018   oxyCODONE 5 MG immediate release tablet Commonly known as:  Oxy IR/ROXICODONE Take 1 tablet (5 mg total) by mouth every 4 (four) hours as needed for moderate pain.   rosuvastatin 20 MG tablet Commonly known as:  CRESTOR Take 1 tablet (20 mg total) by mouth daily.      Follow-up Information    Romie Levee, MD Follow up in 2 week(s).   Specialty:  General Surgery Why:  call to arranage an appointment 2 weeks after discharge from [facility] facility Contact information: [address] [place] [address] [phone]        MD at [facility] in 1 week Follow up.          Allergies  Allergen Reactions  . Eggs Or Egg-Derived Products   . Penicillins     Did it involve swelling of the face/tongue/throat, SOB, or low BP? No Did it involve sudden or severe rash/hives, skin peeling, or any reaction on the inside of your mouth or nose? Yes Did you need to seek medical attention at a hospital or doctor's office? yes When did it last happen?more than 10 years  If all above answers are "NO", may proceed with cephalosporin use.      Procedures/Studies: X-ray Chest Pa Or Ap  Result Date: 06/25/2018 CLINICAL DATA:  Central line placement EXAM: CHEST  1 VIEW COMPARISON:  02/11/2010 FINDINGS: Right internal jugular central line is been placed with the tip in the lower SVC. No pneumothorax. Heart is mildly enlarged with perihilar and lower lobe opacities and diffuse interstitial prominence, likely mild edema/CHF. No effusions or acute bony abnormality. IMPRESSION: Right central line tip in the SVC.  No pneumothorax. Mild edema/CHF. Electronically Signed   By: Charlett Nose M.D.   On:  06/25/2018 18:38   Ct Abdomen Pelvis W Contrast  Result Date: 06/25/2018 CLINICAL DATA:  73 year old female with ball on buttock noticed yesterday. Initial encounter. EXAM: CT ABDOMEN AND PELVIS WITH CONTRAST TECHNIQUE: Multidetector CT imaging of the abdomen and pelvis was performed using the standard protocol following bolus administration of intravenous contrast. CONTRAST:  ISOVUE-300 IOPAMIDOL (ISOVUE-300) INJECTION 61% COMPARISON:  None. FINDINGS: Lower chest: Minimal basilar atelectasis. Mild cardiomegaly. Coronary artery calcifications. Hepatobiliary: No worrisome hepatic lesion. Suspect fatty infiltration. Post cholecystectomy. Pancreas: No worrisome pancreatic mass or inflammation. Spleen: Subcentimeter area of enhancement dome of spleen of indeterminate etiology or significance. Spleen not enlarged. Adrenals/Urinary Tract: No obstructing stone or hydronephrosis. Several renal low-density structures too small to characterize although statistically likely cysts. Hyperplasia adrenal glands. Prominent size urinary bladder. Noncontrast filled imaging without acute abnormality. Stomach/Bowel: Besides necrotizing fasciitis impressing upon the right lateral aspect of the rectum, no primary bowel inflammatory process is noted. Appendix not clearly delineated. Portions of the stomach, small bowel and colon are under distended limiting evaluation. Vascular/Lymphatic: Atherosclerotic changes aorta and aortic branch vessels. No abdominal aortic aneurysm or large vessel occlusion. Inguinal adenopathy greater on right. Slightly prominent size right external iliac lymph node. Reproductive: Post hysterectomy.  No worrisome adnexal mass. 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 Soft tissue gas and inflammatory process extends from the upper right thigh/right buttock region anteriorly and superiorly impressing upon the right lateral aspect of the rectum and lower cervix.  Anterior extension into the mons region greater on the right but also extending into the left mons. Gas and inflammatory soft tissue process than extends anterior to the pubic symphysis and anterior to the right femoral region (immediately adjacent to femoral vessels) and along the anterior and lateral aspect of the right pelvic and right lower abdominal wall. The rectum is immediately adjacent to this inflammatory process and displaced slightly to the left. No clear communication with the rectum identified. Lateral to the upper right femur is a 1.3 cm subcutaneous lesion possibly a sebaceous cyst. If no free intraperitoneal air or drainable fluid collection. No bowel containing hernia. Small fat containing hernia periumbilical region. Musculoskeletal: Degenerative changes lower thoracic and lumbar spine most notable L4-5 and L5-S1 level. Mild hip joint degenerative changes and sacroiliac joint degenerative changes. IMPRESSION: 1. 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 Soft tissue gas and inflammatory process extends from the upper right thigh/right buttock region anteriorly and superiorly impressing upon the right lateral aspect of the rectum and lower cervix. Anterior extension into the mons region greater on the right but also extending into the left mons. Gas and inflammatory soft tissue process than extends anterior to the pubic symphysis and anterior to the right femoral region (immediately adjacent to femoral vessels) and along the anterior and lateral aspect of the right pelvic and right lower abdominal wall. 2. The rectum is immediately  adjacent to this inflammatory process and displaced slightly to the left. No clear communication with the rectum identified. 3. Inguinal adenopathy greater on the right. 4. Lateral to the upper right femur is a 1.3 cm subcutaneous lesion possibly a sebaceous cyst. 5. Subcentimeter hyperdensity dome of the spleen of indeterminate  etiology/significance. 6. Scattered small low-density renal lesions too small to characterize statistically likely cysts. 7. Suspect fatty infiltration liver. 8. Post cholecystectomy. 9. Appendix not visualized. 10.  Aortic Atherosclerosis (ICD10-I70.0). 11. Mild cardiomegaly with coronary artery calcifications. 12. Degenerative changes lower thoracic and lumbar spine. These results were called by telephone at the time of interpretation on 06/25/2018 at 3:22 pm to Dr. Meridee Score , who verbally acknowledged these results. Electronically Signed   By: Lacy Duverney M.D.   On: 06/25/2018 15:51   Vas [place] Lower Extremity Venous (dvt)  Result Date: 07/07/2018  Lower Venous Study Indications: Edema.  Limitations: Body habitus, bandages and significant edema. Comparison Study: No prior study for comparison Performing Technologist: Sherren Kerns RVS  Examination Guidelines: A complete evaluation includes B-mode imaging, spectral Doppler, color Doppler, and power Doppler as needed of all accessible portions of each vessel. Bilateral testing is considered an integral part of a complete examination. Limited examinations for reoccurring indications may be performed as noted.  Right Venous Findings: +---------+---------------+---------+-----------+----------+--------------+          CompressibilityPhasicitySpontaneityPropertiesSummary        +---------+---------------+---------+-----------+----------+--------------+ CFV                                                   Not visualized +---------+---------------+---------+-----------+----------+--------------+ SFJ                                                   Not visualized +---------+---------------+---------+-----------+----------+--------------+ FV Prox  Full                                                        +---------+---------------+---------+-----------+----------+--------------+ FV Mid   Full                                                         +---------+---------------+---------+-----------+----------+--------------+ FV DistalFull                                                        +---------+---------------+---------+-----------+----------+--------------+ PFV                                                   Not visualized +---------+---------------+---------+-----------+----------+--------------+ POP      Full  Yes      Yes                                 +---------+---------------+---------+-----------+----------+--------------+ PTV      Full                                                        +---------+---------------+---------+-----------+----------+--------------+ PERO                                                  Not visualized +---------+---------------+---------+-----------+----------+--------------+  Right Technical Findings: Not visualized segments include common femoral, SFJ, profunda, peroneal veins.  Left Venous Findings: +---------+---------------+---------+-----------+----------+--------------+          CompressibilityPhasicitySpontaneityPropertiesSummary        +---------+---------------+---------+-----------+----------+--------------+ CFV      Full           Yes      Yes                                 +---------+---------------+---------+-----------+----------+--------------+ SFJ      Full                                                        +---------+---------------+---------+-----------+----------+--------------+ FV Prox  Full                                                        +---------+---------------+---------+-----------+----------+--------------+ FV Mid   Full                                                        +---------+---------------+---------+-----------+----------+--------------+ FV DistalFull                                                         +---------+---------------+---------+-----------+----------+--------------+ PFV                                                   Not visualized +---------+---------------+---------+-----------+----------+--------------+ POP      Full                                                        +---------+---------------+---------+-----------+----------+--------------+  PTV      Full                                                        +---------+---------------+---------+-----------+----------+--------------+ PERO                                                  Not visualized +---------+---------------+---------+-----------+----------+--------------+  Left Technical Findings: Not visualized segments include profunda and peroneal veins.  No visualized venous thrombosis, but multiple segments not visualized and limited study.  *See table(s) above for measurements and observations. Electronically signed by Sherald Hess MD on 07/07/2018 at 11:07:56 AM.    Final    Vas [place] Upper Extremity Venous Duplex  Result Date: 07/02/2018 UPPER VENOUS STUDY  Indications: rule out DVT Limitations: Central line on site; immobility of patient's right arm. Comparison Study: No comparison study available. Performing Technologist: Melodie Bouillon  Examination Guidelines: A complete evaluation includes B-mode imaging, spectral Doppler, color Doppler, and power Doppler as needed of all accessible portions of each vessel. Bilateral testing is considered an integral part of a complete examination. Limited examinations for reoccurring indications may be performed as noted.  Right Findings: +----------+------------+----------+---------+-----------+---------------------+ RIGHT     CompressiblePropertiesPhasicitySpontaneous       Summary        +----------+------------+----------+---------+-----------+---------------------+ IJV                                Yes       Yes     Not well visualized                                                       with compression due                                                       to central line on                                                               site          +----------+------------+----------+---------+-----------+---------------------+ Subclavian    Full                 Yes       Yes                          +----------+------------+----------+---------+-----------+---------------------+ Axillary      Full                 Yes       Yes                          +----------+------------+----------+---------+-----------+---------------------+  Brachial      Full                 Yes       Yes                          +----------+------------+----------+---------+-----------+---------------------+ Radial                                                 Not visualized     +----------+------------+----------+---------+-----------+---------------------+ Ulnar                                                  Not visualized     +----------+------------+----------+---------+-----------+---------------------+ Cephalic    Partial                                         Acute         +----------+------------+----------+---------+-----------+---------------------+ Basilic       Full                 Yes       Yes                          +----------+------------+----------+---------+-----------+---------------------+ Cephalic: distal to forearm unable to compress.  Left Findings: +----------+------------+----------+---------+-----------+-------+ LEFT      CompressiblePropertiesPhasicitySpontaneousSummary +----------+------------+----------+---------+-----------+-------+ Subclavian    Full                 Yes       Yes            +----------+------------+----------+---------+-----------+-------+  Summary:  Right: No evidence of thrombosis in the subclavian. Findings consistent with acute  superficial vein thrombosis involving the right cephalic vein distal segment . This was a limited study due to patient cannot move her arm with tightedness position, distal segment of brachial veins unable to exam .  Left: No evidence of thrombosis in the subclavian.  *See table(s) above for measurements and observations.  Diagnosing physician: Waverly Ferrari MD Electronically signed by Waverly Ferrari MD on 07/02/2018 at 3:55:16 PM.    Final    [place] Ekg Site Rite  Result Date: 07/03/2018 If Site Rite image not attached, placement could not be confirmed due to current cardiac rhythm.   (Echo, Carotid, EGD, Colonoscopy, ERCP)    Subjective:   Discharge Exam: Vitals:   07/08/18 0900 07/08/18 1232  BP:  (!) 141/60  Pulse:  78  Resp: 16 18  Temp: 98.8 F (37.1 C) 98.1 F (36.7 C)  SpO2: 100% 100%   Vitals:   07/08/18 0400 07/08/18 0800 07/08/18 0900 07/08/18 1232  BP: (!) 149/51 133/68  (!) 141/60  Pulse:    78  Resp: 18 16 16 18   Temp: 99.1 F (37.3 C) 99 F (37.2 C) 98.8 F (37.1 C) 98.1 F (36.7 C)  TempSrc:  Core (Comment)  Oral  SpO2:  100% 100% 100%  Weight:      Height:        General: Elderly morbidly obese female in no acute distress HEENT: Moist mucosa, supple  neck Chest: Clear bilaterally CVS: Normal S1 and S2, no murmurs GI: Soft, nondistended, nontender, clean wound over the groin area with some healthy fibrinous tissue anteriorly.  No purulent discharge.  Foley draining clear urine. Musculoskeletal: Warm, no edema CNS: Alert and oriented, nonfocal   The results of significant diagnostics from this hospitalization (including imaging, microbiology, ancillary and laboratory) are listed below for reference.     Microbiology: Recent Results (from the past 240 hour(s))  Surgical PCR screen     Status: None   Collection Time: 07/03/18 10:13 AM  Result Value Ref Range Status   MRSA, PCR NEGATIVE NEGATIVE Final   Staphylococcus aureus NEGATIVE NEGATIVE  Final    Comment: (NOTE) The Xpert SA Assay (FDA approved for NASAL specimens in patients 58 years of age and older), is one component of a comprehensive surveillance program. It is not intended to diagnose infection nor to guide or monitor treatment. Performed at [facility], [address], [place], [place] [address]      Labs: BNP (last 3 results) No results for input(s): BNP in the last 8760 hours. Basic Metabolic Panel: Recent Labs  Lab 07/03/18 0019 07/03/18 4580 07/04/18 0508 07/07/18 0509 07/08/18 0459  NA 134* 134* 136 136 137  K 3.9 3.8 3.8 4.2 3.4*  CL 103 103 104 102 103  CO2 27 27 27 30 29   GLUCOSE 233* 160* 159* 105* 130*  BUN 14 13 12 11 11   CREATININE 0.63 0.52 0.59 0.49 0.46  CALCIUM 8.1* 8.4* 8.4* 7.2* 8.3*  MG 1.7  --  2.1  --   --    Liver Function Tests: No results for input(s): AST, ALT, ALKPHOS, BILITOT, PROT, ALBUMIN in the last 168 hours. No results for input(s): LIPASE, AMYLASE in the last 168 hours. No results for input(s): AMMONIA in the last 168 hours. CBC: Recent Labs  Lab 07/03/18 0638 07/04/18 0508 07/05/18 0500 07/06/18 0524 07/07/18 0509 07/08/18 0459  WBC 27.6* 30.4* 21.6* 20.1* 17.6* 14.2*  NEUTROABS 22.3*  --  17.3* 15.8* 13.6* 10.9*  HGB 8.3* 8.2* 8.4* 8.1* 8.1* 8.1*  HCT 27.8* 27.7* 29.0* 28.2* 28.7* 28.7*  MCV 83.5 84.2 86.6 86.0 85.9 86.2  PLT 319 368 407* 453* 470* 464*   Cardiac Enzymes: No results for input(s): CKTOTAL, CKMB, CKMBINDEX, TROPONINI in the last 168 hours. BNP: Invalid input(s): POCBNP CBG: Recent Labs  Lab 07/07/18 1228 07/07/18 1700 07/07/18 2103 07/08/18 0738 07/08/18 1137  GLUCAP 128* 192* 196* 113* 106*   D-Dimer No results for input(s): DDIMER in the last 72 hours. Hgb A1c No results for input(s): HGBA1C in the last 72 hours. Lipid Profile No results for input(s): CHOL, HDL, LDLCALC, TRIG, CHOLHDL, LDLDIRECT in the last 72 hours. Thyroid function studies No  results for input(s): TSH, T4TOTAL, T3FREE, THYROIDAB in the last 72 hours.  Invalid input(s): FREET3 Anemia work up No results for input(s): VITAMINB12, FOLATE, FERRITIN, TIBC, IRON, RETICCTPCT in the last 72 hours. Urinalysis    Component Value Date/Time   COLORURINE AMBER (A) 06/26/2018 1335   APPEARANCEUR HAZY (A) 06/26/2018 1335   LABSPEC 1.040 (H) 06/26/2018 1335   PHURINE 6.0 06/26/2018 1335   GLUCOSEU 50 (A) 06/26/2018 1335   HGBUR NEGATIVE 06/26/2018 1335   BILIRUBINUR NEGATIVE 06/26/2018 1335   KETONESUR 5 (A) 06/26/2018 1335   PROTEINUR 30 (A) 06/26/2018 1335   UROBILINOGEN 1.0 02/11/2010 1320   NITRITE NEGATIVE 06/26/2018 1335   LEUKOCYTESUR NEGATIVE 06/26/2018 1335   Sepsis Labs Invalid  input(s): PROCALCITONIN,  WBC,  LACTICIDVEN Microbiology Recent Results (from the past 240 hour(s))  Surgical PCR screen     Status: None   Collection Time: 07/03/18 10:13 AM  Result Value Ref Range Status   MRSA, PCR NEGATIVE NEGATIVE Final   Staphylococcus aureus NEGATIVE NEGATIVE Final    Comment: (NOTE) The Xpert SA Assay (FDA approved for NASAL specimens in patients 52 years of age and older), is one component of a comprehensive surveillance program. It is not intended to diagnose infection nor to guide or monitor treatment. Performed at [facility], [address], [place], [place] [address]      Time coordinating discharge: 35 minutes  SIGNED:   Eddie North, MD  [facility] 07/08/2018, 2:29 PM Pager   If 7PM-7AM, please contact night-coverage [email or web address] Password [email or web address]

## 2018-07-08 NOTE — Progress Notes (Signed)
Patient has discharged to [facility] on 07/08/2018. Discharge instruction including medication and appointment was placed in DC package. Patient has PICC line for IV antibiotic administration and Foley.

## 2018-07-08 NOTE — Progress Notes (Addendum)
Patient to dc to [facility] room 320.   Accepting MD: Eloisa Northern  RN report number: [id number]  Patient will dc by [facility].   Please call Irving Burton with [facility] with pick up time.   Beulah Gandy [place] CSW [phone]

## 2018-07-08 NOTE — Progress Notes (Signed)
Patient ID: Courtney [name], female   DOB: Jul 11, 1945, 73 y.o.   MRN: [id number]    5 Days Post-Op  Subjective: No new complaints.  Is doing dressing changes without pain medication  Objective: Vital signs in last 24 hours: Temp:  [98.2 F (36.8 C)-99.1 F (37.3 C)] 99.1 F (37.3 C) (03/09 0400) Pulse Rate:  [72-84] 80 (03/09 0300) Resp:  [15-27] 18 (03/09 0400) BP: (128-161)/(31-68) 149/51 (03/09 0400) SpO2:  [92 %-100 %] 99 % (03/09 0300) Last BM Date: 07/07/18  Intake/Output from previous day: 03/08 0701 - 03/09 0700 In: 100 [IV Piggyback:100] Out: 850 [Urine:850] Intake/Output this shift: No intake/output data recorded.  PE: Skin: wound is overall fairly clean.  There is some fibrin tissue on the anterior portion of the wound and a small amount of necortic tissue at the posterior portion of the wound.  Overall, no areas that need further debridement.  No purulent drainage.    Lab Results:  Recent Labs    07/07/18 0509 07/08/18 0459  WBC 17.6* 14.2*  HGB 8.1* 8.1*  HCT 28.7* 28.7*  PLT 470* 464*   BMET Recent Labs    07/07/18 0509 07/08/18 0459  NA 136 137  K 4.2 3.4*  CL 102 103  CO2 30 29  GLUCOSE 105* 130*  BUN 11 11  CREATININE 0.49 0.46  CALCIUM 7.2* 8.3*   PT/INR No results for input(s): LABPROT, INR in the last 72 hours. CMP     Component Value Date/Time   NA 137 07/08/2018 0459   NA 142 10/02/2016 0856   NA 142 08/06/2014 0854   K 3.4 (L) 07/08/2018 0459   K 4.8 08/06/2014 0854   CL 103 07/08/2018 0459   CL 103 01/26/2012 1242   CO2 29 07/08/2018 0459   CO2 29 08/06/2014 0854   GLUCOSE 130 (H) 07/08/2018 0459   GLUCOSE 372 (H) 08/06/2014 0854   GLUCOSE 122 (H) 01/26/2012 1242   BUN 11 07/08/2018 0459   BUN 11 10/02/2016 0856   BUN 12.1 08/06/2014 0854   CREATININE 0.46 07/08/2018 0459   CREATININE 1.1 08/06/2014 0854   CALCIUM 8.3 (L) 07/08/2018 0459   CALCIUM 10.6 (H) 08/06/2014 0854   PROT 5.3 (L) 06/25/2018 1947   PROT 6.7  10/02/2016 0856   PROT 6.9 08/06/2014 0854   ALBUMIN 1.9 (L) 06/25/2018 1947   ALBUMIN 4.1 10/02/2016 0856   ALBUMIN 3.6 08/06/2014 0854   AST 44 (H) 06/25/2018 1947   AST 12 08/06/2014 0854   ALT 50 (H) 06/25/2018 1947   ALT 15 08/06/2014 0854   ALKPHOS 147 (H) 06/25/2018 1947   ALKPHOS 140 08/06/2014 0854   BILITOT 0.4 06/25/2018 1947   BILITOT 0.2 10/02/2016 0856   BILITOT 0.29 08/06/2014 0854   GFRNONAA >60 07/08/2018 0459   GFRAA >60 07/08/2018 0459   Lipase  No results found for: LIPASE     Studies/Results: Vas [place] Lower Extremity Venous (dvt)  Result Date: 07/07/2018  Lower Venous Study Indications: Edema.  Limitations: Body habitus, bandages and significant edema. Comparison Study: No prior study for comparison Performing Technologist: Sherren Kerns RVS  Examination Guidelines: A complete evaluation includes B-mode imaging, spectral Doppler, color Doppler, and power Doppler as needed of all accessible portions of each vessel. Bilateral testing is considered an integral part of a complete examination. Limited examinations for reoccurring indications may be performed as noted.  Right Venous Findings: +---------+---------------+---------+-----------+----------+--------------+          CompressibilityPhasicitySpontaneityPropertiesSummary        +---------+---------------+---------+-----------+----------+--------------+  CFV                                                   Not visualized +---------+---------------+---------+-----------+----------+--------------+ SFJ                                                   Not visualized +---------+---------------+---------+-----------+----------+--------------+ FV Prox  Full                                                        +---------+---------------+---------+-----------+----------+--------------+ FV Mid   Full                                                         +---------+---------------+---------+-----------+----------+--------------+ FV DistalFull                                                        +---------+---------------+---------+-----------+----------+--------------+ PFV                                                   Not visualized +---------+---------------+---------+-----------+----------+--------------+ POP      Full           Yes      Yes                                 +---------+---------------+---------+-----------+----------+--------------+ PTV      Full                                                        +---------+---------------+---------+-----------+----------+--------------+ PERO                                                  Not visualized +---------+---------------+---------+-----------+----------+--------------+  Right Technical Findings: Not visualized segments include common femoral, SFJ, profunda, peroneal veins.  Left Venous Findings: +---------+---------------+---------+-----------+----------+--------------+          CompressibilityPhasicitySpontaneityPropertiesSummary        +---------+---------------+---------+-----------+----------+--------------+ CFV      Full           Yes      Yes                                 +---------+---------------+---------+-----------+----------+--------------+  SFJ      Full                                                        +---------+---------------+---------+-----------+----------+--------------+ FV Prox  Full                                                        +---------+---------------+---------+-----------+----------+--------------+ FV Mid   Full                                                        +---------+---------------+---------+-----------+----------+--------------+ FV DistalFull                                                         +---------+---------------+---------+-----------+----------+--------------+ PFV                                                   Not visualized +---------+---------------+---------+-----------+----------+--------------+ POP      Full                                                        +---------+---------------+---------+-----------+----------+--------------+ PTV      Full                                                        +---------+---------------+---------+-----------+----------+--------------+ PERO                                                  Not visualized +---------+---------------+---------+-----------+----------+--------------+  Left Technical Findings: Not visualized segments include profunda and peroneal veins.  No visualized venous thrombosis, but multiple segments not visualized and limited study.  *See table(s) above for measurements and observations. Electronically signed by Sherald Hess MD on 07/07/2018 at 11:07:56 AM.    Final     Anti-infectives: Anti-infectives (From admission, onward)   Start     Dose/Rate Route Frequency Ordered Stop   06/28/18 1400  metroNIDAZOLE (FLAGYL) tablet 500 mg     500 mg Oral Every 8 hours 06/28/18 1225     06/26/18 2200  vancomycin (VANCOCIN) 1,500 mg in sodium chloride 0.9 % 500 mL IVPB  Status:  Discontinued     1,500 mg 250 mL/hr over 120  Minutes Intravenous Every 24 hours 06/26/18 0446 06/28/18 1225   06/26/18 0600  clindamycin (CLEOCIN) IVPB 600 mg  Status:  Discontinued     600 mg 100 mL/hr over 30 Minutes Intravenous Every 8 hours 06/26/18 0508 06/28/18 1225   06/26/18 0415  clindamycin (CLEOCIN) IVPB 300 mg  Status:  Discontinued     300 mg 100 mL/hr over 30 Minutes Intravenous Every 8 hours 06/26/18 0356 06/26/18 0508   06/26/18 0400  cefTRIAXone (ROCEPHIN) 2 g in sodium chloride 0.9 % 100 mL IVPB  Status:  Discontinued     2 g 200 mL/hr over 30 Minutes Intravenous Every 24 hours 06/26/18 0356  06/26/18 0409   06/25/18 2330  vancomycin (VANCOCIN) IVPB 1000 mg/200 mL premix     1,000 mg 200 mL/hr over 60 Minutes Intravenous  Once 06/25/18 1958 06/26/18 0015   06/25/18 1500  clindamycin (CLEOCIN) IVPB 600 mg     600 mg 100 mL/hr over 30 Minutes Intravenous  Once 06/25/18 1453 06/25/18 1657   06/25/18 1330  vancomycin (VANCOCIN) IVPB 1000 mg/200 mL premix     1,000 mg 200 mL/hr over 60 Minutes Intravenous  Once 06/25/18 1318 06/25/18 1606   06/25/18 1330  cefTRIAXone (ROCEPHIN) 2 g in sodium chloride 0.9 % 100 mL IVPB     2 g 200 mL/hr over 30 Minutes Intravenous Every 24 hours 06/25/18 1318         Assessment/Plan  Uncontrolled DM- A1c 12.1 HTN HLD CAD H/o STEMI 2011 s/p RCA stent COPD Acute on chronic anemia  Sepsis Necrotizing soft tissue infection multiple debridements S/p incision and drainage perirectal abscess 2/25 Dr. Maisie Fus S/p EXCISIONAL DEBRIDEMENT 2/27 Dr. Maisie Fus S/pEXCISIONAL DEBRIDEMENT, dressing change3/2 Dr. Dwain Sarna S/pexcisional debridement,dressing change3/4 Dr. Dwain Sarna - intraop cultures:RARE ESCHERICHIA COLI, resistant to cipro only - appreciate ID assistance with abx - BID wet to dry dressing change -no further OR needed.  Any fibrinous tissue present, should resolve with dressing changes -stable for DC to LTAC from our standpoint ID -rocephin/flagyl2/25>>, clindamycin 2/25>>2/28, vancomycin 2/25>>2/28 VTE -SCDs,lovenox FEN -IVF,CM diet Foley - in place for retention Follow up -Dr. Kelli Churn   LOS: 13 days    Letha Cape , [facility] 07/08/2018, 9:34 AM Pager: [phone]

## 2018-07-17 ENCOUNTER — Other Ambulatory Visit: Payer: Medicare Other

## 2018-07-17 ENCOUNTER — Ambulatory Visit: Payer: Medicare Other | Admitting: Oncology

## 2018-11-01 ENCOUNTER — Emergency Department (HOSPITAL_COMMUNITY)
Admission: EM | Admit: 2018-11-01 | Discharge: 2018-11-01 | Disposition: A | Discharge status: Skilled Nursing Facility | Payer: Medicare Other | Attending: Emergency Medicine | Admitting: Emergency Medicine

## 2018-11-01 ENCOUNTER — Emergency Department (HOSPITAL_COMMUNITY): Payer: Medicare Other

## 2018-11-01 DIAGNOSIS — I2510 Atherosclerotic heart disease of native coronary artery without angina pectoris: Secondary | ICD-10-CM | POA: Insufficient documentation

## 2018-11-01 DIAGNOSIS — S0990XA Unspecified injury of head, initial encounter: Secondary | ICD-10-CM | POA: Insufficient documentation

## 2018-11-01 DIAGNOSIS — Y999 Unspecified external cause status: Secondary | ICD-10-CM | POA: Insufficient documentation

## 2018-11-01 DIAGNOSIS — W06XXXA Fall from bed, initial encounter: Secondary | ICD-10-CM | POA: Diagnosis not present

## 2018-11-01 DIAGNOSIS — Z79899 Other long term (current) drug therapy: Secondary | ICD-10-CM | POA: Diagnosis not present

## 2018-11-01 DIAGNOSIS — E876 Hypokalemia: Secondary | ICD-10-CM | POA: Diagnosis not present

## 2018-11-01 DIAGNOSIS — J449 Chronic obstructive pulmonary disease, unspecified: Secondary | ICD-10-CM | POA: Insufficient documentation

## 2018-11-01 DIAGNOSIS — Y92009 Unspecified place in unspecified non-institutional (private) residence as the place of occurrence of the external cause: Secondary | ICD-10-CM | POA: Insufficient documentation

## 2018-11-01 DIAGNOSIS — Y939 Activity, unspecified: Secondary | ICD-10-CM | POA: Insufficient documentation

## 2018-11-01 DIAGNOSIS — J45909 Unspecified asthma, uncomplicated: Secondary | ICD-10-CM | POA: Diagnosis not present

## 2018-11-01 DIAGNOSIS — F17200 Nicotine dependence, unspecified, uncomplicated: Secondary | ICD-10-CM | POA: Insufficient documentation

## 2018-11-01 DIAGNOSIS — W19XXXA Unspecified fall, initial encounter: Secondary | ICD-10-CM

## 2018-11-01 DIAGNOSIS — I10 Essential (primary) hypertension: Secondary | ICD-10-CM | POA: Diagnosis not present

## 2018-11-01 LAB — CBC WITH DIFFERENTIAL/PLATELET
Abs Immature Granulocytes: 0.06 10*3/uL (ref 0.00–0.07)
Basophils Absolute: 0 10*3/uL (ref 0.0–0.1)
Basophils Relative: 0 %
Eosinophils Absolute: 0 10*3/uL (ref 0.0–0.5)
Eosinophils Relative: 0 %
HCT: 31.3 % — ABNORMAL LOW (ref 36.0–46.0)
Hemoglobin: 8.4 g/dL — ABNORMAL LOW (ref 12.0–15.0)
Immature Granulocytes: 1 %
Lymphocytes Relative: 18 %
Lymphs Abs: 2 10*3/uL (ref 0.7–4.0)
MCH: 20 pg — ABNORMAL LOW (ref 26.0–34.0)
MCHC: 26.8 g/dL — ABNORMAL LOW (ref 30.0–36.0)
MCV: 74.5 fL — ABNORMAL LOW (ref 80.0–100.0)
Monocytes Absolute: 0.8 10*3/uL (ref 0.1–1.0)
Monocytes Relative: 7 %
Neutro Abs: 8.6 10*3/uL — ABNORMAL HIGH (ref 1.7–7.7)
Neutrophils Relative %: 74 %
Platelets: 335 10*3/uL (ref 150–400)
RBC: 4.2 MIL/uL (ref 3.87–5.11)
RDW: 16.1 % — ABNORMAL HIGH (ref 11.5–15.5)
WBC: 11.5 10*3/uL — ABNORMAL HIGH (ref 4.0–10.5)
nRBC: 0 % (ref 0.0–0.2)

## 2018-11-01 LAB — BASIC METABOLIC PANEL
Anion gap: 8 (ref 5–15)
BUN: 9 mg/dL (ref 8–23)
CO2: 26 mmol/L (ref 22–32)
Calcium: 9.6 mg/dL (ref 8.9–10.3)
Chloride: 107 mmol/L (ref 98–111)
Creatinine, Ser: 0.36 mg/dL — ABNORMAL LOW (ref 0.44–1.00)
GFR calc Af Amer: >60 mL/min (ref >60)
GFR calc non Af Amer: >60 mL/min (ref >60)
Glucose, Bld: 129 mg/dL — ABNORMAL HIGH (ref 70–99)
Potassium: 3.1 mmol/L — ABNORMAL LOW (ref 3.5–5.1)
Sodium: 141 mmol/L (ref 135–145)

## 2018-11-01 LAB — CK: Total CK: 25 U/L — ABNORMAL LOW (ref 38–234)

## 2018-11-01 MED ORDER — POTASSIUM CHLORIDE ER 10 MEQ PO TBCR: 30.0000 meq | EXTENDED_RELEASE_TABLET | Freq: Every day | ORAL | 0 refills | Status: DC

## 2018-11-01 MED ORDER — POTASSIUM CHLORIDE CRYS ER 20 MEQ PO TBCR
40.0000 meq | EXTENDED_RELEASE_TABLET | Freq: Once | ORAL | Status: AC
  Administered 2018-11-01: 40 meq via ORAL
  Filled 2018-11-01: qty 2

## 2018-11-01 NOTE — ED Notes (Signed)
Patient transported to CT 

## 2018-11-01 NOTE — Discharge Instructions (Signed)
Follow-up with your primary care provider. Have your potassium rechecked in 1 week. The x-ray of your ribs and hip today were negative for fracture.  CT and MRI of your head did not show any new concerning findings. Take your home medications as previously prescribed. Return to the ED if you start to have worsening symptoms, additional injuries or falls, numbness in your arms or legs or blurry vision.

## 2018-11-01 NOTE — ED Notes (Signed)
[facility] has been contacted regarding patient transport.  

## 2018-11-01 NOTE — ED Provider Notes (Signed)
[facility]-EMERGENCY DEPT Provider Note   CSN: [id number] Arrival date & time: 11/01/18  [id number]    History   [id number] Complaint Chief Complaint  Patient presents with   Fall    HPI Steele SizerBarbara D Wardle is a 73 y.o. female with a past medical history of hypertension, diabetes, CAD, COPD who presents to ED for evaluation of fall that occurred approximately 11 hours ago.  States that she was trying to get out of bed.  She got to the edge of the bed" when I tried to roll over I just fell to the ground."  She reports having a head injury.  She called for help but no one heard her.  She is unsure exactly how long she was on the ground.  She states that "I kind of nodded off."  States that she has stood up but was unable to ambulate due to pain.  She is usually ambulatory.  She reports pain in her right posterior rib area.  Denies any neck pain, back pain, loss of bowel or bladder function, headache, vision changes, vomiting, numbness in arms or legs. She is not on any blood thinning medication.     HPI  Past Medical History:  Diagnosis Date   Anemia    Arthritis    Asthma    CAD (coronary artery disease)    COPD (chronic obstructive pulmonary disease) (HCC)    Diabetes mellitus    Hyperlipidemia    Hypertension    Myocardial infarct (HCC)    Postmenopausal atrophic vaginitis    Restless leg syndrome     Patient Active Problem List   Diagnosis Date Noted   Uncontrolled hypertension 07/08/2018   Obesity, Class III, BMI 40-49.9 (morbid obesity) (HCC) 07/08/2018   Sepsis (HCC)    Diabetes mellitus due to underlying condition, uncontrolled, with hyperglycemia, with long-term current use of insulin (HCC) 06/28/2018   Cigarette smoker 06/28/2018   OSA and COPD overlap syndrome (HCC) 06/26/2018   Pressure injury of skin 06/26/2018   Fournier's gangrene in female 06/25/2018   Hypertensive heart disease 08/21/2016   Normocytic anemia    Old MI  (myocardial infarction)    HLD (hyperlipidemia)    Postmenopausal atrophic vaginitis    Restless leg syndrome    COPD (chronic obstructive pulmonary disease) (HCC)    Arthritis    CAD (coronary artery disease)    Iron (Fe) deficiency anemia 04/05/2011    Past Surgical History:  Procedure Laterality Date   CHOLECYSTECTOMY  1970's   INCISION AND DRAINAGE ABSCESS Right 06/27/2018   Procedure: 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.;  Surgeon: Romie Leveehomas, Alicia, MD;  Location: [facility];  Service: General;  Laterality: Right;   INCISION AND DRAINAGE PERIRECTAL ABSCESS N/A 06/25/2018   Procedure: IRRIGATION AND DEBRIDEMENT PERIRECTAL ABCESS;  Surgeon: Romie Leveehomas, Alicia, MD;  Location: [facility];  Service: General;  Laterality: N/A;   IRRIGATION AND DEBRIDEMENT ABSCESS N/A 07/01/2018   Procedure: IRRIGATION AND DEBRIDEMENT ABSCESS;  Surgeon: Emelia LoronWakefield, Matthew, MD;  Location: [facility];  Service: General;  Laterality: N/A;   OTHER SURGICAL HISTORY  1970's   hysterectomy   WOUND DEBRIDEMENT N/A 07/03/2018   Procedure: DEBRIDEMENT PELVIS WITH DRESSING CHANGE;  Surgeon: Emelia LoronWakefield, Matthew, MD;  Location: [facility];  Service: General;  Laterality: N/A;     OB History   No obstetric history on file.      Home Medications    Prior to Admission medications   Medication Sig Start Date End Date Taking?  Authorizing Provider  acetaminophen (TYLENOL) 650 MG CR tablet Take 650 mg by mouth every 6 (six) hours as needed for pain.   Yes [provider]  amLODipine (NORVASC) 10 MG tablet Take 1 tablet (10 mg total) by mouth daily. 07/09/18  Yes Dhungel, Nishant, MD  atorvastatin (LIPITOR) 80 MG tablet Take 80 mg by mouth at bedtime.   Yes [provider]  ciprofloxacin (CIPRO) 400 MG/200ML SOLN Inject 400 mg into the vein daily.   Yes [provider]  enoxaparin (LOVENOX) 40 MG/0.4ML injection Inject 40 mg into the skin daily.   Yes [provider]    famotidine (PEPCID) 20 MG tablet Take 20 mg by mouth 2 (two) times daily.   Yes [provider]  insulin aspart (NOVOLOG) 100 UNIT/ML injection Inject 2-10 Units into the skin 3 (three) times daily before meals. Per sliding scale   Yes [provider]  LANTUS SOLOSTAR 100 UNIT/ML Solostar Pen Inject 10 Units into the skin at bedtime.  04/16/18  Yes [provider]  memantine (NAMENDA) 5 MG tablet Take 5 mg by mouth at bedtime.   Yes [provider]  metoprolol succinate (TOPROL-XL) 25 MG 24 hr tablet Take 25 mg by mouth daily.   Yes [provider]  polyethylene glycol (MIRALAX / GLYCOLAX) 17 g packet Take 17 g by mouth daily.   Yes [provider]  potassium chloride SA (K-DUR) 20 MEQ tablet Take 40 mEq by mouth daily.   Yes [provider]  saccharomyces boulardii (FLORASTOR) 250 MG capsule Take 250 mg by mouth 2 (two) times daily.   Yes [provider]  senna (SENOKOT) 8.6 MG TABS tablet Take 2 tablets by mouth daily.   Yes [provider]  sertraline (ZOLOFT) 25 MG tablet Take 25 mg by mouth daily.   Yes [provider]  sodium hypochlorite (DAKIN'S 1/2 STRENGTH) external solution Irrigate with 1 application as directed daily.   Yes [provider]  aspirin EC 81 MG EC tablet Take 1 tablet (81 mg total) by mouth daily. Patient not taking: Reported on 11/01/2018 07/09/18   Dhungel, Theda BelfastNishant, MD  Ensure Max Protein (ENSURE MAX PROTEIN) LIQD Take 330 mLs (11 oz total) by mouth 2 (two) times daily. Patient not taking: Reported on 11/01/2018 07/08/18   Dhungel, Nishant, MD  ipratropium-albuterol (DUONEB) 0.5-2.5 (3) MG/3ML SOLN Take 3 mLs by nebulization every 6 (six) hours as needed. Patient not taking: Reported on 11/01/2018 07/08/18   Dhungel, Theda BelfastNishant, MD  lisinopril (PRINIVIL,ZESTRIL) 10 MG tablet Take 1 tablet (10 mg total) by mouth daily for 30 days. Patient not taking: Reported on 11/01/2018 07/08/18 08/07/18   Dhungel, Theda BelfastNishant, MD  metoprolol succinate (TOPROL-XL) 50 MG 24 hr tablet Take 1 tablet (50 mg total) by mouth daily. Patient not taking: Reported on 11/01/2018 07/08/18   Dhungel, Theda BelfastNishant, MD  nutrition supplement, JUVEN, (JUVEN) PACK Take 1 packet by mouth 2 (two) times daily between meals. Patient not taking: Reported on 11/01/2018 07/09/18   Dhungel, Theda BelfastNishant, MD  oxyCODONE (OXY IR/ROXICODONE) 5 MG immediate release tablet Take 1 tablet (5 mg total) by mouth every 4 (four) hours as needed for moderate pain. Patient not taking: Reported on 11/01/2018 07/08/18   Dhungel, Theda BelfastNishant, MD  potassium chloride (K-DUR) 10 MEQ tablet Take 3 tablets (30 mEq total) by mouth daily for 2 days. 11/01/18 11/03/18  Josue Kass, PA-C  rosuvastatin (CRESTOR) 20 MG tablet Take 1 tablet (20 mg total) by mouth daily. Patient not  taking: Reported on 11/01/2018 09/05/16 12/04/16  Jettie Booze, MD    Family History Family History  Problem Relation Age of Onset   Hypertension Brother    Heart attack Neg Hx     Social History Social History   Tobacco Use   Smoking status: Current Every Day Smoker    Packs/day: 0.50   Smokeless tobacco: Never Used  Substance Use Topics   Alcohol use: No   Drug use: No     Allergies   Eggs or egg-derived products and Penicillins   Review of Systems Review of Systems  Constitutional: Negative for appetite change, chills and fever.  HENT: Negative for ear pain, rhinorrhea, sneezing and sore throat.   Eyes: Negative for photophobia and visual disturbance.  Respiratory: Negative for cough, chest tightness, shortness of breath and wheezing.   Cardiovascular: Negative for chest pain and palpitations.  Gastrointestinal: Negative for abdominal pain, blood in stool, constipation, diarrhea, nausea and vomiting.  Genitourinary: Negative for dysuria, hematuria and urgency.  Musculoskeletal: Positive for myalgias.  Skin: Negative for rash.  Neurological: Negative for dizziness,  weakness and light-headedness.     Physical Exam Updated Vital Signs BP (!) 154/88    Pulse 84    Temp 97.7 F (36.5 C) (Oral)    Resp (!) 24    SpO2 100%   Physical Exam Vitals signs and nursing note reviewed.  Constitutional:      General: She is not in acute distress.    Appearance: She is well-developed.  HENT:     Head: Normocephalic and atraumatic.     Nose: Nose normal.  Eyes:     General: No scleral icterus.       Right eye: No discharge.        Left eye: No discharge.     Conjunctiva/sclera: Conjunctivae normal.     Pupils: Pupils are equal, round, and reactive to light.  Neck:     Musculoskeletal: Normal range of motion and neck supple.  Cardiovascular:     Rate and Rhythm: Normal rate and regular rhythm.     Heart sounds: Normal heart sounds. No murmur. No friction rub. No gallop.   Pulmonary:     Effort: Pulmonary effort is normal. No respiratory distress.     Breath sounds: Normal breath sounds.    Abdominal:     General: Bowel sounds are normal. There is no distension.     Palpations: Abdomen is soft.     Tenderness: There is no abdominal tenderness. There is no guarding.  Musculoskeletal: Normal range of motion.     Comments: No midline spinal tenderness present in lumbar, thoracic or cervical spine. No step-off palpated. No visible bruising, edema or temperature change noted. No objective signs of numbness present. No saddle anesthesia. 2+ DP pulses bilaterally. Sensation intact to light touch. Strength 5/5 in bilateral lower extremities.  Skin:    General: Skin is warm and dry.     Findings: No rash.  Neurological:     General: No focal deficit present.     Mental Status: She is alert and oriented to person, place, and time.     Cranial Nerves: No cranial nerve deficit.     Sensory: No sensory deficit.     Motor: No weakness or abnormal muscle tone.     Coordination: Coordination normal.      ED Treatments / Results  Labs (all labs ordered are  listed, but only abnormal results are displayed) Labs Reviewed  BASIC  METABOLIC PANEL - Abnormal; Notable for the following components:      Result Value   Potassium 3.1 (*)    Glucose, Bld 129 (*)    Creatinine, Ser 0.36 (*)    All other components within normal limits  CBC WITH DIFFERENTIAL/PLATELET - Abnormal; Notable for the following components:   WBC 11.5 (*)    Hemoglobin 8.4 (*)    HCT 31.3 (*)    MCV 74.5 (*)    MCH 20.0 (*)    MCHC 26.8 (*)    RDW 16.1 (*)    Neutro Abs 8.6 (*)    All other components within normal limits  CK - Abnormal; Notable for the following components:   Total CK 25 (*)    All other components within normal limits    EKG EKG Interpretation  Date/Time:  Friday November 01 2018 10:07:08 EDT Ventricular Rate:  70 PR Interval:    QRS Duration: 117 QT Interval:  415 QTC Calculation: 448 R Axis:   26 Text Interpretation:  Sinus rhythm Atrial premature complex Nonspecific intraventricular conduction delay When compared to prior, less PVC than prior.  NO sTEMI Confirmed by Theda Belfast ([id number]) on 11/01/2018 12:40:23 PM   Radiology Dg Ribs Unilateral W/chest Right  Result Date: 11/01/2018 CLINICAL DATA:  Status post fall last night with right rib pain. EXAM: RIGHT RIBS AND CHEST - 3+ VIEW COMPARISON:  Chest x-ray June 25, 2018 FINDINGS: No fracture or dislocation are seen involving the ribs. There is no evidence of pneumothorax or pleural effusion. Both lungs are clear. Right central venous line is identified. The heart size is enlarged. IMPRESSION: No acute fracture or dislocation of right ribs. Electronically Signed   By: Sherian Rein M.D.   On: 11/01/2018 08:48   Ct Head Wo Contrast  Result Date: 11/01/2018 CLINICAL DATA:  Status post fall last night injuring right side of head. EXAM: CT HEAD WITHOUT CONTRAST TECHNIQUE: Contiguous axial images were obtained from the base of the skull through the vertex without intravenous contrast. COMPARISON:  MRI  of head June 21, 2006 FINDINGS: Brain: There is subtle loss of gray-white differentiation in the right frontal lobe image 22, series 2. There is chronic diffuse atrophy. There is no hydrocephalus. No acute hemorrhage is identified. There is no midline shift. Bilateral periventricular white matter small vessel ischemic changes noted. Vascular: No hyperdense vessel is noted. Skull: Normal. Negative for fracture or focal lesion. Sinuses/Orbits: No acute finding. Other: None. IMPRESSION: Subtle loss of gray-white differentiation in the right frontal lobe as described. Developing infarct is not excluded. No acute hemorrhage. Chronic diffuse atrophy and chronic bilateral periventricular white matter small vessel ischemic change. Electronically Signed   By: Sherian Rein M.D.   On: 11/01/2018 08:42   Mr Brain Wo Contrast  Result Date: 11/01/2018 CLINICAL DATA:  Larey Seat today with head trauma. EXAM: MRI HEAD WITHOUT CONTRAST TECHNIQUE: Multiplanar, multiecho pulse sequences of the brain and surrounding structures were obtained without intravenous contrast. COMPARISON:  CT same day FINDINGS: Brain: The study suffers from motion degradation. Diffusion imaging does not show any acute or subacute infarction. Brain shows generalized atrophy with old infarction in the right basal ganglia and right frontal cortical and subcortical brain. Small-vessel ischemic changes seen elsewhere within the cerebral hemispheric white matter. No mass lesion, hemorrhage, hydrocephalus or extra-axial collection. Vascular: Major vessels at the base of the brain show flow. Skull and upper cervical spine: Negative Sinuses/Orbits: Clear/negative Other: None IMPRESSION: No acute finding by MRI. The  study suffers from considerable motion degradation but I do not believe there is any recent stroke. 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 Electronically Signed   By: Paulina Fusi M.D.   On:  11/01/2018 14:24   Dg Hip Unilat W Or Wo Pelvis 2-3 Views Right  Result Date: 11/01/2018 CLINICAL DATA:  Status post fall with right hip pain. EXAM: DG HIP (WITH OR WITHOUT PELVIS) 2-3V RIGHT COMPARISON:  None. FINDINGS: There is no evidence of hip fracture or dislocation of right hip. Degenerative joint changes of bilateral hips with narrowed joint space are noted. IMPRESSION: No acute fracture or dislocation of right hip Electronically Signed   By: Sherian Rein M.D.   On: 11/01/2018 13:25    Procedures Procedures (including critical care time)  Medications Ordered in ED Medications  potassium chloride SA (K-DUR) CR tablet 40 mEq (40 mEq Oral Given 11/01/18 0959)     Initial Impression / Assessment and Plan / ED Course  I have reviewed the triage vital signs and the nursing notes.  Pertinent labs & imaging results that were available during my care of the patient were reviewed by me and considered in my medical decision making (see chart for details).        73 year old female with a past medical history of hypertension, diabetes, CAD, COPD who is trying to get out of bed when she rolled over onto the ground.  She reports head injury and a long downtime until stone was able to help her.  She reports pain in right posterior rib area.  Denies neck pain, back pain, headache, vision changes or vomiting, urinary symptoms.  On exam there is tenderness palpation of the right posterior ribs.  No bruising, midline C,T,L and is to palpation.  No deficits neurological exam noted.  Abdomen is soft, nontender nondistended.  Lab work significant for hypokalemia of 3.1.  Hemoglobin at baseline.  CK is unremarkable.  X-ray of the hip and ribs are unremarkable.  CT of the head shows possible infarct.  MRI is unremarkable however.  Patient able to tolerate p.o. intake without difficulty prior to discharge.  Potassium was repleted orally and she will be sent home with potassium prescription for the next few  days.  Patient advised to follow-up with PCP and return for worsening symptoms.  Patient is hemodynamically stable, in NAD. Evaluation does not show pathology that would require ongoing emergent intervention or inpatient treatment. I explained the diagnosis to the patient. Pain has been managed and has no complaints prior to discharge. Patient is comfortable with above plan and is stable for discharge at this time. All questions were answered prior to disposition. Strict return precautions for returning to the ED were discussed. Encouraged follow up with PCP.   An After Visit Summary was printed and given to the patient.   Portions of this note were generated with Scientist, clinical (histocompatibility and immunogenetics). Dictation errors may occur despite best attempts at proofreading.   Final Clinical Impressions(s) / ED Diagnoses   Final diagnoses:  Fall in home, initial encounter  Injury of head, initial encounter  Hypokalemia    ED Discharge Orders         Ordered    potassium chloride (K-DUR) 10 MEQ tablet  Daily     11/01/18 1444           Dietrich Pates, PA-C 11/01/18 1447    Tegeler, Canary Brim, MD 11/01/18 1556

## 2018-11-01 NOTE — ED Notes (Signed)
Bed: WA05 Expected date:  Expected time:  Means of arrival:  Comments: EMS-fall 

## 2018-11-01 NOTE — ED Triage Notes (Signed)
Transported by [facility] from [facility]-- experienced a fall last night injuring right side and right side of head. Patient reports the fall occurred last night around 2115 and she laid there for 3 hours. AAO x 4. +hematoma to right side of head.

## 2018-11-01 NOTE — ED Notes (Signed)
ED Provider at bedside. 

## 2019-10-18 ENCOUNTER — Inpatient Hospital Stay (HOSPITAL_COMMUNITY)
Admission: EM | Admit: 2019-10-18 | Discharge: 2019-10-21 | DRG: 190 | Disposition: A | Discharge status: Skilled Nursing Facility | Payer: Medicare (Managed Care) | Source: Skilled Nursing Facility | Attending: Family Medicine | Admitting: Family Medicine

## 2019-10-18 ENCOUNTER — Other Ambulatory Visit: Payer: Self-pay

## 2019-10-18 ENCOUNTER — Encounter (HOSPITAL_COMMUNITY): Payer: Self-pay

## 2019-10-18 ENCOUNTER — Inpatient Hospital Stay (HOSPITAL_COMMUNITY): Payer: Medicare (Managed Care)

## 2019-10-18 ENCOUNTER — Emergency Department (HOSPITAL_COMMUNITY): Payer: Medicare (Managed Care)

## 2019-10-18 DIAGNOSIS — I5023 Acute on chronic systolic (congestive) heart failure: Secondary | ICD-10-CM | POA: Diagnosis present

## 2019-10-18 DIAGNOSIS — I509 Heart failure, unspecified: Secondary | ICD-10-CM

## 2019-10-18 DIAGNOSIS — I2510 Atherosclerotic heart disease of native coronary artery without angina pectoris: Secondary | ICD-10-CM | POA: Diagnosis present

## 2019-10-18 DIAGNOSIS — E875 Hyperkalemia: Secondary | ICD-10-CM

## 2019-10-18 DIAGNOSIS — Z79899 Other long term (current) drug therapy: Secondary | ICD-10-CM | POA: Diagnosis not present

## 2019-10-18 DIAGNOSIS — T501X5A Adverse effect of loop [high-ceiling] diuretics, initial encounter: Secondary | ICD-10-CM | POA: Diagnosis present

## 2019-10-18 DIAGNOSIS — Z794 Long term (current) use of insulin: Secondary | ICD-10-CM

## 2019-10-18 DIAGNOSIS — F17210 Nicotine dependence, cigarettes, uncomplicated: Secondary | ICD-10-CM | POA: Diagnosis present

## 2019-10-18 DIAGNOSIS — Z66 Do not resuscitate: Secondary | ICD-10-CM | POA: Diagnosis present

## 2019-10-18 DIAGNOSIS — I252 Old myocardial infarction: Secondary | ICD-10-CM | POA: Diagnosis not present

## 2019-10-18 DIAGNOSIS — J45901 Unspecified asthma with (acute) exacerbation: Secondary | ICD-10-CM | POA: Diagnosis present

## 2019-10-18 DIAGNOSIS — T380X5A Adverse effect of glucocorticoids and synthetic analogues, initial encounter: Secondary | ICD-10-CM | POA: Diagnosis present

## 2019-10-18 DIAGNOSIS — E785 Hyperlipidemia, unspecified: Secondary | ICD-10-CM | POA: Diagnosis present

## 2019-10-18 DIAGNOSIS — G4733 Obstructive sleep apnea (adult) (pediatric): Secondary | ICD-10-CM | POA: Diagnosis present

## 2019-10-18 DIAGNOSIS — K5900 Constipation, unspecified: Secondary | ICD-10-CM | POA: Diagnosis present

## 2019-10-18 DIAGNOSIS — Z7951 Long term (current) use of inhaled steroids: Secondary | ICD-10-CM | POA: Diagnosis not present

## 2019-10-18 DIAGNOSIS — J189 Pneumonia, unspecified organism: Secondary | ICD-10-CM

## 2019-10-18 DIAGNOSIS — D509 Iron deficiency anemia, unspecified: Secondary | ICD-10-CM | POA: Diagnosis present

## 2019-10-18 DIAGNOSIS — J440 Chronic obstructive pulmonary disease with acute lower respiratory infection: Secondary | ICD-10-CM | POA: Diagnosis present

## 2019-10-18 DIAGNOSIS — Z20822 Contact with and (suspected) exposure to covid-19: Secondary | ICD-10-CM | POA: Diagnosis present

## 2019-10-18 DIAGNOSIS — G2581 Restless legs syndrome: Secondary | ICD-10-CM | POA: Diagnosis present

## 2019-10-18 DIAGNOSIS — Z6841 Body Mass Index (BMI) 40.0 and over, adult: Secondary | ICD-10-CM | POA: Diagnosis not present

## 2019-10-18 DIAGNOSIS — Z933 Colostomy status: Secondary | ICD-10-CM

## 2019-10-18 DIAGNOSIS — I110 Hypertensive heart disease with heart failure: Secondary | ICD-10-CM | POA: Diagnosis present

## 2019-10-18 DIAGNOSIS — J441 Chronic obstructive pulmonary disease with (acute) exacerbation: Principal | ICD-10-CM

## 2019-10-18 DIAGNOSIS — Z9049 Acquired absence of other specified parts of digestive tract: Secondary | ICD-10-CM

## 2019-10-18 DIAGNOSIS — J9621 Acute and chronic respiratory failure with hypoxia: Secondary | ICD-10-CM | POA: Diagnosis present

## 2019-10-18 DIAGNOSIS — I5021 Acute systolic (congestive) heart failure: Secondary | ICD-10-CM

## 2019-10-18 DIAGNOSIS — E1165 Type 2 diabetes mellitus with hyperglycemia: Secondary | ICD-10-CM | POA: Diagnosis present

## 2019-10-18 DIAGNOSIS — Z7982 Long term (current) use of aspirin: Secondary | ICD-10-CM

## 2019-10-18 DIAGNOSIS — Z955 Presence of coronary angioplasty implant and graft: Secondary | ICD-10-CM

## 2019-10-18 DIAGNOSIS — R0602 Shortness of breath: Secondary | ICD-10-CM | POA: Diagnosis not present

## 2019-10-18 LAB — BASIC METABOLIC PANEL
Anion gap: 4 — ABNORMAL LOW (ref 5–15)
Anion gap: 5 (ref 5–15)
BUN: 25 mg/dL — ABNORMAL HIGH (ref 8–23)
BUN: 22 mg/dL (ref 8–23)
CO2: 28 mmol/L (ref 22–32)
CO2: 24 mmol/L (ref 22–32)
Calcium: 9.3 mg/dL (ref 8.9–10.3)
Calcium: 8.1 mg/dL — ABNORMAL LOW (ref 8.9–10.3)
Chloride: 107 mmol/L (ref 98–111)
Chloride: 112 mmol/L — ABNORMAL HIGH (ref 98–111)
Creatinine, Ser: 0.76 mg/dL (ref 0.44–1.00)
Creatinine, Ser: 0.46 mg/dL (ref 0.44–1.00)
GFR calc Af Amer: >60 mL/min (ref >60)
GFR calc Af Amer: >60 mL/min (ref >60)
GFR calc non Af Amer: >60 mL/min (ref >60)
GFR calc non Af Amer: >60 mL/min (ref >60)
Glucose, Bld: 142 mg/dL — ABNORMAL HIGH (ref 70–99)
Glucose, Bld: 190 mg/dL — ABNORMAL HIGH (ref 70–99)
Potassium: 5.9 mmol/L — ABNORMAL HIGH (ref 3.5–5.1)
Potassium: 4 mmol/L (ref 3.5–5.1)
Sodium: 139 mmol/L (ref 135–145)
Sodium: 141 mmol/L (ref 135–145)

## 2019-10-18 LAB — GLUCOSE, CAPILLARY
Glucose-Capillary: 208 mg/dL — ABNORMAL HIGH (ref 70–99)
Glucose-Capillary: 271 mg/dL — ABNORMAL HIGH (ref 70–99)
Glucose-Capillary: 249 mg/dL — ABNORMAL HIGH (ref 70–99)
Glucose-Capillary: 327 mg/dL — ABNORMAL HIGH (ref 70–99)

## 2019-10-18 LAB — CBC
HCT: 33.3 % — ABNORMAL LOW (ref 36.0–46.0)
Hemoglobin: 9.2 g/dL — ABNORMAL LOW (ref 12.0–15.0)
MCH: 22.4 pg — ABNORMAL LOW (ref 26.0–34.0)
MCHC: 27.6 g/dL — ABNORMAL LOW (ref 30.0–36.0)
MCV: 81.2 fL (ref 80.0–100.0)
Platelets: 189 10*3/uL (ref 150–400)
RBC: 4.1 MIL/uL (ref 3.87–5.11)
RDW: 15.4 % (ref 11.5–15.5)
WBC: 7.1 10*3/uL (ref 4.0–10.5)
nRBC: 0.7 % — ABNORMAL HIGH (ref 0.0–0.2)

## 2019-10-18 LAB — SARS CORONAVIRUS 2 BY RT PCR (HOSPITAL ORDER, PERFORMED IN ~~LOC~~ HOSPITAL LAB): SARS Coronavirus 2: NEGATIVE

## 2019-10-18 LAB — BLOOD GAS, ARTERIAL
Acid-Base Excess: 0.6 mmol/L (ref 0.0–2.0)
Bicarbonate: 27.2 mmol/L (ref 20.0–28.0)
Drawn by: 225631
FIO2: 28
O2 Saturation: 86.8 %
Patient temperature: 98.6
pCO2 arterial: 58 mmHg — ABNORMAL HIGH (ref 32.0–48.0)
pH, Arterial: 7.293 — ABNORMAL LOW (ref 7.350–7.450)
pO2, Arterial: 60.1 mmHg — ABNORMAL LOW (ref 83.0–108.0)

## 2019-10-18 LAB — PROCALCITONIN: Procalcitonin: <0.1 ng/mL

## 2019-10-18 LAB — HEMOGLOBIN A1C
Hgb A1c MFr Bld: 7.8 % — ABNORMAL HIGH (ref 4.8–5.6)
Mean Plasma Glucose: 177.16 mg/dL

## 2019-10-18 LAB — ECHOCARDIOGRAM COMPLETE
Height: 62 in
Weight: 3410.96 oz

## 2019-10-18 LAB — BRAIN NATRIURETIC PEPTIDE: B Natriuretic Peptide: 403.5 pg/mL — ABNORMAL HIGH (ref 0.0–100.0)

## 2019-10-18 LAB — TROPONIN I (HIGH SENSITIVITY)
Troponin I (High Sensitivity): 9 ng/L (ref <18)
Troponin I (High Sensitivity): 7 ng/L (ref <18)

## 2019-10-18 LAB — MRSA PCR SCREENING: MRSA by PCR: NEGATIVE

## 2019-10-18 MED ORDER — SODIUM CHLORIDE 0.9 % IV SOLN
500.0000 mg | Freq: Once | INTRAVENOUS | Status: AC
  Administered 2019-10-18: 500 mg via INTRAVENOUS
  Filled 2019-10-18: qty 500

## 2019-10-18 MED ORDER — IPRATROPIUM-ALBUTEROL 0.5-2.5 (3) MG/3ML IN SOLN
3.0000 mL | Freq: Four times a day (QID) | RESPIRATORY_TRACT | Status: DC
  Administered 2019-10-18 – 2019-10-19 (×4): 3 mL via RESPIRATORY_TRACT
  Filled 2019-10-18 (×5): qty 3

## 2019-10-18 MED ORDER — ALBUTEROL SULFATE HFA 108 (90 BASE) MCG/ACT IN AERS
2.0000 | INHALATION_SPRAY | RESPIRATORY_TRACT | Status: AC
  Administered 2019-10-18 (×3): 2 via RESPIRATORY_TRACT
  Filled 2019-10-18: qty 6.7

## 2019-10-18 MED ORDER — DEXTROSE 50 % IV SOLN
25.0000 mL | Freq: Once | INTRAVENOUS | Status: AC
  Administered 2019-10-18: 25 mL via INTRAVENOUS
  Filled 2019-10-18: qty 50

## 2019-10-18 MED ORDER — BUDESONIDE 0.25 MG/2ML IN SUSP
0.2500 mg | Freq: Two times a day (BID) | RESPIRATORY_TRACT | Status: DC
  Administered 2019-10-18 – 2019-10-21 (×7): 0.25 mg via RESPIRATORY_TRACT
  Filled 2019-10-18 (×7): qty 2

## 2019-10-18 MED ORDER — SERTRALINE HCL 50 MG PO TABS
50.0000 mg | ORAL_TABLET | Freq: Every day | ORAL | Status: DC
  Administered 2019-10-18 – 2019-10-21 (×4): 50 mg via ORAL
  Filled 2019-10-18 (×4): qty 1

## 2019-10-18 MED ORDER — FAMOTIDINE 20 MG PO TABS
20.0000 mg | ORAL_TABLET | Freq: Two times a day (BID) | ORAL | Status: DC
  Administered 2019-10-18 – 2019-10-21 (×7): 20 mg via ORAL
  Filled 2019-10-18 (×6): qty 1

## 2019-10-18 MED ORDER — POLYETHYLENE GLYCOL 3350 17 G PO PACK
17.0000 g | PACK | Freq: Every day | ORAL | Status: DC
  Administered 2019-10-18 – 2019-10-21 (×4): 17 g via ORAL
  Filled 2019-10-18 (×4): qty 1

## 2019-10-18 MED ORDER — SENNA 8.6 MG PO TABS
2.0000 | ORAL_TABLET | Freq: Every day | ORAL | Status: DC
  Administered 2019-10-18 – 2019-10-19 (×2): 17.2 mg via ORAL
  Filled 2019-10-18 (×2): qty 2

## 2019-10-18 MED ORDER — SODIUM CHLORIDE 0.9 % IV SOLN
1.0000 g | INTRAVENOUS | Status: DC
  Administered 2019-10-18 – 2019-10-19 (×2): 1 g via INTRAVENOUS
  Filled 2019-10-18: qty 10
  Filled 2019-10-18: qty 1

## 2019-10-18 MED ORDER — FUROSEMIDE 10 MG/ML IJ SOLN
40.0000 mg | Freq: Once | INTRAMUSCULAR | Status: AC
  Administered 2019-10-18: 40 mg via INTRAVENOUS
  Filled 2019-10-18: qty 4

## 2019-10-18 MED ORDER — CALCIUM GLUCONATE-NACL 1-0.675 GM/50ML-% IV SOLN: 1.0000 g | Freq: Once | INTRAVENOUS | Status: DC

## 2019-10-18 MED ORDER — INSULIN ASPART 100 UNIT/ML ~~LOC~~ SOLN
0.0000 [IU] | Freq: Every day | SUBCUTANEOUS | Status: DC
  Administered 2019-10-18: 4 [IU] via SUBCUTANEOUS
  Administered 2019-10-19: 3 [IU] via SUBCUTANEOUS
  Administered 2019-10-20: 4 [IU] via SUBCUTANEOUS
  Filled 2019-10-18: qty 0.05

## 2019-10-18 MED ORDER — INSULIN GLARGINE 100 UNIT/ML ~~LOC~~ SOLN
5.0000 [IU] | Freq: Every day | SUBCUTANEOUS | Status: DC
  Administered 2019-10-18: 5 [IU] via SUBCUTANEOUS
  Filled 2019-10-18: qty 0.05

## 2019-10-18 MED ORDER — CHLORHEXIDINE GLUCONATE CLOTH 2 % EX PADS
6.0000 | MEDICATED_PAD | Freq: Every day | CUTANEOUS | Status: DC
  Administered 2019-10-18 – 2019-10-20 (×3): 6 via TOPICAL

## 2019-10-18 MED ORDER — ACETAMINOPHEN 325 MG PO TABS: 650.0000 mg | ORAL_TABLET | Freq: Four times a day (QID) | ORAL | Status: DC | PRN

## 2019-10-18 MED ORDER — FLUTICASONE PROPIONATE 50 MCG/ACT NA SUSP
1.0000 | Freq: Every day | NASAL | Status: DC
  Administered 2019-10-18 – 2019-10-21 (×4): 1 via NASAL
  Filled 2019-10-18 (×2): qty 16

## 2019-10-18 MED ORDER — AMLODIPINE BESYLATE 10 MG PO TABS
10.0000 mg | ORAL_TABLET | Freq: Every day | ORAL | Status: DC
  Administered 2019-10-18 – 2019-10-21 (×4): 10 mg via ORAL
  Filled 2019-10-18 (×4): qty 1

## 2019-10-18 MED ORDER — SODIUM CHLORIDE 0.9 % IV SOLN
2.0000 g | Freq: Once | INTRAVENOUS | Status: AC
  Administered 2019-10-18: 2 g via INTRAVENOUS
  Filled 2019-10-18: qty 20

## 2019-10-18 MED ORDER — INSULIN ASPART 100 UNIT/ML ~~LOC~~ SOLN
5.0000 [IU] | Freq: Once | SUBCUTANEOUS | Status: AC
  Administered 2019-10-18: 5 [IU] via SUBCUTANEOUS
  Filled 2019-10-18: qty 0.05

## 2019-10-18 MED ORDER — ORAL CARE MOUTH RINSE
15.0000 mL | Freq: Two times a day (BID) | OROMUCOSAL | Status: DC
  Administered 2019-10-18 – 2019-10-21 (×6): 15 mL via OROMUCOSAL

## 2019-10-18 MED ORDER — MELATONIN 3 MG PO TABS
3.0000 mg | ORAL_TABLET | Freq: Every day | ORAL | Status: DC
  Administered 2019-10-18 – 2019-10-20 (×3): 3 mg via ORAL
  Filled 2019-10-18 (×3): qty 1

## 2019-10-18 MED ORDER — ATORVASTATIN CALCIUM 40 MG PO TABS
80.0000 mg | ORAL_TABLET | Freq: Every day | ORAL | Status: DC
  Administered 2019-10-18 – 2019-10-20 (×3): 80 mg via ORAL
  Filled 2019-10-18 (×3): qty 2

## 2019-10-18 MED ORDER — SODIUM CHLORIDE 0.9 % IV SOLN
500.0000 mg | INTRAVENOUS | Status: DC
  Administered 2019-10-19: 500 mg via INTRAVENOUS
  Filled 2019-10-18: qty 500

## 2019-10-18 MED ORDER — METHYLPREDNISOLONE SODIUM SUCC 125 MG IJ SOLR
60.0000 mg | Freq: Two times a day (BID) | INTRAMUSCULAR | Status: DC
  Administered 2019-10-18 – 2019-10-19 (×2): 60 mg via INTRAVENOUS
  Filled 2019-10-18 (×2): qty 2

## 2019-10-18 MED ORDER — ARFORMOTEROL TARTRATE 15 MCG/2ML IN NEBU
15.0000 ug | INHALATION_SOLUTION | Freq: Two times a day (BID) | RESPIRATORY_TRACT | Status: DC
  Administered 2019-10-18 – 2019-10-21 (×7): 15 ug via RESPIRATORY_TRACT
  Filled 2019-10-18 (×9): qty 2

## 2019-10-18 MED ORDER — INSULIN ASPART 100 UNIT/ML ~~LOC~~ SOLN
0.0000 [IU] | Freq: Three times a day (TID) | SUBCUTANEOUS | Status: DC
  Administered 2019-10-18: 5 [IU] via SUBCUTANEOUS
  Administered 2019-10-18: 11 [IU] via SUBCUTANEOUS
  Administered 2019-10-18: 8 [IU] via SUBCUTANEOUS
  Administered 2019-10-19: 11 [IU] via SUBCUTANEOUS
  Administered 2019-10-19: 8 [IU] via SUBCUTANEOUS
  Administered 2019-10-20: 15 [IU] via SUBCUTANEOUS
  Administered 2019-10-20: 3 [IU] via SUBCUTANEOUS
  Administered 2019-10-21: 5 [IU] via SUBCUTANEOUS
  Administered 2019-10-21: 3 [IU] via SUBCUTANEOUS
  Filled 2019-10-18: qty 0.15

## 2019-10-18 MED ORDER — ASPIRIN EC 81 MG PO TBEC
81.0000 mg | DELAYED_RELEASE_TABLET | Freq: Every day | ORAL | Status: DC
  Administered 2019-10-18 – 2019-10-21 (×4): 81 mg via ORAL
  Filled 2019-10-18 (×3): qty 1

## 2019-10-18 MED ORDER — SODIUM CHLORIDE 0.9% FLUSH: 3.0000 mL | Freq: Once | INTRAVENOUS | Status: DC

## 2019-10-18 MED ORDER — ACETAMINOPHEN 650 MG RE SUPP: 650.0000 mg | Freq: Four times a day (QID) | RECTAL | Status: DC | PRN

## 2019-10-18 MED ORDER — METHYLPREDNISOLONE SODIUM SUCC 125 MG IJ SOLR
60.0000 mg | Freq: Four times a day (QID) | INTRAMUSCULAR | Status: DC
  Administered 2019-10-18: 60 mg via INTRAVENOUS
  Filled 2019-10-18: qty 2

## 2019-10-18 MED ORDER — METOPROLOL SUCCINATE ER 25 MG PO TB24
25.0000 mg | ORAL_TABLET | Freq: Every day | ORAL | Status: DC
  Administered 2019-10-18 – 2019-10-21 (×4): 25 mg via ORAL
  Filled 2019-10-18 (×4): qty 1

## 2019-10-18 MED ORDER — MEMANTINE HCL 10 MG PO TABS
5.0000 mg | ORAL_TABLET | Freq: Every day | ORAL | Status: DC
  Administered 2019-10-18 – 2019-10-20 (×3): 5 mg via ORAL
  Filled 2019-10-18 (×3): qty 1

## 2019-10-18 MED ORDER — ENOXAPARIN SODIUM 40 MG/0.4ML ~~LOC~~ SOLN
40.0000 mg | SUBCUTANEOUS | Status: DC
  Administered 2019-10-18 – 2019-10-21 (×4): 40 mg via SUBCUTANEOUS
  Filled 2019-10-18 (×4): qty 0.4

## 2019-10-18 MED ORDER — ALBUTEROL SULFATE (2.5 MG/3ML) 0.083% IN NEBU: 2.5000 mg | INHALATION_SOLUTION | RESPIRATORY_TRACT | Status: DC | PRN

## 2019-10-18 NOTE — ED Notes (Signed)
Unable to take patient due to lack of time

## 2019-10-18 NOTE — ED Provider Notes (Addendum)
[facility]-EMERGENCY DEPT [facility] Emergency Department Provider Note MRN:  [id number]  Arrival date & time: 10/18/19     Chief Complaint   Shortness of Breath   History of Present Illness   Courtney [name] is a 74 y.o. year-old female with a history of COPD, CAD, diabetes presenting to the ED with chief complaint of shortness of breath.  Gradual onset shortness of breath with coughing over the past 24 hours.  Denies chest pain.  Feels like she is wheezing.  Denies fever, no abdominal pain, no other complaints.  Does not use oxygen at home.  Given Solu-Medrol and inhalers by EMS.  Review of Systems  A complete 10 system review of systems was obtained and all systems are negative except as noted in the HPI and PMH.   Patient's Health History    Past Medical History:  Diagnosis Date  . Anemia   . Arthritis   . Asthma   . CAD (coronary artery disease)   . COPD (chronic obstructive pulmonary disease) (HCC)   . Diabetes mellitus   . Hyperlipidemia   . Hypertension   . Myocardial infarct (HCC)   . Postmenopausal atrophic vaginitis   . Restless leg syndrome     Past Surgical History:  Procedure Laterality Date  . CHOLECYSTECTOMY  1970's  . INCISION AND DRAINAGE ABSCESS Right 06/27/2018   Procedure: 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.;  Surgeon: Romie Levee, MD;  Location: [facility];  Service: General;  Laterality: Right;  . INCISION AND DRAINAGE PERIRECTAL ABSCESS N/A 06/25/2018   Procedure: IRRIGATION AND DEBRIDEMENT PERIRECTAL ABCESS;  Surgeon: Romie Levee, MD;  Location: [facility];  Service: General;  Laterality: N/A;  . IRRIGATION AND DEBRIDEMENT ABSCESS N/A 07/01/2018   Procedure: IRRIGATION AND DEBRIDEMENT ABSCESS;  Surgeon: Emelia Loron, MD;  Location: [facility];  Service: General;  Laterality: N/A;  . OTHER SURGICAL HISTORY  1970's   hysterectomy  . WOUND DEBRIDEMENT N/A 07/03/2018   Procedure: DEBRIDEMENT PELVIS WITH DRESSING CHANGE;  Surgeon:  Emelia Loron, MD;  Location: [facility];  Service: General;  Laterality: N/A;    Family History  Problem Relation Age of Onset  . Hypertension Brother   . Heart attack Neg Hx     Social History   Socioeconomic History  . Marital status: Married    Spouse name: Not on file  . Number of children: Not on file  . Years of education: Not on file  . Highest education level: Not on file  Occupational History  . Not on file  Tobacco Use  . Smoking status: Current Every Day Smoker    Packs/day: 0.50  . Smokeless tobacco: Never Used  Substance and Sexual Activity  . Alcohol use: No  . Drug use: No  . Sexual activity: Not on file  Other Topics Concern  . Not on file  Social History Narrative  . Not on file   Social Determinants of Health   Financial Resource Strain:   . Difficulty of Paying Living Expenses:   Food Insecurity:   . Worried About Programme researcher, broadcasting/film/video in the Last Year:   . Barista in the Last Year:   Transportation Needs:   . Freight forwarder (Medical):   Marland Kitchen Lack of Transportation (Non-Medical):   Physical Activity:   . Days of Exercise per Week:   . Minutes of Exercise per Session:   Stress:   . Feeling of Stress :   Social Connections:   .  Frequency of Communication with Friends and Family:   . Frequency of Social Gatherings with Friends and Family:   . Attends Religious Services:   . Active Member of Clubs or Organizations:   . Attends Archivist Meetings:   Marland Kitchen Marital Status:   Intimate Partner Violence:   . Fear of Current or Ex-Partner:   . Emotionally Abused:   Marland Kitchen Physically Abused:   . Sexually Abused:      Physical Exam   Vitals:   10/18/19 0050  BP: (!) 144/79  Pulse: 72  Resp: 19  Temp: 98.1 F (36.7 C)    CONSTITUTIONAL: Chronically ill-appearing, NAD NEURO:  Alert and oriented x 3, no focal deficits EYES:  eyes equal and reactive ENT/NECK:  no LAD, no JVD CARDIO: Regular rate, well-perfused, normal S1 and  S2 PULM: Diffuse wheezing GI/GU:  normal bowel sounds, non-distended, non-tender MSK/SPINE:  No gross deformities, no edema SKIN:  no rash, atraumatic PSYCH:  Appropriate speech and behavior  *Additional and/or pertinent findings included in MDM below  Diagnostic and Interventional Summary    EKG Interpretation  Date/Time:  Saturday October 18 2019 00:52:01 EDT Ventricular Rate:  74 PR Interval:    QRS Duration: 88 QT Interval:  388 QTC Calculation: 431 R Axis:   39 Text Interpretation: Sinus rhythm Prolonged PR interval Minimal ST elevation, inferior leads Confirmed by Gerlene Fee [phone]) on 10/18/2019 1:28:39 AM      Labs Reviewed  SARS CORONAVIRUS 2 BY RT PCR (HOSPITAL ORDER, [place] [facility])  BASIC METABOLIC PANEL  CBC  TROPONIN I (HIGH SENSITIVITY)    DG Chest 2 View  Final Result      Medications  sodium chloride flush (NS) 0.9 % injection 3 mL (has no administration in time range)  albuterol (VENTOLIN HFA) 108 (90 Base) MCG/ACT inhaler 2 puff (2 puffs Inhalation Given 10/18/19 0124)     Procedures  /  Critical Care .Critical Care Performed by: Maudie Flakes, MD Authorized by: Maudie Flakes, MD   Critical care provider statement:    Critical care time (minutes):  32   Critical care was necessary to treat or prevent imminent or life-threatening deterioration of the following conditions: Hypoxic respiratory failure.   Critical care was time spent personally by me on the following activities:  Discussions with consultants, evaluation of patient's response to treatment, examination of patient, ordering and performing treatments and interventions, ordering and review of laboratory studies, ordering and review of radiographic studies, pulse oximetry, re-evaluation of patient's condition, obtaining history from patient or surrogate and review of old charts    ED Course and Medical Decision Making  I have reviewed the triage vital signs, the  nursing notes, and pertinent available records from the EMR.  Listed above are laboratory and imaging tests that I personally ordered, reviewed, and interpreted and then considered in my medical decision making (see below for details).      Favoring COPD exacerbation given the diffuse wheezing, new oxygen requirement.  Patient currently on 5 L, received Solu-Medrol with EMS, providing further beta agonist here in the emergency department.  Patient will need admission.  Patient has no evidence of DVT, no tachycardia, no chest pain, doubt PE, doubt ACS.  X-ray did demonstrate either edema or infection.  Covered with antibiotics, BNP is elevated, suspect multifactorial hypoxic respiratory failure, will admit to hospital service.    Barth Kirks. Sedonia Small, [place] [email or web address]  Final  Clinical Impressions(s) / ED Diagnoses     ICD-10-CM   1. COPD exacerbation (HCC)  J44.1     ED Discharge Orders    None       Discharge Instructions Discussed with and Provided to Patient:   Discharge Instructions   None       Sabas Sous, MD 10/18/19 0128    Sabas Sous, MD 10/18/19 [phone]

## 2019-10-18 NOTE — H&P (Signed)
History and Physical    Courtney [name] QIO:[id number] DOB: 1946-03-26 DOA: 10/18/2019  PCP: Jordan Hawks, PA-C Patient coming from: [facility]  Chief Complaint: Shortness of breath  HPI: Courtney [name] is a 74 y.o. female with medical history significant of COPD, asthma, CAD, hypertension, hyperlipidemia, insulin-dependent type 2 diabetes, morbid obesity, OSA presenting to the ED via EMS with complaints of shortness of breath.  She was wheezing and hypoxic to 88% on room air with EMS.  She was given Solu-Medrol 125 mg in route.  Patient reports 1 day history of shortness of breath and wheezing.  Denies chest pain or fevers.  She only uses albuterol rescue inhaler as needed for COPD and is not on any maintenance inhalers.  Denies lower extremity edema or orthopnea.  States she was vaccinated for Covid when the vaccines first came out.  No additional history could be obtained from her.  ED Course: Afebrile.  Not tachycardic.  Requiring 4 L supplemental oxygen.  Labs showing no leukocytosis.  Hemoglobin 9.2, above baseline.  Potassium 5.9.  BNP 403. High-sensitivity troponin negative.  SARS-CoV-2 PCR test pending.  Chest x-ray showing patchy airspace opacities throughout both lungs suspicious for edema and/or infectious etiology.  Patient was given albuterol, ceftriaxone, and azithromycin.    Review of Systems:  All systems reviewed and apart from history of presenting illness, are negative.  Past Medical History:  Diagnosis Date  . Anemia   . Arthritis   . Asthma   . CAD (coronary artery disease)   . COPD (chronic obstructive pulmonary disease) (HCC)   . Diabetes mellitus   . Hyperlipidemia   . Hypertension   . Myocardial infarct (HCC)   . Postmenopausal atrophic vaginitis   . Restless leg syndrome     Past Surgical History:  Procedure Laterality Date  . CHOLECYSTECTOMY  1970's  . 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 INCISION AND DRAINAGE PERIRECTAL ABSCESS N/A 06/25/2018   Procedure: IRRIGATION AND DEBRIDEMENT PERIRECTAL ABCESS;  Surgeon: Romie Levee, MD;  Location: [facility];  Service: General;  Laterality: N/A;  . IRRIGATION AND DEBRIDEMENT ABSCESS N/A 07/01/2018   Procedure: IRRIGATION AND DEBRIDEMENT ABSCESS;  Surgeon: Emelia Loron, MD;  Location: [facility];  Service: General;  Laterality: N/A;  . OTHER SURGICAL HISTORY  1970's   hysterectomy  . WOUND DEBRIDEMENT N/A 07/03/2018   Procedure: DEBRIDEMENT PELVIS WITH DRESSING CHANGE;  Surgeon: Emelia Loron, MD;  Location: [facility];  Service: General;  Laterality: N/A;     reports that she has been smoking. She has been smoking about 0.50 packs per day. She has never used smokeless tobacco. She reports that she does not drink alcohol and does not use drugs.  Allergies  Allergen Reactions  . Eggs Or Egg-Derived Products   . Penicillins     Did it involve swelling of the face/tongue/throat, SOB, or low BP? No Did it involve sudden or severe rash/hives, skin peeling, or any reaction on the inside of your mouth or nose? Yes Did you need to seek medical attention at a hospital or doctor's office? yes When did it last happen?more than 10 years If all above answers are "NO", may proceed with cephalosporin use.     Family History  Problem Relation Age of Onset  . Hypertension Brother   . Heart attack Neg Hx  Prior to Admission medications   Medication Sig Start Date End Date Taking? Authorizing Provider  acetaminophen (TYLENOL) 650 MG CR tablet Take 650 mg by mouth every 6 (six) hours as needed for pain.    [provider]  amLODipine (NORVASC) 10 MG tablet Take 1 tablet (10 mg total) by mouth daily. 07/09/18   Dhungel, Theda Belfast, MD  aspirin EC 81 MG EC tablet Take 1 tablet (81 mg total) by mouth  daily. Patient not taking: Reported on 11/01/2018 07/09/18   Dhungel, Theda Belfast, MD  atorvastatin (LIPITOR) 80 MG tablet Take 80 mg by mouth at bedtime.    [provider]  ciprofloxacin (CIPRO) 400 MG/200ML SOLN Inject 400 mg into the vein daily.    [provider]  enoxaparin (LOVENOX) 40 MG/0.4ML injection Inject 40 mg into the skin daily.    [provider]  Ensure Max Protein (ENSURE MAX PROTEIN) LIQD Take 330 mLs (11 oz total) by mouth 2 (two) times daily. Patient not taking: Reported on 11/01/2018 07/08/18   Dhungel, Theda Belfast, MD  famotidine (PEPCID) 20 MG tablet Take 20 mg by mouth 2 (two) times daily.    [provider]  insulin aspart (NOVOLOG) 100 UNIT/ML injection Inject 2-10 Units into the skin 3 (three) times daily before meals. Per sliding scale    [provider]  ipratropium-albuterol (DUONEB) 0.5-2.5 (3) MG/3ML SOLN Take 3 mLs by nebulization every 6 (six) hours as needed. Patient not taking: Reported on 11/01/2018 07/08/18   Dhungel, Theda Belfast, MD  LANTUS SOLOSTAR 100 UNIT/ML Solostar Pen Inject 10 Units into the skin at bedtime.  04/16/18   [provider]  lisinopril (PRINIVIL,ZESTRIL) 10 MG tablet Take 1 tablet (10 mg total) by mouth daily for 30 days. Patient not taking: Reported on 11/01/2018 07/08/18 08/07/18  Dhungel, Theda Belfast, MD  memantine (NAMENDA) 5 MG tablet Take 5 mg by mouth at bedtime.    [provider]  metoprolol succinate (TOPROL-XL) 25 MG 24 hr tablet Take 25 mg by mouth daily.    [provider]  metoprolol succinate (TOPROL-XL) 50 MG 24 hr tablet Take 1 tablet (50 mg total) by mouth daily. Patient not taking: Reported on 11/01/2018 07/08/18   Dhungel, Theda Belfast, MD  nutrition supplement, JUVEN, (JUVEN) PACK Take 1 packet by mouth 2 (two) times daily between meals. Patient not taking: Reported on 11/01/2018 07/09/18   Dhungel, Theda Belfast, MD  oxyCODONE (OXY IR/ROXICODONE) 5 MG immediate release tablet Take 1 tablet (5 mg  total) by mouth every 4 (four) hours as needed for moderate pain. Patient not taking: Reported on 11/01/2018 07/08/18   Dhungel, Theda Belfast, MD  polyethylene glycol (MIRALAX / GLYCOLAX) 17 g packet Take 17 g by mouth daily.    [provider]  potassium chloride (K-DUR) 10 MEQ tablet Take 3 tablets (30 mEq total) by mouth daily for 2 days. 11/01/18 11/03/18  Khatri, Hina, PA-C  potassium chloride SA (K-DUR) 20 MEQ tablet Take 40 mEq by mouth daily.    [provider]  rosuvastatin (CRESTOR) 20 MG tablet Take 1 tablet (20 mg total) by mouth daily. Patient not taking: Reported on 11/01/2018 09/05/16 12/04/16  Corky Crafts, MD  saccharomyces boulardii (FLORASTOR) 250 MG capsule Take 250 mg by mouth 2 (two) times daily.    [provider]  senna (SENOKOT) 8.6 MG TABS tablet Take 2 tablets by mouth daily.    [provider]  sertraline (ZOLOFT) 25 MG tablet Take 25 mg by mouth daily.    [provider]  sodium hypochlorite (DAKIN'S 1/2 STRENGTH) external solution Irrigate with 1 application as directed daily.    [provider]    Physical Exam: Vitals:   10/18/19 0050 10/18/19 0200  BP: (!) 144/79 (!) 161/79  Pulse: 72 72  Resp: 19 15  Temp: 98.1 F (36.7 C)   TempSrc: Oral   SpO2:  99%    Physical Exam Constitutional:      Appearance: She is not diaphoretic.  HENT:     Head: Normocephalic.  Eyes:     Extraocular Movements: Extraocular movements intact.  Neck:     Comments: JVD present Cardiovascular:     Rate and Rhythm: Normal rate and regular rhythm.     Pulses: Normal pulses.  Pulmonary:     Effort: Tachypnea present. No accessory muscle usage.     Breath sounds: Wheezing present.     Comments: Mildly tachypneic with respiratory rate in the low 20s Abdominal:     General: Bowel sounds are normal.     Palpations: Abdomen is soft.     Tenderness: There is no abdominal tenderness. There is no guarding.  Musculoskeletal:         General: No swelling.     Cervical back: Neck supple.  Skin:    General: Skin is warm and dry.  Neurological:     Mental Status: She is alert and oriented to person, place, and time.     Labs on Admission: I have personally reviewed following labs and imaging studies  CBC: Recent Labs  Lab 10/18/19 0051  WBC 7.1  HGB 9.2*  HCT 33.3*  MCV 81.2  PLT 637   Basic Metabolic Panel: Recent Labs  Lab 10/18/19 0051  NA 139  K 5.9*  CL 107  CO2 28  GLUCOSE 142*  BUN 25*  CREATININE 0.76  CALCIUM 9.3   GFR: CrCl cannot be calculated (Unknown ideal weight.). Liver Function Tests: No results for input(s): AST, ALT, ALKPHOS, BILITOT, PROT, ALBUMIN in the last 168 hours. No results for input(s): LIPASE, AMYLASE in the last 168 hours. No results for input(s): AMMONIA in the last 168 hours. Coagulation Profile: No results for input(s): INR, PROTIME in the last 168 hours. Cardiac Enzymes: No results for input(s): CKTOTAL, CKMB, CKMBINDEX, TROPONINI in the last 168 hours. BNP (last 3 results) No results for input(s): PROBNP in the last 8760 hours. HbA1C: No results for input(s): HGBA1C in the last 72 hours. CBG: No results for input(s): GLUCAP in the last 168 hours. Lipid Profile: No results for input(s): CHOL, HDL, LDLCALC, TRIG, CHOLHDL, LDLDIRECT in the last 72 hours. Thyroid Function Tests: No results for input(s): TSH, T4TOTAL, FREET4, T3FREE, THYROIDAB in the last 72 hours. Anemia Panel: No results for input(s): VITAMINB12, FOLATE, FERRITIN, TIBC, IRON, RETICCTPCT in the last 72 hours. Urine analysis:    Component Value Date/Time   COLORURINE AMBER (A) 06/26/2018 1335   APPEARANCEUR HAZY (A) 06/26/2018 1335   LABSPEC 1.040 (H) 06/26/2018 1335   PHURINE 6.0 06/26/2018 1335   GLUCOSEU 50 (A) 06/26/2018 1335   HGBUR NEGATIVE 06/26/2018 1335   BILIRUBINUR NEGATIVE 06/26/2018 1335   KETONESUR 5 (A) 06/26/2018 1335   PROTEINUR 30 (A) 06/26/2018 1335   UROBILINOGEN 1.0  02/11/2010 1320   NITRITE NEGATIVE 06/26/2018 1335   LEUKOCYTESUR NEGATIVE 06/26/2018 1335    Radiological Exams on Admission: DG Chest 2 View  Result Date: 10/18/2019 CLINICAL DATA:  Wheezing and shortness of breath EXAM: CHEST - 2 VIEW COMPARISON:  November 01, 2018 FINDINGS: There is mild cardiomegaly. Mildly increased interstitial markings seen throughout both lungs. There is hazy patchy airspace opacity seen within the right lower lung. There is a probable small left pleural effusion present. No acute osseous abnormality. IMPRESSION: Mildly increased interstitial/patchy airspace opacities throughout both lungs which may be due to edema and/or infectious etiology. Small left pleural effusion. Electronically Signed   By: Jonna Clark M.D.   On: 10/18/2019 01:20    EKG: Independently reviewed.  Sinus rhythm, first-degree AV block.  No significant change since prior tracing.  Assessment/Plan Principal Problem:   Acute on chronic respiratory failure with hypoxia (HCC) Active Problems:   COPD with acute exacerbation (HCC)   New onset of congestive heart failure (HCC)   Multifocal pneumonia   Hyperkalemia   Acute hypoxic respiratory failure secondary to acute exacerbation of COPD/asthma, new onset CHF, and possible multifocal pneumonia: Afebrile and labs showing no leukocytosis.  Hypoxic to 88% on room air with EMS, currently satting well on 2 L supplemental oxygen.  Wheezing on exam. Does have JVD on exam.  BNP elevated at 403. Chest x-ray showing patchy airspace opacities throughout both lungs suspicious for edema and/or infectious etiology.  ?Covid infection although patient states she has already been vaccinated. -Continue Solu-Medrol 60 mg every 6 hours, DuoNebs every 6 hours, albuterol nebulizer as needed, Pulmicort nebs twice daily -Give Lasix 40 mg.  Order echocardiogram.  Monitor intake and output, daily weights, and low-sodium diet with fluid restriction. -Continue ceftriaxone and  azithromycin.  Check procalcitonin level.  SARS-CoV-2 PCR test pending, continue airborne and contact precautions. -Continuous pulse ox, supplemental oxygen.  Order ABG.  Hyperkalemia: Potassium 5.9.  Likely related to home ACE inhibitor and potassium supplement use.  EKG without acute changes. -Cardiac monitoring, insulin and D50, and albuterol.  Repeat BMP.  Hypertension: Systolic in the 140s to 160s. -Lasix as above.  Hydralazine PRN SBP >170.  Insulin-dependent type 2 diabetes -Check A1c.  Sliding scale insulin and CBG checks.  OSA -Continue CPAP at night  Resume home meds after pharmacy med rec has been completed.  DVT prophylaxis: Lovenox Code Status: Patient wishes to be DNR/DNI. Family Communication: No family available at this time. Disposition Plan: Status is: Inpatient  Remains inpatient appropriate because:IV treatments appropriate due to intensity of illness or inability to take PO and Inpatient level of care appropriate due to severity of illness   Dispo: The patient is from: Home              Anticipated d/c is to: Home              Anticipated d/c date is: > 3 days              Patient currently is not medically stable to d/c.  The medical decision making on this patient was of high complexity and the patient is at high risk for clinical deterioration, therefore this is a level 3 visit.  John Giovanni MD [facility]  If 7PM-7AM, please contact night-coverage [email or web address]  10/18/2019, 4:01 AM

## 2019-10-18 NOTE — ED Notes (Signed)
ED TO INPATIENT HANDOFF REPORT  ED Nurse Name and Phone #: Fredonia Highland [phone]  S Name/Age/Gender Courtney [name] 74 y.o. female Room/Bed: RESA/RESA  Code Status   Code Status: DNR  Home/SNF/Other Skilled nursing facility Patient oriented to: self Is this baseline? No   Triage Complete: Triage complete  Chief Complaint Acute on chronic respiratory failure with hypoxia [facility]) [J96.21]  Triage Note From [facility] with the complaints of sob since 1900 yesterday, facility gave patient nebulizer to no avail, was given 125 solumedrol by EMS in route, has wheezing throughout. 88% on RA per EMS at facility    Allergies Allergies  Allergen Reactions   Eggs Or Egg-Derived Products    Penicillins     Did it involve swelling of the face/tongue/throat, SOB, or low BP? No Did it involve sudden or severe rash/hives, skin peeling, or any reaction on the inside of your mouth or nose? Yes Did you need to seek medical attention at a hospital or doctor's office? yes When did it last happen?more than 10 years If all above answers are NO, may proceed with cephalosporin use.     Level of Care/Admitting Diagnosis ED Disposition    ED Disposition Condition Comment   Admit  Hospital Area: [place] [[id number]]  Level of Care: Stepdown [14]  Admit to SDU based on following criteria: Respiratory Distress:  Frequent assessment and/or intervention to maintain adequate ventilation/respiration, pulmonary toilet, and respiratory treatment.  May admit patient to [facility] or [facility] if equivalent level of care is available:: No  Covid Evaluation: Symptomatic Person Under Investigation (PUI)  Admission Type: Emergency [1]  Diagnosis: Acute on chronic respiratory failure with hypoxia [facility]) [[id number]]  Admitting Physician: Shela Leff [[id number]]  Attending Physician: Shela Leff [[id number]]  Estimated length of stay: past midnight tomorrow   Certification:: I certify this patient will need inpatient services for at least 2 midnights       B Medical/Surgery History Past Medical History:  Diagnosis Date   Anemia    Arthritis    Asthma    CAD (coronary artery disease)    COPD (chronic obstructive pulmonary disease) ([place])    Diabetes mellitus    Hyperlipidemia    Hypertension    Myocardial infarct ([place])    Postmenopausal atrophic vaginitis    Restless leg syndrome    Past Surgical History:  Procedure Laterality Date   CHOLECYSTECTOMY  1970's   INCISION AND DRAINAGE ABSCESS Right 06/27/2018   Procedure: 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.;  Surgeon: Leighton Ruff, MD;  Location: [facility];  Service: General;  Laterality: Right;   INCISION AND DRAINAGE PERIRECTAL ABSCESS N/A 06/25/2018   Procedure: IRRIGATION AND DEBRIDEMENT PERIRECTAL ABCESS;  Surgeon: Leighton Ruff, MD;  Location: [facility];  Service: General;  Laterality: N/A;   IRRIGATION AND DEBRIDEMENT ABSCESS N/A 07/01/2018   Procedure: IRRIGATION AND DEBRIDEMENT ABSCESS;  Surgeon: Rolm Bookbinder, MD;  Location: [facility];  Service: General;  Laterality: N/A;   OTHER SURGICAL HISTORY  1970's   hysterectomy   WOUND DEBRIDEMENT N/A 07/03/2018   Procedure: DEBRIDEMENT PELVIS WITH DRESSING CHANGE;  Surgeon: Rolm Bookbinder, MD;  Location: [facility];  Service: General;  Laterality: N/A;     A IV Location/Drains/Wounds Patient Lines/Drains/Airways Status    Active Line/Drains/Airways    Name Placement date Placement time Site Days   Peripheral IV 10/18/19 Left;Posterior Hand 10/18/19  0038  Hand  less than 1   Incision (Closed) 07/03/18 Perineum 07/03/18  1149  472   Pressure Injury 06/25/18 Stage II -  Partial thickness loss of dermis presenting as a shallow open ulcer with a red, pink wound bed without slough. 06/25/18  2200   480   Pressure Injury 06/25/18 Stage II -  Partial thickness loss of dermis presenting as a shallow  open ulcer with a red, pink wound bed without slough. 06/25/18  2200   480   Pressure Injury 06/25/18 Stage II -  Partial thickness loss of dermis presenting as a shallow open ulcer with a red, pink wound bed without slough. 06/25/18  2200   480          Intake/Output Last 24 hours  Intake/Output Summary (Last 24 hours) at 10/18/2019 0444 Last data filed at 10/18/2019 0238 Gross per 24 hour  Intake 100 ml  Output --  Net 100 ml    Labs/Imaging Results for orders placed or performed during the hospital encounter of 10/18/19 (from the past 48 hour(s))  Basic metabolic panel     Status: Abnormal   Collection Time: 10/18/19 12:51 AM  Result Value Ref Range   Sodium 139 135 - 145 mmol/L   Potassium 5.9 (H) 3.5 - 5.1 mmol/L   Chloride 107 98 - 111 mmol/L   CO2 28 22 - 32 mmol/L   Glucose, Bld 142 (H) 70 - 99 mg/dL    Comment: Glucose reference range applies only to samples taken after fasting for at least 8 hours.   BUN 25 (H) 8 - 23 mg/dL   Creatinine, Ser 5.85 0.44 - 1.00 mg/dL   Calcium 9.3 8.9 - 27.7 mg/dL   GFR calc non Af Amer >60 >60 mL/min   GFR calc Af Amer >60 >60 mL/min   Anion gap 4 (L) 5 - 15    Comment: Performed at [facility], [address] [place], [place] [address]  CBC     Status: Abnormal   Collection Time: 10/18/19 12:51 AM  Result Value Ref Range   WBC 7.1 4.0 - 10.5 K/uL   RBC 4.10 3.87 - 5.11 MIL/uL   Hemoglobin 9.2 (L) 12.0 - 15.0 g/dL   HCT 53.6 (L) 36 - 46 %   MCV 81.2 80.0 - 100.0 fL   MCH 22.4 (L) 26.0 - 34.0 pg   MCHC 27.6 (L) 30.0 - 36.0 g/dL   RDW 14.4 31.5 - 40.0 %   Platelets 189 150 - 400 K/uL   nRBC 0.7 (H) 0.0 - 0.2 %    Comment: Performed at [facility], [address] [place], [place] [address]  Troponin I (High Sensitivity)     Status: None   Collection Time: 10/18/19  1:12 AM  Result Value Ref Range   Troponin I (High Sensitivity) 9 <18 ng/L    Comment: (NOTE) Elevated high sensitivity  troponin I (hsTnI) values and significant  changes across serial measurements may suggest ACS but many other  chronic and acute conditions are known to elevate hsTnI results.  Refer to the "Links" section for chest pain algorithms and additional  guidance. Performed at [facility], [address] [place], [place] [address]   Brain natriuretic peptide     Status: Abnormal   Collection Time: 10/18/19  1:26 AM  Result Value Ref Range   B Natriuretic Peptide 403.5 (H) 0.0 - 100.0 pg/mL    Comment: Performed at [facility], [address] [place], [place] [address]  SARS Coronavirus 2 by RT  PCR (hospital order, performed in [facility] hospital lab) Nasopharyngeal Nasopharyngeal Swab     Status: None   Collection Time: 10/18/19  1:29 AM   Specimen: Nasopharyngeal Swab  Result Value Ref Range   SARS Coronavirus 2 NEGATIVE NEGATIVE    Comment: (NOTE) SARS-CoV-2 target nucleic acids are NOT DETECTED.  The SARS-CoV-2 RNA is generally detectable in upper and lower respiratory specimens during the acute phase of infection. The lowest concentration of SARS-CoV-2 viral copies this assay can detect is 250 copies / mL. A negative result does not preclude SARS-CoV-2 infection and should not be used as the sole basis for treatment or other patient management decisions.  A negative result may occur with improper specimen collection / handling, submission of specimen other than nasopharyngeal swab, presence of viral mutation(s) within the areas targeted by this assay, and inadequate number of viral copies (<250 copies / mL). A negative result must be combined with clinical observations, patient history, and epidemiological information.  Fact Sheet for Patients:   BoilerBrush.com.cy  Fact Sheet for Healthcare Providers: https://pope.com/  This test is not yet approved or  cleared by the [place] FDA  and has been authorized for detection and/or diagnosis of SARS-CoV-2 by FDA under an Emergency Use Authorization (EUA).  This EUA will remain in effect (meaning this test can be used) for the duration of the COVID-19 declaration under Section 564(b)(1) of the Act, 21 U.S.C. section 360bbb-3(b)(1), unless the authorization is terminated or revoked sooner.  Performed at [facility], [address] [place], [place] [address]   Blood gas, arterial     Status: Abnormal   Collection Time: 10/18/19  4:03 AM  Result Value Ref Range   FIO2 28.00    pH, Arterial 7.293 (L) 7.35 - 7.45   pCO2 arterial 58.0 (H) 32 - 48 mmHg   pO2, Arterial 60.1 (L) 83 - 108 mmHg   Bicarbonate 27.2 20.0 - 28.0 mmol/L   Acid-Base Excess 0.6 0.0 - 2.0 mmol/L   O2 Saturation 86.8 %   Patient temperature 98.6    Collection site RIGHT RADIAL    Drawn by [id number]    Sample type ARTERIAL    Allens test (pass/fail) PASS PASS    Comment: Performed at [facility], [address], [place], [place] [address]   DG Chest 2 View  Result Date: 10/18/2019 CLINICAL DATA:  Wheezing and shortness of breath EXAM: CHEST - 2 VIEW COMPARISON:  November 01, 2018 FINDINGS: There is mild cardiomegaly. Mildly increased interstitial markings seen throughout both lungs. There is hazy patchy airspace opacity seen within the right lower lung. There is a probable small left pleural effusion present. No acute osseous abnormality. IMPRESSION: Mildly increased interstitial/patchy airspace opacities throughout both lungs which may be due to edema and/or infectious etiology. Small left pleural effusion. Electronically Signed   By: Jonna Clark M.D.   On: 10/18/2019 01:20    Pending Labs Unresulted Labs (From admission, onward) Comment          Start     Ordered   10/18/19 0500  Basic metabolic panel  Tomorrow morning,   R        10/18/19 0345   10/18/19 0339  Procalcitonin - Baseline  ONCE - STAT,   STAT         10/18/19 0345   10/18/19 0337  Hemoglobin A1c  Once,   STAT       Comments: To assess prior glycemic control  10/18/19 0345          Vitals/Pain Today's Vitals   10/18/19 0048 10/18/19 0050 10/18/19 0200 10/18/19 0300  BP:  (!) 144/79 (!) 161/79   Pulse:  72 72   Resp:  19 15   Temp:  98.1 F (36.7 C)    TempSrc:  Oral    SpO2:   99% 100%  PainSc: 0-No pain       Isolation Precautions No active isolations  Medications Medications  sodium chloride flush (NS) 0.9 % injection 3 mL (3 mLs Intravenous Not Given 10/18/19 0138)  insulin aspart (novoLOG) injection 5 Units (has no administration in time range)  dextrose 50 % solution 25 mL (has no administration in time range)  cefTRIAXone (ROCEPHIN) 1 g in sodium chloride 0.9 % 100 mL IVPB (has no administration in time range)  azithromycin (ZITHROMAX) 500 mg in sodium chloride 0.9 % 250 mL IVPB (has no administration in time range)  furosemide (LASIX) injection 40 mg (has no administration in time range)  ipratropium-albuterol (DUONEB) 0.5-2.5 (3) MG/3ML nebulizer solution 3 mL (3 mLs Nebulization Not Given 10/18/19 0437)  albuterol (PROVENTIL) (2.5 MG/3ML) 0.083% nebulizer solution 2.5 mg (has no administration in time range)  budesonide (PULMICORT) nebulizer solution 0.25 mg (has no administration in time range)  methylPREDNISolone sodium succinate (SOLU-MEDROL) 125 mg/2 mL injection 60 mg (has no administration in time range)  insulin aspart (novoLOG) injection 0-15 Units (has no administration in time range)  insulin aspart (novoLOG) injection 0-5 Units (has no administration in time range)  enoxaparin (LOVENOX) injection 40 mg (has no administration in time range)  acetaminophen (TYLENOL) tablet 650 mg (has no administration in time range)    Or  acetaminophen (TYLENOL) suppository 650 mg (has no administration in time range)  albuterol (VENTOLIN HFA) 108 (90 Base) MCG/ACT inhaler 2 puff (2 puffs Inhalation Given 10/18/19  0130)  cefTRIAXone (ROCEPHIN) 2 g in sodium chloride 0.9 % 100 mL IVPB (0 g Intravenous Stopped 10/18/19 0238)  azithromycin (ZITHROMAX) 500 mg in sodium chloride 0.9 % 250 mL IVPB (500 mg Intravenous New Bag/Given 10/18/19 0237)    Mobility non-ambulatory Moderate fall risk   Focused Assessments Pulmonary Assessment Handoff:  Lung sounds: Bilateral Breath Sounds: Expiratory wheezes L Breath Sounds: Expiratory wheezes O2 Device: Nasal Cannula O2 Flow Rate (L/min): 2 L/min      R Recommendations: See Admitting Provider Note  Report given to:   Additional Notes:

## 2019-10-18 NOTE — Progress Notes (Signed)
PROGRESS NOTE    Courtney [name]  XLK:[id number] DOB: 12/16/1945 DOA: 10/18/2019 PCP: Juanda Chance  Brief Narrative:  74 year old black female History of MI 02/11/2010 status post DES Dr. Irish Lack Fournier's gangrene 06/2018 status post multiple surgeries at that time with discharge to [facility] DM TY 2 since 2009 followed by endocrinology Dr. Francetta Found Prior tobacco Undifferentiated COPD/asthma HTN BMI >40 Prior pressure ulcers  Present from [facility] ED 10/18/2019 S OB, O2 sat 88%-?  COPD exacerbation new oxygen requirement Rx Solu-Medrol, oxygen BNP elevated 409 troponin negative  Assessment & Plan:   Principal Problem:   Acute on chronic respiratory failure with hypoxia (HCC) Active Problems:   COPD with acute exacerbation ([place])   New onset of congestive heart failure (HCC)   Multifocal pneumonia   Hyperkalemia   1. Acute multifactorial mMRC dyspnea stage IV i. DDX COPD exacerb vs Healthcare acquired PNA ii. Not swollen-do not think  She has AEchf-rec'd 1 dose lasix last pm iii. Rpt CXR 2 vw in am re: resolution findings iv. IS q 2 while awake b. Likely COPD despite elevated BNP c. Cont azithro/ceftr until CXR 2. DM TY 2, A1c 7.8 a. uncontrolled 2/2 steroids b. Add lantus 5 today with SSI 3. COPD-prior smoker-no spirometry in system a. Changing steroids to q12 60 methyl solumedrol and de-escalate b. Add Brovana c. Add back fluticasone 4. HTN a. Resumed Metoprolol amlodipine 5. BMI 40 6. Prior large surgery 07/19/2018 gangrene a. Review and re-assess on rounds am 7. Hyperkalemia on admission a. resolved spurious b. Hold lisinopril 8. OSA on CPAP a. Resume at nioght if able to tolerate?   DVT prophylaxis: loveneox Code Status: dnr Family Communication: called spouse [phone] Disposition:   Status is: Inpatient  Remains inpatient appropriate because:Hemodynamically unstable, Ongoing active pain requiring inpatient pain management,  Altered mental status, Ongoing diagnostic testing needed not appropriate for outpatient work up, Unsafe d/c plan and IV treatments appropriate due to intensity of illness or inability to take PO  Dispo: The patient is from: SNF              Anticipated d/c is to: SNF              Anticipated d/c date is: > 3 days              Patient currently is not medically stable to d/c.  Consultants:   None yet  Procedures:  Echocardiogram 10/18/2019  Antimicrobials: Ceftriaxone azithromycin  Subjective: Slight confusion this am but resolves quickly  Objective: Vitals:   10/18/19 0300 10/18/19 0447 10/18/19 0549 10/18/19 0550  BP:  (!) 149/81  (!) 151/63  Pulse:  81  75  Resp:  20  16  Temp:  98.5 F (36.9 C) (!) 97.5 F (36.4 C)   TempSrc:  Oral Oral   SpO2: 100% 99%  100%  Weight:    96.7 kg  Height:    5\' 2"  (1.575 m)    Intake/Output Summary (Last 24 hours) at 10/18/2019 0755 Last data filed at 10/18/2019 0238 Gross per 24 hour  Intake 100 ml  Output --  Net 100 ml   Filed Weights   10/18/19 0550  Weight: 96.7 kg    Examination:  General exam: eomi ncat Respiratory system: wheezy-NO rhonchi Cardiovascular system: s1 s2 no m/r/g some pvc's Gastrointestinal system: soft nt nd no rebound . Central nervous system: neuro intact without focal deficit but a little weak Extremities:  No le edema  Skin: didn't examine sacrum Psychiatry: euthymic confused  Data Reviewed: I have personally reviewed following labs and imaging studies ABG 7.293, CO2 58, PaO2 60 BUN/creatinine 25/0.7-->22/8.4 Potassium 5.9-->4.0 Procalcitonin 0.10 WBC 11.5-->7.1 Hemoglobin 8.4-->9.2 Radiology Studies: DG Chest 2 View  Result Date: 10/18/2019 CLINICAL DATA:  Wheezing and shortness of breath EXAM: CHEST - 2 VIEW COMPARISON:  November 01, 2018 FINDINGS: There is mild cardiomegaly. Mildly increased interstitial markings seen throughout both lungs. There is hazy patchy airspace opacity seen within the  right lower lung. There is a probable small left pleural effusion present. No acute osseous abnormality. IMPRESSION: Mildly increased interstitial/patchy airspace opacities throughout both lungs which may be due to edema and/or infectious etiology. Small left pleural effusion. Electronically Signed   By: Jonna Clark M.D.   On: 10/18/2019 01:20     Scheduled Meds: . amLODipine  10 mg Oral Daily  . arformoterol  15 mcg Nebulization BID  . aspirin EC  81 mg Oral Daily  . atorvastatin  80 mg Oral QHS  . budesonide (PULMICORT) nebulizer solution  0.25 mg Nebulization BID  . enoxaparin (LOVENOX) injection  40 mg Subcutaneous Q24H  . famotidine  20 mg Oral BID  . fluticasone  1 spray Each Nare Daily  . insulin aspart  0-15 Units Subcutaneous TID WC  . insulin aspart  0-5 Units Subcutaneous QHS  . insulin glargine  5 Units Subcutaneous QHS  . ipratropium-albuterol  3 mL Nebulization Q6H  . mouth rinse  15 mL Mouth Rinse BID  . memantine  5 mg Oral QHS  . methylPREDNISolone (SOLU-MEDROL) injection  60 mg Intravenous Q12H  . metoprolol succinate  25 mg Oral Daily  . polyethylene glycol  17 g Oral Daily  . senna  2 tablet Oral Daily  . sertraline  50 mg Oral Daily  . sodium chloride flush  3 mL Intravenous Once   Continuous Infusions: . [START ON 10/19/2019] azithromycin    . cefTRIAXone (ROCEPHIN)  IV       LOS: 0 days    Time spent: 23  Rhetta Mura, MD [facility] To contact the attending provider between 7A-7P or the covering provider during after hours 7P-7A, please log into the web site [email or web address] and access using universal [place] password for that web site. If you do not have the password, please call the hospital operator.  10/18/2019, 7:55 AM

## 2019-10-18 NOTE — ED Triage Notes (Signed)
From [facility] with the complaints of sob since 1900 yesterday, facility gave patient nebulizer to no avail, was given 125 solumedrol by EMS in route, has wheezing throughout. 88% on RA per EMS at facility

## 2019-10-18 NOTE — Progress Notes (Signed)
  Echocardiogram 2D Echocardiogram has been performed.  Leta Jungling M 10/18/2019, 8:02 AM

## 2019-10-19 ENCOUNTER — Inpatient Hospital Stay (HOSPITAL_COMMUNITY): Payer: Medicare (Managed Care)

## 2019-10-19 LAB — COMPREHENSIVE METABOLIC PANEL
ALT: 22 U/L (ref 0–44)
AST: 17 U/L (ref 15–41)
Albumin: 3.3 g/dL — ABNORMAL LOW (ref 3.5–5.0)
Alkaline Phosphatase: 149 U/L — ABNORMAL HIGH (ref 38–126)
Anion gap: 10 (ref 5–15)
BUN: 34 mg/dL — ABNORMAL HIGH (ref 8–23)
CO2: 25 mmol/L (ref 22–32)
Calcium: 9.5 mg/dL (ref 8.9–10.3)
Chloride: 102 mmol/L (ref 98–111)
Creatinine, Ser: 0.7 mg/dL (ref 0.44–1.00)
GFR calc Af Amer: >60 mL/min (ref >60)
GFR calc non Af Amer: >60 mL/min (ref >60)
Glucose, Bld: 511 mg/dL (ref 70–99)
Potassium: 4.9 mmol/L (ref 3.5–5.1)
Sodium: 137 mmol/L (ref 135–145)
Total Bilirubin: 0.3 mg/dL (ref 0.3–1.2)
Total Protein: 6.8 g/dL (ref 6.5–8.1)

## 2019-10-19 LAB — CBC WITH DIFFERENTIAL/PLATELET
Abs Immature Granulocytes: 0.49 10*3/uL — ABNORMAL HIGH (ref 0.00–0.07)
Basophils Absolute: 0 10*3/uL (ref 0.0–0.1)
Basophils Relative: 0 %
Eosinophils Absolute: 0 10*3/uL (ref 0.0–0.5)
Eosinophils Relative: 0 %
HCT: 31.7 % — ABNORMAL LOW (ref 36.0–46.0)
Hemoglobin: 9.2 g/dL — ABNORMAL LOW (ref 12.0–15.0)
Immature Granulocytes: 4 %
Lymphocytes Relative: 13 %
Lymphs Abs: 1.4 10*3/uL (ref 0.7–4.0)
MCH: 22.9 pg — ABNORMAL LOW (ref 26.0–34.0)
MCHC: 29 g/dL — ABNORMAL LOW (ref 30.0–36.0)
MCV: 79.1 fL — ABNORMAL LOW (ref 80.0–100.0)
Monocytes Absolute: 0.4 10*3/uL (ref 0.1–1.0)
Monocytes Relative: 4 %
Neutro Abs: 8.7 10*3/uL — ABNORMAL HIGH (ref 1.7–7.7)
Neutrophils Relative %: 79 %
Platelets: 191 10*3/uL (ref 150–400)
RBC: 4.01 MIL/uL (ref 3.87–5.11)
RDW: 15.1 % (ref 11.5–15.5)
WBC: 11 10*3/uL — ABNORMAL HIGH (ref 4.0–10.5)
nRBC: 0.5 % — ABNORMAL HIGH (ref 0.0–0.2)

## 2019-10-19 LAB — GLUCOSE, CAPILLARY
Glucose-Capillary: 346 mg/dL — ABNORMAL HIGH (ref 70–99)
Glucose-Capillary: 335 mg/dL — ABNORMAL HIGH (ref 70–99)
Glucose-Capillary: 479 mg/dL — ABNORMAL HIGH (ref 70–99)
Glucose-Capillary: 424 mg/dL — ABNORMAL HIGH (ref 70–99)
Glucose-Capillary: 396 mg/dL — ABNORMAL HIGH (ref 70–99)
Glucose-Capillary: 290 mg/dL — ABNORMAL HIGH (ref 70–99)
Glucose-Capillary: 277 mg/dL — ABNORMAL HIGH (ref 70–99)

## 2019-10-19 MED ORDER — INSULIN ASPART 100 UNIT/ML ~~LOC~~ SOLN
18.0000 [IU] | Freq: Once | SUBCUTANEOUS | Status: AC
  Administered 2019-10-19: 18 [IU] via SUBCUTANEOUS

## 2019-10-19 MED ORDER — INSULIN GLARGINE 100 UNIT/ML ~~LOC~~ SOLN
12.0000 [IU] | Freq: Every day | SUBCUTANEOUS | Status: DC
  Administered 2019-10-19 – 2019-10-20 (×2): 12 [IU] via SUBCUTANEOUS
  Filled 2019-10-19 (×3): qty 0.12

## 2019-10-19 MED ORDER — IPRATROPIUM-ALBUTEROL 0.5-2.5 (3) MG/3ML IN SOLN
3.0000 mL | Freq: Two times a day (BID) | RESPIRATORY_TRACT | Status: DC
  Administered 2019-10-19 – 2019-10-21 (×4): 3 mL via RESPIRATORY_TRACT
  Filled 2019-10-19 (×4): qty 3

## 2019-10-19 MED ORDER — SENNOSIDES-DOCUSATE SODIUM 8.6-50 MG PO TABS
1.0000 | ORAL_TABLET | Freq: Two times a day (BID) | ORAL | Status: DC
  Administered 2019-10-19 – 2019-10-21 (×5): 1 via ORAL
  Filled 2019-10-19 (×5): qty 1

## 2019-10-19 MED ORDER — PREDNISONE 20 MG PO TABS
60.0000 mg | ORAL_TABLET | Freq: Every day | ORAL | Status: DC
  Administered 2019-10-20 – 2019-10-21 (×2): 60 mg via ORAL
  Filled 2019-10-19 (×2): qty 3

## 2019-10-19 NOTE — Progress Notes (Signed)
Patient refuses CPAP 

## 2019-10-19 NOTE — Progress Notes (Signed)
Report given to receiving RN on 6E. Pt to transfer to room 1621.

## 2019-10-19 NOTE — Progress Notes (Signed)
PROGRESS NOTE    Courtney [name]  AJO:[id number] DOB: 1946-01-31 DOA: 10/18/2019 PCP: Barbarann Ehlers  Brief Narrative:  74 year old black female History of MI 02/11/2010 status post DES Dr. Eldridge Dace Fournier's gangrene 06/2018 status post multiple surgeries at that time with discharge to LTAC--status post diverting colostomy to prevent further issue and contamination of wound DM TY 2 since 2009 followed by endocrinology Dr. Crista Curb Prior tobacco Undifferentiated COPD/asthma HTN BMI >40 Prior pressure ulcers  Present from [facility] 10/18/2019 S OB, O2 sat 88%-?  COPD exacerbation new oxygen requirement Rx Solu-Medrol, oxygen BNP elevated 409 troponin negative  Assessment & Plan:   Principal Problem:   Acute on chronic respiratory failure with hypoxia (HCC) Active Problems:   COPD with acute exacerbation (HCC)   New onset of congestive heart failure (HCC)   Multifocal pneumonia   Hyperkalemia   1. Acute multifactorial mMRC dyspnea stage IV i. DDX COPD exacerb vs Healthcare acquired PNA ii. Not swollen-do not think  She has AEchf-rec'd 1 dose lasix last pm iii. Chest x-ray my overview 6/20 shows mild improvement however seems to have a constellation of ongoing right lung fields posterolaterally iv. Continue IV antibiotics today and likely can transition to oral medications in the morning if v. IS q 2 while awake vi. Her leukocytosis is multifactorial and could be secondary to high-dose steroids Solu-Medrol 60 given every 4 on admission b. Likely COPD despite elevated BNP 2. DM TY 2, A1c 7.8 a. uncontrolled 2/2 steroids-sugars trending to 208--511 this morning b. Increase Lantus to 12 units from 5 units on 6/20 3. COPD-prior smoker-no spirometry in system a. IV Solu-Medrol changed to prednisone 60 b. Add Brovana c. Add back fluticasone 4. HTN a. Resumed Metoprolol amlodipine 5. BMI 40 6. Prior large surgery 07/19/2018 gangrene + Colostomy  LLQ a. No stool in bag b. Continue MiraLAX add Senokot twice daily today 7. Hyperkalemia on admission 8. Mild azotemia secondary to Lasix/lisinopril likely a. Monitor trends of creatinine b. Have to [-] balance fluid with kidney function repeat labs a.m. 9. OSA on CPAP a. Resume at nioght if able to tolerate?   DVT prophylaxis: loveneox Code Status: dnr Family Communication: called spouse [phone] 6/20 Disposition:   Status is: Inpatient  Remains inpatient appropriate because:Hemodynamically unstable, Ongoing active pain requiring inpatient pain management, Altered mental status, Ongoing diagnostic testing needed not appropriate for outpatient work up, Unsafe d/c plan and IV treatments appropriate due to intensity of illness or inability to take PO  Dispo: The patient is from: SNF              Anticipated d/c is to: SNF              Anticipated d/c date is: > 3 days              Patient currently is not medically stable to d/c.  Consultants:   None yet  Procedures:  Echocardiogram 10/18/2019 EF 60-65% moderately elevated pulmonary artery systolic pressure 48.4 Grade 2 diastolic dysfunction  CXR 6/20 as above and negative  Antimicrobials: Ceftriaxone azithromycin  Subjective: Much more coherent alert understands plan No chest pain no fever Tells me her breathing is better Is constipated   Objective: Vitals:   10/19/19 0022 10/19/19 0206 10/19/19 0554 10/19/19 0835  BP:  (!) 124/57 (!) 122/56   Pulse: 79 70 73 77  Resp: (!) 22 18 18 18   Temp:  97.6 F (36.4 C) (!) 97.5 F (36.4  C)   TempSrc:  Oral Oral   SpO2: 95% 92% 93% 91%  Weight:  96.7 kg    Height:        Intake/Output Summary (Last 24 hours) at 10/19/2019 1030 Last data filed at 10/19/2019 0900 Gross per 24 hour  Intake 1060 ml  Output 500 ml  Net 560 ml   Filed Weights   10/18/19 0550 10/19/19 0206  Weight: 96.7 kg 96.7 kg    Examination:  General exam: eomi ncat Respiratory system: Wheezy  but no rales and seems improved compared to prior Cardiovascular system: s1 s2 no m/r/g some pvc's Gastrointestinal system: soft nt nd no rebound . Central nervous system: neuro intact without focal deficit but a little weak Extremities:  No le edema Skin: didn't examine sacrum Psychiatry: euthymic confused  Data Reviewed: I have personally reviewed following labs and imaging studies BUN/creatinine 25/0.7-->22/8.4-->34/0.7 Potassium 5.9-->4.0-->4.9 -->5 WBC 11 Hemoglobin 8.4-->9.2 Radiology Studies: DG Chest 2 View  Result Date: 10/19/2019 CLINICAL DATA:  Cough and shortness of breath EXAM: CHEST - 2 VIEW COMPARISON:  None. FINDINGS: Enlarged cardiac silhouette. Calcified aorta. There is fluid along the RIGHT horizontal fissure. No change from prior. Central venous congestion IMPRESSION: Cardiomegaly, central venous congestion and pleural fluid unchanged. Electronically Signed   By: Genevive Bi M.D.   On: 10/19/2019 10:15   DG Chest 2 View  Result Date: 10/18/2019 CLINICAL DATA:  Wheezing and shortness of breath EXAM: CHEST - 2 VIEW COMPARISON:  November 01, 2018 FINDINGS: There is mild cardiomegaly. Mildly increased interstitial markings seen throughout both lungs. There is hazy patchy airspace opacity seen within the right lower lung. There is a probable small left pleural effusion present. No acute osseous abnormality. IMPRESSION: Mildly increased interstitial/patchy airspace opacities throughout both lungs which may be due to edema and/or infectious etiology. Small left pleural effusion. Electronically Signed   By: Jonna Clark M.D.   On: 10/18/2019 01:20   ECHOCARDIOGRAM COMPLETE  Result Date: 10/18/2019    ECHOCARDIOGRAM REPORT   Patient Name:   Courtney [name] Date of Exam: 10/18/2019 Medical Rec #:  [id number]       Height:       62.0 in Accession #:    [id number]      Weight:       213.2 lb Date of Birth:  [birth year]        BSA:          1.964 m Patient Age:    74 years        BP:            151/63 mmHg Patient Gender: F               HR:           74 bpm. Exam Location:  Inpatient Procedure: 2D Echo Indications:    CHF-Acute Systolic 428.21 / I50.21  History:        Patient has prior history of Echocardiogram examinations, most                 recent 01/17/2013. CAD and Previous Myocardial Infarction; Risk                 Factors:Hypertension, Dyslipidemia, Diabetes and Current Smoker.  Sonographer:    Leta Jungling RDCS Referring Phys: [id number] [employer] RATHORE  Sonographer Comments: Image acquisition challenging due to patient body habitus and Image acquisition challenging due to COPD. IMPRESSIONS  1. Left ventricular ejection fraction, by estimation, is 60  to 65%. The left ventricle has normal function. The left ventricle has no regional wall motion abnormalities. There is moderate asymmetric left ventricular hypertrophy of the basal-septal segment. Left ventricular diastolic parameters are consistent with Grade II diastolic dysfunction (pseudonormalization). Elevated left atrial pressure.  2. Right ventricular systolic function is normal. The right ventricular size is normal. There is moderately elevated pulmonary artery systolic pressure. The estimated right ventricular systolic pressure is 44.0 mmHg.  3. The mitral valve is normal in structure. Trivial mitral valve regurgitation.  4. The aortic valve is tricuspid. Aortic valve regurgitation is not visualized. Mild aortic valve sclerosis is present, with no evidence of aortic valve stenosis.  5. The inferior vena cava is normal in size with <50% respiratory variability, suggesting right atrial pressure of 8 mmHg. FINDINGS  Left Ventricle: Left ventricular ejection fraction, by estimation, is 60 to 65%. The left ventricle has normal function. The left ventricle has no regional wall motion abnormalities. The left ventricular internal cavity size was normal in size. There is  moderate asymmetric left ventricular hypertrophy of the basal-septal  segment. Left ventricular diastolic parameters are consistent with Grade II diastolic dysfunction (pseudonormalization). Elevated left atrial pressure. Right Ventricle: The right ventricular size is normal. No increase in right ventricular wall thickness. Right ventricular systolic function is normal. There is moderately elevated pulmonary artery systolic pressure. The tricuspid regurgitant velocity is 3.18 m/s, and with an assumed right atrial pressure of 8 mmHg, the estimated right ventricular systolic pressure is 10.2 mmHg. Left Atrium: Left atrial size was normal in size. Right Atrium: Right atrial size was not well visualized. Pericardium: Trivial pericardial effusion is present. Presence of pericardial fat pad. Mitral Valve: The mitral valve is normal in structure. Trivial mitral valve regurgitation. Tricuspid Valve: The tricuspid valve is normal in structure. Tricuspid valve regurgitation is trivial. Aortic Valve: The aortic valve is tricuspid. Aortic valve regurgitation is not visualized. Mild aortic valve sclerosis is present, with no evidence of aortic valve stenosis. Pulmonic Valve: The pulmonic valve was not well visualized. Pulmonic valve regurgitation is not visualized. Aorta: The aortic root and ascending aorta are structurally normal, with no evidence of dilitation. Venous: The inferior vena cava is normal in size with less than 50% respiratory variability, suggesting right atrial pressure of 8 mmHg. IAS/Shunts: No atrial level shunt detected by color flow Doppler.  LEFT VENTRICLE PLAX 2D LVIDd:         4.60 cm  Diastology LVIDs:         2.90 cm  LV e' lateral:   5.55 cm/s LV PW:         0.80 cm  LV E/e' lateral: 18.6 LV IVS:        1.10 cm  LV e' medial:    5.98 cm/s LVOT diam:     1.60 cm  LV E/e' medial:  17.2 LV SV:         53 LV SV Index:   27 LVOT Area:     2.01 cm  RIGHT VENTRICLE RV S prime:     10.30 cm/s RVOT diam:      1.70 cm TAPSE (M-mode): 2.0 cm LEFT ATRIUM           Index       RIGHT  ATRIUM           Index LA diam:      3.60 cm 1.83 cm/m  RA Area:     13.10 cm LA Vol (A2C): 47.4 ml 24.13 ml/m  RA Volume:   31.10 ml  15.83 ml/m LA Vol (A4C): 56.2 ml 28.61 ml/m  AORTIC VALVE LVOT Vmax:   111.00 cm/s LVOT Vmean:  77.400 cm/s LVOT VTI:    0.262 m  AORTA Ao Root diam: 2.90 cm MITRAL VALVE                TRICUSPID VALVE MV Area (PHT): 6.37 cm     TR Peak grad:   40.4 mmHg MV Decel Time: 119 msec     TR Vmax:        318.00 cm/s MV E velocity: 103.00 cm/s MV A velocity: 116.00 cm/s  SHUNTS MV E/A ratio:  0.89         Systemic VTI:  0.26 m                             Systemic Diam: 1.60 cm                             Pulmonic Diam: 1.70 cm Epifanio Lesches MD Electronically signed by Epifanio Lesches MD Signature Date/Time: 10/18/2019/1:22:21 PM    Final      Scheduled Meds: . amLODipine  10 mg Oral Daily  . arformoterol  15 mcg Nebulization BID  . aspirin EC  81 mg Oral Daily  . atorvastatin  80 mg Oral QHS  . budesonide (PULMICORT) nebulizer solution  0.25 mg Nebulization BID  . Chlorhexidine Gluconate Cloth  6 each Topical Daily  . enoxaparin (LOVENOX) injection  40 mg Subcutaneous Q24H  . famotidine  20 mg Oral BID  . fluticasone  1 spray Each Nare Daily  . insulin aspart  0-15 Units Subcutaneous TID WC  . insulin aspart  0-5 Units Subcutaneous QHS  . insulin glargine  12 Units Subcutaneous QHS  . ipratropium-albuterol  3 mL Nebulization Q6H  . mouth rinse  15 mL Mouth Rinse BID  . melatonin  3 mg Oral QHS  . memantine  5 mg Oral QHS  . metoprolol succinate  25 mg Oral Daily  . polyethylene glycol  17 g Oral Daily  . [START ON 10/20/2019] predniSONE  60 mg Oral QAC breakfast  . senna  2 tablet Oral Daily  . senna-docusate  1 tablet Oral BID  . sertraline  50 mg Oral Daily  . sodium chloride flush  3 mL Intravenous Once   Continuous Infusions: . azithromycin    . cefTRIAXone (ROCEPHIN)  IV Stopped (10/18/19 2200)     LOS: 1 day    Time spent:  25  Rhetta Mura, MD [facility] To contact the attending provider between 7A-7P or the covering provider during after hours 7P-7A, please log into the web site [email or web address] and access using universal  password for that web site. If you do not have the password, please call the hospital operator.  10/19/2019, 10:30 AM

## 2019-10-20 LAB — CBC WITH DIFFERENTIAL/PLATELET
Abs Immature Granulocytes: 0.13 10*3/uL — ABNORMAL HIGH (ref 0.00–0.07)
Basophils Absolute: 0 10*3/uL (ref 0.0–0.1)
Basophils Relative: 0 %
Eosinophils Absolute: 0 10*3/uL (ref 0.0–0.5)
Eosinophils Relative: 0 %
HCT: 32.8 % — ABNORMAL LOW (ref 36.0–46.0)
Hemoglobin: 9.2 g/dL — ABNORMAL LOW (ref 12.0–15.0)
Immature Granulocytes: 1 %
Lymphocytes Relative: 17 %
Lymphs Abs: 2.7 10*3/uL (ref 0.7–4.0)
MCH: 22.1 pg — ABNORMAL LOW (ref 26.0–34.0)
MCHC: 28 g/dL — ABNORMAL LOW (ref 30.0–36.0)
MCV: 78.7 fL — ABNORMAL LOW (ref 80.0–100.0)
Monocytes Absolute: 1 10*3/uL (ref 0.1–1.0)
Monocytes Relative: 6 %
Neutro Abs: 11.6 10*3/uL — ABNORMAL HIGH (ref 1.7–7.7)
Neutrophils Relative %: 76 %
Platelets: 225 10*3/uL (ref 150–400)
RBC: 4.17 MIL/uL (ref 3.87–5.11)
RDW: 15.1 % (ref 11.5–15.5)
WBC: 15.4 10*3/uL — ABNORMAL HIGH (ref 4.0–10.5)
nRBC: 0.5 % — ABNORMAL HIGH (ref 0.0–0.2)

## 2019-10-20 LAB — COMPREHENSIVE METABOLIC PANEL
ALT: 21 U/L (ref 0–44)
AST: 15 U/L (ref 15–41)
Albumin: 3.5 g/dL (ref 3.5–5.0)
Alkaline Phosphatase: 147 U/L — ABNORMAL HIGH (ref 38–126)
Anion gap: 11 (ref 5–15)
BUN: 30 mg/dL — ABNORMAL HIGH (ref 8–23)
CO2: 24 mmol/L (ref 22–32)
Calcium: 9.7 mg/dL (ref 8.9–10.3)
Chloride: 100 mmol/L (ref 98–111)
Creatinine, Ser: 0.6 mg/dL (ref 0.44–1.00)
GFR calc Af Amer: >60 mL/min (ref >60)
GFR calc non Af Amer: >60 mL/min (ref >60)
Glucose, Bld: 237 mg/dL — ABNORMAL HIGH (ref 70–99)
Potassium: 4.5 mmol/L (ref 3.5–5.1)
Sodium: 135 mmol/L (ref 135–145)
Total Bilirubin: 0.3 mg/dL (ref 0.3–1.2)
Total Protein: 7.3 g/dL (ref 6.5–8.1)

## 2019-10-20 LAB — GLUCOSE, CAPILLARY
Glucose-Capillary: 187 mg/dL — ABNORMAL HIGH (ref 70–99)
Glucose-Capillary: 344 mg/dL — ABNORMAL HIGH (ref 70–99)
Glucose-Capillary: 369 mg/dL — ABNORMAL HIGH (ref 70–99)
Glucose-Capillary: 347 mg/dL — ABNORMAL HIGH (ref 70–99)

## 2019-10-20 MED ORDER — DOXYCYCLINE HYCLATE 100 MG PO TABS
100.0000 mg | ORAL_TABLET | Freq: Two times a day (BID) | ORAL | Status: DC
  Administered 2019-10-20 – 2019-10-21 (×3): 100 mg via ORAL
  Filled 2019-10-20 (×3): qty 1

## 2019-10-20 MED ORDER — FERROUS SULFATE 325 (65 FE) MG PO TABS
325.0000 mg | ORAL_TABLET | Freq: Two times a day (BID) | ORAL | Status: DC
  Administered 2019-10-20 – 2019-10-21 (×2): 325 mg via ORAL
  Filled 2019-10-20 (×2): qty 1

## 2019-10-20 NOTE — Progress Notes (Signed)
PROGRESS NOTE    Courtney [name]  IPJ:[id number] DOB: 06/27/1945 DOA: 10/18/2019 PCP: Barbarann Ehlers  Brief Narrative:  74 year old black female History of MI 02/11/2010 status post DES Dr. Eldridge Dace Fournier's gangrene 06/2018 status post multiple surgeries at that time with discharge to LTAC--status post diverting colostomy to prevent further issue and contamination of wound DM TY 2 since 2009 followed by endocrinology Dr. Crista Curb Prior tobacco Undifferentiated COPD/asthma HTN BMI >40 Prior pressure ulcers  Present from [facility] 10/18/2019 S OB, O2 sat 88%-?  COPD exacerbation new oxygen requirement Rx Solu-Medrol, oxygen BNP elevated 409 troponin negative  Assessment & Plan:   Principal Problem:   Acute on chronic respiratory failure with hypoxia (HCC) Active Problems:   COPD with acute exacerbation (HCC)   New onset of congestive heart failure (HCC)   Multifocal pneumonia   Hyperkalemia   1. Acute multifactorial mMRC dyspnea stage IV i. DDX COPD exacerb vs Healthcare acquired PNA ii. Not swollen-do not think  She has AEchf-rec'd 1 dose lasix last pm iii. Chest x-ray my overview 6/20 shows mild improvement however slight consolidation ongoing right lung fields posterolaterally iv. Change to PO doxy from CAP form IV abx today v. IS q 2 while awake vi. ^ WBC multifactorial ? 2/2 Solu-Medrol 60 given every 4 on admission b. Likely COPD despite elevated BNP 2. DM TY 2, A1c 7.8 a. uncontrolled 2/2 steroids-sugars however improved 187--344 b. Increase Lantus to 12 units from 5 units on 6/20 3. Anemia of iron deficiency microcytic a. We will check iron levels in the morning after discussion with husband-it appears that the patient has had iron infusions before b. Start ferrous sulfate now 4. COPD-prior smoker-no spirometry in system a. IV Solu-Medrol changed to prednisone 60 b. Add Brovana c. Add back fluticasone 5. HTN a. Resumed Metoprolol  amlodipine 6. BMI 40 7. Prior large surgery 07/19/2018 gangrene + Colostomy LLQ a. No stool in bag b. Continue MiraLAX add Senokot twice daily today 8. Hyperkalemia on admission 9. Mild azotemia secondary to Lasix/lisinopril likely a. Stable creatinine b. Stable fluid balance1 10. OSA on CPAP a. Resume at night if able to tolerate?   DVT prophylaxis: loveneox Code Status: dnr Family Communication: Discussed with husband at bedside on 10/20/2019 [phone]  Disposition:   Status is: Inpatient  Remains inpatient appropriate because:Hemodynamically unstable, Ongoing active pain requiring inpatient pain management, Altered mental status, Ongoing diagnostic testing needed not appropriate for outpatient work up, Unsafe d/c plan and IV treatments appropriate due to intensity of illness or inability to take PO  Dispo: The patient is from: SNF              Anticipated d/c is to: SNF              Anticipated d/c date is: 1 day              Patient currently is not medically stable to d/c.  Consultants:   None yet  Procedures:  Echocardiogram 10/18/2019 EF 60-65% moderately elevated pulmonary artery systolic pressure 48.4 Grade 2 diastolic dysfunction  CXR 6/20 as above and negative  Antimicrobials: Ceftriaxone azithromycin  Subjective:  pleasan tcoherent eating drinking therapy has seen no cp fever chills  Still slght wheeze   Objective: Vitals:   10/20/19 0634 10/20/19 0739 10/20/19 1001 10/20/19 1321  BP: 134/64  (!) 129/55 (!) 147/78  Pulse: 63 62 75 75  Resp: 16 16  18   Temp: 97.6 F (36.4  C)   97.8 F (36.6 C)  TempSrc: Oral   Oral  SpO2: 95% 96%  92%  Weight: 100.7 kg     Height:        Intake/Output Summary (Last 24 hours) at 10/20/2019 1405 Last data filed at 10/20/2019 1200 Gross per 24 hour  Intake 1303.38 ml  Output 1650 ml  Net -346.62 ml   Filed Weights   10/18/19 0550 10/19/19 0206 10/20/19 0634  Weight: 96.7 kg 96.7 kg 100.7 kg     Examination:  General exam: eomi ncat Respiratory system: Wheezy but overall is improved Cardiovascular system: s1 s2 no m/r/g Gastrointestinal system: soft nt nd no rebound . Central nervous system: neuro intact without focal deficit but a little weak Extremities:  No le edema Skin: didn't examine sacrum Psychiatry: euthymic confused  Data Reviewed: I have personally reviewed following labs and imaging studies BUN/creatinine 25/0.7-->22/8.4--> 30/0.6 Potassium 5.9-->4.0-->4.5 WBC 15 Hemoglobin 8.4-->9.2-->9.2 Radiology Studies: DG Chest 2 View  Result Date: 10/19/2019 CLINICAL DATA:  Cough and shortness of breath EXAM: CHEST - 2 VIEW COMPARISON:  None. FINDINGS: Enlarged cardiac silhouette. Calcified aorta. There is fluid along the RIGHT horizontal fissure. No change from prior. Central venous congestion IMPRESSION: Cardiomegaly, central venous congestion and pleural fluid unchanged. Electronically Signed   By: Suzy Bouchard M.D.   On: 10/19/2019 10:15     Scheduled Meds: . amLODipine  10 mg Oral Daily  . arformoterol  15 mcg Nebulization BID  . aspirin EC  81 mg Oral Daily  . atorvastatin  80 mg Oral QHS  . budesonide (PULMICORT) nebulizer solution  0.25 mg Nebulization BID  . Chlorhexidine Gluconate Cloth  6 each Topical Daily  . doxycycline  100 mg Oral Q12H  . enoxaparin (LOVENOX) injection  40 mg Subcutaneous Q24H  . famotidine  20 mg Oral BID  . ferrous sulfate  325 mg Oral BID WC  . fluticasone  1 spray Each Nare Daily  . insulin aspart  0-15 Units Subcutaneous TID WC  . insulin aspart  0-5 Units Subcutaneous QHS  . insulin glargine  12 Units Subcutaneous QHS  . ipratropium-albuterol  3 mL Nebulization BID  . mouth rinse  15 mL Mouth Rinse BID  . melatonin  3 mg Oral QHS  . memantine  5 mg Oral QHS  . metoprolol succinate  25 mg Oral Daily  . polyethylene glycol  17 g Oral Daily  . predniSONE  60 mg Oral QAC breakfast  . senna-docusate  1 tablet Oral BID   . sertraline  50 mg Oral Daily  . sodium chloride flush  3 mL Intravenous Once   Continuous Infusions: . azithromycin Stopped (10/19/19 2326)  . cefTRIAXone (ROCEPHIN)  IV Stopped (10/19/19 2218)     LOS: 2 days    Time spent: 9  Nita Sells, MD [facility] To contact the attending provider between 7A-7P or the covering provider during after hours 7P-7A, please log into the web site [email or web address] and access using universal [place] password for that web site. If you do not have the password, please call the hospital operator.  10/20/2019, 2:05 PM

## 2019-10-20 NOTE — Progress Notes (Signed)
OT Cancellation Note  Patient Details Name: Courtney [name] MRN: [id number] DOB: Aug 21, 1945   Cancelled Treatment:    Reason Eval/Treat Not Completed: OT screened, no needs identified, will sign off (Pt with baseline dependence in ADL and mobility.)  Evern Bio 10/20/2019, 1:10 PM  Martie Round, OTR/L Acute Rehabilitation Services Pager: [phone] Office: [phone]

## 2019-10-20 NOTE — Progress Notes (Signed)
Pt. refusing CPAP, does not wear one at home.

## 2019-10-20 NOTE — Evaluation (Signed)
Physical Therapy Evaluation-1x Patient Details Name: Courtney [name] MRN: [id number] DOB: Dec 14, 1945 Today's Date: 10/20/2019   History of Present Illness  74 yo female admitted with acute on chronic respiratory failure 2* CHF exac, COPD exac, possible Pna. Hx of COPD, CAD, asthma, Dm, obesity, OSA, MI, RLS, anemia, falls. Pt is a LTC SNF resident  Clinical Impression  On eval, pt was Mod assist for bed mobility. She was able to sit EOB for at least 5 minutes with Min guard assist. Audible wheezing noted. Per pt and husband, pt is a LTC SNF resident at [facility]. Pt reports SNF staff use either a SB or hoyer lift for OOB to [place]. Plan is for pt to return to SNF. 1x eval. Recommend OOB to chair using lift with nursing staff. Will sign off.     Follow Up Recommendations  (return to SNF)    Equipment Recommendations  None recommended by PT    Recommendations for Other Services       Precautions / Restrictions Precautions Precautions: Fall Restrictions Weight Bearing Restrictions: No      Mobility  Bed Mobility Overal bed mobility: Needs Assistance Bed Mobility: Supine to Sit;Sit to Supine     Supine to sit: HOB elevated;Mod assist Sit to supine: HOB elevated;Mod assist   General bed mobility comments: Assist for trunk and bil LEs. Utilized bedpad for scooting, positioning. Increased time. Pt relied on bedrail. Cues for technique, safety.  Transfers                 General transfer comment: NT for safety reasons  Ambulation/Gait                Stairs            Wheelchair Mobility    Modified Rankin (Stroke Patients Only)       Balance Overall balance assessment: Needs assistance                                           Pertinent Vitals/Pain Pain Assessment: No/denies pain    Home Living Family/patient expects to be discharged to:: Skilled nursing facility                      Prior Function Level of  Independence: Needs assistance   Gait / Transfers Assistance Needed: uses SB vs lift with staff at SNF  ADL's / Homemaking Assistance Needed: assist for ADLs        Hand Dominance        Extremity/Trunk Assessment   Upper Extremity Assessment Upper Extremity Assessment: Generalized weakness    Lower Extremity Assessment Lower Extremity Assessment: Generalized weakness    Cervical / Trunk Assessment Cervical / Trunk Assessment: Kyphotic  Communication   Communication: No difficulties  Cognition Arousal/Alertness: Awake/alert Behavior During Therapy: WFL for tasks assessed/performed Overall Cognitive Status: Within Functional Limits for tasks assessed                                        General Comments      Exercises     Assessment/Plan    PT Assessment All further PT needs can be met in the next venue of care (SNF)  PT Problem List Decreased strength;Decreased mobility;Decreased activity tolerance;Decreased balance;Obesity;Decreased knowledge of  use of DME       PT Treatment Interventions      PT Goals (Current goals can be found in the Care Plan section)  Acute Rehab PT Goals Patient Stated Goal: to get better. back to SNF. PT Goal Formulation: All assessment and education complete, DC therapy    Frequency     Barriers to discharge        Co-evaluation               AM-PAC PT "6 Clicks" Mobility  Outcome Measure Help needed turning from your back to your side while in a flat bed without using bedrails?: A Lot Help needed moving from lying on your back to sitting on the side of a flat bed without using bedrails?: A Lot Help needed moving to and from a bed to a chair (including a wheelchair)?: Total Help needed standing up from a chair using your arms (e.g., wheelchair or bedside chair)?: Total Help needed to walk in hospital room?: Total Help needed climbing 3-5 steps with a railing? : Total 6 Click Score: 8    End of  Session   Activity Tolerance: Patient tolerated treatment well Patient left: in bed;with bed alarm set;with call bell/phone within reach;with family/visitor present   PT Visit Diagnosis: Muscle weakness (generalized) (M62.81)    Time: 8502-7741 PT Time Calculation (min) (ACUTE ONLY): 17 min   Charges:   PT Evaluation $PT Eval Low Complexity: [place], PT Acute Rehabilitation  Office: [phone] Pager: [phone]

## 2019-10-21 LAB — CBC WITH DIFFERENTIAL/PLATELET
Abs Immature Granulocytes: 0.19 10*3/uL — ABNORMAL HIGH (ref 0.00–0.07)
Basophils Absolute: 0 10*3/uL (ref 0.0–0.1)
Basophils Relative: 0 %
Eosinophils Absolute: 0 10*3/uL (ref 0.0–0.5)
Eosinophils Relative: 0 %
HCT: 30.7 % — ABNORMAL LOW (ref 36.0–46.0)
Hemoglobin: 8.9 g/dL — ABNORMAL LOW (ref 12.0–15.0)
Immature Granulocytes: 1 %
Lymphocytes Relative: 23 %
Lymphs Abs: 3.2 10*3/uL (ref 0.7–4.0)
MCH: 22.2 pg — ABNORMAL LOW (ref 26.0–34.0)
MCHC: 29 g/dL — ABNORMAL LOW (ref 30.0–36.0)
MCV: 76.6 fL — ABNORMAL LOW (ref 80.0–100.0)
Monocytes Absolute: 1.1 10*3/uL — ABNORMAL HIGH (ref 0.1–1.0)
Monocytes Relative: 8 %
Neutro Abs: 9.6 10*3/uL — ABNORMAL HIGH (ref 1.7–7.7)
Neutrophils Relative %: 68 %
Platelets: 241 10*3/uL (ref 150–400)
RBC: 4.01 MIL/uL (ref 3.87–5.11)
RDW: 15.2 % (ref 11.5–15.5)
WBC: 14.1 10*3/uL — ABNORMAL HIGH (ref 4.0–10.5)
nRBC: 0.7 % — ABNORMAL HIGH (ref 0.0–0.2)

## 2019-10-21 LAB — COMPREHENSIVE METABOLIC PANEL
ALT: 21 U/L (ref 0–44)
AST: 15 U/L (ref 15–41)
Albumin: 3.2 g/dL — ABNORMAL LOW (ref 3.5–5.0)
Alkaline Phosphatase: 135 U/L — ABNORMAL HIGH (ref 38–126)
Anion gap: 10 (ref 5–15)
BUN: 22 mg/dL (ref 8–23)
CO2: 27 mmol/L (ref 22–32)
Calcium: 9.6 mg/dL (ref 8.9–10.3)
Chloride: 100 mmol/L (ref 98–111)
Creatinine, Ser: 0.47 mg/dL (ref 0.44–1.00)
GFR calc Af Amer: >60 mL/min (ref >60)
GFR calc non Af Amer: >60 mL/min (ref >60)
Glucose, Bld: 187 mg/dL — ABNORMAL HIGH (ref 70–99)
Potassium: 3.8 mmol/L (ref 3.5–5.1)
Sodium: 137 mmol/L (ref 135–145)
Total Bilirubin: 0.4 mg/dL (ref 0.3–1.2)
Total Protein: 6.4 g/dL — ABNORMAL LOW (ref 6.5–8.1)

## 2019-10-21 LAB — FERRITIN: Ferritin: 11 ng/mL (ref 11–307)

## 2019-10-21 LAB — RETICULOCYTES
Immature Retic Fract: 22.9 % — ABNORMAL HIGH (ref 2.3–15.9)
RBC.: 3.98 MIL/uL (ref 3.87–5.11)
Retic Count, Absolute: 66.9 10*3/uL (ref 19.0–186.0)
Retic Ct Pct: 1.7 % (ref 0.4–3.1)

## 2019-10-21 LAB — SARS CORONAVIRUS 2 (TAT 6-24 HRS): SARS Coronavirus 2: NEGATIVE

## 2019-10-21 LAB — VITAMIN B12: Vitamin B-12: 1411 pg/mL — ABNORMAL HIGH (ref 180–914)

## 2019-10-21 LAB — IRON AND TIBC
Iron: 64 ug/dL (ref 28–170)
Saturation Ratios: 14 % (ref 10.4–31.8)
TIBC: 458 ug/dL — ABNORMAL HIGH (ref 250–450)
UIBC: 394 ug/dL

## 2019-10-21 LAB — GLUCOSE, CAPILLARY
Glucose-Capillary: 155 mg/dL — ABNORMAL HIGH (ref 70–99)
Glucose-Capillary: 212 mg/dL — ABNORMAL HIGH (ref 70–99)

## 2019-10-21 LAB — FOLATE: Folate: 10 ng/mL (ref >5.9)

## 2019-10-21 MED ORDER — BUDESONIDE 0.25 MG/2ML IN SUSP: 0.2500 mg | Freq: Two times a day (BID) | RESPIRATORY_TRACT | 12 refills | Status: DC

## 2019-10-21 MED ORDER — DOXYCYCLINE HYCLATE 100 MG PO TABS: 100.0000 mg | ORAL_TABLET | Freq: Two times a day (BID) | ORAL | 0 refills | Status: AC

## 2019-10-21 MED ORDER — ARFORMOTEROL TARTRATE 15 MCG/2ML IN NEBU: 15.0000 ug | INHALATION_SOLUTION | Freq: Two times a day (BID) | RESPIRATORY_TRACT | 0 refills | Status: DC

## 2019-10-21 MED ORDER — PREDNISONE 20 MG PO TABS: ORAL_TABLET | ORAL | 0 refills | Status: AC

## 2019-10-21 MED ORDER — FERROUS SULFATE 325 (65 FE) MG PO TABS: 325.0000 mg | ORAL_TABLET | Freq: Two times a day (BID) | ORAL | Status: DC

## 2019-10-21 MED ORDER — SODIUM CHLORIDE 0.9 % IV SOLN
510.0000 mg | Freq: Once | INTRAVENOUS | Status: AC
  Administered 2019-10-21: 510 mg via INTRAVENOUS
  Filled 2019-10-21: qty 510

## 2019-10-21 MED ORDER — MELATONIN 3 MG PO TABS: 3.0000 mg | ORAL_TABLET | Freq: Every day | ORAL | 0 refills | Status: DC

## 2019-10-21 NOTE — Care Management Important Message (Signed)
Important Message  Patient Details IM Letter given to Sandford Craze RN Case Manager to present to the Patient Name: Courtney [name] MRN: [id number] Date of Birth: 22-Oct-1945   Medicare Important Message Given:  Yes     Caren Macadam 10/21/2019, 9:34 AM

## 2019-10-21 NOTE — TOC Transition Note (Signed)
Transition of Care [facility]) - CM/SW Discharge Note   Patient Details  Name: Courtney [name] MRN: [id number] Date of Birth: 07-30-1945  Transition of Care [facility]) CM/SW Contact:  Bartholome Bill, RN Phone Number: 10/21/2019, 11:45 AM   Clinical Narrative:     Pt to dc back to [place] today where she is a LTC resident. DNR on chart for MD signature. [facility] will be called for transport. RN to call report to [phone].

## 2019-10-21 NOTE — Discharge Summary (Signed)
Physician Discharge Summary  Courtney [name] UDJ:[id number] DOB: June 07, 1945 DOA: 10/18/2019  PCP: Jordan Hawks, PA-C  Admit date: 10/18/2019 Discharge date: 10/21/2019  Time spent: 45 minutes  Recommendations for Outpatient Follow-up:  1. Steroid taper prescribed on discharge please follow instructions 2. Please complete doxycycline on discharge 3. Would consider limiting fluid intake 4. Does need outpatient follow-up with endocrinologist 5. Please turn every 2 hours as possible at facility [facility] where she will return 6. Patient will need colostomy revision?  Please consider referral back to her surgeon 7. Would recommend outpatient PFTs as possible 8. Recommend IV iron infusions every 3 to 4 weeks-we will receive IV iron on discharge and note addition of ferrous sulfate to meds 9. Does require Chem-12 and CBC in 1 week 10. Does require chest x-ray in about 3 weeks to do note clearing 11. Note addition this admission of inhaled corticosteroid and inhaled long-acting beta agonist which she will need at the facility  Discharge Diagnoses:  Principal Problem:   Acute on chronic respiratory failure with hypoxia (HCC) Active Problems:   COPD with acute exacerbation (HCC)   New onset of congestive heart failure (HCC)   Multifocal pneumonia   Hyperkalemia   Discharge Condition: Improved  Diet recommendation: Diabetic heart healthy  Filed Weights   10/19/19 0206 10/20/19 0634 10/21/19 0511  Weight: 96.7 kg 100.7 kg 98.3 kg    History of present illness:  74 year old black female History of MI 02/11/2010 status post DES Dr. Eldridge Dace Fournier's gangrene 06/2018 status post multiple surgeries at that time with discharge to LTAC--status post diverting colostomy to prevent further issue and contamination of wound DM TY 2 since 2009 followed by endocrinology Dr. Crista Curb Prior tobacco Undifferentiated COPD/asthma HTN BMI >40 Prior pressure ulcers  Present from [place] [facility] ED 10/18/2019 S OB, O2 sat 88%-?  COPD exacerbation new oxygen requirement Rx Solu-Medrol, oxygen BNP elevated 409 troponin negative  Hospital Course:  1. Acute multifactorial mMRC dyspnea stage IV i. DDX COPD exacerb vs Healthcare acquired PNA ii. Not swollen-do not think  She has AEchf-rec'd 1 dose lasix last pm iii. Chest x-ray my overview 6/20 shows mild improvement however slight consolidation ongoing right lung fields posterolaterally iv. Was transitioned from IV antibiotics to doxycycline which she will complete in the outpatient v. IS q 2 while awake a. Likely COPD despite elevated BNP 2. DM TY 2, A1c 7.8 a. uncontrolled 2/2 steroids-sugars however improved 187--344 b. Increase Lantus to 12 units from 5 units on 6/20 c. Resumed home regimen on discharge with de-escalating steroids will not need further strict control 3. Anemia of iron deficiency microcytic a. Iron levels and saturations were low this admission iron was 64 saturation was 14 b. Was given a dose of Feraheme prior to discharge c. Started on ferrous sulfate d. Please consider as an outpatient further iron infusions 4. COPD-prior smoker-no spirometry in system a. IV Solu-Medrol changed to prednisone 60 b. Add Brovana c. Add back fluticasone 5. HTN a. Resumed Metoprolol amlodipine 6. BMI 40 7. Prior large surgery 07/19/2018 gangrene + Colostomy LLQ a. No stool in bag b. Continue MiraLAX add Senokot twice daily today 8. Hyperkalemia on admission 9. Mild azotemia secondary to Lasix/lisinopril likely a. Stable creatinine b. Stable fluid balance1 10. OSA on CPAP a. She was not tolerating on discharge   Consultations:  None  Discharge Exam: Vitals:   10/21/19 0511 10/21/19 0943  BP: 140/67   Pulse: 71   Resp: 17  Temp: 98.2 F (36.8 C)   SpO2: 90% 97%    General: Alert coherent no distress EOMI NCAT eating drinking feels better wheezes improved no chest pain EOMI NCAT no focal  deficit Cardiovascular: S1-S2 no murmur rub or gallop Respiratory: Clinically clear but does have transmitted upper breath sounds Abdomen soft nontender no rebound no guarding ROM is intact however limited by weakness in lower extremities  Discharge Instructions   Discharge Instructions    Diet - low sodium heart healthy   Complete by: As directed    Increase activity slowly   Complete by: As directed      Allergies as of 10/21/2019      Reactions   Penicillins    Did it involve swelling of the face/tongue/throat, SOB, or low BP? No Did it involve sudden or severe rash/hives, skin peeling, or any reaction on the inside of your mouth or nose? Yes Did you need to seek medical attention at a hospital or doctor's office? yes When did it last happen?more than 10 years If all above answers are "NO", may proceed with cephalosporin use.      Medication List    STOP taking these medications   insulin aspart 100 UNIT/ML injection Commonly known as: novoLOG   lisinopril 10 MG tablet Commonly known as: ZESTRIL   oxyCODONE 5 MG immediate release tablet Commonly known as: Oxy IR/ROXICODONE   potassium chloride 10 MEQ tablet Commonly known as: KLOR-CON   potassium chloride SA 20 MEQ tablet Commonly known as: KLOR-CON   rosuvastatin 20 MG tablet Commonly known as: CRESTOR   sodium hypochlorite external solution Commonly known as: DAKIN'S 1/2 STRENGTH     TAKE these medications   acetaminophen 650 MG CR tablet Commonly known as: TYLENOL Take 650 mg by mouth every 6 (six) hours as needed for pain.   amLODipine 10 MG tablet Commonly known as: NORVASC Take 1 tablet (10 mg total) by mouth daily.   arformoterol 15 MCG/2ML Nebu Commonly known as: BROVANA Take 2 mLs (15 mcg total) by nebulization 2 (two) times daily.   aspirin 81 MG EC tablet Take 1 tablet (81 mg total) by mouth daily.   atorvastatin 80 MG tablet Commonly known as: LIPITOR Take 80 mg by mouth at  bedtime.   budesonide 0.25 MG/2ML nebulizer solution Commonly known as: PULMICORT Take 2 mLs (0.25 mg total) by nebulization 2 (two) times daily.   D-5000 125 MCG (5000 UT) Tabs Generic drug: Cholecalciferol Take 1 tablet by mouth daily.   doxycycline 100 MG tablet Commonly known as: VIBRA-TABS Take 1 tablet (100 mg total) by mouth every 12 (twelve) hours for 2 days.   enoxaparin 40 MG/0.4ML injection Commonly known as: LOVENOX Inject 40 mg into the skin daily.   famotidine 20 MG tablet Commonly known as: PEPCID Take 20 mg by mouth 2 (two) times daily.   ferrous sulfate 325 (65 FE) MG tablet Take 1 tablet (325 mg total) by mouth 2 (two) times daily with a meal.   fluticasone 50 MCG/ACT nasal spray Commonly known as: FLONASE Place 1 spray into both nostrils daily.   insulin lispro 100 UNIT/ML injection Commonly known as: HUMALOG Inject 0-10 Units into the skin as directed. Per sliding scale. 0 units if <150 151-200: 2 units 201-250: 4 units 251-300: 6 units 301-350: 8 units 351-400: 10 units   ipratropium-albuterol 0.5-2.5 (3) MG/3ML Soln Commonly known as: DUONEB Take 3 mLs by nebulization every 6 (six) hours as needed.   Lantus SoloStar 100  UNIT/ML Solostar Pen Generic drug: insulin glargine Inject 12 Units into the skin at bedtime.   melatonin 3 MG Tabs tablet Take 1 tablet (3 mg total) by mouth at bedtime.   memantine 5 MG tablet Commonly known as: NAMENDA Take 5 mg by mouth at bedtime.   metoprolol succinate 25 MG 24 hr tablet Commonly known as: TOPROL-XL Take 25 mg by mouth daily. What changed: Another medication with the same name was removed. Continue taking this medication, and follow the directions you see here.   Ensure Max Protein Liqd Take 330 mLs (11 oz total) by mouth 2 (two) times daily.   nutrition supplement (JUVEN) Pack Take 1 packet by mouth 2 (two) times daily between meals.   polyethylene glycol 17 g packet Commonly known as: MIRALAX /  GLYCOLAX Take 17 g by mouth daily.   predniSONE 20 MG tablet Commonly known as: DELTASONE Take 3 tablets (60 mg total) by mouth daily before breakfast for 2 days, THEN 2 tablets (40 mg total) daily before breakfast for 2 days, THEN 1 tablet (20 mg total) daily before breakfast for 2 days. Start taking on: October 22, 2019   saccharomyces boulardii 250 MG capsule Commonly known as: FLORASTOR Take 250 mg by mouth 2 (two) times daily.   senna 8.6 MG Tabs tablet Commonly known as: SENOKOT Take 2 tablets by mouth daily.   sertraline 50 MG tablet Commonly known as: ZOLOFT Take 50 mg by mouth daily.      Allergies  Allergen Reactions  . Penicillins           The results of significant diagnostics from this hospitalization (including imaging, microbiology, ancillary and laboratory) are listed below for reference.    Significant Diagnostic Studies: DG Chest 2 View  Result Date: 10/19/2019 CLINICAL DATA:  Cough and shortness of breath EXAM: CHEST - 2 VIEW COMPARISON:  None. FINDINGS: Enlarged cardiac silhouette. Calcified aorta. There is fluid along the RIGHT horizontal fissure. No change from prior. Central venous congestion IMPRESSION: Cardiomegaly, central venous congestion and pleural fluid unchanged. Electronically Signed   By: Genevive Bi M.D.   On: 10/19/2019 10:15   DG Chest 2 View  Result Date: 10/18/2019 CLINICAL DATA:  Wheezing and shortness of breath EXAM: CHEST - 2 VIEW COMPARISON:  November 01, 2018 FINDINGS: There is mild cardiomegaly. Mildly increased interstitial markings seen throughout both lungs. There is hazy patchy airspace opacity seen within the right lower lung. There is a probable small left pleural effusion present. No acute osseous abnormality. IMPRESSION: Mildly increased interstitial/patchy airspace opacities throughout both lungs which may be due to edema and/or infectious etiology. Small left pleural effusion. Electronically Signed   By: Jonna Clark M.D.    On: 10/18/2019 01:20   ECHOCARDIOGRAM COMPLETE  Result Date: 10/18/2019    ECHOCARDIOGRAM REPORT   Patient Name:   YOCELYN BROCIOUS Date of Exam: 10/18/2019 Medical Rec #:  [id number]       Height:       62.0 in Accession #:    [id number]      Weight:       213.2 lb Date of Birth:  Feb 28, 1946        BSA:          1.964 m Patient Age:    74 years        BP:           151/63 mmHg Patient Gender: F  HR:           74 bpm. Exam Location:  Inpatient Procedure: 2D Echo Indications:    CHF-Acute Systolic 428.21 / I50.21  History:        Patient has prior history of Echocardiogram examinations, most                 recent 01/17/2013. CAD and Previous Myocardial Infarction; Risk                 Factors:Hypertension, Dyslipidemia, Diabetes and Current Smoker.  Sonographer:    Leta Jungling RDCS Referring Phys: [id number] [facility] RATHORE  Sonographer Comments: Image acquisition challenging due to patient body habitus and Image acquisition challenging due to COPD. IMPRESSIONS  1. Left ventricular ejection fraction, by estimation, is 60 to 65%. The left ventricle has normal function. The left ventricle has no regional wall motion abnormalities. There is moderate asymmetric left ventricular hypertrophy of the basal-septal segment. Left ventricular diastolic parameters are consistent with Grade II diastolic dysfunction (pseudonormalization). Elevated left atrial pressure.  2. Right ventricular systolic function is normal. The right ventricular size is normal. There is moderately elevated pulmonary artery systolic pressure. The estimated right ventricular systolic pressure is 48.4 mmHg.  3. The mitral valve is normal in structure. Trivial mitral valve regurgitation.  4. The aortic valve is tricuspid. Aortic valve regurgitation is not visualized. Mild aortic valve sclerosis is present, with no evidence of aortic valve stenosis.  5. The inferior vena cava is normal in size with <50% respiratory variability, suggesting  right atrial pressure of 8 mmHg. FINDINGS  Left Ventricle: Left ventricular ejection fraction, by estimation, is 60 to 65%. The left ventricle has normal function. The left ventricle has no regional wall motion abnormalities. The left ventricular internal cavity size was normal in size. There is  moderate asymmetric left ventricular hypertrophy of the basal-septal segment. Left ventricular diastolic parameters are consistent with Grade II diastolic dysfunction (pseudonormalization). Elevated left atrial pressure. Right Ventricle: The right ventricular size is normal. No increase in right ventricular wall thickness. Right ventricular systolic function is normal. There is moderately elevated pulmonary artery systolic pressure. The tricuspid regurgitant velocity is 3.18 m/s, and with an assumed right atrial pressure of 8 mmHg, the estimated right ventricular systolic pressure is 48.4 mmHg. Left Atrium: Left atrial size was normal in size. Right Atrium: Right atrial size was not well visualized. Pericardium: Trivial pericardial effusion is present. Presence of pericardial fat pad. Mitral Valve: The mitral valve is normal in structure. Trivial mitral valve regurgitation. Tricuspid Valve: The tricuspid valve is normal in structure. Tricuspid valve regurgitation is trivial. Aortic Valve: The aortic valve is tricuspid. Aortic valve regurgitation is not visualized. Mild aortic valve sclerosis is present, with no evidence of aortic valve stenosis. Pulmonic Valve: The pulmonic valve was not well visualized. Pulmonic valve regurgitation is not visualized. Aorta: The aortic root and ascending aorta are structurally normal, with no evidence of dilitation. Venous: The inferior vena cava is normal in size with less than 50% respiratory variability, suggesting right atrial pressure of 8 mmHg. IAS/Shunts: No atrial level shunt detected by color flow Doppler.  LEFT VENTRICLE PLAX 2D LVIDd:         4.60 cm  Diastology LVIDs:          2.90 cm  LV e' lateral:   5.55 cm/s LV PW:         0.80 cm  LV E/e' lateral: 18.6 LV IVS:  1.10 cm  LV e' medial:    5.98 cm/s LVOT diam:     1.60 cm  LV E/e' medial:  17.2 LV SV:         53 LV SV Index:   27 LVOT Area:     2.01 cm  RIGHT VENTRICLE RV S prime:     10.30 cm/s RVOT diam:      1.70 cm TAPSE (M-mode): 2.0 cm LEFT ATRIUM           Index       RIGHT ATRIUM           Index LA diam:      3.60 cm 1.83 cm/m  RA Area:     13.10 cm LA Vol (A2C): 47.4 ml 24.13 ml/m RA Volume:   31.10 ml  15.83 ml/m LA Vol (A4C): 56.2 ml 28.61 ml/m  AORTIC VALVE LVOT Vmax:   111.00 cm/s LVOT Vmean:  77.400 cm/s LVOT VTI:    0.262 m  AORTA Ao Root diam: 2.90 cm MITRAL VALVE                TRICUSPID VALVE MV Area (PHT): 6.37 cm     TR Peak grad:   40.4 mmHg MV Decel Time: 119 msec     TR Vmax:        318.00 cm/s MV E velocity: 103.00 cm/s MV A velocity: 116.00 cm/s  SHUNTS MV E/A ratio:  0.89         Systemic VTI:  0.26 m                             Systemic Diam: 1.60 cm                             Pulmonic Diam: 1.70 cm Epifanio Lesches MD Electronically signed by Epifanio Lesches MD Signature Date/Time: 10/18/2019/1:22:21 PM    Final     Microbiology: Recent Results (from the past 240 hour(s))  SARS Coronavirus 2 by RT PCR (hospital order, performed in [facility] hospital lab) Nasopharyngeal Nasopharyngeal Swab     Status: None   Collection Time: 10/18/19  1:29 AM   Specimen: Nasopharyngeal Swab  Result Value Ref Range Status   SARS Coronavirus 2 NEGATIVE NEGATIVE Final    Comment: (NOTE) SARS-CoV-2 target nucleic acids are NOT DETECTED.  The SARS-CoV-2 RNA is generally detectable in upper and lower respiratory specimens during the acute phase of infection. The lowest concentration of SARS-CoV-2 viral copies this assay can detect is 250 copies / mL. A negative result does not preclude SARS-CoV-2 infection and should not be used as the sole basis for treatment or other patient management  decisions.  A negative result may occur with improper specimen collection / handling, submission of specimen other than nasopharyngeal swab, presence of viral mutation(s) within the areas targeted by this assay, and inadequate number of viral copies (<250 copies / mL). A negative result must be combined with clinical observations, patient history, and epidemiological information.  Fact Sheet for Patients:   BoilerBrush.com.cy  Fact Sheet for Healthcare Providers: https://pope.com/  This test is not yet approved or  cleared by the [place] FDA and has been authorized for detection and/or diagnosis of SARS-CoV-2 by FDA under an Emergency Use Authorization (EUA).  This EUA will remain in effect (meaning this test can be used) for the duration of the COVID-19  declaration under Section 564(b)(1) of the Act, 21 U.S.C. section 360bbb-3(b)(1), unless the authorization is terminated or revoked sooner.  Performed at [facility], [place] [address] [place], [place] [address]   MRSA PCR Screening     Status: None   Collection Time: 10/18/19  6:59 AM   Specimen: Nasopharyngeal  Result Value Ref Range Status   MRSA by PCR NEGATIVE NEGATIVE Final    Comment:        The GeneXpert MRSA Assay (FDA approved for NASAL specimens only), is one component of a comprehensive MRSA colonization surveillance program. It is not intended to diagnose MRSA infection nor to guide or monitor treatment for MRSA infections. Performed at [facility], [place] [address] [place], [place] [address]      Labs: Basic Metabolic Panel: Recent Labs  Lab 10/18/19 0051 10/18/19 0500 10/19/19 0937 10/20/19 0550 10/21/19 0541  NA 139 141 137 135 137  K 5.9* 4.0 4.9 4.5 3.8  CL 107 112* 102 100 100  CO2 28 24 25 24 27   GLUCOSE 142* 190* 511* 237* 187*  BUN 25* 22 34* 30* 22  CREATININE 0.76 0.46 0.70 0.60 0.47  CALCIUM 9.3  8.1* 9.5 9.7 9.6   Liver Function Tests: Recent Labs  Lab 10/19/19 0937 10/20/19 0550 10/21/19 0541  AST 17 15 15   ALT 22 21 21   ALKPHOS 149* 147* 135*  BILITOT 0.3 0.3 0.4  PROT 6.8 7.3 6.4*  ALBUMIN 3.3* 3.5 3.2*   No results for input(s): LIPASE, AMYLASE in the last 168 hours. No results for input(s): AMMONIA in the last 168 hours. CBC: Recent Labs  Lab 10/18/19 0051 10/19/19 0643 10/20/19 0550 10/21/19 0541  WBC 7.1 11.0* 15.4* 14.1*  NEUTROABS  --  8.7* 11.6* 9.6*  HGB 9.2* 9.2* 9.2* 8.9*  HCT 33.3* 31.7* 32.8* 30.7*  MCV 81.2 79.1* 78.7* 76.6*  PLT 189 191 225 241   Cardiac Enzymes: No results for input(s): CKTOTAL, CKMB, CKMBINDEX, TROPONINI in the last 168 hours. BNP: BNP (last 3 results) Recent Labs    10/18/19 0126  BNP 403.5*    ProBNP (last 3 results) No results for input(s): PROBNP in the last 8760 hours.  CBG: Recent Labs  Lab 10/20/19 0744 10/20/19 1154 10/20/19 1647 10/20/19 2147 10/21/19 0747  GLUCAP 187* 344* 369* 347* 155*       Signed:  Nita Sells MD   [facility] 10/21/2019, 9:59 AM

## 2021-02-03 ENCOUNTER — Emergency Department (HOSPITAL_COMMUNITY): Payer: Medicare (Managed Care)

## 2021-02-03 ENCOUNTER — Encounter (HOSPITAL_COMMUNITY): Payer: Self-pay

## 2021-02-03 ENCOUNTER — Other Ambulatory Visit: Payer: Self-pay

## 2021-02-03 ENCOUNTER — Inpatient Hospital Stay (HOSPITAL_COMMUNITY)
Admission: EM | Admit: 2021-02-03 | Discharge: 2021-02-13 | DRG: 190 | Disposition: A | Discharge status: Skilled Nursing Facility | Payer: Medicare (Managed Care) | Source: Skilled Nursing Facility | Attending: Family Medicine | Admitting: Family Medicine

## 2021-02-03 ENCOUNTER — Encounter: Payer: Self-pay | Admitting: Oncology

## 2021-02-03 DIAGNOSIS — A419 Sepsis, unspecified organism: Secondary | ICD-10-CM | POA: Diagnosis not present

## 2021-02-03 DIAGNOSIS — I10 Essential (primary) hypertension: Secondary | ICD-10-CM | POA: Diagnosis present

## 2021-02-03 DIAGNOSIS — E1165 Type 2 diabetes mellitus with hyperglycemia: Secondary | ICD-10-CM | POA: Diagnosis present

## 2021-02-03 DIAGNOSIS — E870 Hyperosmolality and hypernatremia: Secondary | ICD-10-CM | POA: Diagnosis present

## 2021-02-03 DIAGNOSIS — J9601 Acute respiratory failure with hypoxia: Secondary | ICD-10-CM | POA: Diagnosis present

## 2021-02-03 DIAGNOSIS — E0781 Sick-euthyroid syndrome: Secondary | ICD-10-CM | POA: Diagnosis present

## 2021-02-03 DIAGNOSIS — R1310 Dysphagia, unspecified: Secondary | ICD-10-CM

## 2021-02-03 DIAGNOSIS — Z7951 Long term (current) use of inhaled steroids: Secondary | ICD-10-CM

## 2021-02-03 DIAGNOSIS — K259 Gastric ulcer, unspecified as acute or chronic, without hemorrhage or perforation: Secondary | ICD-10-CM | POA: Diagnosis present

## 2021-02-03 DIAGNOSIS — B9562 Methicillin resistant Staphylococcus aureus infection as the cause of diseases classified elsewhere: Secondary | ICD-10-CM | POA: Diagnosis present

## 2021-02-03 DIAGNOSIS — Z6841 Body Mass Index (BMI) 40.0 and over, adult: Secondary | ICD-10-CM

## 2021-02-03 DIAGNOSIS — R6520 Severe sepsis without septic shock: Secondary | ICD-10-CM | POA: Diagnosis not present

## 2021-02-03 DIAGNOSIS — Z8249 Family history of ischemic heart disease and other diseases of the circulatory system: Secondary | ICD-10-CM

## 2021-02-03 DIAGNOSIS — G9341 Metabolic encephalopathy: Secondary | ICD-10-CM | POA: Diagnosis present

## 2021-02-03 DIAGNOSIS — Z66 Do not resuscitate: Secondary | ICD-10-CM | POA: Diagnosis present

## 2021-02-03 DIAGNOSIS — I110 Hypertensive heart disease with heart failure: Secondary | ICD-10-CM | POA: Diagnosis present

## 2021-02-03 DIAGNOSIS — F17210 Nicotine dependence, cigarettes, uncomplicated: Secondary | ICD-10-CM | POA: Diagnosis present

## 2021-02-03 DIAGNOSIS — E119 Type 2 diabetes mellitus without complications: Secondary | ICD-10-CM

## 2021-02-03 DIAGNOSIS — D509 Iron deficiency anemia, unspecified: Secondary | ICD-10-CM | POA: Diagnosis present

## 2021-02-03 DIAGNOSIS — Z79899 Other long term (current) drug therapy: Secondary | ICD-10-CM

## 2021-02-03 DIAGNOSIS — E785 Hyperlipidemia, unspecified: Secondary | ICD-10-CM | POA: Diagnosis present

## 2021-02-03 DIAGNOSIS — Z7982 Long term (current) use of aspirin: Secondary | ICD-10-CM | POA: Diagnosis not present

## 2021-02-03 DIAGNOSIS — Z933 Colostomy status: Secondary | ICD-10-CM | POA: Diagnosis not present

## 2021-02-03 DIAGNOSIS — N3000 Acute cystitis without hematuria: Secondary | ICD-10-CM | POA: Diagnosis not present

## 2021-02-03 DIAGNOSIS — I5033 Acute on chronic diastolic (congestive) heart failure: Secondary | ICD-10-CM | POA: Diagnosis present

## 2021-02-03 DIAGNOSIS — Z7984 Long term (current) use of oral hypoglycemic drugs: Secondary | ICD-10-CM

## 2021-02-03 DIAGNOSIS — J189 Pneumonia, unspecified organism: Secondary | ICD-10-CM | POA: Diagnosis present

## 2021-02-03 DIAGNOSIS — I252 Old myocardial infarction: Secondary | ICD-10-CM

## 2021-02-03 DIAGNOSIS — R1319 Other dysphagia: Secondary | ICD-10-CM | POA: Diagnosis not present

## 2021-02-03 DIAGNOSIS — Z794 Long term (current) use of insulin: Secondary | ICD-10-CM | POA: Diagnosis not present

## 2021-02-03 DIAGNOSIS — K2270 Barrett's esophagus without dysplasia: Secondary | ICD-10-CM | POA: Diagnosis present

## 2021-02-03 DIAGNOSIS — J441 Chronic obstructive pulmonary disease with (acute) exacerbation: Secondary | ICD-10-CM | POA: Diagnosis present

## 2021-02-03 DIAGNOSIS — N390 Urinary tract infection, site not specified: Secondary | ICD-10-CM | POA: Diagnosis present

## 2021-02-03 DIAGNOSIS — Z79890 Hormone replacement therapy (postmenopausal): Secondary | ICD-10-CM | POA: Diagnosis not present

## 2021-02-03 DIAGNOSIS — Z20822 Contact with and (suspected) exposure to covid-19: Secondary | ICD-10-CM | POA: Diagnosis present

## 2021-02-03 DIAGNOSIS — K3189 Other diseases of stomach and duodenum: Secondary | ICD-10-CM | POA: Diagnosis not present

## 2021-02-03 DIAGNOSIS — Y95 Nosocomial condition: Secondary | ICD-10-CM | POA: Diagnosis present

## 2021-02-03 DIAGNOSIS — I5043 Acute on chronic combined systolic (congestive) and diastolic (congestive) heart failure: Secondary | ICD-10-CM | POA: Diagnosis present

## 2021-02-03 DIAGNOSIS — J440 Chronic obstructive pulmonary disease with acute lower respiratory infection: Secondary | ICD-10-CM | POA: Diagnosis present

## 2021-02-03 DIAGNOSIS — R0600 Dyspnea, unspecified: Secondary | ICD-10-CM

## 2021-02-03 DIAGNOSIS — I5031 Acute diastolic (congestive) heart failure: Secondary | ICD-10-CM | POA: Diagnosis not present

## 2021-02-03 DIAGNOSIS — K222 Esophageal obstruction: Secondary | ICD-10-CM | POA: Diagnosis present

## 2021-02-03 LAB — CBC WITH DIFFERENTIAL/PLATELET
Abs Immature Granulocytes: 0.06 10*3/uL (ref 0.00–0.07)
Basophils Absolute: 0 10*3/uL (ref 0.0–0.1)
Basophils Relative: 0 %
Eosinophils Absolute: 0 10*3/uL (ref 0.0–0.5)
Eosinophils Relative: 0 %
HCT: 51.4 % — ABNORMAL HIGH (ref 36.0–46.0)
Hemoglobin: 14.6 g/dL (ref 12.0–15.0)
Immature Granulocytes: 1 %
Lymphocytes Relative: 10 %
Lymphs Abs: 1.2 10*3/uL (ref 0.7–4.0)
MCH: 25.4 pg — ABNORMAL LOW (ref 26.0–34.0)
MCHC: 28.4 g/dL — ABNORMAL LOW (ref 30.0–36.0)
MCV: 89.5 fL (ref 80.0–100.0)
Monocytes Absolute: 1 10*3/uL (ref 0.1–1.0)
Monocytes Relative: 9 %
Neutro Abs: 9.9 10*3/uL — ABNORMAL HIGH (ref 1.7–7.7)
Neutrophils Relative %: 80 %
Platelets: 197 10*3/uL (ref 150–400)
RBC: 5.74 MIL/uL — ABNORMAL HIGH (ref 3.87–5.11)
RDW: 16.2 % — ABNORMAL HIGH (ref 11.5–15.5)
WBC: 12.2 10*3/uL — ABNORMAL HIGH (ref 4.0–10.5)
nRBC: 0.2 % (ref 0.0–0.2)

## 2021-02-03 LAB — COMPREHENSIVE METABOLIC PANEL
ALT: 58 U/L — ABNORMAL HIGH (ref 0–44)
AST: 31 U/L (ref 15–41)
Albumin: 3.1 g/dL — ABNORMAL LOW (ref 3.5–5.0)
Alkaline Phosphatase: 142 U/L — ABNORMAL HIGH (ref 38–126)
Anion gap: 8 (ref 5–15)
BUN: 35 mg/dL — ABNORMAL HIGH (ref 8–23)
CO2: 29 mmol/L (ref 22–32)
Calcium: 10.3 mg/dL (ref 8.9–10.3)
Chloride: 109 mmol/L (ref 98–111)
Creatinine, Ser: 0.88 mg/dL (ref 0.44–1.00)
GFR, Estimated: >60 mL/min (ref >60)
Glucose, Bld: 138 mg/dL — ABNORMAL HIGH (ref 70–99)
Potassium: 5.1 mmol/L (ref 3.5–5.1)
Sodium: 146 mmol/L — ABNORMAL HIGH (ref 135–145)
Total Bilirubin: 0.4 mg/dL (ref 0.3–1.2)
Total Protein: 7.2 g/dL (ref 6.5–8.1)

## 2021-02-03 LAB — PROTIME-INR
INR: 1 (ref 0.8–1.2)
Prothrombin Time: 13.5 seconds (ref 11.4–15.2)

## 2021-02-03 LAB — APTT: aPTT: 24 seconds (ref 24–36)

## 2021-02-03 LAB — BRAIN NATRIURETIC PEPTIDE: B Natriuretic Peptide: 725.1 pg/mL — ABNORMAL HIGH (ref 0.0–100.0)

## 2021-02-03 LAB — LACTIC ACID, PLASMA: Lactic Acid, Venous: 1 mmol/L (ref 0.5–1.9)

## 2021-02-03 MED ORDER — SODIUM CHLORIDE 0.9 % IV SOLN
2.0000 g | Freq: Once | INTRAVENOUS | Status: AC
  Administered 2021-02-04: 2 g via INTRAVENOUS
  Filled 2021-02-03: qty 20

## 2021-02-03 MED ORDER — MAGNESIUM SULFATE 2 GM/50ML IV SOLN
2.0000 g | Freq: Once | INTRAVENOUS | Status: AC
  Administered 2021-02-03: 2 g via INTRAVENOUS
  Filled 2021-02-03: qty 50

## 2021-02-03 MED ORDER — ALBUTEROL SULFATE (2.5 MG/3ML) 0.083% IN NEBU: 2.5000 mg | INHALATION_SOLUTION | RESPIRATORY_TRACT | Status: DC | PRN

## 2021-02-03 MED ORDER — METHYLPREDNISOLONE SODIUM SUCC 125 MG IJ SOLR
80.0000 mg | Freq: Two times a day (BID) | INTRAMUSCULAR | Status: DC
  Administered 2021-02-04 – 2021-02-06 (×5): 80 mg via INTRAVENOUS
  Filled 2021-02-03 (×5): qty 2

## 2021-02-03 MED ORDER — ACETAMINOPHEN 325 MG PO TABS
650.0000 mg | ORAL_TABLET | Freq: Four times a day (QID) | ORAL | Status: DC | PRN
  Administered 2021-02-06 – 2021-02-07 (×2): 650 mg via ORAL
  Filled 2021-02-03 (×2): qty 2

## 2021-02-03 MED ORDER — SODIUM CHLORIDE 0.9 % IV SOLN
500.0000 mg | Freq: Once | INTRAVENOUS | Status: AC
  Administered 2021-02-04: 500 mg via INTRAVENOUS
  Filled 2021-02-03: qty 500

## 2021-02-03 MED ORDER — FUROSEMIDE 10 MG/ML IJ SOLN: 20.0000 mg | Freq: Once | INTRAMUSCULAR | Status: DC

## 2021-02-03 MED ORDER — ACETAMINOPHEN 650 MG RE SUPP: 650.0000 mg | Freq: Four times a day (QID) | RECTAL | Status: DC | PRN

## 2021-02-03 MED ORDER — IPRATROPIUM-ALBUTEROL 0.5-2.5 (3) MG/3ML IN SOLN
3.0000 mL | RESPIRATORY_TRACT | Status: AC
Start: 2021-02-03 — End: 2021-02-03
  Administered 2021-02-03 (×3): 3 mL via RESPIRATORY_TRACT
  Filled 2021-02-03: qty 3
  Filled 2021-02-03: qty 6

## 2021-02-03 MED ORDER — METHYLPREDNISOLONE SODIUM SUCC 125 MG IJ SOLR
125.0000 mg | Freq: Once | INTRAMUSCULAR | Status: AC
  Administered 2021-02-03: 125 mg via INTRAVENOUS
  Filled 2021-02-03: qty 2

## 2021-02-03 MED ORDER — IPRATROPIUM-ALBUTEROL 0.5-2.5 (3) MG/3ML IN SOLN
3.0000 mL | Freq: Four times a day (QID) | RESPIRATORY_TRACT | Status: DC
  Administered 2021-02-04: 3 mL via RESPIRATORY_TRACT
  Filled 2021-02-03: qty 3

## 2021-02-03 NOTE — ED Provider Notes (Signed)
[facility] EMERGENCY DEPARTMENT Provider Note   CSN: [id number] Arrival date & time: 02/03/21  2157     History Chief Complaint  Patient presents with   Altered Mental Status   Shortness of Breath    Courtney [name] is a 75 y.o. female.  75 yo F with a chief complaints of fatigue.  This been going on for a couple days now.  Gotten to the point where she was having trouble breathing and having trouble getting out of bed.  EMS was called and found to be hypoxic with an oxygen saturation in the mid to upper 90s.  Patient has trouble describing what has been bothering her.  Denies abdominal pain denies chest pain denies cough or congestion.  Denies fever.  The history is provided by the patient. The history is limited by a language barrier.  Altered Mental Status Severity:  Moderate Most recent episode:  Today Episode history:  Continuous Duration:  2 days Timing:  Constant Progression:  Worsening Chronicity:  New Associated symptoms: no fever, no headaches, no nausea, no palpitations and no vomiting   Shortness of Breath Associated symptoms: cough   Associated symptoms: no chest pain, no fever, no headaches, no vomiting and no wheezing       Past Medical History:  Diagnosis Date   Anemia    Arthritis    Asthma    CAD (coronary artery disease)    COPD (chronic obstructive pulmonary disease) (HCC)    Diabetes mellitus    Hyperlipidemia    Hypertension    Myocardial infarct (HCC)    Postmenopausal atrophic vaginitis    Restless leg syndrome     Patient Active Problem List   Diagnosis Date Noted   Acute on chronic respiratory failure with hypoxia (HCC) 10/18/2019   COPD with acute exacerbation (HCC) 10/18/2019   New onset of congestive heart failure (HCC) 10/18/2019   Multifocal pneumonia 10/18/2019   Hyperkalemia 10/18/2019   Uncontrolled hypertension 07/08/2018   Obesity, Class III, BMI 40-49.9 (morbid obesity) (HCC) 07/08/2018   Sepsis (HCC)     Diabetes mellitus due to underlying condition, uncontrolled, with hyperglycemia, with long-term current use of insulin (HCC) 06/28/2018   Cigarette smoker 06/28/2018   OSA and COPD overlap syndrome (HCC) 06/26/2018   Pressure injury of skin 06/26/2018   Fournier's gangrene in female 06/25/2018   Hypertensive heart disease 08/21/2016   Normocytic anemia    Old MI (myocardial infarction)    HLD (hyperlipidemia)    Postmenopausal atrophic vaginitis    Restless leg syndrome    COPD (chronic obstructive pulmonary disease) (HCC)    Arthritis    CAD (coronary artery disease)    Iron (Fe) deficiency anemia 04/05/2011    Past Surgical History:  Procedure Laterality Date   CHOLECYSTECTOMY  1970's   INCISION AND DRAINAGE ABSCESS Right 06/27/2018   Procedure: 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.;  Surgeon: Romie Levee, MD;  Location: [facility];  Service: General;  Laterality: Right;   INCISION AND DRAINAGE PERIRECTAL ABSCESS N/A 06/25/2018   Procedure: IRRIGATION AND DEBRIDEMENT PERIRECTAL ABCESS;  Surgeon: Romie Levee, MD;  Location: [facility];  Service: General;  Laterality: N/A;   IRRIGATION AND DEBRIDEMENT ABSCESS N/A 07/01/2018   Procedure: IRRIGATION AND DEBRIDEMENT ABSCESS;  Surgeon: Emelia Loron, MD;  Location: [facility];  Service: General;  Laterality: N/A;   OTHER SURGICAL HISTORY  1970's   hysterectomy   WOUND DEBRIDEMENT N/A 07/03/2018   Procedure: DEBRIDEMENT PELVIS WITH DRESSING CHANGE;  Surgeon: Emelia Loron, MD;  Location: [facility];  Service: General;  Laterality: N/A;     OB History   No obstetric history on file.     Family History  Problem Relation Age of Onset   Hypertension Brother    Heart attack Neg Hx     Social History   Tobacco Use   Smoking status: Every Day    Packs/day: 0.50    Types: Cigarettes   Smokeless tobacco: Never  Substance Use Topics   Alcohol use: No   Drug use: No    Home Medications Prior to Admission  medications   Medication Sig Start Date End Date Taking? Authorizing Provider  acetaminophen (TYLENOL) 650 MG CR tablet Take 650 mg by mouth every 6 (six) hours as needed for pain.    [provider]  amLODipine (NORVASC) 10 MG tablet Take 1 tablet (10 mg total) by mouth daily. 07/09/18   Dhungel, Nishant, MD  arformoterol (BROVANA) 15 MCG/2ML NEBU Take 2 mLs (15 mcg total) by nebulization 2 (two) times daily. 10/21/19   Rhetta Mura, MD  aspirin EC 81 MG EC tablet Take 1 tablet (81 mg total) by mouth daily. 07/09/18   Dhungel, Theda Belfast, MD  atorvastatin (LIPITOR) 80 MG tablet Take 80 mg by mouth at bedtime.    [provider]  budesonide (PULMICORT) 0.25 MG/2ML nebulizer solution Take 2 mLs (0.25 mg total) by nebulization 2 (two) times daily. 10/21/19   Rhetta Mura, MD  D-5000 125 MCG (5000 UT) TABS Take 1 tablet by mouth daily. 09/19/19   [provider]  enoxaparin (LOVENOX) 40 MG/0.4ML injection Inject 40 mg into the skin daily.    [provider]  Ensure Max Protein (ENSURE MAX PROTEIN) LIQD Take 330 mLs (11 oz total) by mouth 2 (two) times daily. Patient not taking: Reported on 11/01/2018 07/08/18   Dhungel, Theda Belfast, MD  famotidine (PEPCID) 20 MG tablet Take 20 mg by mouth 2 (two) times daily.    [provider]  ferrous sulfate 325 (65 FE) MG tablet Take 1 tablet (325 mg total) by mouth 2 (two) times daily with a meal. 10/21/19   Rhetta Mura, MD  fluticasone (FLONASE) 50 MCG/ACT nasal spray Place 1 spray into both nostrils daily. 09/19/19   [provider]  insulin lispro (HUMALOG) 100 UNIT/ML injection Inject 0-10 Units into the skin as directed. Per sliding scale. 0 units if <150 151-200: 2 units 201-250: 4 units 251-300: 6 units 301-350: 8 units 351-400: 10 units 09/19/19   [provider]  ipratropium-albuterol (DUONEB) 0.5-2.5 (3) MG/3ML SOLN Take 3 mLs by nebulization every 6 (six) hours as needed. Patient not  taking: Reported on 11/01/2018 07/08/18   Dhungel, Theda Belfast, MD  LANTUS SOLOSTAR 100 UNIT/ML Solostar Pen Inject 12 Units into the skin at bedtime.  04/16/18   [provider]  melatonin 3 MG TABS tablet Take 1 tablet (3 mg total) by mouth at bedtime. 10/21/19   Rhetta Mura, MD  memantine (NAMENDA) 5 MG tablet Take 5 mg by mouth at bedtime.    [provider]  metoprolol succinate (TOPROL-XL) 25 MG 24 hr tablet Take 25 mg by mouth daily.    [provider]  nutrition supplement, JUVEN, (JUVEN) PACK Take 1 packet by mouth 2 (two) times daily between meals. Patient not taking: Reported on 11/01/2018 07/09/18   Dhungel, Theda Belfast, MD  polyethylene glycol (MIRALAX / GLYCOLAX) 17 g packet Take 17 g by mouth daily.    [provider]  saccharomyces boulardii (FLORASTOR) 250 MG capsule Take 250 mg by mouth 2 (two) times daily.    [provider]  senna (SENOKOT) 8.6 MG TABS tablet Take 2 tablets by mouth daily.    [provider]  sertraline (ZOLOFT) 50 MG tablet Take 50 mg by mouth daily.     [provider]    Allergies    Penicillins  Review of Systems   Review of Systems  Constitutional:  Positive for fatigue. Negative for chills and fever.  HENT:  Negative for congestion and rhinorrhea.   Eyes:  Negative for redness and visual disturbance.  Respiratory:  Positive for cough and shortness of breath. Negative for wheezing.   Cardiovascular:  Negative for chest pain and palpitations.  Gastrointestinal:  Negative for nausea and vomiting.  Genitourinary:  Negative for dysuria and urgency.  Musculoskeletal:  Negative for arthralgias and myalgias.  Skin:  Negative for pallor and wound.  Neurological:  Negative for dizziness and headaches.   Physical Exam Updated Vital Signs There were no vitals taken for this visit.  Physical Exam Vitals and nursing note reviewed.  Constitutional:      General: She is not in acute distress.     Appearance: She is well-developed. She is not diaphoretic.     Comments: Morbidly obese  HENT:     Head: Normocephalic and atraumatic.  Eyes:     Pupils: Pupils are equal, round, and reactive to light.  Cardiovascular:     Rate and Rhythm: Normal rate and regular rhythm.     Heart sounds: No murmur heard.   No friction rub. No gallop.  Pulmonary:     Effort: Pulmonary effort is normal.     Breath sounds: Wheezing present. No rales.     Comments: Diffuse wheezes in all fields with prolonged expiratory effort. Abdominal:     General: There is no distension.     Palpations: Abdomen is soft.     Tenderness: There is no abdominal tenderness.     Comments: Ostomy bag with stool, no surrounding pain or erythema or induration.  Musculoskeletal:        General: No tenderness.     Cervical back: Normal range of motion and neck supple.  Skin:    General: Skin is warm and dry.  Neurological:     Mental Status: She is alert and oriented to person, place, and time.  Psychiatric:        Behavior: Behavior normal.    ED Results / Procedures / Treatments   Labs (all labs ordered are listed, but only abnormal results are displayed) Labs Reviewed  URINE CULTURE  CULTURE, BLOOD (ROUTINE X 2)  CULTURE, BLOOD (ROUTINE X 2)  RESP PANEL BY RT-PCR (FLU A&B, COVID) ARPGX2  LACTIC ACID, PLASMA  LACTIC ACID, PLASMA  COMPREHENSIVE METABOLIC PANEL  CBC WITH DIFFERENTIAL/PLATELET  PROTIME-INR  APTT  URINALYSIS, ROUTINE W REFLEX MICROSCOPIC    EKG EKG Interpretation  Date/Time:  Thursday February 03 2021 22:08:58 EDT Ventricular Rate:  100 PR Interval:  228 QRS Duration: 89 QT Interval:  319 QTC Calculation: 412 R Axis:   44 Text Interpretation: Sinus tachycardia Multiple ventricular premature complexes Prolonged PR interval Borderline low voltage, extremity leads Nonspecific T abnormalities, lateral leads Minimal ST elevation, inferior leads frequent pvc Otherwise no significant change  Confirmed by Melene Plan [phone]) on 02/03/2021 10:20:20 PM  Radiology No results found.  Procedures Procedures   Medications Ordered in ED Medications  ipratropium-albuterol (DUONEB) 0.5-2.5 (  3) MG/3ML nebulizer solution 3 mL (has no administration in time range)  methylPREDNISolone sodium succinate (SOLU-MEDROL) 125 mg/2 mL injection 125 mg (has no administration in time range)  magnesium sulfate IVPB 2 g 50 mL (has no administration in time range)    ED Course  I have reviewed the triage vital signs and the nursing notes.  Pertinent labs & imaging results that were available during my care of the patient were reviewed by me and considered in my medical decision making (see chart for details).    MDM Rules/Calculators/A&P                           75 yo F with a chief complaints of not feeling well.  Going on for about 48 hours or so.  Patient found to be hypoxic with EMS.  Not on oxygen at home.  History of COPD.  Diffuse wheezes for me on exam with prolonged expiratory effort.  We will give 3 duo nebs to back-to-back steroids lab work chest x-ray reassess.  Patient with some improvement.  Will start on abx.  Discussed with medicine for admission.   CRITICAL CARE Performed by: Rae Roam   Total critical care time: 35 minutes  Critical care time was exclusive of separately billable procedures and treating other patients.  Critical care was necessary to treat or prevent imminent or life-threatening deterioration.  Critical care was time spent personally by me on the following activities: development of treatment plan with patient and/or surrogate as well as nursing, discussions with consultants, evaluation of patient's response to treatment, examination of patient, obtaining history from patient or surrogate, ordering and performing treatments and interventions, ordering and review of laboratory studies, ordering and review of radiographic studies, pulse oximetry and  re-evaluation of patient's condition.   The patients results and plan were reviewed and discussed.   Any x-rays performed were independently reviewed by myself.   Differential diagnosis were considered with the presenting HPI.  Medications  ipratropium-albuterol (DUONEB) 0.5-2.5 (3) MG/3ML nebulizer solution 3 mL (3 mLs Nebulization Given 02/03/21 2307)  cefTRIAXone (ROCEPHIN) 2 g in sodium chloride 0.9 % 100 mL IVPB (has no administration in time range)  azithromycin (ZITHROMAX) 500 mg in sodium chloride 0.9 % 250 mL IVPB (has no administration in time range)  methylPREDNISolone sodium succinate (SOLU-MEDROL) 125 mg/2 mL injection 125 mg (125 mg Intravenous Given 02/03/21 2233)  magnesium sulfate IVPB 2 g 50 mL (0 g Intravenous Stopped 02/03/21 2300)    Vitals:   02/03/21 2215 02/03/21 2240  BP: (!) 142/79   Pulse: (!) 102   Resp: (!) 24   Temp: 99.3 F (37.4 C)   TempSrc: Oral   SpO2: 97%   Weight:  107.5 kg  Height:  5' 3.5" (1.613 m)    Final diagnoses:  None    Admission/ observation were discussed with the admitting physician, patient and/or family and they are comfortable with the plan.   Final Clinical Impression(s) / ED Diagnoses Final diagnoses:  None    Rx / DC Orders ED Discharge Orders     None        Melene Plan, DO 02/03/21 2341

## 2021-02-03 NOTE — H&P (Signed)
History and Physical    PLEASE NOTE THAT DRAGON DICTATION SOFTWARE WAS USED IN THE CONSTRUCTION OF THIS NOTE.   Courtney [name] ACG:[id number] DOB: Aug 11, 1945 DOA: 02/03/2021  PCP: Jordan Hawks, PA-C Patient coming from: SNF  I have personally briefly reviewed patient's old medical records in [facility]  Chief Complaint: somnolence  HPI: Courtney [name] is a 75 y.o. female with medical history significant for COPD, type 2 diabetes mellitus, hypertension, hyperlipidemia, chronic diastolic heart failure, who is admitted to [facility] on 02/03/2021 with acute hypoxic respiratory distress after presenting from snf to [facility] ED for evaluation of increased somnolence.  In the setting of the patient's altered mental status, the following history is provided via snf staff as well as in my discussions with the EDP and via chart review.  The patient reportedly has been demonstrating evidence of 2 days of progressive somnolence associated with diminished responsiveness to verbal stimuli, including more difficult to rouse over that timeframe relative to baseline.  This is been reportedly associated with new onset nonproductive cough as well as mild wheezing, which is reportedly new for her.  She has a documented history of COPD, but no baseline supplemental oxygen requirements.  Unclear if she has been experiencing any associated recent fever/chills.  No reported preceding trauma, fall.  No reported associated hemoptysis, new lower extremity erythema, or vomiting.   Chest has a documented history of chronic diastolic heart failure, with most recent echocardiogram in June 2021 notable for appearing left ventricular cavity size, LVEF 60 to 65%, moderate LVH, no evidence of focal wall motion normalities, grade 2 diastolic dysfunction, no evidence of significant valvular pathology.  She is not on any scheduled diuretic medications as an outpatient.      ED Course:  Vital signs in the ED were  notable for the following: Temperature max 99.3; heart rate 102, blood pressure 142/79 respiratory rate 24, initial oxygen saturation 88% on room air, with ensuing improvement to 95 to 97% on 2 L nasal cannula;   Labs were notable for the following: CMP notable for the following: Bicarbonate 29, creatinine 0.88, glucose 138.  BNP 725 compared to most recent prior value 403 in June 2021.  CBC notable for white blood cell count 2200 with 80% neutrophils.  Lactic acid 1.0.  Urinalysis has been ordered, with result currently pending.  COVID-19/PCR in the ED today and found to be negative.  Blood cultures x2 collected prior to initiation of IV antibiotics.   Imaging and additional notable ED work-up: EKG, in comparison to most recent prior from 10/18/2019 shows sinus tachycardia with multiple PVCs, heart rate 100, nonspecific T wave inversion in aVL, no interval ST changes.  Chest x-ray in comparison to plain films of the chest from June 2021 showed unchanged cardiomegaly, interval increase in central pulmonary vascular congestion as well as interstitial opacities in the bilateral bases, no evidence of pleural effusion or pneumothorax.  While in the ED, the following were administered: Duo nebulizer treatment, solumedrol 125 mg IV x1, azithromycin, Rocephin magnesium sulfate 2 g IV over 2 hours x 1 dose.      Review of Systems: As per HPI otherwise 10 point review of systems negative.   Past Medical History:  Diagnosis Date   Anemia    Arthritis    Asthma    CAD (coronary artery disease)    COPD (chronic obstructive pulmonary disease) (HCC)    Diabetes mellitus    Hyperlipidemia    Hypertension  Myocardial infarct (HCC)    Postmenopausal atrophic vaginitis    Restless leg syndrome     Past Surgical History:  Procedure Laterality Date   CHOLECYSTECTOMY  1970's   INCISION AND DRAINAGE ABSCESS Right 06/27/2018   Procedure: 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.;   Surgeon: Leighton Ruff, MD;  Location: [facility];  Service: General;  Laterality: Right;   INCISION AND DRAINAGE PERIRECTAL ABSCESS N/A 06/25/2018   Procedure: IRRIGATION AND DEBRIDEMENT PERIRECTAL ABCESS;  Surgeon: Leighton Ruff, MD;  Location: [facility];  Service: General;  Laterality: N/A;   IRRIGATION AND DEBRIDEMENT ABSCESS N/A 07/01/2018   Procedure: IRRIGATION AND DEBRIDEMENT ABSCESS;  Surgeon: Rolm Bookbinder, MD;  Location: [facility];  Service: General;  Laterality: N/A;   OTHER SURGICAL HISTORY  1970's   hysterectomy   WOUND DEBRIDEMENT N/A 07/03/2018   Procedure: DEBRIDEMENT PELVIS WITH DRESSING CHANGE;  Surgeon: Rolm Bookbinder, MD;  Location: [facility];  Service: General;  Laterality: N/A;    Social History:  reports that she has been smoking. She has been smoking an average of .5 packs per day. She has never used smokeless tobacco. She reports that she does not drink alcohol and does not use drugs.   Allergies  Allergen Reactions   Penicillins     Did it involve swelling of the face/tongue/throat, SOB, or low BP? No Did it involve sudden or severe rash/hives, skin peeling, or any reaction on the inside of your mouth or nose? Yes Did you need to seek medical attention at a hospital or doctor's office? yes When did it last happen?      more than 10 years If all above answers are "NO", may proceed with cephalosporin use.     Family History  Problem Relation Age of Onset   Hypertension Brother    Heart attack Neg Hx      Prior to Admission medications   Medication Sig Start Date End Date Taking? Authorizing Provider  acetaminophen (TYLENOL) 650 MG CR tablet Take 650 mg by mouth every 6 (six) hours as needed for pain.    [provider]  amLODipine (NORVASC) 10 MG tablet Take 1 tablet (10 mg total) by mouth daily. 07/09/18   Dhungel, Nishant, MD  arformoterol (BROVANA) 15 MCG/2ML NEBU Take 2 mLs (15 mcg total) by nebulization 2 (two) times daily. 10/21/19   Nita Sells, MD  aspirin EC 81 MG EC tablet Take 1 tablet (81 mg total) by mouth daily. 07/09/18   Dhungel, Flonnie Overman, MD  atorvastatin (LIPITOR) 80 MG tablet Take 80 mg by mouth at bedtime.    [provider]  budesonide (PULMICORT) 0.25 MG/2ML nebulizer solution Take 2 mLs (0.25 mg total) by nebulization 2 (two) times daily. 10/21/19   Nita Sells, MD  D-5000 125 MCG (5000 UT) TABS Take 1 tablet by mouth daily. 09/19/19   [provider]  enoxaparin (LOVENOX) 40 MG/0.4ML injection Inject 40 mg into the skin daily.    [provider]  Ensure Max Protein (ENSURE MAX PROTEIN) LIQD Take 330 mLs (11 oz total) by mouth 2 (two) times daily. Patient not taking: Reported on 11/01/2018 07/08/18   Dhungel, Flonnie Overman, MD  famotidine (PEPCID) 20 MG tablet Take 20 mg by mouth 2 (two) times daily.    [provider]  ferrous sulfate 325 (65 FE) MG tablet Take 1 tablet (325 mg total) by mouth 2 (two) times daily with a meal. 10/21/19   Nita Sells, MD  fluticasone (FLONASE) 50 MCG/ACT nasal  spray Place 1 spray into both nostrils daily. 09/19/19   [provider]  insulin lispro (HUMALOG) 100 UNIT/ML injection Inject 0-10 Units into the skin as directed. Per sliding scale. 0 units if <150 151-200: 2 units 201-250: 4 units 251-300: 6 units 301-350: 8 units 351-400: 10 units 09/19/19   [provider]  ipratropium-albuterol (DUONEB) 0.5-2.5 (3) MG/3ML SOLN Take 3 mLs by nebulization every 6 (six) hours as needed. Patient not taking: Reported on 11/01/2018 07/08/18   Dhungel, Flonnie Overman, MD  LANTUS SOLOSTAR 100 UNIT/ML Solostar Pen Inject 12 Units into the skin at bedtime.  04/16/18   [provider]  melatonin 3 MG TABS tablet Take 1 tablet (3 mg total) by mouth at bedtime. 10/21/19   Nita Sells, MD  memantine (NAMENDA) 5 MG tablet Take 5 mg by mouth at bedtime.    [provider]  metoprolol succinate (TOPROL-XL) 25 MG 24 hr tablet Take  25 mg by mouth daily.    [provider]  nutrition supplement, JUVEN, (JUVEN) PACK Take 1 packet by mouth 2 (two) times daily between meals. Patient not taking: Reported on 11/01/2018 07/09/18   Dhungel, Flonnie Overman, MD  polyethylene glycol (MIRALAX / GLYCOLAX) 17 g packet Take 17 g by mouth daily.    [provider]  saccharomyces boulardii (FLORASTOR) 250 MG capsule Take 250 mg by mouth 2 (two) times daily.    [provider]  senna (SENOKOT) 8.6 MG TABS tablet Take 2 tablets by mouth daily.    [provider]  sertraline (ZOLOFT) 50 MG tablet Take 50 mg by mouth daily.     [provider]     Objective    Physical Exam: Vitals:   02/03/21 2215 02/03/21 2240  BP: (!) 142/79   Pulse: (!) 102   Resp: (!) 24   Temp: 99.3 F (37.4 C)   TempSrc: Oral   SpO2: 97%   Weight:  107.5 kg  Height:  5' 3.5" (1.613 m)    General: appears to be stated age; somnolent; will briefly open eyes to verbal stimuli; unable to follow instructions at this time.  Skin: warm, dry, no rash Head:  AT/ Mouth:  Oral mucosa membranes appear dry, normal dentition Neck: supple; trachea midline Heart:  RRR; did not appreciate any M/R/G Lungs: b/l rhonchi noted; did not appreciate any wheezes or rales.  Abdomen: + BS; soft, ND;  Vascular: 2+ pedal pulses b/l; 2+ radial pulses b/l Extremities: no peripheral edema, no muscle wasting Neuro: In the setting of the patient's current mental status and associated inability to follow instructions, unable to perform full neurologic exam at this time.  As such, assessment of strength, sensation, and cranial nerves is limited at this time. Patient noted to spontaneously move all 4 extremities. No tremors.     Labs on Admission: I have personally reviewed following labs and imaging studies  CBC: Recent Labs  Lab 02/03/21 2212  WBC 12.2*  NEUTROABS 9.9*  HGB 14.6  HCT 51.4*  MCV 89.5  PLT 832   Basic Metabolic  Panel: Recent Labs  Lab 02/03/21 2212  NA 146*  K 5.1  CL 109  CO2 29  GLUCOSE 138*  BUN 35*  CREATININE 0.88  CALCIUM 10.3   GFR: Estimated Creatinine Clearance: 63.6 mL/min (by C-G formula based on SCr of 0.88 mg/dL). Liver Function Tests: Recent Labs  Lab 02/03/21 2212  AST 31  ALT 58*  ALKPHOS 142*  BILITOT 0.4  PROT 7.2  ALBUMIN  3.1*   No results for input(s): LIPASE, AMYLASE in the last 168 hours. No results for input(s): AMMONIA in the last 168 hours. Coagulation Profile: Recent Labs  Lab 02/03/21 2212  INR 1.0   Cardiac Enzymes: No results for input(s): CKTOTAL, CKMB, CKMBINDEX, TROPONINI in the last 168 hours. BNP (last 3 results) No results for input(s): PROBNP in the last 8760 hours. HbA1C: No results for input(s): HGBA1C in the last 72 hours. CBG: No results for input(s): GLUCAP in the last 168 hours. Lipid Profile: No results for input(s): CHOL, HDL, LDLCALC, TRIG, CHOLHDL, LDLDIRECT in the last 72 hours. Thyroid Function Tests: No results for input(s): TSH, T4TOTAL, FREET4, T3FREE, THYROIDAB in the last 72 hours. Anemia Panel: No results for input(s): VITAMINB12, FOLATE, FERRITIN, TIBC, IRON, RETICCTPCT in the last 72 hours. Urine analysis:    Component Value Date/Time   COLORURINE AMBER (A) 06/26/2018 1335   APPEARANCEUR HAZY (A) 06/26/2018 1335   LABSPEC 1.040 (H) 06/26/2018 1335   PHURINE 6.0 06/26/2018 1335   GLUCOSEU 50 (A) 06/26/2018 1335   HGBUR NEGATIVE 06/26/2018 1335   BILIRUBINUR NEGATIVE 06/26/2018 1335   KETONESUR 5 (A) 06/26/2018 1335   PROTEINUR 30 (A) 06/26/2018 1335   UROBILINOGEN 1.0 02/11/2010 1320   NITRITE NEGATIVE 06/26/2018 1335   LEUKOCYTESUR NEGATIVE 06/26/2018 1335    Radiological Exams on Admission: DG Chest Port 1 View  Result Date: 02/03/2021 CLINICAL DATA:  Questionable sepsis. EXAM: PORTABLE CHEST 1 VIEW COMPARISON:  Chest x-ray 10/19/2019. FINDINGS: Heart is enlarged, unchanged. There is central  pulmonary vascular congestion which has increased from prior. Interstitial opacity are present in the lung bases. There is a band of atelectasis in the left mid lung. There is no pleural effusion or pneumothorax identified. No acute fractures. IMPRESSION: 1. Cardiomegaly with central pulmonary vascular congestion/mild interstitial edema. Electronically Signed   By: Darliss Cheney M.D.   On: 02/03/2021 22:27     EKG: Independently reviewed, with result as described above.    Assessment/Plan   Courtney [name] is a 75 y.o. female with medical history significant for COPD, type 2 diabetes mellitus, hypertension, hyperlipidemia, chronic diastolic heart failure, who is admitted to [facility] on 02/03/2021 with acute hypoxic respiratory distress after presenting from snf to [facility] ED for evaluation of increased somnolence.   Principal Problem:   Acute respiratory failure with hypoxia (HCC) Active Problems:   Hypertension   Acute exacerbation of chronic obstructive pulmonary disease (COPD) (HCC)   Acute on chronic diastolic CHF (congestive heart failure) (HCC)   Severe sepsis (HCC)   CAP (community acquired pneumonia)   Acute metabolic encephalopathy   DM2 (diabetes mellitus, type 2) (HCC)    #) Acute hypoxic respiratory distress: In the setting of no known baseline supplemental oxygen requirements, initial oxygen saturation noted to be in the high 80s on room air, subsequently improving into the mid to high 90s on 2 L nasal cannula, thereby meeting criteria for acute hypoxic respiratory distress as opposed to failure.  Suspect this is multifactorial in its etiology, with suspected contribution from acute COPD exacerbation stemming from potential underlying pneumonia vs potential acute on chronic diastolic heart failure, detailed below.  While there is a somewhat mixed clinical picture complicating evaluation of underlying pneumonia versus acute on chronic diastolic heart failure at this time,  there appears to be evidence of an acute COPD exacerbation, warranting additional systemic corticosteroids and scheduled breathing treatments, evaluation for contributing factors leading to this exacerbation, as further detailed below.  Of note, presenting EKG shows no interval T wave or ST changes relative to most recent prior EKG from June 2021. Will check troponin to further for ACS.  Clinically, patient appears less suggestive of acute pulmonary embolism, particularly given apparent inclusion of prophylactic Lovenox 40 mg subcu daily listed on patient's home medication list, pending inquiry via the inpatient pharmacy consult that I have placed to confirm the patient remains on this dose of prophylactic Lovenox as an outpatient.  Of note, COVID-19/influenza PCR found to be negative when checked in the ED this evening.  Plan: Monitor on telemetry.  Monitor continuous pulse oximetry.  Check serum magnesium and phosphorus levels.  Further evaluation management of suspected acute COPD exacerbation, including solumedrol, scheduled duo nebulizers, as needed albuterol nebulizers.  Check blood gas.  Further evaluation and management of potential Communicare pneumonia, including IV antibiotics, as further detailed below.  Add on procalcitonin level.  Additionally, will further evaluate for contribution from acute on chronic diastolic heart failure, including pursuit of echocardiogram in the morning.  Check troponin, as above.      #) Acute COPD exacerbation: in the context of a documented history of COPD, diagnosis of acute exacerbation in the setting of reported new onset nonproductive cough, shortness of breath, suspected to be contributing to presenting acute hypoxic respiratory distress, as above.  Additionally, in the context of concomitant acute encephalopathy, will also check blood gas to evaluate degree of hypercapnia, including assessment for associated contribution aforementioned acute encephalopathy.   In terms of factors predisposing patient to acute COPD exacerbation, suspect Communicare pneumonia versus acute on chronic diastolic heart failure, as further detailed below.  COVID-19/influenza found to be negative, as above.  Outpatient respiratory regimen appears to consist of Brovana, Pulmicort.  Of note, no baseline supplemental oxygen requirements.    Plan: monitor continuous pulse oxymetry. Monitor on telemetry. Solumedrol. Scheduled duonebs q6 hours. Prn albuterol inhaler. BMP in the morning. Repeat CBC in the morning. Check serum Mg and Phos levels. Will attempt additional chart review to evaluate most recent PFT results.  Further evaluation and management of potential underlying community-acquired pneumonia, including continuation of Rocephin and azithromycin for now.  Add on procalcitonin level.  Check blood gas.       #) Severe sepsis due to community-acquired pneumonia: Diagnosis on the basis of new onset nonproductive cough, shortness of breath, elevated temperature, leukocytosis, with chest x-ray suggestive of interstitial opacities in the bilateral lung bases.  SIRS criteria met on the basis of presenting leukocytosis, tachycardia, tachypnea.  Criteria for patient sepsis to be considered severe in nature has been on the basis of concomitant acute encephalopathy, as further detailed below as well as via presenting acute hypoxic respiratory distress no evidence of overt additional underlying infectious process at this time, including COVID-19/influenza PCR results, that were found to be negative when checked in the ED earlier this evening.  Of note, urinalysis has been ordered, with result currently pending.  No evidence of associated hypotension thus far, and initial lactate nonelevated at 1.0. In the absence of lactic acid level that is greater than or equal to 4.0, and in the absence of any associated hypotension, there are no indications for administration of a 30 mL/kg IVF bolus at this  time.  Blood cultures x2 collected in the ED this evening followed by initiation of Rocephin and IV azithromycin. Will check procalcitonin level, with consideration for pursuing CT chest to assist with differentiating between contributory pneumonia versus acute on chronic diastolic heart failure given  mixed clinical picture complicating evaluation discernment between these potential underlying pathologies.   Plan: Monitor for results of blood cultures x2.  Continue Rocephin and azithromycin.  Add on procalcitonin level.  Check serum magnesium and phosphorus levels.  Repeat CBC with differential in the morning.  Monitor continuous pulse oximetry.  Check strep pneumonia urine antigen.  Check mycoplasma antibodies.  Monitor on telemetry.  Follow-up result urinalysis.      #) Acute on chronic diastolic heart failure: In the context of a documented history of chronic diastolic heart failure, with most recent echocardiogram from June 2021 notable for grade 2 diastolic dysfunction with LVEF 60 to 65%, there is consideration for potential acutely decompensated heart at the time of this evening's presentation in the setting of elevated BNP of 725 relative to most recent prior value of 400 in June 2021, with presenting chest x-ray showing a slight interval increase in central pulmonary vascular congestion as well as potential early interstitial edema is evidence by interstitial opacities in the bilateral lung bases.  Evaluation currently limited by the patient's somnolence and limited ability to contribute to the history in order to differentiate between a clinical picture more suggestive of acutely decompensated heart failure versus pneumonia. Will check echocardiogram in the morning, and consider pursuing CT chest to assist with radiographic discernment between pneumonia versus acute decompensated heart failure findings.   Plan: Monitor strict I's and O's and daily weights.  Add on serum magnesium level.   Echocardiogram in the morning.  Monitor continuous pulse oximetry. Will provide Lasix 20 mg IV x1 dose and assess for ensuing urine output and correlate clinically with presenting acute respiratory distress for both diagnostic and therapeutic purposes.      #) Acute metabolic encephalopathy: 1 to 2 days altered mental status associated increasing somnolence relative to baseline with current differential including physiologic stress stemming from potential severe sepsis due to pneumonia, as further detailed above.  Differential also includes the possibility of hypercapnic encephalopathy in the setting of acute COPD exacerbation, as further detailed above.  No additional evidence of underlying infectious process at this time, including negative COVID-19/influenza, although urinalysis result remains pending at this time.  Acute ischemic CVA and seizures are felt to be less likely at this time.   Plan: fall precautions. CMP in the morning. Repeat CBC in the morning. check VBG to evaluate for any contribution from hypercapnic encephalopathy. Check TSH, MMA.  Check ammonia level, urine drug screen, ionized calcium level.  Further evaluation and management of suspected severe sepsis due to pneumonia, including continuation of IV antibiotics, as further detailed above.  Hold home Zoloft for now.  Follow-up for results of urinalysis.      #) Type 2 diabetes mellitus: Documented history of such, with most recent hemoglobin A1c noted to be 7.8% when checked in June 2021.  On basal insulin in the form of 12 units subcu nightly as well as sliding scale Humalog 3 times daily with meals as an outpatient.  Presenting blood sugar per presenting CMP noted to be 138, with anticipation of ensuing relative hyperglycemia in the setting of plan for additional systemic corticosteroids as component of management of presenting acute COPD exacerbation.  Plan: We will resume approximately half of outpatient basal insulin dose,  with order for Lantus 6 units subcu nightly, with next dose now.  Accu-Cheks before every meal and at bedtime with low-dose sliding scale insulin.  Repeat hemoglobin A1c level.      #) Essential hypertension: Outpatient hypertensive regimen includes  Norvasc as well as Toprol-XL.  Systolic blood pressures in the 140s thus far.  Plan: In the setting of presenting severe sepsis due to suspected community-acquired pneumonia, will hold home Norvasc for now.  However, we will continue outpatient beta-blocker, with close monitoring of ensuing blood pressure via routine vital signs.  Monitor on symmetry.     DVT prophylaxis: SCDs; patient pharmacist consulted for assistance in determining if patient remains on prophylactic Lovenox as an outpatient. Code Status: DNR Family Communication: none Disposition Plan: Per Rounding Team Consults called: none  Admission status: Inpatient;   Of note, this patient was added by me to the following Admit List/Treatment Team:  mcadmits.   Of note, the Adult Admission Order Set (Multimorbid order set) was used by me in the admission process for this patient.  PLEASE NOTE THAT DRAGON DICTATION SOFTWARE WAS USED IN THE CONSTRUCTION OF THIS NOTE.   Rhetta Mura DO [facility] Pager [phone] From [place]   02/03/2021, 11:47 PM

## 2021-02-03 NOTE — ED Triage Notes (Signed)
PER EMS: pt from [facility], staff reported that she had been lethargic for a couple of days associated with SOB, hx of COPD. Neb given by nursing home staff. Rales throughout, 4L [place] 98%. A&OX4 at baseline.   146/98, HR-102. RR-24, CBG-130

## 2021-02-04 ENCOUNTER — Other Ambulatory Visit: Payer: Self-pay

## 2021-02-04 ENCOUNTER — Inpatient Hospital Stay (HOSPITAL_COMMUNITY): Payer: Medicare (Managed Care)

## 2021-02-04 DIAGNOSIS — A419 Sepsis, unspecified organism: Secondary | ICD-10-CM | POA: Diagnosis present

## 2021-02-04 DIAGNOSIS — E119 Type 2 diabetes mellitus without complications: Secondary | ICD-10-CM

## 2021-02-04 DIAGNOSIS — G9341 Metabolic encephalopathy: Secondary | ICD-10-CM | POA: Diagnosis present

## 2021-02-04 DIAGNOSIS — J189 Pneumonia, unspecified organism: Secondary | ICD-10-CM | POA: Diagnosis present

## 2021-02-04 DIAGNOSIS — I5043 Acute on chronic combined systolic (congestive) and diastolic (congestive) heart failure: Secondary | ICD-10-CM | POA: Diagnosis present

## 2021-02-04 DIAGNOSIS — J441 Chronic obstructive pulmonary disease with (acute) exacerbation: Secondary | ICD-10-CM | POA: Diagnosis not present

## 2021-02-04 DIAGNOSIS — I5033 Acute on chronic diastolic (congestive) heart failure: Secondary | ICD-10-CM | POA: Diagnosis present

## 2021-02-04 DIAGNOSIS — J9601 Acute respiratory failure with hypoxia: Secondary | ICD-10-CM | POA: Diagnosis not present

## 2021-02-04 DIAGNOSIS — I5031 Acute diastolic (congestive) heart failure: Secondary | ICD-10-CM

## 2021-02-04 LAB — CBC WITH DIFFERENTIAL/PLATELET
Abs Immature Granulocytes: 0.07 10*3/uL (ref 0.00–0.07)
Basophils Absolute: 0 10*3/uL (ref 0.0–0.1)
Basophils Relative: 0 %
Eosinophils Absolute: 0 10*3/uL (ref 0.0–0.5)
Eosinophils Relative: 0 %
HCT: 55.2 % — ABNORMAL HIGH (ref 36.0–46.0)
Hemoglobin: 14.8 g/dL (ref 12.0–15.0)
Immature Granulocytes: 1 %
Lymphocytes Relative: 4 %
Lymphs Abs: 0.6 10*3/uL — ABNORMAL LOW (ref 0.7–4.0)
MCH: 25.2 pg — ABNORMAL LOW (ref 26.0–34.0)
MCHC: 26.8 g/dL — ABNORMAL LOW (ref 30.0–36.0)
MCV: 94 fL (ref 80.0–100.0)
Monocytes Absolute: 0.3 10*3/uL (ref 0.1–1.0)
Monocytes Relative: 2 %
Neutro Abs: 12.7 10*3/uL — ABNORMAL HIGH (ref 1.7–7.7)
Neutrophils Relative %: 93 %
Platelets: 134 10*3/uL — ABNORMAL LOW (ref 150–400)
RBC: 5.87 MIL/uL — ABNORMAL HIGH (ref 3.87–5.11)
RDW: 16.1 % — ABNORMAL HIGH (ref 11.5–15.5)
WBC: 13.7 10*3/uL — ABNORMAL HIGH (ref 4.0–10.5)
nRBC: 0.1 % (ref 0.0–0.2)

## 2021-02-04 LAB — RAPID URINE DRUG SCREEN, HOSP PERFORMED
Amphetamines: NOT DETECTED
Barbiturates: NOT DETECTED
Benzodiazepines: NOT DETECTED
Cocaine: NOT DETECTED
Opiates: NOT DETECTED
Tetrahydrocannabinol: NOT DETECTED

## 2021-02-04 LAB — BLOOD CULTURE ID PANEL (REFLEXED) - BCID2

## 2021-02-04 LAB — COMPREHENSIVE METABOLIC PANEL
ALT: 59 U/L — ABNORMAL HIGH (ref 0–44)
AST: 33 U/L (ref 15–41)
Albumin: 3.1 g/dL — ABNORMAL LOW (ref 3.5–5.0)
Alkaline Phosphatase: 138 U/L — ABNORMAL HIGH (ref 38–126)
Anion gap: 14 (ref 5–15)
BUN: 37 mg/dL — ABNORMAL HIGH (ref 8–23)
CO2: 26 mmol/L (ref 22–32)
Calcium: 10.1 mg/dL (ref 8.9–10.3)
Chloride: 106 mmol/L (ref 98–111)
Creatinine, Ser: 0.92 mg/dL (ref 0.44–1.00)
GFR, Estimated: >60 mL/min (ref >60)
Glucose, Bld: 174 mg/dL — ABNORMAL HIGH (ref 70–99)
Potassium: 5.6 mmol/L — ABNORMAL HIGH (ref 3.5–5.1)
Sodium: 146 mmol/L — ABNORMAL HIGH (ref 135–145)
Total Bilirubin: 0.6 mg/dL (ref 0.3–1.2)
Total Protein: 7.4 g/dL (ref 6.5–8.1)

## 2021-02-04 LAB — URINALYSIS, MICROSCOPIC (REFLEX)

## 2021-02-04 LAB — CBG MONITORING, ED
Glucose-Capillary: 192 mg/dL — ABNORMAL HIGH (ref 70–99)
Glucose-Capillary: 217 mg/dL — ABNORMAL HIGH (ref 70–99)
Glucose-Capillary: 226 mg/dL — ABNORMAL HIGH (ref 70–99)

## 2021-02-04 LAB — ECHOCARDIOGRAM COMPLETE
AV Mean grad: 6.5 mmHg
AV Peak grad: 11.7 mmHg
Ao pk vel: 1.71 m/s
Height: 63.5 in
S' Lateral: 2.8 cm
Weight: 3791.91 oz

## 2021-02-04 LAB — TSH: TSH: 0.2 u[IU]/mL — ABNORMAL LOW (ref 0.350–4.500)

## 2021-02-04 LAB — I-STAT VENOUS BLOOD GAS, ED
Acid-Base Excess: 4 mmol/L — ABNORMAL HIGH (ref 0.0–2.0)
Bicarbonate: 31.4 mmol/L — ABNORMAL HIGH (ref 20.0–28.0)
Calcium, Ion: 1.2 mmol/L (ref 1.15–1.40)
HCT: 48 % — ABNORMAL HIGH (ref 36.0–46.0)
Hemoglobin: 16.3 g/dL — ABNORMAL HIGH (ref 12.0–15.0)
O2 Saturation: 98 %
Potassium: 5.2 mmol/L — ABNORMAL HIGH (ref 3.5–5.1)
Sodium: 147 mmol/L — ABNORMAL HIGH (ref 135–145)
TCO2: 33 mmol/L — ABNORMAL HIGH (ref 22–32)
pCO2, Ven: 57.7 mmHg (ref 44.0–60.0)
pH, Ven: 7.344 (ref 7.250–7.430)
pO2, Ven: 111 mmHg — ABNORMAL HIGH (ref 32.0–45.0)

## 2021-02-04 LAB — URINALYSIS, ROUTINE W REFLEX MICROSCOPIC
Bilirubin Urine: NEGATIVE
Glucose, UA: NEGATIVE mg/dL
Ketones, ur: NEGATIVE mg/dL
Nitrite: POSITIVE — AB
Protein, ur: 30 mg/dL — AB
Specific Gravity, Urine: 1.025 (ref 1.005–1.030)
pH: 6 (ref 5.0–8.0)

## 2021-02-04 LAB — MAGNESIUM
Magnesium: 2.3 mg/dL (ref 1.7–2.4)
Magnesium: 2.3 mg/dL (ref 1.7–2.4)

## 2021-02-04 LAB — GLUCOSE, CAPILLARY
Glucose-Capillary: 232 mg/dL — ABNORMAL HIGH (ref 70–99)
Glucose-Capillary: 229 mg/dL — ABNORMAL HIGH (ref 70–99)

## 2021-02-04 LAB — RESP PANEL BY RT-PCR (FLU A&B, COVID) ARPGX2
Influenza A by PCR: NEGATIVE
Influenza B by PCR: NEGATIVE
SARS Coronavirus 2 by RT PCR: NEGATIVE

## 2021-02-04 LAB — HEMOGLOBIN A1C
Hgb A1c MFr Bld: 9.1 % — ABNORMAL HIGH (ref 4.8–5.6)
Mean Plasma Glucose: 214.47 mg/dL

## 2021-02-04 LAB — AMMONIA: Ammonia: 21 umol/L (ref 9–35)

## 2021-02-04 LAB — PHOSPHORUS: Phosphorus: 3.1 mg/dL (ref 2.5–4.6)

## 2021-02-04 LAB — STREP PNEUMONIAE URINARY ANTIGEN: Strep Pneumo Urinary Antigen: NEGATIVE

## 2021-02-04 LAB — PROCALCITONIN: Procalcitonin: 0.1 ng/mL

## 2021-02-04 LAB — LACTIC ACID, PLASMA: Lactic Acid, Venous: 1.3 mmol/L (ref 0.5–1.9)

## 2021-02-04 LAB — TROPONIN I (HIGH SENSITIVITY)
Troponin I (High Sensitivity): 41 ng/L — ABNORMAL HIGH (ref <18)
Troponin I (High Sensitivity): 48 ng/L — ABNORMAL HIGH (ref <18)

## 2021-02-04 MED ORDER — INSULIN GLARGINE-YFGN 100 UNIT/ML ~~LOC~~ SOLN
5.0000 [IU] | Freq: Every day | SUBCUTANEOUS | Status: DC
  Administered 2021-02-04 (×2): 5 [IU] via SUBCUTANEOUS
  Filled 2021-02-04 (×3): qty 0.05

## 2021-02-04 MED ORDER — SODIUM CHLORIDE 0.9 % IV SOLN
500.0000 mg | INTRAVENOUS | Status: AC
  Administered 2021-02-04 – 2021-02-08 (×5): 500 mg via INTRAVENOUS
  Filled 2021-02-04 (×5): qty 500

## 2021-02-04 MED ORDER — FUROSEMIDE 10 MG/ML IJ SOLN
20.0000 mg | Freq: Once | INTRAMUSCULAR | Status: AC
  Administered 2021-02-04: 20 mg via INTRAVENOUS
  Filled 2021-02-04: qty 2

## 2021-02-04 MED ORDER — ATORVASTATIN CALCIUM 80 MG PO TABS
80.0000 mg | ORAL_TABLET | Freq: Every day | ORAL | Status: DC
  Administered 2021-02-05 – 2021-02-12 (×7): 80 mg via ORAL
  Filled 2021-02-04 (×7): qty 1

## 2021-02-04 MED ORDER — PANTOPRAZOLE SODIUM 40 MG PO TBEC
40.0000 mg | DELAYED_RELEASE_TABLET | Freq: Every day | ORAL | Status: DC
  Administered 2021-02-05 – 2021-02-13 (×9): 40 mg via ORAL
  Filled 2021-02-04 (×9): qty 1

## 2021-02-04 MED ORDER — INSULIN ASPART 100 UNIT/ML IJ SOLN
0.0000 [IU] | Freq: Three times a day (TID) | INTRAMUSCULAR | Status: DC
  Administered 2021-02-04 – 2021-02-05 (×4): 3 [IU] via SUBCUTANEOUS
  Administered 2021-02-05: 5 [IU] via SUBCUTANEOUS

## 2021-02-04 MED ORDER — SODIUM CHLORIDE 0.9 % IV SOLN
1.0000 g | INTRAVENOUS | Status: DC
  Administered 2021-02-04 – 2021-02-07 (×4): 1 g via INTRAVENOUS
  Filled 2021-02-04 (×4): qty 10

## 2021-02-04 MED ORDER — METOPROLOL SUCCINATE ER 25 MG PO TB24
25.0000 mg | ORAL_TABLET | Freq: Every day | ORAL | Status: DC
  Administered 2021-02-05 – 2021-02-13 (×9): 25 mg via ORAL
  Filled 2021-02-04 (×10): qty 1

## 2021-02-04 MED ORDER — ASPIRIN EC 81 MG PO TBEC
81.0000 mg | DELAYED_RELEASE_TABLET | Freq: Every day | ORAL | Status: DC
  Administered 2021-02-05 – 2021-02-13 (×9): 81 mg via ORAL
  Filled 2021-02-04 (×10): qty 1

## 2021-02-04 MED ORDER — HEPARIN SODIUM (PORCINE) 5000 UNIT/ML IJ SOLN
5000.0000 [IU] | Freq: Three times a day (TID) | INTRAMUSCULAR | Status: DC
  Administered 2021-02-04 – 2021-02-08 (×12): 5000 [IU] via SUBCUTANEOUS
  Filled 2021-02-04 (×12): qty 1

## 2021-02-04 MED ORDER — FLUTICASONE PROPIONATE 50 MCG/ACT NA SUSP
1.0000 | Freq: Every day | NASAL | Status: DC
  Administered 2021-02-05 – 2021-02-13 (×7): 1 via NASAL
  Filled 2021-02-04 (×2): qty 16

## 2021-02-04 NOTE — ED Notes (Signed)
Breakfast Ordered 

## 2021-02-04 NOTE — Progress Notes (Signed)
  Echocardiogram 2D Echocardiogram has been performed.  Courtney [name] F 02/04/2021, 11:24 AM

## 2021-02-04 NOTE — Progress Notes (Signed)
PHARMACY - PHYSICIAN COMMUNICATION CRITICAL VALUE ALERT - BLOOD CULTURE IDENTIFICATION (BCID)  Courtney [name] is an 75 y.o. female who presented to [facility] on 02/03/2021 with a chief complaint of COPD exacerbation  Assessment:  WBC 13.7, last temp 98.1, no PNA on CXR  Name of physician (or Provider) Contacted: Dr. Rachael Darby  Current antibiotics: Ceftriaxone/Azithromycin   Changes to prescribed antibiotics recommended:  Likely contaminant, no changes needed  Results for orders placed or performed during the hospital encounter of 02/03/21  Blood Culture ID Panel (Reflexed) (Collected: 02/03/2021 10:24 PM)  Result Value Ref Range   Enterococcus faecalis NOT DETECTED NOT DETECTED   Enterococcus Faecium NOT DETECTED NOT DETECTED   Listeria monocytogenes NOT DETECTED NOT DETECTED   Staphylococcus species DETECTED (A) NOT DETECTED   Staphylococcus aureus (BCID) NOT DETECTED NOT DETECTED   Staphylococcus epidermidis NOT DETECTED NOT DETECTED   Staphylococcus lugdunensis NOT DETECTED NOT DETECTED   Streptococcus species NOT DETECTED NOT DETECTED   Streptococcus agalactiae NOT DETECTED NOT DETECTED   Streptococcus pneumoniae NOT DETECTED NOT DETECTED   Streptococcus pyogenes NOT DETECTED NOT DETECTED   A.calcoaceticus-baumannii NOT DETECTED NOT DETECTED   Bacteroides fragilis NOT DETECTED NOT DETECTED   Enterobacterales NOT DETECTED NOT DETECTED   Enterobacter cloacae complex NOT DETECTED NOT DETECTED   Escherichia coli NOT DETECTED NOT DETECTED   Klebsiella aerogenes NOT DETECTED NOT DETECTED   Klebsiella oxytoca NOT DETECTED NOT DETECTED   Klebsiella pneumoniae NOT DETECTED NOT DETECTED   Proteus species NOT DETECTED NOT DETECTED   Salmonella species NOT DETECTED NOT DETECTED   Serratia marcescens NOT DETECTED NOT DETECTED   Haemophilus influenzae NOT DETECTED NOT DETECTED   Neisseria meningitidis NOT DETECTED NOT DETECTED   Pseudomonas aeruginosa NOT DETECTED NOT DETECTED    Stenotrophomonas maltophilia NOT DETECTED NOT DETECTED   Candida albicans NOT DETECTED NOT DETECTED   Candida auris NOT DETECTED NOT DETECTED   Candida glabrata NOT DETECTED NOT DETECTED   Candida krusei NOT DETECTED NOT DETECTED   Candida parapsilosis NOT DETECTED NOT DETECTED   Candida tropicalis NOT DETECTED NOT DETECTED   Cryptococcus neoformans/gattii NOT DETECTED NOT DETECTED    Abran Duke 02/04/2021  9:49 PM

## 2021-02-04 NOTE — Progress Notes (Signed)
PROGRESS NOTE    Courtney [name]  JKK:[id number] DOB: [birth year] DOA: 02/03/2021 PCP: Jordan Hawks, PA-C    Chief Complaint  Patient presents with   Altered Mental Status   Shortness of Breath    Brief Narrative:   HPI: Courtney [name] is a 75 y.o. female with medical history significant for COPD, type 2 diabetes mellitus, hypertension, hyperlipidemia, chronic diastolic heart failure, who is admitted to [facility] on 02/03/2021 with acute hypoxic respiratory distress after presenting from snf to [facility] ED for evaluation of increased somnolence.  ED work-up significant for temperature of 99.3, oxygen saturation 88% on room air, chest x-ray significant for vascular congestion, she is with diffuse wheezing and diminished air entry, admitted for further management.  Assessment & Plan:   Principal Problem:   Acute respiratory failure with hypoxia (HCC) Active Problems:   Hypertension   Acute exacerbation of chronic obstructive pulmonary disease (COPD) (HCC)   Acute on chronic diastolic CHF (congestive heart failure) (HCC)   Severe sepsis (HCC)   CAP (community acquired pneumonia)   Acute metabolic encephalopathy   DM2 (diabetes mellitus, type 2) (HCC)    Acute hypoxic respiratory failure due to COPD exacerbation -Patient's with significantly diminished air entry bilaterally, and significant wheezing, she is with known history of COPD, continue with IV steroids. -We will continue with IV Rocephin and azithromycin in the setting of productive cough, procalcitonin is reassuring, no evidence of pneumonia.    Acute on chronic diastolic heart failure: - In the context of a documented history of chronic diastolic heart failure, with most recent echocardiogram from June 2021 notable for grade 2 diastolic dysfunction with LVEF 60 to 65%, -P is elevated at 725, she received IV Lasix on admission, will continue on as needed, will give another dose today.      Acute metabolic  encephalopathy:  -Due to above, but she is more awake and alert today but remains somnolent . -TSH is low at 2, but this is likely sick euthyroid syndrome, will check free T4 to confirm.     Type 2 diabetes mellitus:  - Documented history of such, with most recent hemoglobin A1c noted to be 7.8% when checked in June 2021.  On basal insulin in the form of 12 units subcu nightly as well as sliding scale Humalog 3 times daily with meals as an outpatient.  Presenting blood sugar per presenting CMP noted to be 138, with anticipation of ensuing relative hyperglycemia in the setting of plan for additional systemic corticosteroids as component of management of presenting acute COPD exacerbation.    Essential hypertension:  -home medications iNorvasc and Toprol-XL.  Will resume when more stable    DVT prophylaxis: Heparin Code Status: DNR Family Communication: none at bedside Disposition:   Status is: Inpatient  Remains inpatient appropriate because:IV treatments appropriate due to intensity of illness or inability to take PO  Dispo: The patient is from: Home              Anticipated d/c is to: SNF              Patient currently is not medically stable to d/c.   Difficult to place patient No       Consultants:  None  Subjective:  She reports some dyspnea, cough, unable to specify any more complaints.  Objective: Vitals:   02/04/21 0630 02/04/21 0841 02/04/21 0841 02/04/21 0845  BP: 136/79   (!) 148/81  Pulse: 98   99  Resp:  15   17  Temp:  98.3 F (36.8 C)    TempSrc:  Oral    SpO2: 100%  100% 99%  Weight:      Height:        Intake/Output Summary (Last 24 hours) at 02/04/2021 1230 Last data filed at 02/04/2021 6433 Gross per 24 hour  Intake 50 ml  Output 10 ml  Net 40 ml   Filed Weights   02/03/21 2240  Weight: 107.5 kg    Examination:  Sleeping, but  will wake up and answer answer questions and go back to sleep Symmetrical Chest wall movement, Minister entry  bilaterally with diffuse wheezing RRR,No Gallops,Rubs or new Murmurs, No Parasternal Heave +ve B.Sounds, Abd Soft, No tenderness, No rebound - guarding or rigidity. No Cyanosis, Clubbing or edema, No new Rash or bruise      Data Reviewed: I have personally reviewed following labs and imaging studies  CBC: Recent Labs  Lab 02/03/21 2212 02/04/21 0400 02/04/21 0411  WBC 12.2* 13.7*  --   NEUTROABS 9.9* 12.7*  --   HGB 14.6 14.8 16.3*  HCT 51.4* 55.2* 48.0*  MCV 89.5 94.0  --   PLT 197 134*  --     Basic Metabolic Panel: Recent Labs  Lab 02/03/21 2212 02/04/21 0400 02/04/21 0411 02/04/21 1020  NA 146* 146* 147*  --   K 5.1 5.6* 5.2*  --   CL 109 106  --   --   CO2 29 26  --   --   GLUCOSE 138* 174*  --   --   BUN 35* 37*  --   --   CREATININE 0.88 0.92  --   --   CALCIUM 10.3 10.1  --   --   MG  --  2.3  --  2.3  PHOS  --  3.1  --   --     GFR: Estimated Creatinine Clearance: 60.8 mL/min (by C-G formula based on SCr of 0.92 mg/dL).  Liver Function Tests: Recent Labs  Lab 02/03/21 2212 02/04/21 0400  AST 31 33  ALT 58* 59*  ALKPHOS 142* 138*  BILITOT 0.4 0.6  PROT 7.2 7.4  ALBUMIN 3.1* 3.1*    CBG: Recent Labs  Lab 02/04/21 0351 02/04/21 0850  GLUCAP 192* 217*     Recent Results (from the past 240 hour(s))  Blood Culture (routine x 2)     Status: None (Preliminary result)   Collection Time: 02/03/21 10:12 PM   Specimen: BLOOD RIGHT HAND  Result Value Ref Range Status   Specimen Description BLOOD RIGHT HAND  Final   Special Requests   Final    BOTTLES DRAWN AEROBIC AND ANAEROBIC Blood Culture adequate volume   Culture   Final    NO GROWTH < 24 HOURS Performed at [facility], [address] [place], [place] [address]    Report Status PENDING  Incomplete  Blood Culture (routine x 2)     Status: None (Preliminary result)   Collection Time: 02/03/21 10:24 PM   Specimen: BLOOD  Result Value Ref Range Status   Specimen Description BLOOD  BLOOD RIGHT FOREARM  Final   Special Requests   Final    BOTTLES DRAWN AEROBIC AND ANAEROBIC Blood Culture results may not be optimal due to an inadequate volume of blood received in culture bottles   Culture   Final    NO GROWTH < 12 HOURS Performed at [facility], [address] [place], [place] [address]    Report Status PENDING  Incomplete  Resp Panel by RT-PCR (Flu A&B, Covid) Nasopharyngeal Swab     Status: None   Collection Time: 02/03/21 10:24 PM   Specimen: Nasopharyngeal Swab; Nasopharyngeal(NP) swabs in vial transport medium  Result Value Ref Range Status   SARS Coronavirus 2 by RT PCR NEGATIVE NEGATIVE Final    Comment: (NOTE) SARS-CoV-2 target nucleic acids are NOT DETECTED.  The SARS-CoV-2 RNA is generally detectable in upper respiratory specimens during the acute phase of infection. The lowest concentration of SARS-CoV-2 viral copies this assay can detect is 138 copies/mL. A negative result does not preclude SARS-Cov-2 infection and should not be used as the sole basis for treatment or other patient management decisions. A negative result may occur with  improper specimen collection/handling, submission of specimen other than nasopharyngeal swab, presence of viral mutation(s) within the areas targeted by this assay, and inadequate number of viral copies(<138 copies/mL). A negative result must be combined with clinical observations, patient history, and epidemiological information. The expected result is Negative.  Fact Sheet for Patients:  BloggerCourse.com  Fact Sheet for Healthcare Providers:  SeriousBroker.it  This test is no t yet approved or cleared by the [place] FDA and  has been authorized for detection and/or diagnosis of SARS-CoV-2 by FDA under an Emergency Use Authorization (EUA). This EUA will remain  in effect (meaning this test can be used) for the duration of the COVID-19 declaration  under Section 564(b)(1) of the Act, 21 U.S.C.section 360bbb-3(b)(1), unless the authorization is terminated  or revoked sooner.       Influenza A by PCR NEGATIVE NEGATIVE Final   Influenza B by PCR NEGATIVE NEGATIVE Final    Comment: (NOTE) The Xpert Xpress SARS-CoV-2/FLU/RSV plus assay is intended as an aid in the diagnosis of influenza from Nasopharyngeal swab specimens and should not be used as a sole basis for treatment. Nasal washings and aspirates are unacceptable for Xpert Xpress SARS-CoV-2/FLU/RSV testing.  Fact Sheet for Patients: BloggerCourse.com  Fact Sheet for Healthcare Providers: SeriousBroker.it  This test is not yet approved or cleared by the [place] FDA and has been authorized for detection and/or diagnosis of SARS-CoV-2 by FDA under an Emergency Use Authorization (EUA). This EUA will remain in effect (meaning this test can be used) for the duration of the COVID-19 declaration under Section 564(b)(1) of the Act, 21 U.S.C. section 360bbb-3(b)(1), unless the authorization is terminated or revoked.  Performed at [facility], [address] [place], [place] [address]          Radiology Studies: DG Chest Port 1 View  Result Date: 02/03/2021 CLINICAL DATA:  Questionable sepsis. EXAM: PORTABLE CHEST 1 VIEW COMPARISON:  Chest x-ray 10/19/2019. FINDINGS: Heart is enlarged, unchanged. There is central pulmonary vascular congestion which has increased from prior. Interstitial opacity are present in the lung bases. There is a band of atelectasis in the left mid lung. There is no pleural effusion or pneumothorax identified. No acute fractures. IMPRESSION: 1. Cardiomegaly with central pulmonary vascular congestion/mild interstitial edema. Electronically Signed   By: Darliss Cheney M.D.   On: 02/03/2021 22:27   ECHOCARDIOGRAM COMPLETE  Result Date: 02/04/2021    ECHOCARDIOGRAM REPORT   Patient Name:    Courtney [name] Date of Exam: 02/04/2021 Medical Rec #:  [id number]       Height:       63.5 in Accession #:    [id number]      Weight:  237.0 lb Date of Birth:  [birth year]        BSA:          2.090 m Patient Age:    54 years        BP:           148/81 mmHg Patient Gender: F               HR:           96 bpm. Exam Location:  Inpatient Procedure: 2D Echo, Cardiac Doppler and Color Doppler Indications:    CHF-Acute Diastolic I50.31  History:        Patient has prior history of Echocardiogram examinations, most                 recent 10/18/2019. CHF, Previous Myocardial Infarction and CAD,                 COPD; Risk Factors:Current Smoker, Dyslipidemia, Diabetes,                 Hypertension and Morbid obesity.  Sonographer:    Roosvelt Maser RDCS Referring Phys: [id number] Angie Fava IMPRESSIONS  1. Left ventricular ejection fraction, by estimation, is 60 to 65%. The left ventricle has normal function. The left ventricle has no regional wall motion abnormalities. There is moderate left ventricular hypertrophy. Left ventricular diastolic parameters are indeterminate.  2. Right ventricular systolic function is normal. The right ventricular size is moderately enlarged. There is moderately elevated pulmonary artery systolic pressure. The estimated right ventricular systolic pressure is 55.4 mmHg.  3. Left atrial size was severely dilated.  4. The mitral valve is normal in structure. No evidence of mitral valve regurgitation. No evidence of mitral stenosis.  5. Tricuspid valve regurgitation is moderate.  6. The aortic valve is normal in structure. Aortic valve regurgitation is not visualized. No aortic stenosis is present.  7. The inferior vena cava is normal in size with greater than 50% respiratory variability, suggesting right atrial pressure of 3 mmHg. Comparison(s): Prior images reviewed side by side. FINDINGS  Left Ventricle: Left ventricular ejection fraction, by estimation, is 60 to 65%. The left ventricle has  normal function. The left ventricle has no regional wall motion abnormalities. The left ventricular internal cavity size was normal in size. There is  moderate left ventricular hypertrophy. Left ventricular diastolic parameters are indeterminate. Right Ventricle: The right ventricular size is moderately enlarged. No increase in right ventricular wall thickness. Right ventricular systolic function is normal. There is moderately elevated pulmonary artery systolic pressure. The tricuspid regurgitant  velocity is 3.62 m/s, and with an assumed right atrial pressure of 3 mmHg, the estimated right ventricular systolic pressure is 55.4 mmHg. Left Atrium: Left atrial size was severely dilated. Right Atrium: Right atrial size was normal in size. Pericardium: There is no evidence of pericardial effusion. Mitral Valve: The mitral valve is normal in structure. No evidence of mitral valve regurgitation. No evidence of mitral valve stenosis. Tricuspid Valve: The tricuspid valve is normal in structure. Tricuspid valve regurgitation is moderate . No evidence of tricuspid stenosis. Aortic Valve: The aortic valve is normal in structure. Aortic valve regurgitation is not visualized. No aortic stenosis is present. Aortic valve mean gradient measures 6.5 mmHg. Aortic valve peak gradient measures 11.7 mmHg. Pulmonic Valve: The pulmonic valve was normal in structure. Pulmonic valve regurgitation is mild. No evidence of pulmonic stenosis. Aorta: The aortic root is normal in size and structure. Venous: The inferior vena cava  is normal in size with greater than 50% respiratory variability, suggesting right atrial pressure of 3 mmHg. IAS/Shunts: No atrial level shunt detected by color flow Doppler.  LEFT VENTRICLE PLAX 2D LVIDd:         3.80 cm LVIDs:         2.80 cm LV PW:         1.20 cm LV IVS:        1.50 cm LVOT diam:     2.10 cm LVOT Area:     3.46 cm  RIGHT VENTRICLE RV Basal diam:  4.70 cm RV Mid diam:    4.50 cm LEFT ATRIUM            Index        RIGHT ATRIUM           Index LA diam:      3.30 cm 1.58 cm/m   RA Area:     31.90 cm LA Vol (A2C): 32.0 ml 15.31 ml/m  RA Volume:   138.00 ml 66.01 ml/m LA Vol (A4C): 54.2 ml 25.93 ml/m  AORTIC VALVE AV Vmax:      171.00 cm/s AV Vmean:     118.000 cm/s AV VTI:       0.290 m AV Peak Grad: 11.7 mmHg AV Mean Grad: 6.5 mmHg  AORTA Ao Root diam: 2.70 cm Ao Asc diam:  3.10 cm TRICUSPID VALVE TR Peak grad:   52.4 mmHg TR Vmax:        362.00 cm/s  SHUNTS Systemic Diam: 2.10 cm Donato Schultz MD Electronically signed by Donato Schultz MD Signature Date/Time: 02/04/2021/11:56:40 AM    Final         Scheduled Meds:  aspirin EC  81 mg Oral Daily   atorvastatin  80 mg Oral QHS   fluticasone  1 spray Each Nare Daily   insulin aspart  0-9 Units Subcutaneous TID WC   insulin glargine-yfgn  5 Units Subcutaneous QHS   ipratropium-albuterol  3 mL Nebulization Q6H   methylPREDNISolone (SOLU-MEDROL) injection  80 mg Intravenous BID   metoprolol succinate  25 mg Oral Daily   Continuous Infusions:  azithromycin     cefTRIAXone (ROCEPHIN)  IV       LOS: 1 day      Huey Bienenstock, MD [facility]   To contact the attending provider between 7A-7P or the covering provider during after hours 7P-7A, please log into the web site [email or web address] and access using universal [place] password for that web site. If you do not have the password, please call the hospital operator.  02/04/2021, 12:30 PM

## 2021-02-04 NOTE — Evaluation (Signed)
Clinical/Bedside Swallow Evaluation Patient Details  Name: Courtney [name] MRN: [id number] Date of Birth: 11/12/1945  Today's Date: 02/04/2021 Time: SLP Start Time (ACUTE ONLY): 1300 SLP Stop Time (ACUTE ONLY): 1315 SLP Time Calculation (min) (ACUTE ONLY): 15 min  Past Medical History:  Past Medical History:  Diagnosis Date   Anemia    Arthritis    Asthma    CAD (coronary artery disease)    COPD (chronic obstructive pulmonary disease) (HCC)    Diabetes mellitus    Hyperlipidemia    Hypertension    Myocardial infarct (HCC)    Postmenopausal atrophic vaginitis    Restless leg syndrome    Past Surgical History:  Past Surgical History:  Procedure Laterality Date   CHOLECYSTECTOMY  1970's   INCISION AND DRAINAGE ABSCESS Right 06/27/2018   Procedure: 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.;  Surgeon: Romie Levee, MD;  Location: [facility];  Service: General;  Laterality: Right;   INCISION AND DRAINAGE PERIRECTAL ABSCESS N/A 06/25/2018   Procedure: IRRIGATION AND DEBRIDEMENT PERIRECTAL ABCESS;  Surgeon: Romie Levee, MD;  Location: [facility];  Service: General;  Laterality: N/A;   IRRIGATION AND DEBRIDEMENT ABSCESS N/A 07/01/2018   Procedure: IRRIGATION AND DEBRIDEMENT ABSCESS;  Surgeon: Emelia Loron, MD;  Location: [facility];  Service: General;  Laterality: N/A;   OTHER SURGICAL HISTORY  1970's   hysterectomy   WOUND DEBRIDEMENT N/A 07/03/2018   Procedure: DEBRIDEMENT PELVIS WITH DRESSING CHANGE;  Surgeon: Emelia Loron, MD;  Location: [facility];  Service: General;  Laterality: N/A;   HPI:  Courtney [name] is a 75 y.o. female with medical history significant for COPD, type 2 diabetes mellitus, hypertension, hyperlipidemia, chronic diastolic heart failure, who is admitted to [facility] on 02/03/2021 with acute hypoxic respiratory distress after presenting from snf to [facility] ED for evaluation of increased somnolence.  ED work-up significant for temperature of 99.3,  oxygen saturation 88% on room air, chest x-ray significant for vascular congestion, she is with diffuse wheezing and diminished air entry, admitted for further management.Pt coughing after [facility].    Assessment / Plan / Recommendation  Clinical Impression  Pt demonstrates cognitive impiarment leading to oral holding of fully masticated solids.    SLP had to extract chewed up angel food cake from pts mouth. Otherwise, pt accpeted puree and sips of thin liquids without coughing or difficulty. She would only take very small sips, could not encourage sequential sips. Pt will respond Y/N to questions and partially states her name but otherwise does no verbalize, Unsure of communicative baseline. RN reports pt had coughing following most sips of thin with her. Given this concern and pts altered mental status, will downgrade pts diet to a very conservative easy texture for the weekend and f/u Monday for advanced trials. For now, pt to consume purees and nectar thick liquids. SLP Visit Diagnosis: Dysphagia, unspecified (R13.10)    Aspiration Risk  Mild aspiration risk;Risk for inadequate nutrition/hydration    Diet Recommendation Dysphagia 1 (Puree);Nectar-thick liquid   Liquid Administration via: Cup;Straw Medication Administration: Crushed with puree Supervision: Staff to assist with self feeding Compensations: Slow rate;Small sips/bites Postural Changes: Seated upright at 90 degrees    Other  Recommendations Oral Care Recommendations: Oral care BID    Recommendations for follow up therapy are one component of a multi-disciplinary discharge planning process, led by the attending physician.  Recommendations may be updated based on patient status, additional functional criteria and insurance authorization.  Follow up Recommendations Skilled Nursing facility  Frequency and Duration min 2x/week  2 weeks       Prognosis Prognosis for Safe Diet Advancement: Good      Swallow Study   General  HPI: Courtney [name] is a 75 y.o. female with medical history significant for COPD, type 2 diabetes mellitus, hypertension, hyperlipidemia, chronic diastolic heart failure, who is admitted to [facility] on 02/03/2021 with acute hypoxic respiratory distress after presenting from snf to [facility] ED for evaluation of increased somnolence.  ED work-up significant for temperature of 99.3, oxygen saturation 88% on room air, chest x-ray significant for vascular congestion, she is with diffuse wheezing and diminished air entry, admitted for further management.Pt coughing after [facility]. Type of Study: Bedside Swallow Evaluation Previous Swallow Assessment: none Diet Prior to this Study: Regular;Thin liquids Temperature Spikes Noted: No Respiratory Status: Room air History of Recent Intubation: No Behavior/Cognition: Alert;Distractible;Requires cueing Oral Cavity Assessment: Within Functional Limits Oral Care Completed by SLP: No Oral Cavity - Dentition: Adequate natural dentition Vision: Functional for self-feeding Self-Feeding Abilities: Needs assist Patient Positioning: Upright in bed Baseline Vocal Quality: Normal Volitional Cough: Congested Volitional Swallow: Unable to elicit    Oral/Motor/Sensory Function Overall Oral Motor/Sensory Function: Within functional limits   Ice Chips Ice chips: Impaired Presentation: Spoon Oral Phase Functional Implications: Oral holding   Thin Liquid Thin Liquid: Within functional limits Presentation: Straw;Cup    Nectar Thick Nectar Thick Liquid: Not tested   Honey Thick Honey Thick Liquid: Not tested   Puree Puree: Within functional limits Presentation: Spoon   Solid     Solid: Impaired Presentation: Spoon Oral Phase Functional Implications: Oral holding;Prolonged oral transit      Dannie Hattabaugh, Riley Nearing 02/04/2021,1:21 PM

## 2021-02-05 ENCOUNTER — Inpatient Hospital Stay (HOSPITAL_COMMUNITY): Payer: Medicare (Managed Care)

## 2021-02-05 DIAGNOSIS — J441 Chronic obstructive pulmonary disease with (acute) exacerbation: Secondary | ICD-10-CM | POA: Diagnosis not present

## 2021-02-05 DIAGNOSIS — J9601 Acute respiratory failure with hypoxia: Secondary | ICD-10-CM | POA: Diagnosis not present

## 2021-02-05 LAB — BASIC METABOLIC PANEL
Anion gap: 10 (ref 5–15)
BUN: 38 mg/dL — ABNORMAL HIGH (ref 8–23)
CO2: 28 mmol/L (ref 22–32)
Calcium: 10.1 mg/dL (ref 8.9–10.3)
Chloride: 109 mmol/L (ref 98–111)
Creatinine, Ser: 0.77 mg/dL (ref 0.44–1.00)
GFR, Estimated: >60 mL/min (ref >60)
Glucose, Bld: 252 mg/dL — ABNORMAL HIGH (ref 70–99)
Potassium: 5.7 mmol/L — ABNORMAL HIGH (ref 3.5–5.1)
Sodium: 147 mmol/L — ABNORMAL HIGH (ref 135–145)

## 2021-02-05 LAB — GLUCOSE, CAPILLARY
Glucose-Capillary: 288 mg/dL — ABNORMAL HIGH (ref 70–99)
Glucose-Capillary: 270 mg/dL — ABNORMAL HIGH (ref 70–99)
Glucose-Capillary: 247 mg/dL — ABNORMAL HIGH (ref 70–99)
Glucose-Capillary: 402 mg/dL — ABNORMAL HIGH (ref 70–99)

## 2021-02-05 LAB — CBC
HCT: 48.1 % — ABNORMAL HIGH (ref 36.0–46.0)
Hemoglobin: 13.6 g/dL (ref 12.0–15.0)
MCH: 25 pg — ABNORMAL LOW (ref 26.0–34.0)
MCHC: 28.3 g/dL — ABNORMAL LOW (ref 30.0–36.0)
MCV: 88.6 fL (ref 80.0–100.0)
Platelets: 210 10*3/uL (ref 150–400)
RBC: 5.43 MIL/uL — ABNORMAL HIGH (ref 3.87–5.11)
RDW: 16.3 % — ABNORMAL HIGH (ref 11.5–15.5)
WBC: 12.3 10*3/uL — ABNORMAL HIGH (ref 4.0–10.5)
nRBC: 0.3 % — ABNORMAL HIGH (ref 0.0–0.2)

## 2021-02-05 LAB — POTASSIUM
Potassium: >7.5 mmol/L (ref 3.5–5.1)
Potassium: 5.6 mmol/L — ABNORMAL HIGH (ref 3.5–5.1)

## 2021-02-05 LAB — CALCIUM, IONIZED: Calcium, Ionized, Serum: 5.5 mg/dL (ref 4.5–5.6)

## 2021-02-05 LAB — T4, FREE: Free T4: 1.27 ng/dL — ABNORMAL HIGH (ref 0.61–1.12)

## 2021-02-05 LAB — BRAIN NATRIURETIC PEPTIDE: B Natriuretic Peptide: 417.3 pg/mL — ABNORMAL HIGH (ref 0.0–100.0)

## 2021-02-05 MED ORDER — SODIUM ZIRCONIUM CYCLOSILICATE 10 G PO PACK
10.0000 g | PACK | Freq: Once | ORAL | Status: AC
  Administered 2021-02-05: 10 g via ORAL
  Filled 2021-02-05: qty 1

## 2021-02-05 MED ORDER — INSULIN GLARGINE-YFGN 100 UNIT/ML ~~LOC~~ SOLN: 10.0000 [IU] | Freq: Every day | SUBCUTANEOUS | Status: DC

## 2021-02-05 MED ORDER — GUAIFENESIN ER 600 MG PO TB12
1200.0000 mg | ORAL_TABLET | Freq: Two times a day (BID) | ORAL | Status: DC
  Administered 2021-02-05 – 2021-02-07 (×5): 1200 mg via ORAL
  Filled 2021-02-05 (×6): qty 2

## 2021-02-05 MED ORDER — INSULIN ASPART 100 UNIT/ML IJ SOLN
0.0000 [IU] | Freq: Three times a day (TID) | INTRAMUSCULAR | Status: DC
  Administered 2021-02-06 (×2): 7 [IU] via SUBCUTANEOUS

## 2021-02-05 MED ORDER — INSULIN GLARGINE-YFGN 100 UNIT/ML ~~LOC~~ SOLN
5.0000 [IU] | Freq: Every day | SUBCUTANEOUS | Status: DC
  Filled 2021-02-05: qty 0.05

## 2021-02-05 MED ORDER — INSULIN GLARGINE-YFGN 100 UNIT/ML ~~LOC~~ SOLN
10.0000 [IU] | Freq: Every day | SUBCUTANEOUS | Status: DC
Start: 2021-02-05 — End: 2021-02-06
  Administered 2021-02-05: 10 [IU] via SUBCUTANEOUS
  Filled 2021-02-05 (×2): qty 0.1

## 2021-02-05 MED ORDER — INSULIN ASPART 100 UNIT/ML IJ SOLN
10.0000 [IU] | Freq: Once | INTRAMUSCULAR | Status: AC
  Administered 2021-02-05: 10 [IU] via SUBCUTANEOUS

## 2021-02-05 NOTE — Progress Notes (Addendum)
PROGRESS NOTE    Courtney [name]  CZY:[id number] DOB: [birth year] DOA: 02/03/2021 PCP: Jordan Hawks, PA-C    Chief Complaint  Patient presents with   Altered Mental Status   Shortness of Breath    Brief Narrative:   HPI: Courtney [name] is a 75 y.o. female with medical history significant for COPD, type 2 diabetes mellitus, hypertension, hyperlipidemia, chronic diastolic heart failure, who is admitted to [facility] on 02/03/2021 with acute hypoxic respiratory distress after presenting from snf to [facility] ED for evaluation of increased somnolence.  ED work-up significant for temperature of 99.3, oxygen saturation 88% on room air, chest x-ray significant for vascular congestion, she is with diffuse wheezing and diminished air entry, admitted for further management.  Assessment & Plan:   Principal Problem:   Acute respiratory failure with hypoxia (HCC) Active Problems:   Hypertension   Acute exacerbation of chronic obstructive pulmonary disease (COPD) (HCC)   Acute on chronic diastolic CHF (congestive heart failure) (HCC)   Severe sepsis (HCC)   CAP (community acquired pneumonia)   Acute metabolic encephalopathy   DM2 (diabetes mellitus, type 2) (HCC)    Acute hypoxic respiratory failure due to COPD exacerbation -Patient's with significantly diminished air entry bilaterally, and significant wheezing, she is with known history of COPD, continue with IV steroids. -We will continue with IV Rocephin and azithromycin in the setting of productive cough, procalcitonin is reassuring, no evidence of pneumonia. -Patient remains with significant upper airway congestion, she was encouraged with incentive spirometry and flutter valve, she will be started on Mucinex as well.    Acute on chronic diastolic heart failure: - In the context of a documented history of chronic diastolic heart failure, with most recent echocardiogram from June 2021 notable for grade 2 diastolic dysfunction with LVEF  60 to 65%, -P is elevated at 725, she received IV Lasix on admission, repeat chest x-ray valuate if still having some persistent vascular congestion.   -Potassium is 5.7, but hemolyzed, will recheck. Addendum 4:40 PM: repeat potassium was 7.5, sample was hemolyzed again, certainly inaccurate, I will repeat labs again, she already received Lokelma this a.m.     Acute metabolic encephalopathy:  -Due to above, but she is more awake and alert today but remains somnolent . -TSH is low at 2, but this is likely sick euthyroid syndrome, free T4 mildly elevated at 1.8.   Type 2 diabetes mellitus:  - Documented history of such, with most recent hemoglobin A1c noted to be 7.8% when checked in June 2021.  -  On basal insulin in the form of 12 units subcu nightly as well as sliding scale Humalog 3 times daily with meals as an outpatient.  -CBG uncontrolled, started on Lantus.    Essential hypertension:  -home medications iNorvasc and Toprol-XL.  Will resume when more stable    DVT prophylaxis: Heparin Code Status: DNR Family Communication: none at bedside Disposition:   Status is: Inpatient  Remains inpatient appropriate because:IV treatments appropriate due to intensity of illness or inability to take PO  Dispo: The patient is from: Home              Anticipated d/c is to: SNF              Patient currently is not medically stable to d/c.   Difficult to place patient No       Consultants:  None  Subjective: Reports some cough and upper airway congestion. Objective: Vitals:   02/04/21  1900 02/05/21 0000 02/05/21 0505 02/05/21 0834  BP: (!) 157/80 140/73 (!) 154/81 135/65  Pulse: 96 94 (!) 102 100  Resp: 19 15 14 19   Temp: 98.6 F (37 C) 98 F (36.7 C) 97.6 F (36.4 C) 97.6 F (36.4 C)  TempSrc: Oral Oral Oral Oral  SpO2: 100% 99% 97% 100%  Weight:      Height:        Intake/Output Summary (Last 24 hours) at 02/05/2021 1054 Last data filed at 02/05/2021 0836 Gross per 24  hour  Intake --  Output 500 ml  Net -500 ml   Filed Weights   02/03/21 2240 02/04/21 1429  Weight: 107.5 kg 102.7 kg    Examination:  She is sleeping, but wakes up and answer questions appropriately, in no apparent distress.   Symmetrical Chest wall movement, improved air entry today, but she remains with significant wheezing, and upper airway congestion RRR,No Gallops,Rubs or new Murmurs, No Parasternal Heave +ve B.Sounds, Abd Soft, No tenderness, No rebound - guarding or rigidity. No Cyanosis, Clubbing or edema, No new Rash or bruise     Data Reviewed: I have personally reviewed following labs and imaging studies  CBC: Recent Labs  Lab 02/03/21 2212 02/04/21 0400 02/04/21 0411 02/05/21 0207  WBC 12.2* 13.7*  --  12.3*  NEUTROABS 9.9* 12.7*  --   --   HGB 14.6 14.8 16.3* 13.6  HCT 51.4* 55.2* 48.0* 48.1*  MCV 89.5 94.0  --  88.6  PLT 197 134*  --  210    Basic Metabolic Panel: Recent Labs  Lab 02/03/21 2212 02/04/21 0400 02/04/21 0411 02/04/21 1020 02/05/21 0207  NA 146* 146* 147*  --  147*  K 5.1 5.6* 5.2*  --  5.7*  CL 109 106  --   --  109  CO2 29 26  --   --  28  GLUCOSE 138* 174*  --   --  252*  BUN 35* 37*  --   --  38*  CREATININE 0.88 0.92  --   --  0.77  CALCIUM 10.3 10.1  --   --  10.1  MG  --  2.3  --  2.3  --   PHOS  --  3.1  --   --   --     GFR: Estimated Creatinine Clearance: 68.1 mL/min (by C-G formula based on SCr of 0.77 mg/dL).  Liver Function Tests: Recent Labs  Lab 02/03/21 2212 02/04/21 0400  AST 31 33  ALT 58* 59*  ALKPHOS 142* 138*  BILITOT 0.4 0.6  PROT 7.2 7.4  ALBUMIN 3.1* 3.1*    CBG: Recent Labs  Lab 02/04/21 0351 02/04/21 0850 02/04/21 1250 02/04/21 1848 02/04/21 2058  GLUCAP 192* 217* 226* 232* 229*     Recent Results (from the past 240 hour(s))  Blood Culture (routine x 2)     Status: None (Preliminary result)   Collection Time: 02/03/21 10:12 PM   Specimen: BLOOD RIGHT HAND  Result Value Ref  Range Status   Specimen Description BLOOD RIGHT HAND  Final   Special Requests   Final    BOTTLES DRAWN AEROBIC AND ANAEROBIC Blood Culture adequate volume   Culture   Final    NO GROWTH < 24 HOURS Performed at [facility] Lab, [address] [place], [place]    Report Status PENDING  Incomplete  Blood Culture (routine x 2)     Status: Abnormal (Preliminary result)   Collection Time:  02/03/21 10:24 PM   Specimen: BLOOD  Result Value Ref Range Status   Specimen Description BLOOD BLOOD RIGHT FOREARM  Final   Special Requests   Final    BOTTLES DRAWN AEROBIC AND ANAEROBIC Blood Culture results may not be optimal due to an inadequate volume of blood received in culture bottles   Culture  Setup Time   Final    GRAM POSITIVE COCCI IN CLUSTERS ANAEROBIC BOTTLE ONLY CRITICAL RESULT CALLED TO, READ BACK BY AND VERIFIED WITH: PHARMD JAMES LEDFORD 02/04/21@21 :46 BY TW    Culture (A)  Final    STAPHYLOCOCCUS HOMINIS THE SIGNIFICANCE OF ISOLATING THIS ORGANISM FROM A SINGLE SET OF BLOOD CULTURES WHEN MULTIPLE SETS ARE DRAWN IS UNCERTAIN. PLEASE NOTIFY THE MICROBIOLOGY DEPARTMENT WITHIN ONE WEEK IF SPECIATION AND SENSITIVITIES ARE REQUIRED. Performed at [facility] Lab, [address] [place], [place] [address]    Report Status PENDING  Incomplete  Resp Panel by RT-PCR (Flu A&B, Covid) Nasopharyngeal Swab     Status: None   Collection Time: 02/03/21 10:24 PM   Specimen: Nasopharyngeal Swab; Nasopharyngeal(NP) swabs in vial transport medium  Result Value Ref Range Status   SARS Coronavirus 2 by RT PCR NEGATIVE NEGATIVE Final    Comment: (NOTE) SARS-CoV-2 target nucleic acids are NOT DETECTED.  The SARS-CoV-2 RNA is generally detectable in upper respiratory specimens during the acute phase of infection. The lowest concentration of SARS-CoV-2 viral copies this assay can detect is 138 copies/mL. A negative result does not preclude SARS-Cov-2 infection and should not be used as  the sole basis for treatment or other patient management decisions. A negative result may occur with  improper specimen collection/handling, submission of specimen other than nasopharyngeal swab, presence of viral mutation(s) within the areas targeted by this assay, and inadequate number of viral copies(<138 copies/mL). A negative result must be combined with clinical observations, patient history, and epidemiological information. The expected result is Negative.  Fact Sheet for Patients:  BloggerCourse.com  Fact Sheet for Healthcare Providers:  SeriousBroker.it  This test is no t yet approved or cleared by the [place] FDA and  has been authorized for detection and/or diagnosis of SARS-CoV-2 by FDA under an Emergency Use Authorization (EUA). This EUA will remain  in effect (meaning this test can be used) for the duration of the COVID-19 declaration under Section 564(b)(1) of the Act, 21 U.S.C.section 360bbb-3(b)(1), unless the authorization is terminated  or revoked sooner.       Influenza A by PCR NEGATIVE NEGATIVE Final   Influenza B by PCR NEGATIVE NEGATIVE Final    Comment: (NOTE) The Xpert Xpress SARS-CoV-2/FLU/RSV plus assay is intended as an aid in the diagnosis of influenza from Nasopharyngeal swab specimens and should not be used as a sole basis for treatment. Nasal washings and aspirates are unacceptable for Xpert Xpress SARS-CoV-2/FLU/RSV testing.  Fact Sheet for Patients: BloggerCourse.com  Fact Sheet for Healthcare Providers: SeriousBroker.it  This test is not yet approved or cleared by the [place] FDA and has been authorized for detection and/or diagnosis of SARS-CoV-2 by FDA under an Emergency Use Authorization (EUA). This EUA will remain in effect (meaning this test can be used) for the duration of the COVID-19 declaration under Section 564(b)(1) of  the Act, 21 U.S.C. section 360bbb-3(b)(1), unless the authorization is terminated or revoked.  Performed at [facility] Lab, [address], [place], [place] [address]   Blood Culture ID Panel (Reflexed)     Status: Abnormal   Collection  Time: 02/03/21 10:24 PM  Result Value Ref Range Status   Enterococcus faecalis NOT DETECTED NOT DETECTED Final   Enterococcus Faecium NOT DETECTED NOT DETECTED Final   Listeria monocytogenes NOT DETECTED NOT DETECTED Final   Staphylococcus species DETECTED (A) NOT DETECTED Final    Comment: CRITICAL RESULT CALLED TO, READ BACK BY AND VERIFIED WITH: PHARMD JAMES LEDFORD 02/04/21@21 :46 BY TW    Staphylococcus aureus (BCID) NOT DETECTED NOT DETECTED Final   Staphylococcus epidermidis NOT DETECTED NOT DETECTED Final   Staphylococcus lugdunensis NOT DETECTED NOT DETECTED Final   Streptococcus species NOT DETECTED NOT DETECTED Final   Streptococcus agalactiae NOT DETECTED NOT DETECTED Final   Streptococcus pneumoniae NOT DETECTED NOT DETECTED Final   Streptococcus pyogenes NOT DETECTED NOT DETECTED Final   A.calcoaceticus-baumannii NOT DETECTED NOT DETECTED Final   Bacteroides fragilis NOT DETECTED NOT DETECTED Final   Enterobacterales NOT DETECTED NOT DETECTED Final   Enterobacter cloacae complex NOT DETECTED NOT DETECTED Final   Escherichia coli NOT DETECTED NOT DETECTED Final   Klebsiella aerogenes NOT DETECTED NOT DETECTED Final   Klebsiella oxytoca NOT DETECTED NOT DETECTED Final   Klebsiella pneumoniae NOT DETECTED NOT DETECTED Final   Proteus species NOT DETECTED NOT DETECTED Final   Salmonella species NOT DETECTED NOT DETECTED Final   Serratia marcescens NOT DETECTED NOT DETECTED Final   Haemophilus influenzae NOT DETECTED NOT DETECTED Final   Neisseria meningitidis NOT DETECTED NOT DETECTED Final   Pseudomonas aeruginosa NOT DETECTED NOT DETECTED Final   Stenotrophomonas maltophilia NOT DETECTED NOT DETECTED Final   Candida albicans NOT  DETECTED NOT DETECTED Final   Candida auris NOT DETECTED NOT DETECTED Final   Candida glabrata NOT DETECTED NOT DETECTED Final   Candida krusei NOT DETECTED NOT DETECTED Final   Candida parapsilosis NOT DETECTED NOT DETECTED Final   Candida tropicalis NOT DETECTED NOT DETECTED Final   Cryptococcus neoformans/gattii NOT DETECTED NOT DETECTED Final    Comment: Performed at [facility] Lab, [address] [place], [place] [address]         Radiology Studies: DG Chest Port 1 View  Result Date: 02/03/2021 CLINICAL DATA:  Questionable sepsis. EXAM: PORTABLE CHEST 1 VIEW COMPARISON:  Chest x-ray 10/19/2019. FINDINGS: Heart is enlarged, unchanged. There is central pulmonary vascular congestion which has increased from prior. Interstitial opacity are present in the lung bases. There is a band of atelectasis in the left mid lung. There is no pleural effusion or pneumothorax identified. No acute fractures. IMPRESSION: 1. Cardiomegaly with central pulmonary vascular congestion/mild interstitial edema. Electronically Signed   By: Darliss Cheney M.D.   On: 02/03/2021 22:27   ECHOCARDIOGRAM COMPLETE  Result Date: 02/04/2021    ECHOCARDIOGRAM REPORT   Patient Name:   Courtney [name] Date of Exam: 02/04/2021 Medical Rec #:  [id number]       Height:       63.5 in Accession #:    [id number]      Weight:       237.0 lb Date of Birth:  12-27-1945        BSA:          2.090 m Patient Age:    75 years        BP:           148/81 mmHg Patient Gender: F               HR:           96 bpm. Exam Location:  Inpatient Procedure: 2D Echo, Cardiac Doppler and Color Doppler Indications:    CHF-Acute Diastolic I50.31  History:        Patient has prior history of Echocardiogram examinations, most                 recent 10/18/2019. CHF, Previous Myocardial Infarction and CAD,                 COPD; Risk Factors:Current Smoker, Dyslipidemia, Diabetes,                 Hypertension and Morbid obesity.  Sonographer:    Roosvelt Maser RDCS  Referring Phys: [id number] Angie Fava IMPRESSIONS  1. Left ventricular ejection fraction, by estimation, is 60 to 65%. The left ventricle has normal function. The left ventricle has no regional wall motion abnormalities. There is moderate left ventricular hypertrophy. Left ventricular diastolic parameters are indeterminate.  2. Right ventricular systolic function is normal. The right ventricular size is moderately enlarged. There is moderately elevated pulmonary artery systolic pressure. The estimated right ventricular systolic pressure is 55.4 mmHg.  3. Left atrial size was severely dilated.  4. The mitral valve is normal in structure. No evidence of mitral valve regurgitation. No evidence of mitral stenosis.  5. Tricuspid valve regurgitation is moderate.  6. The aortic valve is normal in structure. Aortic valve regurgitation is not visualized. No aortic stenosis is present.  7. The inferior vena cava is normal in size with greater than 50% respiratory variability, suggesting right atrial pressure of 3 mmHg. Comparison(s): Prior images reviewed side by side. FINDINGS  Left Ventricle: Left ventricular ejection fraction, by estimation, is 60 to 65%. The left ventricle has normal function. The left ventricle has no regional wall motion abnormalities. The left ventricular internal cavity size was normal in size. There is  moderate left ventricular hypertrophy. Left ventricular diastolic parameters are indeterminate. Right Ventricle: The right ventricular size is moderately enlarged. No increase in right ventricular wall thickness. Right ventricular systolic function is normal. There is moderately elevated pulmonary artery systolic pressure. The tricuspid regurgitant  velocity is 3.62 m/s, and with an assumed right atrial pressure of 3 mmHg, the estimated right ventricular systolic pressure is 55.4 mmHg. Left Atrium: Left atrial size was severely dilated. Right Atrium: Right atrial size was normal in size.  Pericardium: There is no evidence of pericardial effusion. Mitral Valve: The mitral valve is normal in structure. No evidence of mitral valve regurgitation. No evidence of mitral valve stenosis. Tricuspid Valve: The tricuspid valve is normal in structure. Tricuspid valve regurgitation is moderate . No evidence of tricuspid stenosis. Aortic Valve: The aortic valve is normal in structure. Aortic valve regurgitation is not visualized. No aortic stenosis is present. Aortic valve mean gradient measures 6.5 mmHg. Aortic valve peak gradient measures 11.7 mmHg. Pulmonic Valve: The pulmonic valve was normal in structure. Pulmonic valve regurgitation is mild. No evidence of pulmonic stenosis. Aorta: The aortic root is normal in size and structure. Venous: The inferior vena cava is normal in size with greater than 50% respiratory variability, suggesting right atrial pressure of 3 mmHg. IAS/Shunts: No atrial level shunt detected by color flow Doppler.  LEFT VENTRICLE PLAX 2D LVIDd:         3.80 cm LVIDs:         2.80 cm LV PW:         1.20 cm LV IVS:        1.50 cm LVOT diam:     2.10 cm  LVOT Area:     3.46 cm  RIGHT VENTRICLE RV Basal diam:  4.70 cm RV Mid diam:    4.50 cm LEFT ATRIUM           Index        RIGHT ATRIUM           Index LA diam:      3.30 cm 1.58 cm/m   RA Area:     31.90 cm LA Vol (A2C): 32.0 ml 15.31 ml/m  RA Volume:   138.00 ml 66.01 ml/m LA Vol (A4C): 54.2 ml 25.93 ml/m  AORTIC VALVE AV Vmax:      171.00 cm/s AV Vmean:     118.000 cm/s AV VTI:       0.290 m AV Peak Grad: 11.7 mmHg AV Mean Grad: 6.5 mmHg  AORTA Ao Root diam: 2.70 cm Ao Asc diam:  3.10 cm TRICUSPID VALVE TR Peak grad:   52.4 mmHg TR Vmax:        362.00 cm/s  SHUNTS Systemic Diam: 2.10 cm Donato Schultz MD Electronically signed by Donato Schultz MD Signature Date/Time: 02/04/2021/11:56:40 AM    Final         Scheduled Meds:  aspirin EC  81 mg Oral Daily   atorvastatin  80 mg Oral QHS   fluticasone  1 spray Each Nare Daily   heparin  injection (subcutaneous)  5,000 Units Subcutaneous Q8H   insulin aspart  0-9 Units Subcutaneous TID WC   insulin glargine-yfgn  5 Units Subcutaneous Daily   methylPREDNISolone (SOLU-MEDROL) injection  80 mg Intravenous BID   metoprolol succinate  25 mg Oral Daily   pantoprazole  40 mg Oral Daily   Continuous Infusions:  azithromycin 500 mg (02/04/21 2128)   cefTRIAXone (ROCEPHIN)  IV 1 g (02/04/21 2343)     LOS: 2 days      Huey Bienenstock, MD [facility]   To contact the attending provider between 7A-7P or the covering provider during after hours 7P-7A, please log into the web site [email or web address] and access using universal [place] password for that web site. If you do not have the password, please call the hospital operator.  02/05/2021, 10:54 AM

## 2021-02-06 DIAGNOSIS — E870 Hyperosmolality and hypernatremia: Secondary | ICD-10-CM

## 2021-02-06 DIAGNOSIS — J441 Chronic obstructive pulmonary disease with (acute) exacerbation: Secondary | ICD-10-CM | POA: Diagnosis not present

## 2021-02-06 DIAGNOSIS — J9601 Acute respiratory failure with hypoxia: Secondary | ICD-10-CM | POA: Diagnosis not present

## 2021-02-06 DIAGNOSIS — J189 Pneumonia, unspecified organism: Secondary | ICD-10-CM | POA: Diagnosis not present

## 2021-02-06 LAB — CBC
HCT: 49.9 % — ABNORMAL HIGH (ref 36.0–46.0)
Hemoglobin: 14.2 g/dL (ref 12.0–15.0)
MCH: 25.2 pg — ABNORMAL LOW (ref 26.0–34.0)
MCHC: 28.5 g/dL — ABNORMAL LOW (ref 30.0–36.0)
MCV: 88.6 fL (ref 80.0–100.0)
Platelets: 230 10*3/uL (ref 150–400)
RBC: 5.63 MIL/uL — ABNORMAL HIGH (ref 3.87–5.11)
RDW: 16 % — ABNORMAL HIGH (ref 11.5–15.5)
WBC: 12.4 10*3/uL — ABNORMAL HIGH (ref 4.0–10.5)
nRBC: 0.2 % (ref 0.0–0.2)

## 2021-02-06 LAB — BASIC METABOLIC PANEL
Anion gap: 9 (ref 5–15)
BUN: 36 mg/dL — ABNORMAL HIGH (ref 8–23)
CO2: 35 mmol/L — ABNORMAL HIGH (ref 22–32)
Calcium: 10.4 mg/dL — ABNORMAL HIGH (ref 8.9–10.3)
Chloride: 106 mmol/L (ref 98–111)
Creatinine, Ser: 0.84 mg/dL (ref 0.44–1.00)
GFR, Estimated: >60 mL/min (ref >60)
Glucose, Bld: 327 mg/dL — ABNORMAL HIGH (ref 70–99)
Potassium: 4.3 mmol/L (ref 3.5–5.1)
Sodium: 150 mmol/L — ABNORMAL HIGH (ref 135–145)

## 2021-02-06 LAB — GLUCOSE, CAPILLARY
Glucose-Capillary: 304 mg/dL — ABNORMAL HIGH (ref 70–99)
Glucose-Capillary: 342 mg/dL — ABNORMAL HIGH (ref 70–99)
Glucose-Capillary: 385 mg/dL — ABNORMAL HIGH (ref 70–99)
Glucose-Capillary: 465 mg/dL — ABNORMAL HIGH (ref 70–99)

## 2021-02-06 LAB — CULTURE, BLOOD (ROUTINE X 2)

## 2021-02-06 LAB — PROCALCITONIN: Procalcitonin: <0.1 ng/mL

## 2021-02-06 LAB — URINE CULTURE: Culture: 40000 — AB

## 2021-02-06 MED ORDER — METHYLPREDNISOLONE SODIUM SUCC 40 MG IJ SOLR
40.0000 mg | Freq: Two times a day (BID) | INTRAMUSCULAR | Status: DC
  Administered 2021-02-06 – 2021-02-07 (×2): 40 mg via INTRAVENOUS
  Filled 2021-02-06 (×2): qty 1

## 2021-02-06 MED ORDER — INSULIN GLARGINE-YFGN 100 UNIT/ML ~~LOC~~ SOLN
25.0000 [IU] | Freq: Every day | SUBCUTANEOUS | Status: DC
  Administered 2021-02-06: 25 [IU] via SUBCUTANEOUS
  Filled 2021-02-06: qty 0.25

## 2021-02-06 MED ORDER — INSULIN ASPART 100 UNIT/ML IJ SOLN
0.0000 [IU] | Freq: Three times a day (TID) | INTRAMUSCULAR | Status: DC
  Administered 2021-02-06: 15 [IU] via SUBCUTANEOUS

## 2021-02-06 MED ORDER — INSULIN GLARGINE-YFGN 100 UNIT/ML ~~LOC~~ SOLN
10.0000 [IU] | Freq: Once | SUBCUTANEOUS | Status: AC
  Administered 2021-02-06: 10 [IU] via SUBCUTANEOUS
  Filled 2021-02-06: qty 0.1

## 2021-02-06 MED ORDER — INSULIN GLARGINE-YFGN 100 UNIT/ML ~~LOC~~ SOLN
40.0000 [IU] | Freq: Every day | SUBCUTANEOUS | Status: DC
  Filled 2021-02-06: qty 0.4

## 2021-02-06 MED ORDER — DEXTROSE 5 % IV SOLN: INTRAVENOUS | Status: DC

## 2021-02-06 MED ORDER — INSULIN ASPART 100 UNIT/ML IJ SOLN
5.0000 [IU] | Freq: Three times a day (TID) | INTRAMUSCULAR | Status: DC
  Administered 2021-02-06 (×2): 5 [IU] via SUBCUTANEOUS

## 2021-02-06 MED ORDER — INSULIN ASPART 100 UNIT/ML IJ SOLN
15.0000 [IU] | Freq: Once | INTRAMUSCULAR | Status: AC
  Administered 2021-02-06: 15 [IU] via SUBCUTANEOUS

## 2021-02-06 NOTE — Progress Notes (Signed)
Pt CBG 465, no insulin scheduled at the bedtime. D5% infusing at 30 ml/hr for hypernatremia. MD on call notified. New orders placed.

## 2021-02-06 NOTE — Progress Notes (Signed)
PROGRESS NOTE    Courtney [name]  FYB:[id number] DOB: [birth year] DOA: 02/03/2021 PCP: Jordan Hawks, PA-C    Chief Complaint  Patient presents with   Altered Mental Status   Shortness of Breath    Brief Narrative:   Courtney [name] is a 75 y.o. female with medical history significant for COPD, type 2 diabetes mellitus, hypertension, hyperlipidemia, chronic diastolic heart failure, who is admitted to [facility] on 02/03/2021 with acute hypoxic respiratory distress after presenting from snf to [facility] ED for evaluation of increased somnolence.  ED work-up significant for temperature of 99.3, oxygen saturation 88% on room air, chest x-ray significant for vascular congestion, she is with diffuse wheezing and diminished air entry, admitted for further management.  Assessment & Plan:   Principal Problem:   Acute respiratory failure with hypoxia (HCC) Active Problems:   Hypertension   Acute exacerbation of chronic obstructive pulmonary disease (COPD) (HCC)   Acute on chronic diastolic CHF (congestive heart failure) (HCC)   Severe sepsis (HCC)   CAP (community acquired pneumonia)   Acute metabolic encephalopathy   DM2 (diabetes mellitus, type 2) (HCC)    Acute hypoxic respiratory failure due to COPD exacerbation/Pneumonia: -Patient's with significantly diminished air entry bilaterally, and significant wheezing, she is with known history of COPD, continue with IV steroids.will decrease to 60 mg IV BID. -We will continue with IV Rocephin and azithromycin in the setting of productive cough, procalcitonin is reassuring. -Chest x-ray 1/8 with left lower opacity, some pleural effusion and volume overload. -Patient remains with significant upper airway congestion, she was encouraged with incentive spirometry and flutter valve, started on Mucinex as well.    Acute on chronic diastolic heart failure: - In the context of a documented history of chronic diastolic heart failure, with most  recent echocardiogram from June 2021 notable for grade 2 diastolic dysfunction with LVEF 60 to 65%, -P is elevated at 725. -Recurrent evidence of pulmonary edema, pleural effusion with imaging, but diuresis is limited due to hypernatremia.  Hypernatremia -She is not receiving any IV diuresis over last 48 hours, but continues to worsening likely due to her her thick liquid intake, but on D5W.   Acute metabolic encephalopathy:  -Due to above, but she is more awake and alert today but remains somnolent . -TSH is low at 2, but this is likely sick euthyroid syndrome, free T4 mildly elevated at 1.8.   Type 2 diabetes mellitus:  - Documented history of such, with most recent hemoglobin A1c noted to be 7.8% when checked in June 2021.  -  On basal insulin in the form of 12 units subcu nightly as well as sliding scale Humalog 3 times daily with meals as an outpatient.  -CBG uncontrolled, will increase her Lantus.    Essential hypertension:  -home medications iNorvasc and Toprol-XL.  Will resume when more stable    DVT prophylaxis: Heparin Code Status: DNR Family Communication: D/W husband by phone Disposition:   Status is: Inpatient  Remains inpatient appropriate because:IV treatments appropriate due to intensity of illness or inability to take PO  Dispo: The patient is from: Home              Anticipated d/c is to: SNF              Patient currently is not medically stable to d/c.   Difficult to place patient No       Consultants:  None  Subjective:  Difficult events overnight as discussed with  staff, cough has improved per patient. Objective: Vitals:   02/05/21 1952 02/06/21 0000 02/06/21 0430 02/06/21 0756  BP: (!) 160/81 (!) 159/84 137/85 139/82  Pulse:      Resp: 20 18 18 19   Temp: 98.4 F (36.9 C) 98.2 F (36.8 C) 98 F (36.7 C) 98.3 F (36.8 C)  TempSrc: Oral Oral Oral Oral  SpO2: 90% 100% 98%   Weight:      Height:        Intake/Output Summary (Last 24  hours) at 02/06/2021 1234 Last data filed at 02/06/2021 0500 Gross per 24 hour  Intake --  Output 600 ml  Net -600 ml   Filed Weights   02/03/21 2240 02/04/21 1429  Weight: 107.5 kg 102.7 kg    Examination:   Awake, pleasant, confused a Symmetrical Chest wall movement, diminished air entry at the bases, wheezing has improved. RRR,No Gallops,Rubs or new Murmurs, No Parasternal Heave +ve B.Sounds, Abd Soft, No tenderness, No rebound - guarding or rigidity. No Cyanosis, Clubbing or edema, No new Rash or bruise     Data Reviewed: I have personally reviewed following labs and imaging studies  CBC: Recent Labs  Lab 02/03/21 2212 02/04/21 0400 02/04/21 0411 02/05/21 0207 02/06/21 0204  WBC 12.2* 13.7*  --  12.3* 12.4*  NEUTROABS 9.9* 12.7*  --   --   --   HGB 14.6 14.8 16.3* 13.6 14.2  HCT 51.4* 55.2* 48.0* 48.1* 49.9*  MCV 89.5 94.0  --  88.6 88.6  PLT 197 134*  --  210 230    Basic Metabolic Panel: Recent Labs  Lab 02/03/21 2212 02/04/21 0400 02/04/21 0411 02/04/21 1020 02/05/21 0207 02/05/21 1405 02/05/21 1820 02/06/21 0204  NA 146* 146* 147*  --  147*  --   --  150*  K 5.1 5.6* 5.2*  --  5.7* >7.5* 5.6* 4.3  CL 109 106  --   --  109  --   --  106  CO2 29 26  --   --  28  --   --  35*  GLUCOSE 138* 174*  --   --  252*  --   --  327*  BUN 35* 37*  --   --  38*  --   --  36*  CREATININE 0.88 0.92  --   --  0.77  --   --  0.84  CALCIUM 10.3 10.1  --   --  10.1  --   --  10.4*  MG  --  2.3  --  2.3  --   --   --   --   PHOS  --  3.1  --   --   --   --   --   --     GFR: Estimated Creatinine Clearance: 64.8 mL/min (by C-G formula based on SCr of 0.84 mg/dL).  Liver Function Tests: Recent Labs  Lab 02/03/21 2212 02/04/21 0400  AST 31 33  ALT 58* 59*  ALKPHOS 142* 138*  BILITOT 0.4 0.6  PROT 7.2 7.4  ALBUMIN 3.1* 3.1*    CBG: Recent Labs  Lab 02/05/21 1555 02/05/21 1650 02/05/21 2003 02/06/21 0755 02/06/21 1206  GLUCAP 270* 247* 402* 304* 342*      Recent Results (from the past 240 hour(s))  Blood Culture (routine x 2)     Status: None (Preliminary result)   Collection Time: 02/03/21 10:12 PM   Specimen: BLOOD RIGHT HAND  Result Value Ref Range Status  Specimen Description BLOOD RIGHT HAND  Final   Special Requests   Final    BOTTLES DRAWN AEROBIC AND ANAEROBIC Blood Culture adequate volume   Culture   Final    NO GROWTH 3 DAYS Performed at [facility], [address] [place], [place] [address]    Report Status PENDING  Incomplete  Blood Culture (routine x 2)     Status: Abnormal   Collection Time: 02/03/21 10:24 PM   Specimen: BLOOD  Result Value Ref Range Status   Specimen Description BLOOD BLOOD RIGHT FOREARM  Final   Special Requests   Final    BOTTLES DRAWN AEROBIC AND ANAEROBIC Blood Culture results may not be optimal due to an inadequate volume of blood received in culture bottles   Culture  Setup Time   Final    GRAM POSITIVE COCCI IN CLUSTERS ANAEROBIC BOTTLE ONLY CRITICAL RESULT CALLED TO, READ BACK BY AND VERIFIED WITH: PHARMD JAMES LEDFORD 02/04/21@21 :46 BY TW    Culture (A)  Final    STAPHYLOCOCCUS HOMINIS THE SIGNIFICANCE OF ISOLATING THIS ORGANISM FROM A SINGLE SET OF BLOOD CULTURES WHEN MULTIPLE SETS ARE DRAWN IS UNCERTAIN. PLEASE NOTIFY THE MICROBIOLOGY DEPARTMENT WITHIN ONE WEEK IF SPECIATION AND SENSITIVITIES ARE REQUIRED. Performed at [facility], [address] [place], [place] [address]    Report Status 02/06/2021 FINAL  Final  Resp Panel by RT-PCR (Flu A&B, Covid) Nasopharyngeal Swab     Status: None   Collection Time: 02/03/21 10:24 PM   Specimen: Nasopharyngeal Swab; Nasopharyngeal(NP) swabs in vial transport medium  Result Value Ref Range Status   SARS Coronavirus 2 by RT PCR NEGATIVE NEGATIVE Final    Comment: (NOTE) SARS-CoV-2 target nucleic acids are NOT DETECTED.  The SARS-CoV-2 RNA is generally detectable in upper respiratory specimens during the acute phase of  infection. The lowest concentration of SARS-CoV-2 viral copies this assay can detect is 138 copies/mL. A negative result does not preclude SARS-Cov-2 infection and should not be used as the sole basis for treatment or other patient management decisions. A negative result may occur with  improper specimen collection/handling, submission of specimen other than nasopharyngeal swab, presence of viral mutation(s) within the areas targeted by this assay, and inadequate number of viral copies(<138 copies/mL). A negative result must be combined with clinical observations, patient history, and epidemiological information. The expected result is Negative.  Fact Sheet for Patients:  BloggerCourse.com  Fact Sheet for Healthcare Providers:  SeriousBroker.it  This test is no t yet approved or cleared by the [place] FDA and  has been authorized for detection and/or diagnosis of SARS-CoV-2 by FDA under an Emergency Use Authorization (EUA). This EUA will remain  in effect (meaning this test can be used) for the duration of the COVID-19 declaration under Section 564(b)(1) of the Act, 21 U.S.C.section 360bbb-3(b)(1), unless the authorization is terminated  or revoked sooner.       Influenza A by PCR NEGATIVE NEGATIVE Final   Influenza B by PCR NEGATIVE NEGATIVE Final    Comment: (NOTE) The Xpert Xpress SARS-CoV-2/FLU/RSV plus assay is intended as an aid in the diagnosis of influenza from Nasopharyngeal swab specimens and should not be used as a sole basis for treatment. Nasal washings and aspirates are unacceptable for Xpert Xpress SARS-CoV-2/FLU/RSV testing.  Fact Sheet for Patients: BloggerCourse.com  Fact Sheet for Healthcare Providers: SeriousBroker.it  This test is not yet approved or cleared by the [place] FDA and has been authorized for detection and/or diagnosis  of SARS-CoV-2  by FDA under an Emergency Use Authorization (EUA). This EUA will remain in effect (meaning this test can be used) for the duration of the COVID-19 declaration under Section 564(b)(1) of the Act, 21 U.S.C. section 360bbb-3(b)(1), unless the authorization is terminated or revoked.  Performed at [facility], [address], [place], [place] [address]   Blood Culture ID Panel (Reflexed)     Status: Abnormal   Collection Time: 02/03/21 10:24 PM  Result Value Ref Range Status   Enterococcus faecalis NOT DETECTED NOT DETECTED Final   Enterococcus Faecium NOT DETECTED NOT DETECTED Final   Listeria monocytogenes NOT DETECTED NOT DETECTED Final   Staphylococcus species DETECTED (A) NOT DETECTED Final    Comment: CRITICAL RESULT CALLED TO, READ BACK BY AND VERIFIED WITH: PHARMD JAMES LEDFORD 02/04/21@21 :46 BY TW    Staphylococcus aureus (BCID) NOT DETECTED NOT DETECTED Final   Staphylococcus epidermidis NOT DETECTED NOT DETECTED Final   Staphylococcus lugdunensis NOT DETECTED NOT DETECTED Final   Streptococcus species NOT DETECTED NOT DETECTED Final   Streptococcus agalactiae NOT DETECTED NOT DETECTED Final   Streptococcus pneumoniae NOT DETECTED NOT DETECTED Final   Streptococcus pyogenes NOT DETECTED NOT DETECTED Final   A.calcoaceticus-baumannii NOT DETECTED NOT DETECTED Final   Bacteroides fragilis NOT DETECTED NOT DETECTED Final   Enterobacterales NOT DETECTED NOT DETECTED Final   Enterobacter cloacae complex NOT DETECTED NOT DETECTED Final   Escherichia coli NOT DETECTED NOT DETECTED Final   Klebsiella aerogenes NOT DETECTED NOT DETECTED Final   Klebsiella oxytoca NOT DETECTED NOT DETECTED Final   Klebsiella pneumoniae NOT DETECTED NOT DETECTED Final   Proteus species NOT DETECTED NOT DETECTED Final   Salmonella species NOT DETECTED NOT DETECTED Final   Serratia marcescens NOT DETECTED NOT DETECTED Final   Haemophilus influenzae NOT DETECTED NOT DETECTED Final   Neisseria  meningitidis NOT DETECTED NOT DETECTED Final   Pseudomonas aeruginosa NOT DETECTED NOT DETECTED Final   Stenotrophomonas maltophilia NOT DETECTED NOT DETECTED Final   Candida albicans NOT DETECTED NOT DETECTED Final   Candida auris NOT DETECTED NOT DETECTED Final   Candida glabrata NOT DETECTED NOT DETECTED Final   Candida krusei NOT DETECTED NOT DETECTED Final   Candida parapsilosis NOT DETECTED NOT DETECTED Final   Candida tropicalis NOT DETECTED NOT DETECTED Final   Cryptococcus neoformans/gattii NOT DETECTED NOT DETECTED Final    Comment: Performed at [facility], [address] [place], [place] [address]  Urine Culture     Status: Abnormal   Collection Time: 02/04/21  6:30 AM   Specimen: In/Out Cath Urine  Result Value Ref Range Status   Specimen Description IN/OUT CATH URINE  Final   Special Requests   Final    NONE Performed at [facility], [address] [place], [place] [address]    Culture (A)  Final    40,000 COLONIES/mL METHICILLIN RESISTANT STAPHYLOCOCCUS AUREUS   Report Status 02/06/2021 FINAL  Final   Organism ID, Bacteria METHICILLIN RESISTANT STAPHYLOCOCCUS AUREUS (A)  Final      Susceptibility   Methicillin resistant staphylococcus aureus - MIC*    CIPROFLOXACIN >=8 RESISTANT Resistant     GENTAMICIN <=0.5 SENSITIVE Sensitive     NITROFURANTOIN <=16 SENSITIVE Sensitive     OXACILLIN >=4 RESISTANT Resistant     TETRACYCLINE <=1 SENSITIVE Sensitive     VANCOMYCIN 1 SENSITIVE Sensitive     TRIMETH/SULFA >=320 RESISTANT Resistant     CLINDAMYCIN <=0.25 SENSITIVE Sensitive  RIFAMPIN <=0.5 SENSITIVE Sensitive     Inducible Clindamycin NEGATIVE Sensitive     * 40,000 COLONIES/mL METHICILLIN RESISTANT STAPHYLOCOCCUS AUREUS         Radiology Studies: DG Chest Port 1 View  Result Date: 02/05/2021 CLINICAL DATA:  Altered mental status, shortness of breath. EXAM: PORTABLE CHEST 1 VIEW COMPARISON:  February 03, 2021 FINDINGS: The cardiomediastinal  silhouette is enlarged. Calcific atherosclerotic disease and tortuosity of the aorta. Streaky peribronchial opacities. Left lower lobe atelectasis versus airspace consolidation. Probable right pleural effusion. Osseous structures are without acute abnormality. Soft tissues are grossly normal. IMPRESSION: 1. Left lower lobe atelectasis versus airspace consolidation. 2. Probable right pleural effusion. 3. Interstitial pulmonary edema. Electronically Signed   By: Ted Mcalpine M.D.   On: 02/05/2021 14:50        Scheduled Meds:  aspirin EC  81 mg Oral Daily   atorvastatin  80 mg Oral QHS   fluticasone  1 spray Each Nare Daily   guaiFENesin  1,200 mg Oral BID   heparin injection (subcutaneous)  5,000 Units Subcutaneous Q8H   insulin aspart  0-9 Units Subcutaneous TID WC & HS   insulin aspart  5 Units Subcutaneous TID WC   insulin glargine-yfgn  25 Units Subcutaneous Daily   methylPREDNISolone (SOLU-MEDROL) injection  80 mg Intravenous BID   metoprolol succinate  25 mg Oral Daily   pantoprazole  40 mg Oral Daily   Continuous Infusions:  azithromycin 500 mg (02/05/21 2133)   cefTRIAXone (ROCEPHIN)  IV 1 g (02/05/21 2252)     LOS: 3 days      Huey Bienenstock, MD [facility]   To contact the attending provider between 7A-7P or the covering provider during after hours 7P-7A, please log into the web site [email or web address] and access using universal [place] password for that web site. If you do not have the password, please call the hospital operator.  02/06/2021, 12:34 PM

## 2021-02-06 NOTE — Evaluation (Signed)
Physical Therapy Evaluation and Discharge Patient Details Name: Courtney [name] MRN: [id number] DOB: 1946-02-22 Today's Date: 02/06/2021  History of Present Illness  Pt is a 75 y.o. F who presents with acute hypoxic respiratory distress due to COPD exacerbation. Significant PMH: COPD, type 2 diabetes mellitus, HTN, chronic diastolic heart failure.  Clinical Impression  Per chart review, prior to admission, pt is a resident at [facility], requires [employer] transfer for out of bed mobility, and assist for ADL's. Pt is likely fairly close to her functional baseline. She is able to perform bed mobility (rolling) with moderate assist. Performed maxi move transfer out of bed to chair. SpO2 98% on 3L O2. Recommend nursing staff perform maxi move transfers as tolerated to promote out of bed mobility for respiratory status. No further acute PT needs. Thank you for this consult.      Recommendations for follow up therapy are one component of a multi-disciplinary discharge planning process, led by the attending physician.  Recommendations may be updated based on patient status, additional functional criteria and insurance authorization.  Follow Up Recommendations SNF (LTC at [facility])    Equipment Recommendations  None recommended by PT    Recommendations for Other Services       Precautions / Restrictions Precautions Precautions: Fall Restrictions Weight Bearing Restrictions: No      Mobility  Bed Mobility Overal bed mobility: Needs Assistance Bed Mobility: Rolling Rolling: Mod assist         General bed mobility comments: Cues for bending knee up and reaching towards contralateral side in order to roll, pt holding onto bed rail, requires modA to execute and guide hips into sidelying to both R/L    Transfers                 General transfer comment: Maxi move lift transfer to chair  Ambulation/Gait                Stairs            Wheelchair  Mobility    Modified Rankin (Stroke Patients Only)       Balance                                             Pertinent Vitals/Pain Pain Assessment: No/denies pain    Home Living Family/patient expects to be discharged to:: Skilled nursing facility                 Additional Comments: [employer]    Prior Function Level of Independence: Needs assistance   Gait / Transfers Assistance Needed: hoyer lift transfer out of bed  ADL's / Homemaking Assistance Needed: requires assist with ADL's        Hand Dominance        Extremity/Trunk Assessment   Upper Extremity Assessment Upper Extremity Assessment: RUE deficits/detail;LUE deficits/detail;Generalized weakness RUE Deficits / Details: AROM WFL LUE Deficits / Details: AROM WFL    Lower Extremity Assessment Lower Extremity Assessment: Generalized weakness;RLE deficits/detail;LLE deficits/detail RLE Deficits / Details: Ankle dorsiflexion limited ~30 degrees from neutral LLE Deficits / Details: AROM WFL       Communication   Communication: No difficulties  Cognition Arousal/Alertness: Awake/alert Behavior During Therapy: Flat affect Overall Cognitive Status: No family/caregiver present to determine baseline cognitive functioning  General Comments: Pt A&Ox1, oriented to self only. Pt states she is at [facility] and month was April. Was able to correctly state year was "22." able to follow > 75% of 1 step commands      General Comments      Exercises     Assessment/Plan    PT Assessment Patent does not need any further PT services  PT Problem List         PT Treatment Interventions      PT Goals (Current goals can be found in the Care Plan section)  Acute Rehab PT Goals Patient Stated Goal: to drink PT Goal Formulation: All assessment and education complete, DC therapy    Frequency     Barriers to discharge         Co-evaluation               AM-PAC PT "6 Clicks" Mobility  Outcome Measure Help needed turning from your back to your side while in a flat bed without using bedrails?: A Lot Help needed moving from lying on your back to sitting on the side of a flat bed without using bedrails?: Total Help needed moving to and from a bed to a chair (including a wheelchair)?: Total Help needed standing up from a chair using your arms (e.g., wheelchair or bedside chair)?: Total Help needed to walk in hospital room?: Total Help needed climbing 3-5 steps with a railing? : Total 6 Click Score: 7    End of Session Equipment Utilized During Treatment: Oxygen Activity Tolerance: Patient tolerated treatment well Patient left: in chair;with call bell/phone within reach;with chair alarm set Nurse Communication: Mobility status;Need for lift equipment PT Visit Diagnosis: Muscle weakness (generalized) (M62.81)    Time: 3662-9476 PT Time Calculation (min) (ACUTE ONLY): 26 min   Charges:   PT Evaluation $PT Eval Moderate Complexity: 1 Mod PT Treatments $Therapeutic Activity: 8-22 mins        Lillia Pauls, PT, DPT Acute Rehabilitation Services Pager [phone] Office [phone]   Norval Morton 02/06/2021, 10:10 AM

## 2021-02-07 ENCOUNTER — Inpatient Hospital Stay (HOSPITAL_COMMUNITY): Payer: Medicare (Managed Care)

## 2021-02-07 DIAGNOSIS — J441 Chronic obstructive pulmonary disease with (acute) exacerbation: Secondary | ICD-10-CM | POA: Diagnosis not present

## 2021-02-07 DIAGNOSIS — J189 Pneumonia, unspecified organism: Secondary | ICD-10-CM | POA: Diagnosis not present

## 2021-02-07 DIAGNOSIS — J9601 Acute respiratory failure with hypoxia: Secondary | ICD-10-CM | POA: Diagnosis not present

## 2021-02-07 DIAGNOSIS — N3000 Acute cystitis without hematuria: Secondary | ICD-10-CM

## 2021-02-07 LAB — BASIC METABOLIC PANEL
Anion gap: 9 (ref 5–15)
BUN: 35 mg/dL — ABNORMAL HIGH (ref 8–23)
CO2: 31 mmol/L (ref 22–32)
Calcium: 9.8 mg/dL (ref 8.9–10.3)
Chloride: 112 mmol/L — ABNORMAL HIGH (ref 98–111)
Creatinine, Ser: 0.87 mg/dL (ref 0.44–1.00)
GFR, Estimated: >60 mL/min (ref >60)
Glucose, Bld: 280 mg/dL — ABNORMAL HIGH (ref 70–99)
Potassium: 4.7 mmol/L (ref 3.5–5.1)
Sodium: 152 mmol/L — ABNORMAL HIGH (ref 135–145)

## 2021-02-07 LAB — CBC
HCT: 50.1 % — ABNORMAL HIGH (ref 36.0–46.0)
Hemoglobin: 14.4 g/dL (ref 12.0–15.0)
MCH: 24.7 pg — ABNORMAL LOW (ref 26.0–34.0)
MCHC: 28.7 g/dL — ABNORMAL LOW (ref 30.0–36.0)
MCV: 85.8 fL (ref 80.0–100.0)
Platelets: 212 10*3/uL (ref 150–400)
RBC: 5.84 MIL/uL — ABNORMAL HIGH (ref 3.87–5.11)
RDW: 15.9 % — ABNORMAL HIGH (ref 11.5–15.5)
WBC: 17.4 10*3/uL — ABNORMAL HIGH (ref 4.0–10.5)
nRBC: 0.1 % (ref 0.0–0.2)

## 2021-02-07 LAB — GLUCOSE, CAPILLARY
Glucose-Capillary: 166 mg/dL — ABNORMAL HIGH (ref 70–99)
Glucose-Capillary: 172 mg/dL — ABNORMAL HIGH (ref 70–99)
Glucose-Capillary: 278 mg/dL — ABNORMAL HIGH (ref 70–99)
Glucose-Capillary: 455 mg/dL — ABNORMAL HIGH (ref 70–99)
Glucose-Capillary: 456 mg/dL — ABNORMAL HIGH (ref 70–99)

## 2021-02-07 LAB — MYCOPLASMA PNEUMONIAE ANTIBODY, IGM: Mycoplasma pneumo IgM: <770 U/mL (ref 0–769)

## 2021-02-07 MED ORDER — INSULIN ASPART 100 UNIT/ML IJ SOLN
8.0000 [IU] | Freq: Three times a day (TID) | INTRAMUSCULAR | Status: DC
  Administered 2021-02-07 (×2): 8 [IU] via SUBCUTANEOUS

## 2021-02-07 MED ORDER — HYDRALAZINE HCL 25 MG PO TABS
25.0000 mg | ORAL_TABLET | Freq: Four times a day (QID) | ORAL | Status: DC
  Administered 2021-02-07 (×2): 25 mg via ORAL
  Filled 2021-02-07 (×3): qty 1

## 2021-02-07 MED ORDER — INSULIN ASPART 100 UNIT/ML IJ SOLN
0.0000 [IU] | Freq: Every day | INTRAMUSCULAR | Status: DC
  Administered 2021-02-08 – 2021-02-09 (×2): 3 [IU] via SUBCUTANEOUS
  Administered 2021-02-10: 5 [IU] via SUBCUTANEOUS
  Administered 2021-02-11: 2 [IU] via SUBCUTANEOUS

## 2021-02-07 MED ORDER — LINEZOLID 600 MG PO TABS
600.0000 mg | ORAL_TABLET | Freq: Two times a day (BID) | ORAL | Status: AC
  Administered 2021-02-07 – 2021-02-09 (×5): 600 mg via ORAL
  Filled 2021-02-07 (×6): qty 1

## 2021-02-07 MED ORDER — METHYLPREDNISOLONE SODIUM SUCC 40 MG IJ SOLR
40.0000 mg | Freq: Every day | INTRAMUSCULAR | Status: DC
  Administered 2021-02-08: 40 mg via INTRAVENOUS
  Filled 2021-02-07: qty 1

## 2021-02-07 MED ORDER — SACCHAROMYCES BOULARDII 250 MG PO CAPS
250.0000 mg | ORAL_CAPSULE | Freq: Two times a day (BID) | ORAL | Status: DC
  Administered 2021-02-08 – 2021-02-13 (×11): 250 mg via ORAL
  Filled 2021-02-07 (×11): qty 1

## 2021-02-07 MED ORDER — INSULIN ASPART 100 UNIT/ML IJ SOLN
0.0000 [IU] | Freq: Three times a day (TID) | INTRAMUSCULAR | Status: DC
  Administered 2021-02-07: 20 [IU] via SUBCUTANEOUS
  Administered 2021-02-07: 4 [IU] via SUBCUTANEOUS
  Administered 2021-02-08: 3 [IU] via SUBCUTANEOUS

## 2021-02-07 MED ORDER — AMLODIPINE BESYLATE 10 MG PO TABS
10.0000 mg | ORAL_TABLET | Freq: Every day | ORAL | Status: DC
  Administered 2021-02-07 – 2021-02-13 (×7): 10 mg via ORAL
  Filled 2021-02-07 (×7): qty 1

## 2021-02-07 MED ORDER — ONDANSETRON HCL 4 MG/2ML IJ SOLN
4.0000 mg | Freq: Four times a day (QID) | INTRAMUSCULAR | Status: DC | PRN
  Administered 2021-02-07: 4 mg via INTRAVENOUS
  Filled 2021-02-07: qty 2

## 2021-02-07 MED ORDER — SENNA 8.6 MG PO TABS
2.0000 | ORAL_TABLET | Freq: Every day | ORAL | Status: DC
  Administered 2021-02-08 – 2021-02-13 (×6): 17.2 mg via ORAL
  Filled 2021-02-07 (×6): qty 2

## 2021-02-07 MED ORDER — INSULIN GLARGINE-YFGN 100 UNIT/ML ~~LOC~~ SOLN
50.0000 [IU] | Freq: Every day | SUBCUTANEOUS | Status: DC
  Administered 2021-02-07: 50 [IU] via SUBCUTANEOUS
  Filled 2021-02-07 (×2): qty 0.5

## 2021-02-07 MED ORDER — SERTRALINE HCL 50 MG PO TABS
50.0000 mg | ORAL_TABLET | Freq: Every day | ORAL | Status: DC
  Administered 2021-02-08 – 2021-02-13 (×6): 50 mg via ORAL
  Filled 2021-02-07 (×6): qty 1

## 2021-02-07 NOTE — Progress Notes (Addendum)
Speech Language Pathology Treatment: Dysphagia  Patient Details Name: Courtney [name] MRN: [id number] DOB: 1946/02/08 Today's Date: 02/07/2021 Time: 1040-1055 SLP Time Calculation (min) (ACUTE ONLY): 15 min  Assessment / Plan / Recommendation Clinical Impression  Per OT report pt had some coughing with thickened liquids at breakfast. SLP was able to give a full container of pudding, 4 oz of nectar and sips of water as well as full graham cracker that pt masticated well in contrast with prior oral holding in ED. Pt did have two instances of coughing (one that could have been related to regurgitation). Will proceed with instrumental assessment given inconsistent presentation. Pt may continue puree/nectar diet until MBS complete with new recommendations  HPI HPI: Courtney [name] is a 75 y.o. female with medical history significant for COPD, type 2 diabetes mellitus, hypertension, hyperlipidemia, chronic diastolic heart failure, who is admitted to [facility] on 02/03/2021 with acute hypoxic respiratory distress after presenting from snf to [facility] ED for evaluation of increased somnolence.  ED work-up significant for temperature of 99.3, oxygen saturation 88% on room air, chest x-ray significant for vascular congestion, she is with diffuse wheezing and diminished air entry, admitted for further management.Pt coughing after [place].      SLP Plan  MBS      Recommendations for follow up therapy are one component of a multi-disciplinary discharge planning process, led by the attending physician.  Recommendations may be updated based on patient status, additional functional criteria and insurance authorization.    Recommendations  Diet recommendations: Dysphagia 1 (puree);Nectar-thick liquid Liquids provided via: Cup;Straw Medication Administration: Crushed with puree Supervision: Staff to assist with self feeding;Full supervision/cueing for compensatory strategies Compensations: Slow rate;Small  sips/bites Postural Changes and/or Swallow Maneuvers: Seated upright 90 degrees                Oral Care Recommendations: Oral care BID Follow up Recommendations: Skilled Nursing facility Plan: MBS       GO                Courtney [name], Riley Nearing  02/07/2021, 11:11 AM

## 2021-02-07 NOTE — Progress Notes (Signed)
Pt BP elevated 198/96. Pt asymptomatic. MD on call notified New orders placed.

## 2021-02-07 NOTE — Progress Notes (Signed)
PROGRESS NOTE    ROSILYN COACHMAN  ZOX:[id number] DOB: 07-24-1945 DOA: 02/03/2021 PCP: Jordan Hawks, PA-C    Chief Complaint  Patient presents with   Altered Mental Status   Shortness of Breath    Brief Narrative:   Courtney [name] is a 75 y.o. female with medical history significant for COPD, type 2 diabetes mellitus, hypertension, hyperlipidemia, chronic diastolic heart failure, who is admitted to [facility] on 02/03/2021 with acute hypoxic respiratory distress after presenting from snf to [facility] ED for evaluation of increased somnolence.  ED work-up significant for temperature of 99.3, oxygen saturation 88% on room air, chest x-ray significant for vascular congestion, she is with diffuse wheezing and diminished air entry, admitted for further management.  Assessment & Plan:   Principal Problem:   Acute respiratory failure with hypoxia (HCC) Active Problems:   Hypertension   Acute exacerbation of chronic obstructive pulmonary disease (COPD) (HCC)   Acute on chronic diastolic CHF (congestive heart failure) (HCC)   Severe sepsis (HCC)   CAP (community acquired pneumonia)   Acute metabolic encephalopathy   DM2 (diabetes mellitus, type 2) (HCC)    Acute hypoxic respiratory failure due to COPD exacerbation/Pneumonia: -Patient's with significantly diminished air entry bilaterally, and significant wheezing, she is with known history of COPD, wheezing continues to improve, so we will continue to taper her IV steroids. - Will treat with IV Rocephin and azithromycin for 5 days. -Chest x-ray 1/8 with left lower opacity, some pleural effusion and volume overload. -Patient remains with significant upper airway congestion, she was encouraged with incentive spirometry and flutter valve, started on Mucinex as well.    Acute on chronic diastolic heart failure: - In the context of a documented history of chronic diastolic heart failure, with most recent echocardiogram from June 2021  notable for grade 2 diastolic dysfunction with LVEF 60 to 65%, -BNP is elevated at 725. -Recurrent evidence of pulmonary edema, pleural effusion with imaging, but diuresis is limited due to hypernatremia.  Hypernatremia -Worsening despite D5W, and this is likely related to poor oral intake as she is on thickened liquid, will increase IV fluid to 60 cc/h   MRSA UTI - urine culture difficult for MRSA, patient endorses dysuria, she remains confused despite treating her pneumonia, will treat her UTI with 3 days of Zyvox.   Acute metabolic encephalopathy:  -Due to above, but she is more awake and alert today but remains somnolent . -TSH is low at 2, but this is likely sick euthyroid syndrome, free T4 mildly elevated at 1.8. -As well patient with new dysphagia, she is on thickened liquid, I have obtained CT head which was with no acute findings.   Type 2 diabetes mellitus:  - Documented history of such, with most recent hemoglobin A1c noted to be 7.8% when checked in June 2021.  -CBG significantly elevated, especially she is receiving D5W, I have increased her Levemir, change sliding scale to resistant, and added Premeal NovoLog.    Essential hypertension:  -home medications iNorvasc and Toprol-XL.  Remains elevated, will add hydralazine.   DVT prophylaxis: Heparin Code Status: DNR Family Communication: D/W husband by phone, 10/9 Disposition:   Status is: Inpatient  Remains inpatient appropriate because:IV treatments appropriate due to intensity of illness or inability to take PO  Dispo: The patient is from: Home              Anticipated d/c is to: SNF  Patient currently is not medically stable to d/c.   Difficult to place patient No       Consultants:  None  Subjective:  Ports some nausea this afternoon, otherwise no significant events overnight. Objective: Vitals:   02/07/21 0445 02/07/21 0500 02/07/21 0627 02/07/21 0800  BP: (!) 172/108  (!) 198/96   Pulse:  83     Resp: 17  16   Temp: 97.7 F (36.5 C)   97.9 F (36.6 C)  TempSrc: Axillary   Oral  SpO2: 98%   100%  Weight:  104.8 kg    Height:        Intake/Output Summary (Last 24 hours) at 02/07/2021 1351 Last data filed at 02/07/2021 0443 Gross per 24 hour  Intake 1598.95 ml  Output 975 ml  Net 623.95 ml   Filed Weights   02/03/21 2240 02/04/21 1429 02/07/21 0500  Weight: 107.5 kg 102.7 kg 104.8 kg    Examination:   Awake, pleasant, confused Symmetrical Chest wall movement, wheezing has significantly subsided, she has diminished air entry at the bases. RRR,No Gallops,Rubs or new Murmurs, No Parasternal Heave +ve B.Sounds, Abd Soft, No tenderness, No rebound - guarding or rigidity. No Cyanosis, Clubbing or edema, No new Rash or bruise      Data Reviewed: I have personally reviewed following labs and imaging studies  CBC: Recent Labs  Lab 02/03/21 2212 02/04/21 0400 02/04/21 0411 02/05/21 0207 02/06/21 0204 02/07/21 0548  WBC 12.2* 13.7*  --  12.3* 12.4* 17.4*  NEUTROABS 9.9* 12.7*  --   --   --   --   HGB 14.6 14.8 16.3* 13.6 14.2 14.4  HCT 51.4* 55.2* 48.0* 48.1* 49.9* 50.1*  MCV 89.5 94.0  --  88.6 88.6 85.8  PLT 197 134*  --  210 230 212    Basic Metabolic Panel: Recent Labs  Lab 02/03/21 2212 02/04/21 0400 02/04/21 0411 02/04/21 1020 02/05/21 0207 02/05/21 1405 02/05/21 1820 02/06/21 0204 02/07/21 0148  NA 146* 146* 147*  --  147*  --   --  150* 152*  K 5.1 5.6* 5.2*  --  5.7* >7.5* 5.6* 4.3 4.7  CL 109 106  --   --  109  --   --  106 112*  CO2 29 26  --   --  28  --   --  35* 31  GLUCOSE 138* 174*  --   --  252*  --   --  327* 280*  BUN 35* 37*  --   --  38*  --   --  36* 35*  CREATININE 0.88 0.92  --   --  0.77  --   --  0.84 0.87  CALCIUM 10.3 10.1  --   --  10.1  --   --  10.4* 9.8  MG  --  2.3  --  2.3  --   --   --   --   --   PHOS  --  3.1  --   --   --   --   --   --   --     GFR: Estimated Creatinine Clearance: 63.3 mL/min (by  C-G formula based on SCr of 0.87 mg/dL).  Liver Function Tests: Recent Labs  Lab 02/03/21 2212 02/04/21 0400  AST 31 33  ALT 58* 59*  ALKPHOS 142* 138*  BILITOT 0.4 0.6  PROT 7.2 7.4  ALBUMIN 3.1* 3.1*    CBG: Recent Labs  Lab 02/06/21 1206  02/06/21 1704 02/06/21 2035 02/07/21 0809 02/07/21 1125  GLUCAP 342* 385* 465* 166* 172*     Recent Results (from the past 240 hour(s))  Blood Culture (routine x 2)     Status: None (Preliminary result)   Collection Time: 02/03/21 10:12 PM   Specimen: BLOOD RIGHT HAND  Result Value Ref Range Status   Specimen Description BLOOD RIGHT HAND  Final   Special Requests   Final    BOTTLES DRAWN AEROBIC AND ANAEROBIC Blood Culture adequate volume   Culture   Final    NO GROWTH 4 DAYS Performed at [facility] Lab, [address], [place], [place] [address]    Report Status PENDING  Incomplete  Blood Culture (routine x 2)     Status: Abnormal   Collection Time: 02/03/21 10:24 PM   Specimen: BLOOD  Result Value Ref Range Status   Specimen Description BLOOD BLOOD RIGHT FOREARM  Final   Special Requests   Final    BOTTLES DRAWN AEROBIC AND ANAEROBIC Blood Culture results may not be optimal due to an inadequate volume of blood received in culture bottles   Culture  Setup Time   Final    GRAM POSITIVE COCCI IN CLUSTERS ANAEROBIC BOTTLE ONLY CRITICAL RESULT CALLED TO, READ BACK BY AND VERIFIED WITH: PHARMD JAMES LEDFORD 02/04/21@21 :46 BY TW    Culture (A)  Final    STAPHYLOCOCCUS HOMINIS THE SIGNIFICANCE OF ISOLATING THIS ORGANISM FROM A SINGLE SET OF BLOOD CULTURES WHEN MULTIPLE SETS ARE DRAWN IS UNCERTAIN. PLEASE NOTIFY THE MICROBIOLOGY DEPARTMENT WITHIN ONE WEEK IF SPECIATION AND SENSITIVITIES ARE REQUIRED. Performed at [facility] Lab, [address] [place], [place] [address]    Report Status 02/06/2021 FINAL  Final  Resp Panel by RT-PCR (Flu A&B, Covid) Nasopharyngeal Swab     Status: None   Collection Time: 02/03/21 10:24  PM   Specimen: Nasopharyngeal Swab; Nasopharyngeal(NP) swabs in vial transport medium  Result Value Ref Range Status   SARS Coronavirus 2 by RT PCR NEGATIVE NEGATIVE Final    Comment: (NOTE) SARS-CoV-2 target nucleic acids are NOT DETECTED.  The SARS-CoV-2 RNA is generally detectable in upper respiratory specimens during the acute phase of infection. The lowest concentration of SARS-CoV-2 viral copies this assay can detect is 138 copies/mL. A negative result does not preclude SARS-Cov-2 infection and should not be used as the sole basis for treatment or other patient management decisions. A negative result may occur with  improper specimen collection/handling, submission of specimen other than nasopharyngeal swab, presence of viral mutation(s) within the areas targeted by this assay, and inadequate number of viral copies(<138 copies/mL). A negative result must be combined with clinical observations, patient history, and epidemiological information. The expected result is Negative.  Fact Sheet for Patients:  BloggerCourse.com  Fact Sheet for Healthcare Providers:  SeriousBroker.it  This test is no t yet approved or cleared by the [place] FDA and  has been authorized for detection and/or diagnosis of SARS-CoV-2 by FDA under an Emergency Use Authorization (EUA). This EUA will remain  in effect (meaning this test can be used) for the duration of the COVID-19 declaration under Section 564(b)(1) of the Act, 21 U.S.C.section 360bbb-3(b)(1), unless the authorization is terminated  or revoked sooner.       Influenza A by PCR NEGATIVE NEGATIVE Final   Influenza B by PCR NEGATIVE NEGATIVE Final    Comment: (NOTE) The Xpert Xpress SARS-CoV-2/FLU/RSV plus assay is intended as an aid in the diagnosis of influenza  from Nasopharyngeal swab specimens and should not be used as a sole basis for treatment. Nasal washings and aspirates are  unacceptable for Xpert Xpress SARS-CoV-2/FLU/RSV testing.  Fact Sheet for Patients: BloggerCourse.com  Fact Sheet for Healthcare Providers: SeriousBroker.it  This test is not yet approved or cleared by the [place] FDA and has been authorized for detection and/or diagnosis of SARS-CoV-2 by FDA under an Emergency Use Authorization (EUA). This EUA will remain in effect (meaning this test can be used) for the duration of the COVID-19 declaration under Section 564(b)(1) of the Act, 21 U.S.C. section 360bbb-3(b)(1), unless the authorization is terminated or revoked.  Performed at [facility] Lab, [address] [place], [place] [address]   Blood Culture ID Panel (Reflexed)     Status: Abnormal   Collection Time: 02/03/21 10:24 PM  Result Value Ref Range Status   Enterococcus faecalis NOT DETECTED NOT DETECTED Final   Enterococcus Faecium NOT DETECTED NOT DETECTED Final   Listeria monocytogenes NOT DETECTED NOT DETECTED Final   Staphylococcus species DETECTED (A) NOT DETECTED Final    Comment: CRITICAL RESULT CALLED TO, READ BACK BY AND VERIFIED WITH: PHARMD JAMES LEDFORD 02/04/21@21 :46 BY TW    Staphylococcus aureus (BCID) NOT DETECTED NOT DETECTED Final   Staphylococcus epidermidis NOT DETECTED NOT DETECTED Final   Staphylococcus lugdunensis NOT DETECTED NOT DETECTED Final   Streptococcus species NOT DETECTED NOT DETECTED Final   Streptococcus agalactiae NOT DETECTED NOT DETECTED Final   Streptococcus pneumoniae NOT DETECTED NOT DETECTED Final   Streptococcus pyogenes NOT DETECTED NOT DETECTED Final   A.calcoaceticus-baumannii NOT DETECTED NOT DETECTED Final   Bacteroides fragilis NOT DETECTED NOT DETECTED Final   Enterobacterales NOT DETECTED NOT DETECTED Final   Enterobacter cloacae complex NOT DETECTED NOT DETECTED Final   Escherichia coli NOT DETECTED NOT DETECTED Final   Klebsiella aerogenes NOT DETECTED NOT DETECTED  Final   Klebsiella oxytoca NOT DETECTED NOT DETECTED Final   Klebsiella pneumoniae NOT DETECTED NOT DETECTED Final   Proteus species NOT DETECTED NOT DETECTED Final   Salmonella species NOT DETECTED NOT DETECTED Final   Serratia marcescens NOT DETECTED NOT DETECTED Final   Haemophilus influenzae NOT DETECTED NOT DETECTED Final   Neisseria meningitidis NOT DETECTED NOT DETECTED Final   Pseudomonas aeruginosa NOT DETECTED NOT DETECTED Final   Stenotrophomonas maltophilia NOT DETECTED NOT DETECTED Final   Candida albicans NOT DETECTED NOT DETECTED Final   Candida auris NOT DETECTED NOT DETECTED Final   Candida glabrata NOT DETECTED NOT DETECTED Final   Candida krusei NOT DETECTED NOT DETECTED Final   Candida parapsilosis NOT DETECTED NOT DETECTED Final   Candida tropicalis NOT DETECTED NOT DETECTED Final   Cryptococcus neoformans/gattii NOT DETECTED NOT DETECTED Final    Comment: Performed at [facility], [address], [place], [place] [address]  Urine Culture     Status: Abnormal   Collection Time: 02/04/21  6:30 AM   Specimen: In/Out Cath Urine  Result Value Ref Range Status   Specimen Description IN/OUT CATH URINE  Final   Special Requests   Final    NONE Performed at [facility], [address], [place], [place] [address]    Culture (A)  Final    40,000 COLONIES/mL METHICILLIN RESISTANT STAPHYLOCOCCUS AUREUS   Report Status 02/06/2021 FINAL  Final   Organism ID, Bacteria METHICILLIN RESISTANT STAPHYLOCOCCUS AUREUS (A)  Final      Susceptibility   Methicillin resistant staphylococcus aureus - MIC*    CIPROFLOXACIN >=  8 RESISTANT Resistant     GENTAMICIN <=0.5 SENSITIVE Sensitive     NITROFURANTOIN <=16 SENSITIVE Sensitive     OXACILLIN >=4 RESISTANT Resistant     TETRACYCLINE <=1 SENSITIVE Sensitive     VANCOMYCIN 1 SENSITIVE Sensitive     TRIMETH/SULFA >=320 RESISTANT Resistant     CLINDAMYCIN <=0.25 SENSITIVE Sensitive     RIFAMPIN <=0.5 SENSITIVE Sensitive      Inducible Clindamycin NEGATIVE Sensitive     * 40,000 COLONIES/mL METHICILLIN RESISTANT STAPHYLOCOCCUS AUREUS         Radiology Studies: CT HEAD WO CONTRAST ( )  Result Date: 02/07/2021 CLINICAL DATA:  Mental status change EXAM: CT HEAD WITHOUT CONTRAST TECHNIQUE: Contiguous axial images were obtained from the base of the skull through the vertex without intravenous contrast. COMPARISON:  11/01/2018 FINDINGS: Brain: No evidence of acute infarction, hemorrhage, cerebral edema, mass, mass effect, or midline shift. Periventricular white matter changes, likely the sequela of chronic small vessel ischemic disease. No extra-axial fluid collection. Vascular: No hyperdense vessel or unexpected calcification. Skull: Normal. Negative for fracture or focal lesion. Sinuses/Orbits: No acute finding. Other: The mastoid air cells are well aerated. IMPRESSION: No acute intracranial process. Electronically Signed   By: Wiliam Ke M.D.   On: 02/07/2021 11:37   DG Swallowing Func-Speech Pathology  Result Date: 02/07/2021 Table formatting from the original result was not included. Objective Swallowing Evaluation: Type of Study: MBS-Modified Barium Swallow Study  Patient Details Name: LUNDYNN COHOON MRN: [id number] Date of Birth: Jan 09, 1946 Today's Date: 02/07/2021 Time: SLP Start Time (ACUTE ONLY): 1230 -SLP Stop Time (ACUTE ONLY): 1250 SLP Time Calculation (min) (ACUTE ONLY): 20 min Past Medical History: Past Medical History: Diagnosis Date  Anemia   Arthritis   Asthma   CAD (coronary artery disease)   COPD (chronic obstructive pulmonary disease) (HCC)   Diabetes mellitus   Hyperlipidemia   Hypertension   Myocardial infarct (HCC)   Postmenopausal atrophic vaginitis   Restless leg syndrome  Past Surgical History: Past Surgical History: Procedure Laterality Date  CHOLECYSTECTOMY  1970's  INCISION AND DRAINAGE ABSCESS Right 06/27/2018  Procedure: 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.;   Surgeon: Romie Levee, MD;  Location: [facility];  Service: General;  Laterality: Right;  INCISION AND DRAINAGE PERIRECTAL ABSCESS N/A 06/25/2018  Procedure: IRRIGATION AND DEBRIDEMENT PERIRECTAL ABCESS;  Surgeon: Romie Levee, MD;  Location: [facility];  Service: General;  Laterality: N/A;  IRRIGATION AND DEBRIDEMENT ABSCESS N/A 07/01/2018  Procedure: IRRIGATION AND DEBRIDEMENT ABSCESS;  Surgeon: Emelia Loron, MD;  Location: [facility];  Service: General;  Laterality: N/A;  OTHER SURGICAL HISTORY  1970's  hysterectomy  WOUND DEBRIDEMENT N/A 07/03/2018  Procedure: DEBRIDEMENT PELVIS WITH DRESSING CHANGE;  Surgeon: Emelia Loron, MD;  Location: [facility];  Service: General;  Laterality: N/A; HPI: SANII [name] is a 75 y.o. female with medical history significant for COPD, type 2 diabetes mellitus, hypertension, hyperlipidemia, chronic diastolic heart failure, who is admitted to [facility] on 02/03/2021 with acute hypoxic respiratory distress after presenting from snf to [facility] ED for evaluation of increased somnolence.  ED work-up significant for temperature of 99.3, oxygen saturation 88% on room air, chest x-ray significant for vascular congestion, she is with diffuse wheezing and diminished air entry, admitted for further management.Pt coughing after [place].  No data recorded Assessment / Plan / Recommendation CHL IP CLINICAL IMPRESSIONS 02/07/2021 Clinical Impression Pt demonstrates a primairly oral dysphagia with lingual rocking of bolus and decreased bolus cohesion with spillage of the head  of bolus with tail remaining in oral cavity. There are instances of aspiration before the swallow with thin and also after as pt transits the remaining bolus to an open airway. Sensation is inconsistent and often delayed. Pt also observed to have backflow of liquids from esophagus to pharynx with belching and esophageal sweep shows moderate stasis in cervical and thoracic esophageal column. Pt has a moderate risk of  postprandial aspiration. Recommend ongoing use of nectar thick liquids and pureed solids with esophageal precautions, primairly slow rate and upright positioning and sustaining upright position after meals. Will follow for potential for uprgrade which may be possible if mentation improves as pt was still weak and inattentive today. SLP Visit Diagnosis Dysphagia, oral phase (R13.11) Attention and concentration deficit following -- Frontal lobe and executive function deficit following -- Impact on safety and function Mild aspiration risk;Risk for inadequate nutrition/hydration   CHL IP TREATMENT RECOMMENDATION 02/07/2021 Treatment Recommendations Therapy as outlined in treatment plan below   Prognosis 02/07/2021 Prognosis for Safe Diet Advancement Fair Barriers to Reach Goals Cognitive deficits Barriers/Prognosis Comment -- CHL IP DIET RECOMMENDATION 02/07/2021 SLP Diet Recommendations Dysphagia 1 (Puree) solids;Nectar thick liquid Liquid Administration via Cup;Straw Medication Administration Crushed with puree Compensations Slow rate;Small sips/bites Postural Changes Seated upright at 90 degrees;Remain semi-upright after after feeds/meals (Comment)   No flowsheet data found.  CHL IP FOLLOW UP RECOMMENDATIONS 02/07/2021 Follow up Recommendations Skilled Nursing facility   [facility] IP FREQUENCY AND DURATION 02/07/2021 Speech Therapy Frequency (ACUTE ONLY) min 2x/week Treatment Duration 2 weeks      CHL IP ORAL PHASE 02/07/2021 Oral Phase Impaired Oral - Pudding Teaspoon -- Oral - Pudding Cup -- Oral - Honey Teaspoon -- Oral - Honey Cup -- Oral - Nectar Teaspoon -- Oral - Nectar Cup -- Oral - Nectar Straw Decreased bolus cohesion;Delayed oral transit;Premature spillage Oral - Thin Teaspoon -- Oral - Thin Cup -- Oral - Thin Straw Premature spillage;Decreased bolus cohesion;Delayed oral transit;Lingual pumping Oral - Puree Lingual pumping;Delayed oral transit;Decreased bolus cohesion;Premature spillage Oral - Mech Soft  Premature spillage;Decreased bolus cohesion;Delayed oral transit;Lingual pumping Oral - Regular -- Oral - Multi-Consistency -- Oral - Pill -- Oral Phase - Comment --  CHL IP PHARYNGEAL PHASE 02/07/2021 Pharyngeal Phase Impaired Pharyngeal- Pudding Teaspoon -- Pharyngeal -- Pharyngeal- Pudding Cup -- Pharyngeal -- Pharyngeal- Honey Teaspoon -- Pharyngeal -- Pharyngeal- Honey Cup -- Pharyngeal -- Pharyngeal- Nectar Teaspoon -- Pharyngeal -- Pharyngeal- Nectar Cup -- Pharyngeal -- Pharyngeal- Nectar Straw Penetration/Aspiration before swallow Pharyngeal Material enters airway, remains ABOVE vocal cords then ejected out;Material does not enter airway Pharyngeal- Thin Teaspoon -- Pharyngeal -- Pharyngeal- Thin Cup -- Pharyngeal -- Pharyngeal- Thin Straw Penetration/Aspiration before swallow;Penetration/Apiration after swallow;Delayed swallow initiation-pyriform sinuses Pharyngeal Material enters airway, passes BELOW cords without attempt by patient to eject out (silent aspiration);Material does not enter airway Pharyngeal- Puree Delayed swallow initiation-vallecula Pharyngeal -- Pharyngeal- Mechanical Soft Delayed swallow initiation-vallecula Pharyngeal -- Pharyngeal- Regular -- Pharyngeal -- Pharyngeal- Multi-consistency -- Pharyngeal -- Pharyngeal- Pill -- Pharyngeal -- Pharyngeal Comment --  CHL IP CERVICAL ESOPHAGEAL PHASE 02/07/2021 Cervical Esophageal Phase Impaired Pudding Teaspoon -- Pudding Cup -- Honey Teaspoon -- Honey Cup -- Nectar Teaspoon -- Nectar Cup -- Nectar Straw Esophageal backflow into the pharynx Thin Teaspoon -- Thin Cup -- Thin Straw -- Puree -- Mechanical Soft -- Regular -- Multi-consistency -- Pill -- Cervical Esophageal Comment -- DeBlois, Riley Nearing 02/07/2021, 1:43 PM  Scheduled Meds:  amLODipine  10 mg Oral Daily   aspirin EC  81 mg Oral Daily   atorvastatin  80 mg Oral QHS   fluticasone  1 spray Each Nare Daily   guaiFENesin  1,200 mg Oral BID   heparin  injection (subcutaneous)  5,000 Units Subcutaneous Q8H   hydrALAZINE  25 mg Oral Q6H   insulin aspart  0-20 Units Subcutaneous TID WC   insulin aspart  0-5 Units Subcutaneous QHS   insulin aspart  8 Units Subcutaneous TID WC   insulin glargine-yfgn  50 Units Subcutaneous Daily   methylPREDNISolone (SOLU-MEDROL) injection  40 mg Intravenous BID   metoprolol succinate  25 mg Oral Daily   pantoprazole  40 mg Oral Daily   Continuous Infusions:  azithromycin 500 mg (02/06/21 2139)   cefTRIAXone (ROCEPHIN)  IV 1 g (02/06/21 2306)   dextrose 60 mL/hr at 02/07/21 1056     LOS: 4 days      Huey Bienenstock, MD [facility]   To contact the attending provider between 7A-7P or the covering provider during after hours 7P-7A, please log into the web site [email or web address] and access using universal [place] password for that web site. If you do not have the password, please call the hospital operator.  02/07/2021, 1:51 PM

## 2021-02-07 NOTE — Plan of Care (Signed)

## 2021-02-07 NOTE — Progress Notes (Signed)
Modified Barium Swallow Progress Note  Patient Details  Name: Courtney [name] MRN: [id number] Date of Birth: [birth year]  Today's Date: 02/07/2021  Modified Barium Swallow completed.  Full report located under Chart Review in the Imaging Section.  Brief recommendations include the following:  Clinical Impression  Pt demonstrates a primarily oral dysphagia with lingual rocking of bolus and decreased bolus cohesion with spillage of the head of bolus with tail remaining in oral cavity. There are instances of aspiration before the swallow with thin and also after as pt transits the remaining bolus to an open airway. Sensation is inconsistent and often delayed. Pt also observed to have backflow of liquids from esophagus to pharynx with belching and esophageal sweep shows moderate stasis in cervical and thoracic esophageal column. Pt has a moderate risk of postprandial aspiration. Recommend ongoing use of nectar thick liquids and pureed solids with esophageal precautions, primairly slow rate and upright positioning and sustaining upright position after meals. Will follow for potential for uprgrade which may be possible if mentation improves as pt was still weak and inattentive today.   Swallow Evaluation Recommendations       SLP Diet Recommendations: Dysphagia 1 (Puree) solids;Nectar thick liquid   Liquid Administration via: Cup;Straw   Medication Administration: Crushed with puree       Compensations: Slow rate;Small sips/bites   Postural Changes: Seated upright at 90 degrees;Remain semi-upright after after feeds/meals (Comment)            Shepard Keltz, Riley Nearing 02/07/2021,1:30 PM

## 2021-02-07 NOTE — Consult Note (Addendum)
WOC Nurse ostomy consult note Patient receiving care in [facility] 2W04 Stoma type/location: RLQ colostomy Stomal assessment/size: Skin level Peristomal assessment: Not assessed today Treatment options for stomal/peristomal skin: Skin barrier ring Output: dark green/black Ostomy pouching: 1pc Convex Education provided: None Enrolled patient in [employer] DC program: No (Lives in a SNF)  1 piece fecal convex pouch [place] # [phone] Garnetta Buddy # [phone]   Thank you for the consult. WOC nurse will not follow at this time.   Please re-consult the WOC team if needed.  Renaldo Reel Katrinka Blazing, MSN, RN, CMSRN, Angus Seller, [facility] Wound Treatment Associate Pager [phone]

## 2021-02-07 NOTE — Evaluation (Signed)
Occupational Therapy Evaluation Patient Details Name: Courtney [name] MRN: [id number] DOB: 1945/08/11 Today's Date: 02/07/2021   History of Present Illness Pt is a 75 y.o. F who presents with acute hypoxic respiratory distress due to COPD exacerbation. Significant PMH: COPD, type 2 diabetes mellitus, HTN, chronic diastolic heart failure.   Clinical Impression   PTA patient reports self feeding and grooming but otherwise dependent for Adls.  She was admitted for above and is limited by problem list below, including impaired cognition, generalized weakness and decreased activity tolerance. She is disoriented to time and situation, able to follow most 1 step commands with increased time but demonstrating poor attention to task and difficulty sequencing. Today she requires max assist to wash her face, and min assist to drink (but was coughing after drinking and RN/SLP notified).  Requires total assist +2 to reposition in bed. Believe she will benefit from continued OT services while admitted to optimize return to independence with self feeding.  Will follow acutely.        Recommendations for follow up therapy are one component of a multi-disciplinary discharge planning process, led by the attending physician.  Recommendations may be updated based on patient status, additional functional criteria and insurance authorization.   Follow Up Recommendations  SNF;Supervision/Assistance - 24 hour (return to [facility])    Equipment Recommendations  None recommended by OT    Recommendations for Other Services       Precautions / Restrictions Precautions Precautions: Fall Restrictions Weight Bearing Restrictions: No      Mobility Bed Mobility               General bed mobility comments: total assist to reposition in bed +2    Transfers                      Balance                                           ADL either performed or assessed with clinical  judgement   ADL Overall ADL's : Needs assistance/impaired Eating/Feeding: Minimal assistance Eating/Feeding Details (indicate cue type and reason): pt with breakfast present, setup and used R hand to reach for drink (nectar thick) with increased time, but began coughing after drinking small sip.  Simulated hand to mouth tasks, and pt unable to sustain attention to complete mulitple step command. Grooming: Maximal assistance;Bed level;Wash/dry face Grooming Details (indicate cue type and reason): pt able to intiate but unable to sustain attention to complete, in combination with weakness and requiring support to maintain flexion of UE                             Functional mobility during ADLs: Total assistance;+2 for physical assistance (to reposition in bed)       Vision   Additional Comments: no apparent deficits, pt able to locate items on tray     Perception     Praxis      Pertinent Vitals/Pain Pain Assessment: No/denies pain     Hand Dominance Right   Extremity/Trunk Assessment Upper Extremity Assessment Upper Extremity Assessment: Generalized weakness           Communication Communication Communication: No difficulties   Cognition Arousal/Alertness: Lethargic Behavior During Therapy: Flat affect Overall Cognitive Status: No family/caregiver present to determine baseline  cognitive functioning Area of Impairment: Orientation;Attention;Following commands;Problem solving;Awareness                 Orientation Level: Disoriented to;Time;Situation Current Attention Level: Focused   Following Commands: Follows one step commands consistently;Follows one step commands inconsistently   Awareness: Intellectual Problem Solving: Slow processing;Decreased initiation;Difficulty sequencing;Requires verbal cues General Comments: pt lethargic initally but improved with repositioning and envirnmental setup, oriented to self and place (reports hospital), able to  follow most simple 1 step commands but unable to attend to or complete task.   General Comments  informed RN and SLP about coughing after drinking, recommend full supervision for meals at this time.    Exercises     Shoulder Instructions      Home Living Family/patient expects to be discharged to:: Skilled nursing facility                                 Additional Comments: [place]- long term resident      Prior Functioning/Environment Level of Independence: Needs assistance  Gait / Transfers Assistance Needed: hoyer lift transfer out of bed ADL's / Homemaking Assistance Needed: requires assist with ADL's, but reports self feeds and grooms            OT Problem List: Decreased strength;Decreased cognition;Decreased safety awareness;Decreased activity tolerance      OT Treatment/Interventions: Self-care/ADL training;Therapeutic activities;Therapeutic exercise;Cognitive remediation/compensation    OT Goals(Current goals can be found in the care plan section) Acute Rehab OT Goals Patient Stated Goal: none stated Time For Goal Achievement: 02/21/21 Potential to Achieve Goals: Good  OT Frequency: Min 2X/week   Barriers to D/C:            Co-evaluation              AM-PAC OT "6 Clicks" Daily Activity     Outcome Measure Help from another person eating meals?: A Lot Help from another person taking care of personal grooming?: A Lot Help from another person toileting, which includes using toliet, bedpan, or urinal?: Total Help from another person bathing (including washing, rinsing, drying)?: Total Help from another person to put on and taking off regular upper body clothing?: Total Help from another person to put on and taking off regular lower body clothing?: Total 6 Click Score: 8   End of Session Equipment Utilized During Treatment: Oxygen (3L) Nurse Communication: Mobility status;Other (comment) (coughing after drinking)  Activity  Tolerance: Patient tolerated treatment well Patient left: in bed;with call bell/phone within reach;with bed alarm set  OT Visit Diagnosis: Other symptoms and signs involving cognitive function;Muscle weakness (generalized) (M62.81)                Time: 8916-9450 OT Time Calculation (min): 15 min Charges:  OT General Charges $OT Visit: 1 Visit OT Evaluation $OT Eval Moderate Complexity: 1 Mod  Barry Brunner, OT Acute Rehabilitation Services Pager [phone] Office [phone]   [name] 02/07/2021, 9:56 AM

## 2021-02-08 ENCOUNTER — Inpatient Hospital Stay (HOSPITAL_COMMUNITY): Payer: Medicare (Managed Care)

## 2021-02-08 DIAGNOSIS — R1310 Dysphagia, unspecified: Secondary | ICD-10-CM

## 2021-02-08 DIAGNOSIS — R1319 Other dysphagia: Secondary | ICD-10-CM

## 2021-02-08 LAB — CBC
HCT: 50.6 % — ABNORMAL HIGH (ref 36.0–46.0)
Hemoglobin: 14.4 g/dL (ref 12.0–15.0)
MCH: 24.9 pg — ABNORMAL LOW (ref 26.0–34.0)
MCHC: 28.5 g/dL — ABNORMAL LOW (ref 30.0–36.0)
MCV: 87.5 fL (ref 80.0–100.0)
Platelets: 207 10*3/uL (ref 150–400)
RBC: 5.78 MIL/uL — ABNORMAL HIGH (ref 3.87–5.11)
RDW: 15.6 % — ABNORMAL HIGH (ref 11.5–15.5)
WBC: 15.5 10*3/uL — ABNORMAL HIGH (ref 4.0–10.5)
nRBC: 0.3 % — ABNORMAL HIGH (ref 0.0–0.2)

## 2021-02-08 LAB — CULTURE, BLOOD (ROUTINE X 2)
Culture: NO GROWTH
Special Requests: ADEQUATE

## 2021-02-08 LAB — GLUCOSE, CAPILLARY
Glucose-Capillary: 77 mg/dL (ref 70–99)
Glucose-Capillary: 71 mg/dL (ref 70–99)
Glucose-Capillary: 141 mg/dL — ABNORMAL HIGH (ref 70–99)
Glucose-Capillary: 431 mg/dL — ABNORMAL HIGH (ref 70–99)
Glucose-Capillary: 574 mg/dL (ref 70–99)
Glucose-Capillary: 463 mg/dL — ABNORMAL HIGH (ref 70–99)
Glucose-Capillary: 461 mg/dL — ABNORMAL HIGH (ref 70–99)

## 2021-02-08 LAB — BASIC METABOLIC PANEL
Anion gap: 8 (ref 5–15)
BUN: 28 mg/dL — ABNORMAL HIGH (ref 8–23)
CO2: 33 mmol/L — ABNORMAL HIGH (ref 22–32)
Calcium: 9.8 mg/dL (ref 8.9–10.3)
Chloride: 100 mmol/L (ref 98–111)
Creatinine, Ser: 0.75 mg/dL (ref 0.44–1.00)
GFR, Estimated: >60 mL/min (ref >60)
Glucose, Bld: 198 mg/dL — ABNORMAL HIGH (ref 70–99)
Potassium: 5 mmol/L (ref 3.5–5.1)
Sodium: 141 mmol/L (ref 135–145)

## 2021-02-08 LAB — METHYLMALONIC ACID, SERUM: Methylmalonic Acid, Quantitative: 160 nmol/L (ref 0–378)

## 2021-02-08 MED ORDER — INSULIN ASPART 100 UNIT/ML IJ SOLN
4.0000 [IU] | Freq: Three times a day (TID) | INTRAMUSCULAR | Status: DC
  Administered 2021-02-08 – 2021-02-13 (×13): 4 [IU] via SUBCUTANEOUS

## 2021-02-08 MED ORDER — INSULIN ASPART 100 UNIT/ML IJ SOLN
0.0000 [IU] | Freq: Three times a day (TID) | INTRAMUSCULAR | Status: DC
  Administered 2021-02-08: 15 [IU] via SUBCUTANEOUS
  Administered 2021-02-09: 5 [IU] via SUBCUTANEOUS
  Administered 2021-02-10: 11 [IU] via SUBCUTANEOUS
  Administered 2021-02-10 (×2): 3 [IU] via SUBCUTANEOUS
  Administered 2021-02-11: 11 [IU] via SUBCUTANEOUS
  Administered 2021-02-11: 5 [IU] via SUBCUTANEOUS
  Administered 2021-02-11: 15 [IU] via SUBCUTANEOUS
  Administered 2021-02-12: 3 [IU] via SUBCUTANEOUS
  Administered 2021-02-12: 5 [IU] via SUBCUTANEOUS
  Administered 2021-02-12 – 2021-02-13 (×2): 3 [IU] via SUBCUTANEOUS

## 2021-02-08 MED ORDER — CEFTRIAXONE SODIUM 2 G IJ SOLR
2.0000 g | INTRAMUSCULAR | Status: AC
  Administered 2021-02-08: 2 g via INTRAVENOUS
  Filled 2021-02-08: qty 20

## 2021-02-08 MED ORDER — INSULIN GLARGINE-YFGN 100 UNIT/ML ~~LOC~~ SOLN
25.0000 [IU] | Freq: Every day | SUBCUTANEOUS | Status: DC
  Administered 2021-02-09 – 2021-02-13 (×5): 25 [IU] via SUBCUTANEOUS
  Filled 2021-02-08 (×5): qty 0.25

## 2021-02-08 MED ORDER — INSULIN GLARGINE-YFGN 100 UNIT/ML ~~LOC~~ SOLN
20.0000 [IU] | Freq: Every day | SUBCUTANEOUS | Status: DC
  Administered 2021-02-08: 20 [IU] via SUBCUTANEOUS
  Filled 2021-02-08: qty 0.2

## 2021-02-08 MED ORDER — FUROSEMIDE 10 MG/ML IJ SOLN
20.0000 mg | Freq: Once | INTRAMUSCULAR | Status: AC
  Administered 2021-02-08: 20 mg via INTRAVENOUS
  Filled 2021-02-08: qty 2

## 2021-02-08 MED ORDER — SODIUM ZIRCONIUM CYCLOSILICATE 10 G PO PACK
10.0000 g | PACK | Freq: Once | ORAL | Status: AC
  Administered 2021-02-08: 10 g via ORAL
  Filled 2021-02-08: qty 1

## 2021-02-08 MED ORDER — ENOXAPARIN SODIUM 40 MG/0.4ML IJ SOSY
40.0000 mg | PREFILLED_SYRINGE | INTRAMUSCULAR | Status: DC
  Administered 2021-02-08: 40 mg via SUBCUTANEOUS
  Filled 2021-02-08: qty 0.4

## 2021-02-08 MED ORDER — INSULIN GLARGINE-YFGN 100 UNIT/ML ~~LOC~~ SOLN: 25.0000 [IU] | Freq: Every day | SUBCUTANEOUS | Status: DC

## 2021-02-08 MED ORDER — INSULIN ASPART 100 UNIT/ML IJ SOLN
12.0000 [IU] | Freq: Once | INTRAMUSCULAR | Status: AC
  Administered 2021-02-08: 12 [IU] via SUBCUTANEOUS

## 2021-02-08 MED ORDER — INSULIN ASPART 100 UNIT/ML IJ SOLN
15.0000 [IU] | Freq: Once | INTRAMUSCULAR | Status: AC
  Administered 2021-02-08: 15 [IU] via SUBCUTANEOUS

## 2021-02-08 MED ORDER — GUAIFENESIN-DM 100-10 MG/5ML PO SYRP
5.0000 mL | ORAL_SOLUTION | Freq: Two times a day (BID) | ORAL | Status: DC
  Administered 2021-02-08 – 2021-02-09 (×4): 5 mL via ORAL
  Filled 2021-02-08 (×4): qty 5

## 2021-02-08 MED ORDER — INSULIN GLARGINE-YFGN 100 UNIT/ML ~~LOC~~ SOLN
5.0000 [IU] | Freq: Once | SUBCUTANEOUS | Status: AC
  Administered 2021-02-08: 5 [IU] via SUBCUTANEOUS
  Filled 2021-02-08: qty 0.05

## 2021-02-08 NOTE — Progress Notes (Signed)
PROGRESS NOTE    Courtney [name]  VOZ:[id number] DOB: 12/04/1945 DOA: 02/03/2021 PCP: Jordan Hawks, PA-C    Chief Complaint  Patient presents with   Altered Mental Status   Shortness of Breath    Brief Narrative:   Courtney [name] is a 75 y.o. female with medical history significant for COPD, type 2 diabetes mellitus, hypertension, hyperlipidemia, chronic diastolic heart failure, who is admitted to [facility] on 02/03/2021 with acute hypoxic respiratory distress after presenting from snf to [facility] ED for evaluation of increased somnolence.  ED work-up significant for temperature of 99.3, oxygen saturation 88% on room air, chest x-ray significant for vascular congestion, she is with diffuse wheezing and diminished air entry, admitted for further management. -He was treated with steroids with significant improvement of her COPD, work-up significant for CAP, she had dysphagia, with work-up significant for esophageal stricture, plan for EGD per GI.  Assessment & Plan:   Principal Problem:   Acute respiratory failure with hypoxia (HCC) Active Problems:   Hypertension   Acute exacerbation of chronic obstructive pulmonary disease (COPD) (HCC)   Acute on chronic diastolic CHF (congestive heart failure) (HCC)   Severe sepsis (HCC)   CAP (community acquired pneumonia)   Acute metabolic encephalopathy   DM2 (diabetes mellitus, type 2) (HCC)    Acute hypoxic respiratory failure due to COPD exacerbation/Pneumonia: -Patient's with significantly diminished air entry bilaterally, and significant wheezing, she is with known history of COPD, wheezing continues to improve, adding IV steroids initially, she is currently off steroids.. - treat with IV Rocephin and azithromycin for 5 days. -Chest x-ray 10/8 with left lower opacity, some pleural effusion and volume overload. -Patient remains with significant upper airway congestion, she was encouraged with incentive spirometry and flutter valve,  started on Mucinex as well.    Acute on chronic diastolic heart failure: - In the context of a documented history of chronic diastolic heart failure, with most recent echocardiogram from June 2021 notable for grade 2 diastolic dysfunction with LVEF 60 to 65%, -BNP is elevated at 725. -Recurrent evidence of pulmonary edema, pleural effusion with imaging, but diuresis is limited due to hypernatremia.  Dysphagia -Significant dysphagia and coughing while eating despite being on nectar thick, esophageal gram was obtained after discussion with SLP, significant for esophageal stricture, consulted, likely will need endoscopy with dilation, timing per GI, meanwhile I will discontinue Lovenox for DVT prophylaxis and keep her n.p.o. for possible procedure tomorrow.  Hypernatremia -Proved with D5W, will continue at the lower rate today as she will be n.p.o. after midnight.  MRSA UTI - urine culture difficult for MRSA, patient endorses dysuria, she remains confused despite treating her pneumonia, will treat her UTI with 3 days of Zyvox.   Acute metabolic encephalopathy:  -Due to above, but she is more awake and alert today but remains somnolent . -TSH is low at 2, but this is likely sick euthyroid syndrome, free T4 mildly elevated at 1.8. -As well patient with new dysphagia, she is on thickened liquid, I have obtained CT head which was with no acute findings.   Type 2 diabetes mellitus:  - Documented history of such, with most recent hemoglobin A1c noted to be 7.8% when checked in June 2021.  -CBG very difficult to control, especially she was on D5W and steroids, now she is off steroids, and her D5W has decreased, I will decrease her glargine to 20 units, and continue with insulin sliding scale and insulin before meals meanwhile.  Essential hypertension:  -Very tenuous, remains elevated on home medication include Norvasc and Toprol-XL, so hydralazine was added, blood pressure is low this morning, so  I have discontinued her hydralazine.     DVT prophylaxis: Heparin Code Status: DNR Family Communication: D/W husband by phone, 10/9,10/11 Disposition:   Status is: Inpatient  Remains inpatient appropriate because:IV treatments appropriate due to intensity of illness or inability to take PO  Dispo: The patient is from: Home              Anticipated d/c is to: SNF              Patient currently is not medically stable to d/c.   Difficult to place patient No       Consultants:  GI  Subjective:  Patient herself denies any complaints, but she remains with significant dysphagia with meals, had poor breakfast this morning. Objective: Vitals:   02/07/21 2311 02/08/21 0000 02/08/21 0800 02/08/21 1126  BP: (!) 153/74 (!) 125/57 119/74 128/85  Pulse:  71 71 64  Resp: 15 19 20 20   Temp: 98 F (36.7 C)  98.7 F (37.1 C) 98.6 F (37 C)  TempSrc: Oral  Oral Oral  SpO2: 100% 92%  93%  Weight:      Height:        Intake/Output Summary (Last 24 hours) at 02/08/2021 1529 Last data filed at 02/07/2021 2321 Gross per 24 hour  Intake 0 ml  Output --  Net 0 ml   Filed Weights   02/03/21 2240 02/04/21 1429 02/07/21 0500  Weight: 107.5 kg 102.7 kg 104.8 kg    Examination:  Awake Alert, confused, demented, but pleasant Symmetrical Chest wall movement, diminished entry at the bases RRR,No Gallops,Rubs or new Murmurs, No Parasternal Heave +ve B.Sounds, Abd Soft, No tenderness, No rebound - guarding or rigidity. No Cyanosis, Clubbing or edema, No new Rash or bruise      Data Reviewed: I have personally reviewed following labs and imaging studies  CBC: Recent Labs  Lab 02/03/21 2212 02/04/21 0400 02/04/21 0411 02/05/21 0207 02/06/21 0204 02/07/21 0548 02/08/21 0151  WBC 12.2* 13.7*  --  12.3* 12.4* 17.4* 15.5*  NEUTROABS 9.9* 12.7*  --   --   --   --   --   HGB 14.6 14.8 16.3* 13.6 14.2 14.4 14.4  HCT 51.4* 55.2* 48.0* 48.1* 49.9* 50.1* 50.6*  MCV 89.5 94.0  --   88.6 88.6 85.8 87.5  PLT 197 134*  --  210 230 212 207    Basic Metabolic Panel: Recent Labs  Lab 02/04/21 0400 02/04/21 0411 02/04/21 1020 02/05/21 0207 02/05/21 1405 02/05/21 1820 02/06/21 0204 02/07/21 0148 02/08/21 0151  NA 146* 147*  --  147*  --   --  150* 152* 141  K 5.6* 5.2*  --  5.7* >7.5* 5.6* 4.3 4.7 5.0  CL 106  --   --  109  --   --  106 112* 100  CO2 26  --   --  28  --   --  35* 31 33*  GLUCOSE 174*  --   --  252*  --   --  327* 280* 198*  BUN 37*  --   --  38*  --   --  36* 35* 28*  CREATININE 0.92  --   --  0.77  --   --  0.84 0.87 0.75  CALCIUM 10.1  --   --  10.1  --   --  10.4* 9.8 9.8  MG 2.3  --  2.3  --   --   --   --   --   --   PHOS 3.1  --   --   --   --   --   --   --   --     GFR: Estimated Creatinine Clearance: 68.9 mL/min (by C-G formula based on SCr of 0.75 mg/dL).  Liver Function Tests: Recent Labs  Lab 02/03/21 2212 02/04/21 0400  AST 31 33  ALT 58* 59*  ALKPHOS 142* 138*  BILITOT 0.4 0.6  PROT 7.2 7.4  ALBUMIN 3.1* 3.1*    CBG: Recent Labs  Lab 02/07/21 1627 02/07/21 2339 02/08/21 0542 02/08/21 0721 02/08/21 1124  GLUCAP 456*  455* 278* 77 71 141*     Recent Results (from the past 240 hour(s))  Blood Culture (routine x 2)     Status: None   Collection Time: 02/03/21 10:12 PM   Specimen: BLOOD RIGHT HAND  Result Value Ref Range Status   Specimen Description BLOOD RIGHT HAND  Final   Special Requests   Final    BOTTLES DRAWN AEROBIC AND ANAEROBIC Blood Culture adequate volume   Culture   Final    NO GROWTH 5 DAYS Performed at [facility], [address], [place], [place] [address]    Report Status 02/08/2021 FINAL  Final  Blood Culture (routine x 2)     Status: Abnormal   Collection Time: 02/03/21 10:24 PM   Specimen: BLOOD  Result Value Ref Range Status   Specimen Description BLOOD BLOOD RIGHT FOREARM  Final   Special Requests   Final    BOTTLES DRAWN AEROBIC AND ANAEROBIC Blood Culture results may  not be optimal due to an inadequate volume of blood received in culture bottles   Culture  Setup Time   Final    GRAM POSITIVE COCCI IN CLUSTERS ANAEROBIC BOTTLE ONLY CRITICAL RESULT CALLED TO, READ BACK BY AND VERIFIED WITH: PHARMD JAMES LEDFORD 02/04/21@21 :46 BY TW    Culture (A)  Final    STAPHYLOCOCCUS HOMINIS THE SIGNIFICANCE OF ISOLATING THIS ORGANISM FROM A SINGLE SET OF BLOOD CULTURES WHEN MULTIPLE SETS ARE DRAWN IS UNCERTAIN. PLEASE NOTIFY THE MICROBIOLOGY DEPARTMENT WITHIN ONE WEEK IF SPECIATION AND SENSITIVITIES ARE REQUIRED. Performed at [facility], [address] [place], [place] [address]    Report Status 02/06/2021 FINAL  Final  Resp Panel by RT-PCR (Flu A&B, Covid) Nasopharyngeal Swab     Status: None   Collection Time: 02/03/21 10:24 PM   Specimen: Nasopharyngeal Swab; Nasopharyngeal(NP) swabs in vial transport medium  Result Value Ref Range Status   SARS Coronavirus 2 by RT PCR NEGATIVE NEGATIVE Final    Comment: (NOTE) SARS-CoV-2 target nucleic acids are NOT DETECTED.  The SARS-CoV-2 RNA is generally detectable in upper respiratory specimens during the acute phase of infection. The lowest concentration of SARS-CoV-2 viral copies this assay can detect is 138 copies/mL. A negative result does not preclude SARS-Cov-2 infection and should not be used as the sole basis for treatment or other patient management decisions. A negative result may occur with  improper specimen collection/handling, submission of specimen other than nasopharyngeal swab, presence of viral mutation(s) within the areas targeted by this assay, and inadequate number of viral copies(<138 copies/mL). A negative result must be combined with clinical observations, patient history, and epidemiological information. The expected result is Negative.  Fact Sheet for Patients:  BloggerCourse.com  Fact Sheet  for Healthcare Providers:   SeriousBroker.it  This test is no t yet approved or cleared by the [place] and  has been authorized for detection and/or diagnosis of SARS-CoV-2 by FDA under an Emergency Use Authorization (EUA). This EUA will remain  in effect (meaning this test can be used) for the duration of the COVID-19 declaration under Section 564(b)(1) of the Act, 21 U.S.C.section 360bbb-3(b)(1), unless the authorization is terminated  or revoked sooner.       Influenza A by PCR NEGATIVE NEGATIVE Final   Influenza B by PCR NEGATIVE NEGATIVE Final    Comment: (NOTE) The Xpert Xpress SARS-CoV-2/FLU/RSV plus assay is intended as an aid in the diagnosis of influenza from Nasopharyngeal swab specimens and should not be used as a sole basis for treatment. Nasal washings and aspirates are unacceptable for Xpert Xpress SARS-CoV-2/FLU/RSV testing.  Fact Sheet for Patients: BloggerCourse.com  Fact Sheet for Healthcare Providers: SeriousBroker.it  This test is not yet approved or cleared by the [place] FDA and has been authorized for detection and/or diagnosis of SARS-CoV-2 by FDA under an Emergency Use Authorization (EUA). This EUA will remain in effect (meaning this test can be used) for the duration of the COVID-19 declaration under Section 564(b)(1) of the Act, 21 U.S.C. section 360bbb-3(b)(1), unless the authorization is terminated or revoked.  Performed at [facility], [address] [place], [place] [address]   Blood Culture ID Panel (Reflexed)     Status: Abnormal   Collection Time: 02/03/21 10:24 PM  Result Value Ref Range Status   Enterococcus faecalis NOT DETECTED NOT DETECTED Final   Enterococcus Faecium NOT DETECTED NOT DETECTED Final   Listeria monocytogenes NOT DETECTED NOT DETECTED Final   Staphylococcus species DETECTED (A) NOT DETECTED Final    Comment: CRITICAL RESULT CALLED TO, READ BACK BY  AND VERIFIED WITH: PHARMD JAMES LEDFORD 02/04/21@21 :46 BY TW    Staphylococcus aureus (BCID) NOT DETECTED NOT DETECTED Final   Staphylococcus epidermidis NOT DETECTED NOT DETECTED Final   Staphylococcus lugdunensis NOT DETECTED NOT DETECTED Final   Streptococcus species NOT DETECTED NOT DETECTED Final   Streptococcus agalactiae NOT DETECTED NOT DETECTED Final   Streptococcus pneumoniae NOT DETECTED NOT DETECTED Final   Streptococcus pyogenes NOT DETECTED NOT DETECTED Final   A.calcoaceticus-baumannii NOT DETECTED NOT DETECTED Final   Bacteroides fragilis NOT DETECTED NOT DETECTED Final   Enterobacterales NOT DETECTED NOT DETECTED Final   Enterobacter cloacae complex NOT DETECTED NOT DETECTED Final   Escherichia coli NOT DETECTED NOT DETECTED Final   Klebsiella aerogenes NOT DETECTED NOT DETECTED Final   Klebsiella oxytoca NOT DETECTED NOT DETECTED Final   Klebsiella pneumoniae NOT DETECTED NOT DETECTED Final   Proteus species NOT DETECTED NOT DETECTED Final   Salmonella species NOT DETECTED NOT DETECTED Final   Serratia marcescens NOT DETECTED NOT DETECTED Final   Haemophilus influenzae NOT DETECTED NOT DETECTED Final   Neisseria meningitidis NOT DETECTED NOT DETECTED Final   Pseudomonas aeruginosa NOT DETECTED NOT DETECTED Final   Stenotrophomonas maltophilia NOT DETECTED NOT DETECTED Final   Candida albicans NOT DETECTED NOT DETECTED Final   Candida auris NOT DETECTED NOT DETECTED Final   Candida glabrata NOT DETECTED NOT DETECTED Final   Candida krusei NOT DETECTED NOT DETECTED Final   Candida parapsilosis NOT DETECTED NOT DETECTED Final   Candida tropicalis NOT DETECTED NOT DETECTED Final   Cryptococcus neoformans/gattii NOT DETECTED NOT DETECTED Final    Comment: Performed at [facility], [address], [place], [place] [address]  Urine Culture     Status: Abnormal   Collection Time: 02/04/21  6:30 AM   Specimen: In/Out Cath Urine  Result Value Ref Range Status    Specimen Description IN/OUT CATH URINE  Final   Special Requests   Final    NONE Performed at [facility], [address] [place], [place] [address]    Culture (A)  Final    40,000 COLONIES/mL METHICILLIN RESISTANT STAPHYLOCOCCUS AUREUS   Report Status 02/06/2021 FINAL  Final   Organism ID, Bacteria METHICILLIN RESISTANT STAPHYLOCOCCUS AUREUS (A)  Final      Susceptibility   Methicillin resistant staphylococcus aureus - MIC*    CIPROFLOXACIN >=8 RESISTANT Resistant     GENTAMICIN <=0.5 SENSITIVE Sensitive     NITROFURANTOIN <=16 SENSITIVE Sensitive     OXACILLIN >=4 RESISTANT Resistant     TETRACYCLINE <=1 SENSITIVE Sensitive     VANCOMYCIN 1 SENSITIVE Sensitive     TRIMETH/SULFA >=320 RESISTANT Resistant     CLINDAMYCIN <=0.25 SENSITIVE Sensitive     RIFAMPIN <=0.5 SENSITIVE Sensitive     Inducible Clindamycin NEGATIVE Sensitive     * 40,000 COLONIES/mL METHICILLIN RESISTANT STAPHYLOCOCCUS AUREUS         Radiology Studies: CT HEAD WO CONTRAST ( )  Result Date: 02/07/2021 CLINICAL DATA:  Mental status change EXAM: CT HEAD WITHOUT CONTRAST TECHNIQUE: Contiguous axial images were obtained from the base of the skull through the vertex without intravenous contrast. COMPARISON:  11/01/2018 FINDINGS: Brain: No evidence of acute infarction, hemorrhage, cerebral edema, mass, mass effect, or midline shift. Periventricular white matter changes, likely the sequela of chronic small vessel ischemic disease. No extra-axial fluid collection. Vascular: No hyperdense vessel or unexpected calcification. Skull: Normal. Negative for fracture or focal lesion. Sinuses/Orbits: No acute finding. Other: The mastoid air cells are well aerated. IMPRESSION: No acute intracranial process. Electronically Signed   By: Wiliam Ke M.D.   On: 02/07/2021 11:37   DG Swallowing Func-Speech Pathology  Result Date: 02/07/2021 Table formatting from the original result was not included. Objective Swallowing  Evaluation: Type of Study: MBS-Modified Barium Swallow Study  Patient Details Name: Courtney [name] MRN: [id number] Date of Birth: 10/06/1945 Today's Date: 02/07/2021 Time: SLP Start Time (ACUTE ONLY): 1230 -SLP Stop Time (ACUTE ONLY): 1250 SLP Time Calculation (min) (ACUTE ONLY): 20 min Past Medical History: Past Medical History: Diagnosis Date  Anemia   Arthritis   Asthma   CAD (coronary artery disease)   COPD (chronic obstructive pulmonary disease) (HCC)   Diabetes mellitus   Hyperlipidemia   Hypertension   Myocardial infarct (HCC)   Postmenopausal atrophic vaginitis   Restless leg syndrome  Past Surgical History: Past Surgical History: Procedure Laterality Date  CHOLECYSTECTOMY  1970's  INCISION AND DRAINAGE ABSCESS Right 06/27/2018  Procedure: 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.;  Surgeon: Romie Levee, MD;  Location: [facility];  Service: General;  Laterality: Right;  INCISION AND DRAINAGE PERIRECTAL ABSCESS N/A 06/25/2018  Procedure: IRRIGATION AND DEBRIDEMENT PERIRECTAL ABCESS;  Surgeon: Romie Levee, MD;  Location: [facility];  Service: General;  Laterality: N/A;  IRRIGATION AND DEBRIDEMENT ABSCESS N/A 07/01/2018  Procedure: IRRIGATION AND DEBRIDEMENT ABSCESS;  Surgeon: Emelia Loron, MD;  Location: [facility];  Service: General;  Laterality: N/A;  OTHER SURGICAL HISTORY  1970's  hysterectomy  WOUND DEBRIDEMENT N/A 07/03/2018  Procedure: DEBRIDEMENT PELVIS WITH DRESSING CHANGE;  Surgeon: Emelia Loron, MD;  Location: [facility];  Service: General;  Laterality: N/A; HPI: Katina Dung  Brisby is a 75 y.o. female with medical history significant for COPD, type 2 diabetes mellitus, hypertension, hyperlipidemia, chronic diastolic heart failure, who is admitted to [facility] on 02/03/2021 with acute hypoxic respiratory distress after presenting from snf to [facility] ED for evaluation of increased somnolence.  ED work-up significant for temperature of 99.3, oxygen saturation 88% on room air, chest x-ray  significant for vascular congestion, she is with diffuse wheezing and diminished air entry, admitted for further management.Pt coughing after [place].  No data recorded Assessment / Plan / Recommendation CHL IP CLINICAL IMPRESSIONS 02/07/2021 Clinical Impression Pt demonstrates a primairly oral dysphagia with lingual rocking of bolus and decreased bolus cohesion with spillage of the head of bolus with tail remaining in oral cavity. There are instances of aspiration before the swallow with thin and also after as pt transits the remaining bolus to an open airway. Sensation is inconsistent and often delayed. Pt also observed to have backflow of liquids from esophagus to pharynx with belching and esophageal sweep shows moderate stasis in cervical and thoracic esophageal column. Pt has a moderate risk of postprandial aspiration. Recommend ongoing use of nectar thick liquids and pureed solids with esophageal precautions, primairly slow rate and upright positioning and sustaining upright position after meals. Will follow for potential for uprgrade which may be possible if mentation improves as pt was still weak and inattentive today. SLP Visit Diagnosis Dysphagia, oral phase (R13.11) Attention and concentration deficit following -- Frontal lobe and executive function deficit following -- Impact on safety and function Mild aspiration risk;Risk for inadequate nutrition/hydration   CHL IP TREATMENT RECOMMENDATION 02/07/2021 Treatment Recommendations Therapy as outlined in treatment plan below   Prognosis 02/07/2021 Prognosis for Safe Diet Advancement Fair Barriers to Reach Goals Cognitive deficits Barriers/Prognosis Comment -- CHL IP DIET RECOMMENDATION 02/07/2021 SLP Diet Recommendations Dysphagia 1 (Puree) solids;Nectar thick liquid Liquid Administration via Cup;Straw Medication Administration Crushed with puree Compensations Slow rate;Small sips/bites Postural Changes Seated upright at 90 degrees;Remain semi-upright after  after feeds/meals (Comment)   No flowsheet data found.  CHL IP FOLLOW UP RECOMMENDATIONS 02/07/2021 Follow up Recommendations Skilled Nursing facility   [facility] IP FREQUENCY AND DURATION 02/07/2021 Speech Therapy Frequency (ACUTE ONLY) min 2x/week Treatment Duration 2 weeks      CHL IP ORAL PHASE 02/07/2021 Oral Phase Impaired Oral - Pudding Teaspoon -- Oral - Pudding Cup -- Oral - Honey Teaspoon -- Oral - Honey Cup -- Oral - Nectar Teaspoon -- Oral - Nectar Cup -- Oral - Nectar Straw Decreased bolus cohesion;Delayed oral transit;Premature spillage Oral - Thin Teaspoon -- Oral - Thin Cup -- Oral - Thin Straw Premature spillage;Decreased bolus cohesion;Delayed oral transit;Lingual pumping Oral - Puree Lingual pumping;Delayed oral transit;Decreased bolus cohesion;Premature spillage Oral - Mech Soft Premature spillage;Decreased bolus cohesion;Delayed oral transit;Lingual pumping Oral - Regular -- Oral - Multi-Consistency -- Oral - Pill -- Oral Phase - Comment --  CHL IP PHARYNGEAL PHASE 02/07/2021 Pharyngeal Phase Impaired Pharyngeal- Pudding Teaspoon -- Pharyngeal -- Pharyngeal- Pudding Cup -- Pharyngeal -- Pharyngeal- Honey Teaspoon -- Pharyngeal -- Pharyngeal- Honey Cup -- Pharyngeal -- Pharyngeal- Nectar Teaspoon -- Pharyngeal -- Pharyngeal- Nectar Cup -- Pharyngeal -- Pharyngeal- Nectar Straw Penetration/Aspiration before swallow Pharyngeal Material enters airway, remains ABOVE vocal cords then ejected out;Material does not enter airway Pharyngeal- Thin Teaspoon -- Pharyngeal -- Pharyngeal- Thin Cup -- Pharyngeal -- Pharyngeal- Thin Straw Penetration/Aspiration before swallow;Penetration/Apiration after swallow;Delayed swallow initiation-pyriform sinuses Pharyngeal Material enters airway, passes BELOW cords without attempt by patient to eject out (silent aspiration);Material  does not enter airway Pharyngeal- Puree Delayed swallow initiation-vallecula Pharyngeal -- Pharyngeal- Mechanical Soft Delayed swallow  initiation-vallecula Pharyngeal -- Pharyngeal- Regular -- Pharyngeal -- Pharyngeal- Multi-consistency -- Pharyngeal -- Pharyngeal- Pill -- Pharyngeal -- Pharyngeal Comment --  CHL IP CERVICAL ESOPHAGEAL PHASE 02/07/2021 Cervical Esophageal Phase Impaired Pudding Teaspoon -- Pudding Cup -- Honey Teaspoon -- Honey Cup -- Nectar Teaspoon -- Nectar Cup -- Nectar Straw Esophageal backflow into the pharynx Thin Teaspoon -- Thin Cup -- Thin Straw -- Puree -- Mechanical Soft -- Regular -- Multi-consistency -- Pill -- Cervical Esophageal Comment -- [place], Riley Nearing 02/07/2021, 1:43 PM              DG ESOPHAGUS W SINGLE CM (SOL OR THIN BA)  Result Date: 02/08/2021 CLINICAL DATA:  Dysphagia EXAM: ESOPHOGRAM/BARIUM SWALLOW TECHNIQUE: Single contrast examination was performed using  thin barium. FLUOROSCOPY TIME:  Fluoroscopy Time:  2 minute Radiation Exposure Index (if provided by the fluoroscopic device): 47.8 mGy Number of Acquired Spot Images: 0 COMPARISON:  None. FINDINGS: Normal pharyngeal anatomy and motility. Contrast flowed freely through the esophagus without evidence of a mass. Relative narrowing of the distal esophagus just proximal to the esophagogastric junction concerning for a stricture. Normal esophageal mucosa without evidence of irregularity or ulceration. Tertiary contractions of the esophagus as can be seen with esophageal spasm. No evidence of reflux. No definite hiatal hernia was demonstrated. IMPRESSION: Relative narrowing of the distal esophagus just proximal to the esophagogastric junction concerning for a stricture. Tertiary contractions of the esophagus as can be seen with esophageal spasm. Electronically Signed   By: Elige Ko M.D.   On: 02/08/2021 15:19        Scheduled Meds:  amLODipine  10 mg Oral Daily   aspirin EC  81 mg Oral Daily   atorvastatin  80 mg Oral QHS   enoxaparin (LOVENOX) injection  40 mg Subcutaneous Q24H   fluticasone  1 spray Each Nare Daily    guaiFENesin-dextromethorphan  5 mL Oral BID   insulin aspart  0-20 Units Subcutaneous TID WC   insulin aspart  0-5 Units Subcutaneous QHS   insulin aspart  4 Units Subcutaneous TID WC   insulin glargine-yfgn  20 Units Subcutaneous Daily   linezolid  600 mg Oral Q12H   metoprolol succinate  25 mg Oral Daily   pantoprazole  40 mg Oral Daily   saccharomyces boulardii  250 mg Oral BID   senna  2 tablet Oral Daily   sertraline  50 mg Oral Daily   Continuous Infusions:  azithromycin 500 mg (02/08/21 0030)   cefTRIAXone (ROCEPHIN)  IV     dextrose 60 mL/hr at 02/07/21 1056     LOS: 5 days      Huey Bienenstock, MD [facility]   To contact the attending provider between 7A-7P or the covering provider during after hours 7P-7A, please log into the web site [email or web address] and access using universal [place] password for that web site. If you do not have the password, please call the hospital operator.  02/08/2021, 3:29 PM

## 2021-02-08 NOTE — H&P (View-Only) (Signed)
                                                                           [facility] Consult: 3:44 PM 02/08/2021  LOS: 5 days    Referring Provider: Dr Elgergawy  Primary Care Physician:  Gordon, Stephanine Reas B, PA-C Primary Gastroenterologist:  unassigned SNF pt     Reason for Consultation: Dysphagia and esophagram suggesting distal esophageal stricture.   HPI: Courtney [name] is a 75 y.o. female.  PMH.  Morbid obesity CAD.  COPD..  Asthma.  IDDM. Iron deficiency anemia.   Fournier's gangrene 2020, status post I&D.  S/p diverting colostomy to prevent contamination.  S/p cholecystectomy and hysterectomy.    Admitted to [facility] 5 days ago with hypoxic respiratory distress, increased somnolence.  Vascular congestion on CXR.  Received treatment with steroids and ruled in for CAP though she is SNF patient so HCAP.  MRSA growing from urine. Patient complains of and displaying symptoms of dysphagia.  Coughing occurs w soft foods, liquids, nectar thick liquids. This has begun since the admission.  It was not an issue before.  Does not recall prior EGD and if she has had a colonoscopy it was decades ago.  Esophagram  today reveals narrowing at distal esophagus proximal to GE junction concerning for stricture.  Tertiary esophageal contractions.  Study did not include ingestion of barium tablet.    Past Medical History:  Diagnosis Date   Anemia    Arthritis    Asthma    CAD (coronary artery disease)    COPD (chronic obstructive pulmonary disease) (HCC)    Diabetes mellitus    Hyperlipidemia    Hypertension    Myocardial infarct (HCC)    Postmenopausal atrophic vaginitis    Restless leg syndrome     Past Surgical History:  Procedure Laterality Date   CHOLECYSTECTOMY  1970's   INCISION AND DRAINAGE ABSCESS Right 06/27/2018   Procedure: INCISION AND DEBRIDMENT OF PERI-RECTAL  NECROTIZING FASCIITIS.;  Surgeon: Thomas, Alicia, MD;  Location: [facility];  Service: General;  Laterality: Right;   INCISION AND DRAINAGE PERIRECTAL ABSCESS N/A 06/25/2018   Procedure: IRRIGATION AND DEBRIDEMENT PERIRECTAL ABCESS;  Surgeon: Thomas, Alicia, MD;  Location: [facility];  Service: General;  Laterality: N/A;   IRRIGATION AND DEBRIDEMENT ABSCESS N/A 07/01/2018   Procedure: IRRIGATION AND DEBRIDEMENT ABSCESS;  Surgeon: Wakefield, Matthew, MD;  Location: [facility];  Service: General;  Laterality: N/A;   OTHER SURGICAL HISTORY  1970's   hysterectomy   WOUND DEBRIDEMENT N/A 07/03/2018   Procedure: DEBRIDEMENT PELVIS WITH DRESSING CHANGE;  Surgeon: Wakefield, Matthew, MD;  Location: [facility];  Service: General;  Laterality: N/A;    Prior to Admission medications   Medication Sig Start Date End Date Taking? Authorizing Provider  acetaminophen (TYLENOL) 650 MG CR tablet Take 650 mg by mouth every 6 (six) hours as needed for pain.   Yes [provider]  ADMELOG SOLOSTAR 100 UNIT/ML KwikPen Inject 5 Units into the skin. Before meals and at bedtime 12/28/20  Yes [provider]  amLODipine (NORVASC) 10 MG tablet Take 1 tablet (10 mg total) by mouth daily. 07/09/18  Yes Dhungel, Nishant, MD  arformoterol (BROVANA) 15 MCG/2ML NEBU   Take 2 mLs (15 mcg total) by nebulization 2 (two) times daily. 10/21/19  Yes Rhetta Mura, MD  aspirin EC 81 MG EC tablet Take 1 tablet (81 mg total) by mouth daily. 07/09/18  Yes Dhungel, Nishant, MD  atorvastatin (LIPITOR) 80 MG tablet Take 80 mg by mouth at bedtime.   Yes [provider]  budesonide (PULMICORT) 0.25 MG/2ML nebulizer solution Take 2 mLs (0.25 mg total) by nebulization 2 (two) times daily. 10/21/19  Yes Rhetta Mura, MD  D-5000 125 MCG (5000 UT) TABS Take 1 tablet by mouth daily. 09/19/19  Yes [provider]  famotidine (PEPCID) 20 MG tablet Take 20 mg by mouth 2 (two) times daily.   Yes [provider]  ferrous  sulfate 325 (65 FE) MG tablet Take 1 tablet (325 mg total) by mouth 2 (two) times daily with a meal. Patient taking differently: Take 325 mg by mouth daily with breakfast. 10/21/19  Yes Rhetta Mura, MD  fluticasone (FLONASE) 50 MCG/ACT nasal spray Place 1 spray into both nostrils daily. 09/19/19  Yes [provider]  hydrOXYzine (ATARAX/VISTARIL) 25 MG tablet Take 25 mg by mouth in the morning and at bedtime.   Yes [provider]  insulin aspart (NOVOLOG) 100 UNIT/ML injection Inject 0-16 Units into the skin See admin instructions. Inject before meals and at bedtime, per sliding scale: 0-150 = 0U 151-200 = 4U 201-250 = 6U 251-300 = 8U 301-350 = 10U 351-400 = 14U 401-450U = 16U   Yes [provider]  LANTUS SOLOSTAR 100 UNIT/ML Solostar Pen Inject 21 Units into the skin 2 (two) times daily. 04/16/18  Yes [provider]  melatonin 3 MG TABS tablet Take 1 tablet (3 mg total) by mouth at bedtime. 10/21/19  Yes Rhetta Mura, MD  memantine (NAMENDA) 5 MG tablet Take 5 mg by mouth at bedtime.   Yes [provider]  metFORMIN (GLUCOPHAGE) 1000 MG tablet Take 1,000 mg by mouth 2 (two) times daily with a meal. 12/19/20  Yes [provider]  metoprolol succinate (TOPROL-XL) 25 MG 24 hr tablet Take 25 mg by mouth daily.   Yes [provider]  polyethylene glycol (MIRALAX / GLYCOLAX) 17 g packet Take 17 g by mouth daily.   Yes [provider]  predniSONE (DELTASONE) 20 MG tablet Take 20 mg by mouth daily with breakfast.   Yes [provider]  saccharomyces boulardii (FLORASTOR) 250 MG capsule Take 250 mg by mouth 2 (two) times daily.   Yes [provider]  senna (SENOKOT) 8.6 MG TABS tablet Take 2 tablets by mouth daily.   Yes [provider]  sertraline (ZOLOFT) 50 MG tablet Take 50 mg by mouth daily.    Yes [provider]  tiotropium (SPIRIVA) 18 MCG inhalation capsule Place 18 mcg  into inhaler and inhale daily.   Yes [provider]  Ensure Max Protein (ENSURE MAX PROTEIN) LIQD Take 330 mLs (11 oz total) by mouth 2 (two) times daily. Patient not taking: No sig reported 07/08/18   Dhungel, Nishant, MD  ipratropium-albuterol (DUONEB) 0.5-2.5 (3) MG/3ML SOLN Take 3 mLs by nebulization every 6 (six) hours as needed. Patient not taking: No sig reported 07/08/18   Dhungel, Nishant, MD  nutrition supplement, JUVEN, (JUVEN) PACK Take 1 packet by mouth 2 (two) times daily between meals. Patient not taking: No sig reported 07/09/18   Dhungel, Nishant, MD    Scheduled Meds:  amLODipine  10 mg Oral Daily   aspirin EC  81 mg Oral Daily   atorvastatin  80 mg Oral QHS   fluticasone  1 spray Each Nare Daily   guaiFENesin-dextromethorphan  5 mL Oral BID   insulin aspart  0-20 Units Subcutaneous TID WC   insulin aspart  0-5 Units Subcutaneous QHS   insulin aspart  4 Units Subcutaneous TID WC   insulin glargine-yfgn  20 Units Subcutaneous Daily   linezolid  600 mg Oral Q12H   metoprolol succinate  25 mg Oral Daily   pantoprazole  40 mg Oral Daily   saccharomyces boulardii  250 mg Oral BID   senna  2 tablet Oral Daily   sertraline  50 mg Oral Daily   sodium zirconium cyclosilicate  10 g Oral Once   Infusions:  azithromycin 500 mg (02/08/21 0030)   cefTRIAXone (ROCEPHIN)  IV     dextrose 60 mL/hr at 02/07/21 1056   PRN Meds: acetaminophen **OR** acetaminophen, albuterol, ondansetron (ZOFRAN) IV   Allergies as of 02/03/2021 - Review Complete 02/03/2021  Allergen Reaction Noted   Penicillins  04/05/2011    Family History  Problem Relation Age of Onset   Hypertension Brother    Heart attack Neg Hx     Social History   Socioeconomic History   Marital status: Married    Spouse name: Not on file   Number of children: Not on file   Years of education: Not on file   Highest education level: Not on file  Occupational History   Not on file  Tobacco Use   Smoking  status: Every Day    Packs/day: 0.50    Types: Cigarettes   Smokeless tobacco: Never  Substance and Sexual Activity   Alcohol use: No   Drug use: No   Sexual activity: Not on file  Other Topics Concern   Not on file  Social History Narrative   Not on file   Social Determinants of Health   Financial Resource Strain: Not on file  Food Insecurity: Not on file  Transportation Needs: Not on file  Physical Activity: Not on file  Stress: Not on file  Social Connections: Not on file  Intimate Partner Violence: Not on file    REVIEW OF SYSTEMS: Constitutional: Generally deconditioned and weak but no profound fatigue. ENT:  No nose bleeds Pulm: Breathing improved.  No productive cough. CV:  No palpitations, no angina.  + Lower extremity edema is fairly chronic. GU:  No hematuria, no frequency GI: See HPI.  Ostomy is functioning well with output of soft brown stool. Heme: Denies excessive or unusual bleeding or bruising. Transfusions: None Neuro:  No headaches, no peripheral tingling or numbness.  No seizures, no syncope Derm:  No itching, no rash or sores.  Endocrine:  No sweats or chills.  No polyuria or dysuria Immunization: Reviewed. Travel:  None    PHYSICAL EXAM: Vital signs in last 24 hours: Vitals:   02/08/21 0800 02/08/21 1126  BP: 119/74 128/85  Pulse: 71 64  Resp: 20 20  Temp: 98.7 F (37.1 C) 98.6 F (37 C)  SpO2:  93%   Wt Readings from Last 3 Encounters:  02/07/21 104.8 kg  10/21/19 98.3 kg  07/03/18 102.7 kg    General: Morbidly obese, chronically unwell appearing but comfortable resting in the bed.  Husband attentive at bedside. Head: No facial asymmetry or swelling.  No signs of head trauma. Eyes: No scleral icterus.  No conjunctival pallor. Ears: Not hard of hearing Nose: No discharge or congestion Mouth: Poor  dentition.  Mucosa is moist, pink, clear.  Nonspecific white coating on the tongue, not necessarily Candida. Neck: No mass, no JVD Lungs:  No labored breathing.  No cough. Heart: RRR, rate in 70s.  No MRG.  S1, S2 present. Abdomen: Soft, obese, nondistended.  Soft brown stool in ostomy positioned at LLQ.  No tenderness.  No HSM, masses, bruits, hernias..   Rectal: Not performed Musc/Skeltl: No joint redness, swelling or significant deformities. Extremities: Nonpitting lower extremity edema. Neurologic: Alert.  Appropriate.  Oriented x3.  Moves all 4 limbs without tremor, strength not tested. Skin: On incomplete dermatologic survey, no suspicious lesions or rash. Nodes: No cervical adenopathy Psych: Pleasant, calm, engaged.  Intake/Output from previous day: No intake/output data recorded. Intake/Output this shift: No intake/output data recorded.  LAB RESULTS: Recent Labs    02/06/21 0204 02/07/21 0548 02/08/21 0151  WBC 12.4* 17.4* 15.5*  HGB 14.2 14.4 14.4  HCT 49.9* 50.1* 50.6*  PLT 230 212 207   BMET Lab Results  Component Value Date   NA 141 02/08/2021   NA 152 (H) 02/07/2021   NA 150 (H) 02/06/2021   K 5.0 02/08/2021   K 4.7 02/07/2021   K 4.3 02/06/2021   CL 100 02/08/2021   CL 112 (H) 02/07/2021   CL 106 02/06/2021   CO2 33 (H) 02/08/2021   CO2 31 02/07/2021   CO2 35 (H) 02/06/2021   GLUCOSE 198 (H) 02/08/2021   GLUCOSE 280 (H) 02/07/2021   GLUCOSE 327 (H) 02/06/2021   BUN 28 (H) 02/08/2021   BUN 35 (H) 02/07/2021   BUN 36 (H) 02/06/2021   CREATININE 0.75 02/08/2021   CREATININE 0.87 02/07/2021   CREATININE 0.84 02/06/2021   CALCIUM 9.8 02/08/2021   CALCIUM 9.8 02/07/2021   CALCIUM 10.4 (H) 02/06/2021   LFT No results for input(s): PROT, ALBUMIN, AST, ALT, ALKPHOS, BILITOT, BILIDIR, IBILI in the last 72 hours. PT/INR Lab Results  Component Value Date   INR 1.0 02/03/2021   INR 1.2 06/25/2018   INR 1.2 06/25/2018   Hepatitis Panel No results for input(s): HEPBSAG, HCVAB, HEPAIGM, HEPBIGM in the last 72 hours. C-Diff No components found for: CDIFF Lipase  No results found for:  LIPASE  Drugs of Abuse     Component Value Date/Time   LABOPIA NONE DETECTED 02/04/2021 0630   COCAINSCRNUR NONE DETECTED 02/04/2021 0630   LABBENZ NONE DETECTED 02/04/2021 0630   AMPHETMU NONE DETECTED 02/04/2021 0630   THCU NONE DETECTED 02/04/2021 0630   LABBARB NONE DETECTED 02/04/2021 0630     RADIOLOGY STUDIES: CT HEAD WO CONTRAST ( )  Result Date: 02/07/2021 CLINICAL DATA:  Mental status change EXAM: CT HEAD WITHOUT CONTRAST TECHNIQUE: Contiguous axial images were obtained from the base of the skull through the vertex without intravenous contrast. COMPARISON:  11/01/2018 FINDINGS: Brain: No evidence of acute infarction, hemorrhage, cerebral edema, mass, mass effect, or midline shift. Periventricular white matter changes, likely the sequela of chronic small vessel ischemic disease. No extra-axial fluid collection. Vascular: No hyperdense vessel or unexpected calcification. Skull: Normal. Negative for fracture or focal lesion. Sinuses/Orbits: No acute finding. Other: The mastoid air cells are well aerated. IMPRESSION: No acute intracranial process. Electronically Signed   By: Wiliam Ke M.D.   On: 02/07/2021 11:37   DG Swallowing Func-Speech Pathology  Result Date: 02/07/2021 Table formatting from the original result was not included. Objective Swallowing Evaluation: Type of Study: MBS-Modified Barium Swallow Study  Patient Details Name: ALETSE BIONDOLILLO MRN: [id number] Date of  Birth: 29-Mar-1946 Today's Date: 02/07/2021 Time: SLP Start Time (ACUTE ONLY): 1230 -SLP Stop Time (ACUTE ONLY): 1250 SLP Time Calculation (min) (ACUTE ONLY): 20 min Past Medical History: Past Medical History: Diagnosis Date  Anemia   Arthritis   Asthma   CAD (coronary artery disease)   COPD (chronic obstructive pulmonary disease) (HCC)   Diabetes mellitus   Hyperlipidemia   Hypertension   Myocardial infarct (HCC)   Postmenopausal atrophic vaginitis   Restless leg syndrome  Past Surgical History: Past Surgical  History: Procedure Laterality Date  CHOLECYSTECTOMY  1970's  INCISION AND DRAINAGE ABSCESS Right 06/27/2018  Procedure: 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.;  Surgeon: Romie Levee, MD;  Location: [facility];  Service: General;  Laterality: Right;  INCISION AND DRAINAGE PERIRECTAL ABSCESS N/A 06/25/2018  Procedure: IRRIGATION AND DEBRIDEMENT PERIRECTAL ABCESS;  Surgeon: Romie Levee, MD;  Location: [facility];  Service: General;  Laterality: N/A;  IRRIGATION AND DEBRIDEMENT ABSCESS N/A 07/01/2018  Procedure: IRRIGATION AND DEBRIDEMENT ABSCESS;  Surgeon: Emelia Loron, MD;  Location: [facility];  Service: General;  Laterality: N/A;  OTHER SURGICAL HISTORY  1970's  hysterectomy  WOUND DEBRIDEMENT N/A 07/03/2018  Procedure: DEBRIDEMENT PELVIS WITH DRESSING CHANGE;  Surgeon: Emelia Loron, MD;  Location: [facility];  Service: General;  Laterality: N/A; HPI: BECKIE VISCARDI is a 75 y.o. female with medical history significant for COPD, type 2 diabetes mellitus, hypertension, hyperlipidemia, chronic diastolic heart failure, who is admitted to [facility] on 02/03/2021 with acute hypoxic respiratory distress after presenting from snf to [facility] ED for evaluation of increased somnolence.  ED work-up significant for temperature of 99.3, oxygen saturation 88% on room air, chest x-ray significant for vascular congestion, she is with diffuse wheezing and diminished air entry, admitted for further management.Pt coughing after [place].  No data recorded Assessment / Plan / Recommendation CHL IP CLINICAL IMPRESSIONS 02/07/2021 Clinical Impression Pt demonstrates a primairly oral dysphagia with lingual rocking of bolus and decreased bolus cohesion with spillage of the head of bolus with tail remaining in oral cavity. There are instances of aspiration before the swallow with thin and also after as pt transits the remaining bolus to an open airway. Sensation is inconsistent and often delayed. Pt also observed to have  backflow of liquids from esophagus to pharynx with belching and esophageal sweep shows moderate stasis in cervical and thoracic esophageal column. Pt has a moderate risk of postprandial aspiration. Recommend ongoing use of nectar thick liquids and pureed solids with esophageal precautions, primairly slow rate and upright positioning and sustaining upright position after meals. Will follow for potential for uprgrade which may be possible if mentation improves as pt was still weak and inattentive today. SLP Visit Diagnosis Dysphagia, oral phase (R13.11) Attention and concentration deficit following -- Frontal lobe and executive function deficit following -- Impact on safety and function Mild aspiration risk;Risk for inadequate nutrition/hydration   CHL IP TREATMENT RECOMMENDATION 02/07/2021 Treatment Recommendations Therapy as outlined in treatment plan below   Prognosis 02/07/2021 Prognosis for Safe Diet Advancement Fair Barriers to Reach Goals Cognitive deficits Barriers/Prognosis Comment -- CHL IP DIET RECOMMENDATION 02/07/2021 SLP Diet Recommendations Dysphagia 1 (Puree) solids;Nectar thick liquid Liquid Administration via Cup;Straw Medication Administration Crushed with puree Compensations Slow rate;Small sips/bites Postural Changes Seated upright at 90 degrees;Remain semi-upright after after feeds/meals (Comment)   No flowsheet data found.  CHL IP FOLLOW UP RECOMMENDATIONS 02/07/2021 Follow up Recommendations Skilled Nursing facility   [facility] IP FREQUENCY AND DURATION 02/07/2021 Speech Therapy Frequency (ACUTE ONLY) min  2x/week Treatment Duration 2 weeks      CHL IP ORAL PHASE 02/07/2021 Oral Phase Impaired Oral - Pudding Teaspoon -- Oral - Pudding Cup -- Oral - Honey Teaspoon -- Oral - Honey Cup -- Oral - Nectar Teaspoon -- Oral - Nectar Cup -- Oral - Nectar Straw Decreased bolus cohesion;Delayed oral transit;Premature spillage Oral - Thin Teaspoon -- Oral - Thin Cup -- Oral - Thin Straw Premature  spillage;Decreased bolus cohesion;Delayed oral transit;Lingual pumping Oral - Puree Lingual pumping;Delayed oral transit;Decreased bolus cohesion;Premature spillage Oral - Mech Soft Premature spillage;Decreased bolus cohesion;Delayed oral transit;Lingual pumping Oral - Regular -- Oral - Multi-Consistency -- Oral - Pill -- Oral Phase - Comment --  CHL IP PHARYNGEAL PHASE 02/07/2021 Pharyngeal Phase Impaired Pharyngeal- Pudding Teaspoon -- Pharyngeal -- Pharyngeal- Pudding Cup -- Pharyngeal -- Pharyngeal- Honey Teaspoon -- Pharyngeal -- Pharyngeal- Honey Cup -- Pharyngeal -- Pharyngeal- Nectar Teaspoon -- Pharyngeal -- Pharyngeal- Nectar Cup -- Pharyngeal -- Pharyngeal- Nectar Straw Penetration/Aspiration before swallow Pharyngeal Material enters airway, remains ABOVE vocal cords then ejected out;Material does not enter airway Pharyngeal- Thin Teaspoon -- Pharyngeal -- Pharyngeal- Thin Cup -- Pharyngeal -- Pharyngeal- Thin Straw Penetration/Aspiration before swallow;Penetration/Apiration after swallow;Delayed swallow initiation-pyriform sinuses Pharyngeal Material enters airway, passes BELOW cords without attempt by patient to eject out (silent aspiration);Material does not enter airway Pharyngeal- Puree Delayed swallow initiation-vallecula Pharyngeal -- Pharyngeal- Mechanical Soft Delayed swallow initiation-vallecula Pharyngeal -- Pharyngeal- Regular -- Pharyngeal -- Pharyngeal- Multi-consistency -- Pharyngeal -- Pharyngeal- Pill -- Pharyngeal -- Pharyngeal Comment --  CHL IP CERVICAL ESOPHAGEAL PHASE 02/07/2021 Cervical Esophageal Phase Impaired Pudding Teaspoon -- Pudding Cup -- Honey Teaspoon -- Honey Cup -- Nectar Teaspoon -- Nectar Cup -- Nectar Straw Esophageal backflow into the pharynx Thin Teaspoon -- Thin Cup -- Thin Straw -- Puree -- Mechanical Soft -- Regular -- Multi-consistency -- Pill -- Cervical Esophageal Comment -- DeBlois, Riley Nearing 02/07/2021, 1:43 PM              DG ESOPHAGUS W SINGLE CM  (SOL OR THIN BA)  Result Date: 02/08/2021 CLINICAL DATA:  Dysphagia EXAM: ESOPHOGRAM/BARIUM SWALLOW TECHNIQUE: Single contrast examination was performed using  thin barium. FLUOROSCOPY TIME:  Fluoroscopy Time:  2 minute Radiation Exposure Index (if provided by the fluoroscopic device): 47.8 mGy Number of Acquired Spot Images: 0 COMPARISON:  None. FINDINGS: Normal pharyngeal anatomy and motility. Contrast flowed freely through the esophagus without evidence of a mass. Relative narrowing of the distal esophagus just proximal to the esophagogastric junction concerning for a stricture. Normal esophageal mucosa without evidence of irregularity or ulceration. Tertiary contractions of the esophagus as can be seen with esophageal spasm. No evidence of reflux. No definite hiatal hernia was demonstrated. IMPRESSION: Relative narrowing of the distal esophagus just proximal to the esophagogastric junction concerning for a stricture. Tertiary contractions of the esophagus as can be seen with esophageal spasm. Electronically Signed   By: Elige Ko M.D.   On: 02/08/2021 15:19      IMPRESSION:      Acute onset of dysphagia to liquids as well as soft foods.  Esophagram demonstrating tertiary contractions in the esophagus and narrowing at distal esophagus with concern for stricture.     HCAP.  Respiratory distress resolved.  Current oxygen saturations in the low 90% range on 2 L nasal cannula oxygen.     History Fournier's gangrene 2020.  Status post extensive irrigation and debridement ultimately underwent diverting colostomy to avoid contaminating tissues in the thigh/groin.    MRSA UTI.  Marland Kitchen  PLAN:     EGD with esophageal dilatation.  Tomorrow?  Was receiving DVT prophylaxis SQ heparin but this is now discontinued, last injection was 0540 today.  Npo after midnight.     Jennye Moccasin  02/08/2021, 3:44 PM Phone [phone]

## 2021-02-08 NOTE — [place] FL2 (Signed)
[place] MEDICAID FL2 LEVEL OF CARE SCREENING TOOL     IDENTIFICATION  Patient Name: Courtney [name] Birthdate: 12-Jul-1945 Sex: female Admission Date (Current Location): 02/03/2021  [place] and [place] Number:  Haynes Bast [id number] Facility and Address:  [facility], [address] [place], [place] [address]      Provider Number: [id number]  Attending Physician Name and Address:  Starleen Arms, MD  Relative Name and Phone Number:  Pennel,STEPHANIE Daughter   [phone]    Current Level of Care: Hospital Recommended Level of Care: Skilled Nursing Facility Prior Approval Number:    Date Approved/Denied:   PASRR Number: [id number]  Discharge Plan: SNF    Current Diagnoses: Patient Active Problem List   Diagnosis Date Noted   Acute respiratory failure with hypoxia (HCC) 02/04/2021   Acute on chronic diastolic CHF (congestive heart failure) (HCC) 02/04/2021   Severe sepsis (HCC) 02/04/2021   CAP (community acquired pneumonia) 02/04/2021   Acute metabolic encephalopathy 02/04/2021   DM2 (diabetes mellitus, type 2) (HCC)    Acute exacerbation of chronic obstructive pulmonary disease (COPD) (HCC) 02/03/2021   Acute on chronic respiratory failure with hypoxia (HCC) 10/18/2019   COPD with acute exacerbation (HCC) 10/18/2019   New onset of congestive heart failure (HCC) 10/18/2019   Multifocal pneumonia 10/18/2019   Hyperkalemia 10/18/2019   Hypertension 07/08/2018   Obesity, Class III, BMI 40-49.9 (morbid obesity) (HCC) 07/08/2018   Sepsis (HCC)    Diabetes mellitus due to underlying condition, uncontrolled, with hyperglycemia, with long-term current use of insulin (HCC) 06/28/2018   Cigarette smoker 06/28/2018   OSA and COPD overlap syndrome (HCC) 06/26/2018   Pressure injury of skin 06/26/2018   Fournier's gangrene in female 06/25/2018   Hypertensive heart disease 08/21/2016   Normocytic anemia    Old MI (myocardial infarction)    HLD  (hyperlipidemia)    Postmenopausal atrophic vaginitis    Restless leg syndrome    COPD (chronic obstructive pulmonary disease) (HCC)    Arthritis    CAD (coronary artery disease)    Iron (Fe) deficiency anemia 04/05/2011    Orientation RESPIRATION BLADDER Height & Weight     Self  O2 External catheter Weight: 231 lb 0.7 oz (104.8 kg) Height:  5' 3.5" (161.3 cm)  BEHAVIORAL SYMPTOMS/MOOD NEUROLOGICAL BOWEL NUTRITION STATUS      Continent Diet (see discharge summary)  AMBULATORY STATUS COMMUNICATION OF NEEDS Skin   Total Care Verbally Normal                       Personal Care Assistance Level of Assistance  Bathing, Feeding, Dressing Bathing Assistance: Maximum assistance Feeding assistance: Limited assistance Dressing Assistance: Maximum assistance     Functional Limitations Info  Sight, Hearing, Speech Sight Info: Adequate Hearing Info: Adequate Speech Info: Adequate    SPECIAL CARE FACTORS FREQUENCY                       Contractures Contractures Info: Not present    Additional Factors Info  Code Status, Allergies Code Status Info: DNR Allergies Info: Penicillins           Current Medications (02/08/2021):  This is the current hospital active medication list Current Facility-Administered Medications  Medication Dose Route Frequency Provider Last Rate Last Admin   acetaminophen (TYLENOL) tablet 650 mg  650 mg Oral Q6H PRN Howerter, Justin B, DO   650 mg at 02/07/21 1658   Or  acetaminophen (TYLENOL) suppository 650 mg  650 mg Rectal Q6H PRN Howerter, Justin B, DO       albuterol (PROVENTIL) (2.5 MG/3ML) 0.083% nebulizer solution 2.5 mg  2.5 mg Nebulization Q4H PRN Howerter, Justin B, DO       amLODipine (NORVASC) tablet 10 mg  10 mg Oral Daily Chotiner, Claudean Severance, MD   10 mg at 02/08/21 0843   aspirin EC tablet 81 mg  81 mg Oral Daily Howerter, Justin B, DO   81 mg at 02/08/21 0844   atorvastatin (LIPITOR) tablet 80 mg  80 mg Oral QHS Howerter,  Justin B, DO   80 mg at 02/06/21 2127   azithromycin (ZITHROMAX) 500 mg in sodium chloride 0.9 % 250 mL IVPB  500 mg Intravenous Q24H Howerter, Justin B, DO 250 mL/hr at 02/08/21 0030 500 mg at 02/08/21 0030   cefTRIAXone (ROCEPHIN) 1 g in sodium chloride 0.9 % 100 mL IVPB  1 g Intravenous Q24H Howerter, Justin B, DO 200 mL/hr at 02/07/21 2321 1 g at 02/07/21 2321   dextrose 5 % solution   Intravenous Continuous Elgergawy, Leana Roe, MD 60 mL/hr at 02/07/21 1056 Rate Change at 02/07/21 1056   fluticasone (FLONASE) 50 MCG/ACT nasal spray 1 spray  1 spray Each Nare Daily Howerter, Justin B, DO   1 spray at 02/07/21 1056   guaiFENesin-dextromethorphan (ROBITUSSIN DM) 100-10 MG/5ML syrup 5 mL  5 mL Oral BID Elgergawy, Leana Roe, MD   5 mL at 02/08/21 0909   heparin injection 5,000 Units  5,000 Units Subcutaneous Q8H Elgergawy, Leana Roe, MD   5,000 Units at 02/08/21 0539   insulin aspart (novoLOG) injection 0-20 Units  0-20 Units Subcutaneous TID WC Elgergawy, Leana Roe, MD   20 Units at 02/07/21 1824   insulin aspart (novoLOG) injection 0-5 Units  0-5 Units Subcutaneous QHS Elgergawy, Leana Roe, MD   3 Units at 02/08/21 0028   insulin aspart (novoLOG) injection 4 Units  4 Units Subcutaneous TID WC Elgergawy, Leana Roe, MD       insulin glargine-yfgn (SEMGLEE) injection 20 Units  20 Units Subcutaneous Daily Elgergawy, Leana Roe, MD       linezolid (ZYVOX) tablet 600 mg  600 mg Oral Q12H Elgergawy, Leana Roe, MD   600 mg at 02/07/21 1658   methylPREDNISolone sodium succinate (SOLU-MEDROL) 40 mg/mL injection 40 mg  40 mg Intravenous Daily Elgergawy, Leana Roe, MD   40 mg at 02/08/21 0902   metoprolol succinate (TOPROL-XL) 24 hr tablet 25 mg  25 mg Oral Daily Howerter, Justin B, DO   25 mg at 02/08/21 0843   ondansetron (ZOFRAN) injection 4 mg  4 mg Intravenous Q6H PRN Elgergawy, Leana Roe, MD   4 mg at 02/07/21 1658   pantoprazole (PROTONIX) EC tablet 40 mg  40 mg Oral Daily Elgergawy, Leana Roe, MD   40 mg at  02/08/21 0844   saccharomyces boulardii (FLORASTOR) capsule 250 mg  250 mg Oral BID Elgergawy, Leana Roe, MD   250 mg at 02/08/21 1505   senna (SENOKOT) tablet 17.2 mg  2 tablet Oral Daily Elgergawy, Leana Roe, MD   17.2 mg at 02/08/21 0843   sertraline (ZOLOFT) tablet 50 mg  50 mg Oral Daily Elgergawy, Leana Roe, MD   50 mg at 02/08/21 6979     Discharge Medications: Please see discharge summary for a list of discharge medications.  Relevant Imaging Results:  Relevant Lab Results:   Additional Information SSN: [id number]  Lorri Frederick,  LCSW

## 2021-02-08 NOTE — TOC Initial Note (Addendum)
Transition of Care [facility]) - Initial/Assessment Note    Patient Details  Name: Courtney [name] MRN: [id number] Date of Birth: 1945/07/27  Transition of Care [facility]) CM/SW Contact:    Joanne Chars, LCSW Phone Number: 02/08/2021, 4:13 PM  Clinical Narrative:  CSW met with pt who was able to participate in conversation but somewhat confused.  Reports she lives with her husband, then acknowledged she lived at [place] when asked byCSW.  Permission given to speak with husband and daughter Colletta [place].    CSW LM with daughter Colletta [place].  1615: CSW confirmed with Shirlee Limerick at [facility] that pt is LTC resident at that facility.                   Expected Discharge Plan: Long Term Nursing Home Barriers to Discharge: Continued Medical Work up   Patient Goals and CMS Choice Patient states their goals for this hospitalization and ongoing recovery are:: "get healthy"      Expected Discharge Plan and Services Expected Discharge Plan: [place] Acute Care Choice: [place] Living arrangements for the past 2 months: [place]                                      Prior Living Arrangements/Services Living arrangements for the past 2 months: [place] Lives with:: Facility Resident Patient language and need for interpreter reviewed:: Yes        Need for Family Participation in Patient Care: Yes (Comment) Care giver support system in place?: Yes (comment) Current home services: Other (comment) (na) Criminal Activity/Legal Involvement Pertinent to Current Situation/Hospitalization: No - Comment as needed  Activities of Daily Living      Permission Sought/Granted Permission sought to share information with : Family Supports Permission granted to share information with : Yes, Verbal Permission Granted  Share Information with NAME: daughter Colletta [place]           Emotional Assessment Appearance::  Appears stated age Attitude/Demeanor/Rapport: Lethargic Affect (typically observed): Appropriate Orientation: : Oriented to Self Alcohol / Substance Use: Not Applicable Psych Involvement: No (comment)  Admission diagnosis:  Acute exacerbation of chronic obstructive pulmonary disease (COPD) ([place]) [J44.1] COPD exacerbation ([place]) [J44.1] Acute respiratory failure with hypoxia ([place]) [J96.01] Patient Active Problem List   Diagnosis Date Noted   Acute respiratory failure with hypoxia ([place]) 02/04/2021   Acute on chronic diastolic CHF (congestive heart failure) ([place]) 02/04/2021   Severe sepsis ([place]) 02/04/2021   CAP (community acquired pneumonia) 19/37/9024   Acute metabolic encephalopathy 09/73/5329   DM2 (diabetes mellitus, type 2) ([place])    Acute exacerbation of chronic obstructive pulmonary disease (COPD) ([place]) 02/03/2021   Acute on chronic respiratory failure with hypoxia ([place]) 10/18/2019   COPD with acute exacerbation ([place]) 10/18/2019   New onset of congestive heart failure ([place]) 10/18/2019   Multifocal pneumonia 10/18/2019   Hyperkalemia 10/18/2019   Hypertension 07/08/2018   Obesity, Class III, BMI 40-49.9 (morbid obesity) ([place]) 07/08/2018   Sepsis ([place])    Diabetes mellitus due to underlying condition, uncontrolled, with hyperglycemia, with long-term current use of insulin ([place]) 06/28/2018   Cigarette smoker 06/28/2018   OSA and COPD overlap syndrome ([place]) 06/26/2018   Pressure injury of skin 06/26/2018   Fournier's gangrene in female 06/25/2018   Hypertensive heart disease 08/21/2016   Normocytic anemia    Old MI (myocardial infarction)  HLD (hyperlipidemia)    Postmenopausal atrophic vaginitis    Restless leg syndrome    COPD (chronic obstructive pulmonary disease) (HCC)    Arthritis    CAD (coronary artery disease)    Iron (Fe) deficiency anemia 04/05/2011   PCP:  Mindi Curling, PA-C Pharmacy:   [place], [place]  [address] [place] [address] Phone: [phone] Fax: [phone]  [facility] [place], [address] [place] [address] Phone: [phone] Fax: [phone]     Social Determinants of Health (SDOH) Interventions    Readmission Risk Interventions No flowsheet data found.

## 2021-02-08 NOTE — Progress Notes (Signed)
Inpatient Diabetes Program Recommendations  AACE/ADA: New Consensus Statement on Inpatient Glycemic Control   Target Ranges:  Prepandial:   less than 140 mg/dL      Peak postprandial:   less than 180 mg/dL (1-2 hours)      Critically ill patients:  140 - 180 mg/dL   Results for Courtney [name], Courtney [name] (MRN [id number]) as of 02/08/2021 09:43  Ref. Range 02/07/2021 08:09 02/07/2021 11:25 02/07/2021 16:27 02/07/2021 16:27 02/07/2021 23:39 02/08/2021 05:42 02/08/2021 07:21  Glucose-Capillary Latest Ref Range: 70 - 99 mg/dL 322 (H) 025 (H) 427 (H) 456 (H) 278 (H) 77 71    Review of Glycemic Control  Diabetes history: DM2 Outpatient Diabetes medications: Admelog 5 units AC&HS, Novolog 0-16 units AC&HS, Lantus 21 units BID, Metformin 1000 mg BID Current orders for Inpatient glycemic control: Semglee 20 units daily, Novolog 0-20 units TID, Novolog 0-5 units QHS, Novolog 4 units TID with meals; Solumedrol 40 mg daily  Inpatient Diabetes Program Recommendations:    Insulin: Patient received Semglee 50 units on 02/07/21 and fasting glucose 71 mg/dl today. Noted Semglee was decreased to 20 units daily today. If steroids are continued as ordered, please consider increasing Semglee to 40 units daily and meal coverage to Novolog 6 units TID with meals.  Thanks, Orlando Penner, RN, MSN, CDE Diabetes Coordinator Inpatient Diabetes Program [phone] (Team Pager from 8am to 5pm)

## 2021-02-08 NOTE — Progress Notes (Signed)
Pt unable to swallow effectively. Dayshift RN is questioning possible aspiration.

## 2021-02-08 NOTE — Consult Note (Signed)
[facility] Consult: 3:44 PM 02/08/2021  LOS: 5 days    Referring Provider: Dr Randol Kern  Primary Care Physician:  Jordan Hawks, PA-C Primary Gastroenterologist:  unassigned SNF pt     Reason for Consultation: Dysphagia and esophagram suggesting distal esophageal stricture.   HPI: Courtney [name] is a 75 y.o. female.  PMH.  Morbid obesity CAD.  COPD.Marland Kitchen  Asthma.  IDDM. Iron deficiency anemia.   Fournier's gangrene 2020, status post I&D.  S/p diverting colostomy to prevent contamination.  S/p cholecystectomy and hysterectomy.    Admitted to [facility] 5 days ago with hypoxic respiratory distress, increased somnolence.  Vascular congestion on CXR.  Received treatment with steroids and ruled in for CAP though she is SNF patient so HCAP.  MRSA growing from urine. Patient complains of and displaying symptoms of dysphagia.  Coughing occurs w soft foods, liquids, nectar thick liquids. This has begun since the admission.  It was not an issue before.  Does not recall prior EGD and if she has had a colonoscopy it was decades ago.  Esophagram  today reveals narrowing at distal esophagus proximal to GE junction concerning for stricture.  Tertiary esophageal contractions.  Study did not include ingestion of barium tablet.    Past Medical History:  Diagnosis Date   Anemia    Arthritis    Asthma    CAD (coronary artery disease)    COPD (chronic obstructive pulmonary disease) (HCC)    Diabetes mellitus    Hyperlipidemia    Hypertension    Myocardial infarct (HCC)    Postmenopausal atrophic vaginitis    Restless leg syndrome     Past Surgical History:  Procedure Laterality Date   CHOLECYSTECTOMY  1970's   INCISION AND DRAINAGE ABSCESS Right 06/27/2018   Procedure: INCISION AND DEBRIDMENT OF PERI-RECTAL  NECROTIZING FASCIITIS.;  Surgeon: Romie Levee, MD;  Location: [facility];  Service: General;  Laterality: Right;   INCISION AND DRAINAGE PERIRECTAL ABSCESS N/A 06/25/2018   Procedure: IRRIGATION AND DEBRIDEMENT PERIRECTAL ABCESS;  Surgeon: Romie Levee, MD;  Location: [facility];  Service: General;  Laterality: N/A;   IRRIGATION AND DEBRIDEMENT ABSCESS N/A 07/01/2018   Procedure: IRRIGATION AND DEBRIDEMENT ABSCESS;  Surgeon: Emelia Loron, MD;  Location: [facility];  Service: General;  Laterality: N/A;   OTHER SURGICAL HISTORY  1970's   hysterectomy   WOUND DEBRIDEMENT N/A 07/03/2018   Procedure: DEBRIDEMENT PELVIS WITH DRESSING CHANGE;  Surgeon: Emelia Loron, MD;  Location: [facility];  Service: General;  Laterality: N/A;    Prior to Admission medications   Medication Sig Start Date End Date Taking? Authorizing Provider  acetaminophen (TYLENOL) 650 MG CR tablet Take 650 mg by mouth every 6 (six) hours as needed for pain.   Yes [provider]  ADMELOG SOLOSTAR 100 UNIT/ML KwikPen Inject 5 Units into the skin. Before meals and at bedtime 12/28/20  Yes [provider]  amLODipine (NORVASC) 10 MG tablet Take 1 tablet (10 mg total) by mouth daily. 07/09/18  Yes Dhungel, Nishant, MD  arformoterol (BROVANA) 15 MCG/2ML NEBU  Take 2 mLs (15 mcg total) by nebulization 2 (two) times daily. 10/21/19  Yes Rhetta Mura, MD  aspirin EC 81 MG EC tablet Take 1 tablet (81 mg total) by mouth daily. 07/09/18  Yes Dhungel, Nishant, MD  atorvastatin (LIPITOR) 80 MG tablet Take 80 mg by mouth at bedtime.   Yes [provider]  budesonide (PULMICORT) 0.25 MG/2ML nebulizer solution Take 2 mLs (0.25 mg total) by nebulization 2 (two) times daily. 10/21/19  Yes Rhetta Mura, MD  D-5000 125 MCG (5000 UT) TABS Take 1 tablet by mouth daily. 09/19/19  Yes [provider]  famotidine (PEPCID) 20 MG tablet Take 20 mg by mouth 2 (two) times daily.   Yes [provider]  ferrous  sulfate 325 (65 FE) MG tablet Take 1 tablet (325 mg total) by mouth 2 (two) times daily with a meal. Patient taking differently: Take 325 mg by mouth daily with breakfast. 10/21/19  Yes Rhetta Mura, MD  fluticasone (FLONASE) 50 MCG/ACT nasal spray Place 1 spray into both nostrils daily. 09/19/19  Yes [provider]  hydrOXYzine (ATARAX/VISTARIL) 25 MG tablet Take 25 mg by mouth in the morning and at bedtime.   Yes [provider]  insulin aspart (NOVOLOG) 100 UNIT/ML injection Inject 0-16 Units into the skin See admin instructions. Inject before meals and at bedtime, per sliding scale: 0-150 = 0U 151-200 = 4U 201-250 = 6U 251-300 = 8U 301-350 = 10U 351-400 = 14U 401-450U = 16U   Yes [provider]  LANTUS SOLOSTAR 100 UNIT/ML Solostar Pen Inject 21 Units into the skin 2 (two) times daily. 04/16/18  Yes [provider]  melatonin 3 MG TABS tablet Take 1 tablet (3 mg total) by mouth at bedtime. 10/21/19  Yes Rhetta Mura, MD  memantine (NAMENDA) 5 MG tablet Take 5 mg by mouth at bedtime.   Yes [provider]  metFORMIN (GLUCOPHAGE) 1000 MG tablet Take 1,000 mg by mouth 2 (two) times daily with a meal. 12/19/20  Yes [provider]  metoprolol succinate (TOPROL-XL) 25 MG 24 hr tablet Take 25 mg by mouth daily.   Yes [provider]  polyethylene glycol (MIRALAX / GLYCOLAX) 17 g packet Take 17 g by mouth daily.   Yes [provider]  predniSONE (DELTASONE) 20 MG tablet Take 20 mg by mouth daily with breakfast.   Yes [provider]  saccharomyces boulardii (FLORASTOR) 250 MG capsule Take 250 mg by mouth 2 (two) times daily.   Yes [provider]  senna (SENOKOT) 8.6 MG TABS tablet Take 2 tablets by mouth daily.   Yes [provider]  sertraline (ZOLOFT) 50 MG tablet Take 50 mg by mouth daily.    Yes [provider]  tiotropium (SPIRIVA) 18 MCG inhalation capsule Place 18 mcg  into inhaler and inhale daily.   Yes [provider]  Ensure Max Protein (ENSURE MAX PROTEIN) LIQD Take 330 mLs (11 oz total) by mouth 2 (two) times daily. Patient not taking: No sig reported 07/08/18   Dhungel, Nishant, MD  ipratropium-albuterol (DUONEB) 0.5-2.5 (3) MG/3ML SOLN Take 3 mLs by nebulization every 6 (six) hours as needed. Patient not taking: No sig reported 07/08/18   Dhungel, Nishant, MD  nutrition supplement, JUVEN, (JUVEN) PACK Take 1 packet by mouth 2 (two) times daily between meals. Patient not taking: No sig reported 07/09/18   Dhungel, Nishant, MD    Scheduled Meds:  amLODipine  10 mg Oral Daily   aspirin EC  81 mg Oral Daily   atorvastatin  80 mg Oral QHS   fluticasone  1 spray Each Nare Daily   guaiFENesin-dextromethorphan  5 mL Oral BID   insulin aspart  0-20 Units Subcutaneous TID WC   insulin aspart  0-5 Units Subcutaneous QHS   insulin aspart  4 Units Subcutaneous TID WC   insulin glargine-yfgn  20 Units Subcutaneous Daily   linezolid  600 mg Oral Q12H   metoprolol succinate  25 mg Oral Daily   pantoprazole  40 mg Oral Daily   saccharomyces boulardii  250 mg Oral BID   senna  2 tablet Oral Daily   sertraline  50 mg Oral Daily   sodium zirconium cyclosilicate  10 g Oral Once   Infusions:  azithromycin 500 mg (02/08/21 0030)   cefTRIAXone (ROCEPHIN)  IV     dextrose 60 mL/hr at 02/07/21 1056   PRN Meds: acetaminophen **OR** acetaminophen, albuterol, ondansetron (ZOFRAN) IV   Allergies as of 02/03/2021 - Review Complete 02/03/2021  Allergen Reaction Noted   Penicillins  04/05/2011    Family History  Problem Relation Age of Onset   Hypertension Brother    Heart attack Neg Hx     Social History   Socioeconomic History   Marital status: Married    Spouse name: Not on file   Number of children: Not on file   Years of education: Not on file   Highest education level: Not on file  Occupational History   Not on file  Tobacco Use   Smoking  status: Every Day    Packs/day: 0.50    Types: Cigarettes   Smokeless tobacco: Never  Substance and Sexual Activity   Alcohol use: No   Drug use: No   Sexual activity: Not on file  Other Topics Concern   Not on file  Social History Narrative   Not on file   Social Determinants of Health   Financial Resource Strain: Not on file  Food Insecurity: Not on file  Transportation Needs: Not on file  Physical Activity: Not on file  Stress: Not on file  Social Connections: Not on file  Intimate Partner Violence: Not on file    REVIEW OF SYSTEMS: Constitutional: Generally deconditioned and weak but no profound fatigue. ENT:  No nose bleeds Pulm: Breathing improved.  No productive cough. CV:  No palpitations, no angina.  + Lower extremity edema is fairly chronic. GU:  No hematuria, no frequency GI: See HPI.  Ostomy is functioning well with output of soft brown stool. Heme: Denies excessive or unusual bleeding or bruising. Transfusions: None Neuro:  No headaches, no peripheral tingling or numbness.  No seizures, no syncope Derm:  No itching, no rash or sores.  Endocrine:  No sweats or chills.  No polyuria or dysuria Immunization: Reviewed. Travel:  None    PHYSICAL EXAM: Vital signs in last 24 hours: Vitals:   02/08/21 0800 02/08/21 1126  BP: 119/74 128/85  Pulse: 71 64  Resp: 20 20  Temp: 98.7 F (37.1 C) 98.6 F (37 C)  SpO2:  93%   Wt Readings from Last 3 Encounters:  02/07/21 104.8 kg  10/21/19 98.3 kg  07/03/18 102.7 kg    General: Morbidly obese, chronically unwell appearing but comfortable resting in the bed.  Husband attentive at bedside. Head: No facial asymmetry or swelling.  No signs of head trauma. Eyes: No scleral icterus.  No conjunctival pallor. Ears: Not hard of hearing Nose: No discharge or congestion Mouth: Poor  dentition.  Mucosa is moist, pink, clear.  Nonspecific white coating on the tongue, not necessarily Candida. Neck: No mass, no JVD Lungs:  No labored breathing.  No cough. Heart: RRR, rate in 70s.  No MRG.  S1, S2 present. Abdomen: Soft, obese, nondistended.  Soft brown stool in ostomy positioned at LLQ.  No tenderness.  No HSM, masses, bruits, hernias..   Rectal: Not performed Musc/Skeltl: No joint redness, swelling or significant deformities. Extremities: Nonpitting lower extremity edema. Neurologic: Alert.  Appropriate.  Oriented x3.  Moves all 4 limbs without tremor, strength not tested. Skin: On incomplete dermatologic survey, no suspicious lesions or rash. Nodes: No cervical adenopathy Psych: Pleasant, calm, engaged.  Intake/Output from previous day: No intake/output data recorded. Intake/Output this shift: No intake/output data recorded.  LAB RESULTS: Recent Labs    02/06/21 0204 02/07/21 0548 02/08/21 0151  WBC 12.4* 17.4* 15.5*  HGB 14.2 14.4 14.4  HCT 49.9* 50.1* 50.6*  PLT 230 212 207   BMET Lab Results  Component Value Date   NA 141 02/08/2021   NA 152 (H) 02/07/2021   NA 150 (H) 02/06/2021   K 5.0 02/08/2021   K 4.7 02/07/2021   K 4.3 02/06/2021   CL 100 02/08/2021   CL 112 (H) 02/07/2021   CL 106 02/06/2021   CO2 33 (H) 02/08/2021   CO2 31 02/07/2021   CO2 35 (H) 02/06/2021   GLUCOSE 198 (H) 02/08/2021   GLUCOSE 280 (H) 02/07/2021   GLUCOSE 327 (H) 02/06/2021   BUN 28 (H) 02/08/2021   BUN 35 (H) 02/07/2021   BUN 36 (H) 02/06/2021   CREATININE 0.75 02/08/2021   CREATININE 0.87 02/07/2021   CREATININE 0.84 02/06/2021   CALCIUM 9.8 02/08/2021   CALCIUM 9.8 02/07/2021   CALCIUM 10.4 (H) 02/06/2021   LFT No results for input(s): PROT, ALBUMIN, AST, ALT, ALKPHOS, BILITOT, BILIDIR, IBILI in the last 72 hours. PT/INR Lab Results  Component Value Date   INR 1.0 02/03/2021   INR 1.2 06/25/2018   INR 1.2 06/25/2018   Hepatitis Panel No results for input(s): HEPBSAG, HCVAB, HEPAIGM, HEPBIGM in the last 72 hours. C-Diff No components found for: CDIFF Lipase  No results found for:  LIPASE  Drugs of Abuse     Component Value Date/Time   LABOPIA NONE DETECTED 02/04/2021 0630   COCAINSCRNUR NONE DETECTED 02/04/2021 0630   LABBENZ NONE DETECTED 02/04/2021 0630   AMPHETMU NONE DETECTED 02/04/2021 0630   THCU NONE DETECTED 02/04/2021 0630   LABBARB NONE DETECTED 02/04/2021 0630     RADIOLOGY STUDIES: CT HEAD WO CONTRAST ( )  Result Date: 02/07/2021 CLINICAL DATA:  Mental status change EXAM: CT HEAD WITHOUT CONTRAST TECHNIQUE: Contiguous axial images were obtained from the base of the skull through the vertex without intravenous contrast. COMPARISON:  11/01/2018 FINDINGS: Brain: No evidence of acute infarction, hemorrhage, cerebral edema, mass, mass effect, or midline shift. Periventricular white matter changes, likely the sequela of chronic small vessel ischemic disease. No extra-axial fluid collection. Vascular: No hyperdense vessel or unexpected calcification. Skull: Normal. Negative for fracture or focal lesion. Sinuses/Orbits: No acute finding. Other: The mastoid air cells are well aerated. IMPRESSION: No acute intracranial process. Electronically Signed   By: Wiliam Ke M.D.   On: 02/07/2021 11:37   DG Swallowing Func-Speech Pathology  Result Date: 02/07/2021 Table formatting from the original result was not included. Objective Swallowing Evaluation: Type of Study: MBS-Modified Barium Swallow Study  Patient Details Name: Courtney [name] MRN: [id number] Date of  Birth: 29-Mar-1946 Today's Date: 02/07/2021 Time: SLP Start Time (ACUTE ONLY): 1230 -SLP Stop Time (ACUTE ONLY): 1250 SLP Time Calculation (min) (ACUTE ONLY): 20 min Past Medical History: Past Medical History: Diagnosis Date  Anemia   Arthritis   Asthma   CAD (coronary artery disease)   COPD (chronic obstructive pulmonary disease) (HCC)   Diabetes mellitus   Hyperlipidemia   Hypertension   Myocardial infarct (HCC)   Postmenopausal atrophic vaginitis   Restless leg syndrome  Past Surgical History: Past Surgical  History: Procedure Laterality Date  CHOLECYSTECTOMY  1970's  INCISION AND DRAINAGE ABSCESS Right 06/27/2018  Procedure: 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.;  Surgeon: Romie Levee, MD;  Location: [facility];  Service: General;  Laterality: Right;  INCISION AND DRAINAGE PERIRECTAL ABSCESS N/A 06/25/2018  Procedure: IRRIGATION AND DEBRIDEMENT PERIRECTAL ABCESS;  Surgeon: Romie Levee, MD;  Location: [facility];  Service: General;  Laterality: N/A;  IRRIGATION AND DEBRIDEMENT ABSCESS N/A 07/01/2018  Procedure: IRRIGATION AND DEBRIDEMENT ABSCESS;  Surgeon: Emelia Loron, MD;  Location: [facility];  Service: General;  Laterality: N/A;  OTHER SURGICAL HISTORY  1970's  hysterectomy  WOUND DEBRIDEMENT N/A 07/03/2018  Procedure: DEBRIDEMENT PELVIS WITH DRESSING CHANGE;  Surgeon: Emelia Loron, MD;  Location: [facility];  Service: General;  Laterality: N/A; HPI: Courtney [name] is a 75 y.o. female with medical history significant for COPD, type 2 diabetes mellitus, hypertension, hyperlipidemia, chronic diastolic heart failure, who is admitted to [facility] on 02/03/2021 with acute hypoxic respiratory distress after presenting from snf to [facility] ED for evaluation of increased somnolence.  ED work-up significant for temperature of 99.3, oxygen saturation 88% on room air, chest x-ray significant for vascular congestion, she is with diffuse wheezing and diminished air entry, admitted for further management.Pt coughing after [place].  No data recorded Assessment / Plan / Recommendation CHL IP CLINICAL IMPRESSIONS 02/07/2021 Clinical Impression Pt demonstrates a primairly oral dysphagia with lingual rocking of bolus and decreased bolus cohesion with spillage of the head of bolus with tail remaining in oral cavity. There are instances of aspiration before the swallow with thin and also after as pt transits the remaining bolus to an open airway. Sensation is inconsistent and often delayed. Pt also observed to have  backflow of liquids from esophagus to pharynx with belching and esophageal sweep shows moderate stasis in cervical and thoracic esophageal column. Pt has a moderate risk of postprandial aspiration. Recommend ongoing use of nectar thick liquids and pureed solids with esophageal precautions, primairly slow rate and upright positioning and sustaining upright position after meals. Will follow for potential for uprgrade which may be possible if mentation improves as pt was still weak and inattentive today. SLP Visit Diagnosis Dysphagia, oral phase (R13.11) Attention and concentration deficit following -- Frontal lobe and executive function deficit following -- Impact on safety and function Mild aspiration risk;Risk for inadequate nutrition/hydration   CHL IP TREATMENT RECOMMENDATION 02/07/2021 Treatment Recommendations Therapy as outlined in treatment plan below   Prognosis 02/07/2021 Prognosis for Safe Diet Advancement Fair Barriers to Reach Goals Cognitive deficits Barriers/Prognosis Comment -- CHL IP DIET RECOMMENDATION 02/07/2021 SLP Diet Recommendations Dysphagia 1 (Puree) solids;Nectar thick liquid Liquid Administration via Cup;Straw Medication Administration Crushed with puree Compensations Slow rate;Small sips/bites Postural Changes Seated upright at 90 degrees;Remain semi-upright after after feeds/meals (Comment)   No flowsheet data found.  CHL IP FOLLOW UP RECOMMENDATIONS 02/07/2021 Follow up Recommendations Skilled Nursing facility   [facility] IP FREQUENCY AND DURATION 02/07/2021 Speech Therapy Frequency (ACUTE ONLY) min  2x/week Treatment Duration 2 weeks      CHL IP ORAL PHASE 02/07/2021 Oral Phase Impaired Oral - Pudding Teaspoon -- Oral - Pudding Cup -- Oral - Honey Teaspoon -- Oral - Honey Cup -- Oral - Nectar Teaspoon -- Oral - Nectar Cup -- Oral - Nectar Straw Decreased bolus cohesion;Delayed oral transit;Premature spillage Oral - Thin Teaspoon -- Oral - Thin Cup -- Oral - Thin Straw Premature  spillage;Decreased bolus cohesion;Delayed oral transit;Lingual pumping Oral - Puree Lingual pumping;Delayed oral transit;Decreased bolus cohesion;Premature spillage Oral - Mech Soft Premature spillage;Decreased bolus cohesion;Delayed oral transit;Lingual pumping Oral - Regular -- Oral - Multi-Consistency -- Oral - Pill -- Oral Phase - Comment --  CHL IP PHARYNGEAL PHASE 02/07/2021 Pharyngeal Phase Impaired Pharyngeal- Pudding Teaspoon -- Pharyngeal -- Pharyngeal- Pudding Cup -- Pharyngeal -- Pharyngeal- Honey Teaspoon -- Pharyngeal -- Pharyngeal- Honey Cup -- Pharyngeal -- Pharyngeal- Nectar Teaspoon -- Pharyngeal -- Pharyngeal- Nectar Cup -- Pharyngeal -- Pharyngeal- Nectar Straw Penetration/Aspiration before swallow Pharyngeal Material enters airway, remains ABOVE vocal cords then ejected out;Material does not enter airway Pharyngeal- Thin Teaspoon -- Pharyngeal -- Pharyngeal- Thin Cup -- Pharyngeal -- Pharyngeal- Thin Straw Penetration/Aspiration before swallow;Penetration/Apiration after swallow;Delayed swallow initiation-pyriform sinuses Pharyngeal Material enters airway, passes BELOW cords without attempt by patient to eject out (silent aspiration);Material does not enter airway Pharyngeal- Puree Delayed swallow initiation-vallecula Pharyngeal -- Pharyngeal- Mechanical Soft Delayed swallow initiation-vallecula Pharyngeal -- Pharyngeal- Regular -- Pharyngeal -- Pharyngeal- Multi-consistency -- Pharyngeal -- Pharyngeal- Pill -- Pharyngeal -- Pharyngeal Comment --  CHL IP CERVICAL ESOPHAGEAL PHASE 02/07/2021 Cervical Esophageal Phase Impaired Pudding Teaspoon -- Pudding Cup -- Honey Teaspoon -- Honey Cup -- Nectar Teaspoon -- Nectar Cup -- Nectar Straw Esophageal backflow into the pharynx Thin Teaspoon -- Thin Cup -- Thin Straw -- Puree -- Mechanical Soft -- Regular -- Multi-consistency -- Pill -- Cervical Esophageal Comment -- DeBlois, Riley Nearing 02/07/2021, 1:43 PM              DG ESOPHAGUS W SINGLE CM  (SOL OR THIN BA)  Result Date: 02/08/2021 CLINICAL DATA:  Dysphagia EXAM: ESOPHOGRAM/BARIUM SWALLOW TECHNIQUE: Single contrast examination was performed using  thin barium. FLUOROSCOPY TIME:  Fluoroscopy Time:  2 minute Radiation Exposure Index (if provided by the fluoroscopic device): 47.8 mGy Number of Acquired Spot Images: 0 COMPARISON:  None. FINDINGS: Normal pharyngeal anatomy and motility. Contrast flowed freely through the esophagus without evidence of a mass. Relative narrowing of the distal esophagus just proximal to the esophagogastric junction concerning for a stricture. Normal esophageal mucosa without evidence of irregularity or ulceration. Tertiary contractions of the esophagus as can be seen with esophageal spasm. No evidence of reflux. No definite hiatal hernia was demonstrated. IMPRESSION: Relative narrowing of the distal esophagus just proximal to the esophagogastric junction concerning for a stricture. Tertiary contractions of the esophagus as can be seen with esophageal spasm. Electronically Signed   By: Elige Ko M.D.   On: 02/08/2021 15:19      IMPRESSION:      Acute onset of dysphagia to liquids as well as soft foods.  Esophagram demonstrating tertiary contractions in the esophagus and narrowing at distal esophagus with concern for stricture.     HCAP.  Respiratory distress resolved.  Current oxygen saturations in the low 90% range on 2 L nasal cannula oxygen.     History Fournier's gangrene 2020.  Status post extensive irrigation and debridement ultimately underwent diverting colostomy to avoid contaminating tissues in the thigh/groin.    MRSA UTI.  Marland Kitchen  PLAN:     EGD with esophageal dilatation.  Tomorrow?  Was receiving DVT prophylaxis SQ heparin but this is now discontinued, last injection was 0540 today.  Npo after midnight.     Jennye Moccasin  02/08/2021, 3:44 PM Phone [phone]

## 2021-02-09 ENCOUNTER — Encounter (HOSPITAL_COMMUNITY)
Admission: EM | Disposition: A | Discharge status: Skilled Nursing Facility | Payer: Self-pay | Source: Skilled Nursing Facility | Attending: Internal Medicine

## 2021-02-09 ENCOUNTER — Telehealth: Payer: Self-pay

## 2021-02-09 ENCOUNTER — Inpatient Hospital Stay (HOSPITAL_COMMUNITY): Payer: Medicare (Managed Care) | Admitting: Anesthesiology

## 2021-02-09 ENCOUNTER — Encounter (HOSPITAL_COMMUNITY): Payer: Self-pay | Admitting: Internal Medicine

## 2021-02-09 DIAGNOSIS — K3189 Other diseases of stomach and duodenum: Secondary | ICD-10-CM

## 2021-02-09 DIAGNOSIS — R1310 Dysphagia, unspecified: Secondary | ICD-10-CM | POA: Diagnosis not present

## 2021-02-09 DIAGNOSIS — K259 Gastric ulcer, unspecified as acute or chronic, without hemorrhage or perforation: Secondary | ICD-10-CM

## 2021-02-09 HISTORY — PX: ESOPHAGOGASTRODUODENOSCOPY (EGD) WITH PROPOFOL: SHX5813

## 2021-02-09 HISTORY — PX: BIOPSY: SHX5522

## 2021-02-09 LAB — BASIC METABOLIC PANEL
Anion gap: 4 — ABNORMAL LOW (ref 5–15)
BUN: 29 mg/dL — ABNORMAL HIGH (ref 8–23)
CO2: 38 mmol/L — ABNORMAL HIGH (ref 22–32)
Calcium: 9.7 mg/dL (ref 8.9–10.3)
Chloride: 96 mmol/L — ABNORMAL LOW (ref 98–111)
Creatinine, Ser: 0.81 mg/dL (ref 0.44–1.00)
GFR, Estimated: >60 mL/min (ref >60)
Glucose, Bld: 227 mg/dL — ABNORMAL HIGH (ref 70–99)
Potassium: 4.4 mmol/L (ref 3.5–5.1)
Sodium: 138 mmol/L (ref 135–145)

## 2021-02-09 LAB — CBC
HCT: 47.8 % — ABNORMAL HIGH (ref 36.0–46.0)
Hemoglobin: 14.1 g/dL (ref 12.0–15.0)
MCH: 25.3 pg — ABNORMAL LOW (ref 26.0–34.0)
MCHC: 29.5 g/dL — ABNORMAL LOW (ref 30.0–36.0)
MCV: 85.7 fL (ref 80.0–100.0)
Platelets: 248 10*3/uL (ref 150–400)
RBC: 5.58 MIL/uL — ABNORMAL HIGH (ref 3.87–5.11)
RDW: 15.5 % (ref 11.5–15.5)
WBC: 16.8 10*3/uL — ABNORMAL HIGH (ref 4.0–10.5)
nRBC: 0.3 % — ABNORMAL HIGH (ref 0.0–0.2)

## 2021-02-09 LAB — GLUCOSE, CAPILLARY
Glucose-Capillary: 234 mg/dL — ABNORMAL HIGH (ref 70–99)
Glucose-Capillary: 270 mg/dL — ABNORMAL HIGH (ref 70–99)
Glucose-Capillary: 314 mg/dL — ABNORMAL HIGH (ref 70–99)
Glucose-Capillary: 78 mg/dL (ref 70–99)
Glucose-Capillary: 93 mg/dL (ref 70–99)

## 2021-02-09 SURGERY — ESOPHAGOGASTRODUODENOSCOPY (EGD) WITH PROPOFOL
Anesthesia: Monitor Anesthesia Care

## 2021-02-09 MED ORDER — PROPOFOL 500 MG/50ML IV EMUL
INTRAVENOUS | Status: DC | PRN
  Administered 2021-02-09: 75 ug/kg/min via INTRAVENOUS

## 2021-02-09 MED ORDER — LACTATED RINGERS IV SOLN: INTRAVENOUS | Status: DC | PRN

## 2021-02-09 MED ORDER — ENOXAPARIN SODIUM 40 MG/0.4ML IJ SOSY
40.0000 mg | PREFILLED_SYRINGE | INTRAMUSCULAR | Status: DC
  Administered 2021-02-09 – 2021-02-12 (×4): 40 mg via SUBCUTANEOUS
  Filled 2021-02-09 (×4): qty 0.4

## 2021-02-09 MED ORDER — INSULIN ASPART 100 UNIT/ML IJ SOLN
7.0000 [IU] | Freq: Once | INTRAMUSCULAR | Status: AC
  Administered 2021-02-09: 7 [IU] via SUBCUTANEOUS

## 2021-02-09 MED ORDER — FUROSEMIDE 40 MG PO TABS
40.0000 mg | ORAL_TABLET | Freq: Every day | ORAL | Status: DC
  Administered 2021-02-09 – 2021-02-13 (×5): 40 mg via ORAL
  Filled 2021-02-09 (×5): qty 1

## 2021-02-09 MED ORDER — PHENYLEPHRINE 40 MCG/ML (10ML) SYRINGE FOR IV PUSH (FOR BLOOD PRESSURE SUPPORT)
PREFILLED_SYRINGE | INTRAVENOUS | Status: DC | PRN
  Administered 2021-02-09: 80 ug via INTRAVENOUS

## 2021-02-09 MED ORDER — LIDOCAINE 2% (20 MG/ML) 5 ML SYRINGE
INTRAMUSCULAR | Status: DC | PRN
  Administered 2021-02-09: 40 mg via INTRAVENOUS

## 2021-02-09 SURGICAL SUPPLY — 15 items

## 2021-02-09 NOTE — Op Note (Signed)
[facility] Patient Name: Courtney [name] Procedure Date : 02/09/2021 MRN: [id number] Attending MD: Particia Lather ,  Date of Birth: November 21, 1945 CSN: [id number] Age: 75 Admit Type: Inpatient Procedure:                Upper GI endoscopy Indications:              Dysphagia Providers:                Madelyn Brunner" Duanne Guess, RN, Brooke                            Person, Leanne Lovely, Technician, Alan Ripper Referring MD:             Hospitalist group Medicines:                Monitored Anesthesia Care Complications:            No immediate complications. Estimated Blood Loss:     Estimated blood loss was minimal. Procedure:                Pre-Anesthesia Assessment:                           - Prior to the procedure, a History and Physical                            was performed, and patient medications and                            allergies were reviewed. The patient's tolerance of                            previous anesthesia was also reviewed. The risks                            and benefits of the procedure and the sedation                            options and risks were discussed with the patient.                            All questions were answered, and informed consent                            was obtained. Prior Anticoagulants: The patient has                            taken no previous anticoagulant or antiplatelet                            agents. ASA Grade Assessment: III - A patient with                            severe systemic disease. After reviewing the risks  and benefits, the patient was deemed in                            satisfactory condition to undergo the procedure.                           After obtaining informed consent, the endoscope was                            passed under direct vision. Throughout the                            procedure, the patient's blood pressure, pulse, and                             oxygen saturations were monitored continuously. The                            [id number]-H190 ([id number]) Olympus endoscope was introduced                            through the mouth, and advanced to the second part                            of duodenum. The upper GI endoscopy was                            accomplished without difficulty. The patient                            tolerated the procedure well. Scope In: Scope Out: Findings:      Scattered mucosal changes characterized by nodularity and an abnormal       appearance suspicious for Barrett's were found in the lower third of the       esophagus. Biopsies were taken with a cold forceps for histology.      Multiple localized erosions with no bleeding and no stigmata of recent       bleeding were found in the gastric antrum. Biopsies were taken with a       cold forceps for Helicobacter pylori testing.      Mildly erythematous mucosa without active bleeding and with no stigmata       of bleeding was found in the duodenal bulb. Impression:               - Nodular, abnormal (rule out Barrett's esophagus)                            mucosa in the esophagus. Biopsied.                           - Gastric erosions with no bleeding and no stigmata                            of recent bleeding. Biopsied.                           -  Erythematous duodenopathy. Recommendation:           - Return patient to hospital ward for ongoing care.                           - No obvious cause for dysphagia was noted during                            today's examination. Patient may benefit from                            esophageal manometry as an outpatient in the future.                           - Use a proton pump inhibitor PO BID for 8 weeks.                           - Await pathology results.                           - We will arrange for GI clinic follow up.                           - The findings and recommendations were  discussed                            with the patient and/or primary team. Procedure Code(s):        --- Professional ---                           [phone], Esophagogastroduodenoscopy, flexible,                            transoral; with biopsy, single or multiple Diagnosis Code(s):        --- Professional ---                           K22.8, Other specified diseases of esophagus                           K25.9, Gastric ulcer, unspecified as acute or                            chronic, without hemorrhage or perforation                           K31.89, Other diseases of stomach and duodenum                           R13.10, Dysphagia, unspecified CPT copyright 2019 [employer]. All rights reserved. The codes documented in this report are preliminary and upon coder review may  be revised to meet current compliance requirements. Courtney [name] "[name],  02/09/2021 1:27:51 PM Number of Addenda: 0

## 2021-02-09 NOTE — Consult Note (Signed)
WOC Nurse ostomy follow up WOC Nurse ostomy consult note Patient receiving care in [facility] 2W04 Stoma type/location: LLQ colostomy Stomal assessment/size: 3/8" Skin level Peristomal assessment: intact Treatment options for stomal/peristomal skin: Skin barrier ring Output: soft yellow Ostomy pouching: 1pc Convex  Education provided: None Enrolled patient in [employer] DC program: No (Lives in a SNF)   1 piece fecal convex pouch [place] # [phone] Garnetta Buddy # [phone]  Thank you for the consult. WOC nurse will not follow at this time.   Please re-consult the WOC team if needed.  Renaldo Reel Katrinka Blazing, MSN, RN, CMSRN, Angus Seller, [facility] Wound Treatment Associate Pager [phone]

## 2021-02-09 NOTE — Interval H&P Note (Signed)
History and Physical Interval Note:  02/09/2021 12:40 PM  [name]  has presented today for surgery, with the diagnosis of Dysphagia.  Abnormal esophagram..  The various methods of treatment have been discussed with the patient and family. After consideration of risks, benefits and other options for treatment, the patient has consented to  Procedure(s): ESOPHAGOGASTRODUODENOSCOPY (EGD) WITH PROPOFOL (N/A) SAVORY DILATION (N/A) as a surgical intervention.  The patient's history has been reviewed, patient examined, no change in status, stable for surgery.  I have reviewed the patient's chart and labs.  Questions were answered to the patient's satisfaction.     Courtney [name]

## 2021-02-09 NOTE — Transfer of Care (Signed)
Immediate Anesthesia Transfer of Care Note  Patient: EMELINE SIMPSON  Procedure(s) Performed: ESOPHAGOGASTRODUODENOSCOPY (EGD) WITH PROPOFOL BIOPSY  Patient Location: Endoscopy Unit  Anesthesia Type:MAC  Level of Consciousness: drowsy  Airway & Oxygen Therapy: Patient Spontanous Breathing and Patient connected to face mask oxygen  Post-op Assessment: Report given to RN and Post -op Vital signs reviewed and stable  Post vital signs: Reviewed and stable  Last Vitals:  Vitals Value Taken Time  BP 137/61   Temp    Pulse 79   Resp 18   SpO2 99%     Last Pain:  Vitals:   02/09/21 1153  TempSrc: Temporal  PainSc: 0-No pain      Patients Stated Pain Goal: 0 (59/53/96 [id number])  Complications: No notable events documented.

## 2021-02-09 NOTE — Anesthesia Procedure Notes (Signed)
Procedure Name: MAC Date/Time: 02/09/2021 12:52 PM Performed by: Janene Harvey, CRNA Pre-anesthesia Checklist: Patient identified, Emergency Drugs available, Suction available and Patient being monitored Patient Re-evaluated:Patient Re-evaluated prior to induction Oxygen Delivery Method: Simple face mask Induction Type: IV induction Placement Confirmation: positive ETCO2 Dental Injury: Teeth and Oropharynx as per pre-operative assessment

## 2021-02-09 NOTE — Anesthesia Preprocedure Evaluation (Addendum)
Anesthesia Evaluation  Patient identified by MRN, date of birth, ID band Patient awake    Reviewed: Allergy & Precautions, NPO status , Patient's Chart, lab work & pertinent test results  Airway Mallampati: III  TM Distance: >3 FB Neck ROM: Full    Dental no notable dental hx. (+) Poor Dentition, Missing, Dental Advisory Given,    Pulmonary asthma , sleep apnea (no CPAP) , COPD,  COPD inhaler, Current Smoker,    Pulmonary exam normal breath sounds clear to auscultation       Cardiovascular hypertension, Pt. on medications pulmonary hypertension (mod pHTN)+ CAD, + Past MI, + Cardiac Stents (4-5 years ago) and +CHF  Normal cardiovascular exam Rhythm:Regular Rate:Normal  Echo 01/2021: 1. Left ventricular ejection fraction, by estimation, is 60 to 65%. The  left ventricle has normal function. The left ventricle has no regional  wall motion abnormalities. There is moderate left ventricular hypertrophy.  Left ventricular diastolic  parameters are indeterminate.  2. Right ventricular systolic function is normal. The right ventricular  size is moderately enlarged. There is moderately elevated pulmonary artery  systolic pressure. The estimated right ventricular systolic pressure is  92.9 mmHg.  3. Left atrial size was severely dilated.  4. The mitral valve is normal in structure. No evidence of mitral valve  regurgitation. No evidence of mitral stenosis.  5. Tricuspid valve regurgitation is moderate.  6. The aortic valve is normal in structure. Aortic valve regurgitation is  not visualized. No aortic stenosis is present.  7. The inferior vena cava is normal in size with greater than 50%  respiratory variability, suggesting right atrial pressure of 3 mmHg.    Neuro/Psych  Neuromuscular disease (RLS) negative psych ROS   GI/Hepatic Neg liver ROS, Dysphagia, abnormal esophagram    Endo/Other  diabetes, Poorly Controlled, Type  2, Insulin Dependent, Oral Hypoglycemic AgentsObesity BMI 39 a1c 9.1  Renal/GU negative Renal ROS  negative genitourinary   Musculoskeletal  (+) Arthritis , Osteoarthritis,    Abdominal (+) + obese,   Peds  Hematology  (+) Blood dyscrasia, anemia , hct 47.8   Anesthesia Other Findings   Reproductive/Obstetrics negative OB ROS                           Anesthesia Physical Anesthesia Plan  ASA: 4  Anesthesia Plan: MAC   Post-op Pain Management:    Induction:   PONV Risk Score and Plan: 2 and Propofol infusion and TIVA  Airway Management Planned: Natural Airway and Simple Face Mask  Additional Equipment: None  Intra-op Plan:   Post-operative Plan:   Informed Consent: I have reviewed the patients History and Physical, chart, labs and discussed the procedure including the risks, benefits and alternatives for the proposed anesthesia with the patient or authorized representative who has indicated his/her understanding and acceptance.   Patient has DNR.  Discussed DNR with power of attorney and Suspend DNR.     Plan Discussed with: CRNA  Anesthesia Plan Comments: (D/w husband, permission to intubate and give IV medications if needed. Continue to withhold chest compressions and defibrillation )       Anesthesia Quick Evaluation

## 2021-02-09 NOTE — Telephone Encounter (Signed)
-----   Message from Imogene Burn, MD sent at 02/09/2021  1:29 PM EDT ----- Hi Coleta Grosshans,  Please schedule GI clinic appt with me in 1-2 months for dysphagia.  Thanks, Alan Ripper

## 2021-02-09 NOTE — Telephone Encounter (Signed)
Pt scheduled for hosp f/u 03/31/21 @ 8:30am. Appt reminder mailed to pt home address. Appt will also print on AVS after discharge for pt reminder.

## 2021-02-09 NOTE — Progress Notes (Signed)
[facility]  PROGRESS NOTE  KANETRA HO XTG:[id number] DOB: 02/21/1946 DOA: 02/03/2021 PCP: Jordan Hawks, PA-C   Brief HPI:   75 year old female with medical history of COPD, diabetes mellitus type 2, hypertension, hyperlipidemia, chronic diastolic heart failure who was admitted to [facility] on 02/03/2021 with acute hypoxemic respiratory distress after presenting from skilled nursing facility to ED for evaluation of increased somnolence.  O2 sats were 88% on room air, chest x-ray showed vascular congestion, she had diffuse wheezing and diminished air entry.  She was treated with steroids with significant improvement of her COPD.  Work-up also showed community-acquired pneumonia.  She had dysphagia with work-up significant for esophageal stricture, EGD was planned per GI.    Subjective   Patient seen and examined, plan for EGD today.   Assessment/Plan:    Acute hypoxemic respiratory failure -Secondary to COPD exacerbation/pneumonia -Treated with Rocephin and Zithromax for 5 days -Chest x-ray showed left lower opacity, pleural effusion and volume overload -Patient remains with significant upper airway congestion, she was encouraged with incentive spirometry, flutter valve, started on Mucinex  Acute on chronic diastolic heart failure -In the context of documented history of chronic diastolic heart failure -Echocardiogram from June 2021 showed grade 2 diastolic dysfunction with EF of 60 to 65% -Recurrent evidence of pulmonary edema -Was not diuresed due to hypernatremia -We will start Lasix 40 mg p.o. daily -Follow BMP in am  Dysphagia -Esophagram showed esophageal stricture -GI was consulted -Plan for EGD today  Hypernatremia -Resolved with D5W -Started on Lasix as above, follow BMP in am  MRSA UTI -Urine culture grew MRSA -Started on Zyvox for 3 days  Acute metabolic encephalopathy -Likely from UTI as above -TSH is low at 2, likely sick euthyroid  syndrome -CT head showed no acute findings  Diabetes mellitus type 2 -Hemoglobin A1c 7.8% -CBG well controlled -Continue Lantus, sliding scale insulin NovoLog  Hypertension -Blood pressure is stable -Continue Norvasc, Toprol-XL       Scheduled medications:    amLODipine  10 mg Oral Daily   aspirin EC  81 mg Oral Daily   atorvastatin  80 mg Oral QHS   enoxaparin (LOVENOX) injection  40 mg Subcutaneous Q24H   fluticasone  1 spray Each Nare Daily   guaiFENesin-dextromethorphan  5 mL Oral BID   insulin aspart  0-15 Units Subcutaneous TID WC   insulin aspart  0-5 Units Subcutaneous QHS   insulin aspart  4 Units Subcutaneous TID WC   insulin glargine-yfgn  25 Units Subcutaneous Daily   linezolid  600 mg Oral Q12H   metoprolol succinate  25 mg Oral Daily   pantoprazole  40 mg Oral Daily   saccharomyces boulardii  250 mg Oral BID   senna  2 tablet Oral Daily   sertraline  50 mg Oral Daily     Data Reviewed:   CBG:  Recent Labs  Lab 02/08/21 2217 02/09/21 0038 02/09/21 0826 02/09/21 1406 02/09/21 1619  GLUCAP 461* 314* 78 93 234*    SpO2: 94 % O2 Flow Rate (L/min): 5 L/min    Vitals:   02/09/21 1153 02/09/21 1330 02/09/21 1340 02/09/21 1349  BP: (!) 123/57 137/61 (!) 144/65 137/83  Pulse: 74 79 83 80  Resp: 20 18 (!) 21 (!) 21  Temp: (!) 96.7 F (35.9 C) 98.6 F (37 C)    TempSrc: Temporal Axillary    SpO2: 93% 100% 100% 94%  Weight: 102 kg     Height: 5' 3.5" (1.613  m)        Intake/Output Summary (Last 24 hours) at 02/09/2021 1857 Last data filed at 02/09/2021 1329 Gross per 24 hour  Intake 250 ml  Output 750 ml  Net -500 ml    10/10 1901 - 10/12 0700 In: 0  Out: 750 [Urine:750]  Filed Weights   02/07/21 0500 02/09/21 0500 02/09/21 1153  Weight: 104.8 kg 102 kg 102 kg    Data Reviewed: Basic Metabolic Panel: Recent Labs  Lab 02/04/21 0400 02/04/21 0411 02/04/21 1020 02/05/21 0207 02/05/21 1405 02/05/21 1820 02/06/21 0204  02/07/21 0148 02/08/21 0151 02/09/21 0249  NA 146*   < >  --  147*  --   --  150* 152* 141 138  K 5.6*   < >  --  5.7*   < > 5.6* 4.3 4.7 5.0 4.4  CL 106  --   --  109  --   --  106 112* 100 96*  CO2 26  --   --  28  --   --  35* 31 33* 38*  GLUCOSE 174*  --   --  252*  --   --  327* 280* 198* 227*  BUN 37*  --   --  38*  --   --  36* 35* 28* 29*  CREATININE 0.92  --   --  0.77  --   --  0.84 0.87 0.75 0.81  CALCIUM 10.1  --   --  10.1  --   --  10.4* 9.8 9.8 9.7  MG 2.3  --  2.3  --   --   --   --   --   --   --   PHOS 3.1  --   --   --   --   --   --   --   --   --    < > = values in this interval not displayed.   Liver Function Tests: Recent Labs  Lab 02/03/21 2212 02/04/21 0400  AST 31 33  ALT 58* 59*  ALKPHOS 142* 138*  BILITOT 0.4 0.6  PROT 7.2 7.4  ALBUMIN 3.1* 3.1*   No results for input(s): LIPASE, AMYLASE in the last 168 hours. Recent Labs  Lab 02/04/21 0400  AMMONIA 21   CBC: Recent Labs  Lab 02/03/21 2212 02/04/21 0400 02/04/21 0411 02/05/21 0207 02/06/21 0204 02/07/21 0548 02/08/21 0151 02/09/21 0249  WBC 12.2* 13.7*  --  12.3* 12.4* 17.4* 15.5* 16.8*  NEUTROABS 9.9* 12.7*  --   --   --   --   --   --   HGB 14.6 14.8   < > 13.6 14.2 14.4 14.4 14.1  HCT 51.4* 55.2*   < > 48.1* 49.9* 50.1* 50.6* 47.8*  MCV 89.5 94.0  --  88.6 88.6 85.8 87.5 85.7  PLT 197 134*  --  210 230 212 207 248   < > = values in this interval not displayed.   Cardiac Enzymes: No results for input(s): CKTOTAL, CKMB, CKMBINDEX, TROPONINI in the last 168 hours. BNP (last 3 results) Recent Labs    02/03/21 2122 02/05/21 0207  BNP 725.1* 417.3*    ProBNP (last 3 results) No results for input(s): PROBNP in the last 8760 hours.  CBG: Recent Labs  Lab 02/08/21 2217 02/09/21 0038 02/09/21 0826 02/09/21 1406 02/09/21 1619  GLUCAP 461* 314* 78 93 234*       Radiology Reports  DG ESOPHAGUS W SINGLE CM (SOL  OR THIN BA)  Result Date: 02/08/2021 CLINICAL DATA:   Dysphagia EXAM: ESOPHOGRAM/BARIUM SWALLOW TECHNIQUE: Single contrast examination was performed using  thin barium. FLUOROSCOPY TIME:  Fluoroscopy Time:  2 minute Radiation Exposure Index (if provided by the fluoroscopic device): 47.8 mGy Number of Acquired Spot Images: 0 COMPARISON:  None. FINDINGS: Normal pharyngeal anatomy and motility. Contrast flowed freely through the esophagus without evidence of a mass. Relative narrowing of the distal esophagus just proximal to the esophagogastric junction concerning for a stricture. Normal esophageal mucosa without evidence of irregularity or ulceration. Tertiary contractions of the esophagus as can be seen with esophageal spasm. No evidence of reflux. No definite hiatal hernia was demonstrated. IMPRESSION: Relative narrowing of the distal esophagus just proximal to the esophagogastric junction concerning for a stricture. Tertiary contractions of the esophagus as can be seen with esophageal spasm. Electronically Signed   By: Elige Ko M.D.   On: 02/08/2021 15:19       Antibiotics: Anti-infectives (From admission, onward)    Start     Dose/Rate Route Frequency Ordered Stop   02/08/21 2200  cefTRIAXone (ROCEPHIN) 2 g in sodium chloride 0.9 % 100 mL IVPB        2 g 200 mL/hr over 30 Minutes Intravenous Every 24 hours 02/08/21 1420 02/08/21 2311   02/07/21 1445  linezolid (ZYVOX) tablet 600 mg        600 mg Oral Every 12 hours 02/07/21 1355 02/10/21 0959   02/04/21 2200  azithromycin (ZITHROMAX) 500 mg in sodium chloride 0.9 % 250 mL IVPB        500 mg 250 mL/hr over 60 Minutes Intravenous Every 24 hours 02/04/21 0029 02/08/21 2359   02/04/21 2200  cefTRIAXone (ROCEPHIN) 1 g in sodium chloride 0.9 % 100 mL IVPB  Status:  Discontinued        1 g 200 mL/hr over 30 Minutes Intravenous Every 24 hours 02/04/21 0029 02/08/21 1420   02/03/21 2330  cefTRIAXone (ROCEPHIN) 2 g in sodium chloride 0.9 % 100 mL IVPB        2 g 200 mL/hr over 30 Minutes Intravenous   Once 02/03/21 2315 02/04/21 0107   02/03/21 2330  azithromycin (ZITHROMAX) 500 mg in sodium chloride 0.9 % 250 mL IVPB        500 mg 250 mL/hr over 60 Minutes Intravenous  Once 02/03/21 2315 02/04/21 0225         DVT prophylaxis: Lovenox  Code Status: Full code  Family Communication: No family at bedside   Consultants: Gastroenterology  Procedures:     Objective    Physical Examination:   General-appears in no acute distress Heart-S1-S2, regular, no murmur auscultated Lungs-decreased breath sounds at lung bases Abdomen-soft, nontender, no organomegaly Extremities-no edema in the lower extremities Neuro-alert, oriented x3, no focal deficit noted  Status is: Inpatient  Dispo: The patient is from: Home              Anticipated d/c is to: Home              Anticipated d/c date is: 02/10/2021              Patient currently not stable for discharge  Barrier to discharge-ongoing treatment for acute hypoxemic respiratory failure  COVID-19 Labs  No results for input(s): DDIMER, FERRITIN, LDH, CRP in the last 72 hours.  Lab Results  Component Value Date   SARSCOV2NAA NEGATIVE 02/03/2021   SARSCOV2NAA NEGATIVE 10/21/2019   SARSCOV2NAA NEGATIVE 10/18/2019  Pressure Injury 06/25/18 Stage II -  Partial thickness loss of dermis presenting as a shallow open ulcer with a red, pink wound bed without slough. (Active)  06/25/18 2200  Location: Sacrum  Location Orientation: Right;Medial  Staging: Stage II -  Partial thickness loss of dermis presenting as a shallow open ulcer with a red, pink wound bed without slough.  Wound Description (Comments):   Present on Admission: Yes     Pressure Injury 06/25/18 Stage II -  Partial thickness loss of dermis presenting as a shallow open ulcer with a red, pink wound bed without slough. (Active)  06/25/18 2200  Location: Sacrum  Location Orientation: Left;Medial  Staging: Stage II -  Partial thickness loss of dermis presenting  as a shallow open ulcer with a red, pink wound bed without slough.  Wound Description (Comments):   Present on Admission: Yes     Pressure Injury 06/25/18 Stage II -  Partial thickness loss of dermis presenting as a shallow open ulcer with a red, pink wound bed without slough. (Active)  06/25/18 2200  Location: Groin  Location Orientation: Right;Medial  Staging: Stage II -  Partial thickness loss of dermis presenting as a shallow open ulcer with a red, pink wound bed without slough.  Wound Description (Comments):   Present on Admission: Yes        Recent Results (from the past 240 hour(s))  Blood Culture (routine x 2)     Status: None   Collection Time: 02/03/21 10:12 PM   Specimen: BLOOD RIGHT HAND  Result Value Ref Range Status   Specimen Description BLOOD RIGHT HAND  Final   Special Requests   Final    BOTTLES DRAWN AEROBIC AND ANAEROBIC Blood Culture adequate volume   Culture   Final    NO GROWTH 5 DAYS Performed at [facility], [address] [place], [place] [address]    Report Status 02/08/2021 FINAL  Final  Blood Culture (routine x 2)     Status: Abnormal   Collection Time: 02/03/21 10:24 PM   Specimen: BLOOD  Result Value Ref Range Status   Specimen Description BLOOD BLOOD RIGHT FOREARM  Final   Special Requests   Final    BOTTLES DRAWN AEROBIC AND ANAEROBIC Blood Culture results may not be optimal due to an inadequate volume of blood received in culture bottles   Culture  Setup Time   Final    GRAM POSITIVE COCCI IN CLUSTERS ANAEROBIC BOTTLE ONLY CRITICAL RESULT CALLED TO, READ BACK BY AND VERIFIED WITH: PHARMD JAMES LEDFORD 02/04/21@21 :46 BY TW    Culture (A)  Final    STAPHYLOCOCCUS HOMINIS THE SIGNIFICANCE OF ISOLATING THIS ORGANISM FROM A SINGLE SET OF BLOOD CULTURES WHEN MULTIPLE SETS ARE DRAWN IS UNCERTAIN. PLEASE NOTIFY THE MICROBIOLOGY DEPARTMENT WITHIN ONE WEEK IF SPECIATION AND SENSITIVITIES ARE REQUIRED. Performed at [facility],  [address], [place], [place] [address]    Report Status 02/06/2021 FINAL  Final  Resp Panel by RT-PCR (Flu A&B, Covid) Nasopharyngeal Swab     Status: None   Collection Time: 02/03/21 10:24 PM   Specimen: Nasopharyngeal Swab; Nasopharyngeal(NP) swabs in vial transport medium  Result Value Ref Range Status   SARS Coronavirus 2 by RT PCR NEGATIVE NEGATIVE Final    Comment: (NOTE) SARS-CoV-2 target nucleic acids are NOT DETECTED.  The SARS-CoV-2 RNA is generally detectable in upper respiratory specimens during the acute phase of infection. The lowest concentration of SARS-CoV-2 viral copies this assay can  detect is 138 copies/mL. A negative result does not preclude SARS-Cov-2 infection and should not be used as the sole basis for treatment or other patient management decisions. A negative result may occur with  improper specimen collection/handling, submission of specimen other than nasopharyngeal swab, presence of viral mutation(s) within the areas targeted by this assay, and inadequate number of viral copies(<138 copies/mL). A negative result must be combined with clinical observations, patient history, and epidemiological information. The expected result is Negative.  Fact Sheet for Patients:  BloggerCourse.com  Fact Sheet for Healthcare Providers:  SeriousBroker.it  This test is no t yet approved or cleared by the [place] FDA and  has been authorized for detection and/or diagnosis of SARS-CoV-2 by FDA under an Emergency Use Authorization (EUA). This EUA will remain  in effect (meaning this test can be used) for the duration of the COVID-19 declaration under Section 564(b)(1) of the Act, 21 U.S.C.section 360bbb-3(b)(1), unless the authorization is terminated  or revoked sooner.       Influenza A by PCR NEGATIVE NEGATIVE Final   Influenza B by PCR NEGATIVE NEGATIVE Final    Comment: (NOTE) The Xpert Xpress  SARS-CoV-2/FLU/RSV plus assay is intended as an aid in the diagnosis of influenza from Nasopharyngeal swab specimens and should not be used as a sole basis for treatment. Nasal washings and aspirates are unacceptable for Xpert Xpress SARS-CoV-2/FLU/RSV testing.  Fact Sheet for Patients: BloggerCourse.com  Fact Sheet for Healthcare Providers: SeriousBroker.it  This test is not yet approved or cleared by the [place] FDA and has been authorized for detection and/or diagnosis of SARS-CoV-2 by FDA under an Emergency Use Authorization (EUA). This EUA will remain in effect (meaning this test can be used) for the duration of the COVID-19 declaration under Section 564(b)(1) of the Act, 21 U.S.C. section 360bbb-3(b)(1), unless the authorization is terminated or revoked.  Performed at [facility], [address], [place], [place] [address]   Blood Culture ID Panel (Reflexed)     Status: Abnormal   Collection Time: 02/03/21 10:24 PM  Result Value Ref Range Status   Enterococcus faecalis NOT DETECTED NOT DETECTED Final   Enterococcus Faecium NOT DETECTED NOT DETECTED Final   Listeria monocytogenes NOT DETECTED NOT DETECTED Final   Staphylococcus species DETECTED (A) NOT DETECTED Final    Comment: CRITICAL RESULT CALLED TO, READ BACK BY AND VERIFIED WITH: PHARMD JAMES LEDFORD 02/04/21@21 :46 BY TW    Staphylococcus aureus (BCID) NOT DETECTED NOT DETECTED Final   Staphylococcus epidermidis NOT DETECTED NOT DETECTED Final   Staphylococcus lugdunensis NOT DETECTED NOT DETECTED Final   Streptococcus species NOT DETECTED NOT DETECTED Final   Streptococcus agalactiae NOT DETECTED NOT DETECTED Final   Streptococcus pneumoniae NOT DETECTED NOT DETECTED Final   Streptococcus pyogenes NOT DETECTED NOT DETECTED Final   A.calcoaceticus-baumannii NOT DETECTED NOT DETECTED Final   Bacteroides fragilis NOT DETECTED NOT DETECTED Final    Enterobacterales NOT DETECTED NOT DETECTED Final   Enterobacter cloacae complex NOT DETECTED NOT DETECTED Final   Escherichia coli NOT DETECTED NOT DETECTED Final   Klebsiella aerogenes NOT DETECTED NOT DETECTED Final   Klebsiella oxytoca NOT DETECTED NOT DETECTED Final   Klebsiella pneumoniae NOT DETECTED NOT DETECTED Final   Proteus species NOT DETECTED NOT DETECTED Final   Salmonella species NOT DETECTED NOT DETECTED Final   Serratia marcescens NOT DETECTED NOT DETECTED Final   Haemophilus influenzae NOT DETECTED NOT DETECTED Final   Neisseria meningitidis NOT DETECTED NOT DETECTED Final  Pseudomonas aeruginosa NOT DETECTED NOT DETECTED Final   Stenotrophomonas maltophilia NOT DETECTED NOT DETECTED Final   Candida albicans NOT DETECTED NOT DETECTED Final   Candida auris NOT DETECTED NOT DETECTED Final   Candida glabrata NOT DETECTED NOT DETECTED Final   Candida krusei NOT DETECTED NOT DETECTED Final   Candida parapsilosis NOT DETECTED NOT DETECTED Final   Candida tropicalis NOT DETECTED NOT DETECTED Final   Cryptococcus neoformans/gattii NOT DETECTED NOT DETECTED Final    Comment: Performed at [facility], [address], [place], [place] [address]  Urine Culture     Status: Abnormal   Collection Time: 02/04/21  6:30 AM   Specimen: In/Out Cath Urine  Result Value Ref Range Status   Specimen Description IN/OUT CATH URINE  Final   Special Requests   Final    NONE Performed at [facility], [address] [place], [place] [address]    Culture (A)  Final    40,000 COLONIES/mL METHICILLIN RESISTANT STAPHYLOCOCCUS AUREUS   Report Status 02/06/2021 FINAL  Final   Organism ID, Bacteria METHICILLIN RESISTANT STAPHYLOCOCCUS AUREUS (A)  Final      Susceptibility   Methicillin resistant staphylococcus aureus - MIC*    CIPROFLOXACIN >=8 RESISTANT Resistant     GENTAMICIN <=0.5 SENSITIVE Sensitive     NITROFURANTOIN <=16 SENSITIVE Sensitive     OXACILLIN >=4 RESISTANT  Resistant     TETRACYCLINE <=1 SENSITIVE Sensitive     VANCOMYCIN 1 SENSITIVE Sensitive     TRIMETH/SULFA >=320 RESISTANT Resistant     CLINDAMYCIN <=0.25 SENSITIVE Sensitive     RIFAMPIN <=0.5 SENSITIVE Sensitive     Inducible Clindamycin NEGATIVE Sensitive     * 40,000 COLONIES/mL METHICILLIN RESISTANT STAPHYLOCOCCUS AUREUS    Cassandria Drew S Anarely Nicholls   [facility] If 7PM-7AM, please contact night-coverage at [email or web address], Office  [phone]   02/09/2021, 6:57 PM  LOS: 6 days

## 2021-02-09 NOTE — TOC Progression Note (Signed)
Transition of Care [facility]) - Progression Note    Patient Details  Name: Courtney [name] MRN: [id number] Date of Birth: 01-Mar-1946  [facility] CM/SW Contact  Lorri Frederick, LCSW Phone Number: 02/09/2021, 11:16 AM  Clinical Narrative:   CSW spoke with husband Courtney [name] who confirmed that they would like for pt to return to [place] at discharge.  He had spoken to MD and received medical update.      Expected Discharge Plan: Long Term Nursing Home Barriers to Discharge: Continued Medical Work up  Expected Discharge Plan and Services Expected Discharge Plan: Long Term Nursing Home     Post Acute Care Choice: Skilled Nursing Facility Living arrangements for the past 2 months: Skilled Nursing Facility                                       Social Determinants of Health (SDOH) Interventions    Readmission Risk Interventions No flowsheet data found.

## 2021-02-09 NOTE — Anesthesia Postprocedure Evaluation (Signed)
Anesthesia Post Note  Patient: Courtney [name]  Procedure(s) Performed: ESOPHAGOGASTRODUODENOSCOPY (EGD) WITH PROPOFOL BIOPSY     Patient location during evaluation: PACU Anesthesia Type: MAC Level of consciousness: awake and alert Pain management: pain level controlled Vital Signs Assessment: post-procedure vital signs reviewed and stable Respiratory status: spontaneous breathing, nonlabored ventilation and respiratory function stable Cardiovascular status: blood pressure returned to baseline and stable Postop Assessment: no apparent nausea or vomiting Anesthetic complications: no   No notable events documented.  Last Vitals:  Vitals:   02/09/21 1330 02/09/21 1340  BP: 137/61 (!) 144/65  Pulse: 79 83  Resp: 18 (!) 21  Temp: 37 C   SpO2: 100% 100%    Last Pain:  Vitals:   02/09/21 1340  TempSrc:   PainSc: 0-No pain                 Pervis Hocking

## 2021-02-09 NOTE — Progress Notes (Signed)
Occupational Therapy Treatment Patient Details Name: Courtney [name] MRN: [id number] DOB: 02-22-1946 Today's Date: 02/09/2021   History of present illness Pt is a 75 y.o. F who presents with acute hypoxic respiratory distress due to COPD exacerbation. Significant PMH: COPD, type 2 diabetes mellitus, HTN, chronic diastolic heart failure.   OT comments  Pt supine in bed and agreeable to OT session. Pt confirms she feeds herself, but then voices "[name] my husband has been helping me since I've been sick". Simulated self feeding (as NPO for procedure), with setup assist.  Engaged in BUE AROM strengthening exercises, 10 reps 2 sets of shoulder and elbow flexion/extension-- fatigues easily and requires rest breaks.  Encouraged pt to attempt self feeding when cleared, and educated Charity fundraiser as [name].  Will follow acutely.    Recommendations for follow up therapy are one component of a multi-disciplinary discharge planning process, led by the attending physician.  Recommendations may be updated based on patient status, additional functional criteria and insurance authorization.    Follow Up Recommendations  SNF;Supervision/Assistance - 24 hour (return to [facility])    Equipment Recommendations  None recommended by OT    Recommendations for Other Services      Precautions / Restrictions Precautions Precautions: Fall Restrictions Weight Bearing Restrictions: No       Mobility Bed Mobility Overal bed mobility: Needs Assistance Bed Mobility: Rolling Rolling: Mod assist         General bed mobility comments: rolling to in bed with mod assist (pt asking therapist to look for her cell phone)    Transfers                      Balance                                           ADL either performed or assessed with clinical judgement   ADL Overall ADL's : Needs assistance/impaired   Eating/Feeding Details (indicate cue type and reason): simulated using empty cup  (as NPO for procedure) with R UE and setup assist Grooming: Set up;Bed level;Wash/dry hands;Wash/dry Surveyor, minerals      Cognition Arousal/Alertness: Awake/alert Behavior During Therapy: Flat affect Overall Cognitive Status: No family/caregiver present to determine baseline cognitive functioning Area of Impairment: Orientation;Problem solving;Awareness;Following commands                 Orientation Level: Disoriented to;Place     Following Commands: Follows one step commands consistently;Follows one step commands with increased time;Follows multi-step commands inconsistently   Awareness: Emergent Problem Solving: Requires verbal cues General Comments: patient alert and pleasant today, follows commands with increased time.  She reports she is at [facility], but otherwise oriented. She is aware of upcoming procedure today.        Exercises Exercises: General Upper Extremity General Exercises - Upper Extremity Shoulder Flexion: AROM;Both;10 reps;Supine Elbow Flexion: AROM;Both;10 reps;Supine Elbow Extension: AROM;Both;10 reps;Supine   Shoulder Instructions       General Comments      Pertinent Vitals/ Pain       Pain Assessment: No/denies pain  Home Living                                          Prior Functioning/Environment              Frequency  Min 2X/week        Progress Toward Goals  OT Goals(current goals can now be found in the care plan section)  Progress towards OT goals: Progressing toward goals  Acute Rehab OT Goals Patient Stated Goal: to feed myself again  Plan Discharge plan remains appropriate;Frequency remains appropriate    Co-evaluation                 AM-PAC OT "6 Clicks" Daily Activity     Outcome Measure   Help from another person eating meals?: Total (NPO) Help from another person taking care of personal  grooming?: A Little Help from another person toileting, which includes using toliet, bedpan, or urinal?: Total Help from another person bathing (including washing, rinsing, drying)?: A Little Help from another person to put on and taking off regular upper body clothing?: A Little Help from another person to put on and taking off regular lower body clothing?: Total 6 Click Score: 12    End of Session    OT Visit Diagnosis: Other symptoms and signs involving cognitive function;Muscle weakness (generalized) (M62.81)   Activity Tolerance Patient tolerated treatment [name]   Patient Left in bed;with call bell/phone within reach   Nurse Communication Mobility status;Other (comment) (transport arrived)        Time: 1062-6948 OT Time Calculation (min): 16 min  Charges: OT General Charges $OT Visit: 1 Visit OT Treatments $Self Care/Home Management : 8-22 mins  Barry Brunner, OT Acute Rehabilitation Services Pager [phone] Office [phone]   [name] 02/09/2021, 12:23 PM

## 2021-02-10 ENCOUNTER — Encounter (HOSPITAL_COMMUNITY): Payer: Self-pay | Admitting: Internal Medicine

## 2021-02-10 LAB — GLUCOSE, CAPILLARY
Glucose-Capillary: 177 mg/dL — ABNORMAL HIGH (ref 70–99)
Glucose-Capillary: 151 mg/dL — ABNORMAL HIGH (ref 70–99)
Glucose-Capillary: 311 mg/dL — ABNORMAL HIGH (ref 70–99)
Glucose-Capillary: 389 mg/dL — ABNORMAL HIGH (ref 70–99)

## 2021-02-10 LAB — BASIC METABOLIC PANEL
Anion gap: 10 (ref 5–15)
BUN: 21 mg/dL (ref 8–23)
CO2: 33 mmol/L — ABNORMAL HIGH (ref 22–32)
Calcium: 9.6 mg/dL (ref 8.9–10.3)
Chloride: 95 mmol/L — ABNORMAL LOW (ref 98–111)
Creatinine, Ser: 0.79 mg/dL (ref 0.44–1.00)
GFR, Estimated: >60 mL/min (ref >60)
Glucose, Bld: 206 mg/dL — ABNORMAL HIGH (ref 70–99)
Potassium: 4.4 mmol/L (ref 3.5–5.1)
Sodium: 138 mmol/L (ref 135–145)

## 2021-02-10 LAB — CBC
HCT: 48.1 % — ABNORMAL HIGH (ref 36.0–46.0)
Hemoglobin: 14 g/dL (ref 12.0–15.0)
MCH: 25 pg — ABNORMAL LOW (ref 26.0–34.0)
MCHC: 29.1 g/dL — ABNORMAL LOW (ref 30.0–36.0)
MCV: 85.7 fL (ref 80.0–100.0)
Platelets: 131 10*3/uL — ABNORMAL LOW (ref 150–400)
RBC: 5.61 MIL/uL — ABNORMAL HIGH (ref 3.87–5.11)
RDW: 15.6 % — ABNORMAL HIGH (ref 11.5–15.5)
WBC: 12.6 10*3/uL — ABNORMAL HIGH (ref 4.0–10.5)
nRBC: 0 % (ref 0.0–0.2)

## 2021-02-10 LAB — SURGICAL PATHOLOGY

## 2021-02-10 MED ORDER — IPRATROPIUM-ALBUTEROL 0.5-2.5 (3) MG/3ML IN SOLN: 3.0000 mL | Freq: Four times a day (QID) | RESPIRATORY_TRACT | Status: DC

## 2021-02-10 MED ORDER — IPRATROPIUM-ALBUTEROL 0.5-2.5 (3) MG/3ML IN SOLN
3.0000 mL | Freq: Four times a day (QID) | RESPIRATORY_TRACT | Status: DC
  Administered 2021-02-10 – 2021-02-13 (×12): 3 mL via RESPIRATORY_TRACT
  Filled 2021-02-10 (×12): qty 3

## 2021-02-10 MED ORDER — GUAIFENESIN ER 600 MG PO TB12
600.0000 mg | ORAL_TABLET | Freq: Two times a day (BID) | ORAL | Status: DC
  Administered 2021-02-10 (×2): 600 mg via ORAL
  Filled 2021-02-10 (×2): qty 1

## 2021-02-10 NOTE — Progress Notes (Signed)
Speech Language Pathology Treatment: Dysphagia  Patient Details Name: Courtney [name] MRN: [id number] DOB: Sep 15, 1945 Today's Date: 02/10/2021 Time: 1440-1500 SLP Time Calculation (min) (ACUTE ONLY): 20 min  Assessment / Plan / Recommendation Clinical Impression  Pt demonstrates improved alertness today than in any prior session. Pt was started back on mechanical soft diet rather than puree after esophagram, but she reports tolerating this well. Initially SLP observed pt with purees without any liquids given, which resulted in intermittent coughing. Pt and husband deny difficulty however. Pt reports she enjoyed her lunch. Will maintain diet, though suspect there will be ongoing coughing and concern for pts tolerance. A downgrade to puree has not decreased frequency of coughing so it may be unnecessary to restrict pt.   HPI HPI: Courtney [name] is a 75 y.o. female with medical history significant for COPD, type 2 diabetes mellitus, hypertension, hyperlipidemia, chronic diastolic heart failure, who is admitted to [facility] on 02/03/2021 with acute hypoxic respiratory distress after presenting from snf to [facility] ED for evaluation of increased somnolence.  ED work-up significant for temperature of 99.3, oxygen saturation 88% on room air, chest x-ray significant for vascular congestion, she is with diffuse wheezing and diminished air entry, admitted for further management.Pt coughing after [facility].      SLP Plan  Continue with current plan of care      Recommendations for follow up therapy are one component of a multi-disciplinary discharge planning process, led by the attending physician.  Recommendations may be updated based on patient status, additional functional criteria and insurance authorization.    Recommendations  Diet recommendations: Dysphagia 3 (mechanical soft);Thin liquid Liquids provided via: Cup;Straw Medication Administration: Crushed with puree Supervision: Staff to assist  with self feeding;Full supervision/cueing for compensatory strategies Compensations: Slow rate;Small sips/bites Postural Changes and/or Swallow Maneuvers: Seated upright 90 degrees                Oral Care Recommendations: Oral care BID Follow up Recommendations: Skilled Nursing facility Plan: Continue with current plan of care       GO                Ranae Casebier, Riley Nearing  02/10/2021, 3:47 PM

## 2021-02-10 NOTE — Progress Notes (Signed)
Brief GI Path Note: Esophageal biopsies showed squamocolumnar mucosa without signs of metaplasia/dysplasia/malignancy, and stomach biopsies were negative for H pylori. I do not feel that these findings warrant further endoscopic surveillance. GI follow up has already been scheduled.  Courtney [name]

## 2021-02-10 NOTE — Progress Notes (Signed)
[facility]  PROGRESS NOTE  Courtney [name] TKZ:[id number] DOB: 10/18/1945 DOA: 02/03/2021 PCP: Jordan Hawks, PA-C   Brief HPI:   75 year old female with medical history of COPD, diabetes mellitus type 2, hypertension, hyperlipidemia, chronic diastolic heart failure who was admitted to [facility] on 02/03/2021 with acute hypoxemic respiratory distress after presenting from skilled nursing facility to ED for evaluation of increased somnolence.  O2 sats were 88% on room air, chest x-ray showed vascular congestion, she had diffuse wheezing and diminished air entry.  She was treated with steroids with significant improvement of her COPD.  Work-up also showed community-acquired pneumonia.  She had dysphagia with work-up significant for esophageal stricture, EGD was planned per GI.    Subjective   Patient seen and examined, still has wheezing.  Underwent EGD yesterday which showed Barrett's esophagus, biopsy was obtained and patient started on PPI twice daily.   Assessment/Plan:    Acute hypoxemic respiratory failure -Secondary to COPD exacerbation/pneumonia -Treated with Rocephin and Zithromax for 5 days -Chest x-ray showed left lower opacity, pleural effusion and volume overload -Patient remains with significant upper airway congestion, she was encouraged with incentive spirometry, flutter valve, started on Mucinex -We will start DuoNeb nebulizers every 6 hours  Acute on chronic diastolic heart failure -In the context of documented history of chronic diastolic heart failure -Echocardiogram from June 2021 showed grade 2 diastolic dysfunction with EF of 60 to 65% -Recurrent evidence of pulmonary edema -Was not diuresed due to hypernatremia Started on Lasix 40 mg p.o. daily -Follow BMP in am  Dysphagia -Esophagram showed esophageal stricture -Underwent EGD yesterday which showed Barrett's esophagus -Biopsy was obtained, patient will follow up with GI as outpatient -May  benefit for esophageal manometry, GI to discuss as outpatient -Started on twice daily PPI for 8 weeks   Hypernatremia -Resolved with D5W -Started on Lasix as above, follow BMP in am  MRSA UTI -Urine culture grew MRSA -Started on Zyvox for 3 days  Acute metabolic encephalopathy -Likely from UTI as above -TSH is low at 2, likely sick euthyroid syndrome -CT head showed no acute findings  Diabetes mellitus type 2 -Hemoglobin A1c 7.8% -CBG well controlled -Continue Lantus, sliding scale insulin NovoLog  Hypertension -Blood pressure is stable -Continue Norvasc, Toprol-XL     Scheduled medications:    amLODipine  10 mg Oral Daily   aspirin EC  81 mg Oral Daily   atorvastatin  80 mg Oral QHS   enoxaparin (LOVENOX) injection  40 mg Subcutaneous Q24H   fluticasone  1 spray Each Nare Daily   furosemide  40 mg Oral Daily   guaiFENesin  600 mg Oral BID   insulin aspart  0-15 Units Subcutaneous TID WC   insulin aspart  0-5 Units Subcutaneous QHS   insulin aspart  4 Units Subcutaneous TID WC   insulin glargine-yfgn  25 Units Subcutaneous Daily   ipratropium-albuterol  3 mL Nebulization Q6H   metoprolol succinate  25 mg Oral Daily   pantoprazole  40 mg Oral Daily   saccharomyces boulardii  250 mg Oral BID   senna  2 tablet Oral Daily   sertraline  50 mg Oral Daily     Data Reviewed:   CBG:  Recent Labs  Lab 02/09/21 1406 02/09/21 1619 02/09/21 2017 02/10/21 0727 02/10/21 1135  GLUCAP 93 234* 270* 177* 151*    SpO2: 100 % O2 Flow Rate (L/min): 2 L/min    Vitals:   02/10/21 0800 02/10/21 1054 02/10/21 1139 02/10/21  1334  BP: (!) 156/57 132/73 (!) 152/83   Pulse: 74 71 75   Resp: 14  19   Temp: 98.1 F (36.7 C)     TempSrc: Oral     SpO2:   98% 100%  Weight:      Height:         Intake/Output Summary (Last 24 hours) at 02/10/2021 1341 Last data filed at 02/10/2021 0815 Gross per 24 hour  Intake 480 ml  Output 775 ml  Net -295 ml    10/11 1901 -  10/13 0700 In: 730 [P.O.:480; I.V.:250] Out: 1450 [Urine:1450]  Filed Weights   02/09/21 0500 02/09/21 1153 02/10/21 0500  Weight: 102 kg 102 kg 107 kg    Data Reviewed: Basic Metabolic Panel: Recent Labs  Lab 02/04/21 0400 02/04/21 0411 02/04/21 1020 02/05/21 0207 02/06/21 0204 02/07/21 0148 02/08/21 0151 02/09/21 0249 02/10/21 0433  NA 146*   < >  --    < > 150* 152* 141 138 138  K 5.6*   < >  --    < > 4.3 4.7 5.0 4.4 4.4  CL 106  --   --    < > 106 112* 100 96* 95*  CO2 26  --   --    < > 35* 31 33* 38* 33*  GLUCOSE 174*  --   --    < > 327* 280* 198* 227* 206*  BUN 37*  --   --    < > 36* 35* 28* 29* 21  CREATININE 0.92  --   --    < > 0.84 0.87 0.75 0.81 0.79  CALCIUM 10.1  --   --    < > 10.4* 9.8 9.8 9.7 9.6  MG 2.3  --  2.3  --   --   --   --   --   --   PHOS 3.1  --   --   --   --   --   --   --   --    < > = values in this interval not displayed.   Liver Function Tests: Recent Labs  Lab 02/03/21 2212 02/04/21 0400  AST 31 33  ALT 58* 59*  ALKPHOS 142* 138*  BILITOT 0.4 0.6  PROT 7.2 7.4  ALBUMIN 3.1* 3.1*   No results for input(s): LIPASE, AMYLASE in the last 168 hours. Recent Labs  Lab 02/04/21 0400  AMMONIA 21   CBC: Recent Labs  Lab 02/03/21 2212 02/04/21 0400 02/04/21 0411 02/06/21 0204 02/07/21 0548 02/08/21 0151 02/09/21 0249 02/10/21 0433  WBC 12.2* 13.7*   < > 12.4* 17.4* 15.5* 16.8* 12.6*  NEUTROABS 9.9* 12.7*  --   --   --   --   --   --   HGB 14.6 14.8   < > 14.2 14.4 14.4 14.1 14.0  HCT 51.4* 55.2*   < > 49.9* 50.1* 50.6* 47.8* 48.1*  MCV 89.5 94.0   < > 88.6 85.8 87.5 85.7 85.7  PLT 197 134*   < > 230 212 207 248 131*   < > = values in this interval not displayed.   Cardiac Enzymes: No results for input(s): CKTOTAL, CKMB, CKMBINDEX, TROPONINI in the last 168 hours. BNP (last 3 results) Recent Labs    02/03/21 2122 02/05/21 0207  BNP 725.1* 417.3*    ProBNP (last 3 results) No results for input(s): PROBNP in the  last 8760 hours.  CBG: Recent Labs  Lab  02/09/21 1406 02/09/21 1619 02/09/21 2017 02/10/21 0727 02/10/21 1135  GLUCAP 93 234* 270* 177* 151*       Radiology Reports  DG ESOPHAGUS W SINGLE CM (SOL OR THIN BA)  Result Date: 02/08/2021 CLINICAL DATA:  Dysphagia EXAM: ESOPHOGRAM/BARIUM SWALLOW TECHNIQUE: Single contrast examination was performed using  thin barium. FLUOROSCOPY TIME:  Fluoroscopy Time:  2 minute Radiation Exposure Index (if provided by the fluoroscopic device): 47.8 mGy Number of Acquired Spot Images: 0 COMPARISON:  None. FINDINGS: Normal pharyngeal anatomy and motility. Contrast flowed freely through the esophagus without evidence of a mass. Relative narrowing of the distal esophagus just proximal to the esophagogastric junction concerning for a stricture. Normal esophageal mucosa without evidence of irregularity or ulceration. Tertiary contractions of the esophagus as can be seen with esophageal spasm. No evidence of reflux. No definite hiatal hernia was demonstrated. IMPRESSION: Relative narrowing of the distal esophagus just proximal to the esophagogastric junction concerning for a stricture. Tertiary contractions of the esophagus as can be seen with esophageal spasm. Electronically Signed   By: Elige Ko M.D.   On: 02/08/2021 15:19       Antibiotics: Anti-infectives (From admission, onward)    Start     Dose/Rate Route Frequency Ordered Stop   02/08/21 2200  cefTRIAXone (ROCEPHIN) 2 g in sodium chloride 0.9 % 100 mL IVPB        2 g 200 mL/hr over 30 Minutes Intravenous Every 24 hours 02/08/21 1420 02/08/21 2311   02/07/21 1445  linezolid (ZYVOX) tablet 600 mg        600 mg Oral Every 12 hours 02/07/21 1355 02/10/21 0959   02/04/21 2200  azithromycin (ZITHROMAX) 500 mg in sodium chloride 0.9 % 250 mL IVPB        500 mg 250 mL/hr over 60 Minutes Intravenous Every 24 hours 02/04/21 0029 02/08/21 2359   02/04/21 2200  cefTRIAXone (ROCEPHIN) 1 g in sodium chloride  0.9 % 100 mL IVPB  Status:  Discontinued        1 g 200 mL/hr over 30 Minutes Intravenous Every 24 hours 02/04/21 0029 02/08/21 1420   02/03/21 2330  cefTRIAXone (ROCEPHIN) 2 g in sodium chloride 0.9 % 100 mL IVPB        2 g 200 mL/hr over 30 Minutes Intravenous  Once 02/03/21 2315 02/04/21 0107   02/03/21 2330  azithromycin (ZITHROMAX) 500 mg in sodium chloride 0.9 % 250 mL IVPB        500 mg 250 mL/hr over 60 Minutes Intravenous  Once 02/03/21 2315 02/04/21 0225         DVT prophylaxis: Lovenox  Code Status: Full code  Family Communication: No family at bedside   Consultants: Gastroenterology  Procedures:     Objective    Physical Examination:   General-appears in no acute distress Heart-S1-S2, regular, no murmur auscultated Lungs-bilateral wheezing auscultated Abdomen-soft, nontender, no organomegaly Extremities-no edema in the lower extremities Neuro-alert, oriented x3, no focal deficit noted  Status is: Inpatient  Dispo: The patient is from: Home              Anticipated d/c is to: Home              Anticipated d/c date is: 02/11/2021              Patient currently not stable for discharge  Barrier to discharge-ongoing treatment for acute hypoxemic respiratory failure  COVID-19 Labs  No results for input(s): DDIMER, FERRITIN, LDH, CRP in  the last 72 hours.  Lab Results  Component Value Date   SARSCOV2NAA NEGATIVE 02/03/2021   SARSCOV2NAA NEGATIVE 10/21/2019   SARSCOV2NAA NEGATIVE 10/18/2019     Pressure Injury 06/25/18 Stage II -  Partial thickness loss of dermis presenting as a shallow open ulcer with a red, pink wound bed without slough. (Active)  06/25/18 2200  Location: Sacrum  Location Orientation: Right;Medial  Staging: Stage II -  Partial thickness loss of dermis presenting as a shallow open ulcer with a red, pink wound bed without slough.  Wound Description (Comments):   Present on Admission: Yes     Pressure Injury 06/25/18 Stage  II -  Partial thickness loss of dermis presenting as a shallow open ulcer with a red, pink wound bed without slough. (Active)  06/25/18 2200  Location: Sacrum  Location Orientation: Left;Medial  Staging: Stage II -  Partial thickness loss of dermis presenting as a shallow open ulcer with a red, pink wound bed without slough.  Wound Description (Comments):   Present on Admission: Yes     Pressure Injury 06/25/18 Stage II -  Partial thickness loss of dermis presenting as a shallow open ulcer with a red, pink wound bed without slough. (Active)  06/25/18 2200  Location: Groin  Location Orientation: Right;Medial  Staging: Stage II -  Partial thickness loss of dermis presenting as a shallow open ulcer with a red, pink wound bed without slough.  Wound Description (Comments):   Present on Admission: Yes        Recent Results (from the past 240 hour(s))  Blood Culture (routine x 2)     Status: None   Collection Time: 02/03/21 10:12 PM   Specimen: BLOOD RIGHT HAND  Result Value Ref Range Status   Specimen Description BLOOD RIGHT HAND  Final   Special Requests   Final    BOTTLES DRAWN AEROBIC AND ANAEROBIC Blood Culture adequate volume   Culture   Final    NO GROWTH 5 DAYS Performed at [facility], [address] [place], [place] [address]    Report Status 02/08/2021 FINAL  Final  Blood Culture (routine x 2)     Status: Abnormal   Collection Time: 02/03/21 10:24 PM   Specimen: BLOOD  Result Value Ref Range Status   Specimen Description BLOOD BLOOD RIGHT FOREARM  Final   Special Requests   Final    BOTTLES DRAWN AEROBIC AND ANAEROBIC Blood Culture results may not be optimal due to an inadequate volume of blood received in culture bottles   Culture  Setup Time   Final    GRAM POSITIVE COCCI IN CLUSTERS ANAEROBIC BOTTLE ONLY CRITICAL RESULT CALLED TO, READ BACK BY AND VERIFIED WITH: PHARMD JAMES LEDFORD 02/04/21@21 :46 BY TW    Culture (A)  Final    STAPHYLOCOCCUS HOMINIS THE  SIGNIFICANCE OF ISOLATING THIS ORGANISM FROM A SINGLE SET OF BLOOD CULTURES WHEN MULTIPLE SETS ARE DRAWN IS UNCERTAIN. PLEASE NOTIFY THE MICROBIOLOGY DEPARTMENT WITHIN ONE WEEK IF SPECIATION AND SENSITIVITIES ARE REQUIRED. Performed at [facility], [address], [place], [place] [address]    Report Status 02/06/2021 FINAL  Final  Resp Panel by RT-PCR (Flu A&B, Covid) Nasopharyngeal Swab     Status: None   Collection Time: 02/03/21 10:24 PM   Specimen: Nasopharyngeal Swab; Nasopharyngeal(NP) swabs in vial transport medium  Result Value Ref Range Status   SARS Coronavirus 2 by RT PCR NEGATIVE NEGATIVE Final    Comment: (NOTE) SARS-CoV-2 target nucleic acids  are NOT DETECTED.  The SARS-CoV-2 RNA is generally detectable in upper respiratory specimens during the acute phase of infection. The lowest concentration of SARS-CoV-2 viral copies this assay can detect is 138 copies/mL. A negative result does not preclude SARS-Cov-2 infection and should not be used as the sole basis for treatment or other patient management decisions. A negative result may occur with  improper specimen collection/handling, submission of specimen other than nasopharyngeal swab, presence of viral mutation(s) within the areas targeted by this assay, and inadequate number of viral copies(<138 copies/mL). A negative result must be combined with clinical observations, patient history, and epidemiological information. The expected result is Negative.  Fact Sheet for Patients:  BloggerCourse.com  Fact Sheet for Healthcare Providers:  SeriousBroker.it  This test is no t yet approved or cleared by the [place] FDA and  has been authorized for detection and/or diagnosis of SARS-CoV-2 by FDA under an Emergency Use Authorization (EUA). This EUA will remain  in effect (meaning this test can be used) for the duration of the COVID-19 declaration under Section  564(b)(1) of the Act, 21 U.S.C.section 360bbb-3(b)(1), unless the authorization is terminated  or revoked sooner.       Influenza A by PCR NEGATIVE NEGATIVE Final   Influenza B by PCR NEGATIVE NEGATIVE Final    Comment: (NOTE) The Xpert Xpress SARS-CoV-2/FLU/RSV plus assay is intended as an aid in the diagnosis of influenza from Nasopharyngeal swab specimens and should not be used as a sole basis for treatment. Nasal washings and aspirates are unacceptable for Xpert Xpress SARS-CoV-2/FLU/RSV testing.  Fact Sheet for Patients: BloggerCourse.com  Fact Sheet for Healthcare Providers: SeriousBroker.it  This test is not yet approved or cleared by the [place] FDA and has been authorized for detection and/or diagnosis of SARS-CoV-2 by FDA under an Emergency Use Authorization (EUA). This EUA will remain in effect (meaning this test can be used) for the duration of the COVID-19 declaration under Section 564(b)(1) of the Act, 21 U.S.C. section 360bbb-3(b)(1), unless the authorization is terminated or revoked.  Performed at [facility], [address] [place], [place] [address]   Blood Culture ID Panel (Reflexed)     Status: Abnormal   Collection Time: 02/03/21 10:24 PM  Result Value Ref Range Status   Enterococcus faecalis NOT DETECTED NOT DETECTED Final   Enterococcus Faecium NOT DETECTED NOT DETECTED Final   Listeria monocytogenes NOT DETECTED NOT DETECTED Final   Staphylococcus species DETECTED (A) NOT DETECTED Final    Comment: CRITICAL RESULT CALLED TO, READ BACK BY AND VERIFIED WITH: PHARMD JAMES LEDFORD 02/04/21@21 :46 BY TW    Staphylococcus aureus (BCID) NOT DETECTED NOT DETECTED Final   Staphylococcus epidermidis NOT DETECTED NOT DETECTED Final   Staphylococcus lugdunensis NOT DETECTED NOT DETECTED Final   Streptococcus species NOT DETECTED NOT DETECTED Final   Streptococcus agalactiae NOT DETECTED NOT DETECTED  Final   Streptococcus pneumoniae NOT DETECTED NOT DETECTED Final   Streptococcus pyogenes NOT DETECTED NOT DETECTED Final   A.calcoaceticus-baumannii NOT DETECTED NOT DETECTED Final   Bacteroides fragilis NOT DETECTED NOT DETECTED Final   Enterobacterales NOT DETECTED NOT DETECTED Final   Enterobacter cloacae complex NOT DETECTED NOT DETECTED Final   Escherichia coli NOT DETECTED NOT DETECTED Final   Klebsiella aerogenes NOT DETECTED NOT DETECTED Final   Klebsiella oxytoca NOT DETECTED NOT DETECTED Final   Klebsiella pneumoniae NOT DETECTED NOT DETECTED Final   Proteus species NOT DETECTED NOT DETECTED Final   Salmonella species NOT DETECTED NOT DETECTED  Final   Serratia marcescens NOT DETECTED NOT DETECTED Final   Haemophilus influenzae NOT DETECTED NOT DETECTED Final   Neisseria meningitidis NOT DETECTED NOT DETECTED Final   Pseudomonas aeruginosa NOT DETECTED NOT DETECTED Final   Stenotrophomonas maltophilia NOT DETECTED NOT DETECTED Final   Candida albicans NOT DETECTED NOT DETECTED Final   Candida auris NOT DETECTED NOT DETECTED Final   Candida glabrata NOT DETECTED NOT DETECTED Final   Candida krusei NOT DETECTED NOT DETECTED Final   Candida parapsilosis NOT DETECTED NOT DETECTED Final   Candida tropicalis NOT DETECTED NOT DETECTED Final   Cryptococcus neoformans/gattii NOT DETECTED NOT DETECTED Final    Comment: Performed at [facility], [address], [place], [place] [address]  Urine Culture     Status: Abnormal   Collection Time: 02/04/21  6:30 AM   Specimen: In/Out Cath Urine  Result Value Ref Range Status   Specimen Description IN/OUT CATH URINE  Final   Special Requests   Final    NONE Performed at [facility], [address], [place], [place] [address]    Culture (A)  Final    40,000 COLONIES/mL METHICILLIN RESISTANT STAPHYLOCOCCUS AUREUS   Report Status 02/06/2021 FINAL  Final   Organism ID, Bacteria METHICILLIN RESISTANT STAPHYLOCOCCUS AUREUS (A)   Final      Susceptibility   Methicillin resistant staphylococcus aureus - MIC*    CIPROFLOXACIN >=8 RESISTANT Resistant     GENTAMICIN <=0.5 SENSITIVE Sensitive     NITROFURANTOIN <=16 SENSITIVE Sensitive     OXACILLIN >=4 RESISTANT Resistant     TETRACYCLINE <=1 SENSITIVE Sensitive     VANCOMYCIN 1 SENSITIVE Sensitive     TRIMETH/SULFA >=320 RESISTANT Resistant     CLINDAMYCIN <=0.25 SENSITIVE Sensitive     RIFAMPIN <=0.5 SENSITIVE Sensitive     Inducible Clindamycin NEGATIVE Sensitive     * 40,000 COLONIES/mL METHICILLIN RESISTANT STAPHYLOCOCCUS AUREUS    Zamir Staples S Mykah Shin   [facility] If 7PM-7AM, please contact night-coverage at [email or web address], Office  [phone]   02/10/2021, 1:41 PM  LOS: 7 days

## 2021-02-11 LAB — BASIC METABOLIC PANEL
Anion gap: 10 (ref 5–15)
BUN: 14 mg/dL (ref 8–23)
CO2: 32 mmol/L (ref 22–32)
Calcium: 9.4 mg/dL (ref 8.9–10.3)
Chloride: 97 mmol/L — ABNORMAL LOW (ref 98–111)
Creatinine, Ser: 0.75 mg/dL (ref 0.44–1.00)
GFR, Estimated: >60 mL/min (ref >60)
Glucose, Bld: 214 mg/dL — ABNORMAL HIGH (ref 70–99)
Potassium: 4.5 mmol/L (ref 3.5–5.1)
Sodium: 139 mmol/L (ref 135–145)

## 2021-02-11 LAB — RESP PANEL BY RT-PCR (FLU A&B, COVID) ARPGX2
Influenza A by PCR: NEGATIVE
Influenza B by PCR: NEGATIVE
SARS Coronavirus 2 by RT PCR: NEGATIVE

## 2021-02-11 LAB — GLUCOSE, CAPILLARY
Glucose-Capillary: 217 mg/dL — ABNORMAL HIGH (ref 70–99)
Glucose-Capillary: 428 mg/dL — ABNORMAL HIGH (ref 70–99)
Glucose-Capillary: 439 mg/dL — ABNORMAL HIGH (ref 70–99)
Glucose-Capillary: 442 mg/dL — ABNORMAL HIGH (ref 70–99)
Glucose-Capillary: 326 mg/dL — ABNORMAL HIGH (ref 70–99)
Glucose-Capillary: 203 mg/dL — ABNORMAL HIGH (ref 70–99)

## 2021-02-11 MED ORDER — FOOD THICKENER (SIMPLYTHICK)
1.0000 | ORAL | Status: DC | PRN
  Filled 2021-02-11: qty 10

## 2021-02-11 MED ORDER — INSULIN ASPART 100 UNIT/ML IJ SOLN
20.0000 [IU] | Freq: Once | INTRAMUSCULAR | Status: AC
  Administered 2021-02-11: 20 [IU] via SUBCUTANEOUS

## 2021-02-11 MED ORDER — DOXYCYCLINE HYCLATE 100 MG PO TABS
100.0000 mg | ORAL_TABLET | Freq: Two times a day (BID) | ORAL | Status: DC
  Administered 2021-02-11 – 2021-02-13 (×5): 100 mg via ORAL
  Filled 2021-02-11 (×5): qty 1

## 2021-02-11 MED ORDER — GUAIFENESIN ER 600 MG PO TB12
1200.0000 mg | ORAL_TABLET | Freq: Two times a day (BID) | ORAL | Status: DC
  Administered 2021-02-11 – 2021-02-13 (×5): 1200 mg via ORAL
  Filled 2021-02-11 (×5): qty 2

## 2021-02-11 NOTE — Progress Notes (Addendum)
[facility]  PROGRESS NOTE  Courtney [name] QVZ:[id number] DOB: 04-21-1946 DOA: 02/03/2021 PCP: Jordan Hawks, PA-C   Brief HPI:   75 year old female with medical history of COPD, diabetes mellitus type 2, hypertension, hyperlipidemia, chronic diastolic heart failure who was admitted to [facility] on 02/03/2021 with acute hypoxemic respiratory distress after presenting from skilled nursing facility to ED for evaluation of increased somnolence.  O2 sats were 88% on room air, chest x-ray showed vascular congestion, she had diffuse wheezing and diminished air entry.  She was treated with steroids with significant improvement of her COPD.  Work-up also showed community-acquired pneumonia.  She had dysphagia with work-up significant for esophageal stricture, EGD was planned per GI.    Subjective   Patient seen and examined, breathing has improved.   Assessment/Plan:    Acute hypoxemic respiratory failure -Secondary to COPD exacerbation/pneumonia -Treated with Rocephin and Zithromax for 5 days -Chest x-ray showed left lower opacity, pleural effusion and volume overload -Patient remains with significant upper airway congestion, she was encouraged with -DuoNeb nebulizers every 6 hours -We will add doxycycline 100 mg p.o. twice daily  Acute on chronic diastolic heart failure -In the context of documented history of chronic diastolic heart failure -Echocardiogram from June 2021 showed grade 2 diastolic dysfunction with EF of 60 to 65% -Recurrent evidence of pulmonary edema -Was not diuresed due to hypernatremia Started on Lasix 40 mg p.o. daily -Follow BMP in am  Dysphagia -Esophagram showed esophageal stricture -Underwent EGD which showed Barrett's esophagus -Biopsy was obtained, patient will follow up with GI as outpatient -May benefit for esophageal manometry, GI to discuss as outpatient -Started on twice daily PPI for 8 weeks   Hypernatremia -Resolved with  D5W -Started on Lasix as above, follow BMP in am  MRSA UTI -Urine culture grew MRSA -Started on Zyvox for 3 days  Acute metabolic encephalopathy -Likely from UTI as above -TSH is low at 2, likely sick euthyroid syndrome -CT head showed no acute findings  Diabetes mellitus type 2 -Hemoglobin A1c 7.8% -CBG well controlled -Continue Lantus, sliding scale insulin NovoLog  Hypertension -Blood pressure is stable -Continue Norvasc, Toprol-XL     Scheduled medications:    amLODipine  10 mg Oral Daily   aspirin EC  81 mg Oral Daily   atorvastatin  80 mg Oral QHS   doxycycline  100 mg Oral Q12H   enoxaparin (LOVENOX) injection  40 mg Subcutaneous Q24H   fluticasone  1 spray Each Nare Daily   furosemide  40 mg Oral Daily   guaiFENesin  1,200 mg Oral BID   insulin aspart  0-15 Units Subcutaneous TID WC   insulin aspart  0-5 Units Subcutaneous QHS   insulin aspart  4 Units Subcutaneous TID WC   insulin glargine-yfgn  25 Units Subcutaneous Daily   ipratropium-albuterol  3 mL Nebulization Q6H   metoprolol succinate  25 mg Oral Daily   pantoprazole  40 mg Oral Daily   saccharomyces boulardii  250 mg Oral BID   senna  2 tablet Oral Daily   sertraline  50 mg Oral Daily     Data Reviewed:   CBG:  Recent Labs  Lab 02/10/21 2103 02/11/21 0740 02/11/21 1152 02/11/21 1237 02/11/21 1454  GLUCAP 389* 217* 428* 439* 442*    SpO2: 100 % O2 Flow Rate (L/min): 2 L/min    Vitals:   02/11/21 1200 02/11/21 1300 02/11/21 1353 02/11/21 1402  BP: (!) 156/83     Pulse: 83  Resp: (!) 21 (!) 23 (!) 21   Temp:      TempSrc:      SpO2: 97%   100%  Weight:      Height:         Intake/Output Summary (Last 24 hours) at 02/11/2021 1512 Last data filed at 02/11/2021 1100 Gross per 24 hour  Intake 60 ml  Output 1050 ml  Net -990 ml    10/12 1901 - 10/14 0700 In: 540 [P.O.:540] Out: 1575 [Urine:700]  Filed Weights   02/09/21 1153 02/10/21 0500 02/11/21 0323  Weight: 102  kg 107 kg 106.9 kg    Data Reviewed: Basic Metabolic Panel: Recent Labs  Lab 02/07/21 0148 02/08/21 0151 02/09/21 0249 02/10/21 0433 02/11/21 0448  NA 152* 141 138 138 139  K 4.7 5.0 4.4 4.4 4.5  CL 112* 100 96* 95* 97*  CO2 31 33* 38* 33* 32  GLUCOSE 280* 198* 227* 206* 214*  BUN 35* 28* 29* 21 14  CREATININE 0.87 0.75 0.81 0.79 0.75  CALCIUM 9.8 9.8 9.7 9.6 9.4   Liver Function Tests: No results for input(s): AST, ALT, ALKPHOS, BILITOT, PROT, ALBUMIN in the last 168 hours.  No results for input(s): LIPASE, AMYLASE in the last 168 hours. No results for input(s): AMMONIA in the last 168 hours.  CBC: Recent Labs  Lab 02/06/21 0204 02/07/21 0548 02/08/21 0151 02/09/21 0249 02/10/21 0433  WBC 12.4* 17.4* 15.5* 16.8* 12.6*  HGB 14.2 14.4 14.4 14.1 14.0  HCT 49.9* 50.1* 50.6* 47.8* 48.1*  MCV 88.6 85.8 87.5 85.7 85.7  PLT 230 212 207 248 131*   Cardiac Enzymes: No results for input(s): CKTOTAL, CKMB, CKMBINDEX, TROPONINI in the last 168 hours. BNP (last 3 results) Recent Labs    02/03/21 2122 02/05/21 0207  BNP 725.1* 417.3*    ProBNP (last 3 results) No results for input(s): PROBNP in the last 8760 hours.  CBG: Recent Labs  Lab 02/10/21 2103 02/11/21 0740 02/11/21 1152 02/11/21 1237 02/11/21 1454  GLUCAP 389* 217* 428* 439* 442*       Radiology Reports  No results found.     Antibiotics: Anti-infectives (From admission, onward)    Start     Dose/Rate Route Frequency Ordered Stop   02/11/21 1000  doxycycline (VIBRA-TABS) tablet 100 mg        100 mg Oral Every 12 hours 02/11/21 0856     02/08/21 2200  cefTRIAXone (ROCEPHIN) 2 g in sodium chloride 0.9 % 100 mL IVPB        2 g 200 mL/hr over 30 Minutes Intravenous Every 24 hours 02/08/21 1420 02/08/21 2311   02/07/21 1445  linezolid (ZYVOX) tablet 600 mg        600 mg Oral Every 12 hours 02/07/21 1355 02/10/21 0959   02/04/21 2200  azithromycin (ZITHROMAX) 500 mg in sodium chloride 0.9 %  250 mL IVPB        500 mg 250 mL/hr over 60 Minutes Intravenous Every 24 hours 02/04/21 0029 02/08/21 2359   02/04/21 2200  cefTRIAXone (ROCEPHIN) 1 g in sodium chloride 0.9 % 100 mL IVPB  Status:  Discontinued        1 g 200 mL/hr over 30 Minutes Intravenous Every 24 hours 02/04/21 0029 02/08/21 1420   02/03/21 2330  cefTRIAXone (ROCEPHIN) 2 g in sodium chloride 0.9 % 100 mL IVPB        2 g 200 mL/hr over 30 Minutes Intravenous  Once 02/03/21 2315 02/04/21 0107  02/03/21 2330  azithromycin (ZITHROMAX) 500 mg in sodium chloride 0.9 % 250 mL IVPB        500 mg 250 mL/hr over 60 Minutes Intravenous  Once 02/03/21 2315 02/04/21 0225         DVT prophylaxis: Lovenox  Code Status: Full code  Family Communication: No family at bedside   Consultants: Gastroenterology  Procedures:     Objective    Physical Examination:  General-appears in no acute distress Heart-S1-S2, regular, no murmur auscultated Lungs-bilateral rhonchi auscultated Abdomen-soft, nontender, no organomegaly Extremities-no edema in the lower extremities Neuro-alert, oriented x3, no focal deficit noted  Status is: Inpatient  Dispo: The patient is from: Home              Anticipated d/c is to: Home              Anticipated d/c date is: 02/11/2021              Patient currently not stable for discharge  Barrier to discharge-ongoing treatment for acute hypoxemic respiratory failure  COVID-19 Labs  No results for input(s): DDIMER, FERRITIN, LDH, CRP in the last 72 hours.  Lab Results  Component Value Date   SARSCOV2NAA NEGATIVE 02/03/2021   SARSCOV2NAA NEGATIVE 10/21/2019   SARSCOV2NAA NEGATIVE 10/18/2019     Pressure Injury 06/25/18 Stage II -  Partial thickness loss of dermis presenting as a shallow open ulcer with a red, pink wound bed without slough. (Active)  06/25/18 2200  Location: Sacrum  Location Orientation: Right;Medial  Staging: Stage II -  Partial thickness loss of dermis  presenting as a shallow open ulcer with a red, pink wound bed without slough.  Wound Description (Comments):   Present on Admission: Yes     Pressure Injury 06/25/18 Stage II -  Partial thickness loss of dermis presenting as a shallow open ulcer with a red, pink wound bed without slough. (Active)  06/25/18 2200  Location: Sacrum  Location Orientation: Left;Medial  Staging: Stage II -  Partial thickness loss of dermis presenting as a shallow open ulcer with a red, pink wound bed without slough.  Wound Description (Comments):   Present on Admission: Yes     Pressure Injury 06/25/18 Stage II -  Partial thickness loss of dermis presenting as a shallow open ulcer with a red, pink wound bed without slough. (Active)  06/25/18 2200  Location: Groin  Location Orientation: Right;Medial  Staging: Stage II -  Partial thickness loss of dermis presenting as a shallow open ulcer with a red, pink wound bed without slough.  Wound Description (Comments):   Present on Admission: Yes        Recent Results (from the past 240 hour(s))  Blood Culture (routine x 2)     Status: None   Collection Time: 02/03/21 10:12 PM   Specimen: BLOOD RIGHT HAND  Result Value Ref Range Status   Specimen Description BLOOD RIGHT HAND  Final   Special Requests   Final    BOTTLES DRAWN AEROBIC AND ANAEROBIC Blood Culture adequate volume   Culture   Final    NO GROWTH 5 DAYS Performed at [facility], [address] [place], [place] [address]    Report Status 02/08/2021 FINAL  Final  Blood Culture (routine x 2)     Status: Abnormal   Collection Time: 02/03/21 10:24 PM   Specimen: BLOOD  Result Value Ref Range Status   Specimen Description BLOOD BLOOD RIGHT FOREARM  Final   Special  Requests   Final    BOTTLES DRAWN AEROBIC AND ANAEROBIC Blood Culture results may not be optimal due to an inadequate volume of blood received in culture bottles   Culture  Setup Time   Final    GRAM POSITIVE COCCI IN  CLUSTERS ANAEROBIC BOTTLE ONLY CRITICAL RESULT CALLED TO, READ BACK BY AND VERIFIED WITH: PHARMD JAMES LEDFORD 02/04/21@21 :46 BY TW    Culture (A)  Final    STAPHYLOCOCCUS HOMINIS THE SIGNIFICANCE OF ISOLATING THIS ORGANISM FROM A SINGLE SET OF BLOOD CULTURES WHEN MULTIPLE SETS ARE DRAWN IS UNCERTAIN. PLEASE NOTIFY THE MICROBIOLOGY DEPARTMENT WITHIN ONE WEEK IF SPECIATION AND SENSITIVITIES ARE REQUIRED. Performed at [facility], [address], [place], [place] [address]    Report Status 02/06/2021 FINAL  Final  Resp Panel by RT-PCR (Flu A&B, Covid) Nasopharyngeal Swab     Status: None   Collection Time: 02/03/21 10:24 PM   Specimen: Nasopharyngeal Swab; Nasopharyngeal(NP) swabs in vial transport medium  Result Value Ref Range Status   SARS Coronavirus 2 by RT PCR NEGATIVE NEGATIVE Final    Comment: (NOTE) SARS-CoV-2 target nucleic acids are NOT DETECTED.  The SARS-CoV-2 RNA is generally detectable in upper respiratory specimens during the acute phase of infection. The lowest concentration of SARS-CoV-2 viral copies this assay can detect is 138 copies/mL. A negative result does not preclude SARS-Cov-2 infection and should not be used as the sole basis for treatment or other patient management decisions. A negative result may occur with  improper specimen collection/handling, submission of specimen other than nasopharyngeal swab, presence of viral mutation(s) within the areas targeted by this assay, and inadequate number of viral copies(<138 copies/mL). A negative result must be combined with clinical observations, patient history, and epidemiological information. The expected result is Negative.  Fact Sheet for Patients:  BloggerCourse.com  Fact Sheet for Healthcare Providers:  SeriousBroker.it  This test is no t yet approved or cleared by the [place] FDA and  has been authorized for detection and/or diagnosis of  SARS-CoV-2 by FDA under an Emergency Use Authorization (EUA). This EUA will remain  in effect (meaning this test can be used) for the duration of the COVID-19 declaration under Section 564(b)(1) of the Act, 21 U.S.C.section 360bbb-3(b)(1), unless the authorization is terminated  or revoked sooner.       Influenza A by PCR NEGATIVE NEGATIVE Final   Influenza B by PCR NEGATIVE NEGATIVE Final    Comment: (NOTE) The Xpert Xpress SARS-CoV-2/FLU/RSV plus assay is intended as an aid in the diagnosis of influenza from Nasopharyngeal swab specimens and should not be used as a sole basis for treatment. Nasal washings and aspirates are unacceptable for Xpert Xpress SARS-CoV-2/FLU/RSV testing.  Fact Sheet for Patients: BloggerCourse.com  Fact Sheet for Healthcare Providers: SeriousBroker.it  This test is not yet approved or cleared by the [place] FDA and has been authorized for detection and/or diagnosis of SARS-CoV-2 by FDA under an Emergency Use Authorization (EUA). This EUA will remain in effect (meaning this test can be used) for the duration of the COVID-19 declaration under Section 564(b)(1) of the Act, 21 U.S.C. section 360bbb-3(b)(1), unless the authorization is terminated or revoked.  Performed at [facility], [address] [place], [place] [address]   Blood Culture ID Panel (Reflexed)     Status: Abnormal   Collection Time: 02/03/21 10:24 PM  Result Value Ref Range Status   Enterococcus faecalis NOT DETECTED NOT DETECTED Final   Enterococcus Faecium NOT DETECTED  NOT DETECTED Final   Listeria monocytogenes NOT DETECTED NOT DETECTED Final   Staphylococcus species DETECTED (A) NOT DETECTED Final    Comment: CRITICAL RESULT CALLED TO, READ BACK BY AND VERIFIED WITH: PHARMD JAMES LEDFORD 02/04/21@21 :46 BY TW    Staphylococcus aureus (BCID) NOT DETECTED NOT DETECTED Final   Staphylococcus epidermidis NOT DETECTED NOT  DETECTED Final   Staphylococcus lugdunensis NOT DETECTED NOT DETECTED Final   Streptococcus species NOT DETECTED NOT DETECTED Final   Streptococcus agalactiae NOT DETECTED NOT DETECTED Final   Streptococcus pneumoniae NOT DETECTED NOT DETECTED Final   Streptococcus pyogenes NOT DETECTED NOT DETECTED Final   A.calcoaceticus-baumannii NOT DETECTED NOT DETECTED Final   Bacteroides fragilis NOT DETECTED NOT DETECTED Final   Enterobacterales NOT DETECTED NOT DETECTED Final   Enterobacter cloacae complex NOT DETECTED NOT DETECTED Final   Escherichia coli NOT DETECTED NOT DETECTED Final   Klebsiella aerogenes NOT DETECTED NOT DETECTED Final   Klebsiella oxytoca NOT DETECTED NOT DETECTED Final   Klebsiella pneumoniae NOT DETECTED NOT DETECTED Final   Proteus species NOT DETECTED NOT DETECTED Final   Salmonella species NOT DETECTED NOT DETECTED Final   Serratia marcescens NOT DETECTED NOT DETECTED Final   Haemophilus influenzae NOT DETECTED NOT DETECTED Final   Neisseria meningitidis NOT DETECTED NOT DETECTED Final   Pseudomonas aeruginosa NOT DETECTED NOT DETECTED Final   Stenotrophomonas maltophilia NOT DETECTED NOT DETECTED Final   Candida albicans NOT DETECTED NOT DETECTED Final   Candida auris NOT DETECTED NOT DETECTED Final   Candida glabrata NOT DETECTED NOT DETECTED Final   Candida krusei NOT DETECTED NOT DETECTED Final   Candida parapsilosis NOT DETECTED NOT DETECTED Final   Candida tropicalis NOT DETECTED NOT DETECTED Final   Cryptococcus neoformans/gattii NOT DETECTED NOT DETECTED Final    Comment: Performed at [facility], [address], [place], [place] [address]  Urine Culture     Status: Abnormal   Collection Time: 02/04/21  6:30 AM   Specimen: In/Out Cath Urine  Result Value Ref Range Status   Specimen Description IN/OUT CATH URINE  Final   Special Requests   Final    NONE Performed at [facility], [address] [place], [place] [address]    Culture (A)   Final    40,000 COLONIES/mL METHICILLIN RESISTANT STAPHYLOCOCCUS AUREUS   Report Status 02/06/2021 FINAL  Final   Organism ID, Bacteria METHICILLIN RESISTANT STAPHYLOCOCCUS AUREUS (A)  Final      Susceptibility   Methicillin resistant staphylococcus aureus - MIC*    CIPROFLOXACIN >=8 RESISTANT Resistant     GENTAMICIN <=0.5 SENSITIVE Sensitive     NITROFURANTOIN <=16 SENSITIVE Sensitive     OXACILLIN >=4 RESISTANT Resistant     TETRACYCLINE <=1 SENSITIVE Sensitive     VANCOMYCIN 1 SENSITIVE Sensitive     TRIMETH/SULFA >=320 RESISTANT Resistant     CLINDAMYCIN <=0.25 SENSITIVE Sensitive     RIFAMPIN <=0.5 SENSITIVE Sensitive     Inducible Clindamycin NEGATIVE Sensitive     * 40,000 COLONIES/mL METHICILLIN RESISTANT STAPHYLOCOCCUS AUREUS    Shrinika Blatz S Onesty Clair   [facility] If 7PM-7AM, please contact night-coverage at [email or web address], Office  [phone]   02/11/2021, 3:12 PM  LOS: 8 days

## 2021-02-11 NOTE — Progress Notes (Signed)
Inpatient Diabetes Program Recommendations  AACE/ADA: New Consensus Statement on Inpatient Glycemic Control (2015)  Target Ranges:  Prepandial:   less than 140 mg/dL      Peak postprandial:   less than 180 mg/dL (1-2 hours)      Critically ill patients:  140 - 180 mg/dL   Lab Results  Component Value Date   GLUCAP 217 (H) 02/11/2021   HGBA1C 9.1 (H) 02/04/2021    Review of Glycemic Control Results for Courtney [name], Courtney [name] (MRN [id number]) as of 02/11/2021 09:26  Ref. Range 02/10/2021 07:27 02/10/2021 11:35 02/10/2021 16:35 02/10/2021 21:03 02/11/2021 07:40  Glucose-Capillary Latest Ref Range: 70 - 99 mg/dL 409 (H) 811 (H) 914 (H) 389 (H) 217 (H)   Diabetes history: DM 2 Outpatient Diabetes medications: Admelog 5 units 4x/day, Lantus 21 units bid, Metformin 1000 mg bid Current orders for Inpatient glycemic control:  Semglee 25 units Daily Novolog 0-15 units tid + hs Novolog 4 units tid meal coverage  Inpatient Diabetes Program Recommendations:    - Increase Semglee to 30 units  Thanks,  Christena Deem RN, MSN, BC-ADM Inpatient Diabetes Coordinator Team Pager [phone] (8a-5p)

## 2021-02-11 NOTE — Progress Notes (Signed)
Speech Language Pathology Treatment: Dysphagia  Patient Details Name: Courtney [name] MRN: [id number] DOB: [birth year] Today's Date: 02/11/2021 Time: 6644-0347 SLP Time Calculation (min) (ACUTE ONLY): 13 min  Assessment / Plan / Recommendation Clinical Impression  Pt alert, communicative and interactive.  She has been enjoying dysphagia three solids. Today she demonstrated active mastication of solids, no overt s/s of aspiration with thins during the swallow.  After eating, she belched and began coughing, describing "that water is coming back up," which is c/w her complaints prior to EGD. Pt remains at risk for occasional aspiration both during the swallow and post-prandially; however, her overall awareness and attention has improved since her MBS on 10/10.  She is better able to follow precautions.  Recommend dysphagia 3 diet; advance liquids to thin.  SLP will follow for safety/education.  D/W RN.   HPI HPI: Courtney [name] is a 75 y.o. female with medical history significant for COPD, type 2 diabetes mellitus, hypertension, hyperlipidemia, chronic diastolic heart failure, who is admitted to [facility] on 02/03/2021 with acute hypoxic respiratory distress after presenting from snf to [facility] ED for evaluation of increased somnolence.  ED work-up significant for temperature of 99.3, oxygen saturation 88% on room air, chest x-ray significant for vascular congestion, she is with diffuse wheezing and diminished air entry, admitted for further management.Pt coughing after [facility].      SLP Plan  Continue with current plan of care      Recommendations for follow up therapy are one component of a multi-disciplinary discharge planning process, led by the attending physician.  Recommendations may be updated based on patient status, additional functional criteria and insurance authorization.    Recommendations  Diet recommendations: Dysphagia 3 (mechanical soft);Thin liquid Liquids provided via:  Cup;Straw Medication Administration: Crushed with puree Supervision: Intermittent supervision to cue for compensatory strategies;Staff to assist with self feeding Compensations: Small sips/bites;Follow solids with liquid Postural Changes and/or Swallow Maneuvers: Upright 30-60 min after meal                Oral Care Recommendations: Oral care BID Follow up Recommendations: Skilled Nursing facility Plan: Continue with current plan of care                     Curtiss Mahmood L. Samson Frederic, MA CCC/SLP Acute Rehabilitation Services Office number [phone] Pager [phone]   Courtney [name] 02/11/2021, 12:10 PM

## 2021-02-11 NOTE — Progress Notes (Signed)
Sharl Ma, MD text paged about increasing blood sugar. Most recent sugar 428. Patient has been increasing her requests for applejuice, orange juices, and applesauce.   Patient education given at this time.

## 2021-02-11 NOTE — TOC Progression Note (Signed)
Transition of Care [facility]) - Progression Note    Patient Details  Name: Courtney [name] MRN: [id number] Date of Birth: 11/10/1945  Transition of Care [facility] [address] CM/SW Contact  Carley Hammed, [place] Phone Number: 02/11/2021, 12:12 PM  Clinical Narrative:    CSW was notified by MD that pt can DC tomorrow to [facility]. [facility] confirmed they can accept pt tomorrow. DNR on the chart and covid requested. TOC will continue to follow.   Expected Discharge Plan: Long Term Nursing Home Barriers to Discharge: Continued Medical Work up  Expected Discharge Plan and Services Expected Discharge Plan: Long Term Nursing Home     Post Acute Care Choice: Skilled Nursing Facility Living arrangements for the past 2 months: Skilled Nursing Facility                                       Social Determinants of Health (SDOH) Interventions    Readmission Risk Interventions No flowsheet data found.

## 2021-02-12 LAB — GLUCOSE, CAPILLARY
Glucose-Capillary: 169 mg/dL — ABNORMAL HIGH (ref 70–99)
Glucose-Capillary: 211 mg/dL — ABNORMAL HIGH (ref 70–99)
Glucose-Capillary: 160 mg/dL — ABNORMAL HIGH (ref 70–99)
Glucose-Capillary: 163 mg/dL — ABNORMAL HIGH (ref 70–99)

## 2021-02-12 MED ORDER — FUROSEMIDE 40 MG PO TABS: 40.0000 mg | ORAL_TABLET | Freq: Every day | ORAL | Status: DC

## 2021-02-12 MED ORDER — GUAIFENESIN ER 600 MG PO TB12: 1200.0000 mg | ORAL_TABLET | Freq: Two times a day (BID) | ORAL | 0 refills | Status: AC

## 2021-02-12 MED ORDER — DOXYCYCLINE HYCLATE 100 MG PO TABS: 100.0000 mg | ORAL_TABLET | Freq: Two times a day (BID) | ORAL | 0 refills | Status: AC

## 2021-02-12 MED ORDER — PANTOPRAZOLE SODIUM 40 MG PO TBEC
40.0000 mg | DELAYED_RELEASE_TABLET | Freq: Two times a day (BID) | ORAL | 0 refills | Status: DC
Start: 2021-02-12 — End: 2021-07-04

## 2021-02-12 NOTE — H&P (Signed)
Physician Discharge Summary  ALVERNA FAWLEY ZOX:[id number] DOB: Oct 17, 1945 DOA: 02/03/2021  PCP: Jordan Hawks, PA-C  Admit date: 02/03/2021 Discharge date: 02/12/2021  Time spent: 60 minutes  Recommendations for Outpatient Follow-up:  Follow-up gastroenterology in 2 weeks Continue Protonix 40 mg p.o. twice daily for 8 weeks Continue doxycycline 100 mg p.o. twice daily for 5 days   Discharge Diagnoses:  Principal Problem:   Acute respiratory failure with hypoxia (HCC) Active Problems:   Hypertension   Acute exacerbation of chronic obstructive pulmonary disease (COPD) (HCC)   Acute on chronic diastolic CHF (congestive heart failure) (HCC)   Severe sepsis (HCC)   CAP (community acquired pneumonia)   Acute metabolic encephalopathy   DM2 (diabetes mellitus, type 2) (HCC)   Dysphagia   Discharge Condition: Stable  Diet recommendation: Carb modified diet  Filed Weights   02/09/21 1153 02/10/21 0500 02/11/21 0323  Weight: 102 kg 107 kg 106.9 kg    History of present illness:  75 year old female with medical history of COPD, diabetes mellitus type 2, hypertension, hyperlipidemia, chronic diastolic heart failure who was admitted to [facility] on 02/03/2021 with acute hypoxemic respiratory distress after presenting from skilled nursing facility to ED for evaluation of increased somnolence.  O2 sats were 88% on room air, chest x-ray showed vascular congestion, she had diffuse wheezing and diminished air entry.  She was treated with steroids with significant improvement of her COPD.  Work-up also showed community-acquired pneumonia.  She had dysphagia with work-up significant for esophageal stricture, EGD was planned per GI.  Hospital Course:   Acute hypoxemic respiratory failure -Secondary to COPD exacerbation/pneumonia -Significantly improved, patient not requiring oxygen anymore -Treated with Rocephin and Zithromax for 5 days -Chest x-ray showed left lower opacity,  pleural effusion and volume overload -Patient remains with significant upper airway congestion, she was encouraged with -DuoNeb nebulizers every 6 hours -Started on doxycycline 100 mg p.o. twice daily for 5 days   Acute on chronic diastolic heart failure -In the context of documented history of chronic diastolic heart failure -Echocardiogram from June 2021 showed grade 2 diastolic dysfunction with EF of 60 to 65% -Recurrent evidence of pulmonary edema Started on Lasix 40 mg p.o. daily    Dysphagia -Esophagram showed esophageal stricture -Underwent EGD which showed Barrett's esophagus -Biopsy was obtained, patient will follow up with GI as outpatient -May benefit for esophageal manometry, GI to discuss as outpatient -Started on twice daily PPI for 8 weeks -As per GI   Brief GI Path Note: Esophageal biopsies showed squamocolumnar mucosa without signs of metaplasia/dysplasia/malignancy, and stomach biopsies were negative for H pylori. I do not feel that these findings warrant further endoscopic surveillance. GI follow up has already been scheduled.     Hypernatremia -Resolved with D5W    MRSA UTI -Urine culture grew MRSA Treated with Zyvox for 3 days   Acute metabolic encephalopathy -Likely from UTI as above -TSH is low at 0.2, likely sick euthyroid syndrome -Check TSH in 6 to 8 weeks -CT head showed no acute findings   Diabetes mellitus type 2 -Hemoglobin A1c 7.8% -CBG well controlled -Continue Lantus, sliding scale insulin NovoLog -Continue metformin   Hypertension -Blood pressure is stable -Continue Norvasc, Toprol-XL      Procedures: EGD  Consultations: Gastroenterology  Discharge Exam: Vitals:   02/12/21 1116 02/12/21 1324  BP: (!) 132/59   Pulse: 83   Resp: 20   Temp: 98.1 F (36.7 C)   SpO2: 100% 99%    General: Appears  in no acute distress Cardiovascular: S1-S2, regular, no murmur auscultated Respiratory: Clear to auscultation  bilaterally  Discharge Instructions   Discharge Instructions     Diet - low sodium heart healthy   Complete by: As directed    Increase activity slowly   Complete by: As directed       Allergies as of 02/12/2021       Reactions   Penicillins Other (See Comments)   Did it involve swelling of the face/tongue/throat, SOB, or low BP? No Did it involve sudden or severe rash/hives, skin peeling, or any reaction on the inside of your mouth or nose? Yes Did you need to seek medical attention at a hospital or doctor's office? yes When did it last happen?      more than 10 years If all above answers are "NO", may proceed with cephalosporin use.        Medication List     STOP taking these medications    Admelog SoloStar 100 UNIT/ML KwikPen Generic drug: insulin lispro   Ensure Max Protein Liqd   famotidine 20 MG tablet Commonly known as: PEPCID   hydrOXYzine 25 MG tablet Commonly known as: ATARAX/VISTARIL   nutrition supplement (JUVEN) Pack   predniSONE 20 MG tablet Commonly known as: DELTASONE       TAKE these medications    acetaminophen 650 MG CR tablet Commonly known as: TYLENOL Take 650 mg by mouth every 6 (six) hours as needed for pain.   amLODipine 10 MG tablet Commonly known as: NORVASC Take 1 tablet (10 mg total) by mouth daily.   arformoterol 15 MCG/2ML Nebu Commonly known as: BROVANA Take 2 mLs (15 mcg total) by nebulization 2 (two) times daily.   aspirin 81 MG EC tablet Take 1 tablet (81 mg total) by mouth daily.   atorvastatin 80 MG tablet Commonly known as: LIPITOR Take 80 mg by mouth at bedtime.   budesonide 0.25 MG/2ML nebulizer solution Commonly known as: PULMICORT Take 2 mLs (0.25 mg total) by nebulization 2 (two) times daily.   D-5000 125 MCG (5000 UT) Tabs Generic drug: Cholecalciferol Take 1 tablet by mouth daily.   doxycycline 100 MG tablet Commonly known as: VIBRA-TABS Take 1 tablet (100 mg total) by mouth every 12  (twelve) hours for 5 days.   ferrous sulfate 325 (65 FE) MG tablet Take 1 tablet (325 mg total) by mouth 2 (two) times daily with a meal. What changed: when to take this   fluticasone 50 MCG/ACT nasal spray Commonly known as: FLONASE Place 1 spray into both nostrils daily.   furosemide 40 MG tablet Commonly known as: LASIX Take 1 tablet (40 mg total) by mouth daily. Start taking on: February 13, 2021   guaiFENesin 600 MG 12 hr tablet Commonly known as: MUCINEX Take 2 tablets (1,200 mg total) by mouth 2 (two) times daily for 5 days.   insulin aspart 100 UNIT/ML injection Commonly known as: novoLOG Inject 0-16 Units into the skin See admin instructions. Inject before meals and at bedtime, per sliding scale: 0-150 = 0U 151-200 = 4U 201-250 = 6U 251-300 = 8U 301-350 = 10U 351-400 = 14U 401-450U = 16U   ipratropium-albuterol 0.5-2.5 (3) MG/3ML Soln Commonly known as: DUONEB Take 3 mLs by nebulization every 6 (six) hours as needed.   Lantus SoloStar 100 UNIT/ML Solostar Pen Generic drug: insulin glargine Inject 21 Units into the skin 2 (two) times daily.   melatonin 3 MG Tabs tablet Take 1 tablet (3 mg total)  by mouth at bedtime.   memantine 5 MG tablet Commonly known as: NAMENDA Take 5 mg by mouth at bedtime.   metFORMIN 1000 MG tablet Commonly known as: GLUCOPHAGE Take 1,000 mg by mouth 2 (two) times daily with a meal.   metoprolol succinate 25 MG 24 hr tablet Commonly known as: TOPROL-XL Take 25 mg by mouth daily.   pantoprazole 40 MG tablet Commonly known as: PROTONIX Take 1 tablet (40 mg total) by mouth 2 (two) times daily.   polyethylene glycol 17 g packet Commonly known as: MIRALAX / GLYCOLAX Take 17 g by mouth daily.   saccharomyces boulardii 250 MG capsule Commonly known as: FLORASTOR Take 250 mg by mouth 2 (two) times daily.   senna 8.6 MG Tabs tablet Commonly known as: SENOKOT Take 2 tablets by mouth daily.   sertraline 50 MG tablet Commonly  known as: ZOLOFT Take 50 mg by mouth daily.   tiotropium 18 MCG inhalation capsule Commonly known as: SPIRIVA Place 18 mcg into inhaler and inhale daily.       Allergies  Allergen Reactions   Penicillins Other (See Comments)    Did it involve swelling of the face/tongue/throat, SOB, or low BP? No Did it involve sudden or severe rash/hives, skin peeling, or any reaction on the inside of your mouth or nose? Yes Did you need to seek medical attention at a hospital or doctor's office? yes When did it last happen?      more than 10 years If all above answers are "NO", may proceed with cephalosporin use.       The results of significant diagnostics from this hospitalization (including imaging, microbiology, ancillary and laboratory) are listed below for reference.    Significant Diagnostic Studies: CT HEAD WO CONTRAST ( )  Result Date: 02/07/2021 CLINICAL DATA:  Mental status change EXAM: CT HEAD WITHOUT CONTRAST TECHNIQUE: Contiguous axial images were obtained from the base of the skull through the vertex without intravenous contrast. COMPARISON:  11/01/2018 FINDINGS: Brain: No evidence of acute infarction, hemorrhage, cerebral edema, mass, mass effect, or midline shift. Periventricular white matter changes, likely the sequela of chronic small vessel ischemic disease. No extra-axial fluid collection. Vascular: No hyperdense vessel or unexpected calcification. Skull: Normal. Negative for fracture or focal lesion. Sinuses/Orbits: No acute finding. Other: The mastoid air cells are well aerated. IMPRESSION: No acute intracranial process. Electronically Signed   By: Wiliam Ke M.D.   On: 02/07/2021 11:37   DG Chest Port 1 View  Result Date: 02/05/2021 CLINICAL DATA:  Altered mental status, shortness of breath. EXAM: PORTABLE CHEST 1 VIEW COMPARISON:  February 03, 2021 FINDINGS: The cardiomediastinal silhouette is enlarged. Calcific atherosclerotic disease and tortuosity of the aorta.  Streaky peribronchial opacities. Left lower lobe atelectasis versus airspace consolidation. Probable right pleural effusion. Osseous structures are without acute abnormality. Soft tissues are grossly normal. IMPRESSION: 1. Left lower lobe atelectasis versus airspace consolidation. 2. Probable right pleural effusion. 3. Interstitial pulmonary edema. Electronically Signed   By: Ted Mcalpine M.D.   On: 02/05/2021 14:50   DG Chest Port 1 View  Result Date: 02/03/2021 CLINICAL DATA:  Questionable sepsis. EXAM: PORTABLE CHEST 1 VIEW COMPARISON:  Chest x-ray 10/19/2019. FINDINGS: Heart is enlarged, unchanged. There is central pulmonary vascular congestion which has increased from prior. Interstitial opacity are present in the lung bases. There is a band of atelectasis in the left mid lung. There is no pleural effusion or pneumothorax identified. No acute fractures. IMPRESSION: 1. Cardiomegaly with central pulmonary vascular congestion/mild  interstitial edema. Electronically Signed   By: Darliss Cheney M.D.   On: 02/03/2021 22:27   DG Swallowing Func-Speech Pathology  Result Date: 02/07/2021 Table formatting from the original result was not included. Objective Swallowing Evaluation: Type of Study: MBS-Modified Barium Swallow Study  Patient Details Name: Courtney [name] MRN: [id number] Date of Birth: [birth year] Today's Date: 02/07/2021 Time: SLP Start Time (ACUTE ONLY): 1230 -SLP Stop Time (ACUTE ONLY): 1250 SLP Time Calculation (min) (ACUTE ONLY): 20 min Past Medical History: Past Medical History: Diagnosis Date  Anemia   Arthritis   Asthma   CAD (coronary artery disease)   COPD (chronic obstructive pulmonary disease) (HCC)   Diabetes mellitus   Hyperlipidemia   Hypertension   Myocardial infarct (HCC)   Postmenopausal atrophic vaginitis   Restless leg syndrome  Past Surgical History: Past Surgical History: Procedure Laterality Date  CHOLECYSTECTOMY  1970's  INCISION AND DRAINAGE ABSCESS Right 06/27/2018   Procedure: 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.;  Surgeon: Romie Levee, MD;  Location: [facility];  Service: General;  Laterality: Right;  INCISION AND DRAINAGE PERIRECTAL ABSCESS N/A 06/25/2018  Procedure: IRRIGATION AND DEBRIDEMENT PERIRECTAL ABCESS;  Surgeon: Romie Levee, MD;  Location: [facility];  Service: General;  Laterality: N/A;  IRRIGATION AND DEBRIDEMENT ABSCESS N/A 07/01/2018  Procedure: IRRIGATION AND DEBRIDEMENT ABSCESS;  Surgeon: Emelia Loron, MD;  Location: [facility];  Service: General;  Laterality: N/A;  OTHER SURGICAL HISTORY  1970's  hysterectomy  WOUND DEBRIDEMENT N/A 07/03/2018  Procedure: DEBRIDEMENT PELVIS WITH DRESSING CHANGE;  Surgeon: Emelia Loron, MD;  Location: [facility];  Service: General;  Laterality: N/A; HPI: GIANNY KILLMAN is a 75 y.o. female with medical history significant for COPD, type 2 diabetes mellitus, hypertension, hyperlipidemia, chronic diastolic heart failure, who is admitted to [facility] on 02/03/2021 with acute hypoxic respiratory distress after presenting from snf to [facility] ED for evaluation of increased somnolence.  ED work-up significant for temperature of 99.3, oxygen saturation 88% on room air, chest x-ray significant for vascular congestion, she is with diffuse wheezing and diminished air entry, admitted for further management.Pt coughing after [place].  No data recorded Assessment / Plan / Recommendation CHL IP CLINICAL IMPRESSIONS 02/07/2021 Clinical Impression Pt demonstrates a primairly oral dysphagia with lingual rocking of bolus and decreased bolus cohesion with spillage of the head of bolus with tail remaining in oral cavity. There are instances of aspiration before the swallow with thin and also after as pt transits the remaining bolus to an open airway. Sensation is inconsistent and often delayed. Pt also observed to have backflow of liquids from esophagus to pharynx with belching and esophageal sweep shows moderate stasis in  cervical and thoracic esophageal column. Pt has a moderate risk of postprandial aspiration. Recommend ongoing use of nectar thick liquids and pureed solids with esophageal precautions, primairly slow rate and upright positioning and sustaining upright position after meals. Will follow for potential for uprgrade which may be possible if mentation improves as pt was still weak and inattentive today. SLP Visit Diagnosis Dysphagia, oral phase (R13.11) Attention and concentration deficit following -- Frontal lobe and executive function deficit following -- Impact on safety and function Mild aspiration risk;Risk for inadequate nutrition/hydration   CHL IP TREATMENT RECOMMENDATION 02/07/2021 Treatment Recommendations Therapy as outlined in treatment plan below   Prognosis 02/07/2021 Prognosis for Safe Diet Advancement Fair Barriers to Reach Goals Cognitive deficits Barriers/Prognosis Comment -- CHL IP DIET RECOMMENDATION 02/07/2021 SLP Diet Recommendations Dysphagia 1 (Puree) solids;Nectar thick liquid Liquid Administration  via Cup;Straw Medication Administration Crushed with puree Compensations Slow rate;Small sips/bites Postural Changes Seated upright at 90 degrees;Remain semi-upright after after feeds/meals (Comment)   No flowsheet data found.  CHL IP FOLLOW UP RECOMMENDATIONS 02/07/2021 Follow up Recommendations Skilled Nursing facility   [facility] IP FREQUENCY AND DURATION 02/07/2021 Speech Therapy Frequency (ACUTE ONLY) min 2x/week Treatment Duration 2 weeks      CHL IP ORAL PHASE 02/07/2021 Oral Phase Impaired Oral - Pudding Teaspoon -- Oral - Pudding Cup -- Oral - Honey Teaspoon -- Oral - Honey Cup -- Oral - Nectar Teaspoon -- Oral - Nectar Cup -- Oral - Nectar Straw Decreased bolus cohesion;Delayed oral transit;Premature spillage Oral - Thin Teaspoon -- Oral - Thin Cup -- Oral - Thin Straw Premature spillage;Decreased bolus cohesion;Delayed oral transit;Lingual pumping Oral - Puree Lingual pumping;Delayed oral  transit;Decreased bolus cohesion;Premature spillage Oral - Mech Soft Premature spillage;Decreased bolus cohesion;Delayed oral transit;Lingual pumping Oral - Regular -- Oral - Multi-Consistency -- Oral - Pill -- Oral Phase - Comment --  CHL IP PHARYNGEAL PHASE 02/07/2021 Pharyngeal Phase Impaired Pharyngeal- Pudding Teaspoon -- Pharyngeal -- Pharyngeal- Pudding Cup -- Pharyngeal -- Pharyngeal- Honey Teaspoon -- Pharyngeal -- Pharyngeal- Honey Cup -- Pharyngeal -- Pharyngeal- Nectar Teaspoon -- Pharyngeal -- Pharyngeal- Nectar Cup -- Pharyngeal -- Pharyngeal- Nectar Straw Penetration/Aspiration before swallow Pharyngeal Material enters airway, remains ABOVE vocal cords then ejected out;Material does not enter airway Pharyngeal- Thin Teaspoon -- Pharyngeal -- Pharyngeal- Thin Cup -- Pharyngeal -- Pharyngeal- Thin Straw Penetration/Aspiration before swallow;Penetration/Apiration after swallow;Delayed swallow initiation-pyriform sinuses Pharyngeal Material enters airway, passes BELOW cords without attempt by patient to eject out (silent aspiration);Material does not enter airway Pharyngeal- Puree Delayed swallow initiation-vallecula Pharyngeal -- Pharyngeal- Mechanical Soft Delayed swallow initiation-vallecula Pharyngeal -- Pharyngeal- Regular -- Pharyngeal -- Pharyngeal- Multi-consistency -- Pharyngeal -- Pharyngeal- Pill -- Pharyngeal -- Pharyngeal Comment --  CHL IP CERVICAL ESOPHAGEAL PHASE 02/07/2021 Cervical Esophageal Phase Impaired Pudding Teaspoon -- Pudding Cup -- Honey Teaspoon -- Honey Cup -- Nectar Teaspoon -- Nectar Cup -- Nectar Straw Esophageal backflow into the pharynx Thin Teaspoon -- Thin Cup -- Thin Straw -- Puree -- Mechanical Soft -- Regular -- Multi-consistency -- Pill -- Cervical Esophageal Comment -- Claudine Mouton 02/07/2021, 1:43 PM              ECHOCARDIOGRAM COMPLETE  Result Date: 02/04/2021    ECHOCARDIOGRAM REPORT   Patient Name:   TAQUILLA DOWNUM Date of Exam: 02/04/2021  Medical Rec #:  [id number]       Height:       63.5 in Accession #:    [id number]      Weight:       237.0 lb Date of Birth:  01-Dec-1945        BSA:          2.090 m Patient Age:    75 years        BP:           148/81 mmHg Patient Gender: F               HR:           96 bpm. Exam Location:  Inpatient Procedure: 2D Echo, Cardiac Doppler and Color Doppler Indications:    CHF-Acute Diastolic I50.31  History:        Patient has prior history of Echocardiogram examinations, most                 recent 10/18/2019. CHF, Previous Myocardial Infarction and  CAD,                 COPD; Risk Factors:Current Smoker, Dyslipidemia, Diabetes,                 Hypertension and Morbid obesity.  Sonographer:    Roosvelt Maser RDCS Referring Phys: [id number] Angie Fava IMPRESSIONS  1. Left ventricular ejection fraction, by estimation, is 60 to 65%. The left ventricle has normal function. The left ventricle has no regional wall motion abnormalities. There is moderate left ventricular hypertrophy. Left ventricular diastolic parameters are indeterminate.  2. Right ventricular systolic function is normal. The right ventricular size is moderately enlarged. There is moderately elevated pulmonary artery systolic pressure. The estimated right ventricular systolic pressure is 55.4 mmHg.  3. Left atrial size was severely dilated.  4. The mitral valve is normal in structure. No evidence of mitral valve regurgitation. No evidence of mitral stenosis.  5. Tricuspid valve regurgitation is moderate.  6. The aortic valve is normal in structure. Aortic valve regurgitation is not visualized. No aortic stenosis is present.  7. The inferior vena cava is normal in size with greater than 50% respiratory variability, suggesting right atrial pressure of 3 mmHg. Comparison(s): Prior images reviewed side by side. FINDINGS  Left Ventricle: Left ventricular ejection fraction, by estimation, is 60 to 65%. The left ventricle has normal function. The left ventricle has  no regional wall motion abnormalities. The left ventricular internal cavity size was normal in size. There is  moderate left ventricular hypertrophy. Left ventricular diastolic parameters are indeterminate. Right Ventricle: The right ventricular size is moderately enlarged. No increase in right ventricular wall thickness. Right ventricular systolic function is normal. There is moderately elevated pulmonary artery systolic pressure. The tricuspid regurgitant  velocity is 3.62 m/s, and with an assumed right atrial pressure of 3 mmHg, the estimated right ventricular systolic pressure is 55.4 mmHg. Left Atrium: Left atrial size was severely dilated. Right Atrium: Right atrial size was normal in size. Pericardium: There is no evidence of pericardial effusion. Mitral Valve: The mitral valve is normal in structure. No evidence of mitral valve regurgitation. No evidence of mitral valve stenosis. Tricuspid Valve: The tricuspid valve is normal in structure. Tricuspid valve regurgitation is moderate . No evidence of tricuspid stenosis. Aortic Valve: The aortic valve is normal in structure. Aortic valve regurgitation is not visualized. No aortic stenosis is present. Aortic valve mean gradient measures 6.5 mmHg. Aortic valve peak gradient measures 11.7 mmHg. Pulmonic Valve: The pulmonic valve was normal in structure. Pulmonic valve regurgitation is mild. No evidence of pulmonic stenosis. Aorta: The aortic root is normal in size and structure. Venous: The inferior vena cava is normal in size with greater than 50% respiratory variability, suggesting right atrial pressure of 3 mmHg. IAS/Shunts: No atrial level shunt detected by color flow Doppler.  LEFT VENTRICLE PLAX 2D LVIDd:         3.80 cm LVIDs:         2.80 cm LV PW:         1.20 cm LV IVS:        1.50 cm LVOT diam:     2.10 cm LVOT Area:     3.46 cm  RIGHT VENTRICLE RV Basal diam:  4.70 cm RV Mid diam:    4.50 cm LEFT ATRIUM           Index        RIGHT ATRIUM  Index LA diam:      3.30 cm 1.58 cm/m   RA Area:     31.90 cm LA Vol (A2C): 32.0 ml 15.31 ml/m  RA Volume:   138.00 ml 66.01 ml/m LA Vol (A4C): 54.2 ml 25.93 ml/m  AORTIC VALVE AV Vmax:      171.00 cm/s AV Vmean:     118.000 cm/s AV VTI:       0.290 m AV Peak Grad: 11.7 mmHg AV Mean Grad: 6.5 mmHg  AORTA Ao Root diam: 2.70 cm Ao Asc diam:  3.10 cm TRICUSPID VALVE TR Peak grad:   52.4 mmHg TR Vmax:        362.00 cm/s  SHUNTS Systemic Diam: 2.10 cm Donato Schultz MD Electronically signed by Donato Schultz MD Signature Date/Time: 02/04/2021/11:56:40 AM    Final    DG ESOPHAGUS W SINGLE CM (SOL OR THIN BA)  Result Date: 02/08/2021 CLINICAL DATA:  Dysphagia EXAM: ESOPHOGRAM/BARIUM SWALLOW TECHNIQUE: Single contrast examination was performed using  thin barium. FLUOROSCOPY TIME:  Fluoroscopy Time:  2 minute Radiation Exposure Index (if provided by the fluoroscopic device): 47.8 mGy Number of Acquired Spot Images: 0 COMPARISON:  None. FINDINGS: Normal pharyngeal anatomy and motility. Contrast flowed freely through the esophagus without evidence of a mass. Relative narrowing of the distal esophagus just proximal to the esophagogastric junction concerning for a stricture. Normal esophageal mucosa without evidence of irregularity or ulceration. Tertiary contractions of the esophagus as can be seen with esophageal spasm. No evidence of reflux. No definite hiatal hernia was demonstrated. IMPRESSION: Relative narrowing of the distal esophagus just proximal to the esophagogastric junction concerning for a stricture. Tertiary contractions of the esophagus as can be seen with esophageal spasm. Electronically Signed   By: Elige Ko M.D.   On: 02/08/2021 15:19    Microbiology: Recent Results (from the past 240 hour(s))  Blood Culture (routine x 2)     Status: None   Collection Time: 02/03/21 10:12 PM   Specimen: BLOOD RIGHT HAND  Result Value Ref Range Status   Specimen Description BLOOD RIGHT HAND  Final   Special  Requests   Final    BOTTLES DRAWN AEROBIC AND ANAEROBIC Blood Culture adequate volume   Culture   Final    NO GROWTH 5 DAYS Performed at [facility], [address], [place], [place] [address]    Report Status 02/08/2021 FINAL  Final  Blood Culture (routine x 2)     Status: Abnormal   Collection Time: 02/03/21 10:24 PM   Specimen: BLOOD  Result Value Ref Range Status   Specimen Description BLOOD BLOOD RIGHT FOREARM  Final   Special Requests   Final    BOTTLES DRAWN AEROBIC AND ANAEROBIC Blood Culture results may not be optimal due to an inadequate volume of blood received in culture bottles   Culture  Setup Time   Final    GRAM POSITIVE COCCI IN CLUSTERS ANAEROBIC BOTTLE ONLY CRITICAL RESULT CALLED TO, READ BACK BY AND VERIFIED WITH: PHARMD JAMES LEDFORD 02/04/21@21 :46 BY TW    Culture (A)  Final    STAPHYLOCOCCUS HOMINIS THE SIGNIFICANCE OF ISOLATING THIS ORGANISM FROM A SINGLE SET OF BLOOD CULTURES WHEN MULTIPLE SETS ARE DRAWN IS UNCERTAIN. PLEASE NOTIFY THE MICROBIOLOGY DEPARTMENT WITHIN ONE WEEK IF SPECIATION AND SENSITIVITIES ARE REQUIRED. Performed at [facility], [address], [place], [place] [address]    Report Status 02/06/2021 FINAL  Final  Resp Panel by RT-PCR (Flu A&B, Covid) Nasopharyngeal  Swab     Status: None   Collection Time: 02/03/21 10:24 PM   Specimen: Nasopharyngeal Swab; Nasopharyngeal(NP) swabs in vial transport medium  Result Value Ref Range Status   SARS Coronavirus 2 by RT PCR NEGATIVE NEGATIVE Final    Comment: (NOTE) SARS-CoV-2 target nucleic acids are NOT DETECTED.  The SARS-CoV-2 RNA is generally detectable in upper respiratory specimens during the acute phase of infection. The lowest concentration of SARS-CoV-2 viral copies this assay can detect is 138 copies/mL. A negative result does not preclude SARS-Cov-2 infection and should not be used as the sole basis for treatment or other patient management decisions. A negative result  may occur with  improper specimen collection/handling, submission of specimen other than nasopharyngeal swab, presence of viral mutation(s) within the areas targeted by this assay, and inadequate number of viral copies(<138 copies/mL). A negative result must be combined with clinical observations, patient history, and epidemiological information. The expected result is Negative.  Fact Sheet for Patients:  BloggerCourse.com  Fact Sheet for Healthcare Providers:  SeriousBroker.it  This test is no t yet approved or cleared by the [place] FDA and  has been authorized for detection and/or diagnosis of SARS-CoV-2 by FDA under an Emergency Use Authorization (EUA). This EUA will remain  in effect (meaning this test can be used) for the duration of the COVID-19 declaration under Section 564(b)(1) of the Act, 21 U.S.C.section 360bbb-3(b)(1), unless the authorization is terminated  or revoked sooner.       Influenza A by PCR NEGATIVE NEGATIVE Final   Influenza B by PCR NEGATIVE NEGATIVE Final    Comment: (NOTE) The Xpert Xpress SARS-CoV-2/FLU/RSV plus assay is intended as an aid in the diagnosis of influenza from Nasopharyngeal swab specimens and should not be used as a sole basis for treatment. Nasal washings and aspirates are unacceptable for Xpert Xpress SARS-CoV-2/FLU/RSV testing.  Fact Sheet for Patients: BloggerCourse.com  Fact Sheet for Healthcare Providers: SeriousBroker.it  This test is not yet approved or cleared by the [place] FDA and has been authorized for detection and/or diagnosis of SARS-CoV-2 by FDA under an Emergency Use Authorization (EUA). This EUA will remain in effect (meaning this test can be used) for the duration of the COVID-19 declaration under Section 564(b)(1) of the Act, 21 U.S.C. section 360bbb-3(b)(1), unless the authorization is terminated  or revoked.  Performed at [facility], [address], [place], [place] [address]   Blood Culture ID Panel (Reflexed)     Status: Abnormal   Collection Time: 02/03/21 10:24 PM  Result Value Ref Range Status   Enterococcus faecalis NOT DETECTED NOT DETECTED Final   Enterococcus Faecium NOT DETECTED NOT DETECTED Final   Listeria monocytogenes NOT DETECTED NOT DETECTED Final   Staphylococcus species DETECTED (A) NOT DETECTED Final    Comment: CRITICAL RESULT CALLED TO, READ BACK BY AND VERIFIED WITH: PHARMD JAMES LEDFORD 02/04/21@21 :46 BY TW    Staphylococcus aureus (BCID) NOT DETECTED NOT DETECTED Final   Staphylococcus epidermidis NOT DETECTED NOT DETECTED Final   Staphylococcus lugdunensis NOT DETECTED NOT DETECTED Final   Streptococcus species NOT DETECTED NOT DETECTED Final   Streptococcus agalactiae NOT DETECTED NOT DETECTED Final   Streptococcus pneumoniae NOT DETECTED NOT DETECTED Final   Streptococcus pyogenes NOT DETECTED NOT DETECTED Final   A.calcoaceticus-baumannii NOT DETECTED NOT DETECTED Final   Bacteroides fragilis NOT DETECTED NOT DETECTED Final   Enterobacterales NOT DETECTED NOT DETECTED Final   Enterobacter cloacae complex NOT DETECTED NOT DETECTED Final  Escherichia coli NOT DETECTED NOT DETECTED Final   Klebsiella aerogenes NOT DETECTED NOT DETECTED Final   Klebsiella oxytoca NOT DETECTED NOT DETECTED Final   Klebsiella pneumoniae NOT DETECTED NOT DETECTED Final   Proteus species NOT DETECTED NOT DETECTED Final   Salmonella species NOT DETECTED NOT DETECTED Final   Serratia marcescens NOT DETECTED NOT DETECTED Final   Haemophilus influenzae NOT DETECTED NOT DETECTED Final   Neisseria meningitidis NOT DETECTED NOT DETECTED Final   Pseudomonas aeruginosa NOT DETECTED NOT DETECTED Final   Stenotrophomonas maltophilia NOT DETECTED NOT DETECTED Final   Candida albicans NOT DETECTED NOT DETECTED Final   Candida auris NOT DETECTED NOT DETECTED Final    Candida glabrata NOT DETECTED NOT DETECTED Final   Candida krusei NOT DETECTED NOT DETECTED Final   Candida parapsilosis NOT DETECTED NOT DETECTED Final   Candida tropicalis NOT DETECTED NOT DETECTED Final   Cryptococcus neoformans/gattii NOT DETECTED NOT DETECTED Final    Comment: Performed at [facility], [address], [place], [place] [address]  Urine Culture     Status: Abnormal   Collection Time: 02/04/21  6:30 AM   Specimen: In/Out Cath Urine  Result Value Ref Range Status   Specimen Description IN/OUT CATH URINE  Final   Special Requests   Final    NONE Performed at [facility], [address] [place], [place] [address]    Culture (A)  Final    40,000 COLONIES/mL METHICILLIN RESISTANT STAPHYLOCOCCUS AUREUS   Report Status 02/06/2021 FINAL  Final   Organism ID, Bacteria METHICILLIN RESISTANT STAPHYLOCOCCUS AUREUS (A)  Final      Susceptibility   Methicillin resistant staphylococcus aureus - MIC*    CIPROFLOXACIN >=8 RESISTANT Resistant     GENTAMICIN <=0.5 SENSITIVE Sensitive     NITROFURANTOIN <=16 SENSITIVE Sensitive     OXACILLIN >=4 RESISTANT Resistant     TETRACYCLINE <=1 SENSITIVE Sensitive     VANCOMYCIN 1 SENSITIVE Sensitive     TRIMETH/SULFA >=320 RESISTANT Resistant     CLINDAMYCIN <=0.25 SENSITIVE Sensitive     RIFAMPIN <=0.5 SENSITIVE Sensitive     Inducible Clindamycin NEGATIVE Sensitive     * 40,000 COLONIES/mL METHICILLIN RESISTANT STAPHYLOCOCCUS AUREUS  Resp Panel by RT-PCR (Flu A&B, Covid) Nasopharyngeal Swab     Status: None   Collection Time: 02/11/21 12:26 PM   Specimen: Nasopharyngeal Swab; Nasopharyngeal(NP) swabs in vial transport medium  Result Value Ref Range Status   SARS Coronavirus 2 by RT PCR NEGATIVE NEGATIVE Final    Comment: (NOTE) SARS-CoV-2 target nucleic acids are NOT DETECTED.  The SARS-CoV-2 RNA is generally detectable in upper respiratory specimens during the acute phase of infection. The lowest concentration of  SARS-CoV-2 viral copies this assay can detect is 138 copies/mL. A negative result does not preclude SARS-Cov-2 infection and should not be used as the sole basis for treatment or other patient management decisions. A negative result may occur with  improper specimen collection/handling, submission of specimen other than nasopharyngeal swab, presence of viral mutation(s) within the areas targeted by this assay, and inadequate number of viral copies(<138 copies/mL). A negative result must be combined with clinical observations, patient history, and epidemiological information. The expected result is Negative.  Fact Sheet for Patients:  BloggerCourse.com  Fact Sheet for Healthcare Providers:  SeriousBroker.it  This test is no t yet approved or cleared by the [place] FDA and  has been authorized for detection and/or diagnosis of SARS-CoV-2 by FDA under an Emergency Use  Authorization (EUA). This EUA will remain  in effect (meaning this test can be used) for the duration of the COVID-19 declaration under Section 564(b)(1) of the Act, 21 U.S.C.section 360bbb-3(b)(1), unless the authorization is terminated  or revoked sooner.       Influenza A by PCR NEGATIVE NEGATIVE Final   Influenza B by PCR NEGATIVE NEGATIVE Final    Comment: (NOTE) The Xpert Xpress SARS-CoV-2/FLU/RSV plus assay is intended as an aid in the diagnosis of influenza from Nasopharyngeal swab specimens and should not be used as a sole basis for treatment. Nasal washings and aspirates are unacceptable for Xpert Xpress SARS-CoV-2/FLU/RSV testing.  Fact Sheet for Patients: BloggerCourse.com  Fact Sheet for Healthcare Providers: SeriousBroker.it  This test is not yet approved or cleared by the [place] FDA and has been authorized for detection and/or diagnosis of SARS-CoV-2 by FDA under an Emergency Use  Authorization (EUA). This EUA will remain in effect (meaning this test can be used) for the duration of the COVID-19 declaration under Section 564(b)(1) of the Act, 21 U.S.C. section 360bbb-3(b)(1), unless the authorization is terminated or revoked.  Performed at [facility], [address], [place], [place] [address]      Labs: Basic Metabolic Panel: Recent Labs  Lab 02/07/21 0148 02/08/21 0151 02/09/21 0249 02/10/21 0433 02/11/21 0448  NA 152* 141 138 138 139  K 4.7 5.0 4.4 4.4 4.5  CL 112* 100 96* 95* 97*  CO2 31 33* 38* 33* 32  GLUCOSE 280* 198* 227* 206* 214*  BUN 35* 28* 29* 21 14  CREATININE 0.87 0.75 0.81 0.79 0.75  CALCIUM 9.8 9.8 9.7 9.6 9.4   Liver Function Tests: No results for input(s): AST, ALT, ALKPHOS, BILITOT, PROT, ALBUMIN in the last 168 hours. No results for input(s): LIPASE, AMYLASE in the last 168 hours. No results for input(s): AMMONIA in the last 168 hours. CBC: Recent Labs  Lab 02/06/21 0204 02/07/21 0548 02/08/21 0151 02/09/21 0249 02/10/21 0433  WBC 12.4* 17.4* 15.5* 16.8* 12.6*  HGB 14.2 14.4 14.4 14.1 14.0  HCT 49.9* 50.1* 50.6* 47.8* 48.1*  MCV 88.6 85.8 87.5 85.7 85.7  PLT 230 212 207 248 131*   Cardiac Enzymes: No results for input(s): CKTOTAL, CKMB, CKMBINDEX, TROPONINI in the last 168 hours. BNP: BNP (last 3 results) Recent Labs    02/03/21 2122 02/05/21 0207  BNP 725.1* 417.3*    ProBNP (last 3 results) No results for input(s): PROBNP in the last 8760 hours.  CBG: Recent Labs  Lab 02/11/21 1454 02/11/21 1630 02/11/21 2053 02/12/21 0747 02/12/21 1113  GLUCAP 442* 326* 203* 169* 211*       Signed:  Meredeth Ide MD.  [facility] 02/12/2021, 1:49 PM

## 2021-02-12 NOTE — TOC Transition Note (Addendum)
Transition of Care [facility]) - CM/SW Discharge Note   Patient Details  Name: Courtney [name] MRN: [id number] Date of Birth: 09/02/1945  Transition of [facility]) CM/SW Contact:  Carley Hammed, LCSWA Phone Number: 02/13/2021, 8:02 AM   Clinical Narrative:    Pt to be transported to [facility] via [place]. Nurse to call report to [phone], ask for 2nd floor nurses station. Please notify husband when pt is picked up by transportation.  Final next level of care: Skilled Nursing Facility Barriers to Discharge: Barriers Resolved   Patient Goals and CMS Choice Patient states their goals for this hospitalization and ongoing recovery are:: "get healthy"      Discharge Placement              Patient chooses bed at:  [facility]) Patient to be transferred to facility by: [facility] Name of family member notified: Remi Haggard Patient and family notified of of transfer: 02/12/21  Discharge Plan and Services     Post Acute Care Choice: Skilled Nursing Facility                               Social Determinants of Health (SDOH) Interventions     Readmission Risk Interventions No flowsheet data found.

## 2021-02-12 NOTE — Plan of Care (Signed)
Pending SNF bed. Social worker following. CHG bath given. Pt is stable.    Problem: Education: Goal: Knowledge of General Education information will improve Description: Including pain rating scale, medication(s)/side effects and non-pharmacologic comfort measures Outcome: Progressing   Problem: Health Behavior/Discharge Planning: Goal: Ability to manage health-related needs will improve Outcome: Progressing   Problem: Clinical Measurements: Goal: Ability to maintain clinical measurements within normal limits will improve Outcome: Progressing Goal: Will remain free from infection Outcome: Progressing Goal: Diagnostic test results will improve Outcome: Progressing Goal: Respiratory complications will improve Outcome: Progressing Goal: Cardiovascular complication will be avoided Outcome: Progressing   Problem: Activity: Goal: Risk for activity intolerance will decrease Outcome: Progressing   Problem: Nutrition: Goal: Adequate nutrition will be maintained Outcome: Progressing   Problem: Coping: Goal: Level of anxiety will decrease Outcome: Progressing   Problem: Elimination: Goal: Will not experience complications related to bowel motility Outcome: Progressing Goal: Will not experience complications related to urinary retention Outcome: Progressing   Problem: Pain Managment: Goal: General experience of comfort will improve Outcome: Progressing   Problem: Safety: Goal: Ability to remain free from injury will improve Outcome: Progressing   Problem: Skin Integrity: Goal: Risk for impaired skin integrity will decrease Outcome: Progressing

## 2021-02-12 NOTE — Discharge Summary (Addendum)
Physician Discharge Summary  Courtney [name] HDQ:[id number] DOB: 05/05/1945 DOA: 02/03/2021  PCP: Jordan Hawks, PA-C  Admit date: 02/03/2021 Discharge date: 02/13/2021  Time spent: 60 minutes  Recommendations for Outpatient Follow-up:  Follow-up gastroenterology in 2 weeks Continue Protonix 40 mg p.o. twice daily for 8 weeks Continue doxycycline 100 mg p.o. twice daily for 5 days   Discharge Diagnoses:  Principal Problem:   Acute respiratory failure with hypoxia (HCC) Active Problems:   Hypertension   Acute exacerbation of chronic obstructive pulmonary disease (COPD) (HCC)   Acute on chronic diastolic CHF (congestive heart failure) (HCC)   Severe sepsis (HCC)   CAP (community acquired pneumonia)   Acute metabolic encephalopathy   DM2 (diabetes mellitus, type 2) (HCC)   Dysphagia   Discharge Condition: Stable  Diet recommendation: Carb modified diet  Filed Weights   02/09/21 1153 02/10/21 0500 02/11/21 0323  Weight: 102 kg 107 kg 106.9 kg    History of present illness:  75 year old female with medical history of COPD, diabetes mellitus type 2, hypertension, hyperlipidemia, chronic diastolic heart failure who was admitted to [facility] on 02/03/2021 with acute hypoxemic respiratory distress after presenting from skilled nursing facility to ED for evaluation of increased somnolence.  O2 sats were 88% on room air, chest x-ray showed vascular congestion, she had diffuse wheezing and diminished air entry.  She was treated with steroids with significant improvement of her COPD.  Work-up also showed community-acquired pneumonia.  She had dysphagia with work-up significant for esophageal stricture, EGD was planned per GI.  Hospital Course:   Acute hypoxemic respiratory failure -Secondary to COPD exacerbation/pneumonia -Significantly improved, patient not requiring oxygen anymore -Treated with Rocephin and Zithromax for 5 days -Chest x-ray showed left lower opacity,  pleural effusion and volume overload -Patient remains with significant upper airway congestion, she was encouraged with -DuoNeb nebulizers every 6 hours -Started on doxycycline 100 mg p.o. twice daily for 5 days   Acute on chronic diastolic heart failure -In the context of documented history of chronic diastolic heart failure -Echocardiogram from June 2021 showed grade 2 diastolic dysfunction with EF of 60 to 65% -Recurrent evidence of pulmonary edema Started on Lasix 40 mg p.o. daily    Dysphagia -Esophagram showed esophageal stricture -Underwent EGD which showed Barrett's esophagus -Biopsy was obtained, patient will follow up with GI as outpatient -May benefit for esophageal manometry, GI to discuss as outpatient -Started on twice daily PPI for 8 weeks -As per GI   Brief GI Path Note: Esophageal biopsies showed squamocolumnar mucosa without signs of metaplasia/dysplasia/malignancy, and stomach biopsies were negative for H pylori. I do not feel that these findings warrant further endoscopic surveillance. GI follow up has already been scheduled.     Hypernatremia -Resolved with D5W    MRSA UTI -Urine culture grew MRSA Treated with Zyvox for 3 days   Acute metabolic encephalopathy -Likely from UTI as above -TSH is low at 0.2, likely sick euthyroid syndrome -Check TSH in 6 to 8 weeks -CT head showed no acute findings   Diabetes mellitus type 2 -Hemoglobin A1c 7.8% -CBG well controlled -Continue Lantus, sliding scale insulin NovoLog -Continue metformin   Hypertension -Blood pressure is stable -Continue Norvasc, Toprol-XL      Procedures: EGD  Consultations: Gastroenterology  Discharge Exam: Vitals:   02/12/21 1116 02/12/21 1324  BP: (!) 132/59   Pulse: 83   Resp: 20   Temp: 98.1 F (36.7 C)   SpO2: 100% 99%    General: Appears  in no acute distress Cardiovascular: S1-S2, regular, no murmur auscultated Respiratory: Clear to auscultation  bilaterally  Discharge Instructions   Discharge Instructions     Diet - low sodium heart healthy   Complete by: As directed    Increase activity slowly   Complete by: As directed       Allergies as of 02/12/2021       Reactions   Penicillins Other (See Comments)   Did it involve swelling of the face/tongue/throat, SOB, or low BP? No Did it involve sudden or severe rash/hives, skin peeling, or any reaction on the inside of your mouth or nose? Yes Did you need to seek medical attention at a hospital or doctor's office? yes When did it last happen?      more than 10 years If all above answers are "NO", may proceed with cephalosporin use.        Medication List     STOP taking these medications    Admelog SoloStar 100 UNIT/ML KwikPen Generic drug: insulin lispro   Ensure Max Protein Liqd   famotidine 20 MG tablet Commonly known as: PEPCID   hydrOXYzine 25 MG tablet Commonly known as: ATARAX/VISTARIL   nutrition supplement (JUVEN) Pack   predniSONE 20 MG tablet Commonly known as: DELTASONE       TAKE these medications    acetaminophen 650 MG CR tablet Commonly known as: TYLENOL Take 650 mg by mouth every 6 (six) hours as needed for pain.   amLODipine 10 MG tablet Commonly known as: NORVASC Take 1 tablet (10 mg total) by mouth daily.   arformoterol 15 MCG/2ML Nebu Commonly known as: BROVANA Take 2 mLs (15 mcg total) by nebulization 2 (two) times daily.   aspirin 81 MG EC tablet Take 1 tablet (81 mg total) by mouth daily.   atorvastatin 80 MG tablet Commonly known as: LIPITOR Take 80 mg by mouth at bedtime.   budesonide 0.25 MG/2ML nebulizer solution Commonly known as: PULMICORT Take 2 mLs (0.25 mg total) by nebulization 2 (two) times daily.   D-5000 125 MCG (5000 UT) Tabs Generic drug: Cholecalciferol Take 1 tablet by mouth daily.   doxycycline 100 MG tablet Commonly known as: VIBRA-TABS Take 1 tablet (100 mg total) by mouth every 12  (twelve) hours for 5 days.   ferrous sulfate 325 (65 FE) MG tablet Take 1 tablet (325 mg total) by mouth 2 (two) times daily with a meal. What changed: when to take this   fluticasone 50 MCG/ACT nasal spray Commonly known as: FLONASE Place 1 spray into both nostrils daily.   furosemide 40 MG tablet Commonly known as: LASIX Take 1 tablet (40 mg total) by mouth daily. Start taking on: February 13, 2021   guaiFENesin 600 MG 12 hr tablet Commonly known as: MUCINEX Take 2 tablets (1,200 mg total) by mouth 2 (two) times daily for 5 days.   insulin aspart 100 UNIT/ML injection Commonly known as: novoLOG Inject 0-16 Units into the skin See admin instructions. Inject before meals and at bedtime, per sliding scale: 0-150 = 0U 151-200 = 4U 201-250 = 6U 251-300 = 8U 301-350 = 10U 351-400 = 14U 401-450U = 16U   ipratropium-albuterol 0.5-2.5 (3) MG/3ML Soln Commonly known as: DUONEB Take 3 mLs by nebulization every 6 (six) hours as needed.   Lantus SoloStar 100 UNIT/ML Solostar Pen Generic drug: insulin glargine Inject 21 Units into the skin 2 (two) times daily.   melatonin 3 MG Tabs tablet Take 1 tablet (3 mg total)  by mouth at bedtime.   memantine 5 MG tablet Commonly known as: NAMENDA Take 5 mg by mouth at bedtime.   metFORMIN 1000 MG tablet Commonly known as: GLUCOPHAGE Take 1,000 mg by mouth 2 (two) times daily with a meal.   metoprolol succinate 25 MG 24 hr tablet Commonly known as: TOPROL-XL Take 25 mg by mouth daily.   pantoprazole 40 MG tablet Commonly known as: PROTONIX Take 1 tablet (40 mg total) by mouth 2 (two) times daily.   polyethylene glycol 17 g packet Commonly known as: MIRALAX / GLYCOLAX Take 17 g by mouth daily.   saccharomyces boulardii 250 MG capsule Commonly known as: FLORASTOR Take 250 mg by mouth 2 (two) times daily.   senna 8.6 MG Tabs tablet Commonly known as: SENOKOT Take 2 tablets by mouth daily.   sertraline 50 MG tablet Commonly  known as: ZOLOFT Take 50 mg by mouth daily.   tiotropium 18 MCG inhalation capsule Commonly known as: SPIRIVA Place 18 mcg into inhaler and inhale daily.       Allergies  Allergen Reactions   Penicillins Other (See Comments)    Did it involve swelling of the face/tongue/throat, SOB, or low BP? No Did it involve sudden or severe rash/hives, skin peeling, or any reaction on the inside of your mouth or nose? Yes Did you need to seek medical attention at a hospital or doctor's office? yes When did it last happen?      more than 10 years If all above answers are "NO", may proceed with cephalosporin use.     Follow-up Information     Imogene Burn, MD Follow up in 2 week(s).   Specialty: Gastroenterology Contact information: [address] [place] [address] [place] [address] [phone]                  The results of significant diagnostics from this hospitalization (including imaging, microbiology, ancillary and laboratory) are listed below for reference.    Significant Diagnostic Studies: CT HEAD WO CONTRAST ( )  Result Date: 02/07/2021 CLINICAL DATA:  Mental status change EXAM: CT HEAD WITHOUT CONTRAST TECHNIQUE: Contiguous axial images were obtained from the base of the skull through the vertex without intravenous contrast. COMPARISON:  11/01/2018 FINDINGS: Brain: No evidence of acute infarction, hemorrhage, cerebral edema, mass, mass effect, or midline shift. Periventricular white matter changes, likely the sequela of chronic small vessel ischemic disease. No extra-axial fluid collection. Vascular: No hyperdense vessel or unexpected calcification. Skull: Normal. Negative for fracture or focal lesion. Sinuses/Orbits: No acute finding. Other: The mastoid air cells are well aerated. IMPRESSION: No acute intracranial process. Electronically Signed   By: Wiliam Ke M.D.   On: 02/07/2021 11:37   DG Chest Port 1 View  Result Date: 02/05/2021 CLINICAL DATA:  Altered mental  status, shortness of breath. EXAM: PORTABLE CHEST 1 VIEW COMPARISON:  February 03, 2021 FINDINGS: The cardiomediastinal silhouette is enlarged. Calcific atherosclerotic disease and tortuosity of the aorta. Streaky peribronchial opacities. Left lower lobe atelectasis versus airspace consolidation. Probable right pleural effusion. Osseous structures are without acute abnormality. Soft tissues are grossly normal. IMPRESSION: 1. Left lower lobe atelectasis versus airspace consolidation. 2. Probable right pleural effusion. 3. Interstitial pulmonary edema. Electronically Signed   By: Ted Mcalpine M.D.   On: 02/05/2021 14:50   DG Chest Port 1 View  Result Date: 02/03/2021 CLINICAL DATA:  Questionable sepsis. EXAM: PORTABLE CHEST 1 VIEW COMPARISON:  Chest x-ray 10/19/2019. FINDINGS: Heart is enlarged, unchanged. There is central  pulmonary vascular congestion which has increased from prior. Interstitial opacity are present in the lung bases. There is a band of atelectasis in the left mid lung. There is no pleural effusion or pneumothorax identified. No acute fractures. IMPRESSION: 1. Cardiomegaly with central pulmonary vascular congestion/mild interstitial edema. Electronically Signed   By: Darliss Cheney M.D.   On: 02/03/2021 22:27   DG Swallowing Func-Speech Pathology  Result Date: 02/07/2021 Table formatting from the original result was not included. Objective Swallowing Evaluation: Type of Study: MBS-Modified Barium Swallow Study  Patient Details Name: Courtney [name] MRN: [id number] Date of Birth: [birth year] Today's Date: 02/07/2021 Time: SLP Start Time (ACUTE ONLY): 1230 -SLP Stop Time (ACUTE ONLY): 1250 SLP Time Calculation (min) (ACUTE ONLY): 20 min Past Medical History: Past Medical History: Diagnosis Date  Anemia   Arthritis   Asthma   CAD (coronary artery disease)   COPD (chronic obstructive pulmonary disease) (HCC)   Diabetes mellitus   Hyperlipidemia   Hypertension   Myocardial infarct (HCC)    Postmenopausal atrophic vaginitis   Restless leg syndrome  Past Surgical History: Past Surgical History: Procedure Laterality Date  CHOLECYSTECTOMY  1970's  INCISION AND DRAINAGE ABSCESS Right 06/27/2018  Procedure: 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.;  Surgeon: Romie Levee, MD;  Location: [facility];  Service: General;  Laterality: Right;  INCISION AND DRAINAGE PERIRECTAL ABSCESS N/A 06/25/2018  Procedure: IRRIGATION AND DEBRIDEMENT PERIRECTAL ABCESS;  Surgeon: Romie Levee, MD;  Location: [facility];  Service: General;  Laterality: N/A;  IRRIGATION AND DEBRIDEMENT ABSCESS N/A 07/01/2018  Procedure: IRRIGATION AND DEBRIDEMENT ABSCESS;  Surgeon: Emelia Loron, MD;  Location: [facility];  Service: General;  Laterality: N/A;  OTHER SURGICAL HISTORY  1970's  hysterectomy  WOUND DEBRIDEMENT N/A 07/03/2018  Procedure: DEBRIDEMENT PELVIS WITH DRESSING CHANGE;  Surgeon: Emelia Loron, MD;  Location: [facility];  Service: General;  Laterality: N/A; HPI: AZIA TOUTANT is a 75 y.o. female with medical history significant for COPD, type 2 diabetes mellitus, hypertension, hyperlipidemia, chronic diastolic heart failure, who is admitted to [facility] on 02/03/2021 with acute hypoxic respiratory distress after presenting from snf to [facility] ED for evaluation of increased somnolence.  ED work-up significant for temperature of 99.3, oxygen saturation 88% on room air, chest x-ray significant for vascular congestion, she is with diffuse wheezing and diminished air entry, admitted for further management.Pt coughing after [place].  No data recorded Assessment / Plan / Recommendation CHL IP CLINICAL IMPRESSIONS 02/07/2021 Clinical Impression Pt demonstrates a primairly oral dysphagia with lingual rocking of bolus and decreased bolus cohesion with spillage of the head of bolus with tail remaining in oral cavity. There are instances of aspiration before the swallow with thin and also after as pt transits the remaining  bolus to an open airway. Sensation is inconsistent and often delayed. Pt also observed to have backflow of liquids from esophagus to pharynx with belching and esophageal sweep shows moderate stasis in cervical and thoracic esophageal column. Pt has a moderate risk of postprandial aspiration. Recommend ongoing use of nectar thick liquids and pureed solids with esophageal precautions, primairly slow rate and upright positioning and sustaining upright position after meals. Will follow for potential for uprgrade which may be possible if mentation improves as pt was still weak and inattentive today. SLP Visit Diagnosis Dysphagia, oral phase (R13.11) Attention and concentration deficit following -- Frontal lobe and executive function deficit following -- Impact on safety and function Mild aspiration risk;Risk for inadequate nutrition/hydration   CHL IP TREATMENT  RECOMMENDATION 02/07/2021 Treatment Recommendations Therapy as outlined in treatment plan below   Prognosis 02/07/2021 Prognosis for Safe Diet Advancement Fair Barriers to Reach Goals Cognitive deficits Barriers/Prognosis Comment -- CHL IP DIET RECOMMENDATION 02/07/2021 SLP Diet Recommendations Dysphagia 1 (Puree) solids;Nectar thick liquid Liquid Administration via Cup;Straw Medication Administration Crushed with puree Compensations Slow rate;Small sips/bites Postural Changes Seated upright at 90 degrees;Remain semi-upright after after feeds/meals (Comment)   No flowsheet data found.  CHL IP FOLLOW UP RECOMMENDATIONS 02/07/2021 Follow up Recommendations Skilled Nursing facility   [facility] IP FREQUENCY AND DURATION 02/07/2021 Speech Therapy Frequency (ACUTE ONLY) min 2x/week Treatment Duration 2 weeks      CHL IP ORAL PHASE 02/07/2021 Oral Phase Impaired Oral - Pudding Teaspoon -- Oral - Pudding Cup -- Oral - Honey Teaspoon -- Oral - Honey Cup -- Oral - Nectar Teaspoon -- Oral - Nectar Cup -- Oral - Nectar Straw Decreased bolus cohesion;Delayed oral transit;Premature  spillage Oral - Thin Teaspoon -- Oral - Thin Cup -- Oral - Thin Straw Premature spillage;Decreased bolus cohesion;Delayed oral transit;Lingual pumping Oral - Puree Lingual pumping;Delayed oral transit;Decreased bolus cohesion;Premature spillage Oral - Mech Soft Premature spillage;Decreased bolus cohesion;Delayed oral transit;Lingual pumping Oral - Regular -- Oral - Multi-Consistency -- Oral - Pill -- Oral Phase - Comment --  CHL IP PHARYNGEAL PHASE 02/07/2021 Pharyngeal Phase Impaired Pharyngeal- Pudding Teaspoon -- Pharyngeal -- Pharyngeal- Pudding Cup -- Pharyngeal -- Pharyngeal- Honey Teaspoon -- Pharyngeal -- Pharyngeal- Honey Cup -- Pharyngeal -- Pharyngeal- Nectar Teaspoon -- Pharyngeal -- Pharyngeal- Nectar Cup -- Pharyngeal -- Pharyngeal- Nectar Straw Penetration/Aspiration before swallow Pharyngeal Material enters airway, remains ABOVE vocal cords then ejected out;Material does not enter airway Pharyngeal- Thin Teaspoon -- Pharyngeal -- Pharyngeal- Thin Cup -- Pharyngeal -- Pharyngeal- Thin Straw Penetration/Aspiration before swallow;Penetration/Apiration after swallow;Delayed swallow initiation-pyriform sinuses Pharyngeal Material enters airway, passes BELOW cords without attempt by patient to eject out (silent aspiration);Material does not enter airway Pharyngeal- Puree Delayed swallow initiation-vallecula Pharyngeal -- Pharyngeal- Mechanical Soft Delayed swallow initiation-vallecula Pharyngeal -- Pharyngeal- Regular -- Pharyngeal -- Pharyngeal- Multi-consistency -- Pharyngeal -- Pharyngeal- Pill -- Pharyngeal -- Pharyngeal Comment --  CHL IP CERVICAL ESOPHAGEAL PHASE 02/07/2021 Cervical Esophageal Phase Impaired Pudding Teaspoon -- Pudding Cup -- Honey Teaspoon -- Honey Cup -- Nectar Teaspoon -- Nectar Cup -- Nectar Straw Esophageal backflow into the pharynx Thin Teaspoon -- Thin Cup -- Thin Straw -- Puree -- Mechanical Soft -- Regular -- Multi-consistency -- Pill -- Cervical Esophageal Comment --  Claudine Mouton 02/07/2021, 1:43 PM              ECHOCARDIOGRAM COMPLETE  Result Date: 02/04/2021    ECHOCARDIOGRAM REPORT   Patient Name:   ARDINE IACOVELLI Date of Exam: 02/04/2021 Medical Rec #:  [id number]       Height:       63.5 in Accession #:    [id number]      Weight:       237.0 lb Date of Birth:  04-Oct-1945        BSA:          2.090 m Patient Age:    75 years        BP:           148/81 mmHg Patient Gender: F               HR:           96 bpm. Exam Location:  Inpatient Procedure: 2D Echo, Cardiac Doppler and Color Doppler Indications:  CHF-Acute Diastolic I50.31  History:        Patient has prior history of Echocardiogram examinations, most                 recent 10/18/2019. CHF, Previous Myocardial Infarction and CAD,                 COPD; Risk Factors:Current Smoker, Dyslipidemia, Diabetes,                 Hypertension and Morbid obesity.  Sonographer:    Roosvelt Maser RDCS Referring Phys: [id number] Angie Fava IMPRESSIONS  1. Left ventricular ejection fraction, by estimation, is 60 to 65%. The left ventricle has normal function. The left ventricle has no regional wall motion abnormalities. There is moderate left ventricular hypertrophy. Left ventricular diastolic parameters are indeterminate.  2. Right ventricular systolic function is normal. The right ventricular size is moderately enlarged. There is moderately elevated pulmonary artery systolic pressure. The estimated right ventricular systolic pressure is 55.4 mmHg.  3. Left atrial size was severely dilated.  4. The mitral valve is normal in structure. No evidence of mitral valve regurgitation. No evidence of mitral stenosis.  5. Tricuspid valve regurgitation is moderate.  6. The aortic valve is normal in structure. Aortic valve regurgitation is not visualized. No aortic stenosis is present.  7. The inferior vena cava is normal in size with greater than 50% respiratory variability, suggesting right atrial pressure of 3 mmHg.  Comparison(s): Prior images reviewed side by side. FINDINGS  Left Ventricle: Left ventricular ejection fraction, by estimation, is 60 to 65%. The left ventricle has normal function. The left ventricle has no regional wall motion abnormalities. The left ventricular internal cavity size was normal in size. There is  moderate left ventricular hypertrophy. Left ventricular diastolic parameters are indeterminate. Right Ventricle: The right ventricular size is moderately enlarged. No increase in right ventricular wall thickness. Right ventricular systolic function is normal. There is moderately elevated pulmonary artery systolic pressure. The tricuspid regurgitant  velocity is 3.62 m/s, and with an assumed right atrial pressure of 3 mmHg, the estimated right ventricular systolic pressure is 55.4 mmHg. Left Atrium: Left atrial size was severely dilated. Right Atrium: Right atrial size was normal in size. Pericardium: There is no evidence of pericardial effusion. Mitral Valve: The mitral valve is normal in structure. No evidence of mitral valve regurgitation. No evidence of mitral valve stenosis. Tricuspid Valve: The tricuspid valve is normal in structure. Tricuspid valve regurgitation is moderate . No evidence of tricuspid stenosis. Aortic Valve: The aortic valve is normal in structure. Aortic valve regurgitation is not visualized. No aortic stenosis is present. Aortic valve mean gradient measures 6.5 mmHg. Aortic valve peak gradient measures 11.7 mmHg. Pulmonic Valve: The pulmonic valve was normal in structure. Pulmonic valve regurgitation is mild. No evidence of pulmonic stenosis. Aorta: The aortic root is normal in size and structure. Venous: The inferior vena cava is normal in size with greater than 50% respiratory variability, suggesting right atrial pressure of 3 mmHg. IAS/Shunts: No atrial level shunt detected by color flow Doppler.  LEFT VENTRICLE PLAX 2D LVIDd:         3.80 cm LVIDs:         2.80 cm LV PW:          1.20 cm LV IVS:        1.50 cm LVOT diam:     2.10 cm LVOT Area:     3.46 cm  RIGHT VENTRICLE RV Basal  diam:  4.70 cm RV Mid diam:    4.50 cm LEFT ATRIUM           Index        RIGHT ATRIUM           Index LA diam:      3.30 cm 1.58 cm/m   RA Area:     31.90 cm LA Vol (A2C): 32.0 ml 15.31 ml/m  RA Volume:   138.00 ml 66.01 ml/m LA Vol (A4C): 54.2 ml 25.93 ml/m  AORTIC VALVE AV Vmax:      171.00 cm/s AV Vmean:     118.000 cm/s AV VTI:       0.290 m AV Peak Grad: 11.7 mmHg AV Mean Grad: 6.5 mmHg  AORTA Ao Root diam: 2.70 cm Ao Asc diam:  3.10 cm TRICUSPID VALVE TR Peak grad:   52.4 mmHg TR Vmax:        362.00 cm/s  SHUNTS Systemic Diam: 2.10 cm Donato Schultz MD Electronically signed by Donato Schultz MD Signature Date/Time: 02/04/2021/11:56:40 AM    Final    DG ESOPHAGUS W SINGLE CM (SOL OR THIN BA)  Result Date: 02/08/2021 CLINICAL DATA:  Dysphagia EXAM: ESOPHOGRAM/BARIUM SWALLOW TECHNIQUE: Single contrast examination was performed using  thin barium. FLUOROSCOPY TIME:  Fluoroscopy Time:  2 minute Radiation Exposure Index (if provided by the fluoroscopic device): 47.8 mGy Number of Acquired Spot Images: 0 COMPARISON:  None. FINDINGS: Normal pharyngeal anatomy and motility. Contrast flowed freely through the esophagus without evidence of a mass. Relative narrowing of the distal esophagus just proximal to the esophagogastric junction concerning for a stricture. Normal esophageal mucosa without evidence of irregularity or ulceration. Tertiary contractions of the esophagus as can be seen with esophageal spasm. No evidence of reflux. No definite hiatal hernia was demonstrated. IMPRESSION: Relative narrowing of the distal esophagus just proximal to the esophagogastric junction concerning for a stricture. Tertiary contractions of the esophagus as can be seen with esophageal spasm. Electronically Signed   By: Elige Ko M.D.   On: 02/08/2021 15:19    Microbiology: Recent Results (from the past 240 hour(s))  Blood  Culture (routine x 2)     Status: None   Collection Time: 02/03/21 10:12 PM   Specimen: BLOOD RIGHT HAND  Result Value Ref Range Status   Specimen Description BLOOD RIGHT HAND  Final   Special Requests   Final    BOTTLES DRAWN AEROBIC AND ANAEROBIC Blood Culture adequate volume   Culture   Final    NO GROWTH 5 DAYS Performed at [facility], [address] [place], [place] [address]    Report Status 02/08/2021 FINAL  Final  Blood Culture (routine x 2)     Status: Abnormal   Collection Time: 02/03/21 10:24 PM   Specimen: BLOOD  Result Value Ref Range Status   Specimen Description BLOOD BLOOD RIGHT FOREARM  Final   Special Requests   Final    BOTTLES DRAWN AEROBIC AND ANAEROBIC Blood Culture results may not be optimal due to an inadequate volume of blood received in culture bottles   Culture  Setup Time   Final    GRAM POSITIVE COCCI IN CLUSTERS ANAEROBIC BOTTLE ONLY CRITICAL RESULT CALLED TO, READ BACK BY AND VERIFIED WITH: PHARMD JAMES LEDFORD 02/04/21@21 :46 BY TW    Culture (A)  Final    STAPHYLOCOCCUS HOMINIS THE SIGNIFICANCE OF ISOLATING THIS ORGANISM FROM A SINGLE SET OF BLOOD CULTURES WHEN MULTIPLE SETS ARE DRAWN IS UNCERTAIN. PLEASE  NOTIFY THE MICROBIOLOGY DEPARTMENT WITHIN ONE WEEK IF SPECIATION AND SENSITIVITIES ARE REQUIRED. Performed at [facility], [address], [place], [place] [address]    Report Status 02/06/2021 FINAL  Final  Resp Panel by RT-PCR (Flu A&B, Covid) Nasopharyngeal Swab     Status: None   Collection Time: 02/03/21 10:24 PM   Specimen: Nasopharyngeal Swab; Nasopharyngeal(NP) swabs in vial transport medium  Result Value Ref Range Status   SARS Coronavirus 2 by RT PCR NEGATIVE NEGATIVE Final    Comment: (NOTE) SARS-CoV-2 target nucleic acids are NOT DETECTED.  The SARS-CoV-2 RNA is generally detectable in upper respiratory specimens during the acute phase of infection. The lowest concentration of SARS-CoV-2 viral copies this assay can  detect is 138 copies/mL. A negative result does not preclude SARS-Cov-2 infection and should not be used as the sole basis for treatment or other patient management decisions. A negative result may occur with  improper specimen collection/handling, submission of specimen other than nasopharyngeal swab, presence of viral mutation(s) within the areas targeted by this assay, and inadequate number of viral copies(<138 copies/mL). A negative result must be combined with clinical observations, patient history, and epidemiological information. The expected result is Negative.  Fact Sheet for Patients:  BloggerCourse.com  Fact Sheet for Healthcare Providers:  SeriousBroker.it  This test is no t yet approved or cleared by the [place] FDA and  has been authorized for detection and/or diagnosis of SARS-CoV-2 by FDA under an Emergency Use Authorization (EUA). This EUA will remain  in effect (meaning this test can be used) for the duration of the COVID-19 declaration under Section 564(b)(1) of the Act, 21 U.S.C.section 360bbb-3(b)(1), unless the authorization is terminated  or revoked sooner.       Influenza A by PCR NEGATIVE NEGATIVE Final   Influenza B by PCR NEGATIVE NEGATIVE Final    Comment: (NOTE) The Xpert Xpress SARS-CoV-2/FLU/RSV plus assay is intended as an aid in the diagnosis of influenza from Nasopharyngeal swab specimens and should not be used as a sole basis for treatment. Nasal washings and aspirates are unacceptable for Xpert Xpress SARS-CoV-2/FLU/RSV testing.  Fact Sheet for Patients: BloggerCourse.com  Fact Sheet for Healthcare Providers: SeriousBroker.it  This test is not yet approved or cleared by the [place] FDA and has been authorized for detection and/or diagnosis of SARS-CoV-2 by FDA under an Emergency Use Authorization (EUA). This EUA will remain in  effect (meaning this test can be used) for the duration of the COVID-19 declaration under Section 564(b)(1) of the Act, 21 U.S.C. section 360bbb-3(b)(1), unless the authorization is terminated or revoked.  Performed at [facility], [address], [place], [place] [address]   Blood Culture ID Panel (Reflexed)     Status: Abnormal   Collection Time: 02/03/21 10:24 PM  Result Value Ref Range Status   Enterococcus faecalis NOT DETECTED NOT DETECTED Final   Enterococcus Faecium NOT DETECTED NOT DETECTED Final   Listeria monocytogenes NOT DETECTED NOT DETECTED Final   Staphylococcus species DETECTED (A) NOT DETECTED Final    Comment: CRITICAL RESULT CALLED TO, READ BACK BY AND VERIFIED WITH: PHARMD JAMES LEDFORD 02/04/21@21 :46 BY TW    Staphylococcus aureus (BCID) NOT DETECTED NOT DETECTED Final   Staphylococcus epidermidis NOT DETECTED NOT DETECTED Final   Staphylococcus lugdunensis NOT DETECTED NOT DETECTED Final   Streptococcus species NOT DETECTED NOT DETECTED Final   Streptococcus agalactiae NOT DETECTED NOT DETECTED Final   Streptococcus pneumoniae NOT DETECTED NOT DETECTED Final  Streptococcus pyogenes NOT DETECTED NOT DETECTED Final   A.calcoaceticus-baumannii NOT DETECTED NOT DETECTED Final   Bacteroides fragilis NOT DETECTED NOT DETECTED Final   Enterobacterales NOT DETECTED NOT DETECTED Final   Enterobacter cloacae complex NOT DETECTED NOT DETECTED Final   Escherichia coli NOT DETECTED NOT DETECTED Final   Klebsiella aerogenes NOT DETECTED NOT DETECTED Final   Klebsiella oxytoca NOT DETECTED NOT DETECTED Final   Klebsiella pneumoniae NOT DETECTED NOT DETECTED Final   Proteus species NOT DETECTED NOT DETECTED Final   Salmonella species NOT DETECTED NOT DETECTED Final   Serratia marcescens NOT DETECTED NOT DETECTED Final   Haemophilus influenzae NOT DETECTED NOT DETECTED Final   Neisseria meningitidis NOT DETECTED NOT DETECTED Final   Pseudomonas aeruginosa NOT  DETECTED NOT DETECTED Final   Stenotrophomonas maltophilia NOT DETECTED NOT DETECTED Final   Candida albicans NOT DETECTED NOT DETECTED Final   Candida auris NOT DETECTED NOT DETECTED Final   Candida glabrata NOT DETECTED NOT DETECTED Final   Candida krusei NOT DETECTED NOT DETECTED Final   Candida parapsilosis NOT DETECTED NOT DETECTED Final   Candida tropicalis NOT DETECTED NOT DETECTED Final   Cryptococcus neoformans/gattii NOT DETECTED NOT DETECTED Final    Comment: Performed at [facility], [address], [place], [place] [address]  Urine Culture     Status: Abnormal   Collection Time: 02/04/21  6:30 AM   Specimen: In/Out Cath Urine  Result Value Ref Range Status   Specimen Description IN/OUT CATH URINE  Final   Special Requests   Final    NONE Performed at [facility], [address], [place], [place] [address]    Culture (A)  Final    40,000 COLONIES/mL METHICILLIN RESISTANT STAPHYLOCOCCUS AUREUS   Report Status 02/06/2021 FINAL  Final   Organism ID, Bacteria METHICILLIN RESISTANT STAPHYLOCOCCUS AUREUS (A)  Final      Susceptibility   Methicillin resistant staphylococcus aureus - MIC*    CIPROFLOXACIN >=8 RESISTANT Resistant     GENTAMICIN <=0.5 SENSITIVE Sensitive     NITROFURANTOIN <=16 SENSITIVE Sensitive     OXACILLIN >=4 RESISTANT Resistant     TETRACYCLINE <=1 SENSITIVE Sensitive     VANCOMYCIN 1 SENSITIVE Sensitive     TRIMETH/SULFA >=320 RESISTANT Resistant     CLINDAMYCIN <=0.25 SENSITIVE Sensitive     RIFAMPIN <=0.5 SENSITIVE Sensitive     Inducible Clindamycin NEGATIVE Sensitive     * 40,000 COLONIES/mL METHICILLIN RESISTANT STAPHYLOCOCCUS AUREUS  Resp Panel by RT-PCR (Flu A&B, Covid) Nasopharyngeal Swab     Status: None   Collection Time: 02/11/21 12:26 PM   Specimen: Nasopharyngeal Swab; Nasopharyngeal(NP) swabs in vial transport medium  Result Value Ref Range Status   SARS Coronavirus 2 by RT PCR NEGATIVE NEGATIVE Final    Comment:  (NOTE) SARS-CoV-2 target nucleic acids are NOT DETECTED.  The SARS-CoV-2 RNA is generally detectable in upper respiratory specimens during the acute phase of infection. The lowest concentration of SARS-CoV-2 viral copies this assay can detect is 138 copies/mL. A negative result does not preclude SARS-Cov-2 infection and should not be used as the sole basis for treatment or other patient management decisions. A negative result may occur with  improper specimen collection/handling, submission of specimen other than nasopharyngeal swab, presence of viral mutation(s) within the areas targeted by this assay, and inadequate number of viral copies(<138 copies/mL). A negative result must be combined with clinical observations, patient history, and epidemiological information. The expected result is Negative.  Fact Sheet  for Patients:  BloggerCourse.com  Fact Sheet for Healthcare Providers:  SeriousBroker.it  This test is no t yet approved or cleared by the [place] FDA and  has been authorized for detection and/or diagnosis of SARS-CoV-2 by FDA under an Emergency Use Authorization (EUA). This EUA will remain  in effect (meaning this test can be used) for the duration of the COVID-19 declaration under Section 564(b)(1) of the Act, 21 U.S.C.section 360bbb-3(b)(1), unless the authorization is terminated  or revoked sooner.       Influenza A by PCR NEGATIVE NEGATIVE Final   Influenza B by PCR NEGATIVE NEGATIVE Final    Comment: (NOTE) The Xpert Xpress SARS-CoV-2/FLU/RSV plus assay is intended as an aid in the diagnosis of influenza from Nasopharyngeal swab specimens and should not be used as a sole basis for treatment. Nasal washings and aspirates are unacceptable for Xpert Xpress SARS-CoV-2/FLU/RSV testing.  Fact Sheet for Patients: BloggerCourse.com  Fact Sheet for Healthcare  Providers: SeriousBroker.it  This test is not yet approved or cleared by the [place] FDA and has been authorized for detection and/or diagnosis of SARS-CoV-2 by FDA under an Emergency Use Authorization (EUA). This EUA will remain in effect (meaning this test can be used) for the duration of the COVID-19 declaration under Section 564(b)(1) of the Act, 21 U.S.C. section 360bbb-3(b)(1), unless the authorization is terminated or revoked.  Performed at [facility], [address], [place], [place] [address]      Labs: Basic Metabolic Panel: Recent Labs  Lab 02/07/21 0148 02/08/21 0151 02/09/21 0249 02/10/21 0433 02/11/21 0448  NA 152* 141 138 138 139  K 4.7 5.0 4.4 4.4 4.5  CL 112* 100 96* 95* 97*  CO2 31 33* 38* 33* 32  GLUCOSE 280* 198* 227* 206* 214*  BUN 35* 28* 29* 21 14  CREATININE 0.87 0.75 0.81 0.79 0.75  CALCIUM 9.8 9.8 9.7 9.6 9.4   Liver Function Tests: No results for input(s): AST, ALT, ALKPHOS, BILITOT, PROT, ALBUMIN in the last 168 hours. No results for input(s): LIPASE, AMYLASE in the last 168 hours. No results for input(s): AMMONIA in the last 168 hours. CBC: Recent Labs  Lab 02/06/21 0204 02/07/21 0548 02/08/21 0151 02/09/21 0249 02/10/21 0433  WBC 12.4* 17.4* 15.5* 16.8* 12.6*  HGB 14.2 14.4 14.4 14.1 14.0  HCT 49.9* 50.1* 50.6* 47.8* 48.1*  MCV 88.6 85.8 87.5 85.7 85.7  PLT 230 212 207 248 131*   Cardiac Enzymes: No results for input(s): CKTOTAL, CKMB, CKMBINDEX, TROPONINI in the last 168 hours. BNP: BNP (last 3 results) Recent Labs    02/03/21 2122 02/05/21 0207  BNP 725.1* 417.3*    ProBNP (last 3 results) No results for input(s): PROBNP in the last 8760 hours.  CBG: Recent Labs  Lab 02/11/21 1630 02/11/21 2053 02/12/21 0747 02/12/21 1113 02/12/21 1607  GLUCAP 326* 203* 169* 211* 160*       Signed:  Meredeth Ide MD.  [facility] 02/12/2021, 4:47 PM

## 2021-02-13 LAB — GLUCOSE, CAPILLARY: Glucose-Capillary: 193 mg/dL — ABNORMAL HIGH (ref 70–99)

## 2021-02-13 NOTE — Plan of Care (Signed)
Pt has been discharged to [facility] Report called to [phone] spoke with Ena RN. AVS packet given to [employer] [facility]. Husband notified of transport via phone call.    Problem: Education: Goal: Knowledge of General Education information will improve Description: Including pain rating scale, medication(s)/side effects and non-pharmacologic comfort measures Outcome: Adequate for Discharge   Problem: Health Behavior/Discharge Planning: Goal: Ability to manage health-related needs will improve Outcome: Adequate for Discharge   Problem: Clinical Measurements: Goal: Ability to maintain clinical measurements within normal limits will improve Outcome: Adequate for Discharge Goal: Will remain free from infection Outcome: Adequate for Discharge Goal: Diagnostic test results will improve Outcome: Adequate for Discharge Goal: Respiratory complications will improve Outcome: Adequate for Discharge Goal: Cardiovascular complication will be avoided Outcome: Adequate for Discharge   Problem: Activity: Goal: Risk for activity intolerance will decrease Outcome: Adequate for Discharge   Problem: Nutrition: Goal: Adequate nutrition will be maintained Outcome: Adequate for Discharge   Problem: Coping: Goal: Level of anxiety will decrease Outcome: Adequate for Discharge   Problem: Elimination: Goal: Will not experience complications related to bowel motility Outcome: Adequate for Discharge Goal: Will not experience complications related to urinary retention Outcome: Adequate for Discharge   Problem: Pain Managment: Goal: General experience of comfort will improve Outcome: Adequate for Discharge   Problem: Safety: Goal: Ability to remain free from injury will improve Outcome: Adequate for Discharge   Problem: Skin Integrity: Goal: Risk for impaired skin integrity will decrease Outcome: Adequate for Discharge

## 2021-02-15 ENCOUNTER — Institutional Professional Consult (permissible substitution): Payer: Medicare (Managed Care) | Admitting: Pulmonary Disease

## 2021-03-03 ENCOUNTER — Other Ambulatory Visit: Payer: Self-pay

## 2021-03-03 ENCOUNTER — Ambulatory Visit (INDEPENDENT_AMBULATORY_CARE_PROVIDER_SITE_OTHER): Payer: Medicare (Managed Care)

## 2021-03-03 ENCOUNTER — Encounter: Payer: Self-pay | Admitting: Pulmonary Disease

## 2021-03-03 ENCOUNTER — Ambulatory Visit (INDEPENDENT_AMBULATORY_CARE_PROVIDER_SITE_OTHER): Payer: Medicare (Managed Care) | Admitting: Pulmonary Disease

## 2021-03-03 VITALS — BP 118/70 | HR 89 | Temp 98.2°F

## 2021-03-03 DIAGNOSIS — J449 Chronic obstructive pulmonary disease, unspecified: Secondary | ICD-10-CM | POA: Diagnosis not present

## 2021-03-03 DIAGNOSIS — R400 Somnolence: Secondary | ICD-10-CM | POA: Diagnosis not present

## 2021-03-03 NOTE — Progress Notes (Signed)
Courtney [name]    [id number]    May 25, 1945  Primary Care Physician:Gordon, Gaye Alken, PA-C  Referring Physician: Sherron Monday, MD [address] [place],  [place] [address]  Chief complaint:   Patient seen for COPD, concern for sleep apnea  HPI:  History is difficult to obtain from patient  She denies any sleep concerns Admits to snoring, sleepy during the day  Debilitated Unable to walk for the last year  Goes to bed late, could not tell me how long it takes her to fall asleep She gets woken up early for medications, could not tell me what time she finally gets out of bed  She is tired during the day Occasionally dry mouth in the mornings No night sweats  Has been on oxygen for about 3 years Quit smoking about a pack a day about a year ago  Record review reveals a history of COPD, hyperlipidemia, sleep apnea   Outpatient Encounter Medications as of 03/03/2021  Medication Sig   insulin aspart (NOVOLOG) 100 UNIT/ML injection Inject 0-16 Units into the skin See admin instructions. Inject before meals and at bedtime, per sliding scale: 0-150 = 0U 151-200 = 4U 201-250 = 6U 251-300 = 8U 301-350 = 10U 351-400 = 14U 401-450U = 16U   acetaminophen (TYLENOL) 650 MG CR tablet Take 650 mg by mouth every 6 (six) hours as needed for pain.   amLODipine (NORVASC) 10 MG tablet Take 1 tablet (10 mg total) by mouth daily.   arformoterol (BROVANA) 15 MCG/2ML NEBU Take 2 mLs (15 mcg total) by nebulization 2 (two) times daily.   aspirin EC 81 MG EC tablet Take 1 tablet (81 mg total) by mouth daily.   atorvastatin (LIPITOR) 80 MG tablet Take 80 mg by mouth at bedtime.   budesonide (PULMICORT) 0.25 MG/2ML nebulizer solution Take 2 mLs (0.25 mg total) by nebulization 2 (two) times daily.   D-5000 125 MCG (5000 UT) TABS Take 1 tablet by mouth daily.   ferrous sulfate 325 (65 FE) MG tablet Take 1 tablet (325 mg total) by mouth 2 (two) times daily with a meal. (Patient taking  differently: Take 325 mg by mouth daily with breakfast.)   fluticasone (FLONASE) 50 MCG/ACT nasal spray Place 1 spray into both nostrils daily.   furosemide (LASIX) 40 MG tablet Take 1 tablet (40 mg total) by mouth daily.   ipratropium-albuterol (DUONEB) 0.5-2.5 (3) MG/3ML SOLN Take 3 mLs by nebulization every 6 (six) hours as needed. (Patient not taking: No sig reported)   LANTUS SOLOSTAR 100 UNIT/ML Solostar Pen Inject 21 Units into the skin 2 (two) times daily.   melatonin 3 MG TABS tablet Take 1 tablet (3 mg total) by mouth at bedtime.   memantine (NAMENDA) 5 MG tablet Take 5 mg by mouth at bedtime.   metFORMIN (GLUCOPHAGE) 1000 MG tablet Take 1,000 mg by mouth 2 (two) times daily with a meal.   metoprolol succinate (TOPROL-XL) 25 MG 24 hr tablet Take 25 mg by mouth daily.   pantoprazole (PROTONIX) 40 MG tablet Take 1 tablet (40 mg total) by mouth 2 (two) times daily.   polyethylene glycol (MIRALAX / GLYCOLAX) 17 g packet Take 17 g by mouth daily.   saccharomyces boulardii (FLORASTOR) 250 MG capsule Take 250 mg by mouth 2 (two) times daily.   senna (SENOKOT) 8.6 MG TABS tablet Take 2 tablets by mouth daily.   sertraline (ZOLOFT) 50 MG tablet Take 50 mg by  mouth daily.    tiotropium (SPIRIVA) 18 MCG inhalation capsule Place 18 mcg into inhaler and inhale daily.   No facility-administered encounter medications on file as of 03/03/2021.    Allergies as of 03/03/2021 - Review Complete 03/03/2021  Allergen Reaction Noted   Penicillins Other (See Comments) 04/05/2011    Past Medical History:  Diagnosis Date   Anemia    Arthritis    Asthma    CAD (coronary artery disease)    COPD (chronic obstructive pulmonary disease) (HCC)    Diabetes mellitus    Hyperlipidemia    Hypertension    Myocardial infarct (HCC)    Postmenopausal atrophic vaginitis    Restless leg syndrome     Past Surgical History:  Procedure Laterality Date   BIOPSY  02/09/2021   Procedure: BIOPSY;  Surgeon: Imogene Burn, MD;  Location: [facility] ENDOSCOPY;  Service: Gastroenterology;;   CHOLECYSTECTOMY  1970's   ESOPHAGOGASTRODUODENOSCOPY (EGD) WITH PROPOFOL N/A 02/09/2021   Procedure: ESOPHAGOGASTRODUODENOSCOPY (EGD) WITH PROPOFOL;  Surgeon: Imogene Burn, MD;  Location: [facility] ENDOSCOPY;  Service: Gastroenterology;  Laterality: N/A;   INCISION AND DRAINAGE ABSCESS Right 06/27/2018   Procedure: 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.;  Surgeon: Romie Levee, MD;  Location: [facility] ORS;  Service: General;  Laterality: Right;   INCISION AND DRAINAGE PERIRECTAL ABSCESS N/A 06/25/2018   Procedure: IRRIGATION AND DEBRIDEMENT PERIRECTAL ABCESS;  Surgeon: Romie Levee, MD;  Location: [facility] ORS;  Service: General;  Laterality: N/A;   IRRIGATION AND DEBRIDEMENT ABSCESS N/A 07/01/2018   Procedure: IRRIGATION AND DEBRIDEMENT ABSCESS;  Surgeon: Emelia Loron, MD;  Location: [facility] ORS;  Service: General;  Laterality: N/A;   OTHER SURGICAL HISTORY  1970's   hysterectomy   WOUND DEBRIDEMENT N/A 07/03/2018   Procedure: DEBRIDEMENT PELVIS WITH DRESSING CHANGE;  Surgeon: Emelia Loron, MD;  Location: [facility] ORS;  Service: General;  Laterality: N/A;    Family History  Problem Relation Age of Onset   Hypertension Brother    Heart attack Neg Hx     Social History   Socioeconomic History   Marital status: Married    Spouse name: Not on file   Number of children: Not on file   Years of education: Not on file   Highest education level: Not on file  Occupational History   Not on file  Tobacco Use   Smoking status: Every Day    Packs/day: 0.50    Types: Cigarettes   Smokeless tobacco: Never  Substance and Sexual Activity   Alcohol use: No   Drug use: No   Sexual activity: Not on file  Other Topics Concern   Not on file  Social History Narrative   Not on file   Social Determinants of Health   Financial Resource Strain: Not on file  Food Insecurity: Not on file  Transportation Needs: Not on file   Physical Activity: Not on file  Stress: Not on file  Social Connections: Not on file  Intimate Partner Violence: Not on file    Review of Systems  Constitutional:  Positive for fatigue.  Respiratory:  Positive for cough and shortness of breath.   Psychiatric/Behavioral:  Positive for sleep disturbance.    Vitals:   03/03/21 1351  BP: 118/70  Pulse: 89  Temp: 98.2 F (36.8 C)  SpO2: 97%     Physical Exam Constitutional:      Appearance: She is obese.  HENT:     Head: Normocephalic.     Nose: Nose normal.  Mouth/Throat:     Mouth: Mucous membranes are moist.     Comments: Crowded oropharynx Cardiovascular:     Rate and Rhythm: Normal rate.     Heart sounds: No murmur heard.   No friction rub.  Pulmonary:     Effort: No respiratory distress.     Breath sounds: No stridor. No wheezing or rhonchi.  Musculoskeletal:     Cervical back: No rigidity or tenderness.  Neurological:     Mental Status: She is alert.  Psychiatric:        Mood and Affect: Mood normal.   No flowsheet data found.   Epworth level report is difficult to evaluate with the patient  Data Reviewed: Chest x-ray ordered  Assessment:  History of chronic obstructive pulmonary disease -Encouraged to continue bronchodilator treatments -Continue Spiriva -Continue Brovana -Albuterol as needed  Possibility of overlap syndrome -Will require an in lab sleep study  Plan/Recommendations: Follow-up following sleep study  Continue oxygen supplementation  Continue bronchodilators  Tentative follow-up in 6 weeks   Virl Diamond MD [facility] 03/03/2021, 2:06 PM  CC: Sherron Monday, MD

## 2021-03-03 NOTE — Patient Instructions (Signed)
Obtain chest x-ray today  Continue bronchodilators including Spiriva, Brovana -Nebulization treatment with albuterol as needed   Possibility of obstructive sleep apnea/overlap syndrome  Continue oxygen supplementation  -Schedule patient for an in lab sleep study -Will order split-night study  We will follow-up in about 6 weeks  Call with significant concerns

## 2021-03-07 ENCOUNTER — Encounter (HOSPITAL_COMMUNITY): Payer: Self-pay | Admitting: Family Medicine

## 2021-03-07 ENCOUNTER — Emergency Department (HOSPITAL_COMMUNITY): Payer: Medicare (Managed Care)

## 2021-03-07 ENCOUNTER — Other Ambulatory Visit: Payer: Self-pay

## 2021-03-07 ENCOUNTER — Inpatient Hospital Stay (HOSPITAL_COMMUNITY)
Admission: EM | Admit: 2021-03-07 | Discharge: 2021-03-09 | DRG: 291 | Disposition: A | Discharge status: Skilled Nursing Facility | Payer: Medicare (Managed Care) | Attending: Family Medicine | Admitting: Family Medicine

## 2021-03-07 ENCOUNTER — Observation Stay (HOSPITAL_COMMUNITY): Payer: Medicare (Managed Care)

## 2021-03-07 DIAGNOSIS — E119 Type 2 diabetes mellitus without complications: Secondary | ICD-10-CM | POA: Diagnosis present

## 2021-03-07 DIAGNOSIS — Z794 Long term (current) use of insulin: Secondary | ICD-10-CM

## 2021-03-07 DIAGNOSIS — Z7984 Long term (current) use of oral hypoglycemic drugs: Secondary | ICD-10-CM

## 2021-03-07 DIAGNOSIS — Z6841 Body Mass Index (BMI) 40.0 and over, adult: Secondary | ICD-10-CM

## 2021-03-07 DIAGNOSIS — I252 Old myocardial infarction: Secondary | ICD-10-CM

## 2021-03-07 DIAGNOSIS — Z20822 Contact with and (suspected) exposure to covid-19: Secondary | ICD-10-CM | POA: Diagnosis present

## 2021-03-07 DIAGNOSIS — R079 Chest pain, unspecified: Secondary | ICD-10-CM | POA: Diagnosis present

## 2021-03-07 DIAGNOSIS — Z8249 Family history of ischemic heart disease and other diseases of the circulatory system: Secondary | ICD-10-CM

## 2021-03-07 DIAGNOSIS — I509 Heart failure, unspecified: Secondary | ICD-10-CM

## 2021-03-07 DIAGNOSIS — Z66 Do not resuscitate: Secondary | ICD-10-CM | POA: Diagnosis present

## 2021-03-07 DIAGNOSIS — I10 Essential (primary) hypertension: Secondary | ICD-10-CM | POA: Diagnosis present

## 2021-03-07 DIAGNOSIS — I110 Hypertensive heart disease with heart failure: Principal | ICD-10-CM | POA: Diagnosis present

## 2021-03-07 DIAGNOSIS — Z91012 Allergy to eggs: Secondary | ICD-10-CM

## 2021-03-07 DIAGNOSIS — I5033 Acute on chronic diastolic (congestive) heart failure: Secondary | ICD-10-CM | POA: Diagnosis present

## 2021-03-07 DIAGNOSIS — E785 Hyperlipidemia, unspecified: Secondary | ICD-10-CM | POA: Diagnosis present

## 2021-03-07 DIAGNOSIS — Z880 Allergy status to penicillin: Secondary | ICD-10-CM

## 2021-03-07 DIAGNOSIS — I2510 Atherosclerotic heart disease of native coronary artery without angina pectoris: Secondary | ICD-10-CM | POA: Diagnosis present

## 2021-03-07 DIAGNOSIS — F17210 Nicotine dependence, cigarettes, uncomplicated: Secondary | ICD-10-CM | POA: Diagnosis present

## 2021-03-07 DIAGNOSIS — G2581 Restless legs syndrome: Secondary | ICD-10-CM | POA: Diagnosis present

## 2021-03-07 DIAGNOSIS — I5043 Acute on chronic combined systolic (congestive) and diastolic (congestive) heart failure: Secondary | ICD-10-CM | POA: Diagnosis present

## 2021-03-07 DIAGNOSIS — J449 Chronic obstructive pulmonary disease, unspecified: Secondary | ICD-10-CM | POA: Diagnosis present

## 2021-03-07 DIAGNOSIS — Z7951 Long term (current) use of inhaled steroids: Secondary | ICD-10-CM

## 2021-03-07 DIAGNOSIS — Z7982 Long term (current) use of aspirin: Secondary | ICD-10-CM

## 2021-03-07 LAB — BRAIN NATRIURETIC PEPTIDE: B Natriuretic Peptide: 303.9 pg/mL — ABNORMAL HIGH (ref 0.0–100.0)

## 2021-03-07 LAB — CBC WITH DIFFERENTIAL/PLATELET
Abs Immature Granulocytes: 0.05 10*3/uL (ref 0.00–0.07)
Basophils Absolute: 0 10*3/uL (ref 0.0–0.1)
Basophils Relative: 0 %
Eosinophils Absolute: 0.2 10*3/uL (ref 0.0–0.5)
Eosinophils Relative: 2 %
HCT: 41.4 % (ref 36.0–46.0)
Hemoglobin: 11.7 g/dL — ABNORMAL LOW (ref 12.0–15.0)
Immature Granulocytes: 1 %
Lymphocytes Relative: 21 %
Lymphs Abs: 2 10*3/uL (ref 0.7–4.0)
MCH: 25.7 pg — ABNORMAL LOW (ref 26.0–34.0)
MCHC: 28.3 g/dL — ABNORMAL LOW (ref 30.0–36.0)
MCV: 90.8 fL (ref 80.0–100.0)
Monocytes Absolute: 0.7 10*3/uL (ref 0.1–1.0)
Monocytes Relative: 7 %
Neutro Abs: 6.7 10*3/uL (ref 1.7–7.7)
Neutrophils Relative %: 69 %
Platelets: 178 10*3/uL (ref 150–400)
RBC: 4.56 MIL/uL (ref 3.87–5.11)
RDW: 15.9 % — ABNORMAL HIGH (ref 11.5–15.5)
WBC: 9.6 10*3/uL (ref 4.0–10.5)
nRBC: 0.2 % (ref 0.0–0.2)

## 2021-03-07 LAB — COMPREHENSIVE METABOLIC PANEL
ALT: 35 U/L (ref 0–44)
AST: 30 U/L (ref 15–41)
Albumin: 2.9 g/dL — ABNORMAL LOW (ref 3.5–5.0)
Alkaline Phosphatase: 112 U/L (ref 38–126)
Anion gap: 8 (ref 5–15)
BUN: 11 mg/dL (ref 8–23)
CO2: 34 mmol/L — ABNORMAL HIGH (ref 22–32)
Calcium: 9.3 mg/dL (ref 8.9–10.3)
Chloride: 98 mmol/L (ref 98–111)
Creatinine, Ser: 0.81 mg/dL (ref 0.44–1.00)
GFR, Estimated: >60 mL/min (ref >60)
Glucose, Bld: 197 mg/dL — ABNORMAL HIGH (ref 70–99)
Potassium: 3.9 mmol/L (ref 3.5–5.1)
Sodium: 140 mmol/L (ref 135–145)
Total Bilirubin: 0.5 mg/dL (ref 0.3–1.2)
Total Protein: 6.2 g/dL — ABNORMAL LOW (ref 6.5–8.1)

## 2021-03-07 LAB — CBG MONITORING, ED
Glucose-Capillary: 112 mg/dL — ABNORMAL HIGH (ref 70–99)
Glucose-Capillary: 102 mg/dL — ABNORMAL HIGH (ref 70–99)
Glucose-Capillary: 106 mg/dL — ABNORMAL HIGH (ref 70–99)

## 2021-03-07 LAB — RESP PANEL BY RT-PCR (FLU A&B, COVID) ARPGX2
Influenza A by PCR: NEGATIVE
Influenza B by PCR: NEGATIVE
SARS Coronavirus 2 by RT PCR: NEGATIVE

## 2021-03-07 LAB — TROPONIN I (HIGH SENSITIVITY)
Troponin I (High Sensitivity): 16 ng/L (ref <18)
Troponin I (High Sensitivity): 15 ng/L (ref <18)
Troponin I (High Sensitivity): 12 ng/L (ref <18)
Troponin I (High Sensitivity): 12 ng/L (ref <18)

## 2021-03-07 LAB — GLUCOSE, CAPILLARY: Glucose-Capillary: 154 mg/dL — ABNORMAL HIGH (ref 70–99)

## 2021-03-07 LAB — NM MYOCAR MULTI W/SPECT W/WALL MOTION / EF
Estimated workload: 1
Exercise duration (min): 6 min
Exercise duration (sec): 4 s
Nuc Stress EF: 57 %
Peak HR: 90 {beats}/min
Rest HR: 73 {beats}/min
Rest Nuclear Isotope Dose: 11 mCi
ST Depression (mm): 0 mm
Stress Nuclear Isotope Dose: 33 mCi
TID: 1.6

## 2021-03-07 LAB — LIPASE, BLOOD: Lipase: 29 U/L (ref 11–51)

## 2021-03-07 LAB — POC OCCULT BLOOD, ED: Fecal Occult Bld: NEGATIVE

## 2021-03-07 MED ORDER — ONDANSETRON HCL 4 MG PO TABS: 4.0000 mg | ORAL_TABLET | Freq: Four times a day (QID) | ORAL | Status: DC | PRN

## 2021-03-07 MED ORDER — ONDANSETRON HCL 4 MG/2ML IJ SOLN: 4.0000 mg | Freq: Four times a day (QID) | INTRAMUSCULAR | Status: DC | PRN

## 2021-03-07 MED ORDER — IPRATROPIUM-ALBUTEROL 0.5-2.5 (3) MG/3ML IN SOLN
3.0000 mL | Freq: Once | RESPIRATORY_TRACT | Status: AC
  Administered 2021-03-07: 3 mL via RESPIRATORY_TRACT
  Filled 2021-03-07: qty 3

## 2021-03-07 MED ORDER — TECHNETIUM TC 99M TETROFOSMIN IV KIT
33.0000 | PACK | Freq: Once | INTRAVENOUS | Status: AC | PRN
  Administered 2021-03-07: 33 via INTRAVENOUS

## 2021-03-07 MED ORDER — BUDESONIDE 0.25 MG/2ML IN SUSP
0.2500 mg | Freq: Two times a day (BID) | RESPIRATORY_TRACT | Status: DC
  Administered 2021-03-07 – 2021-03-09 (×4): 0.25 mg via RESPIRATORY_TRACT
  Filled 2021-03-07 (×6): qty 2

## 2021-03-07 MED ORDER — IPRATROPIUM-ALBUTEROL 0.5-2.5 (3) MG/3ML IN SOLN
3.0000 mL | Freq: Four times a day (QID) | RESPIRATORY_TRACT | Status: DC | PRN
  Administered 2021-03-09: 3 mL via RESPIRATORY_TRACT
  Filled 2021-03-07: qty 3

## 2021-03-07 MED ORDER — REGADENOSON 0.4 MG/5ML IV SOLN
INTRAVENOUS | Status: AC
  Administered 2021-03-07: 0.4 mg via INTRAVENOUS
  Filled 2021-03-07: qty 5

## 2021-03-07 MED ORDER — SENNA 8.6 MG PO TABS
2.0000 | ORAL_TABLET | Freq: Every day | ORAL | Status: DC
  Administered 2021-03-07 – 2021-03-09 (×3): 17.2 mg via ORAL
  Filled 2021-03-07 (×3): qty 2

## 2021-03-07 MED ORDER — ACETAMINOPHEN 325 MG PO TABS
650.0000 mg | ORAL_TABLET | Freq: Four times a day (QID) | ORAL | Status: DC | PRN
  Administered 2021-03-08 – 2021-03-09 (×3): 650 mg via ORAL
  Filled 2021-03-07 (×3): qty 2

## 2021-03-07 MED ORDER — METFORMIN HCL 500 MG PO TABS
1000.0000 mg | ORAL_TABLET | Freq: Two times a day (BID) | ORAL | Status: DC
  Administered 2021-03-07 – 2021-03-09 (×4): 1000 mg via ORAL
  Filled 2021-03-07 (×5): qty 2

## 2021-03-07 MED ORDER — ENOXAPARIN SODIUM 40 MG/0.4ML IJ SOSY
40.0000 mg | PREFILLED_SYRINGE | INTRAMUSCULAR | Status: DC
  Administered 2021-03-07 – 2021-03-09 (×3): 40 mg via SUBCUTANEOUS
  Filled 2021-03-07 (×3): qty 0.4

## 2021-03-07 MED ORDER — SERTRALINE HCL 50 MG PO TABS
50.0000 mg | ORAL_TABLET | Freq: Every day | ORAL | Status: DC
  Administered 2021-03-07 – 2021-03-09 (×3): 50 mg via ORAL
  Filled 2021-03-07 (×3): qty 1

## 2021-03-07 MED ORDER — TECHNETIUM TC 99M TETROFOSMIN IV KIT
11.0000 | PACK | Freq: Once | INTRAVENOUS | Status: AC | PRN
  Administered 2021-03-07: 11 via INTRAVENOUS

## 2021-03-07 MED ORDER — SACCHAROMYCES BOULARDII 250 MG PO CAPS
250.0000 mg | ORAL_CAPSULE | Freq: Two times a day (BID) | ORAL | Status: DC
  Administered 2021-03-07 – 2021-03-09 (×5): 250 mg via ORAL
  Filled 2021-03-07 (×6): qty 1

## 2021-03-07 MED ORDER — FUROSEMIDE 10 MG/ML IJ SOLN
40.0000 mg | Freq: Once | INTRAMUSCULAR | Status: AC
  Administered 2021-03-07: 40 mg via INTRAVENOUS
  Filled 2021-03-07: qty 4

## 2021-03-07 MED ORDER — SODIUM CHLORIDE 0.9% FLUSH: 3.0000 mL | INTRAVENOUS | Status: DC | PRN

## 2021-03-07 MED ORDER — POLYVINYL ALCOHOL 1.4 % OP SOLN
1.0000 [drp] | OPHTHALMIC | Status: DC | PRN
  Filled 2021-03-07: qty 15

## 2021-03-07 MED ORDER — ASPIRIN EC 81 MG PO TBEC
81.0000 mg | DELAYED_RELEASE_TABLET | Freq: Every day | ORAL | Status: DC
  Administered 2021-03-07 – 2021-03-09 (×3): 81 mg via ORAL
  Filled 2021-03-07 (×3): qty 1

## 2021-03-07 MED ORDER — SODIUM CHLORIDE 0.9% FLUSH
3.0000 mL | Freq: Two times a day (BID) | INTRAVENOUS | Status: DC
  Administered 2021-03-07 – 2021-03-09 (×5): 3 mL via INTRAVENOUS

## 2021-03-07 MED ORDER — AMLODIPINE BESYLATE 10 MG PO TABS
10.0000 mg | ORAL_TABLET | Freq: Every day | ORAL | Status: DC
  Administered 2021-03-07 – 2021-03-09 (×3): 10 mg via ORAL
  Filled 2021-03-07: qty 2
  Filled 2021-03-07 (×2): qty 1

## 2021-03-07 MED ORDER — SODIUM CHLORIDE 0.9 % IV SOLN: 250.0000 mL | INTRAVENOUS | Status: DC | PRN

## 2021-03-07 MED ORDER — METOPROLOL SUCCINATE ER 25 MG PO TB24
25.0000 mg | ORAL_TABLET | Freq: Every day | ORAL | Status: DC
  Administered 2021-03-07 – 2021-03-09 (×3): 25 mg via ORAL
  Filled 2021-03-07 (×3): qty 1

## 2021-03-07 MED ORDER — INSULIN GLARGINE 100 UNIT/ML ~~LOC~~ SOLN
21.0000 [IU] | Freq: Two times a day (BID) | SUBCUTANEOUS | Status: DC
  Administered 2021-03-07 – 2021-03-08 (×2): 21 [IU] via SUBCUTANEOUS
  Filled 2021-03-07 (×4): qty 0.21

## 2021-03-07 MED ORDER — ARFORMOTEROL TARTRATE 15 MCG/2ML IN NEBU
15.0000 ug | INHALATION_SOLUTION | Freq: Two times a day (BID) | RESPIRATORY_TRACT | Status: DC
  Administered 2021-03-07 – 2021-03-09 (×4): 15 ug via RESPIRATORY_TRACT
  Filled 2021-03-07 (×6): qty 2

## 2021-03-07 MED ORDER — REGADENOSON 0.4 MG/5ML IV SOLN
0.4000 mg | Freq: Once | INTRAVENOUS | Status: AC
  Filled 2021-03-07: qty 5

## 2021-03-07 MED ORDER — ATORVASTATIN CALCIUM 80 MG PO TABS
80.0000 mg | ORAL_TABLET | Freq: Every day | ORAL | Status: DC
  Administered 2021-03-07 – 2021-03-08 (×2): 80 mg via ORAL
  Filled 2021-03-07 (×2): qty 1

## 2021-03-07 MED ORDER — INSULIN ASPART 100 UNIT/ML IJ SOLN
0.0000 [IU] | Freq: Three times a day (TID) | INTRAMUSCULAR | Status: DC
  Administered 2021-03-08: 5 [IU] via SUBCUTANEOUS
  Administered 2021-03-09: 15 [IU] via SUBCUTANEOUS
  Administered 2021-03-09: 8 [IU] via SUBCUTANEOUS
  Administered 2021-03-09: 2 [IU] via SUBCUTANEOUS

## 2021-03-07 MED ORDER — ACETAMINOPHEN 650 MG RE SUPP: 650.0000 mg | Freq: Four times a day (QID) | RECTAL | Status: DC | PRN

## 2021-03-07 MED ORDER — PANTOPRAZOLE SODIUM 40 MG PO TBEC
40.0000 mg | DELAYED_RELEASE_TABLET | Freq: Two times a day (BID) | ORAL | Status: DC
  Administered 2021-03-07 – 2021-03-09 (×5): 40 mg via ORAL
  Filled 2021-03-07 (×5): qty 1

## 2021-03-07 MED ORDER — FERROUS SULFATE 325 (65 FE) MG PO TABS
325.0000 mg | ORAL_TABLET | Freq: Every day | ORAL | Status: DC
  Administered 2021-03-07 – 2021-03-09 (×3): 325 mg via ORAL
  Filled 2021-03-07 (×3): qty 1

## 2021-03-07 MED ORDER — FUROSEMIDE 10 MG/ML IJ SOLN
40.0000 mg | Freq: Two times a day (BID) | INTRAMUSCULAR | Status: DC
  Administered 2021-03-07 (×2): 40 mg via INTRAVENOUS
  Filled 2021-03-07 (×2): qty 4

## 2021-03-07 NOTE — ED Triage Notes (Signed)
Pt bib ems from [place] with c/o chest pain. Pt stated the pain feels like a "hole" in her heart that radiates to her jaw and chest. Pt received 1x nitroglycerin and 324 mg aspirin en route. Pt has hx DM, CHF, COPD, and Dementia.  BP 112/56 HR 80  Nasal Canula baseline 4L 98% CBG 179  NSR when no pain present Bigeminy when pain is present

## 2021-03-07 NOTE — ED Notes (Signed)
Annelle Behrendt husband [phone] requesting an update on the patient

## 2021-03-07 NOTE — Progress Notes (Signed)
Courtney [name] presented for a nuclear stress test today.  No immediate complications.  Stress imaging is pending at this time.   Preliminary EKG findings may be listed in the chart, but the stress test result will not be finalized until perfusion imaging is complete.   Manson Passey, [place]  03/07/2021 12:23 PM

## 2021-03-07 NOTE — ED Notes (Signed)
Went to check on pt and pt had pulled call light from wall, pulled her oxygen out of her nose and off of the oxygen regulator on the wall and had removed some of her EKG leads. Asked pt why she did that and she said she was trying to get my attention. Explained to pt that she needed to use the call bell d/t it is unsafe to remove EKG leads and oxygen to obtain staff attention. Pt apologized and verbalized understanding. Pt placed back on monitor, call bell plugged back in and oxygen hooked back up to oxygen regulator.

## 2021-03-07 NOTE — ED Notes (Signed)
This RN was sitting at nurses station and hear pt screaming. Went in to check on pt and pt had removed EKG leads and removed oxygen. Call bell was in reach of pt. Pt did not use call bell. Educated pt again about using call bell instead of removing equipment and screaming. Pt apologized and verbalized understanding. Oxygen and EKG leads placed back on pt. Call bell in reach.

## 2021-03-07 NOTE — ED Notes (Signed)
Nuc Med called. Per Nuc Med keep pt NPO for study. They will send for pt once they have the needed dose for study.

## 2021-03-07 NOTE — H&P (Signed)
History and Physical    Courtney [name] EHU:[id number] DOB: 30-Sep-1945 DOA: 03/07/2021  PCP: Jordan Hawks, PA-C   Patient coming from: SNF  Chief Complaint: Chest pain with SOB  HPI: Courtney [name] is a 75 y.o. female with medical history significant for HTN, HFpEF, COPD, DMT2, obesity, HLD who presents for evaluation of chest pain.  She reports she was laying in bed watching TV around 830 last night when she developed a chest pressure that she describes as something heavy sitting on her chest.  It stayed in her central chest region and did not radiate.  She reports it was associated with shortness of breath but she did not have any diaphoresis or nausea and vomiting.  She was given nitroglycerin by the staff at the nursing home which helped and when EMS arrived she was given a second nitroglycerin and is now chest pain-free.  She states that the pain felt similar to when she had an MI in the past.  Not had any fever, chills, cough, syncope, numbness or weakness of extremities, visual change, slurred speech. She has history of smoking but quit over a year ago she states.  She denies alcohol or illicit drug use.  ED Course: She has been hemodynamically stable in the emergency room.  Initial troponin was negative.  Chest x-ray shows mild pulmonary edema and BNP is mildly elevated.  CBC is unremarkable.  Sodium 140 potassium 3.9 chloride 98 bicarb 34 creatinine 0.81 BUN 11 glucose 197 calcium 9.3 lipase 29 albumin 2.9 LFTs normal, troponin 16, BNP 303.9.  Patient had a hemoglobin A1c a month ago that was 9.1.  Hospitalist service was asked to evaluate and provide further work-up  Review of Systems:  General: Denies fever, chills, weight loss, night sweats.  Denies dizziness.  Denies change in appetite HENT: Denies head trauma, headache, denies change in hearing, tinnitus.  Denies nasal congestion or bleeding.  Denies sore throat.  Denies difficulty swallowing Eyes: Denies blurry vision, pain in  eye, drainage.  Denies discoloration of eyes. Neck: Denies pain.  Denies swelling.  Denies pain with movement. Cardiovascular: Reports chest pain. Denies palpitations.  Has chronic edema.  Denies orthopnea Respiratory: Reports shortness of breath.  Denies wheezing.  Denies sputum production Gastrointestinal: Denies abdominal pain, swelling.  Denies nausea, vomiting, diarrhea.  Denies melena.  Denies hematemesis. Musculoskeletal: Denies limitation of movement.  Denies deformity or swelling.  Denies pain.  Denies arthralgias or myalgias. Genitourinary: Denies pelvic pain.  Denies urinary frequency or hesitancy.  Denies dysuria.  Skin: Denies rash.  Denies petechiae, purpura, ecchymosis. Neurological: Denies syncope.  Denies seizure activity.  Denies paresthesia.  Denies slurred speech, drooping face.  Denies visual change. Psychiatric: Denies depression, anxiety.  Denies hallucinations.  Past Medical History:  Diagnosis Date   Anemia    Arthritis    Asthma    CAD (coronary artery disease)    COPD (chronic obstructive pulmonary disease) (HCC)    Diabetes mellitus    Hyperlipidemia    Hypertension    Myocardial infarct (HCC)    Postmenopausal atrophic vaginitis    Restless leg syndrome     Past Surgical History:  Procedure Laterality Date   BIOPSY  02/09/2021   Procedure: BIOPSY;  Surgeon: Imogene Burn, MD;  Location: [facility] ENDOSCOPY;  Service: Gastroenterology;;   CHOLECYSTECTOMY  1970's   ESOPHAGOGASTRODUODENOSCOPY (EGD) WITH PROPOFOL N/A 02/09/2021   Procedure: ESOPHAGOGASTRODUODENOSCOPY (EGD) WITH PROPOFOL;  Surgeon: Imogene Burn, MD;  Location: [facility] ENDOSCOPY;  Service: Gastroenterology;  Laterality: N/A;   INCISION AND DRAINAGE ABSCESS Right 06/27/2018   Procedure: 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.;  Surgeon: Romie Levee, MD;  Location: [facility] ORS;  Service: General;  Laterality: Right;   INCISION AND DRAINAGE PERIRECTAL ABSCESS N/A 06/25/2018    Procedure: IRRIGATION AND DEBRIDEMENT PERIRECTAL ABCESS;  Surgeon: Romie Levee, MD;  Location: [facility] ORS;  Service: General;  Laterality: N/A;   IRRIGATION AND DEBRIDEMENT ABSCESS N/A 07/01/2018   Procedure: IRRIGATION AND DEBRIDEMENT ABSCESS;  Surgeon: Emelia Loron, MD;  Location: [facility] ORS;  Service: General;  Laterality: N/A;   OTHER SURGICAL HISTORY  1970's   hysterectomy   WOUND DEBRIDEMENT N/A 07/03/2018   Procedure: DEBRIDEMENT PELVIS WITH DRESSING CHANGE;  Surgeon: Emelia Loron, MD;  Location: [facility] ORS;  Service: General;  Laterality: N/A;    Social History  reports that she has been smoking. She has been smoking an average of .5 packs per day. She has never used smokeless tobacco. She reports that she does not drink alcohol and does not use drugs.  Allergies  Allergen Reactions   Penicillins Other (See Comments)    Did it involve swelling of the face/tongue/throat, SOB, or low BP? No Did it involve sudden or severe rash/hives, skin peeling, or any reaction on the inside of your mouth or nose? Yes Did you need to seek medical attention at a hospital or doctor's office? yes When did it last happen?      more than 10 years If all above answers are "NO", may proceed with cephalosporin use.     Family History  Problem Relation Age of Onset   Hypertension Brother    Heart attack Neg Hx      Prior to Admission medications   Medication Sig Start Date End Date Taking? Authorizing Provider  acetaminophen (TYLENOL) 650 MG CR tablet Take 650 mg by mouth every 6 (six) hours as needed for pain.    [provider]  amLODipine (NORVASC) 10 MG tablet Take 1 tablet (10 mg total) by mouth daily. 07/09/18   Dhungel, Nishant, MD  arformoterol (BROVANA) 15 MCG/2ML NEBU Take 2 mLs (15 mcg total) by nebulization 2 (two) times daily. 10/21/19   Rhetta Mura, MD  aspirin EC 81 MG EC tablet Take 1 tablet (81 mg total) by mouth daily. 07/09/18   Dhungel, Theda Belfast, MD  atorvastatin  (LIPITOR) 80 MG tablet Take 80 mg by mouth at bedtime.    [provider]  budesonide (PULMICORT) 0.25 MG/2ML nebulizer solution Take 2 mLs (0.25 mg total) by nebulization 2 (two) times daily. 10/21/19   Rhetta Mura, MD  D-5000 125 MCG (5000 UT) TABS Take 1 tablet by mouth daily. 09/19/19   [provider]  ferrous sulfate 325 (65 FE) MG tablet Take 1 tablet (325 mg total) by mouth 2 (two) times daily with a meal. Patient taking differently: Take 325 mg by mouth daily with breakfast. 10/21/19   Rhetta Mura, MD  fluticasone (FLONASE) 50 MCG/ACT nasal spray Place 1 spray into both nostrils daily. 09/19/19   [provider]  furosemide (LASIX) 40 MG tablet Take 1 tablet (40 mg total) by mouth daily. 02/13/21   Meredeth Ide, MD  insulin aspart (NOVOLOG) 100 UNIT/ML injection Inject 0-16 Units into the skin See admin instructions. Inject before meals and at bedtime, per sliding scale: 0-150 = 0U 151-200 = 4U 201-250 = 6U 251-300 = 8U 301-350 = 10U 351-400 = 14U 401-450U = 16U    [provider]  ipratropium-albuterol (DUONEB) 0.5-2.5 (3) MG/3ML SOLN Take 3 mLs by nebulization every 6 (six) hours as needed. Patient not taking: No sig reported 07/08/18   Dhungel, Nishant, MD  LANTUS SOLOSTAR 100 UNIT/ML Solostar Pen Inject 21 Units into the skin 2 (two) times daily. 04/16/18   [provider]  melatonin 3 MG TABS tablet Take 1 tablet (3 mg total) by mouth at bedtime. 10/21/19   Rhetta Mura, MD  memantine (NAMENDA) 5 MG tablet Take 5 mg by mouth at bedtime.    [provider]  metFORMIN (GLUCOPHAGE) 1000 MG tablet Take 1,000 mg by mouth 2 (two) times daily with a meal. 12/19/20   [provider]  metoprolol succinate (TOPROL-XL) 25 MG 24 hr tablet Take 25 mg by mouth daily.    [provider]  pantoprazole (PROTONIX) 40 MG tablet Take 1 tablet (40 mg total) by mouth 2 (two) times daily. 02/12/21 04/15/21  Meredeth Ide, MD  polyethylene glycol (MIRALAX / GLYCOLAX) 17 g packet Take 17 g by mouth daily.    [provider]  saccharomyces boulardii (FLORASTOR) 250 MG capsule Take 250 mg by mouth 2 (two) times daily.    [provider]  senna (SENOKOT) 8.6 MG TABS tablet Take 2 tablets by mouth daily.    [provider]  sertraline (ZOLOFT) 50 MG tablet Take 50 mg by mouth daily.     [provider]  tiotropium (SPIRIVA) 18 MCG inhalation capsule Place 18 mcg into inhaler and inhale daily.    [provider]    Physical Exam: Vitals:   03/07/21 0430 03/07/21 0445 03/07/21 0500 03/07/21 0515  BP: 116/68 120/61 119/61 117/61  Pulse: 77 72 74 73  Resp: (!) Temp:      TempSrc:      SpO2: 100% 100% 100% 100%  Height:        Constitutional: NAD, calm, comfortable Vitals:   03/07/21 0430 03/07/21 0445 03/07/21 0500 03/07/21 0515  BP: 116/68 120/61 119/61 117/61  Pulse: 77 72 74 73  Resp: (!) Temp:      TempSrc:      SpO2: 100% 100% 100% 100%  Height:       General: WDWN, Alert and oriented x3.  Eyes: EOMI, PERRL, conjunctivae normal.  Sclera nonicteric HENT:  [place]/AT, external ears normal.  Nares patent without epistasis.  Mucous membranes are moist. Posterior pharynx clear Neck: Soft, normal range of motion, supple, no masses, Trachea midline Respiratory: Equal breath sounds with diffuse scattered Rales and expiratory wheezing, no crackles. Normal respiratory effort. No accessory muscle use.  Cardiovascular: Regular rate and rhythm, no murmurs / rubs / gallops. Mild lower extremity edema. 2+ pedal pulses.  Abdomen: Soft, no tenderness, nondistended, no rebound or guarding.  No masses palpated. Obese. Bowel sounds normoactive Musculoskeletal: FROM. no cyanosis. No joint deformity upper and lower extremities.  Normal muscle tone.  Skin: Warm, dry, intact no rashes, lesions, ulcers. No induration Neurologic: CN 2-12 grossly  intact.  Normal speech.  Sensation intact to touch. Strength 5/5 in all extremities.   Psychiatric: Normal judgment and insight.  Normal mood.    Labs on Admission: I have personally reviewed following labs and imaging studies  CBC: Recent Labs  Lab 03/07/21 0033  WBC 9.6  NEUTROABS 6.7  HGB 11.7*  HCT 41.4  MCV 90.8  PLT 178    Basic Metabolic Panel: Recent Labs  Lab 03/07/21 0033  NA  140  K 3.9  CL 98  CO2 34*  GLUCOSE 197*  BUN 11  CREATININE 0.81  CALCIUM 9.3    GFR: CrCl cannot be calculated (Unknown ideal weight.).  Liver Function Tests: Recent Labs  Lab 03/07/21 0033  AST 30  ALT 35  ALKPHOS 112  BILITOT 0.5  PROT 6.2*  ALBUMIN 2.9*    Urine analysis:    Component Value Date/Time   COLORURINE YELLOW 02/04/2021 0630   APPEARANCEUR CLOUDY (A) 02/04/2021 0630   LABSPEC 1.025 02/04/2021 0630   PHURINE 6.0 02/04/2021 0630   GLUCOSEU NEGATIVE 02/04/2021 0630   HGBUR SMALL (A) 02/04/2021 0630   BILIRUBINUR NEGATIVE 02/04/2021 0630   KETONESUR NEGATIVE 02/04/2021 0630   PROTEINUR 30 (A) 02/04/2021 0630   UROBILINOGEN 1.0 02/11/2010 1320   NITRITE POSITIVE (A) 02/04/2021 0630   LEUKOCYTESUR LARGE (A) 02/04/2021 0630    Radiological Exams on Admission: DG Chest Portable 1 View  Result Date: 03/07/2021 CLINICAL DATA:  Chest pain EXAM: PORTABLE CHEST 1 VIEW COMPARISON:  03/03/2021 FINDINGS: Mild cardiomegaly with bilateral mid lung opacities, possibly mild pulmonary edema. No pneumothorax or sizable pleural effusion. IMPRESSION: Mild cardiomegaly and possible mild pulmonary edema. Electronically Signed   By: Deatra Robinson M.D.   On: 03/07/2021 00:56    EKG: Independently reviewed.  EKG shows normal sinus rhythm with no acute ST elevation or depression.  QTc 4 8  Assessment/Plan Principal Problem:   Acute on chronic diastolic CHF (congestive heart failure)  Ms. Vanasten is placed on cardiac telemetry for observation.  She will be diuresed with Lasix  twice a day for the next 2 days.  Monitor I&O's and daily weight. Monitor serial troponin levels. Patient had a recent echocardiogram that showed EF of 60 to 65% with diastolic dysfunction  Active Problems:   Chest pain Nuclear medicine stress test to evaluate for ischemia    Hypertension Continue home medications of Norvasc and metoprolol.  Monitor blood pressure    DM2 (diabetes mellitus, type 2)  Continue Lantus.  Blood sugars were monitored with meals and bedtime.  Corrective insulin provided as needed for glycemic control.  Patient had a recent hemoglobin A1c so would not be repeated at this time    CAD (coronary artery disease) Continue home regimen.    COPD (chronic obstructive pulmonary disease) Continue DuoNebs, Brovana and Pulmicort.  Supplemental oxygen as needed to keep O2 sat between 92 to 96%     DVT prophylaxis: Lovenox for DVT prophylaxis Code Status:   DNR Family Communication:  Diagnosis and plan discussed with patient.  She verbalized understanding agrees with plan.  Further recommendation follow as clinical indicated Disposition Plan:   Patient is from:  SNF  Anticipated DC to:  SNF  Anticipated DC date:  Anticipate less than 2 midnight stay in the hospital   Admission status:  Observation  Claudean Severance Courtlyn Aki MD [facility]  How to contact the [facility] Attending or Consulting provider 7A - 7P or covering provider during after hours 7P -7A, for this patient?   Check the care team in [facility] and look for a) attending/consulting [facility] provider listed and b) the [facility] team listed Log into [email or web address] and use [facility]'s universal password to access. If you do not have the password, please contact the hospital operator. Locate the [facility] provider you are looking for under [facility] and page to a number that you can be directly reached. If you still have difficulty reaching the provider, please page the [facility] (Director  on Call) for the Hospitalists listed on  amion for assistance.  03/07/2021, 5:37 AM

## 2021-03-07 NOTE — ED Provider Notes (Signed)
[facility] EMERGENCY DEPARTMENT Provider Note   CSN: [id number] Arrival date & time: 03/07/21  [id number]     History Chief Complaint  Patient presents with   Chest Pain    Courtney [name] is a 75 y.o. female.  Patient brought in by EMS from her living facility with chest pain.  She has a history of COPD and chronic oxygen, diabetes, hypertension, previous MI with stent, sleep apnea.  EMS reports she developed chest pain and pressure about 8:30 PM while resting.  She describes a "hole in my chest" with pressure and pain rating to her back.  Some increase shortness of breath.  Facility gave her 1 nitroglycerin without much relief.  EMS gave her a second nitroglycerin she is now chest pain-free.  States the pain feels similar to her previous MI.  Pain was coming and going since about 8:30 PM.  Denies any chest or back pain currently.  Did have some nausea and hot flashes with the pain.  No abdominal pain or vomiting.  No cough or fever.  No leg pain or leg swelling.  Feels she is breathing at baseline now.  Did receive nebulizer from EMS as well  The history is provided by the patient and the EMS personnel.  Chest Pain Associated symptoms: shortness of breath   Associated symptoms: no abdominal pain, no dizziness, no fever, no headache, no nausea, no vomiting and no weakness       Past Medical History:  Diagnosis Date   Anemia    Arthritis    Asthma    CAD (coronary artery disease)    COPD (chronic obstructive pulmonary disease) (HCC)    Diabetes mellitus    Hyperlipidemia    Hypertension    Myocardial infarct ([place])    Postmenopausal atrophic vaginitis    Restless leg syndrome     Patient Active Problem List   Diagnosis Date Noted   Dysphagia    Acute respiratory failure with hypoxia ([place]) 02/04/2021   Acute on chronic diastolic CHF (congestive heart failure) ([place]) 02/04/2021   Severe sepsis ([place]) 02/04/2021   CAP (community acquired pneumonia) Q000111Q    Acute metabolic encephalopathy Q000111Q   DM2 (diabetes mellitus, type 2) (HCC)    Acute exacerbation of chronic obstructive pulmonary disease (COPD) ([place]) 02/03/2021   Acute on chronic respiratory failure with hypoxia ([place]) 10/18/2019   COPD with acute exacerbation ([place]) 10/18/2019   New onset of congestive heart failure ([place]) 10/18/2019   Multifocal pneumonia 10/18/2019   Hyperkalemia 10/18/2019   Hypertension 07/08/2018   Obesity, Class III, BMI 40-49.9 (morbid obesity) ([place]) 07/08/2018   Sepsis ([place])    Diabetes mellitus due to underlying condition, uncontrolled, with hyperglycemia, with long-term current use of insulin ([place]) 06/28/2018   Cigarette smoker 06/28/2018   OSA and COPD overlap syndrome ([place]) 06/26/2018   Pressure injury of skin 06/26/2018   Fournier's gangrene in female 06/25/2018   Hypertensive heart disease 08/21/2016   Normocytic anemia    Old MI (myocardial infarction)    HLD (hyperlipidemia)    Postmenopausal atrophic vaginitis    Restless leg syndrome    COPD (chronic obstructive pulmonary disease) (HCC)    Arthritis    CAD (coronary artery disease)    Iron (Fe) deficiency anemia 04/05/2011    Past Surgical History:  Procedure Laterality Date   BIOPSY  02/09/2021   Procedure: BIOPSY;  Surgeon: Sharyn Creamer, MD;  Location: [facility];  Service: Gastroenterology;;   CHOLECYSTECTOMY  [phone]  ESOPHAGOGASTRODUODENOSCOPY (EGD) WITH PROPOFOL N/A 02/09/2021   Procedure: ESOPHAGOGASTRODUODENOSCOPY (EGD) WITH PROPOFOL;  Surgeon: Sharyn Creamer, MD;  Location: [facility];  Service: Gastroenterology;  Laterality: N/A;   INCISION AND DRAINAGE ABSCESS Right 06/27/2018   Procedure: 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.;  Surgeon: Leighton Ruff, MD;  Location: [facility];  Service: General;  Laterality: Right;   INCISION AND DRAINAGE PERIRECTAL ABSCESS N/A 06/25/2018   Procedure: IRRIGATION AND DEBRIDEMENT PERIRECTAL ABCESS;  Surgeon: Leighton Ruff, MD;  Location: [facility];  Service: General;  Laterality: N/A;   IRRIGATION AND DEBRIDEMENT ABSCESS N/A 07/01/2018   Procedure: IRRIGATION AND DEBRIDEMENT ABSCESS;  Surgeon: Rolm Bookbinder, MD;  Location: [facility];  Service: General;  Laterality: N/A;   OTHER SURGICAL HISTORY  1970's   hysterectomy   WOUND DEBRIDEMENT N/A 07/03/2018   Procedure: DEBRIDEMENT PELVIS WITH DRESSING CHANGE;  Surgeon: Rolm Bookbinder, MD;  Location: [facility];  Service: General;  Laterality: N/A;     OB History   No obstetric history on file.     Family History  Problem Relation Age of Onset   Hypertension Brother    Heart attack Neg Hx     Social History   Tobacco Use   Smoking status: Every Day    Packs/day: 0.50    Types: Cigarettes   Smokeless tobacco: Never  Substance Use Topics   Alcohol use: No   Drug use: No    Home Medications Prior to Admission medications   Medication Sig Start Date End Date Taking? Authorizing Provider  acetaminophen (TYLENOL) 650 MG CR tablet Take 650 mg by mouth every 6 (six) hours as needed for pain.    [provider]  amLODipine (NORVASC) 10 MG tablet Take 1 tablet (10 mg total) by mouth daily. 07/09/18   Dhungel, Nishant, MD  arformoterol (BROVANA) 15 MCG/2ML NEBU Take 2 mLs (15 mcg total) by nebulization 2 (two) times daily. 10/21/19   Nita Sells, MD  aspirin EC 81 MG EC tablet Take 1 tablet (81 mg total) by mouth daily. 07/09/18   Dhungel, Flonnie Overman, MD  atorvastatin (LIPITOR) 80 MG tablet Take 80 mg by mouth at bedtime.    [provider]  budesonide (PULMICORT) 0.25 MG/2ML nebulizer solution Take 2 mLs (0.25 mg total) by nebulization 2 (two) times daily. 10/21/19   Nita Sells, MD  D-5000 125 MCG (5000 UT) TABS Take 1 tablet by mouth daily. 09/19/19   [provider]  ferrous sulfate 325 (65 FE) MG tablet Take 1 tablet (325 mg total) by mouth 2 (two) times daily with a meal. Patient taking differently: Take 325 mg by  mouth daily with breakfast. 10/21/19   Nita Sells, MD  fluticasone (FLONASE) 50 MCG/ACT nasal spray Place 1 spray into both nostrils daily. 09/19/19   [provider]  furosemide (LASIX) 40 MG tablet Take 1 tablet (40 mg total) by mouth daily. 02/13/21   Oswald Hillock, MD  insulin aspart (NOVOLOG) 100 UNIT/ML injection Inject 0-16 Units into the skin See admin instructions. Inject before meals and at bedtime, per sliding scale: 0-150 = 0U 151-200 = 4U 201-250 = 6U 251-300 = 8U 301-350 = 10U 351-400 = 14U 401-450U = 16U    [provider]  ipratropium-albuterol (DUONEB) 0.5-2.5 (3) MG/3ML SOLN Take 3 mLs by nebulization every 6 (six) hours as needed. Patient not taking: No sig reported 07/08/18   Dhungel, Nishant, MD  LANTUS SOLOSTAR 100 UNIT/ML Solostar Pen Inject 21 Units into the skin 2 (  two) times daily. 04/16/18   [provider]  melatonin 3 MG TABS tablet Take 1 tablet (3 mg total) by mouth at bedtime. 10/21/19   Nita Sells, MD  memantine (NAMENDA) 5 MG tablet Take 5 mg by mouth at bedtime.    [provider]  metFORMIN (GLUCOPHAGE) 1000 MG tablet Take 1,000 mg by mouth 2 (two) times daily with a meal. 12/19/20   [provider]  metoprolol succinate (TOPROL-XL) 25 MG 24 hr tablet Take 25 mg by mouth daily.    [provider]  pantoprazole (PROTONIX) 40 MG tablet Take 1 tablet (40 mg total) by mouth 2 (two) times daily. 02/12/21 04/15/21  Oswald Hillock, MD  polyethylene glycol (MIRALAX / GLYCOLAX) 17 g packet Take 17 g by mouth daily.    [provider]  saccharomyces boulardii (FLORASTOR) 250 MG capsule Take 250 mg by mouth 2 (two) times daily.    [provider]  senna (SENOKOT) 8.6 MG TABS tablet Take 2 tablets by mouth daily.    [provider]  sertraline (ZOLOFT) 50 MG tablet Take 50 mg by mouth daily.     [provider]  tiotropium (SPIRIVA) 18 MCG inhalation capsule Place 18  mcg into inhaler and inhale daily.    [provider]    Allergies    Penicillins  Review of Systems   Review of Systems  Constitutional:  Negative for activity change, appetite change and fever.  HENT:  Negative for congestion.   Respiratory:  Positive for chest tightness and shortness of breath.   Cardiovascular:  Positive for chest pain.  Gastrointestinal:  Negative for abdominal pain, nausea and vomiting.  Genitourinary:  Negative for dysuria and hematuria.  Musculoskeletal:  Negative for arthralgias and myalgias.  Skin:  Negative for rash.  Neurological:  Negative for dizziness, weakness and headaches.   all other systems are negative except as noted in the HPI and PMH.   Physical Exam Updated Vital Signs BP 116/68   Pulse 77   Temp 99.4 F (37.4 C) (Oral)   Resp (!) 23   Ht 5\' 3"  (1.6 m)   SpO2 100%   BMI 41.75 kg/m   Physical Exam Vitals and nursing note reviewed.  Constitutional:      General: She is not in acute distress.    Appearance: Normal appearance. She is well-developed. She is obese. She is not ill-appearing.     Comments: Chronically ill appearing  HENT:     Head: Normocephalic and atraumatic.     Mouth/Throat:     Pharynx: No oropharyngeal exudate.  Eyes:     Conjunctiva/sclera: Conjunctivae normal.     Pupils: Pupils are equal, round, and reactive to light.  Neck:     Comments: No meningismus. Cardiovascular:     Rate and Rhythm: Normal rate and regular rhythm.     Heart sounds: Normal heart sounds. No murmur heard. Pulmonary:     Effort: Pulmonary effort is normal. No respiratory distress.     Breath sounds: Rhonchi present.  Chest:     Chest wall: No tenderness.  Abdominal:     Palpations: Abdomen is soft.     Tenderness: There is no abdominal tenderness. There is no guarding or rebound.  Musculoskeletal:        General: No tenderness. Normal range of motion.     Cervical back: Normal range of motion and neck supple.  Skin:     General: Skin is warm.  Neurological:  General: No focal deficit present.     Mental Status: She is alert and oriented to person, place, and time. Mental status is at baseline.     Cranial Nerves: No cranial nerve deficit.     Motor: No abnormal muscle tone.     Coordination: Coordination normal.     Comments:  5/5 strength throughout. CN 2-12 intact.Equal grip strength.   Psychiatric:        Behavior: Behavior normal.    ED Results / Procedures / Treatments   Labs (all labs ordered are listed, but only abnormal results are displayed) Labs Reviewed  CBC WITH DIFFERENTIAL/PLATELET - Abnormal; Notable for the following components:      Result Value   Hemoglobin 11.7 (*)    MCH 25.7 (*)    MCHC 28.3 (*)    RDW 15.9 (*)    All other components within normal limits  COMPREHENSIVE METABOLIC PANEL - Abnormal; Notable for the following components:   CO2 34 (*)    Glucose, Bld 197 (*)    Total Protein 6.2 (*)    Albumin 2.9 (*)    All other components within normal limits  BRAIN NATRIURETIC PEPTIDE - Abnormal; Notable for the following components:   B Natriuretic Peptide 303.9 (*)    All other components within normal limits  RESP PANEL BY RT-PCR (FLU A&B, COVID) ARPGX2  LIPASE, BLOOD  POC OCCULT BLOOD, ED  TROPONIN I (HIGH SENSITIVITY)  TROPONIN I (HIGH SENSITIVITY)    EKG EKG Interpretation  Date/Time:  Monday March 07 2021 00:59:24 EST Ventricular Rate:  82 PR Interval:  277 QRS Duration: 87 QT Interval:  349 QTC Calculation: 408 R Axis:   7 Text Interpretation: Sinus rhythm Prolonged PR interval bigeminy resolved Confirmed by Glynn Octave [phone]) on 03/07/2021 1:04:03 AM  Radiology DG Chest Portable 1 View  Result Date: 03/07/2021 CLINICAL DATA:  Chest pain EXAM: PORTABLE CHEST 1 VIEW COMPARISON:  03/03/2021 FINDINGS: Mild cardiomegaly with bilateral mid lung opacities, possibly mild pulmonary edema. No pneumothorax or sizable pleural effusion. IMPRESSION:  Mild cardiomegaly and possible mild pulmonary edema. Electronically Signed   By: Deatra Robinson M.D.   On: 03/07/2021 00:56    Procedures Procedures   Medications Ordered in ED Medications - No data to display  ED Course  I have reviewed the triage vital signs and the nursing notes.  Pertinent labs & imaging results that were available during my care of the patient were reviewed by me and considered in my medical decision making (see chart for details).    MDM Rules/Calculators/A&P                          Episode of chest pain since 2030.  Pain resolved after 2 nitroglycerin.  Feels similar to previous MI.  EKG for EMS showed bigeminy without acute ST elevation  With episodes of chest pain in the ED.  Resolved after nitroglycerin.  Chest x-ray shows minimal edema.  No hypoxia on her home oxygen.  Dose of IV Lasix was given.  Troponin negative.  Heart score is 5  Remains chest pain-free.  Hemoglobin has down trended slightly from 14 to 11.7.  Hemoccult is negative.  No indication for transfusion at this point  Resting comfortably on nasal cannula oxygen.  No increased work of breathing.  With elevated heart score and recurrent chest pain without recent ischemic testing will plan observation admission. Discussed with Dr. Rachael Darby.  Final Clinical Impression(s) /  ED Diagnoses Final diagnoses:  None    Rx / DC Orders ED Discharge Orders     None        Vaness Jelinski, Annie Main, MD 03/07/21 [phone]

## 2021-03-07 NOTE — ED Notes (Signed)
Pt transported to Nuc Med. 

## 2021-03-07 NOTE — ED Notes (Signed)
Pt ostomy bag found to have gotten full of gas or stool and come loose from stoma.  Stool was all over her abdomen.  I got her cleaned up and have gotten a new ostomy bag ordered.

## 2021-03-07 NOTE — ED Notes (Signed)
Teletransport entered.  ?

## 2021-03-07 NOTE — ED Notes (Signed)
Pt still in Nuc Med

## 2021-03-07 NOTE — Progress Notes (Signed)
Patient ID: Courtney [name], female   DOB: 05/21/1945, 75 y.o.   MRN: [id number]  PROGRESS NOTE    Courtney [name]  [id number] DOB: Nov 12, 1945 DOA: 03/07/2021 PCP: Jordan Hawks, PA-C    Brief Narrative:  Patient is a 75 year old with PMH significant for HTN, HFpEF, COPD, T2DM, obesity, hyperlipidemia, who presented for chest pain that improved with nitroglycerin.  In the ED was noted to have mild pulmonary edema and a mildly elevated BNP.  She is admitted for chest pain to rule out MI as well as for IV diuresis for mild CHF exacerbation.   Assessment & Plan:   Principal Problem:   Acute on chronic diastolic CHF (congestive heart failure) (HCC) Active Problems:   COPD (chronic obstructive pulmonary disease) (HCC)   CAD (coronary artery disease)   Hypertension   DM2 (diabetes mellitus, type 2) (HCC)   Chest pain  Acute on chronic diastolic CHF (congestive heart failure)  Telemetry IV Lasix x2 days Monitor I&O's and daily weight. Monitor serial troponin levels. Patient had a recent echocardiogram that showed EF of 60 to 65% with diastolic dysfunction   Chest pain Nuclear medicine stress test to evaluate for new ischemia   Hypertension Continue Norvasc and metoprolol.   Monitor blood pressure   DM2 (diabetes mellitus, type 2)  Continue Lantus.   CBG monitoring  Sliding scale insulin  CAD (coronary artery disease) Continue home regimen.   COPD (chronic obstructive pulmonary disease) Continue DuoNebs, Brovana and Pulmicort.    DVT prophylaxis: Lovenox SQ Code Status: DNR  Family Communication: Patient at bedside Disposition Plan: SNF Patient is admitted to observation due to ongoing work-up.  Transitional care consultation will be obtained for return to her SNF.   Consultants:  None  Procedures: Nuclear medicine stress test  Antimicrobials: Anti-infectives (From admission, onward)    None        Subjective: Patient reports that she is currently chest  pain-free.  She denies any trouble with her breathing.  Objective: Vitals:   03/07/21 0815 03/07/21 0830 03/07/21 0845 03/07/21 0900  BP: 129/68 109/78 (!) 113/57 (!) 110/58  Pulse: 77 76 75 74  Resp: 19 17 19 17   Temp:      TempSrc:      SpO2: 98% 99% 100% 90%  Height:       No intake or output data in the 24 hours ending 03/07/21 0943 There were no vitals filed for this visit.  Examination:  General exam: Appears calm and comfortable  Respiratory system: Diffuse scattered Rales, few expiratory wheezes respiratory effort normal. Cardiovascular system: S1 & S2 heard, RRR.  Gastrointestinal system: Abdomen is nondistended, soft and nontender.  Central nervous system: Alert and oriented. No focal neurological deficits. Extremities: Symmetric, no significant edema Skin: No rashes Psychiatry: Judgement and insight appear normal. Mood & affect appropriate.     Data Reviewed: I have personally reviewed following labs and imaging studies  CBC: Recent Labs  Lab 03/07/21 0033  WBC 9.6  NEUTROABS 6.7  HGB 11.7*  HCT 41.4  MCV 90.8  PLT 178   Basic Metabolic Panel: Recent Labs  Lab 03/07/21 0033  NA 140  K 3.9  CL 98  CO2 34*  GLUCOSE 197*  BUN 11  CREATININE 0.81  CALCIUM 9.3   GFR: CrCl cannot be calculated (Unknown ideal weight.). Liver Function Tests: Recent Labs  Lab 03/07/21 0033  AST 30  ALT 35  ALKPHOS 112  BILITOT 0.5  PROT 6.2*  ALBUMIN 2.9*  Recent Labs  Lab 03/07/21 0033  LIPASE 29   CBG: Recent Labs  Lab 03/07/21 0814  GLUCAP 112*    Recent Results (from the past 240 hour(s))  Resp Panel by RT-PCR (Flu A&B, Covid) Nasopharyngeal Swab     Status: None   Collection Time: 03/07/21  4:18 AM   Specimen: Nasopharyngeal Swab; Nasopharyngeal(NP) swabs in vial transport medium  Result Value Ref Range Status   SARS Coronavirus 2 by RT PCR NEGATIVE NEGATIVE Final    Comment: (NOTE) SARS-CoV-2 target nucleic acids are NOT DETECTED.  The  SARS-CoV-2 RNA is generally detectable in upper respiratory specimens during the acute phase of infection. The lowest concentration of SARS-CoV-2 viral copies this assay can detect is 138 copies/mL. A negative result does not preclude SARS-Cov-2 infection and should not be used as the sole basis for treatment or other patient management decisions. A negative result may occur with  improper specimen collection/handling, submission of specimen other than nasopharyngeal swab, presence of viral mutation(s) within the areas targeted by this assay, and inadequate number of viral copies(<138 copies/mL). A negative result must be combined with clinical observations, patient history, and epidemiological information. The expected result is Negative.  Fact Sheet for Patients:  BloggerCourse.com  Fact Sheet for Healthcare Providers:  SeriousBroker.it  This test is no t yet approved or cleared by the [place] FDA and  has been authorized for detection and/or diagnosis of SARS-CoV-2 by FDA under an Emergency Use Authorization (EUA). This EUA will remain  in effect (meaning this test can be used) for the duration of the COVID-19 declaration under Section 564(b)(1) of the Act, 21 U.S.C.section 360bbb-3(b)(1), unless the authorization is terminated  or revoked sooner.       Influenza A by PCR NEGATIVE NEGATIVE Final   Influenza B by PCR NEGATIVE NEGATIVE Final    Comment: (NOTE) The Xpert Xpress SARS-CoV-2/FLU/RSV plus assay is intended as an aid in the diagnosis of influenza from Nasopharyngeal swab specimens and should not be used as a sole basis for treatment. Nasal washings and aspirates are unacceptable for Xpert Xpress SARS-CoV-2/FLU/RSV testing.  Fact Sheet for Patients: BloggerCourse.com  Fact Sheet for Healthcare Providers: SeriousBroker.it  This test is not yet approved or  cleared by the [place] FDA and has been authorized for detection and/or diagnosis of SARS-CoV-2 by FDA under an Emergency Use Authorization (EUA). This EUA will remain in effect (meaning this test can be used) for the duration of the COVID-19 declaration under Section 564(b)(1) of the Act, 21 U.S.C. section 360bbb-3(b)(1), unless the authorization is terminated or revoked.  Performed at [facility], [address], [place], [place] [address]       Radiology Studies: DG Chest Portable 1 View  Result Date: 03/07/2021 CLINICAL DATA:  Chest pain EXAM: PORTABLE CHEST 1 VIEW COMPARISON:  03/03/2021 FINDINGS: Mild cardiomegaly with bilateral mid lung opacities, possibly mild pulmonary edema. No pneumothorax or sizable pleural effusion. IMPRESSION: Mild cardiomegaly and possible mild pulmonary edema. Electronically Signed   By: Deatra Robinson M.D.   On: 03/07/2021 00:56     Scheduled Meds:  amLODipine  10 mg Oral Daily   arformoterol  15 mcg Nebulization BID   aspirin EC  81 mg Oral Daily   atorvastatin  80 mg Oral QHS   budesonide  0.25 mg Nebulization BID   enoxaparin (LOVENOX) injection  40 mg Subcutaneous Q24H   ferrous sulfate  325 mg Oral Q breakfast   furosemide  40 mg Intravenous  Q12H   insulin aspart  0-15 Units Subcutaneous TID WC   insulin glargine  21 Units Subcutaneous BID   metFORMIN  1,000 mg Oral BID WC   metoprolol succinate  25 mg Oral Daily   pantoprazole  40 mg Oral BID   saccharomyces boulardii  250 mg Oral BID   senna  2 tablet Oral Daily   sertraline  50 mg Oral Daily   sodium chloride flush  3 mL Intravenous Q12H   Continuous Infusions:  sodium chloride       LOS: 0 days    Reva Bores, MD 03/07/2021 9:43 AM [phone] [facility] If 7PM-7AM, please contact night-coverage 03/07/2021, 9:43 AM

## 2021-03-07 NOTE — ED Notes (Signed)
New ostomy bag applied with the help of Michaelene Song.  PT cleaned up, linens changed, gown applied.

## 2021-03-07 NOTE — ED Notes (Signed)
Pt eating dinner

## 2021-03-08 DIAGNOSIS — Z7984 Long term (current) use of oral hypoglycemic drugs: Secondary | ICD-10-CM | POA: Diagnosis not present

## 2021-03-08 DIAGNOSIS — G2581 Restless legs syndrome: Secondary | ICD-10-CM | POA: Diagnosis present

## 2021-03-08 DIAGNOSIS — I5033 Acute on chronic diastolic (congestive) heart failure: Secondary | ICD-10-CM | POA: Diagnosis present

## 2021-03-08 DIAGNOSIS — I2510 Atherosclerotic heart disease of native coronary artery without angina pectoris: Secondary | ICD-10-CM | POA: Diagnosis present

## 2021-03-08 DIAGNOSIS — Z880 Allergy status to penicillin: Secondary | ICD-10-CM | POA: Diagnosis not present

## 2021-03-08 DIAGNOSIS — Z91012 Allergy to eggs: Secondary | ICD-10-CM | POA: Diagnosis not present

## 2021-03-08 DIAGNOSIS — Z66 Do not resuscitate: Secondary | ICD-10-CM | POA: Diagnosis present

## 2021-03-08 DIAGNOSIS — E119 Type 2 diabetes mellitus without complications: Secondary | ICD-10-CM | POA: Diagnosis present

## 2021-03-08 DIAGNOSIS — Z7982 Long term (current) use of aspirin: Secondary | ICD-10-CM | POA: Diagnosis not present

## 2021-03-08 DIAGNOSIS — F17210 Nicotine dependence, cigarettes, uncomplicated: Secondary | ICD-10-CM | POA: Diagnosis present

## 2021-03-08 DIAGNOSIS — R079 Chest pain, unspecified: Secondary | ICD-10-CM | POA: Diagnosis present

## 2021-03-08 DIAGNOSIS — E785 Hyperlipidemia, unspecified: Secondary | ICD-10-CM | POA: Diagnosis present

## 2021-03-08 DIAGNOSIS — Z6841 Body Mass Index (BMI) 40.0 and over, adult: Secondary | ICD-10-CM | POA: Diagnosis not present

## 2021-03-08 DIAGNOSIS — Z8249 Family history of ischemic heart disease and other diseases of the circulatory system: Secondary | ICD-10-CM | POA: Diagnosis not present

## 2021-03-08 DIAGNOSIS — Z794 Long term (current) use of insulin: Secondary | ICD-10-CM | POA: Diagnosis not present

## 2021-03-08 DIAGNOSIS — J449 Chronic obstructive pulmonary disease, unspecified: Secondary | ICD-10-CM | POA: Diagnosis present

## 2021-03-08 DIAGNOSIS — I252 Old myocardial infarction: Secondary | ICD-10-CM | POA: Diagnosis not present

## 2021-03-08 DIAGNOSIS — Z7951 Long term (current) use of inhaled steroids: Secondary | ICD-10-CM | POA: Diagnosis not present

## 2021-03-08 DIAGNOSIS — Z20822 Contact with and (suspected) exposure to covid-19: Secondary | ICD-10-CM | POA: Diagnosis present

## 2021-03-08 DIAGNOSIS — I110 Hypertensive heart disease with heart failure: Secondary | ICD-10-CM | POA: Diagnosis present

## 2021-03-08 DIAGNOSIS — I509 Heart failure, unspecified: Secondary | ICD-10-CM

## 2021-03-08 LAB — BASIC METABOLIC PANEL
Anion gap: 8 (ref 5–15)
BUN: 15 mg/dL (ref 8–23)
CO2: 35 mmol/L — ABNORMAL HIGH (ref 22–32)
Calcium: 9.3 mg/dL (ref 8.9–10.3)
Chloride: 95 mmol/L — ABNORMAL LOW (ref 98–111)
Creatinine, Ser: 0.74 mg/dL (ref 0.44–1.00)
GFR, Estimated: >60 mL/min (ref >60)
Glucose, Bld: 135 mg/dL — ABNORMAL HIGH (ref 70–99)
Potassium: 4.1 mmol/L (ref 3.5–5.1)
Sodium: 138 mmol/L (ref 135–145)

## 2021-03-08 LAB — GLUCOSE, CAPILLARY
Glucose-Capillary: 119 mg/dL — ABNORMAL HIGH (ref 70–99)
Glucose-Capillary: 201 mg/dL — ABNORMAL HIGH (ref 70–99)
Glucose-Capillary: 57 mg/dL — ABNORMAL LOW (ref 70–99)
Glucose-Capillary: 78 mg/dL (ref 70–99)
Glucose-Capillary: 95 mg/dL (ref 70–99)

## 2021-03-08 LAB — MRSA NEXT GEN BY PCR, NASAL: MRSA by PCR Next Gen: NOT DETECTED

## 2021-03-08 LAB — CBC
HCT: 39.6 % (ref 36.0–46.0)
Hemoglobin: 11.6 g/dL — ABNORMAL LOW (ref 12.0–15.0)
MCH: 25.6 pg — ABNORMAL LOW (ref 26.0–34.0)
MCHC: 29.3 g/dL — ABNORMAL LOW (ref 30.0–36.0)
MCV: 87.2 fL (ref 80.0–100.0)
Platelets: 183 10*3/uL (ref 150–400)
RBC: 4.54 MIL/uL (ref 3.87–5.11)
RDW: 15.7 % — ABNORMAL HIGH (ref 11.5–15.5)
WBC: 11.1 10*3/uL — ABNORMAL HIGH (ref 4.0–10.5)
nRBC: 0 % (ref 0.0–0.2)

## 2021-03-08 MED ORDER — FUROSEMIDE 10 MG/ML IJ SOLN
40.0000 mg | Freq: Two times a day (BID) | INTRAMUSCULAR | Status: DC
  Administered 2021-03-08 – 2021-03-09 (×4): 40 mg via INTRAVENOUS
  Filled 2021-03-08 (×4): qty 4

## 2021-03-08 MED ORDER — DEXTROSE 50 % IV SOLN
INTRAVENOUS | Status: AC
  Filled 2021-03-08: qty 50

## 2021-03-08 MED ORDER — TIOTROPIUM BROMIDE MONOHYDRATE 18 MCG IN CAPS: 18.0000 ug | ORAL_CAPSULE | Freq: Every day | RESPIRATORY_TRACT | Status: DC

## 2021-03-08 MED ORDER — DEXTROSE 50 % IV SOLN
12.5000 g | INTRAVENOUS | Status: AC
  Administered 2021-03-08: 12.5 g via INTRAVENOUS
  Filled 2021-03-08: qty 50

## 2021-03-08 MED ORDER — DEXTROSE 50 % IV SOLN
12.5000 g | INTRAVENOUS | Status: AC
  Administered 2021-03-08: 12.5 g via INTRAVENOUS

## 2021-03-08 MED ORDER — UMECLIDINIUM BROMIDE 62.5 MCG/ACT IN AEPB
1.0000 | INHALATION_SPRAY | Freq: Every day | RESPIRATORY_TRACT | Status: DC
  Administered 2021-03-09: 1 via RESPIRATORY_TRACT
  Filled 2021-03-08: qty 7

## 2021-03-08 NOTE — Progress Notes (Addendum)
Patient ID: Courtney [name], female   DOB: [birth year], 75 y.o.   MRN: [id number]  PROGRESS NOTE    Courtney [name]  LPF:[id number] DOB: 1945/12/03 DOA: 03/07/2021 PCP: Jordan Hawks, PA-C    Brief Narrative:  Patient is a 75 year old with PMH significant for HTN, HFpEF, COPD, T2DM, obesity, hyperlipidemia, who presented for chest pain that improved with nitroglycerin.  In the ED was noted to have mild pulmonary edema and a mildly elevated BNP.  From facility?   Assessment & Plan:   Principal Problem:   Acute on chronic diastolic CHF (congestive heart failure) (HCC) Active Problems:   COPD (chronic obstructive pulmonary disease) (HCC)   CAD (coronary artery disease)   Hypertension   DM2 (diabetes mellitus, type 2) (HCC)   Chest pain  Acute on chronic diastolic CHF (congestive heart failure)  IV Lasix  Monitor I&O's and daily weight- do not seem to be accurate Patient had a recent echocardiogram that showed EF of 60 to 65% with diastolic dysfunction   Chest pain S/p Nuclear medicine stress test to evaluate for new ischemia: low risk   Hypertension Continue Norvasc and metoprolol.   Monitor blood pressure   DM2 (diabetes mellitus, type 2)  Continue Lantus.   Sliding scale insulin  CAD (coronary artery disease) Continue home regimen.   COPD (chronic obstructive pulmonary disease) Continue DuoNebs, Brovana and Pulmicort.    Patinet in on [place] at 4l -- unclear if she wears this at baseline- wean as able (home med rec list shows 2L [place])   DVT prophylaxis: Lovenox SQ Code Status: DNR  Disposition Plan: from SNF   Consultants:  None    Subjective: Say she does not wear O2 normally   Objective: Vitals:   03/07/21 2003 03/08/21 0020 03/08/21 0358 03/08/21 0754  BP:  122/66 128/65   Pulse:  88 88   Resp:  20 20   Temp:  98.2 F (36.8 C) 98 F (36.7 C)   TempSrc:  Oral Oral   SpO2: 100% 92% 99% 99%  Weight:   107 kg   Height:        Intake/Output Summary  (Last 24 hours) at 03/08/2021 1105 Last data filed at 03/08/2021 0500 Gross per 24 hour  Intake --  Output 700 ml  Net -700 ml   Filed Weights   03/07/21 1856 03/08/21 0358  Weight: (P) 107.1 kg 107 kg    Examination:   General: Appearance:    Severely obese female in no acute distress     Lungs:     respirations unlabored, diminished, on [place]  Heart:    Normal heart rate. + edema   MS:   All extremities are intact.    Neurologic:   Will awaken, oriented to person       Data Reviewed: I have personally reviewed following labs and imaging studies  CBC: Recent Labs  Lab 03/07/21 0033 03/08/21 0421  WBC 9.6 11.1*  NEUTROABS 6.7  --   HGB 11.7* 11.6*  HCT 41.4 39.6  MCV 90.8 87.2  PLT 178 183   Basic Metabolic Panel: Recent Labs  Lab 03/07/21 0033 03/08/21 0421  NA 140 138  K 3.9 4.1  CL 98 95*  CO2 34* 35*  GLUCOSE 197* 135*  BUN 11 15  CREATININE 0.81 0.74  CALCIUM 9.3 9.3   GFR: Estimated Creatinine Clearance: 69 mL/min (by C-G formula based on SCr of 0.74 mg/dL). Liver Function Tests: Recent Labs  Lab 03/07/21 0033  AST 30  ALT 35  ALKPHOS 112  BILITOT 0.5  PROT 6.2*  ALBUMIN 2.9*   Recent Labs  Lab 03/07/21 0033  LIPASE 29   CBG: Recent Labs  Lab 03/07/21 0814 03/07/21 1345 03/07/21 1651 03/07/21 2047 03/08/21 0643  GLUCAP 112* 102* 106* 154* 119*    Recent Results (from the past 240 hour(s))  Resp Panel by RT-PCR (Flu A&B, Covid) Nasopharyngeal Swab     Status: None   Collection Time: 03/07/21  4:18 AM   Specimen: Nasopharyngeal Swab; Nasopharyngeal(NP) swabs in vial transport medium  Result Value Ref Range Status   SARS Coronavirus 2 by RT PCR NEGATIVE NEGATIVE Final    Comment: (NOTE) SARS-CoV-2 target nucleic acids are NOT DETECTED.  The SARS-CoV-2 RNA is generally detectable in upper respiratory specimens during the acute phase of infection. The lowest concentration of SARS-CoV-2 viral copies this assay can detect  is 138 copies/mL. A negative result does not preclude SARS-Cov-2 infection and should not be used as the sole basis for treatment or other patient management decisions. A negative result may occur with  improper specimen collection/handling, submission of specimen other than nasopharyngeal swab, presence of viral mutation(s) within the areas targeted by this assay, and inadequate number of viral copies(<138 copies/mL). A negative result must be combined with clinical observations, patient history, and epidemiological information. The expected result is Negative.  Fact Sheet for Patients:  BloggerCourse.com  Fact Sheet for Healthcare Providers:  SeriousBroker.it  This test is no t yet approved or cleared by the [place] FDA and  has been authorized for detection and/or diagnosis of SARS-CoV-2 by FDA under an Emergency Use Authorization (EUA). This EUA will remain  in effect (meaning this test can be used) for the duration of the COVID-19 declaration under Section 564(b)(1) of the Act, 21 U.S.C.section 360bbb-3(b)(1), unless the authorization is terminated  or revoked sooner.       Influenza A by PCR NEGATIVE NEGATIVE Final   Influenza B by PCR NEGATIVE NEGATIVE Final    Comment: (NOTE) The Xpert Xpress SARS-CoV-2/FLU/RSV plus assay is intended as an aid in the diagnosis of influenza from Nasopharyngeal swab specimens and should not be used as a sole basis for treatment. Nasal washings and aspirates are unacceptable for Xpert Xpress SARS-CoV-2/FLU/RSV testing.  Fact Sheet for Patients: BloggerCourse.com  Fact Sheet for Healthcare Providers: SeriousBroker.it  This test is not yet approved or cleared by the [place] FDA and has been authorized for detection and/or diagnosis of SARS-CoV-2 by FDA under an Emergency Use Authorization (EUA). This EUA will remain in effect  (meaning this test can be used) for the duration of the COVID-19 declaration under Section 564(b)(1) of the Act, 21 U.S.C. section 360bbb-3(b)(1), unless the authorization is terminated or revoked.  Performed at [facility], [address], [place], [place] [address]       Radiology Studies: NM Myocar Multi W/Spect W/Wall Motion / EF  Result Date: 03/07/2021   The study is normal. The study is low risk.   No ST deviation was noted. Arrhythmias during stress: frequent PVCs. Arrhythmias during recovery: frequent PVCs.   LV perfusion is normal. There is no evidence of ischemia. There is no evidence of infarction.   Left ventricular function is normal. Nuclear stress EF: 57 %. The left ventricular ejection fraction is normal (55-65%). End diastolic cavity size is normal.   DG Chest Portable 1 View  Result Date: 03/07/2021 CLINICAL DATA:  Chest pain EXAM: PORTABLE CHEST 1  VIEW COMPARISON:  03/03/2021 FINDINGS: Mild cardiomegaly with bilateral mid lung opacities, possibly mild pulmonary edema. No pneumothorax or sizable pleural effusion. IMPRESSION: Mild cardiomegaly and possible mild pulmonary edema. Electronically Signed   By: Deatra Robinson M.D.   On: 03/07/2021 00:56     Scheduled Meds:  amLODipine  10 mg Oral Daily   arformoterol  15 mcg Nebulization BID   aspirin EC  81 mg Oral Daily   atorvastatin  80 mg Oral QHS   budesonide  0.25 mg Nebulization BID   enoxaparin (LOVENOX) injection  40 mg Subcutaneous Q24H   ferrous sulfate  325 mg Oral Q breakfast   furosemide  40 mg Intravenous BID   insulin aspart  0-15 Units Subcutaneous TID WC   insulin glargine  21 Units Subcutaneous BID   metFORMIN  1,000 mg Oral BID WC   metoprolol succinate  25 mg Oral Daily   pantoprazole  40 mg Oral BID   saccharomyces boulardii  250 mg Oral BID   senna  2 tablet Oral Daily   sertraline  50 mg Oral Daily   sodium chloride flush  3 mL Intravenous Q12H   Continuous Infusions:  sodium  chloride       LOS: 0 days    Joseph Art, DO 03/08/2021 11:05 AM [facility] If 7PM-7AM, please contact night-coverage 03/08/2021, 11:05 AM

## 2021-03-08 NOTE — Progress Notes (Signed)
Hypoglycemic Event  CBG: 57  Treatment: D50 25 mL (12.5 gm)  Symptoms: Shaky and Nervous/irritable  Follow-up CBG: Time:1704 CBG Result:78  Possible Reasons for Event: Inadequate meal intake  Comments/MD notified: Dr. Benjamine Mola made aware    Courtney [name]

## 2021-03-08 NOTE — Progress Notes (Signed)
Found patient on room air and sat was 86%. Placed patient on 2L and sats increased to 88%, 91% on 3L and finally up to 4L and sats maintain at 96%. Will continue to monitor.

## 2021-03-09 LAB — BASIC METABOLIC PANEL
Anion gap: 10 (ref 5–15)
BUN: 14 mg/dL (ref 8–23)
CO2: 37 mmol/L — ABNORMAL HIGH (ref 22–32)
Calcium: 9.4 mg/dL (ref 8.9–10.3)
Chloride: 93 mmol/L — ABNORMAL LOW (ref 98–111)
Creatinine, Ser: 0.77 mg/dL (ref 0.44–1.00)
GFR, Estimated: >60 mL/min (ref >60)
Glucose, Bld: 134 mg/dL — ABNORMAL HIGH (ref 70–99)
Potassium: 3.6 mmol/L (ref 3.5–5.1)
Sodium: 140 mmol/L (ref 135–145)

## 2021-03-09 LAB — GLUCOSE, CAPILLARY
Glucose-Capillary: 143 mg/dL — ABNORMAL HIGH (ref 70–99)
Glucose-Capillary: 165 mg/dL — ABNORMAL HIGH (ref 70–99)
Glucose-Capillary: 265 mg/dL — ABNORMAL HIGH (ref 70–99)
Glucose-Capillary: 351 mg/dL — ABNORMAL HIGH (ref 70–99)

## 2021-03-09 LAB — RESP PANEL BY RT-PCR (FLU A&B, COVID) ARPGX2
Influenza A by PCR: NEGATIVE
Influenza B by PCR: NEGATIVE
SARS Coronavirus 2 by RT PCR: NEGATIVE

## 2021-03-09 NOTE — TOC Transition Note (Signed)
Transition of Care [facility]) - CM/SW Discharge Note   Patient Details  Name: Courtney [name] MRN: [id number] Date of Birth: 21-Oct-1945  [facility]) CM/SW Contact:  Lynett Grimes Phone Number: 03/09/2021, 1:22 PM   Clinical Narrative:    Patient will DC to: [place] Anticipated DC date: 03/09/2021 Family notified: Attempted pt daughter Transport by: Sharin Mons   Per MD patient ready for DC to [facility]. RN to call report prior to discharge [phone]). RN, patient, patient's family, and facility notified of DC. Discharge Summary and FL2 sent to facility. DC packet on chart. Ambulance transport requested for patient.   CSW will sign off for now as social work intervention is no longer needed. Please consult [place] again if new needs arise.           Patient Goals and CMS Choice        Discharge Placement                       Discharge Plan and Services                                     Social Determinants of Health (SDOH) Interventions     Readmission Risk Interventions No flowsheet data found.

## 2021-03-09 NOTE — Plan of Care (Signed)

## 2021-03-09 NOTE — Discharge Summary (Signed)
Physician Discharge Summary  Courtney [name] ZOX:[id number] DOB: 1945-06-10 DOA: 03/07/2021  PCP: Jordan Hawks, PA-C  Admit date: 03/07/2021 Discharge date: 03/09/2021 30 Day Unplanned Readmission Risk Score    Flowsheet Row ED to Hosp-Admission (Current) from 03/07/2021 in [facility] 3E HF PCU  30 Day Unplanned Readmission Risk Score (%) 20.37 Filed at 03/09/2021 1200       This score is the patient's risk of an unplanned readmission within 30 days of being discharged (0 -100%). The score is based on dignosis, age, lab data, medications, orders, and past utilization.   Low:  0-14.9   Medium: 15-21.9   High: 22-29.9   Extreme: 30 and above          Admitted From: SNF Disposition: SNF  Recommendations for Outpatient Follow-up:  Follow up with PCP in 1-2 weeks Please obtain BMP/CBC in one week Please follow up with your PCP on the following pending results: Unresulted Labs (From admission, onward)     Start     Ordered   03/09/21 1011  Resp Panel by RT-PCR (Flu A&B, Covid) Nasopharyngeal Swab  (Tier 2 - Symptomatic/asymptomatic)  Once,   R        03/09/21 1010              Home Health: None Equipment/Devices: None  Discharge Condition: Stable CODE STATUS: Full code Diet recommendation: Cardiac  Subjective: Seen and examined.  She has no complaints.  Brief/Interim Summary: Patient is a 75 year old with PMH significant for HTN, HFpEF, COPD, T2DM, obesity, hyperlipidemia, who presented for chest pain that improved with nitroglycerin.  In the ED was noted to have mild pulmonary edema and a mildly elevated BNP and was admitted to hospital service with the diagnosis of acute on chronic diastolic congestive heart failure.  She was started on IV Lasix.  She also had complained of some chest pain as mentioned above, her troponins were completely negative.  She underwent nuclear stress test which was also negative/low risk yesterday.  Rest of the medical issues remained  stable.  Patient chronically uses 2 L of oxygen which is what she is using now.  She is medically stable so she is being discharged back to her SNF today.  Discharge Diagnoses:  Principal Problem:   Acute on chronic diastolic CHF (congestive heart failure) (HCC) Active Problems:   COPD (chronic obstructive pulmonary disease) (HCC)   CAD (coronary artery disease)   Hypertension   DM2 (diabetes mellitus, type 2) (HCC)   Chest pain   Acute exacerbation of CHF (congestive heart failure) (HCC)    Discharge Instructions   Allergies as of 03/09/2021       Reactions   Eggs Or Egg-derived Products    Per MAR   Penicillins Other (See Comments)   Did it involve swelling of the face/tongue/throat, SOB, or low BP? No Did it involve sudden or severe rash/hives, skin peeling, or any reaction on the inside of your mouth or nose? Yes Did you need to seek medical attention at a hospital or doctor's office? yes When did it last happen?      more than 10 years If all above answers are "NO", may proceed with cephalosporin use.        Medication List     TAKE these medications    acetaminophen 650 MG CR tablet Commonly known as: TYLENOL Take 650 mg by mouth every 8 (eight) hours as needed for pain.   amLODipine 10 MG tablet Commonly known  as: NORVASC Take 1 tablet (10 mg total) by mouth daily.   arformoterol 15 MCG/2ML Nebu Commonly known as: BROVANA Take 2 mLs (15 mcg total) by nebulization 2 (two) times daily.   aspirin 81 MG EC tablet Take 1 tablet (81 mg total) by mouth daily.   atorvastatin 80 MG tablet Commonly known as: LIPITOR Take 80 mg by mouth at bedtime.   budesonide 0.25 MG/2ML nebulizer solution Commonly known as: PULMICORT Take 2 mLs (0.25 mg total) by nebulization 2 (two) times daily.   D-5000 125 MCG (5000 UT) Tabs Generic drug: Cholecalciferol Take 5,000 Units by mouth daily.   ferrous sulfate 325 (65 FE) MG tablet Take 1 tablet (325 mg total) by mouth 2  (two) times daily with a meal. What changed: when to take this   fluticasone 50 MCG/ACT nasal spray Commonly known as: FLONASE Place 1 spray into both nostrils daily.   furosemide 40 MG tablet Commonly known as: LASIX Take 1 tablet (40 mg total) by mouth daily.   insulin aspart 100 UNIT/ML injection Commonly known as: novoLOG Inject 0-16 Units into the skin See admin instructions. Inject before meals and at bedtime, per sliding scale: 0-150 = 0U 151-200 = 4U 201-250 = 6U 251-300 = 8U 301-350 = 10U 351-400 = 14U 401-450U = 16U   ipratropium-albuterol 0.5-2.5 (3) MG/3ML Soln Commonly known as: DUONEB Take 3 mLs by nebulization every 6 (six) hours as needed. What changed: when to take this   Lantus SoloStar 100 UNIT/ML Solostar Pen Generic drug: insulin glargine Inject 21 Units into the skin 2 (two) times daily.   melatonin 3 MG Tabs tablet Take 1 tablet (3 mg total) by mouth at bedtime.   memantine 5 MG tablet Commonly known as: NAMENDA Take 5 mg by mouth at bedtime.   metFORMIN 1000 MG tablet Commonly known as: GLUCOPHAGE Take 1,000 mg by mouth 2 (two) times daily with a meal.   metoprolol succinate 25 MG 24 hr tablet Commonly known as: TOPROL-XL Take 25 mg by mouth daily.   nitroGLYCERIN 0.4 MG SL tablet Commonly known as: NITROSTAT Place 0.4 mg under the tongue every 5 (five) minutes as needed for chest pain.   OXYGEN Inhale 2 L into the lungs continuous.   pantoprazole 40 MG tablet Commonly known as: PROTONIX Take 1 tablet (40 mg total) by mouth 2 (two) times daily.   polyethylene glycol 17 g packet Commonly known as: MIRALAX / GLYCOLAX Take 17 g by mouth daily.   saccharomyces boulardii 250 MG capsule Commonly known as: FLORASTOR Take 250 mg by mouth 2 (two) times daily.   senna 8.6 MG Tabs tablet Commonly known as: SENOKOT Take 2 tablets by mouth 2 (two) times daily.   sertraline 50 MG tablet Commonly known as: ZOLOFT Take 50 mg by mouth  daily.   tiotropium 18 MCG inhalation capsule Commonly known as: SPIRIVA Place 18 mcg into inhaler and inhale daily.        Follow-up Information     Jordan Hawks, PA-C Follow up in 1 week(s).   Specialty: Physician Assistant Contact information: [address] [place] [address] [phone]                Allergies  Allergen Reactions   Eggs Or Egg-Derived Products     Per MAR   Penicillins Other (See Comments)    Did it involve swelling of the face/tongue/throat, SOB, or low BP? No Did it involve sudden or severe rash/hives,  skin peeling, or any reaction on the inside of your mouth or nose? Yes Did you need to seek medical attention at a hospital or doctor's office? yes When did it last happen?      more than 10 years If all above answers are "NO", may proceed with cephalosporin use.     Consultations: None   Procedures/Studies: DG Chest 2 View  Result Date: 03/07/2021 CLINICAL DATA:  Chronic obstructive pulmonary disease. EXAM: CHEST - 2 VIEW COMPARISON:  02/05/2021 FINDINGS: Mild cardiomegaly with pulmonary vascular congestion. No focal airspace consolidation. No pleural effusion or pneumothorax. IMPRESSION: Mild cardiomegaly and pulmonary vascular congestion. Electronically Signed   By: Deatra Robinson M.D.   On: 03/07/2021 00:57   NM Myocar Multi W/Spect W/Wall Motion / EF  Result Date: 03/07/2021   The study is normal. The study is low risk.   No ST deviation was noted. Arrhythmias during stress: frequent PVCs. Arrhythmias during recovery: frequent PVCs.   LV perfusion is normal. There is no evidence of ischemia. There is no evidence of infarction.   Left ventricular function is normal. Nuclear stress EF: 57 %. The left ventricular ejection fraction is normal (55-65%). End diastolic cavity size is normal.   DG Chest Portable 1 View  Result Date: 03/07/2021 CLINICAL DATA:  Chest pain EXAM: PORTABLE CHEST 1 VIEW COMPARISON:  03/03/2021  FINDINGS: Mild cardiomegaly with bilateral mid lung opacities, possibly mild pulmonary edema. No pneumothorax or sizable pleural effusion. IMPRESSION: Mild cardiomegaly and possible mild pulmonary edema. Electronically Signed   By: Deatra Robinson M.D.   On: 03/07/2021 00:56   DG Swallowing Func-Speech Pathology  Result Date: 02/07/2021 Table formatting from the original result was not included. Objective Swallowing Evaluation: Type of Study: MBS-Modified Barium Swallow Study  Patient Details Name: ADRINE HAYWORTH MRN: [id number] Date of Birth: 1946/02/12 Today's Date: 02/07/2021 Time: SLP Start Time (ACUTE ONLY): 1230 -SLP Stop Time (ACUTE ONLY): 1250 SLP Time Calculation (min) (ACUTE ONLY): 20 min Past Medical History: Past Medical History: Diagnosis Date  Anemia   Arthritis   Asthma   CAD (coronary artery disease)   COPD (chronic obstructive pulmonary disease) (HCC)   Diabetes mellitus   Hyperlipidemia   Hypertension   Myocardial infarct (HCC)   Postmenopausal atrophic vaginitis   Restless leg syndrome  Past Surgical History: Past Surgical History: Procedure Laterality Date  CHOLECYSTECTOMY  1970's  INCISION AND DRAINAGE ABSCESS Right 06/27/2018  Procedure: 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.;  Surgeon: Romie Levee, MD;  Location: [facility];  Service: General;  Laterality: Right;  INCISION AND DRAINAGE PERIRECTAL ABSCESS N/A 06/25/2018  Procedure: IRRIGATION AND DEBRIDEMENT PERIRECTAL ABCESS;  Surgeon: Romie Levee, MD;  Location: [facility];  Service: General;  Laterality: N/A;  IRRIGATION AND DEBRIDEMENT ABSCESS N/A 07/01/2018  Procedure: IRRIGATION AND DEBRIDEMENT ABSCESS;  Surgeon: Emelia Loron, MD;  Location: [facility];  Service: General;  Laterality: N/A;  OTHER SURGICAL HISTORY  1970's  hysterectomy  WOUND DEBRIDEMENT N/A 07/03/2018  Procedure: DEBRIDEMENT PELVIS WITH DRESSING CHANGE;  Surgeon: Emelia Loron, MD;  Location: [facility];  Service: General;  Laterality: N/A; HPI: TAHIRIH LAIR is a 75 y.o. female with medical history significant for COPD, type 2 diabetes mellitus, hypertension, hyperlipidemia, chronic diastolic heart failure, who is admitted to [facility] on 02/03/2021 with acute hypoxic respiratory distress after presenting from snf to [facility] ED for evaluation of increased somnolence.  ED work-up significant for temperature of 99.3, oxygen saturation 88% on room air, chest x-ray  significant for vascular congestion, she is with diffuse wheezing and diminished air entry, admitted for further management.Pt coughing after [place].  No data recorded Assessment / Plan / Recommendation CHL IP CLINICAL IMPRESSIONS 02/07/2021 Clinical Impression Pt demonstrates a primairly oral dysphagia with lingual rocking of bolus and decreased bolus cohesion with spillage of the head of bolus with tail remaining in oral cavity. There are instances of aspiration before the swallow with thin and also after as pt transits the remaining bolus to an open airway. Sensation is inconsistent and often delayed. Pt also observed to have backflow of liquids from esophagus to pharynx with belching and esophageal sweep shows moderate stasis in cervical and thoracic esophageal column. Pt has a moderate risk of postprandial aspiration. Recommend ongoing use of nectar thick liquids and pureed solids with esophageal precautions, primairly slow rate and upright positioning and sustaining upright position after meals. Will follow for potential for uprgrade which may be possible if mentation improves as pt was still weak and inattentive today. SLP Visit Diagnosis Dysphagia, oral phase (R13.11) Attention and concentration deficit following -- Frontal lobe and executive function deficit following -- Impact on safety and function Mild aspiration risk;Risk for inadequate nutrition/hydration   CHL IP TREATMENT RECOMMENDATION 02/07/2021 Treatment Recommendations Therapy as outlined in treatment plan below   Prognosis  02/07/2021 Prognosis for Safe Diet Advancement Fair Barriers to Reach Goals Cognitive deficits Barriers/Prognosis Comment -- CHL IP DIET RECOMMENDATION 02/07/2021 SLP Diet Recommendations Dysphagia 1 (Puree) solids;Nectar thick liquid Liquid Administration via Cup;Straw Medication Administration Crushed with puree Compensations Slow rate;Small sips/bites Postural Changes Seated upright at 90 degrees;Remain semi-upright after after feeds/meals (Comment)   No flowsheet data found.  CHL IP FOLLOW UP RECOMMENDATIONS 02/07/2021 Follow up Recommendations Skilled Nursing facility   [facility] IP FREQUENCY AND DURATION 02/07/2021 Speech Therapy Frequency (ACUTE ONLY) min 2x/week Treatment Duration 2 weeks      CHL IP ORAL PHASE 02/07/2021 Oral Phase Impaired Oral - Pudding Teaspoon -- Oral - Pudding Cup -- Oral - Honey Teaspoon -- Oral - Honey Cup -- Oral - Nectar Teaspoon -- Oral - Nectar Cup -- Oral - Nectar Straw Decreased bolus cohesion;Delayed oral transit;Premature spillage Oral - Thin Teaspoon -- Oral - Thin Cup -- Oral - Thin Straw Premature spillage;Decreased bolus cohesion;Delayed oral transit;Lingual pumping Oral - Puree Lingual pumping;Delayed oral transit;Decreased bolus cohesion;Premature spillage Oral - Mech Soft Premature spillage;Decreased bolus cohesion;Delayed oral transit;Lingual pumping Oral - Regular -- Oral - Multi-Consistency -- Oral - Pill -- Oral Phase - Comment --  CHL IP PHARYNGEAL PHASE 02/07/2021 Pharyngeal Phase Impaired Pharyngeal- Pudding Teaspoon -- Pharyngeal -- Pharyngeal- Pudding Cup -- Pharyngeal -- Pharyngeal- Honey Teaspoon -- Pharyngeal -- Pharyngeal- Honey Cup -- Pharyngeal -- Pharyngeal- Nectar Teaspoon -- Pharyngeal -- Pharyngeal- Nectar Cup -- Pharyngeal -- Pharyngeal- Nectar Straw Penetration/Aspiration before swallow Pharyngeal Material enters airway, remains ABOVE vocal cords then ejected out;Material does not enter airway Pharyngeal- Thin Teaspoon -- Pharyngeal -- Pharyngeal- Thin  Cup -- Pharyngeal -- Pharyngeal- Thin Straw Penetration/Aspiration before swallow;Penetration/Apiration after swallow;Delayed swallow initiation-pyriform sinuses Pharyngeal Material enters airway, passes BELOW cords without attempt by patient to eject out (silent aspiration);Material does not enter airway Pharyngeal- Puree Delayed swallow initiation-vallecula Pharyngeal -- Pharyngeal- Mechanical Soft Delayed swallow initiation-vallecula Pharyngeal -- Pharyngeal- Regular -- Pharyngeal -- Pharyngeal- Multi-consistency -- Pharyngeal -- Pharyngeal- Pill -- Pharyngeal -- Pharyngeal Comment --  CHL IP CERVICAL ESOPHAGEAL PHASE 02/07/2021 Cervical Esophageal Phase Impaired Pudding Teaspoon -- Pudding Cup -- Honey Teaspoon -- Honey Cup -- Nectar Teaspoon -- Nectar  Cup -- [employer] Esophageal backflow into the pharynx Thin Teaspoon -- Thin Cup -- Thin Straw -- Puree -- Mechanical Soft -- Regular -- Multi-consistency -- Pill -- Cervical Esophageal Comment -- DeBlois, Riley Nearing 02/07/2021, 1:43 PM              DG ESOPHAGUS W SINGLE CM (SOL OR THIN BA)  Result Date: 02/08/2021 CLINICAL DATA:  Dysphagia EXAM: ESOPHOGRAM/BARIUM SWALLOW TECHNIQUE: Single contrast examination was performed using  thin barium. FLUOROSCOPY TIME:  Fluoroscopy Time:  2 minute Radiation Exposure Index (if provided by the fluoroscopic device): 47.8 mGy Number of Acquired Spot Images: 0 COMPARISON:  None. FINDINGS: Normal pharyngeal anatomy and motility. Contrast flowed freely through the esophagus without evidence of a mass. Relative narrowing of the distal esophagus just proximal to the esophagogastric junction concerning for a stricture. Normal esophageal mucosa without evidence of irregularity or ulceration. Tertiary contractions of the esophagus as can be seen with esophageal spasm. No evidence of reflux. No definite hiatal hernia was demonstrated. IMPRESSION: Relative narrowing of the distal esophagus just proximal to the  esophagogastric junction concerning for a stricture. Tertiary contractions of the esophagus as can be seen with esophageal spasm. Electronically Signed   By: Elige Ko M.D.   On: 02/08/2021 15:19     Discharge Exam: Vitals:   03/08/21 2000 03/09/21 0741  BP: 139/65   Pulse: 76 84  Resp: 20 20  Temp:    SpO2:  98%   Vitals:   03/08/21 1954 03/08/21 1958 03/08/21 2000 03/09/21 0741  BP:   139/65   Pulse:  76 76 84  Resp:  20 20 20   Temp:  98.4 F (36.9 C)    TempSrc:  Oral    SpO2: 98% 100%  98%  Weight:      Height:        General: Pt is alert, awake, not in acute distress, morbidly obese Cardiovascular: RRR, S1/S2 +, no rubs, no gallops Respiratory: CTA bilaterally, no wheezing, no rhonchi Abdominal: Soft, NT, ND, bowel sounds + Extremities: no edema, no cyanosis    The results of significant diagnostics from this hospitalization (including imaging, microbiology, ancillary and laboratory) are listed below for reference.     Microbiology: Recent Results (from the past 240 hour(s))  Resp Panel by RT-PCR (Flu A&B, Covid) Nasopharyngeal Swab     Status: None   Collection Time: 03/07/21  4:18 AM   Specimen: Nasopharyngeal Swab; Nasopharyngeal(NP) swabs in vial transport medium  Result Value Ref Range Status   SARS Coronavirus 2 by RT PCR NEGATIVE NEGATIVE Final    Comment: (NOTE) SARS-CoV-2 target nucleic acids are NOT DETECTED.  The SARS-CoV-2 RNA is generally detectable in upper respiratory specimens during the acute phase of infection. The lowest concentration of SARS-CoV-2 viral copies this assay can detect is 138 copies/mL. A negative result does not preclude SARS-Cov-2 infection and should not be used as the sole basis for treatment or other patient management decisions. A negative result may occur with  improper specimen collection/handling, submission of specimen other than nasopharyngeal swab, presence of viral mutation(s) within the areas targeted by this  assay, and inadequate number of viral copies(<138 copies/mL). A negative result must be combined with clinical observations, patient history, and epidemiological information. The expected result is Negative.  Fact Sheet for Patients:  13/07/22  Fact Sheet for Healthcare Providers:  BloggerCourse.com  This test is no t yet approved or cleared by the [employer] and  has been authorized for  detection and/or diagnosis of SARS-CoV-2 by FDA under an Emergency Use Authorization (EUA). This EUA will remain  in effect (meaning this test can be used) for the duration of the COVID-19 declaration under Section 564(b)(1) of the Act, 21 U.S.C.section 360bbb-3(b)(1), unless the authorization is terminated  or revoked sooner.       Influenza A by PCR NEGATIVE NEGATIVE Final   Influenza B by PCR NEGATIVE NEGATIVE Final    Comment: (NOTE) The Xpert Xpress SARS-CoV-2/FLU/RSV plus assay is intended as an aid in the diagnosis of influenza from Nasopharyngeal swab specimens and should not be used as a sole basis for treatment. Nasal washings and aspirates are unacceptable for Xpert Xpress SARS-CoV-2/FLU/RSV testing.  Fact Sheet for Patients: BloggerCourse.com  Fact Sheet for Healthcare Providers: SeriousBroker.it  This test is not yet approved or cleared by the [place] FDA and has been authorized for detection and/or diagnosis of SARS-CoV-2 by FDA under an Emergency Use Authorization (EUA). This EUA will remain in effect (meaning this test can be used) for the duration of the COVID-19 declaration under Section 564(b)(1) of the Act, 21 U.S.C. section 360bbb-3(b)(1), unless the authorization is terminated or revoked.  Performed at [facility], [address], [place], [place] [address]   MRSA Next Gen by PCR, Nasal     Status: None   Collection Time: 03/08/21  9:59 AM    Specimen: Nasal Mucosa; Nasal Swab  Result Value Ref Range Status   MRSA by PCR Next Gen NOT DETECTED NOT DETECTED Final    Comment: (NOTE) The GeneXpert MRSA Assay (FDA approved for NASAL specimens only), is one component of a comprehensive MRSA colonization surveillance program. It is not intended to diagnose MRSA infection nor to guide or monitor treatment for MRSA infections. Test performance is not FDA approved in patients less than 38 years old. Performed at [facility], [address], [place], [place] [address]      Labs: BNP (last 3 results) Recent Labs    02/03/21 2122 02/05/21 0207 03/07/21 0033  BNP 725.1* 417.3* 303.9*   Basic Metabolic Panel: Recent Labs  Lab 03/07/21 0033 03/08/21 0421 03/09/21 0356  NA 140 138 140  K 3.9 4.1 3.6  CL 98 95* 93*  CO2 34* 35* 37*  GLUCOSE 197* 135* 134*  BUN 11 15 14   CREATININE 0.81 0.74 0.77  CALCIUM 9.3 9.3 9.4   Liver Function Tests: Recent Labs  Lab 03/07/21 0033  AST 30  ALT 35  ALKPHOS 112  BILITOT 0.5  PROT 6.2*  ALBUMIN 2.9*   Recent Labs  Lab 03/07/21 0033  LIPASE 29   No results for input(s): AMMONIA in the last 168 hours. CBC: Recent Labs  Lab 03/07/21 0033 03/08/21 0421  WBC 9.6 11.1*  NEUTROABS 6.7  --   HGB 11.7* 11.6*  HCT 41.4 39.6  MCV 90.8 87.2  PLT 178 183   Cardiac Enzymes: No results for input(s): CKTOTAL, CKMB, CKMBINDEX, TROPONINI in the last 168 hours. BNP: Invalid input(s): POCBNP CBG: Recent Labs  Lab 03/08/21 1704 03/08/21 2059 03/09/21 0556 03/09/21 0614 03/09/21 1056  GLUCAP 78 95 165* 143* 265*   D-Dimer No results for input(s): DDIMER in the last 72 hours. Hgb A1c No results for input(s): HGBA1C in the last 72 hours. Lipid Profile No results for input(s): CHOL, HDL, LDLCALC, TRIG, CHOLHDL, LDLDIRECT in the last 72 hours. Thyroid function studies No results for input(s): TSH, T4TOTAL, T3FREE, THYROIDAB in the last 72 hours.  Invalid  input(s): FREET3 Anemia work up No results for input(s): VITAMINB12, FOLATE, FERRITIN, TIBC, IRON, RETICCTPCT in the last 72 hours. Urinalysis    Component Value Date/Time   COLORURINE YELLOW 02/04/2021 0630   APPEARANCEUR CLOUDY (A) 02/04/2021 0630   LABSPEC 1.025 02/04/2021 0630   PHURINE 6.0 02/04/2021 0630   GLUCOSEU NEGATIVE 02/04/2021 0630   HGBUR SMALL (A) 02/04/2021 0630   BILIRUBINUR NEGATIVE 02/04/2021 0630   KETONESUR NEGATIVE 02/04/2021 0630   PROTEINUR 30 (A) 02/04/2021 0630   UROBILINOGEN 1.0 02/11/2010 1320   NITRITE POSITIVE (A) 02/04/2021 0630   LEUKOCYTESUR LARGE (A) 02/04/2021 0630   Sepsis Labs Invalid input(s): PROCALCITONIN,  WBC,  LACTICIDVEN Microbiology Recent Results (from the past 240 hour(s))  Resp Panel by RT-PCR (Flu A&B, Covid) Nasopharyngeal Swab     Status: None   Collection Time: 03/07/21  4:18 AM   Specimen: Nasopharyngeal Swab; Nasopharyngeal(NP) swabs in vial transport medium  Result Value Ref Range Status   SARS Coronavirus 2 by RT PCR NEGATIVE NEGATIVE Final    Comment: (NOTE) SARS-CoV-2 target nucleic acids are NOT DETECTED.  The SARS-CoV-2 RNA is generally detectable in upper respiratory specimens during the acute phase of infection. The lowest concentration of SARS-CoV-2 viral copies this assay can detect is 138 copies/mL. A negative result does not preclude SARS-Cov-2 infection and should not be used as the sole basis for treatment or other patient management decisions. A negative result may occur with  improper specimen collection/handling, submission of specimen other than nasopharyngeal swab, presence of viral mutation(s) within the areas targeted by this assay, and inadequate number of viral copies(<138 copies/mL). A negative result must be combined with clinical observations, patient history, and epidemiological information. The expected result is Negative.  Fact Sheet for Patients:   BloggerCourse.com  Fact Sheet for Healthcare Providers:  [employer]iousBroker.it  This test is no t yet approved or cleared by the [place] FDA and  has been authorized for detection and/or diagnosis of SARS-CoV-2 by FDA under an Emergency Use Authorization (EUA). This EUA will remain  in effect (meaning this test can be used) for the duration of the COVID-19 declaration under Section 564(b)(1) of the Act, 21 U.S.C.section 360bbb-3(b)(1), unless the authorization is terminated  or revoked sooner.       Influenza A by PCR NEGATIVE NEGATIVE Final   Influenza B by PCR NEGATIVE NEGATIVE Final    Comment: (NOTE) The Xpert Xpress SARS-CoV-2/FLU/RSV plus assay is intended as an aid in the diagnosis of influenza from Nasopharyngeal swab specimens and should not be used as a sole basis for treatment. Nasal washings and aspirates are unacceptable for Xpert Xpress SARS-CoV-2/FLU/RSV testing.  Fact Sheet for Patients: BloggerCourse.com  Fact Sheet for Healthcare Providers: [employer]iousBroker.it  This test is not yet approved or cleared by the [place] FDA and has been authorized for detection and/or diagnosis of SARS-CoV-2 by FDA under an Emergency Use Authorization (EUA). This EUA will remain in effect (meaning this test can be used) for the duration of the COVID-19 declaration under Section 564(b)(1) of the Act, 21 U.S.C. section 360bbb-3(b)(1), unless the authorization is terminated or revoked.  Performed at [facility], [address], [place], [place] [address]   MRSA Next Gen by PCR, Nasal     Status: None   Collection Time: 03/08/21  9:59 AM   Specimen: Nasal Mucosa; Nasal Swab  Result Value Ref Range Status   MRSA by PCR Next Gen NOT DETECTED NOT DETECTED Final  Comment: (NOTE) The GeneXpert MRSA Assay (FDA approved for NASAL specimens only), is one component of a  comprehensive MRSA colonization surveillance program. It is not intended to diagnose MRSA infection nor to guide or monitor treatment for MRSA infections. Test performance is not FDA approved in patients less than 38 years old. Performed at [facility], [address], [place], [place] [address]      Time coordinating discharge: Over 30 minutes  SIGNED:   Hughie Closs, MD  [facility] 03/09/2021, 12:30 PM  If 7PM-7AM, please contact night-coverage [email or web address]

## 2021-03-31 ENCOUNTER — Ambulatory Visit: Payer: Medicare (Managed Care) | Admitting: Internal Medicine

## 2021-04-14 ENCOUNTER — Other Ambulatory Visit: Payer: Self-pay

## 2021-04-14 ENCOUNTER — Ambulatory Visit (INDEPENDENT_AMBULATORY_CARE_PROVIDER_SITE_OTHER): Payer: Medicare (Managed Care) | Admitting: Pulmonary Disease

## 2021-04-14 ENCOUNTER — Encounter: Payer: Self-pay | Admitting: Pulmonary Disease

## 2021-04-14 VITALS — BP 120/80 | HR 70 | Temp 97.9°F

## 2021-04-14 DIAGNOSIS — J449 Chronic obstructive pulmonary disease, unspecified: Secondary | ICD-10-CM

## 2021-04-14 DIAGNOSIS — R0602 Shortness of breath: Secondary | ICD-10-CM

## 2021-04-14 NOTE — Progress Notes (Signed)
Courtney [name]    [id number]    1945-10-29  Primary Care Physician:Gordon, Gaye Alken, PA-C  Referring Physician: Jordan Hawks, PA-C [address] [place],  [place] [address]  Chief complaint:   Patient seen for COPD, concern for sleep apnea  HPI:  Denies any significant symptoms today  Uses nebulization treatment once a day Uses puffer once a day  States breathing is steady  Admits to snoring, sleepiness during the day States she has not been contacted about sleep study  Debilitated Unable to walk for the last year -She is not receiving any physical therapy at present  Goes to bed late  She is tired during the day Occasionally dry mouth in the mornings No night sweats  Has been on oxygen for about 3 years Quit smoking about a pack a day about a year ago  Record review reveals a history of COPD, hyperlipidemia, sleep apnea   Outpatient Encounter Medications as of 04/14/2021  Medication Sig   acetaminophen (TYLENOL) 650 MG CR tablet Take 650 mg by mouth every 8 (eight) hours as needed for pain.   amLODipine (NORVASC) 10 MG tablet Take 1 tablet (10 mg total) by mouth daily.   arformoterol (BROVANA) 15 MCG/2ML NEBU Take 2 mLs (15 mcg total) by nebulization 2 (two) times daily.   aspirin EC 81 MG EC tablet Take 1 tablet (81 mg total) by mouth daily.   atorvastatin (LIPITOR) 80 MG tablet Take 80 mg by mouth at bedtime.   budesonide (PULMICORT) 0.25 MG/2ML nebulizer solution Take 2 mLs (0.25 mg total) by nebulization 2 (two) times daily.   D-5000 125 MCG (5000 UT) TABS Take 5,000 Units by mouth daily.   ferrous sulfate 325 (65 FE) MG tablet Take 1 tablet (325 mg total) by mouth 2 (two) times daily with a meal. (Patient taking differently: Take 325 mg by mouth daily with breakfast.)   fluticasone (FLONASE) 50 MCG/ACT nasal spray Place 1 spray into both nostrils daily.   furosemide (LASIX) 40 MG tablet Take 1 tablet (40 mg total) by mouth daily.    insulin aspart (NOVOLOG) 100 UNIT/ML injection Inject 0-16 Units into the skin See admin instructions. Inject before meals and at bedtime, per sliding scale: 0-150 = 0U 151-200 = 4U 201-250 = 6U 251-300 = 8U 301-350 = 10U 351-400 = 14U 401-450U = 16U   ipratropium-albuterol (DUONEB) 0.5-2.5 (3) MG/3ML SOLN Take 3 mLs by nebulization every 6 (six) hours as needed. (Patient taking differently: Take 3 mLs by nebulization 4 (four) times daily.)   LANTUS SOLOSTAR 100 UNIT/ML Solostar Pen Inject 21 Units into the skin 2 (two) times daily.   melatonin 3 MG TABS tablet Take 1 tablet (3 mg total) by mouth at bedtime.   memantine (NAMENDA) 5 MG tablet Take 5 mg by mouth at bedtime.   metFORMIN (GLUCOPHAGE) 1000 MG tablet Take 1,000 mg by mouth 2 (two) times daily with a meal.   metoprolol succinate (TOPROL-XL) 25 MG 24 hr tablet Take 25 mg by mouth daily.   nitroGLYCERIN (NITROSTAT) 0.4 MG SL tablet Place 0.4 mg under the tongue every 5 (five) minutes as needed for chest pain.   OXYGEN Inhale 2 L into the lungs continuous.   pantoprazole (PROTONIX) 40 MG tablet Take 1 tablet (40 mg total) by mouth 2 (two) times daily.   polyethylene glycol (MIRALAX / GLYCOLAX) 17 g packet Take 17 g by mouth daily.   saccharomyces boulardii (  FLORASTOR) 250 MG capsule Take 250 mg by mouth 2 (two) times daily.   senna (SENOKOT) 8.6 MG TABS tablet Take 2 tablets by mouth 2 (two) times daily.   sertraline (ZOLOFT) 50 MG tablet Take 50 mg by mouth daily.    tiotropium (SPIRIVA) 18 MCG inhalation capsule Place 18 mcg into inhaler and inhale daily.   No facility-administered encounter medications on file as of 04/14/2021.    Allergies as of 04/14/2021 - Review Complete 04/14/2021  Allergen Reaction Noted   Penicillins Other (See Comments) 04/05/2011    Past Medical History:  Diagnosis Date   Anemia    Arthritis    Asthma    CAD (coronary artery disease)    COPD (chronic obstructive pulmonary disease) (HCC)     Diabetes mellitus    Hyperlipidemia    Hypertension    Myocardial infarct (HCC)    Postmenopausal atrophic vaginitis    Restless leg syndrome     Past Surgical History:  Procedure Laterality Date   BIOPSY  02/09/2021   Procedure: BIOPSY;  Surgeon: Imogene Burn, MD;  Location[facility] ENDOSCOPY;  Service: Gastroenterology;;   CHOLECYSTECTOMY  1970's   ESOPHAGOGASTRODUODENOSCOPY (EGD) WITH PROPOFOL N/A 02/09/2021   Procedure: ESOPHAGOGASTRODUODENOSCOPY (EGD) WITH PROPOFOL;  Surgeon: Imogene Burn, MD;  Location: [facility] ENDOSCOPY;  Service: Gastroenterology;  Laterality: N/A;   INCISION AND DRAINAGE ABSCESS Right 06/27/2018   Procedure: 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.;  Surgeon: Romie Levee, MD;  Location: [facility] ORS;  Service: General;  Laterality: Right;   INCISION AND DRAINAGE PERIRECTAL ABSCESS N/A 06/25/2018   Procedure: IRRIGATION AND DEBRIDEMENT PERIRECTAL ABCESS;  Surgeon: Romie Levee, MD;  Location: [facility];  Service: General;  Laterality: N/A;   IRRIGATION AND DEBRIDEMENT ABSCESS N/A 07/01/2018   Procedure: IRRIGATION AND DEBRIDEMENT ABSCESS;  Surgeon: Emelia Loron, MD;  Location: [facility];  Service: General;  Laterality: N/A;   OTHER SURGICAL HISTORY  1970's   hysterectomy   WOUND DEBRIDEMENT N/A 07/03/2018   Procedure: DEBRIDEMENT PELVIS WITH DRESSING CHANGE;  Surgeon: Emelia Loron, MD;  Location: [facility];  Service: General;  Laterality: N/A;    Family History  Problem Relation Age of Onset   Hypertension Brother    Heart attack Neg Hx     Social History   Socioeconomic History   Marital status: Married    Spouse name: Not on file   Number of children: Not on file   Years of education: Not on file   Highest education level: Not on file  Occupational History   Not on file  Tobacco Use   Smoking status: Former    Packs/day: 0.50    Types: Cigarettes    Quit date: 05/01/2018    Years since quitting: 2.9    Passive exposure: Never    Smokeless tobacco: Never  Substance and Sexual Activity   Alcohol use: No   Drug use: No   Sexual activity: Not on file  Other Topics Concern   Not on file  Social History Narrative   Not on file   Social Determinants of Health   Financial Resource Strain: Not on file  Food Insecurity: Not on file  Transportation Needs: Not on file  Physical Activity: Not on file  Stress: Not on file  Social Connections: Not on file  Intimate Partner Violence: Not on file    Review of Systems  Constitutional:  Positive for fatigue.  Respiratory:  Positive for cough and shortness of breath.   Psychiatric/Behavioral:  Positive for sleep disturbance.    Vitals:   04/14/21 1431  BP: 120/80  Pulse: 70  Temp: 97.9 F (36.6 C)  SpO2: 90%     Physical Exam Constitutional:      Appearance: She is obese.  HENT:     Head: Normocephalic.     Nose: Nose normal.     Mouth/Throat:     Mouth: Mucous membranes are moist.     Comments: Crowded oropharynx Eyes:     Pupils: Pupils are equal, round, and reactive to light.  Cardiovascular:     Rate and Rhythm: Normal rate.     Heart sounds: No murmur heard.   No friction rub.  Pulmonary:     Effort: No respiratory distress.     Breath sounds: No stridor. No wheezing or rhonchi.  Musculoskeletal:     Cervical back: No rigidity or tenderness.  Neurological:     Mental Status: She is alert.  Psychiatric:        Mood and Affect: Mood normal.   No flowsheet data found.   Epworth level report is difficult to evaluate with the patient  Data Reviewed: Last chest x-ray 03/07/2021 with some pulmonary congestion  Assessment:  History of chronic obstructive pulmonary disease -Encouraged to continue bronchodilator treatments -Has only been using Brovana and Pulmicort once a day -This needs to be twice a day -Continue Spiriva  Chronic respiratory failure -Continue oxygen supplementation  Possibility of overlap syndrome -Will require an in  lab sleep study  Plan/Recommendations:  Will need a sleep study  Continue oxygen supplementation  Continue bronchodilators  Follow-up in 3 months  Patient will benefit from physical therapy  Encouraged to call with any significant concerns  Virl Diamond MD [facility] 04/14/2021, 2:43 PM  CC: Jordan Hawks, PA-C

## 2021-04-14 NOTE — Patient Instructions (Addendum)
History of obstructive lung disease  -Ensure nebulization treatments as prescribed  -Spiriva daily as prescribed  Patient will benefit from physical therapy  We will follow-up with sleep study for concern for overlap syndrome  To new oxygen supplementation  Follow-up 3 months from today

## 2021-04-19 ENCOUNTER — Encounter (HOSPITAL_BASED_OUTPATIENT_CLINIC_OR_DEPARTMENT_OTHER): Payer: Medicare (Managed Care) | Admitting: Pulmonary Disease

## 2021-04-19 ENCOUNTER — Telehealth: Payer: Self-pay | Admitting: Pulmonary Disease

## 2021-04-19 NOTE — Telephone Encounter (Signed)
Sleep center is not able to do split night because the patient needs a hoyer lift for transfers and she is unable to do home sleep study because she requires oxygen.   AO please be aware. Thanks   Also Marita Kansas over at the sleep center states all your orders for sleep studies states for them to be done on [employer]. He wants to know whether you want them to follow the protocol meaning if their oxygen drops to titrate, etc. Thanks :)

## 2021-04-26 NOTE — Telephone Encounter (Signed)
Protocol should be followed.  Will follow patient up in office, limited options if we cannot safely have a study performed

## 2021-04-26 NOTE — Telephone Encounter (Signed)
Called [facility] back and he was with a pt. LMTCB.

## 2021-04-26 NOTE — Telephone Encounter (Signed)
[facility] aware of response per Dr [name] the pt to schedule appt and there was no answer and no option to leave msg, will call back later

## 2021-04-28 NOTE — Telephone Encounter (Signed)
Dorena Bodo- NP at [facility]) calling in regards to next steps for patient. Sleep couldn't be done. She can be reached [phone]. Please advise

## 2021-07-04 ENCOUNTER — Other Ambulatory Visit: Payer: Self-pay

## 2021-07-04 ENCOUNTER — Encounter: Payer: Self-pay | Admitting: Oncology

## 2021-07-04 ENCOUNTER — Emergency Department (HOSPITAL_COMMUNITY): Payer: Medicare (Managed Care)

## 2021-07-04 ENCOUNTER — Inpatient Hospital Stay (HOSPITAL_COMMUNITY)
Admission: EM | Admit: 2021-07-04 | Discharge: 2021-07-08 | DRG: 291 | Disposition: A | Discharge status: Skilled Nursing Facility | Payer: Medicare (Managed Care) | Source: Skilled Nursing Facility | Attending: Internal Medicine | Admitting: Internal Medicine

## 2021-07-04 ENCOUNTER — Encounter (HOSPITAL_COMMUNITY): Payer: Self-pay | Admitting: Emergency Medicine

## 2021-07-04 DIAGNOSIS — Z6838 Body mass index (BMI) 38.0-38.9, adult: Secondary | ICD-10-CM

## 2021-07-04 DIAGNOSIS — Z8249 Family history of ischemic heart disease and other diseases of the circulatory system: Secondary | ICD-10-CM

## 2021-07-04 DIAGNOSIS — F0390 Unspecified dementia without behavioral disturbance: Secondary | ICD-10-CM

## 2021-07-04 DIAGNOSIS — I5031 Acute diastolic (congestive) heart failure: Secondary | ICD-10-CM | POA: Diagnosis not present

## 2021-07-04 DIAGNOSIS — I2510 Atherosclerotic heart disease of native coronary artery without angina pectoris: Secondary | ICD-10-CM | POA: Diagnosis present

## 2021-07-04 DIAGNOSIS — E669 Obesity, unspecified: Secondary | ICD-10-CM | POA: Diagnosis present

## 2021-07-04 DIAGNOSIS — Z7401 Bed confinement status: Secondary | ICD-10-CM

## 2021-07-04 DIAGNOSIS — Z20822 Contact with and (suspected) exposure to covid-19: Secondary | ICD-10-CM | POA: Diagnosis present

## 2021-07-04 DIAGNOSIS — I5033 Acute on chronic diastolic (congestive) heart failure: Secondary | ICD-10-CM

## 2021-07-04 DIAGNOSIS — Z955 Presence of coronary angioplasty implant and graft: Secondary | ICD-10-CM

## 2021-07-04 DIAGNOSIS — Z87891 Personal history of nicotine dependence: Secondary | ICD-10-CM

## 2021-07-04 DIAGNOSIS — E875 Hyperkalemia: Secondary | ICD-10-CM | POA: Diagnosis present

## 2021-07-04 DIAGNOSIS — N1831 Chronic kidney disease, stage 3a: Secondary | ICD-10-CM | POA: Diagnosis present

## 2021-07-04 DIAGNOSIS — E1165 Type 2 diabetes mellitus with hyperglycemia: Secondary | ICD-10-CM | POA: Diagnosis present

## 2021-07-04 DIAGNOSIS — K219 Gastro-esophageal reflux disease without esophagitis: Secondary | ICD-10-CM | POA: Diagnosis present

## 2021-07-04 DIAGNOSIS — R008 Other abnormalities of heart beat: Secondary | ICD-10-CM | POA: Diagnosis present

## 2021-07-04 DIAGNOSIS — I130 Hypertensive heart and chronic kidney disease with heart failure and stage 1 through stage 4 chronic kidney disease, or unspecified chronic kidney disease: Principal | ICD-10-CM | POA: Diagnosis present

## 2021-07-04 DIAGNOSIS — G2581 Restless legs syndrome: Secondary | ICD-10-CM | POA: Diagnosis present

## 2021-07-04 DIAGNOSIS — E1122 Type 2 diabetes mellitus with diabetic chronic kidney disease: Secondary | ICD-10-CM | POA: Diagnosis present

## 2021-07-04 DIAGNOSIS — R0600 Dyspnea, unspecified: Secondary | ICD-10-CM

## 2021-07-04 DIAGNOSIS — I5043 Acute on chronic combined systolic (congestive) and diastolic (congestive) heart failure: Secondary | ICD-10-CM | POA: Diagnosis present

## 2021-07-04 DIAGNOSIS — Z9049 Acquired absence of other specified parts of digestive tract: Secondary | ICD-10-CM

## 2021-07-04 DIAGNOSIS — Z7982 Long term (current) use of aspirin: Secondary | ICD-10-CM

## 2021-07-04 DIAGNOSIS — L89892 Pressure ulcer of other site, stage 2: Secondary | ICD-10-CM | POA: Diagnosis present

## 2021-07-04 DIAGNOSIS — E785 Hyperlipidemia, unspecified: Secondary | ICD-10-CM | POA: Diagnosis present

## 2021-07-04 DIAGNOSIS — R7989 Other specified abnormal findings of blood chemistry: Secondary | ICD-10-CM | POA: Diagnosis present

## 2021-07-04 DIAGNOSIS — R0902 Hypoxemia: Secondary | ICD-10-CM

## 2021-07-04 DIAGNOSIS — J9611 Chronic respiratory failure with hypoxia: Secondary | ICD-10-CM | POA: Diagnosis present

## 2021-07-04 DIAGNOSIS — Z90710 Acquired absence of both cervix and uterus: Secondary | ICD-10-CM

## 2021-07-04 DIAGNOSIS — Z794 Long term (current) use of insulin: Secondary | ICD-10-CM

## 2021-07-04 DIAGNOSIS — I5023 Acute on chronic systolic (congestive) heart failure: Secondary | ICD-10-CM | POA: Diagnosis not present

## 2021-07-04 DIAGNOSIS — I509 Heart failure, unspecified: Secondary | ICD-10-CM

## 2021-07-04 DIAGNOSIS — Y92009 Unspecified place in unspecified non-institutional (private) residence as the place of occurrence of the external cause: Secondary | ICD-10-CM

## 2021-07-04 DIAGNOSIS — J441 Chronic obstructive pulmonary disease with (acute) exacerbation: Secondary | ICD-10-CM | POA: Diagnosis present

## 2021-07-04 DIAGNOSIS — Z7951 Long term (current) use of inhaled steroids: Secondary | ICD-10-CM

## 2021-07-04 DIAGNOSIS — R778 Other specified abnormalities of plasma proteins: Secondary | ICD-10-CM | POA: Diagnosis not present

## 2021-07-04 DIAGNOSIS — M1990 Unspecified osteoarthritis, unspecified site: Secondary | ICD-10-CM | POA: Diagnosis present

## 2021-07-04 DIAGNOSIS — W06XXXA Fall from bed, initial encounter: Secondary | ICD-10-CM | POA: Diagnosis present

## 2021-07-04 DIAGNOSIS — Z79899 Other long term (current) drug therapy: Secondary | ICD-10-CM

## 2021-07-04 DIAGNOSIS — J449 Chronic obstructive pulmonary disease, unspecified: Secondary | ICD-10-CM | POA: Diagnosis present

## 2021-07-04 DIAGNOSIS — R0602 Shortness of breath: Secondary | ICD-10-CM | POA: Diagnosis present

## 2021-07-04 DIAGNOSIS — Z9981 Dependence on supplemental oxygen: Secondary | ICD-10-CM

## 2021-07-04 DIAGNOSIS — I10 Essential (primary) hypertension: Secondary | ICD-10-CM | POA: Diagnosis not present

## 2021-07-04 DIAGNOSIS — Z880 Allergy status to penicillin: Secondary | ICD-10-CM

## 2021-07-04 DIAGNOSIS — N179 Acute kidney failure, unspecified: Secondary | ICD-10-CM | POA: Diagnosis present

## 2021-07-04 DIAGNOSIS — I252 Old myocardial infarction: Secondary | ICD-10-CM | POA: Diagnosis not present

## 2021-07-04 DIAGNOSIS — E0865 Diabetes mellitus due to underlying condition with hyperglycemia: Secondary | ICD-10-CM

## 2021-07-04 DIAGNOSIS — Z91012 Allergy to eggs: Secondary | ICD-10-CM

## 2021-07-04 DIAGNOSIS — Y92122 Bedroom in nursing home as the place of occurrence of the external cause: Secondary | ICD-10-CM

## 2021-07-04 DIAGNOSIS — Z7984 Long term (current) use of oral hypoglycemic drugs: Secondary | ICD-10-CM

## 2021-07-04 DIAGNOSIS — W19XXXA Unspecified fall, initial encounter: Secondary | ICD-10-CM

## 2021-07-04 LAB — MAGNESIUM: Magnesium: 1.7 mg/dL (ref 1.7–2.4)

## 2021-07-04 LAB — CBC WITH DIFFERENTIAL/PLATELET
Abs Immature Granulocytes: 0.04 10*3/uL (ref 0.00–0.07)
Basophils Absolute: 0 10*3/uL (ref 0.0–0.1)
Basophils Relative: 0 %
Eosinophils Absolute: 0.3 10*3/uL (ref 0.0–0.5)
Eosinophils Relative: 3 %
HCT: 45.5 % (ref 36.0–46.0)
Hemoglobin: 13 g/dL (ref 12.0–15.0)
Immature Granulocytes: 0 %
Lymphocytes Relative: 20 %
Lymphs Abs: 1.9 10*3/uL (ref 0.7–4.0)
MCH: 26.4 pg (ref 26.0–34.0)
MCHC: 28.6 g/dL — ABNORMAL LOW (ref 30.0–36.0)
MCV: 92.3 fL (ref 80.0–100.0)
Monocytes Absolute: 0.8 10*3/uL (ref 0.1–1.0)
Monocytes Relative: 8 %
Neutro Abs: 6.3 10*3/uL (ref 1.7–7.7)
Neutrophils Relative %: 69 %
Platelets: 245 10*3/uL (ref 150–400)
RBC: 4.93 MIL/uL (ref 3.87–5.11)
RDW: 16 % — ABNORMAL HIGH (ref 11.5–15.5)
WBC: 9.3 10*3/uL (ref 4.0–10.5)
nRBC: 0.5 % — ABNORMAL HIGH (ref 0.0–0.2)

## 2021-07-04 LAB — COMPREHENSIVE METABOLIC PANEL
ALT: 56 U/L — ABNORMAL HIGH (ref 0–44)
AST: 48 U/L — ABNORMAL HIGH (ref 15–41)
Albumin: 3.9 g/dL (ref 3.5–5.0)
Alkaline Phosphatase: 158 U/L — ABNORMAL HIGH (ref 38–126)
Anion gap: 9 (ref 5–15)
BUN: 31 mg/dL — ABNORMAL HIGH (ref 8–23)
CO2: 34 mmol/L — ABNORMAL HIGH (ref 22–32)
Calcium: 10.2 mg/dL (ref 8.9–10.3)
Chloride: 102 mmol/L (ref 98–111)
Creatinine, Ser: 1.27 mg/dL — ABNORMAL HIGH (ref 0.44–1.00)
GFR, Estimated: 44 mL/min — ABNORMAL LOW (ref >60)
Glucose, Bld: 141 mg/dL — ABNORMAL HIGH (ref 70–99)
Potassium: 4.1 mmol/L (ref 3.5–5.1)
Sodium: 145 mmol/L (ref 135–145)
Total Bilirubin: 0.5 mg/dL (ref 0.3–1.2)
Total Protein: 7.9 g/dL (ref 6.5–8.1)

## 2021-07-04 LAB — BRAIN NATRIURETIC PEPTIDE: B Natriuretic Peptide: 1054.3 pg/mL — ABNORMAL HIGH (ref 0.0–100.0)

## 2021-07-04 LAB — GLUCOSE, CAPILLARY: Glucose-Capillary: 216 mg/dL — ABNORMAL HIGH (ref 70–99)

## 2021-07-04 LAB — RESP PANEL BY RT-PCR (FLU A&B, COVID) ARPGX2
Influenza A by PCR: NEGATIVE
Influenza B by PCR: NEGATIVE
SARS Coronavirus 2 by RT PCR: NEGATIVE

## 2021-07-04 LAB — TROPONIN I (HIGH SENSITIVITY)
Troponin I (High Sensitivity): 100 ng/L (ref <18)
Troponin I (High Sensitivity): 105 ng/L (ref <18)

## 2021-07-04 LAB — HEMOGLOBIN A1C
Hgb A1c MFr Bld: 6.6 % — ABNORMAL HIGH (ref 4.8–5.6)
Mean Plasma Glucose: 142.72 mg/dL

## 2021-07-04 MED ORDER — FUROSEMIDE 10 MG/ML IJ SOLN
40.0000 mg | Freq: Two times a day (BID) | INTRAMUSCULAR | Status: DC
  Administered 2021-07-04 – 2021-07-06 (×4): 40 mg via INTRAVENOUS
  Filled 2021-07-04 (×4): qty 4

## 2021-07-04 MED ORDER — METHYLPREDNISOLONE SODIUM SUCC 40 MG IJ SOLR
40.0000 mg | Freq: Two times a day (BID) | INTRAMUSCULAR | Status: AC
  Administered 2021-07-04 – 2021-07-05 (×2): 40 mg via INTRAVENOUS
  Filled 2021-07-04 (×2): qty 1

## 2021-07-04 MED ORDER — INSULIN ASPART 100 UNIT/ML IJ SOLN
0.0000 [IU] | Freq: Three times a day (TID) | INTRAMUSCULAR | Status: DC
  Administered 2021-07-05: 5 [IU] via SUBCUTANEOUS
  Administered 2021-07-05: 15 [IU] via SUBCUTANEOUS
  Administered 2021-07-05: 5 [IU] via SUBCUTANEOUS
  Administered 2021-07-06: 8 [IU] via SUBCUTANEOUS
  Administered 2021-07-06: 3 [IU] via SUBCUTANEOUS
  Administered 2021-07-07: 14:00:00 5 [IU] via SUBCUTANEOUS
  Administered 2021-07-07: 10:00:00 2 [IU] via SUBCUTANEOUS
  Administered 2021-07-07: 18:00:00 5 [IU] via SUBCUTANEOUS
  Administered 2021-07-08: 8 [IU] via SUBCUTANEOUS
  Administered 2021-07-08: 15 [IU] via SUBCUTANEOUS
  Filled 2021-07-04: qty 0.15

## 2021-07-04 MED ORDER — ONDANSETRON HCL 4 MG PO TABS: 4.0000 mg | ORAL_TABLET | Freq: Four times a day (QID) | ORAL | Status: DC | PRN

## 2021-07-04 MED ORDER — PREDNISONE 20 MG PO TABS
40.0000 mg | ORAL_TABLET | Freq: Every day | ORAL | Status: DC
  Administered 2021-07-06 – 2021-07-08 (×3): 40 mg via ORAL
  Filled 2021-07-04 (×3): qty 2

## 2021-07-04 MED ORDER — ACETAMINOPHEN 325 MG PO TABS: 650.0000 mg | ORAL_TABLET | Freq: Four times a day (QID) | ORAL | Status: DC | PRN

## 2021-07-04 MED ORDER — INSULIN ASPART 100 UNIT/ML IJ SOLN
0.0000 [IU] | Freq: Every day | INTRAMUSCULAR | Status: DC
  Administered 2021-07-04: 2 [IU] via SUBCUTANEOUS
  Administered 2021-07-05: 3 [IU] via SUBCUTANEOUS
  Administered 2021-07-06: 5 [IU] via SUBCUTANEOUS
  Administered 2021-07-07: 22:00:00 3 [IU] via SUBCUTANEOUS
  Filled 2021-07-04: qty 0.05

## 2021-07-04 MED ORDER — METHYLPREDNISOLONE SODIUM SUCC 125 MG IJ SOLR
125.0000 mg | Freq: Once | INTRAMUSCULAR | Status: AC
  Administered 2021-07-04: 125 mg via INTRAVENOUS
  Filled 2021-07-04: qty 2

## 2021-07-04 MED ORDER — IPRATROPIUM-ALBUTEROL 0.5-2.5 (3) MG/3ML IN SOLN
3.0000 mL | Freq: Four times a day (QID) | RESPIRATORY_TRACT | Status: DC
  Administered 2021-07-04 – 2021-07-05 (×5): 3 mL via RESPIRATORY_TRACT
  Filled 2021-07-04 (×5): qty 3

## 2021-07-04 MED ORDER — ENOXAPARIN SODIUM 60 MG/0.6ML IJ SOSY
50.0000 mg | PREFILLED_SYRINGE | INTRAMUSCULAR | Status: DC
  Administered 2021-07-04 – 2021-07-07 (×4): 50 mg via SUBCUTANEOUS
  Filled 2021-07-04 (×4): qty 0.6

## 2021-07-04 MED ORDER — ONDANSETRON HCL 4 MG/2ML IJ SOLN: 4.0000 mg | Freq: Four times a day (QID) | INTRAMUSCULAR | Status: DC | PRN

## 2021-07-04 MED ORDER — FUROSEMIDE 10 MG/ML IJ SOLN
40.0000 mg | Freq: Once | INTRAMUSCULAR | Status: AC
  Administered 2021-07-04: 40 mg via INTRAVENOUS
  Filled 2021-07-04: qty 4

## 2021-07-04 MED ORDER — ACETAMINOPHEN 650 MG RE SUPP: 650.0000 mg | Freq: Four times a day (QID) | RECTAL | Status: DC | PRN

## 2021-07-04 NOTE — ED Notes (Signed)
Patient transported to CT 

## 2021-07-04 NOTE — ED Triage Notes (Signed)
The patient suffered an unwitnessed fall at [facility] today. She now complains of shortness of breath and believes she had an asthma attack. EMS noted expiratory wheezing in all but the upper right lung field. They also noted her O2 sats were 74% on room air. During albuterol and Atrovent nebulizer treatment, sats increased to 100%. The patient complains of a headache above the eye brows. She isn't on blood thinners.  ? ? ?HX: COPD, CHF ? ?EMS vitals: ?148/92 BP ?155 CBG ?100 HR ?100% O2 sat nebulizer treatment ? ? ?

## 2021-07-04 NOTE — ED Provider Notes (Signed)
[facility]-EMERGENCY DEPT Provider Note   CSN: [id number] Arrival date & time: 07/04/21  1117     History  Chief Complaint  Patient presents with   Shortness of Breath   Fall    Courtney [name] is a 76 y.o. female.  76 year old female with prior medical history as detailed below presents for evaluation.  Patient apparently had a fall from her bed while at [facility].  She was transported to the ED for evaluation of same.  EMS reports that upon their initial evaluation the patient was noted to be on room air and hypoxic.  Patient was reportedly satting approximately 74% on room air.  Patient had diffuse wheezing in all lung fields.  Patient was getting breathing treatments prior to arrival.  Improvement in patient's pulse ox noted upon arrival.  Her PMH is significant for HTN, HFpEF, COPD, T2DM, obesity, hyperlipidemia.  Patient reports that she did strike her head when she fell.  She denies other injury from the fall itself.  She reports that she is bedbound at baseline.  The history is provided by the patient and medical records.  Shortness of Breath Severity:  Moderate Onset quality:  Unable to specify Timing:  Unable to specify Progression:  Unable to specify Chronicity:  Recurrent Fall Associated symptoms include shortness of breath.      Home Medications Prior to Admission medications   Medication Sig Start Date End Date Taking? Authorizing Provider  acetaminophen (TYLENOL) 325 MG tablet Take 650 mg by mouth every 8 (eight) hours as needed (pain).   Yes [provider]  amLODipine (NORVASC) 10 MG tablet Take 1 tablet (10 mg total) by mouth daily. 07/09/18  Yes Dhungel, Nishant, MD  aspirin EC 81 MG EC tablet Take 1 tablet (81 mg total) by mouth daily. 07/09/18  Yes Dhungel, Nishant, MD  atorvastatin (LIPITOR) 80 MG tablet Take 80 mg by mouth at bedtime.   Yes [provider]  Cholecalciferol (VITAMIN D-3) 125 MCG (5000 UT) TABS  Take 5,000 Units by mouth daily.   Yes [provider]  ferrous sulfate 325 (65 FE) MG tablet Take 1 tablet (325 mg total) by mouth 2 (two) times daily with a meal. Patient taking differently: Take 325 mg by mouth daily. 10/21/19  Yes Rhetta Mura, MD  fluticasone (FLONASE) 50 MCG/ACT nasal spray Place 1 spray into both nostrils daily. 09/19/19  Yes [provider]  furosemide (LASIX) 40 MG tablet Take 1 tablet (40 mg total) by mouth daily. Patient taking differently: Take 40 mg by mouth See admin instructions. Take one tablet (40 mg) by mouth every morning and afternoon 02/13/21  Yes Lama, Sarina Ill, MD  glucagon (GLUCAGEN HYPOKIT) 1 MG SOLR injection Inject 1 mg into the muscle daily as needed for low blood sugar.   Yes [provider]  Glycopyrrolate-Formoterol (BEVESPI AEROSPHERE) 9-4.8 MCG/ACT AERO Inhale 2 puffs into the lungs 2 (two) times daily.   Yes [provider]  hydrOXYzine (ATARAX) 25 MG tablet Take 25 mg by mouth 2 (two) times daily. For itching   Yes [provider]  insulin aspart (NOVOLOG FLEXPEN) 100 UNIT/ML FlexPen Inject 0-17 Units into the skin See admin instructions. Inject 0-16 units subcutaneously before meals and at bedtime per sliding scale: CBG 0-150 0 units, 151-200 4 units, 201-250 6 units, 251-300 8 units, 301-350 10 units, 351-400 14 units, 401-450 16 units, >450 contact MD   Yes [provider]  insulin glargine (LANTUS) 100 unit/mL SOPN  Inject 21 Units into the skin 2 (two) times daily.   Yes [provider]  ipratropium-albuterol (DUONEB) 0.5-2.5 (3) MG/3ML SOLN Take 3 mLs by nebulization every 6 (six) hours as needed. Patient taking differently: Take 3 mLs by nebulization 3 (three) times daily. 07/08/18  Yes Dhungel, Nishant, MD  loratadine (CLARITIN) 10 MG tablet Take 10 mg by mouth daily. For itching   Yes [provider]  melatonin 3 MG TABS tablet Take 1 tablet (3 mg total) by mouth at  bedtime. Patient taking differently: Take 6 mg by mouth at bedtime. 10/21/19  Yes Rhetta MuraSamtani, Jai-Gurmukh, MD  metFORMIN (GLUCOPHAGE) 1000 MG tablet Take 1,000 mg by mouth 2 (two) times daily. 12/19/20  Yes [provider]  metoprolol succinate (TOPROL-XL) 25 MG 24 hr tablet Take 25 mg by mouth daily.   Yes [provider]  nitroGLYCERIN (NITROSTAT) 0.4 MG SL tablet Place 0.4 mg under the tongue every 5 (five) minutes as needed for chest pain. 03/06/21  Yes [provider]  Nutritional Supplement LIQD Take 1 each by mouth daily. Health Shake   Yes [provider]  OXYGEN Inhale 2 L into the lungs continuous.   Yes [provider]  pantoprazole (PROTONIX) 40 MG tablet Take 40 mg by mouth 2 (two) times daily.   Yes [provider]  polyethylene glycol (MIRALAX / GLYCOLAX) 17 g packet Take 17 g by mouth daily.   Yes [provider]  saccharomyces boulardii (FLORASTOR) 250 MG capsule Take 250 mg by mouth 2 (two) times daily.   Yes [provider]  senna (SENOKOT) 8.6 MG TABS tablet Take 2 tablets by mouth 2 (two) times daily.   Yes [provider]  sertraline (ZOLOFT) 50 MG tablet Take 50 mg by mouth daily.    Yes [provider]  arformoterol (BROVANA) 15 MCG/2ML NEBU Take 2 mLs (15 mcg total) by nebulization 2 (two) times daily. 10/21/19   Rhetta MuraSamtani, Jai-Gurmukh, MD  budesonide (PULMICORT) 0.25 MG/2ML nebulizer solution Take 2 mLs (0.25 mg total) by nebulization 2 (two) times daily. 10/21/19   Rhetta MuraSamtani, Jai-Gurmukh, MD  memantine (NAMENDA) 5 MG tablet Take 5 mg by mouth at bedtime.    [provider]  tiotropium (SPIRIVA) 18 MCG inhalation capsule Place 18 mcg into inhaler and inhale daily.    [provider]      Allergies    Eggs or egg-derived products and Penicillins    Review of Systems   Review of Systems  Respiratory:  Positive for shortness of breath.   All other systems reviewed and are  negative.  Physical Exam Updated Vital Signs BP (!) 140/128    Pulse (!) 118    Temp 98.3 F (36.8 C) (Oral)    Resp (!) 31    SpO2 100%  Physical Exam Vitals and nursing note reviewed.  Constitutional:      General: She is not in acute distress.    Appearance: Normal appearance. She is well-developed.  HENT:     Head: Normocephalic and atraumatic.  Eyes:     Conjunctiva/sclera: Conjunctivae normal.     Pupils: Pupils are equal, round, and reactive to light.  Cardiovascular:     Rate and Rhythm: Normal rate and regular rhythm.     Heart sounds: Normal heart sounds.  Pulmonary:     Effort: Pulmonary effort is normal. No respiratory distress.     Comments: Diffuse expiratory wheezes noted in all lung fields Abdominal:     General:  There is no distension.     Palpations: Abdomen is soft.     Tenderness: There is no abdominal tenderness.  Musculoskeletal:        General: No deformity. Normal range of motion.     Cervical back: Normal range of motion and neck supple.  Skin:    General: Skin is warm and dry.  Neurological:     General: No focal deficit present.     Mental Status: She is alert and oriented to person, place, and time.    ED Results / Procedures / Treatments   Labs (all labs ordered are listed, but only abnormal results are displayed) Labs Reviewed  BRAIN NATRIURETIC PEPTIDE - Abnormal; Notable for the following components:      Result Value   B Natriuretic Peptide 1,054.3 (*)    All other components within normal limits  COMPREHENSIVE METABOLIC PANEL - Abnormal; Notable for the following components:   CO2 34 (*)    Glucose, Bld 141 (*)    BUN 31 (*)    Creatinine, Ser 1.27 (*)    AST 48 (*)    ALT 56 (*)    Alkaline Phosphatase 158 (*)    GFR, Estimated 44 (*)    All other components within normal limits  CBC WITH DIFFERENTIAL/PLATELET - Abnormal; Notable for the following components:   MCHC 28.6 (*)    RDW 16.0 (*)    nRBC 0.5 (*)    All other  components within normal limits  TROPONIN I (HIGH SENSITIVITY) - Abnormal; Notable for the following components:   Troponin I (High Sensitivity) 100 (*)    All other components within normal limits  TROPONIN I (HIGH SENSITIVITY)    EKG EKG Interpretation  Date/Time:  Monday July 04 2021 11:30:29 EST Ventricular Rate:  104 PR Interval:  194 QRS Duration: 93 QT Interval:  325 QTC Calculation: 428 R Axis:   54 Text Interpretation: Sinus or ectopic atrial tachycardia Ventricular premature complex Nonspecific T abnrm, anterolateral leads Confirmed by Kristine RoyalMessick, Pearlena Ow [phone]) on 07/04/2021 1:14:44 PM  Radiology CT Head Wo Contrast  Result Date: 07/04/2021 CLINICAL DATA:  Head trauma, minor (Age >= 65y); Neck trauma (Age >= 65y) EXAM: CT HEAD WITHOUT CONTRAST CT CERVICAL SPINE WITHOUT CONTRAST TECHNIQUE: Multidetector CT imaging of the head and cervical spine was performed following the standard protocol without intravenous contrast. Multiplanar CT image reconstructions of the cervical spine were also generated. RADIATION DOSE REDUCTION: This exam was performed according to the departmental dose-optimization program which includes automated exposure control, adjustment of the mA and/or kV according to patient size and/or use of iterative reconstruction technique. COMPARISON:  MRI head very 05/20/2004 FINDINGS: CT HEAD FINDINGS Brain: No evidence of acute large vascular territory infarction, hemorrhage, hydrocephalus, extra-axial collection or mass lesion/mass effect. Areas of hypoattenuation within the right greater than left white matter, nonspecific but compatible with chronic microvascular ischemic disease. Ex vacuo ventricular dilation Vascular: Calcific intracranial atherosclerosis. No hyperdense vessel identified Skull: No evidence of acute fracture. Sinuses/Orbits: Minimal paranasal sinus mucosal thickening. Unremarkable orbits. Other: Trace bilateral mastoid effusions CT CERVICAL SPINE FINDINGS  Limited study due to beam hardening from patient body habitus. Within this limitation: Alignment: Straightening.  No substantial sagittal subluxation. Skull base and vertebrae: Vertebral body heights are maintained. No evidence of acute fracture. Soft tissues and spinal canal: Motion limited without evidence of large canal hematoma or obvious prevertebral edema. Disc levels: Multilevel facet and uncovertebral hypertrophy with resulting varying degrees of neural foraminal stenosis. Moderate  multilevel degenerative disc disease, including posterior disc/osteophyte complexes and bridging anterior osteophytes. Craniocervical degenerative change. Upper chest: Biapical pleuroparenchymal scarring with otherwise clear lung apices. IMPRESSION: CT head: 1. No evidence of acute intracranial abnormality. 2. Chronic microvascular ischemic disease. CT cervical spine: 1. Limited study without evidence of acute fracture or traumatic malalignment. 2. Multilevel degenerative change as detailed above. Electronically Signed   By: Feliberto Harts M.D.   On: 07/04/2021 14:12   CT Cervical Spine Wo Contrast  Result Date: 07/04/2021 CLINICAL DATA:  Head trauma, minor (Age >= 65y); Neck trauma (Age >= 65y) EXAM: CT HEAD WITHOUT CONTRAST CT CERVICAL SPINE WITHOUT CONTRAST TECHNIQUE: Multidetector CT imaging of the head and cervical spine was performed following the standard protocol without intravenous contrast. Multiplanar CT image reconstructions of the cervical spine were also generated. RADIATION DOSE REDUCTION: This exam was performed according to the departmental dose-optimization program which includes automated exposure control, adjustment of the mA and/or kV according to patient size and/or use of iterative reconstruction technique. COMPARISON:  MRI head very 05/20/2004 FINDINGS: CT HEAD FINDINGS Brain: No evidence of acute large vascular territory infarction, hemorrhage, hydrocephalus, extra-axial collection or mass  lesion/mass effect. Areas of hypoattenuation within the right greater than left white matter, nonspecific but compatible with chronic microvascular ischemic disease. Ex vacuo ventricular dilation Vascular: Calcific intracranial atherosclerosis. No hyperdense vessel identified Skull: No evidence of acute fracture. Sinuses/Orbits: Minimal paranasal sinus mucosal thickening. Unremarkable orbits. Other: Trace bilateral mastoid effusions CT CERVICAL SPINE FINDINGS Limited study due to beam hardening from patient body habitus. Within this limitation: Alignment: Straightening.  No substantial sagittal subluxation. Skull base and vertebrae: Vertebral body heights are maintained. No evidence of acute fracture. Soft tissues and spinal canal: Motion limited without evidence of large canal hematoma or obvious prevertebral edema. Disc levels: Multilevel facet and uncovertebral hypertrophy with resulting varying degrees of neural foraminal stenosis. Moderate multilevel degenerative disc disease, including posterior disc/osteophyte complexes and bridging anterior osteophytes. Craniocervical degenerative change. Upper chest: Biapical pleuroparenchymal scarring with otherwise clear lung apices. IMPRESSION: CT head: 1. No evidence of acute intracranial abnormality. 2. Chronic microvascular ischemic disease. CT cervical spine: 1. Limited study without evidence of acute fracture or traumatic malalignment. 2. Multilevel degenerative change as detailed above. Electronically Signed   By: Feliberto Harts M.D.   On: 07/04/2021 14:12   DG Chest Port 1 View  Result Date: 07/04/2021 CLINICAL DATA:  Shortness of breath, recent fall EXAM: PORTABLE CHEST 1 VIEW COMPARISON:  Previous studies including the examination of 03/07/2021 FINDINGS: Transverse diameter of heart is increased. Central pulmonary vessels are prominent. There is prominence of interstitial markings in the parahilar regions and lower lung fields. There is interval appearance  of linear density in the left parahilar region and interval clearing of linear density in the right parahilar region. Costophrenic angles are clear. There is no pneumothorax. IMPRESSION: Cardiomegaly. Central pulmonary vessels are prominent suggesting CHF. There is interval clearing of subsegmental atelectasis in the right parahilar region and interval appearance of subsegmental atelectasis in the left parahilar region since 03/07/2021. Electronically Signed   By: Ernie Avena M.D.   On: 07/04/2021 13:12    Procedures Procedures    Medications Ordered in ED Medications  furosemide (LASIX) injection 40 mg (has no administration in time range)  methylPREDNISolone sodium succinate (SOLU-MEDROL) 125 mg/2 mL injection 125 mg (125 mg Intravenous Given 07/04/21 1343)    ED Course/ Medical Decision Making/ A&P  Medical Decision Making Amount and/or Complexity of Data Reviewed Labs: ordered. Radiology: ordered.  Risk Prescription drug management.    Medical Screen Complete  This patient presented to the ED with complaint of fall, shortness of breath, wheezing, hypoxia.  This complaint involves an extensive number of treatment options. The initial differential diagnosis includes, but is not limited to, COPD exacerbation, traumatic injury from fall, CHF, ACS, metabolic abnormality, etc.  This presentation is: Acute, Chronic, Self-Limited, Previously Undiagnosed, Uncertain Prognosis, Complicated, Systemic Symptoms, and Threat to Life/Bodily Function  Patient is presenting with complaint of fall from her bed.  Additionally, patient was noted to be significantly hypoxic with EMS initial evaluation.  Wheezing noted on initial exam.  Improvement in patient's respirations noted with nebulizer treatment.  Administration of Solu-Medrol performed here in the ED.  Initial labs are concerning for elevated BNP and elevated troponin.  Patient without complaint of chest  pain.  EKG is without evidence of acute ischemia.  Patient without evidence of significant traumatic injury from fall.  Patient would benefit from admission for further work-up and treatment.  Hospitalist service is aware case and will evaluate for admission.  Cardiology [facility]) made aware of case and will consult.    Co morbidities that complicated the patient's evaluation  COPD, CHF, diabetes, obesity   Additional history obtained:  Additional history obtained from EMS External records from outside sources obtained and reviewed including prior ED visits and prior Inpatient records.    Lab Tests:  I ordered and personally interpreted labs.  The pertinent results include: CBC, CMP, troponin, BNP   Imaging Studies ordered:  I ordered imaging studies including chest x-ray, CT head, CT C-spine I independently visualized and interpreted obtained imaging which showed pulmonary edema I agree with the radiologist interpretation.   Cardiac Monitoring:  The patient was maintained on a cardiac monitor.  I personally viewed and interpreted the cardiac monitor which showed an underlying rhythm of: Sinus tachycardia   Medicines ordered:  I ordered medication including soluMedrol, Lasix for COPD exacerbation, failure Reevaluation of the patient after these medicines showed that the patient: improved    Problem List / ED Course:  Hypoxia, wheezing, fall   Reevaluation:  After the interventions noted above, I reevaluated the patient and found that they have: improved  Disposition:  After consideration of the diagnostic results and the patients response to treatment, I feel that the patent would benefit from admission.          Final Clinical Impression(s) / ED Diagnoses Final diagnoses:  Fall, initial encounter  COPD exacerbation (HCC)  Dyspnea, unspecified type  Hypoxia    Rx / DC Orders ED Discharge Orders     None         Wynetta Fines, MD 07/04/21 1520

## 2021-07-04 NOTE — H&P (Signed)
History and Physical    Patient: Courtney [name] MWN:[id number] DOB: Aug 30, 1945 DOA: 07/04/2021 DOS: the patient was seen and examined on 07/04/2021 PCP: Mindi Curling, PA-C  Patient coming from: SNF  Chief Complaint:  Chief Complaint  Patient presents with   Shortness of Breath   Fall   HPI: Courtney [name] is a 76 y.o. female with medical history significant of COPD, DM2, HLD, GERD, dementia, chronic HFpEF, HTN. Presenting with dyspnea. She reports that she was lying in bed this morning and rolled over to the side. Her rail wasn't up and she fell out of bed. When the staff found her, she was wheezy and hypoxic. Staff called for EMS and she was transported to the ED. When asking her to recall specifics about the events, she is unable to. She is not sure if she had chest pain or palpitations prior to the fall. She is not aware if she hit or head or not. She is not sure of LOC. She reports that she does remain short of breath, but better than earlier.    Review of Systems: unable to review all systems due to the inability of the patient to answer questions. Past Medical History:  Diagnosis Date   Anemia    Arthritis    Asthma    CAD (coronary artery disease)    COPD (chronic obstructive pulmonary disease) ([place])    Diabetes mellitus    Hyperlipidemia    Hypertension    Myocardial infarct ([place])    Postmenopausal atrophic vaginitis    Restless leg syndrome    Past Surgical History:  Procedure Laterality Date   BIOPSY  02/09/2021   Procedure: BIOPSY;  Surgeon: Sharyn Creamer, MD;  Location: [place];  Service: Gastroenterology;;   CHOLECYSTECTOMY  1970's   ESOPHAGOGASTRODUODENOSCOPY (EGD) WITH PROPOFOL N/A 02/09/2021   Procedure: ESOPHAGOGASTRODUODENOSCOPY (EGD) WITH PROPOFOL;  Surgeon: Sharyn Creamer, MD;  Location: [place];  Service: Gastroenterology;  Laterality: N/A;   INCISION AND DRAINAGE ABSCESS Right 06/27/2018   Procedure: INCISION AND DEBRIDMENT OF PERI-RECTAL  NECROTIZING FASCIITIS.;  Surgeon: Leighton Ruff, MD;  Location: [facility];  Service: General;  Laterality: Right;   INCISION AND DRAINAGE PERIRECTAL ABSCESS N/A 06/25/2018   Procedure: IRRIGATION AND DEBRIDEMENT PERIRECTAL ABCESS;  Surgeon: Leighton Ruff, MD;  Location: [facility];  Service: General;  Laterality: N/A;   IRRIGATION AND DEBRIDEMENT ABSCESS N/A 07/01/2018   Procedure: IRRIGATION AND DEBRIDEMENT ABSCESS;  Surgeon: Rolm Bookbinder, MD;  Location: [facility];  Service: General;  Laterality: N/A;   OTHER SURGICAL HISTORY  1970's   hysterectomy   WOUND DEBRIDEMENT N/A 07/03/2018   Procedure: DEBRIDEMENT PELVIS WITH DRESSING CHANGE;  Surgeon: Rolm Bookbinder, MD;  Location: [facility];  Service: General;  Laterality: N/A;   Social History:  reports that she quit smoking about 3 years ago. Her smoking use included cigarettes. She smoked an average of .5 packs per day. She has never been exposed to tobacco smoke. She has never used smokeless tobacco. She reports that she does not drink alcohol and does not use drugs.  Allergies  Allergen Reactions   Eggs Or Egg-Derived Products Swelling    Listed on [facility] 07/04/21 Patient reports had face swelling.    Penicillins Other (See Comments)    Did it involve swelling of the face/tongue/throat, SOB, or low BP? No Did it involve sudden or severe rash/hives, skin peeling, or any reaction on the inside of your mouth or nose? Yes Did you need to seek medical  attention at a hospital or doctor's office? yes When did it last happen?      more than 10 years If all above answers are "NO", may proceed with cephalosporin use.     Family History  Problem Relation Age of Onset   Hypertension Brother    Heart attack Neg Hx     Prior to Admission medications   Medication Sig Start Date End Date Taking? Authorizing Provider  acetaminophen (TYLENOL) 325 MG tablet Take 650 mg by mouth every 8 (eight) hours as needed (pain).   Yes [provider]  amLODipine  (NORVASC) 10 MG tablet Take 1 tablet (10 mg total) by mouth daily. 07/09/18  Yes Dhungel, Nishant, MD  aspirin EC 81 MG EC tablet Take 1 tablet (81 mg total) by mouth daily. 07/09/18  Yes Dhungel, Nishant, MD  atorvastatin (LIPITOR) 80 MG tablet Take 80 mg by mouth at bedtime.   Yes [provider]  Cholecalciferol (VITAMIN D-3) 125 MCG (5000 UT) TABS Take 5,000 Units by mouth daily.   Yes [provider]  ferrous sulfate 325 (65 FE) MG tablet Take 1 tablet (325 mg total) by mouth 2 (two) times daily with a meal. Patient taking differently: Take 325 mg by mouth daily. 10/21/19  Yes Nita Sells, MD  fluticasone (FLONASE) 50 MCG/ACT nasal spray Place 1 spray into both nostrils daily. 09/19/19  Yes [provider]  furosemide (LASIX) 40 MG tablet Take 1 tablet (40 mg total) by mouth daily. Patient taking differently: Take 40 mg by mouth See admin instructions. Take one tablet (40 mg) by mouth every morning and afternoon 02/13/21  Yes Lama, Marge Duncans, MD  glucagon (GLUCAGEN HYPOKIT) 1 MG SOLR injection Inject 1 mg into the muscle daily as needed for low blood sugar.   Yes [provider]  Glycopyrrolate-Formoterol (BEVESPI AEROSPHERE) 9-4.8 MCG/ACT AERO Inhale 2 puffs into the lungs 2 (two) times daily.   Yes [provider]  hydrOXYzine (ATARAX) 25 MG tablet Take 25 mg by mouth 2 (two) times daily. For itching   Yes [provider]  insulin aspart (NOVOLOG FLEXPEN) 100 UNIT/ML FlexPen Inject 0-17 Units into the skin See admin instructions. Inject 0-16 units subcutaneously before meals and at bedtime per sliding scale: CBG 0-150 0 units, 151-200 4 units, 201-250 6 units, 251-300 8 units, 301-350 10 units, 351-400 14 units, 401-450 16 units, >450 contact MD   Yes [provider]  insulin glargine (LANTUS) 100 unit/mL SOPN Inject 21 Units into the skin 2 (two) times daily.   Yes [provider]  ipratropium-albuterol (DUONEB) 0.5-2.5  (3) MG/3ML SOLN Take 3 mLs by nebulization every 6 (six) hours as needed. Patient taking differently: Take 3 mLs by nebulization 3 (three) times daily. 07/08/18  Yes Dhungel, Nishant, MD  loratadine (CLARITIN) 10 MG tablet Take 10 mg by mouth daily. For itching   Yes [provider]  melatonin 3 MG TABS tablet Take 1 tablet (3 mg total) by mouth at bedtime. Patient taking differently: Take 6 mg by mouth at bedtime. 10/21/19  Yes Nita Sells, MD  metFORMIN (GLUCOPHAGE) 1000 MG tablet Take 1,000 mg by mouth 2 (two) times daily. 12/19/20  Yes [provider]  metoprolol succinate (TOPROL-XL) 25 MG 24 hr tablet Take 25 mg by mouth daily.   Yes [provider]  nitroGLYCERIN (NITROSTAT) 0.4 MG SL tablet Place 0.4 mg under the tongue every 5 (five) minutes as needed for chest pain. 03/06/21  Yes [provider]  Nutritional Supplement LIQD Take 1 each by mouth daily. Health Shake   Yes [provider]  OXYGEN Inhale 2 L into the lungs continuous.   Yes [provider]  pantoprazole (PROTONIX) 40 MG tablet Take 40 mg by mouth 2 (two) times daily.   Yes [provider]  polyethylene glycol (MIRALAX / GLYCOLAX) 17 g packet Take 17 g by mouth daily.   Yes [provider]  saccharomyces boulardii (FLORASTOR) 250 MG capsule Take 250 mg by mouth 2 (two) times daily.   Yes [provider]  senna (SENOKOT) 8.6 MG TABS tablet Take 2 tablets by mouth 2 (two) times daily.   Yes [provider]  sertraline (ZOLOFT) 50 MG tablet Take 50 mg by mouth daily.    Yes [provider]  arformoterol (BROVANA) 15 MCG/2ML NEBU Take 2 mLs (15 mcg total) by nebulization 2 (two) times daily. 10/21/19   Nita Sells, MD  budesonide (PULMICORT) 0.25 MG/2ML nebulizer solution Take 2 mLs (0.25 mg total) by nebulization 2 (two) times daily. 10/21/19   Nita Sells, MD  memantine (NAMENDA) 5 MG tablet Take 5 mg by mouth at  bedtime.    [provider]  tiotropium (SPIRIVA) 18 MCG inhalation capsule Place 18 mcg into inhaler and inhale daily.    [provider]    Physical Exam: Vitals:   07/04/21 1300 07/04/21 1405 07/04/21 1430 07/04/21 1500  BP: (!) 156/86 (!) 154/113 (!) 171/108 (!) 140/128  Pulse: (!) 122 (!) 120 (!) 120 (!) 118  Resp: $Remo'15 19 18 'dJGMt$ (!) 31  Temp:      TempSrc:      SpO2: 100% 97% 100% 100%   General: 76 y.o. female resting in bed in NAD Eyes: PERRL, normal sclera ENMT: Nares patent w/o discharge, orophaynx clear, dentition normal, ears w/o discharge/lesions/ulcers Neck: Supple, trachea midline Cardiovascular: RRR, +S1, S2, no m/g/r, equal pulses throughout Respiratory: CTABL, no w/r/r, normal WOB GI: BS+, NDNT, no masses noted, no organomegaly noted MSK: No c/c; trace b/l pedal edema Neuro: A&O x name/president, no focal deficits Psyc: somewhat confused, calm/cooperative  Data Reviewed:  CO2  34 BUN 31 SCR 1.27 Alk phos 158 AST48 ALT 56 BNP 1054 Trp 100  CXR: Cardiomegaly. Central pulmonary vessels are prominent suggesting CHF. There is interval clearing of subsegmental atelectasis in the right parahilar region and interval appearance of subsegmental atelectasis in the left parahilar region since 03/07/2021.  CTH/c-spine: 1. No evidence of acute intracranial abnormality. 2. Chronic microvascular ischemic disease. 1. Limited study without evidence of acute fracture or traumatic malalignment. 2. Multilevel degenerative change as detailed above.  EKG: sinus tach, no st elevation  Assessment and Plan: No notes have been filed under this hospital service. Service: Hospitalist COPD exacerbation     - admit to inpt, tele     - continue steroids, nebs     - wean O2 as able  Acute on chronic HFpEF Elevated troponin     - lasix     - fluid restriction, I&O, daily weights     - EKG is non-ischemic     - first trp is 100; rpt pending     - denies CP     -  EDP has consulted cards, awaiting final recs  Fall     - PT/OT     - imaging as above  AKI     - baseline is 0.7 - 0.8     - she is hypervolemic     -  check renal US     - watch nephrotoxins  DM2     - SSI, glucose checks, DM diet, A1c  HTN     - resume home regimen when confirmed  HLD     - resume home regimen when confirmed  Dementia     - resume home regimen when confirmed  Elevated LFTs     - mild, check hep panel, follow   Advance Care Planning:   Code Status: FULL  Consults: EDP consulted cardiology  Family Communication: None at bedside  Severity of Illness: The appropriate patient status for this patient is INPATIENT. Inpatient status is judged to be reasonable and necessary in order to provide the required intensity of service to ensure the patient's safety. The patient's presenting symptoms, physical exam findings, and initial radiographic and laboratory data in the context of their chronic comorbidities is felt to place them at high risk for further clinical deterioration. Furthermore, it is not anticipated that the patient will be medically stable for discharge from the hospital within 2 midnights of admission.   * I certify that at the point of admission it is my clinical judgment that the patient will require inpatient hospital care spanning beyond 2 midnights from the point of admission due to high intensity of service, high risk for further deterioration and high frequency of surveillance required.*  Author: Jonnie Finner, DO 07/04/2021 3:28 PM  For on call review [email or web address].

## 2021-07-05 ENCOUNTER — Inpatient Hospital Stay (HOSPITAL_COMMUNITY): Payer: Medicare (Managed Care)

## 2021-07-05 DIAGNOSIS — I5031 Acute diastolic (congestive) heart failure: Secondary | ICD-10-CM | POA: Diagnosis not present

## 2021-07-05 DIAGNOSIS — I5033 Acute on chronic diastolic (congestive) heart failure: Secondary | ICD-10-CM | POA: Diagnosis not present

## 2021-07-05 LAB — GLUCOSE, CAPILLARY
Glucose-Capillary: 247 mg/dL — ABNORMAL HIGH (ref 70–99)
Glucose-Capillary: 249 mg/dL — ABNORMAL HIGH (ref 70–99)
Glucose-Capillary: 359 mg/dL — ABNORMAL HIGH (ref 70–99)
Glucose-Capillary: 300 mg/dL — ABNORMAL HIGH (ref 70–99)

## 2021-07-05 LAB — ECHOCARDIOGRAM COMPLETE
AR max vel: 1.17 cm2
AV Area VTI: 1.35 cm2
AV Area mean vel: 1.12 cm2
AV Mean grad: 7 mmHg
AV Peak grad: 12.4 mmHg
Ao pk vel: 1.76 m/s
Area-P 1/2: 3.42 cm2
Height: 63 in
S' Lateral: 2.2 cm
Single Plane A4C EF: 57.7 %
Weight: 3552.05 [oz_av]

## 2021-07-05 LAB — CBC
HCT: 45.7 % (ref 36.0–46.0)
Hemoglobin: 13 g/dL (ref 12.0–15.0)
MCH: 26.4 pg (ref 26.0–34.0)
MCHC: 28.4 g/dL — ABNORMAL LOW (ref 30.0–36.0)
MCV: 92.7 fL (ref 80.0–100.0)
Platelets: 214 10*3/uL (ref 150–400)
RBC: 4.93 MIL/uL (ref 3.87–5.11)
RDW: 16 % — ABNORMAL HIGH (ref 11.5–15.5)
WBC: 6.6 10*3/uL (ref 4.0–10.5)
nRBC: 0.3 % — ABNORMAL HIGH (ref 0.0–0.2)

## 2021-07-05 LAB — COMPREHENSIVE METABOLIC PANEL
ALT: 51 U/L — ABNORMAL HIGH (ref 0–44)
AST: 30 U/L (ref 15–41)
Albumin: 3.6 g/dL (ref 3.5–5.0)
Alkaline Phosphatase: 142 U/L — ABNORMAL HIGH (ref 38–126)
Anion gap: 11 (ref 5–15)
BUN: 33 mg/dL — ABNORMAL HIGH (ref 8–23)
CO2: 32 mmol/L (ref 22–32)
Calcium: 9.8 mg/dL (ref 8.9–10.3)
Chloride: 98 mmol/L (ref 98–111)
Creatinine, Ser: 1.05 mg/dL — ABNORMAL HIGH (ref 0.44–1.00)
GFR, Estimated: 55 mL/min — ABNORMAL LOW (ref >60)
Glucose, Bld: 294 mg/dL — ABNORMAL HIGH (ref 70–99)
Potassium: 5.4 mmol/L — ABNORMAL HIGH (ref 3.5–5.1)
Sodium: 141 mmol/L (ref 135–145)
Total Bilirubin: 0.6 mg/dL (ref 0.3–1.2)
Total Protein: 7.4 g/dL (ref 6.5–8.1)

## 2021-07-05 LAB — HEPATITIS PANEL, ACUTE
HCV Ab: NONREACTIVE
Hep A IgM: NONREACTIVE
Hep B C IgM: NONREACTIVE
Hepatitis B Surface Ag: NONREACTIVE

## 2021-07-05 LAB — POTASSIUM: Potassium: 4.7 mmol/L (ref 3.5–5.1)

## 2021-07-05 MED ORDER — HYDROXYZINE HCL 25 MG PO TABS
25.0000 mg | ORAL_TABLET | Freq: Two times a day (BID) | ORAL | Status: DC
  Administered 2021-07-05 – 2021-07-08 (×6): 25 mg via ORAL
  Filled 2021-07-05 (×6): qty 1

## 2021-07-05 MED ORDER — INSULIN GLARGINE-YFGN 100 UNIT/ML ~~LOC~~ SOLN
20.0000 [IU] | Freq: Every day | SUBCUTANEOUS | Status: DC
  Administered 2021-07-05 – 2021-07-08 (×4): 20 [IU] via SUBCUTANEOUS
  Filled 2021-07-05 (×4): qty 0.2

## 2021-07-05 MED ORDER — ATORVASTATIN CALCIUM 40 MG PO TABS
80.0000 mg | ORAL_TABLET | Freq: Every day | ORAL | Status: DC
  Administered 2021-07-05 – 2021-07-07 (×3): 80 mg via ORAL
  Filled 2021-07-05 (×3): qty 2

## 2021-07-05 MED ORDER — NITROGLYCERIN 0.4 MG SL SUBL: 0.4000 mg | SUBLINGUAL_TABLET | SUBLINGUAL | Status: DC | PRN

## 2021-07-05 MED ORDER — SERTRALINE HCL 50 MG PO TABS
50.0000 mg | ORAL_TABLET | Freq: Every day | ORAL | Status: DC
  Administered 2021-07-05 – 2021-07-08 (×4): 50 mg via ORAL
  Filled 2021-07-05 (×4): qty 1

## 2021-07-05 MED ORDER — ASPIRIN EC 81 MG PO TBEC
81.0000 mg | DELAYED_RELEASE_TABLET | Freq: Every day | ORAL | Status: DC
  Administered 2021-07-05 – 2021-07-08 (×4): 81 mg via ORAL
  Filled 2021-07-05 (×4): qty 1

## 2021-07-05 MED ORDER — PANTOPRAZOLE SODIUM 40 MG PO TBEC
40.0000 mg | DELAYED_RELEASE_TABLET | Freq: Two times a day (BID) | ORAL | Status: DC
Start: 2021-07-05 — End: 2021-07-08
  Administered 2021-07-05 – 2021-07-08 (×6): 40 mg via ORAL
  Filled 2021-07-05 (×6): qty 1

## 2021-07-05 MED ORDER — AMLODIPINE BESYLATE 10 MG PO TABS
10.0000 mg | ORAL_TABLET | Freq: Every day | ORAL | Status: DC
  Administered 2021-07-05 – 2021-07-08 (×4): 10 mg via ORAL
  Filled 2021-07-05 (×4): qty 1

## 2021-07-05 MED ORDER — FLUTICASONE PROPIONATE 50 MCG/ACT NA SUSP
1.0000 | Freq: Every day | NASAL | Status: DC
  Administered 2021-07-05 – 2021-07-08 (×4): 1 via NASAL
  Filled 2021-07-05: qty 16

## 2021-07-05 MED ORDER — ARFORMOTEROL TARTRATE 15 MCG/2ML IN NEBU
15.0000 ug | INHALATION_SOLUTION | Freq: Two times a day (BID) | RESPIRATORY_TRACT | Status: DC
  Administered 2021-07-05 – 2021-07-08 (×6): 15 ug via RESPIRATORY_TRACT
  Filled 2021-07-05 (×6): qty 2

## 2021-07-05 MED ORDER — METOPROLOL SUCCINATE ER 25 MG PO TB24
25.0000 mg | ORAL_TABLET | Freq: Every day | ORAL | Status: DC
Start: 2021-07-05 — End: 2021-07-08
  Administered 2021-07-05 – 2021-07-08 (×4): 25 mg via ORAL
  Filled 2021-07-05 (×4): qty 1

## 2021-07-05 MED ORDER — PERFLUTREN LIPID MICROSPHERE
1.0000 mL | INTRAVENOUS | Status: AC | PRN
  Administered 2021-07-05: 2 mL via INTRAVENOUS
  Filled 2021-07-05: qty 10

## 2021-07-05 MED ORDER — IPRATROPIUM-ALBUTEROL 0.5-2.5 (3) MG/3ML IN SOLN
3.0000 mL | Freq: Four times a day (QID) | RESPIRATORY_TRACT | Status: DC
  Administered 2021-07-06 (×3): 3 mL via RESPIRATORY_TRACT
  Filled 2021-07-05 (×3): qty 3

## 2021-07-05 MED ORDER — IPRATROPIUM-ALBUTEROL 0.5-2.5 (3) MG/3ML IN SOLN: 3.0000 mL | Freq: Four times a day (QID) | RESPIRATORY_TRACT | Status: DC | PRN

## 2021-07-05 MED ORDER — ARFORMOTEROL TARTRATE 15 MCG/2ML IN NEBU: 15.0000 ug | INHALATION_SOLUTION | Freq: Two times a day (BID) | RESPIRATORY_TRACT | Status: DC

## 2021-07-05 NOTE — Assessment & Plan Note (Addendum)
Continue with prednisone daily, complete 2 more days on d/c ?Continue supplemental oxygen and wean as tolerated. ?Continue scheduled and as needed nebulized bronchodilators. ?Weaned to [facility] ?Remains with wheezing this AM ?

## 2021-07-05 NOTE — Assessment & Plan Note (Addendum)
Likely diastolic CHF.  Presented with shortness of breath, leg edema. ?Had been continued on IV lasix ?Cardiology transitioned to lasix PO BID at time of d/c ?EKG unremarkable.  Echocardiogram LVEF 60 to 65%.  No RWMA ? ?Have prescribed jardiance per Cardiology recs ? ?Intake/Output Summary (Last 24 hours) at 07/08/2021 1234 ?Last data filed at 07/08/2021 0400 ?Gross per 24 hour  ?Intake 240 ml  ?Output 1900 ml  ?Net -1660 ml  ? ?

## 2021-07-05 NOTE — Assessment & Plan Note (Addendum)
Baseline creatinine 0.6-0.8 > creatinine at admission 1.27 ?Renal functions improving with diuresis. ?Avoid nephrotoxic medications,  ? ?

## 2021-07-05 NOTE — Assessment & Plan Note (Addendum)
Hepatitis panel negative.

## 2021-07-05 NOTE — Progress Notes (Signed)
? ?  Echocardiogram ?2D Echocardiogram has been performed. ? ?Courtney [name] ?07/05/2021, 8:44 AM ?

## 2021-07-05 NOTE — Progress Notes (Addendum)
?Progress Note ? ? ?Patient: Courtney [name] [id number] DOB: 02/14/1946 DOA: 07/04/2021     1 ? ?DOS: the patient was seen and examined on 07/05/2021 ?  ?Brief hospital course: ?This 76 years old female with PMH significant for COPD, type 2 diabetes, hyperlipidemia, GERD, dementia, chronic HFp EF, hypertension presented in the ED with complaints of shortness of breath.  Patient reports she was lying in the bed and rolled over to the side her rail was not and she fell out of the bed.  When the staff found her she was having wheezing and was hypoxic.  Her SPO2 was 70% on room air.  Patient was unable to recall that event.  Chest x-ray shows increased pulmonary vascular congestion suggesting CHF.  CT head no evidence of acute intracranial abnormality. ?Patient is admitted for COPD and CHF exacerbation.  Cardiology is consulted. ? ?Assessment and Plan: ?* Acute on chronic heart failure ([place]) ?Likely diastolic CHF.  Presented with shortness of breath, leg edema. ?Continue Lasix 40 mg every 12 hours. ?Monitor daily weight, intake output charting. ?EKG unremarkable.  Echocardiogram LVEF 60 to 65%.  No RWMA ? ?Intake/Output Summary (Last 24 hours) at 07/05/2021 1428 ?Last data filed at 07/05/2021 0900 ?Gross per 24 hour  ?Intake 480 ml  ?Output 1025 ml  ?Net -545 ml  ? ? ?Elevated troponin ?Troponin 100> 105> 105 flat, less likely ACS. ?Likely consistent with demand ischemia in the setting of CHF exacerbation.  Cardiology is consulted. ? ?AKI (acute kidney injury) ([place]) ?Baseline creatinine 0.6-0.8 > creatinine at admission 1.27 ?Renal functions improving with diuresis. ?Avoid nephrotoxic medications,  ?Continue to monitor serum creatinine. ? ?Elevated LFTs ?Hepatitis panel negative. ?Continue monitor liver enzymes. ? ?Dementia without behavioral disturbance ([place]) ?Continue home medications. ? ?Hypertension ?Continue amlodipine and metoprolol. ? ?Diabetes mellitus due to underlying condition, uncontrolled, with hyperglycemia,  with long-term current use of insulin ([place]) ?Start Semglee 20 units daily. ?Continue regular insulin sliding scale. ? ?COPD (chronic obstructive pulmonary disease) ([place]) ?She has significant wheezing noted on exam. ?Continue with prednisone daily. ?Continue supplemental oxygen and wean as tolerated. ?Continue scheduled and as needed nebulized bronchodilators. ? ?HLD (hyperlipidemia) ?Continue atorvastatin. ? ? ? ?Pressure Injury 06/25/18 Stage II -  Partial thickness loss of dermis presenting as a shallow open ulcer with a red, pink wound bed without slough. (Active)  ?06/25/18 2200  ?Location: Groin  ?Location Orientation: Right;Medial  ?Staging: Stage II -  Partial thickness loss of dermis presenting as a shallow open ulcer with a red, pink wound bed without slough.  ?Wound Description (Comments):   ?Present on Admission: Yes  ? ?Subjective: Patient was seen and examined at bedside.  Overnight events noted. ?Patient appears comfortable, not in any acute distress.  She reports much improved. ? ?Physical Exam: ?Vitals:  ? 07/05/21 0500 07/05/21 0757 07/05/21 0800 07/05/21 1200  ?BP:   139/74 (!) 141/82  ?Pulse:   92 81  ?Resp:   16 18  ?Temp:    97.9 ?F (36.6 ?C)  ?TempSrc:    Oral  ?SpO2:  98% 100% 97%  ?Weight: 100.7 kg     ?Height:      ? ?General exam: Appears comfortable, deconditioned, not in any acute distress. ?Respiratory : Wheezing bilaterally, no crackles, respiratory effort normal. ?Cardiovascular : S1-S2 heard, regular rate and rhythm, no murmur. ?Gastrointestinal : Abdomen is soft, nontender, nondistended, BS+ ?Central nervous system: Alert and oriented x2, no focal neurological deficits. ?Extremities: Edema+, no cyanosis, no clubbing. ?Psychiatry: Mood and  insight appropriate. ? ?Data Reviewed: ?I have Reviewed nursing notes, Vitals, and Lab results since pt's last encounter. Pertinent lab results CBC, BMP ?I have ordered test including CBC, BMP ?I have reviewed the last note from cardiology,  ?I have  discussed pt's care plan and test results with patient.  ? ?Family Communication: No family at bedside ? ?Disposition: ?Status is: Inpatient ?Remains inpatient appropriate because: Admitted for CHF and COPD exacerbation. ? ?Planned Discharge Destination: Skilled nursing facility ? ?Time spent: 50 minutes ? ?Author: ?Shawna Clamp, MD ?07/05/2021 2:46 PM ? ?For on call review [email or web address].  ?

## 2021-07-05 NOTE — Progress Notes (Signed)
Inpatient Diabetes Program Recommendations ? ?AACE/ADA: New Consensus Statement on Inpatient Glycemic Control (2015) ? ?Target Ranges:  Prepandial:   less than 140 mg/dL ?     Peak postprandial:   less than 180 mg/dL (1-2 hours) ?     Critically ill patients:  140 - 180 mg/dL  ? ?Lab Results  ?Component Value Date  ? GLUCAP 247 (H) 07/05/2021  ? HGBA1C 6.6 (H) 07/04/2021  ? ? ?Review of Glycemic Control ? Latest Reference Range & Units 07/04/21 22:08 07/05/21 07:52  ?Glucose-Capillary 70 - 99 mg/dL 786 (H) 767 (H)  ?(H): Data is abnormally high ? ?Diabetes history: DM2 ? ?Outpatient Diabetes medications: Novolog 0-17 units TID & lantus 21 units BID ? ?Current orders for Inpatient glycemic control: Novolog 0-15 units TID and 0-5 units QHS, Prednisone 40 QAM ? ?Inpatient Diabetes Program Recommendations:   ? ?Semglee 20 units daily (50% of home dose) ? ?Will continue to follow while inpatient. ? ?Thank you, ?Dulce Sellar, MSN, RN ?Diabetes Coordinator ?Inpatient Diabetes Program ?[phone] (team pager from 8a-5p) ? ? ? ?

## 2021-07-05 NOTE — Progress Notes (Signed)
OT Cancellation Note ? ?Patient Details ?Name: Courtney [name] ?MRN: [id number] ?DOB: April 24, 1946 ? ? ?Cancelled Treatment:    Reason Eval/Treat Not Completed: OT screened, no needs identified, will sign off. Per chart review patient is bed bound at baseline. Per therapy notes ~58yr prior patient is a LTC resident needing total care and signed off. No acute OT needs at this time. ? ?Marlyce Huge OT ?OT pager: [phone] ? ? ?Carmelia Roller ?07/05/2021, 7:49 AM ?

## 2021-07-05 NOTE — Progress Notes (Signed)
PT Cancellation Note ? ?Patient Details ?Name: Courtney [name] ?MRN: [id number] ?DOB: [birth year] ? ? ?Cancelled Treatment:    Reason Eval/Treat Not Completed: PT screened, no needs identified, will sign off, Patient is a LTC resident  of SNF.Marland Kitchen ?Blanchard Kelch PT ?Acute Rehabilitation Services ?Pager [phone] ?Office [phone] ? ? ? ?Rada Hay ?07/05/2021, 7:48 AM ?

## 2021-07-05 NOTE — [place] FL2 (Signed)
?[place] MEDICAID FL2 LEVEL OF CARE SCREENING TOOL  ?  ? ?IDENTIFICATION  ?Patient Name: ?Courtney [name] Birthdate: October 06, 1945 Sex: female Admission Date (Current Location): ?07/04/2021  ?[place] and [place] Number: ? [place] ?[id number] R Facility and Address:  ?[facility],  [address] [place], [place] ?     Provider Number: ?[id number]  ?Attending Physician Name and Address:  ?Shawna Clamp, MD ? Relative Name and Phone Number:  ?Mcquigg,Cornelius W Spouse [phone]    Eshbach,STEPHANIE Daughter   [phone]  [name] [facility] Brother [id number]    Leeroy Cha Sister [place] Niece [phone] ?   ?Current Level of Care: ?Hospital Recommended Level of Care: ?[place] Prior Approval Number: ?  ? ?Date Approved/Denied: ?  PASRR Number: ?[id number] ? ?Discharge Plan: ?SNF ?  ? ?Current Diagnoses: ?Patient Active Problem List  ? Diagnosis Date Noted  ? Acute on chronic heart failure ([place]) 07/04/2021  ? Fall at home, initial encounter 07/04/2021  ? Dementia without behavioral disturbance ([place]) 07/04/2021  ? Elevated LFTs 07/04/2021  ? AKI (acute kidney injury) ([place]) 07/04/2021  ? Elevated troponin 07/04/2021  ? Acute exacerbation of CHF (congestive heart failure) ([place]) 03/08/2021  ? Chest pain 03/07/2021  ? Dysphagia   ? Acute respiratory failure with hypoxia ([place]) 02/04/2021  ? Acute on chronic combined systolic and diastolic CHF (congestive heart failure) ([place]) 02/04/2021  ? Severe sepsis ([place]) 02/04/2021  ? CAP (community acquired pneumonia) 02/04/2021  ? Acute metabolic encephalopathy [id number]  ? DM2 (diabetes mellitus, type 2) ([place])   ? Acute exacerbation of chronic obstructive pulmonary disease (COPD) ([place]) 02/03/2021  ? Acute on chronic respiratory failure with hypoxia ([place]) 10/18/2019  ? COPD with acute exacerbation ([place]) 10/18/2019  ? New onset of congestive heart failure ([place]) 10/18/2019  ? Multifocal pneumonia 10/18/2019  ? Hyperkalemia  10/18/2019  ? Hypertension 07/08/2018  ? Obesity, Class III, BMI 40-49.9 (morbid obesity) ([place]) 07/08/2018  ? Sepsis ([place])   ? Diabetes mellitus due to underlying condition, uncontrolled, with hyperglycemia, with long-term current use of insulin ([place]) 06/28/2018  ? Cigarette smoker 06/28/2018  ? OSA and COPD overlap syndrome ([place]) 06/26/2018  ? Pressure injury of skin 06/26/2018  ? Fournier's gangrene in female 06/25/2018  ? Hypertensive heart disease 08/21/2016  ? Normocytic anemia   ? Old MI (myocardial infarction)   ? HLD (hyperlipidemia)   ? Postmenopausal atrophic vaginitis   ? Restless leg syndrome   ? COPD (chronic obstructive pulmonary disease) ([place])   ? Arthritis   ? CAD (coronary artery disease)   ? Iron (Fe) deficiency anemia 04/05/2011  ? ? ?Orientation RESPIRATION BLADDER Height & Weight   ?  ?Self ? O2 (2L) Incontinent Weight: 222 lb 0.1 oz (100.7 kg) ?Height:  5\' 3"  (160 cm)  ?BEHAVIORAL SYMPTOMS/MOOD NEUROLOGICAL BOWEL NUTRITION STATUS  ?    Incontinent Diet (Carb Modified diet)  ?AMBULATORY STATUS COMMUNICATION OF NEEDS Skin   ?Limited Assist Verbally PU Stage and Appropriate Care ?  ?PU Stage 2 Dressing:  (PRN dressing changes) ?  ?    ?     ?     ? ? ?Personal Care Assistance Level of Assistance  ?Bathing, Feeding, Dressing Bathing Assistance: Limited assistance ?Feeding assistance: Limited assistance ?Dressing Assistance: Limited assistance ?   ? ?Functional Limitations Info  ?Sight, Speech, Hearing Sight Info: Adequate ?Hearing Info: Adequate ?Speech Info: Adequate  ? ? ?SPECIAL CARE FACTORS FREQUENCY  ?    ?  ?  ?  ?  ?  ?  ?   ? ? ?  Contractures Contractures Info: Not present  ? ? ?Additional Factors Info  ?Code Status, Allergies, Insulin Sliding Scale, Psychotropic Code Status Info: Full Code ?Allergies Info: Eggs Or Egg-derived Products   Penicillins ?Psychotropic Info: sertraline (ZOLOFT) tablet 50 mg ?Insulin Sliding Scale Info: insulin aspart (novoLOG) injection 0-15 Units 3x a day with  meals ?  ?   ? ?Current Medications (07/05/2021):  This is the current hospital active medication list ?Current Facility-Administered Medications  ?Medication Dose Route Frequency Provider Last Rate Last Admin  ? acetaminophen (TYLENOL) tablet 650 mg  650 mg Oral Q6H PRN Cherylann Ratel A, DO      ? Or  ? acetaminophen (TYLENOL) suppository 650 mg  650 mg Rectal Q6H PRN Marylyn Ishihara, Tyrone A, DO      ? amLODipine (NORVASC) tablet 10 mg  10 mg Oral Daily Shawna Clamp, MD      ? arformoterol (BROVANA) nebulizer solution 15 mcg  15 mcg Nebulization BID Shawna Clamp, MD      ? aspirin EC tablet 81 mg  81 mg Oral Daily Shawna Clamp, MD      ? atorvastatin (LIPITOR) tablet 80 mg  80 mg Oral QHS Shawna Clamp, MD      ? enoxaparin (LOVENOX) injection 50 mg  50 mg Subcutaneous Q24H Kyle, Tyrone A, DO   50 mg at 07/04/21 2210  ? fluticasone (FLONASE) 50 MCG/ACT nasal spray 1 spray  1 spray Each Nare Daily Shawna Clamp, MD   1 spray at 07/05/21 1530  ? furosemide (LASIX) injection 40 mg  40 mg Intravenous Q12H Kyle, Tyrone A, DO   40 mg at 07/05/21 [id number]  ? hydrOXYzine (ATARAX) tablet 25 mg  25 mg Oral BID Shawna Clamp, MD      ? insulin aspart (novoLOG) injection 0-15 Units  0-15 Units Subcutaneous TID WC Kyle, Tyrone A, DO   5 Units at 07/05/21 1152  ? insulin aspart (novoLOG) injection 0-5 Units  0-5 Units Subcutaneous QHS Kyle, Tyrone A, DO   2 Units at 07/04/21 2234  ? insulin glargine-yfgn (SEMGLEE) injection 20 Units  20 Units Subcutaneous Daily Shawna Clamp, MD   20 Units at 07/05/21 1529  ? ipratropium-albuterol (DUONEB) 0.5-2.5 (3) MG/3ML nebulizer solution 3 mL  3 mL Nebulization Q6H Kyle, Tyrone A, DO   3 mL at 07/05/21 1413  ? ipratropium-albuterol (DUONEB) 0.5-2.5 (3) MG/3ML nebulizer solution 3 mL  3 mL Nebulization Q6H PRN Shawna Clamp, MD      ? metoprolol succinate (TOPROL-XL) 24 hr tablet 25 mg  25 mg Oral Daily Shawna Clamp, MD      ? nitroGLYCERIN (NITROSTAT) SL tablet 0.4 mg  0.4 mg Sublingual Q5 min  PRN Shawna Clamp, MD      ? ondansetron [facility]) tablet 4 mg  4 mg Oral Q6H PRN Cherylann Ratel A, DO      ? Or  ? ondansetron (ZOFRAN) injection 4 mg  4 mg Intravenous Q6H PRN Marylyn Ishihara, Tyrone A, DO      ? pantoprazole (PROTONIX) EC tablet 40 mg  40 mg Oral BID Shawna Clamp, MD      ? Derrill Memo ON 07/06/2021] predniSONE (DELTASONE) tablet 40 mg  40 mg Oral Q breakfast Kyle, Tyrone A, DO      ? sertraline (ZOLOFT) tablet 50 mg  50 mg Oral Daily Shawna Clamp, MD      ? ? ? ?Discharge Medications: ?Please see discharge summary for a list of discharge medications. ? ?Relevant Imaging Results: ? ?Relevant Lab Results: ? ? ?  Additional Information ?SSN [id number] ? ?Ross Ludwig, LCSW ? ? ? ? ?

## 2021-07-05 NOTE — TOC Initial Note (Signed)
Transition of Care (TOC) - Initial/Assessment Note  ? ? ?Patient Details  ?Name: Courtney [name] ?MRN: [id number] ?Date of Birth: 1945/06/18 ? ?Transition of Care (TOC) CM/SW Contact:    ?Darleene Cleaver, LCSW ?Phone Number: ?07/05/2021, 4:02 PM ? ?Clinical Narrative:                 ? ?Patient is a 76 year old female who is a LTC resident at [facility]).  CSW spoke to [place] at [facility] they can accept patient once she is medically ready for discharge.  CSW to complete FL2 and will continue to follow patient's progress throughout discharge planning. ? ?Expected Discharge Plan: Skilled Nursing Facility ?Barriers to Discharge: Continued Medical Work up ? ? ?Patient Goals and CMS Choice ?Patient states their goals for this hospitalization and ongoing recovery are:: To return back to SNF. ?  ?  ? ?Expected Discharge Plan and Services ?Expected Discharge Plan: Skilled Nursing Facility ?  ?  ?  ?  ?                ?  ?  ?  ?  ?  ?  ?  ?  ?  ?  ? ?Prior Living Arrangements/Services ?  ?Lives with:: Facility Resident ?Patient language and need for interpreter reviewed:: Yes ?Do you feel safe going back to the place where you live?: Yes      ?Need for Family Participation in Patient Care: Yes (Comment) ?Care giver support system in place?: Yes (comment) ?  ?Criminal Activity/Legal Involvement Pertinent to Current Situation/Hospitalization: No - Comment as needed ? ?Activities of Daily Living ?Home Assistive Devices/Equipment: Eyeglasses ?ADL Screening (condition at time of admission) ?Patient's cognitive ability adequate to safely complete daily activities?: No ?Is the patient deaf or have difficulty hearing?: Yes ?Does the patient have difficulty seeing, even when wearing glasses/contacts?: No ?Does the patient have difficulty concentrating, remembering, or making decisions?: Yes ?Patient able to express need for assistance with ADLs?: Yes ?Does the patient have difficulty dressing or bathing?:  Yes ?Independently performs ADLs?: No ?Does the patient have difficulty walking or climbing stairs?: Yes ?Weakness of Legs: Both ?Weakness of Arms/Hands: Both ? ?Permission Sought/Granted ?Permission sought to share information with : Facility Medical sales representative, Family Supports ?Permission granted to share information with : Yes, Release of Information Signed ? Share Information with NAME: Jule, Whitsel Spouse [phone]    Esther,STEPHANIE Daughter   [phone]  Lucile Shutters [facility] Brother [phone]    [facility] Sister [phone]    Charmayne Sheer Niece [phone] ? Permission granted to share info w AGENCY: SNF admissions ?   ?   ? ?Emotional Assessment ?Appearance:: Appears stated age ?  ?Affect (typically observed): Calm, Accepting ?Orientation: : Oriented to Self ?Alcohol / Substance Use: Not Applicable ?Psych Involvement: No (comment) ? ?Admission diagnosis:  Hypoxia [R09.02] ?COPD exacerbation (HCC) [J44.1] ?Acute on chronic heart failure (HCC) [I50.9] ?Fall, initial encounter [W19.XXXA] ?Dyspnea, unspecified type [R06.00] ?Patient Active Problem List  ? Diagnosis Date Noted  ? Acute on chronic heart failure (HCC) 07/04/2021  ? Fall at home, initial encounter 07/04/2021  ? Dementia without behavioral disturbance (HCC) 07/04/2021  ? Elevated LFTs 07/04/2021  ? AKI (acute kidney injury) (HCC) 07/04/2021  ? Elevated troponin 07/04/2021  ? Acute exacerbation of CHF (congestive heart failure) (HCC) 03/08/2021  ? Chest pain 03/07/2021  ? Dysphagia   ? Acute respiratory failure with hypoxia (HCC) 02/04/2021  ? Acute on chronic diastolic CHF (congestive heart failure) (HCC) 02/04/2021  ?  Severe sepsis (HCC) 02/04/2021  ? CAP (community acquired pneumonia) 02/04/2021  ? Acute metabolic encephalopathy 02/04/2021  ? DM2 (diabetes mellitus, type 2) (HCC)   ? Acute exacerbation of chronic obstructive pulmonary disease (COPD) (HCC) 02/03/2021  ? Acute on chronic respiratory failure with hypoxia (HCC)  10/18/2019  ? COPD with acute exacerbation (HCC) 10/18/2019  ? New onset of congestive heart failure (HCC) 10/18/2019  ? Multifocal pneumonia 10/18/2019  ? Hyperkalemia 10/18/2019  ? Hypertension 07/08/2018  ? Obesity, Class III, BMI 40-49.9 (morbid obesity) (HCC) 07/08/2018  ? Sepsis (HCC)   ? Diabetes mellitus due to underlying condition, uncontrolled, with hyperglycemia, with long-term current use of insulin (HCC) 06/28/2018  ? Cigarette smoker 06/28/2018  ? OSA and COPD overlap syndrome (HCC) 06/26/2018  ? Pressure injury of skin 06/26/2018  ? Fournier's gangrene in female 06/25/2018  ? Hypertensive heart disease 08/21/2016  ? Normocytic anemia   ? Old MI (myocardial infarction)   ? HLD (hyperlipidemia)   ? Postmenopausal atrophic vaginitis   ? Restless leg syndrome   ? COPD (chronic obstructive pulmonary disease) (HCC)   ? Arthritis   ? CAD (coronary artery disease)   ? Iron (Fe) deficiency anemia 04/05/2011  ? ?PCP:  Jordan Hawks, PA-C ?Pharmacy:   ?[facility] - [place], [place] - [address] ?[address] ?[place] [address] ?Phone: [phone] Fax: [phone] ? ?[facility] - [place], [place] - [address] ?[address] ?[address] ?[place] [address] ?Phone: [phone] Fax: [phone] ? ? ? ? ?Social Determinants of Health (SDOH) Interventions ?  ? ?Readmission Risk Interventions ?No flowsheet data found. ? ? ?

## 2021-07-05 NOTE — Consult Note (Addendum)
Cardiology Consultation:   Patient ID: JENEAN ESCANDON MRN: [id number]; DOB: 11-05-1945  Admit date: 07/04/2021 Date of Consult: 07/05/2021  PCP:  Jordan Hawks, PA-C   [facility] Providers Cardiologist:  None      Dr Eldridge Dace saw 2018  Patient Profile:   PAITEN BOIES is a 76 y.o. female with a hx of inf STEMI 2011 s/p DES RCA, HFpEF, COPD on home O2 2 L, DM2, HTN, HLD, obesity, dementia, who is being seen 07/05/2021 for the evaluation of elevated troponin and CHF exacerbation at the request of Dr. Lucianne Muss.  History of Present Illness:   Ms. Spindler was last seen by Dr. Eldridge Dace in 2018 and was doing well.  She was admitted 11/7-11/12/2020 for acute on chronic diastolic CHF and chest pain.  Discharge weight 107 kg, 235 pounds, enzymes negative for MI and Myoview low risk.  DC on Lasix 40 mg daily among other meds.  She lives at [facility].  She was admitted 3/06 after falling out of bed.  She was wheezing and hypoxic with O2 sats of 74% on room air.  Symptoms were improved by increased oxygen and nebulizers.  She is not sure if she had chest pain.  Her first troponin was 100>> 105, BNP was 1054, cardiology was asked to evaluate her.  Weight 222 pounds.  Ms. Flock answered a couple of questions well, but her mental status is variable and she was not able to participate in the exam.  She asked about her husband and gave me her address (where she used to live).  She denies shortness of breath or chest pain.  She knows she fell out of bed, says she was just trying to move her leg over and knocked her out of bed.  Apparently, the rail had been left down.  She does not know if she has gained any weight.  She feels her breathing is at baseline.  No other information is available from the patient due to her condition.    Past Medical History:  Diagnosis Date   Anemia    Arthritis    Asthma    CAD (coronary artery disease)    COPD (chronic obstructive pulmonary disease) (HCC)     Diabetes mellitus    Hyperlipidemia    Hypertension    Myocardial infarct (HCC)    Postmenopausal atrophic vaginitis    Restless leg syndrome     Past Surgical History:  Procedure Laterality Date   BIOPSY  02/09/2021   Procedure: BIOPSY;  Surgeon: Imogene Burn, MD;  Location: [facility] ENDOSCOPY;  Service: Gastroenterology;;   CHOLECYSTECTOMY  1970's   ESOPHAGOGASTRODUODENOSCOPY (EGD) WITH PROPOFOL N/A 02/09/2021   Procedure: ESOPHAGOGASTRODUODENOSCOPY (EGD) WITH PROPOFOL;  Surgeon: Imogene Burn, MD;  Location: [facility] ENDOSCOPY;  Service: Gastroenterology;  Laterality: N/A;   INCISION AND DRAINAGE ABSCESS Right 06/27/2018   Procedure: 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.;  Surgeon: Romie Levee, MD;  Location: [facility] ORS;  Service: General;  Laterality: Right;   INCISION AND DRAINAGE PERIRECTAL ABSCESS N/A 06/25/2018   Procedure: IRRIGATION AND DEBRIDEMENT PERIRECTAL ABCESS;  Surgeon: Romie Levee, MD;  Location: [facility] ORS;  Service: General;  Laterality: N/A;   IRRIGATION AND DEBRIDEMENT ABSCESS N/A 07/01/2018   Procedure: IRRIGATION AND DEBRIDEMENT ABSCESS;  Surgeon: Emelia Loron, MD;  Location: [facility] ORS;  Service: General;  Laterality: N/A;   OTHER SURGICAL HISTORY  1970's   hysterectomy   WOUND DEBRIDEMENT N/A 07/03/2018   Procedure: DEBRIDEMENT PELVIS WITH DRESSING CHANGE;  Surgeon: Emelia LoronWakefield, Matthew, MD;  Location: [facility] ORS;  Service: General;  Laterality: N/A;     Home Medications:  Prior to Admission medications   Medication Sig Start Date End Date Taking? Authorizing Provider  acetaminophen (TYLENOL) 325 MG tablet Take 650 mg by mouth every 8 (eight) hours as needed (pain).   Yes [provider]  amLODipine (NORVASC) 10 MG tablet Take 1 tablet (10 mg total) by mouth daily. 07/09/18  Yes Dhungel, Nishant, MD  aspirin EC 81 MG EC tablet Take 1 tablet (81 mg total) by mouth daily. 07/09/18  Yes Dhungel, Nishant, MD  atorvastatin (LIPITOR) 80 MG tablet Take 80  mg by mouth at bedtime.   Yes [provider]  Cholecalciferol (VITAMIN D-3) 125 MCG (5000 UT) TABS Take 5,000 Units by mouth daily.   Yes [provider]  ferrous sulfate 325 (65 FE) MG tablet Take 1 tablet (325 mg total) by mouth 2 (two) times daily with a meal. Patient taking differently: Take 325 mg by mouth daily. 10/21/19  Yes Rhetta MuraSamtani, Jai-Gurmukh, MD  fluticasone (FLONASE) 50 MCG/ACT nasal spray Place 1 spray into both nostrils daily. 09/19/19  Yes [provider]  furosemide (LASIX) 40 MG tablet Take 1 tablet (40 mg total) by mouth daily. Patient taking differently: Take 40 mg by mouth See admin instructions. Take one tablet (40 mg) by mouth every morning and afternoon 02/13/21  Yes Lama, Sarina IllGagan S, MD  glucagon (GLUCAGEN HYPOKIT) 1 MG SOLR injection Inject 1 mg into the muscle daily as needed for low blood sugar.   Yes [provider]  Glycopyrrolate-Formoterol (BEVESPI AEROSPHERE) 9-4.8 MCG/ACT AERO Inhale 2 puffs into the lungs 2 (two) times daily.   Yes [provider]  hydrOXYzine (ATARAX) 25 MG tablet Take 25 mg by mouth 2 (two) times daily. For itching   Yes [provider]  insulin aspart (NOVOLOG FLEXPEN) 100 UNIT/ML FlexPen Inject 0-17 Units into the skin See admin instructions. Inject 0-16 units subcutaneously before meals and at bedtime per sliding scale: CBG 0-150 0 units, 151-200 4 units, 201-250 6 units, 251-300 8 units, 301-350 10 units, 351-400 14 units, 401-450 16 units, >450 contact MD   Yes [provider]  insulin glargine (LANTUS) 100 unit/mL SOPN Inject 21 Units into the skin 2 (two) times daily.   Yes [provider]  ipratropium-albuterol (DUONEB) 0.5-2.5 (3) MG/3ML SOLN Take 3 mLs by nebulization every 6 (six) hours as needed. Patient taking differently: Take 3 mLs by nebulization 3 (three) times daily. 07/08/18  Yes Dhungel, Nishant, MD  loratadine (CLARITIN) 10 MG tablet Take 10 mg by mouth daily. For  itching   Yes [provider]  melatonin 3 MG TABS tablet Take 1 tablet (3 mg total) by mouth at bedtime. Patient taking differently: Take 6 mg by mouth at bedtime. 10/21/19  Yes Rhetta MuraSamtani, Jai-Gurmukh, MD  metFORMIN (GLUCOPHAGE) 1000 MG tablet Take 1,000 mg by mouth 2 (two) times daily. 12/19/20  Yes [provider]  metoprolol succinate (TOPROL-XL) 25 MG 24 hr tablet Take 25 mg by mouth daily.   Yes [provider]  nitroGLYCERIN (NITROSTAT) 0.4 MG SL tablet Place 0.4 mg under the tongue every 5 (five) minutes as needed for chest pain. 03/06/21  Yes [provider]  Nutritional Supplement LIQD Take 1 each by mouth daily. Health Shake   Yes [provider]  OXYGEN Inhale 2 L into the lungs continuous.   Yes [provider]  pantoprazole (PROTONIX) 40 MG tablet  Take 40 mg by mouth 2 (two) times daily.   Yes [provider]  polyethylene glycol (MIRALAX / GLYCOLAX) 17 g packet Take 17 g by mouth daily.   Yes [provider]  saccharomyces boulardii (FLORASTOR) 250 MG capsule Take 250 mg by mouth 2 (two) times daily.   Yes [provider]  senna (SENOKOT) 8.6 MG TABS tablet Take 2 tablets by mouth 2 (two) times daily.   Yes [provider]  sertraline (ZOLOFT) 50 MG tablet Take 50 mg by mouth daily.    Yes [provider]  arformoterol (BROVANA) 15 MCG/2ML NEBU Take 2 mLs (15 mcg total) by nebulization 2 (two) times daily. Patient not taking: Reported on 07/05/2021 10/21/19   Rhetta MuraSamtani, Jai-Gurmukh, MD  budesonide (PULMICORT) 0.25 MG/2ML nebulizer solution Take 0.25 mg by nebulization 2 (two) times daily. Patient not taking: Reported on 07/05/2021    [provider]  memantine (NAMENDA) 5 MG tablet Take 5 mg by mouth at bedtime. Patient not taking: Reported on 07/05/2021    [provider]    Inpatient Medications: Scheduled Meds:  enoxaparin (LOVENOX) injection  50 mg Subcutaneous Q24H    furosemide  40 mg Intravenous Q12H   insulin aspart  0-15 Units Subcutaneous TID WC   insulin aspart  0-5 Units Subcutaneous QHS   ipratropium-albuterol  3 mL Nebulization Q6H   methylPREDNISolone (SOLU-MEDROL) injection  40 mg Intravenous Q12H   Followed by   Melene Muller[START ON 07/06/2021] predniSONE  40 mg Oral Q breakfast   Continuous Infusions:  PRN Meds: acetaminophen **OR** acetaminophen, ondansetron **OR** ondansetron (ZOFRAN) IV, perflutren lipid microspheres (DEFINITY) IV suspension  Allergies:    Allergies  Allergen Reactions   Eggs Or Egg-Derived Products Swelling    [facility] Patient reports had face swelling.    Penicillins Other (See Comments)    Did it involve swelling of the face/tongue/throat, SOB, or low BP? No Did it involve sudden or severe rash/hives, skin peeling, or any reaction on the inside of your mouth or nose? Yes Did you need to seek medical attention at a hospital or doctor's office? yes When did it last happen?      more than 10 years If all above answers are "NO", may proceed with cephalosporin use.     Social History:   Social History   Socioeconomic History   Marital status: Married    Spouse name: Not on file   Number of children: Not on file   Years of education: Not on file   Highest education level: Not on file  Occupational History   Not on file  Tobacco Use   Smoking status: Former    Packs/day: 0.50    Types: Cigarettes    Quit date: 05/01/2018    Years since quitting: 3.1    Passive exposure: Never   Smokeless tobacco: Never  Substance and Sexual Activity   Alcohol use: No   Drug use: No   Sexual activity: Not on file  Other Topics Concern   Not on file  Social History Narrative   Not on file   Social Determinants of Health   Financial Resource Strain: Not on file  Food Insecurity: Not on file  Transportation Needs: Not on file  Physical Activity: Not on file  Stress: Not on file  Social Connections: Not on file   Intimate Partner Violence: Not on file    Family History:   Family History  Problem Relation Age of Onset  Hypertension Brother    Heart attack Neg Hx      ROS:  Please see the history of present illness.  No other information is available to the patient condition.     Physical Exam/Data:   Vitals:   07/05/21 0200 07/05/21 0500 07/05/21 0757 07/05/21 0800  BP: 137/73   139/74  Pulse: 85   92  Resp: 19   16  Temp: 98 F (36.7 C)     TempSrc:      SpO2: 96%  98% 100%  Weight:  100.7 kg    Height:        Intake/Output Summary (Last 24 hours) at 07/05/2021 0917 Last data filed at 07/05/2021 0200 Gross per 24 hour  Intake --  Output 1025 ml  Net -1025 ml   Last 3 Weights 07/05/2021 07/04/2021 03/08/2021  Weight (lbs) 222 lb 0.1 oz 221 lb 12.5 oz 235 lb 14.3 oz  Weight (kg) 100.7 kg 100.6 kg 107 kg     Body mass index is 39.33 kg/m.  General:  Well nourished, well developed, obese female in no acute distress HEENT: normal for age Neck: JVD not able to be determined due to patient movement and body habitus Vascular: No carotid bruits; Distal pulses 2+ bilaterally Cardiac:  normal S1, S2; RRR; no murmur  Lungs: Wheezing and some rales bilaterally, no rhonchi Abd: soft, nontender, no hepatomegaly  Ext: no edema Musculoskeletal:  No deformities, BUE and BLE strength not able to be tested Skin: warm and dry, chronic appearing growth on patient's right antecubital area Neuro:  CNs 2-12 intact, no focal abnormalities noted Psych:  Normal affect   EKG:  The EKG was personally reviewed and demonstrates: Sinus rhythm/sinus tachycardia, first-degree AV block, heart rate 104 Telemetry:  Telemetry was personally reviewed and demonstrates: Sinus rhythm, PVCs  Relevant CV Studies:  ECHO: Scheduled  MYOVIEW: 03/07/2021   The study is normal. The study is low risk.   No ST deviation was noted. Arrhythmias during stress: frequent PVCs. Arrhythmias during recovery: frequent PVCs.   LV  perfusion is normal. There is no evidence of ischemia. There is no evidence of infarction.   Left ventricular function is normal. Nuclear stress EF: 57 %. The left ventricular ejection fraction is normal (55-65%). End diastolic cavity size is normal.    This result has not been signed. Information might be incomplete.    CARDIAC cath: 02/11/2010 LAD-mild luminal irregularities, wraps around the apex CFX, OM1, OM2-mild luminal irregularities L-R collaterals RCA-occluded in the midportion with large thrombus burden, S/p 2.25 x 16 mm Ion DES  Laboratory Data:  High Sensitivity Troponin:   Recent Labs  Lab 07/04/21 1201 07/04/21 1623  TROPONINIHS 100* 105*     Chemistry Recent Labs  Lab 07/04/21 1201 07/04/21 2315 07/05/21 0357  NA 145  --  141  K 4.1  --  5.4*  CL 102  --  98  CO2 34*  --  32  GLUCOSE 141*  --  294*  BUN 31*  --  33*  CREATININE 1.27*  --  1.05*  CALCIUM 10.2  --  9.8  MG  --  1.7  --   GFRNONAA 44*  --  55*  ANIONGAP 9  --  11    Recent Labs  Lab 07/04/21 1201 07/05/21 0357  PROT 7.9 7.4  ALBUMIN 3.9 3.6  AST 48* 30  ALT 56* 51*  ALKPHOS 158* 142*  BILITOT 0.5 0.6   Lipids No results for input(s):  CHOL, TRIG, HDL, LABVLDL, LDLCALC, CHOLHDL in the last 168 hours.  Hematology Recent Labs  Lab 07/04/21 1201 07/05/21 0357  WBC 9.3 6.6  RBC 4.93 4.93  HGB 13.0 13.0  HCT 45.5 45.7  MCV 92.3 92.7  MCH 26.4 26.4  MCHC 28.6* 28.4*  RDW 16.0* 16.0*  PLT 245 214   Thyroid No results for input(s): TSH, FREET4 in the last 168 hours.  BNP Recent Labs  Lab 07/04/21 1201  BNP 1,054.3*    DDimer No results for input(s): DDIMER in the last 168 hours.   Radiology/Studies:  CT Head Wo Contrast  Result Date: 07/04/2021 CLINICAL DATA:  Head trauma, minor (Age >= 65y); Neck trauma (Age >= 65y) EXAM: CT HEAD WITHOUT CONTRAST CT CERVICAL SPINE WITHOUT CONTRAST TECHNIQUE: Multidetector CT imaging of the head and cervical spine was performed following  the standard protocol without intravenous contrast. Multiplanar CT image reconstructions of the cervical spine were also generated. RADIATION DOSE REDUCTION: This exam was performed according to the departmental dose-optimization program which includes automated exposure control, adjustment of the mA and/or kV according to patient size and/or use of iterative reconstruction technique. COMPARISON:  MRI head very 05/20/2004 FINDINGS: CT HEAD FINDINGS Brain: No evidence of acute large vascular territory infarction, hemorrhage, hydrocephalus, extra-axial collection or mass lesion/mass effect. Areas of hypoattenuation within the right greater than left white matter, nonspecific but compatible with chronic microvascular ischemic disease. Ex vacuo ventricular dilation Vascular: Calcific intracranial atherosclerosis. No hyperdense vessel identified Skull: No evidence of acute fracture. Sinuses/Orbits: Minimal paranasal sinus mucosal thickening. Unremarkable orbits. Other: Trace bilateral mastoid effusions CT CERVICAL SPINE FINDINGS Limited study due to beam hardening from patient body habitus. Within this limitation: Alignment: Straightening.  No substantial sagittal subluxation. Skull base and vertebrae: Vertebral body heights are maintained. No evidence of acute fracture. Soft tissues and spinal canal: Motion limited without evidence of large canal hematoma or obvious prevertebral edema. Disc levels: Multilevel facet and uncovertebral hypertrophy with resulting varying degrees of neural foraminal stenosis. Moderate multilevel degenerative disc disease, including posterior disc/osteophyte complexes and bridging anterior osteophytes. Craniocervical degenerative change. Upper chest: Biapical pleuroparenchymal scarring with otherwise clear lung apices. IMPRESSION: CT head: 1. No evidence of acute intracranial abnormality. 2. Chronic microvascular ischemic disease. CT cervical spine: 1. Limited study without evidence of acute  fracture or traumatic malalignment. 2. Multilevel degenerative change as detailed above. Electronically Signed   By: Feliberto Harts M.D.   On: 07/04/2021 14:12   CT Cervical Spine Wo Contrast  Result Date: 07/04/2021 CLINICAL DATA:  Head trauma, minor (Age >= 65y); Neck trauma (Age >= 65y) EXAM: CT HEAD WITHOUT CONTRAST CT CERVICAL SPINE WITHOUT CONTRAST TECHNIQUE: Multidetector CT imaging of the head and cervical spine was performed following the standard protocol without intravenous contrast. Multiplanar CT image reconstructions of the cervical spine were also generated. RADIATION DOSE REDUCTION: This exam was performed according to the departmental dose-optimization program which includes automated exposure control, adjustment of the mA and/or kV according to patient size and/or use of iterative reconstruction technique. COMPARISON:  MRI head very 05/20/2004 FINDINGS: CT HEAD FINDINGS Brain: No evidence of acute large vascular territory infarction, hemorrhage, hydrocephalus, extra-axial collection or mass lesion/mass effect. Areas of hypoattenuation within the right greater than left white matter, nonspecific but compatible with chronic microvascular ischemic disease. Ex vacuo ventricular dilation Vascular: Calcific intracranial atherosclerosis. No hyperdense vessel identified Skull: No evidence of acute fracture. Sinuses/Orbits: Minimal paranasal sinus mucosal thickening. Unremarkable orbits. Other: Trace bilateral mastoid effusions CT CERVICAL SPINE  FINDINGS Limited study due to beam hardening from patient body habitus. Within this limitation: Alignment: Straightening.  No substantial sagittal subluxation. Skull base and vertebrae: Vertebral body heights are maintained. No evidence of acute fracture. Soft tissues and spinal canal: Motion limited without evidence of large canal hematoma or obvious prevertebral edema. Disc levels: Multilevel facet and uncovertebral hypertrophy with resulting varying degrees  of neural foraminal stenosis. Moderate multilevel degenerative disc disease, including posterior disc/osteophyte complexes and bridging anterior osteophytes. Craniocervical degenerative change. Upper chest: Biapical pleuroparenchymal scarring with otherwise clear lung apices. IMPRESSION: CT head: 1. No evidence of acute intracranial abnormality. 2. Chronic microvascular ischemic disease. CT cervical spine: 1. Limited study without evidence of acute fracture or traumatic malalignment. 2. Multilevel degenerative change as detailed above. Electronically Signed   By: Feliberto Harts M.D.   On: 07/04/2021 14:12   DG Chest Port 1 View  Result Date: 07/04/2021 CLINICAL DATA:  Shortness of breath, recent fall EXAM: PORTABLE CHEST 1 VIEW COMPARISON:  Previous studies including the examination of 03/07/2021 FINDINGS: Transverse diameter of heart is increased. Central pulmonary vessels are prominent. There is prominence of interstitial markings in the parahilar regions and lower lung fields. There is interval appearance of linear density in the left parahilar region and interval clearing of linear density in the right parahilar region. Costophrenic angles are clear. There is no pneumothorax. IMPRESSION: Cardiomegaly. Central pulmonary vessels are prominent suggesting CHF. There is interval clearing of subsegmental atelectasis in the right parahilar region and interval appearance of subsegmental atelectasis in the left parahilar region since 03/07/2021. Electronically Signed   By: Ernie Avena M.D.   On: 07/04/2021 13:12     Assessment and Plan:   Acute on chronic diastolic CHF -She is on Lasix 40 mg IV every 12, continue this for now - K+ was 5.4 this a.m., but she has received no potassium supplementation, recheck pending, hemolysis not mentioned -Initial O2 sats were 74% on room air, but significant wheezing noted - Currently, she is on 3 L with O2 sats 100% - BUN increased slightly and creatinine went  down 1.27>> 1.05 with diuresis, continue to follow -1 L urine output noted yesterday, but none is recorded for the first 12 hours -Continue to follow I's/O's, daily weights, daily BMET  2.  Elevated troponin -Troponin is mildly elevated at 100, but no significant change on the second troponin - Levels are more consistent with demand ischemia secondary to CHF exacerbation than ACS. - Recent Myoview was low risk - No reports of chest pain - Follow-up on echo, but at this time no ischemic evaluation is planned  3.  COPD exacerbation -She has been put on nebs IV and now p.o., and DuoNeb nebulizers. - She still has some wheezing, but her oxygenation is much improved - Per IM   Risk Assessment/Risk Scores:        New York Heart Association (NYHA) Functional Class NYHA Class IV    For questions or updates, please contact [facility] Please consult [email or web address] for contact info under    Signed, Theodore Demark, PA-C  07/05/2021 9:17 AM  P atient seen and examined.  I agree with findings as noted above by Theodore Demark.  Patient has a history of CAD (status post STEMI in 2011 with DES to the RCA), diastolic CHF, COPD (on home O2), type 2 diabetes, hyperlipidemia, hypertension.  She was admitted on 3 6.  She had fallen out of bed that morning she was wheezing and hypoxic with O2 sats  in the 70s on room air.  Initial blood work BNP was 1054.  Troponins were elevated at 101 05.   On exam, patient appears in no acute distress. Lungs: C mild wheezing.  Mild Rales Rales at bases.  ardiac exam regular rate and rhythm.  S1, S2.  No murmurs. Extremities show 1+ edema in the feet only.  1.  Acute on chronic diastolic CHF.  Agree with Lasix.  So far net -3 L.  Watch renal function.  2.  Elevated troponin.  Very trivial elevation.  Again in setting of hypoxia and CAD not unexpected.  Trends are relatively flat.  No recent Myoview low risk.  Would not pursue further.  3.  COPD  exacerbation.  Patient being treated with nebulizers.  Still with some wheezing.  Again follow his diuresis.  The patient has responded significantly since admit.  She should not be in the hospital long.  We will make sure she has follow-up after.  Dietrich Pates MD

## 2021-07-05 NOTE — Assessment & Plan Note (Signed)
Continue atorvastatin

## 2021-07-05 NOTE — Assessment & Plan Note (Addendum)
Continue long-acting insulin  ?Continue insulin sliding scale as needed ?Cardiology has recommended SGLT2. Will d/c home metformin and prescribe jardiance on d/c ?

## 2021-07-05 NOTE — Assessment & Plan Note (Signed)
-   Continue home medications 

## 2021-07-05 NOTE — Hospital Course (Signed)
This 76 years old female with PMH significant for COPD, type 2 diabetes, hyperlipidemia, GERD, dementia, chronic HFp EF, hypertension presented in the ED with complaints of shortness of breath.  Patient reports she was lying in the bed and rolled over to the side her rail was not and she fell out of the bed.  When the staff found her she was having wheezing and was hypoxic.  Her SPO2 was 70% on room air.  Patient was unable to recall that event.  Chest x-ray shows increased pulmonary vascular congestion suggesting CHF.  CT head no evidence of acute intracranial abnormality. ?Patient is admitted for COPD and CHF exacerbation.  Cardiology is consulted. ?

## 2021-07-05 NOTE — Assessment & Plan Note (Addendum)
Troponin 100> 105> 105 flat, less likely ACS. ?Likely consistent with demand ischemia in the setting of CHF exacerbation.  Cardiology consulted ?Per Cardiology, no plans for ischemic work up ?

## 2021-07-05 NOTE — Plan of Care (Signed)
  Problem: Activity: Goal: Capacity to carry out activities will improve Outcome: Progressing   Problem: Cardiac: Goal: Ability to achieve and maintain adequate cardiopulmonary perfusion will improve Outcome: Progressing   Problem: Education: Goal: Knowledge of disease or condition will improve Outcome: Progressing   

## 2021-07-05 NOTE — Assessment & Plan Note (Signed)
Continue amlodipine and metoprolol 

## 2021-07-06 DIAGNOSIS — R7989 Other specified abnormal findings of blood chemistry: Secondary | ICD-10-CM

## 2021-07-06 DIAGNOSIS — I5043 Acute on chronic combined systolic (congestive) and diastolic (congestive) heart failure: Secondary | ICD-10-CM

## 2021-07-06 DIAGNOSIS — I5023 Acute on chronic systolic (congestive) heart failure: Secondary | ICD-10-CM

## 2021-07-06 DIAGNOSIS — J441 Chronic obstructive pulmonary disease with (acute) exacerbation: Secondary | ICD-10-CM | POA: Diagnosis not present

## 2021-07-06 DIAGNOSIS — I10 Essential (primary) hypertension: Secondary | ICD-10-CM

## 2021-07-06 DIAGNOSIS — N179 Acute kidney failure, unspecified: Secondary | ICD-10-CM

## 2021-07-06 DIAGNOSIS — E785 Hyperlipidemia, unspecified: Secondary | ICD-10-CM

## 2021-07-06 LAB — BASIC METABOLIC PANEL
Anion gap: 8 (ref 5–15)
BUN: 39 mg/dL — ABNORMAL HIGH (ref 8–23)
CO2: 34 mmol/L — ABNORMAL HIGH (ref 22–32)
Calcium: 9.7 mg/dL (ref 8.9–10.3)
Chloride: 98 mmol/L (ref 98–111)
Creatinine, Ser: 1.03 mg/dL — ABNORMAL HIGH (ref 0.44–1.00)
GFR, Estimated: 56 mL/min — ABNORMAL LOW (ref >60)
Glucose, Bld: 111 mg/dL — ABNORMAL HIGH (ref 70–99)
Potassium: 3.8 mmol/L (ref 3.5–5.1)
Sodium: 140 mmol/L (ref 135–145)

## 2021-07-06 LAB — CBC
HCT: 40.9 % (ref 36.0–46.0)
Hemoglobin: 11.7 g/dL — ABNORMAL LOW (ref 12.0–15.0)
MCH: 26.1 pg (ref 26.0–34.0)
MCHC: 28.6 g/dL — ABNORMAL LOW (ref 30.0–36.0)
MCV: 91.1 fL (ref 80.0–100.0)
Platelets: 250 10*3/uL (ref 150–400)
RBC: 4.49 MIL/uL (ref 3.87–5.11)
RDW: 15.9 % — ABNORMAL HIGH (ref 11.5–15.5)
WBC: 10.5 10*3/uL (ref 4.0–10.5)
nRBC: 0.4 % — ABNORMAL HIGH (ref 0.0–0.2)

## 2021-07-06 LAB — GLUCOSE, CAPILLARY
Glucose-Capillary: 92 mg/dL (ref 70–99)
Glucose-Capillary: 179 mg/dL — ABNORMAL HIGH (ref 70–99)
Glucose-Capillary: 257 mg/dL — ABNORMAL HIGH (ref 70–99)
Glucose-Capillary: 422 mg/dL — ABNORMAL HIGH (ref 70–99)

## 2021-07-06 LAB — PHOSPHORUS: Phosphorus: 2.5 mg/dL (ref 2.5–4.6)

## 2021-07-06 LAB — MAGNESIUM: Magnesium: 1.5 mg/dL — ABNORMAL LOW (ref 1.7–2.4)

## 2021-07-06 MED ORDER — FUROSEMIDE 10 MG/ML IJ SOLN: 60.0000 mg | Freq: Two times a day (BID) | INTRAMUSCULAR | Status: DC

## 2021-07-06 MED ORDER — IPRATROPIUM-ALBUTEROL 0.5-2.5 (3) MG/3ML IN SOLN
3.0000 mL | Freq: Three times a day (TID) | RESPIRATORY_TRACT | Status: DC
  Administered 2021-07-07 – 2021-07-08 (×5): 3 mL via RESPIRATORY_TRACT
  Filled 2021-07-06 (×5): qty 3

## 2021-07-06 MED ORDER — POTASSIUM CHLORIDE CRYS ER 20 MEQ PO TBCR
40.0000 meq | EXTENDED_RELEASE_TABLET | Freq: Once | ORAL | Status: AC
  Administered 2021-07-06: 40 meq via ORAL
  Filled 2021-07-06: qty 2

## 2021-07-06 MED ORDER — MAGNESIUM SULFATE 4 GM/100ML IV SOLN
4.0000 g | Freq: Once | INTRAVENOUS | Status: AC
  Administered 2021-07-06: 4 g via INTRAVENOUS
  Filled 2021-07-06: qty 100

## 2021-07-06 MED ORDER — INSULIN ASPART 100 UNIT/ML IJ SOLN
1.0000 [IU] | Freq: Once | INTRAMUSCULAR | Status: AC
  Administered 2021-07-06: 1 [IU] via SUBCUTANEOUS

## 2021-07-06 MED ORDER — FUROSEMIDE 10 MG/ML IJ SOLN
80.0000 mg | Freq: Two times a day (BID) | INTRAMUSCULAR | Status: DC
  Administered 2021-07-06 – 2021-07-07 (×3): 80 mg via INTRAVENOUS
  Filled 2021-07-06 (×4): qty 8

## 2021-07-06 NOTE — Progress Notes (Signed)
?  Progress Note ? ? ?Patient: Courtney [name]:[id number] DOB: [birth year] DOA: 07/04/2021     2 ?DOS: the patient was seen and examined on 07/06/2021 ?  ?Brief hospital course: ?This 76 years old female with PMH significant for COPD, type 2 diabetes, hyperlipidemia, GERD, dementia, chronic HFp EF, hypertension presented in the ED with complaints of shortness of breath.  Patient reports she was lying in the bed and rolled over to the side her rail was not and she fell out of the bed.  When the staff found her she was having wheezing and was hypoxic.  Her SPO2 was 70% on room air.  Patient was unable to recall that event.  Chest x-ray shows increased pulmonary vascular congestion suggesting CHF.  CT head no evidence of acute intracranial abnormality. ?Patient is admitted for COPD and CHF exacerbation.  Cardiology is consulted. ? ?Assessment and Plan: ?* Acute on chronic heart failure (HCC) ?Likely diastolic CHF.  Presented with shortness of breath, leg edema. ?Continued on IV lasix, dose increased to 60mg  bid per Cardiology ?Monitor daily weight, intake output charting. ?EKG unremarkable.  Echocardiogram LVEF 60 to 65%.  No RWMA ? ?Intake/Output Summary (Last 24 hours) at 07/06/2021 1413 ?Last data filed at 07/06/2021 0600 ?Gross per 24 hour  ?Intake --  ?Output 1550 ml  ?Net -1550 ml  ? ? ?Elevated troponin ?Troponin 100> 105> 105 flat, less likely ACS. ?Likely consistent with demand ischemia in the setting of CHF exacerbation.  Cardiology is consulted. ?Per Cardiology, no plans for ischemic work up ? ?AKI (acute kidney injury) (HCC) ?Baseline creatinine 0.6-0.8 > creatinine at admission 1.27 ?Renal functions improving with diuresis. ?Avoid nephrotoxic medications,  ?Continue to monitor serum creatinine. ?Cr 1.03 ? ?Elevated LFTs ?Hepatitis panel negative. ?Continue monitor liver enzymes. ? ?Dementia without behavioral disturbance (HCC) ?Continue home medications. ? ?Hypertension ?Continue amlodipine and  metoprolol. ? ?Diabetes mellitus due to underlying condition, uncontrolled, with hyperglycemia, with long-term current use of insulin (HCC) ?Start Semglee 20 units daily. ?Continue regular insulin sliding scale. ? ?COPD (chronic obstructive pulmonary disease) (HCC) ?Initially noted to have significant wheezing noted on exam. ?Continue with prednisone daily. ?Continue supplemental oxygen and wean as tolerated. ?Continue scheduled and as needed nebulized bronchodilators. ?Currently on 3LNC ? ?HLD (hyperlipidemia) ?Continue atorvastatin. ? ? ?  ? ?Subjective: Without complaints this AM ? ?Physical Exam: ?Vitals:  ? 07/06/21 0400 07/06/21 0500 07/06/21 0600 07/06/21 0812  ?BP: 129/88     ?Pulse: 72 71 78   ?Resp: 20 20 16    ?Temp:   97.9 ?F (36.6 ?C)   ?TempSrc:   Oral   ?SpO2: 97% 96% 95% 93%  ?Weight:  100.7 kg    ?Height:      ? ?General exam: Awake, laying in bed, in nad ?Respiratory system: Normal respiratory effort, no wheezing ?Cardiovascular system: regular rate, s1, s2 ?Gastrointestinal system: Soft, nondistended, positive BS ?Central nervous system: CN2-12 grossly intact, strength intact ?Extremities: Perfused, no clubbing, BLE edema ?Skin: Normal skin turgor, no notable skin lesions seen ?Psychiatry: Mood normal // no visual hallucinations  ? ?Data Reviewed: ? ?Labs reviewed. Cr 1.03 ? ?Family Communication: Pt in room, family not at bedside ? ?Disposition: ?Status is: Inpatient ?Remains inpatient appropriate because: Severity of illness ? Planned Discharge Destination: Skilled nursing facility ? ? ? ?Author: ?09/05/21, MD ?07/06/2021 2:19 PM ? ?For on call review [email or web address].  ?

## 2021-07-06 NOTE — Progress Notes (Signed)
Progress Note  Patient Name: Courtney [name] Date of Encounter: 07/06/2021  [facility] Cardiologist: New (Dr. Harrington Challenger)  Subjective   No acute overnight events. She continues to have shortness of breath and feels like she needs a breathing treatment this morning. She notes some improvement from admission but not back to baseline. Some audible wheezing noted. Patient is currently on 3L via nasal cannula right now (on O2 as needed at home). No chest pain or palpitations.  Inpatient Medications    Scheduled Meds:  amLODipine  10 mg Oral Daily   arformoterol  15 mcg Nebulization BID   aspirin EC  81 mg Oral Daily   atorvastatin  80 mg Oral QHS   enoxaparin (LOVENOX) injection  50 mg Subcutaneous Q24H   fluticasone  1 spray Each Nare Daily   furosemide  40 mg Intravenous Q12H   hydrOXYzine  25 mg Oral BID   insulin aspart  0-15 Units Subcutaneous TID WC   insulin aspart  0-5 Units Subcutaneous QHS   insulin glargine-yfgn  20 Units Subcutaneous Daily   ipratropium-albuterol  3 mL Nebulization Q6H WA   metoprolol succinate  25 mg Oral Daily   pantoprazole  40 mg Oral BID   predniSONE  40 mg Oral Q breakfast   sertraline  50 mg Oral Daily   Continuous Infusions:  magnesium sulfate bolus IVPB     PRN Meds: acetaminophen **OR** acetaminophen, ipratropium-albuterol, nitroGLYCERIN, ondansetron **OR** ondansetron (ZOFRAN) IV   Vital Signs    Vitals:   07/06/21 0400 07/06/21 0500 07/06/21 0600 07/06/21 0812  BP: 129/88     Pulse: 72 71 78   Resp: 20 20 16    Temp:   97.9 F (36.6 C)   TempSrc:   Oral   SpO2: 97% 96% 95% 93%  Weight:  100.7 kg    Height:        Intake/Output Summary (Last 24 hours) at 07/06/2021 0950 Last data filed at 07/06/2021 0600 Gross per 24 hour  Intake --  Output 1550 ml  Net -1550 ml   Last 3 Weights 07/06/2021 07/05/2021 07/04/2021  Weight (lbs) 222 lb 0.1 oz 222 lb 0.1 oz 221 lb 12.5 oz  Weight (kg) 100.7 kg 100.7 kg 100.6 kg      Telemetry     Normal sinus rhythm with PVCs. Rates in the 70s to 75s. - Personally Reviewed  ECG    No new ECG tracing today. - Personally Reviewed  Physical Exam   GEN: African-American female in no acute distress. On 3L of O2 via nasal cannula. Neck: JVD difficult to assess due to body habitus. Cardiac: RRR. Very soft murmur noted at left upper sternal border. No rubs or gallops.  Respiratory: Mild increased work of breathing. Decreased breath sounds in bases with some wheezing and crackles noted. Audible wheezing also noted. GI: Soft, non-distended, and non-tender. MS: Trace lower extremity edema bilaterally. No deformity. Skin: Warm and dry. Neuro:  No focal deficits.  Psych: Normal affect. Responds appropriately.  Labs    High Sensitivity Troponin:   Recent Labs  Lab 07/04/21 1201 07/04/21 1623  TROPONINIHS 100* 105*     Chemistry Recent Labs  Lab 07/04/21 1201 07/04/21 2315 07/05/21 0357 07/05/21 1245 07/06/21 0344  NA 145  --  141  --  140  K 4.1  --  5.4* 4.7 3.8  CL 102  --  98  --  98  CO2 34*  --  32  --  34*  GLUCOSE 141*  --  294*  --  111*  BUN 31*  --  33*  --  39*  CREATININE 1.27*  --  1.05*  --  1.03*  CALCIUM 10.2  --  9.8  --  9.7  MG  --  1.7  --   --  1.5*  PROT 7.9  --  7.4  --   --   ALBUMIN 3.9  --  3.6  --   --   AST 48*  --  30  --   --   ALT 56*  --  51*  --   --   ALKPHOS 158*  --  142*  --   --   BILITOT 0.5  --  0.6  --   --   GFRNONAA 44*  --  55*  --  56*  ANIONGAP 9  --  11  --  8    Lipids No results for input(s): CHOL, TRIG, HDL, LABVLDL, LDLCALC, CHOLHDL in the last 168 hours.  Hematology Recent Labs  Lab 07/04/21 1201 07/05/21 0357 07/06/21 0344  WBC 9.3 6.6 10.5  RBC 4.93 4.93 4.49  HGB 13.0 13.0 11.7*  HCT 45.5 45.7 40.9  MCV 92.3 92.7 91.1  MCH 26.4 26.4 26.1  MCHC 28.6* 28.4* 28.6*  RDW 16.0* 16.0* 15.9*  PLT 245 214 250   Thyroid No results for input(s): TSH, FREET4 in the last 168 hours.  BNP Recent Labs  Lab  07/04/21 1201  BNP 1,054.3*    DDimer No results for input(s): DDIMER in the last 168 hours.   Radiology    CT Head Wo Contrast  Result Date: 07/04/2021 CLINICAL DATA:  Head trauma, minor (Age >= 65y); Neck trauma (Age >= 65y) EXAM: CT HEAD WITHOUT CONTRAST CT CERVICAL SPINE WITHOUT CONTRAST TECHNIQUE: Multidetector CT imaging of the head and cervical spine was performed following the standard protocol without intravenous contrast. Multiplanar CT image reconstructions of the cervical spine were also generated. RADIATION DOSE REDUCTION: This exam was performed according to the departmental dose-optimization program which includes automated exposure control, adjustment of the mA and/or kV according to patient size and/or use of iterative reconstruction technique. COMPARISON:  MRI head very 05/20/2004 FINDINGS: CT HEAD FINDINGS Brain: No evidence of acute large vascular territory infarction, hemorrhage, hydrocephalus, extra-axial collection or mass lesion/mass effect. Areas of hypoattenuation within the right greater than left white matter, nonspecific but compatible with chronic microvascular ischemic disease. Ex vacuo ventricular dilation Vascular: Calcific intracranial atherosclerosis. No hyperdense vessel identified Skull: No evidence of acute fracture. Sinuses/Orbits: Minimal paranasal sinus mucosal thickening. Unremarkable orbits. Other: Trace bilateral mastoid effusions CT CERVICAL SPINE FINDINGS Limited study due to beam hardening from patient body habitus. Within this limitation: Alignment: Straightening.  No substantial sagittal subluxation. Skull base and vertebrae: Vertebral body heights are maintained. No evidence of acute fracture. Soft tissues and spinal canal: Motion limited without evidence of large canal hematoma or obvious prevertebral edema. Disc levels: Multilevel facet and uncovertebral hypertrophy with resulting varying degrees of neural foraminal stenosis. Moderate multilevel  degenerative disc disease, including posterior disc/osteophyte complexes and bridging anterior osteophytes. Craniocervical degenerative change. Upper chest: Biapical pleuroparenchymal scarring with otherwise clear lung apices. IMPRESSION: CT head: 1. No evidence of acute intracranial abnormality. 2. Chronic microvascular ischemic disease. CT cervical spine: 1. Limited study without evidence of acute fracture or traumatic malalignment. 2. Multilevel degenerative change as detailed above. Electronically Signed   By: Feliberto Harts M.D.   On: 07/04/2021 14:12  CT Cervical Spine Wo Contrast  Result Date: 07/04/2021 CLINICAL DATA:  Head trauma, minor (Age >= 65y); Neck trauma (Age >= 65y) EXAM: CT HEAD WITHOUT CONTRAST CT CERVICAL SPINE WITHOUT CONTRAST TECHNIQUE: Multidetector CT imaging of the head and cervical spine was performed following the standard protocol without intravenous contrast. Multiplanar CT image reconstructions of the cervical spine were also generated. RADIATION DOSE REDUCTION: This exam was performed according to the departmental dose-optimization program which includes automated exposure control, adjustment of the mA and/or kV according to patient size and/or use of iterative reconstruction technique. COMPARISON:  MRI head very 05/20/2004 FINDINGS: CT HEAD FINDINGS Brain: No evidence of acute large vascular territory infarction, hemorrhage, hydrocephalus, extra-axial collection or mass lesion/mass effect. Areas of hypoattenuation within the right greater than left white matter, nonspecific but compatible with chronic microvascular ischemic disease. Ex vacuo ventricular dilation Vascular: Calcific intracranial atherosclerosis. No hyperdense vessel identified Skull: No evidence of acute fracture. Sinuses/Orbits: Minimal paranasal sinus mucosal thickening. Unremarkable orbits. Other: Trace bilateral mastoid effusions CT CERVICAL SPINE FINDINGS Limited study due to beam hardening from patient  body habitus. Within this limitation: Alignment: Straightening.  No substantial sagittal subluxation. Skull base and vertebrae: Vertebral body heights are maintained. No evidence of acute fracture. Soft tissues and spinal canal: Motion limited without evidence of large canal hematoma or obvious prevertebral edema. Disc levels: Multilevel facet and uncovertebral hypertrophy with resulting varying degrees of neural foraminal stenosis. Moderate multilevel degenerative disc disease, including posterior disc/osteophyte complexes and bridging anterior osteophytes. Craniocervical degenerative change. Upper chest: Biapical pleuroparenchymal scarring with otherwise clear lung apices. IMPRESSION: CT head: 1. No evidence of acute intracranial abnormality. 2. Chronic microvascular ischemic disease. CT cervical spine: 1. Limited study without evidence of acute fracture or traumatic malalignment. 2. Multilevel degenerative change as detailed above. Electronically Signed   By: Margaretha Sheffield M.D.   On: 07/04/2021 14:12   DG Chest Port 1 View  Result Date: 07/04/2021 CLINICAL DATA:  Shortness of breath, recent fall EXAM: PORTABLE CHEST 1 VIEW COMPARISON:  Previous studies including the examination of 03/07/2021 FINDINGS: Transverse diameter of heart is increased. Central pulmonary vessels are prominent. There is prominence of interstitial markings in the parahilar regions and lower lung fields. There is interval appearance of linear density in the left parahilar region and interval clearing of linear density in the right parahilar region. Costophrenic angles are clear. There is no pneumothorax. IMPRESSION: Cardiomegaly. Central pulmonary vessels are prominent suggesting CHF. There is interval clearing of subsegmental atelectasis in the right parahilar region and interval appearance of subsegmental atelectasis in the left parahilar region since 03/07/2021. Electronically Signed   By: Elmer Picker M.D.   On: 07/04/2021  13:12   ECHOCARDIOGRAM COMPLETE  Result Date: 07/05/2021    ECHOCARDIOGRAM REPORT   Patient Name:   Courtney [name] Date of Exam: 07/05/2021 Medical Rec #:  [id number]       Height:       63.0 in Accession #:    [id number]      Weight:       222.0 lb Date of Birth:  [birth year]        BSA:          2.021 m Patient Age:    23 years        BP:           139/74 mmHg Patient Gender: F               HR:  91 bpm. Exam Location:  Inpatient Procedure: 2D Echo, Cardiac Doppler, Color Doppler and Intracardiac            Opacification Agent Indications:    I50.31 CHF  History:        Patient has prior history of Echocardiogram examinations, most                 recent 02/04/2021. CAD and Previous Myocardial Infarction, COPD;                 Risk Factors:Diabetes, Dyslipidemia and Hypertension.  Sonographer:    Beryle Beams Referring Phys: [id number] [place]  1. Left ventricular ejection fraction, by estimation, is 65 to 70%. The left ventricle has normal function. The left ventricle has no regional wall motion abnormalities. There is mild left ventricular hypertrophy. Left ventricular diastolic parameters are consistent with Grade I diastolic dysfunction (impaired relaxation).  2. Right ventricular systolic function is low normal. The right ventricular size is mildly enlarged. There is moderately elevated pulmonary artery systolic pressure. The estimated right ventricular systolic pressure is [id number] mmHg.  3. The mitral valve is grossly normal. Trivial mitral valve regurgitation.  4. The aortic valve is tricuspid. Aortic valve regurgitation is not visualized. Aortic valve sclerosis/calcification is present, without any evidence of aortic stenosis.  5. The inferior vena cava is dilated in size with >50% respiratory variability, suggesting right atrial pressure of 8 mmHg. Comparison(s): Changes from prior study are noted. 02/04/2021: LVEF 60-65%, severe LAE. FINDINGS  Left Ventricle: Left ventricular ejection  fraction, by estimation, is 65 to 70%. The left ventricle has normal function. The left ventricle has no regional wall motion abnormalities. The left ventricular internal cavity size was normal in size. There is  mild left ventricular hypertrophy. Left ventricular diastolic parameters are consistent with Grade I diastolic dysfunction (impaired relaxation). Indeterminate filling pressures. Right Ventricle: The right ventricular size is mildly enlarged. No increase in right ventricular wall thickness. Right ventricular systolic function is low normal. There is moderately elevated pulmonary artery systolic pressure. The tricuspid regurgitant  velocity is 3.27 m/s, and with an assumed right atrial pressure of 8 mmHg, the estimated right ventricular systolic pressure is [id number] mmHg. Left Atrium: Left atrial size was normal in size. Right Atrium: Right atrial size was normal in size. Pericardium: There is no evidence of pericardial effusion. Mitral Valve: The mitral valve is grossly normal. Trivial mitral valve regurgitation. Tricuspid Valve: The tricuspid valve is grossly normal. Tricuspid valve regurgitation is mild. Aortic Valve: The aortic valve is tricuspid. Aortic valve regurgitation is not visualized. Aortic valve sclerosis/calcification is present, without any evidence of aortic stenosis. Aortic valve mean gradient measures 7.0 mmHg. Aortic valve peak gradient measures 12.4 mmHg. Aortic valve area, by VTI measures 1.35 cm. Pulmonic Valve: The pulmonic valve was grossly normal. Pulmonic valve regurgitation is mild. Aorta: The aortic root and ascending aorta are structurally normal, with no evidence of dilitation. Venous: The inferior vena cava is dilated in size with greater than 50% respiratory variability, suggesting right atrial pressure of 8 mmHg. IAS/Shunts: No atrial level shunt detected by color flow Doppler.  LEFT VENTRICLE PLAX 2D LVIDd:         4.10 cm     Diastology LVIDs:         2.20 cm     LV e'  medial:    5.22 cm/s LV PW:         1.10 cm     LV E/e' medial:  11.5 LV IVS:        1.30 cm     LV e' lateral:   11.00 cm/s LVOT diam:     2.00 cm     LV E/e' lateral: 5.4 LV SV:         47 LV SV Index:   23 LVOT Area:     3.14 cm  LV Volumes (MOD) LV vol d, MOD A4C: 55.6 ml LV vol s, MOD A4C: 23.5 ml LV SV MOD A4C:     55.6 ml RIGHT VENTRICLE             IVC RV Basal diam:  4.10 cm     IVC diam: 2.60 cm RV Mid diam:    3.00 cm RV S prime:     10.60 cm/s TAPSE (M-mode): 2.1 cm LEFT ATRIUM             Index        RIGHT ATRIUM           Index LA diam:        3.20 cm 1.58 cm/m   RA Area:     19.00 cm LA Vol (A2C):   25.4 ml 12.57 ml/m  RA Volume:   53.10 ml  26.27 ml/m LA Vol (A4C):   17.6 ml 8.71 ml/m LA Biplane Vol: 21.6 ml 10.69 ml/m  AORTIC VALVE                     PULMONIC VALVE AV Area (Vmax):    1.17 cm      PV Vmax:       0.50 m/s AV Area (Vmean):   1.12 cm      PV Vmean:      30.800 cm/s AV Area (VTI):     1.35 cm      PV VTI:        0.096 m AV Vmax:           176.00 cm/s   PV Peak grad:  1.0 mmHg AV Vmean:          124.000 cm/s  PV Mean grad:  0.0 mmHg AV VTI:            0.352 m AV Peak Grad:      12.4 mmHg AV Mean Grad:      7.0 mmHg LVOT Vmax:         65.50 cm/s LVOT Vmean:        44.200 cm/s LVOT VTI:          0.151 m LVOT/AV VTI ratio: 0.43  AORTA Ao Root diam: 2.50 cm Ao Asc diam:  2.70 cm MITRAL VALVE                TRICUSPID VALVE MV Area (PHT): 3.42 cm     TR Peak grad:   42.8 mmHg MV Decel Time: 222 msec     TR Mean grad:   27.0 mmHg MV E velocity: 59.90 cm/s   TR Vmax:        327.00 cm/s MV A velocity: 106.00 cm/s  TR Vmean:       250.0 cm/s MV E/A ratio:  0.57                             SHUNTS  Systemic VTI:  0.15 m                             Systemic Diam: 2.00 cm Lyman Bishop MD Electronically signed by Lyman Bishop MD Signature Date/Time: 07/05/2021/10:43:56 AM    Final     Cardiac Studies   Myoview 03/07/2021:   The study is normal. The study is  low risk.   No ST deviation was noted. Arrhythmias during stress: frequent PVCs. Arrhythmias during recovery: frequent PVCs.   LV perfusion is normal. There is no evidence of ischemia. There is no evidence of infarction.   Left ventricular function is normal. Nuclear stress EF: 57 %. The left ventricular ejection fraction is normal (55-65%). End diastolic cavity size is normal.    This result has not been signed. Information might be incomplete.   _______________  Echocardiogram 07/05/2021: Impressions: 1. Left ventricular ejection fraction, by estimation, is 65 to 70%. The  left ventricle has normal function. The left ventricle has no regional  wall motion abnormalities. There is mild left ventricular hypertrophy.  Left ventricular diastolic parameters  are consistent with Grade I diastolic dysfunction (impaired relaxation).   2. Right ventricular systolic function is low normal. The right  ventricular size is mildly enlarged. There is moderately elevated  pulmonary artery systolic pressure. The estimated right ventricular  systolic pressure is [id number] mmHg.   3. The mitral valve is grossly normal. Trivial mitral valve  regurgitation.   4. The aortic valve is tricuspid. Aortic valve regurgitation is not  visualized. Aortic valve sclerosis/calcification is present, without any  evidence of aortic stenosis.   5. The inferior vena cava is dilated in size with >50% respiratory  variability, suggesting right atrial pressure of 8 mmHg.   Comparison(s): Changes from prior study are noted. 02/04/2021: LVEF 60-65%,  severe LAE.   Patient Profile     76 y.o. female with a history of CAD with STEMI in 2011 s/p DES to RCA, chronic diastolic CHF, COPD on 2L of O2 at home, hypertension hyperlipidemia, type 2 diabetes mellitus, obesity, and dementia who was admitted on 07/04/2021 for COPD exacerbation and acute on chronic CHF.  Assessment & Plan    Acute on Chronic Diastolic CHF BNP elevated at  1,054. Chest x-ray showed cardiomegaly with prominent central pulmonary vessels suggesting CHF. Echo showed LVEF of 65-70% with normal wall motion, mild LVH, and grade 1 diastolic dysfunction. RV mildly enlarged with low normal RV systolic function and moderately elevated PASP of 50.8 mmHg. She was started on IV Lasix. Only 700 mL of urine output yesterday but net negative 2.095L this admission. Renal function improving with diuresis. - Does not have significant peripheral edema but still has crackles on exam and still reports dyspnea. - Will increase IV Lasix to 60mg  twice daily. Will supplement with  - Continue to monitor daily weights, strict I/Os, and renal function. - Continue treatment for COPD as well.  Elevated Troponin  CAD S/p DES to RCA in 2011. High-sensitivity troponin mildly elevated and flat at 100 >> 105 consistent with demand ischemia. Echo showed normal LV function and wall motion. - No angina.  - Continue aspirin, beta-blocker, and high-intensity statin. - No ischemic work-up necessary.  Hypertension BP mostly well controlled. - Continue home Amlodipine 10mg  daily and Toprol-XL 25mg  daily.  Hyperlipidemia No recent lipid panel in our system. Last LD in 2020 was 54.  - Continue Lipitor 80mg  daily.  AKI Creatinine 1.27  on admission and down to 1.03 today. Baseline around 0.7 to 0.8. - Continue to monitor closely with diuresis.  Hyperkalemia Potassium 5.4 on admission but 4.7 on recheck so unclear if this was accurate.  - Potassium 3.8 today after diuresis. - Will supplement with potassium chloride 40 mEq given we are increasing Lasix.  Hypomagnesemia Magnesium 1.5 today.  - Being supplemented by primary team.  Otherwise, per primary team: - COPD exacerbation - Type 2 diabetes mellitus - Dementia  - Elevated LFTs  For questions or updates, please contact [facility] Please consult [email or web address] for contact info under        Signed, Darreld Mclean,  PA-C  07/06/2021, 9:50 AM

## 2021-07-07 LAB — BASIC METABOLIC PANEL
Anion gap: 8 (ref 5–15)
BUN: 36 mg/dL — ABNORMAL HIGH (ref 8–23)
CO2: 36 mmol/L — ABNORMAL HIGH (ref 22–32)
Calcium: 9.6 mg/dL (ref 8.9–10.3)
Chloride: 93 mmol/L — ABNORMAL LOW (ref 98–111)
Creatinine, Ser: 1.05 mg/dL — ABNORMAL HIGH (ref 0.44–1.00)
GFR, Estimated: 55 mL/min — ABNORMAL LOW (ref >60)
Glucose, Bld: 251 mg/dL — ABNORMAL HIGH (ref 70–99)
Potassium: 4.6 mmol/L (ref 3.5–5.1)
Sodium: 137 mmol/L (ref 135–145)

## 2021-07-07 LAB — GLUCOSE, CAPILLARY
Glucose-Capillary: 150 mg/dL — ABNORMAL HIGH (ref 70–99)
Glucose-Capillary: 228 mg/dL — ABNORMAL HIGH (ref 70–99)
Glucose-Capillary: 208 mg/dL — ABNORMAL HIGH (ref 70–99)
Glucose-Capillary: 275 mg/dL — ABNORMAL HIGH (ref 70–99)

## 2021-07-07 NOTE — Progress Notes (Signed)
? ?Progress Note ? ?Patient Name: Courtney [name] ?Date of Encounter: 07/07/2021 ? ?Primary Cardiologist: None  ? ?Subjective  ? ?Patient was seen and examined at her beside. ? ?Inpatient Medications  ?  ?Scheduled Meds: ? amLODipine  10 mg Oral Daily  ? arformoterol  15 mcg Nebulization BID  ? aspirin EC  81 mg Oral Daily  ? atorvastatin  80 mg Oral QHS  ? enoxaparin (LOVENOX) injection  50 mg Subcutaneous Q24H  ? fluticasone  1 spray Each Nare Daily  ? furosemide  80 mg Intravenous Q12H  ? hydrOXYzine  25 mg Oral BID  ? insulin aspart  0-15 Units Subcutaneous TID WC  ? insulin aspart  0-5 Units Subcutaneous QHS  ? insulin glargine-yfgn  20 Units Subcutaneous Daily  ? ipratropium-albuterol  3 mL Nebulization TID  ? metoprolol succinate  25 mg Oral Daily  ? pantoprazole  40 mg Oral BID  ? predniSONE  40 mg Oral Q breakfast  ? sertraline  50 mg Oral Daily  ? ?Continuous Infusions: ? ?PRN Meds: ?acetaminophen **OR** acetaminophen, ipratropium-albuterol, nitroGLYCERIN, ondansetron **OR** ondansetron (ZOFRAN) IV  ? ?Vital Signs  ?  ?Vitals:  ? 07/07/21 0500 07/07/21 0510 07/07/21 0600 07/07/21 0838  ?BP:  (!) 148/66 137/68   ?Pulse:  69 67   ?Resp:  16 16   ?Temp:  97.6 ?F (36.4 ?C)    ?TempSrc:  Oral    ?SpO2:  99% 100% 100%  ?Weight: 100.9 kg     ?Height:      ? ? ?Intake/Output Summary (Last 24 hours) at 07/07/2021 0946 ?Last data filed at 07/07/2021 0827 ?Gross per 24 hour  ?Intake --  ?Output 1850 ml  ?Net -1850 ml  ? ?Filed Weights  ? 07/05/21 0500 07/06/21 0500 07/07/21 0500  ?Weight: 100.7 kg 100.7 kg 100.9 kg  ? ? ?Telemetry  ?  ?Sinus rhythm with rare PVC - Personally Reviewed ? ?ECG  ?  ?None- Personally Reviewed ? ?Physical Exam  ? ?General: Comfortable, ?Head: Atraumatic, normal size  ?Eyes: PEERLA, EOMI  ?Neck: Supple, normal JVD ?Cardiac: Normal S1, S2; RRR; no murmurs, rubs, or gallops ?Lungs: Clear to auscultation bilaterally ?Abd: Soft, nontender, no hepatomegaly  ?Ext: warm, +1 bilateral leg  edema ?Musculoskeletal: No deformities, BUE and BLE strength normal and equal ?Skin: Warm and dry, no rashes   ?Neuro: Alert and oriented to person, place, time, and situation, CNII-XII grossly intact, no focal deficits  ?Psych: Normal mood and affect  ? ?Labs  ?  ?Chemistry ?Recent Labs  ?Lab 07/04/21 ?1201 07/05/21 ?0357 07/05/21 ?1245 07/06/21 ?[id number] 07/07/21 ?0334  ?NA 145 141  --  140 137  ?K 4.1 5.4* 4.7 3.8 4.6  ?CL 102 98  --  98 93*  ?CO2 34* 32  --  34* 36*  ?GLUCOSE 141* 294*  --  111* 251*  ?BUN 31* 33*  --  39* 36*  ?CREATININE 1.27* 1.05*  --  1.03* 1.05*  ?CALCIUM 10.2 9.8  --  9.7 9.6  ?PROT 7.9 7.4  --   --   --   ?ALBUMIN 3.9 3.6  --   --   --   ?AST 48* 30  --   --   --   ?ALT 56* 51*  --   --   --   ?ALKPHOS 158* 142*  --   --   --   ?BILITOT 0.5 0.6  --   --   --   ?GFRNONAA 44* 55*  --  56* 55*  ?ANIONGAP 9 11  --  8 8  ?  ? ?Hematology ?Recent Labs  ?Lab 07/04/21 ?1201 07/05/21 ?0357 07/06/21 ?[id number]  ?WBC 9.3 6.6 10.5  ?RBC 4.93 4.93 4.49  ?HGB 13.0 13.0 11.7*  ?HCT 45.5 45.7 40.9  ?MCV 92.3 92.7 91.1  ?MCH 26.4 26.4 26.1  ?MCHC 28.6* 28.4* 28.6*  ?RDW 16.0* 16.0* 15.9*  ?PLT 245 214 250  ? ? ?Cardiac EnzymesNo results for input(s): TROPONINI in the last 168 hours. No results for input(s): TROPIPOC in the last 168 hours.  ? ?BNP ?Recent Labs  ?Lab 07/04/21 ?1201  ?BNP 1,054.3*  ?  ? ?DDimer No results for input(s): DDIMER in the last 168 hours.  ? ?Radiology  ?  ?No results found. ? ?Cardiac Studies  ? ?Myoview 03/07/2021: ?  The study is normal. The study is low risk. ?  No ST deviation was noted. Arrhythmias during stress: frequent PVCs. Arrhythmias during recovery: frequent PVCs. ?  LV perfusion is normal. There is no evidence of ischemia. There is no evidence of infarction. ?  Left ventricular function is normal. Nuclear stress EF: 57 %. The left ventricular ejection fraction is normal (55-65%). End diastolic cavity size is normal. ?   ?This result has not been signed. Information might be  incomplete.  ?  ?_______________ ?  ?Echocardiogram 07/05/2021: ?Impressions: ?1. Left ventricular ejection fraction, by estimation, is 65 to 70%. The  ?left ventricle has normal function. The left ventricle has no regional  ?wall motion abnormalities. There is mild left ventricular hypertrophy.  ?Left ventricular diastolic parameters  ?are consistent with Grade I diastolic dysfunction (impaired relaxation).  ? 2. Right ventricular systolic function is low normal. The right  ?ventricular size is mildly enlarged. There is moderately elevated  ?pulmonary artery systolic pressure. The estimated right ventricular  ?systolic pressure is [id number] mmHg.  ? 3. The mitral valve is grossly normal. Trivial mitral valve  ?regurgitation.  ? 4. The aortic valve is tricuspid. Aortic valve regurgitation is not  ?visualized. Aortic valve sclerosis/calcification is present, without any  ?evidence of aortic stenosis.  ? 5. The inferior vena cava is dilated in size with >50% respiratory  ?variability, suggesting right atrial pressure of 8 mmHg.  ? ?Comparison(s): Changes from prior study are noted. 02/04/2021: LVEF 60-65%,  ?severe LAE.  ? ?Patient Profile  ?   ?76 y.o. female with history of CAD with STEMI back in 2011 status post DES to the RCA, chronic diastolic heart failure, COPD with chronic respiratory failure on 2 L of oxygen at home, diabetes mellitus, obesity, dementia. ? ?Assessment & Plan  ?  ?Acute on chronic diastolic heart failure- clinically appears to be improving but still needs IV lasix. Please keep her on the current dose of lasix. ? ?CAD with elevated troponin this hospitalization --known ischemic disease status post drug-eluting stent in 2011 to the RCA.  Presentation during hospitalization with high-sensitivity troponin peaking at 105 consistent with type II mismatch demand ischemia.  I agree with continue medical management aspirin, beta-blocker as well as statin.  No need for any ischemic work-up at this  time. ? ?Hypertension-blood pressure is acceptable, continue with current antihypertensive regimen. ? ?Hyperlipidemia-hyperlipidemia - continue with current statin medication. ? ?Acute on chronic CKD-creatinine 1.05 today.  Continue to monitor creatinine while being diuresed. ? ?Electrolyte abnormalities-resolved.  Keep K above 4 and mag above 2. ? ?For questions or updates, please contact [place] ?Please consult [email or web address] for contact info under Cardiology/STEMI. ?  ?   ?  Signed, ?Berniece Salines, DO  ?07/07/2021, 9:46 AM    ?

## 2021-07-07 NOTE — Progress Notes (Signed)
?  Progress Note ? ? ?Patient: Courtney [name] GHW:[id number] DOB: [birth year] DOA: 07/04/2021     3 ?DOS: the patient was seen and examined on 07/07/2021 ?  ?Brief hospital course: ?This 76 years old female with PMH significant for COPD, type 2 diabetes, hyperlipidemia, GERD, dementia, chronic HFp EF, hypertension presented in the ED with complaints of shortness of breath.  Patient reports she was lying in the bed and rolled over to the side her rail was not and she fell out of the bed.  When the staff found her she was having wheezing and was hypoxic.  Her SPO2 was 70% on room air.  Patient was unable to recall that event.  Chest x-ray shows increased pulmonary vascular congestion suggesting CHF.  CT head no evidence of acute intracranial abnormality. ?Patient is admitted for COPD and CHF exacerbation.  Cardiology is consulted. ? ?Assessment and Plan: ?* Acute on chronic heart failure (HCC) ?Likely diastolic CHF.  Presented with shortness of breath, leg edema. ?Continued on IV lasix, dose recently increased to 60mg  bid per Cardiology ?Cardiology recs to continue current dose of lasix ?EKG unremarkable.  Echocardiogram LVEF 60 to 65%.  No RWMA ? ?Intake/Output Summary (Last 24 hours) at 07/07/2021 1335 ?Last data filed at 07/07/2021 0827 ?Gross per 24 hour  ?Intake --  ?Output 1850 ml  ?Net -1850 ml  ? ? ?Elevated troponin ?Troponin 100> 105> 105 flat, less likely ACS. ?Likely consistent with demand ischemia in the setting of CHF exacerbation.  Cardiology is consulted. ?Per Cardiology, no plans for ischemic work up ? ?AKI (acute kidney injury) (HCC) ?Baseline creatinine 0.6-0.8 > creatinine at admission 1.27 ?Renal functions improving with diuresis. ?Avoid nephrotoxic medications,  ?Continue to monitor serum creatinine. ?Cr 1.03 ? ?Elevated LFTs ?Hepatitis panel negative. ?Continue monitor liver enzymes. ? ?Dementia without behavioral disturbance (HCC) ?Continue home medications. ? ?Hypertension ?Continue amlodipine and  metoprolol. ? ?Diabetes mellitus due to underlying condition, uncontrolled, with hyperglycemia, with long-term current use of insulin (HCC) ?Start Semglee 20 units daily. ?Continue regular insulin sliding scale. ? ?COPD (chronic obstructive pulmonary disease) (HCC) ?Continue with prednisone daily. ?Continue supplemental oxygen and wean as tolerated. ?Continue scheduled and as needed nebulized bronchodilators. ?Currently on 3LNC ?Remains with wheezing this AM ? ?HLD (hyperlipidemia) ?Continue atorvastatin. ? ? ?  ? ?Subjective: Without complaints this AM ? ?Physical Exam: ?Vitals:  ? 07/07/21 0500 07/07/21 0510 07/07/21 0600 07/07/21 0838  ?BP:  (!) 148/66 137/68   ?Pulse:  69 67   ?Resp:  16 16   ?Temp:  97.6 ?F (36.4 ?C)    ?TempSrc:  Oral    ?SpO2:  99% 100% 100%  ?Weight: 100.9 kg     ?Height:      ? ?General exam: Conversant, in no acute distress ?Respiratory system: normal chest rise, clear, wheezing more on R ?Cardiovascular system: regular rhythm, s1-s2 ?Gastrointestinal system: Nondistended, nontender, pos BS ?Central nervous system: No seizures, no tremors ?Extremities: No cyanosis, no joint deformities ?Skin: No rashes, no pallor ?Psychiatry: Affect normal // no auditory hallucinations  ? ?Data Reviewed: ? ?Labs reviewed. Cr 1.05 ? ?Family Communication: Pt in room, family not at bedside ? ?Disposition: ?Status is: Inpatient ?Remains inpatient appropriate because: Severity of illness ? Planned Discharge Destination: Skilled nursing facility ? ? ? ?Author: ?09/06/21, MD ?07/07/2021 1:36 PM ? ?For on call review [email or web address].  ?

## 2021-07-07 NOTE — Progress Notes (Signed)
Inpatient Diabetes Program Recommendations ? ?AACE/ADA: New Consensus Statement on Inpatient Glycemic Control (2015) ? ?Target Ranges:  Prepandial:   less than 140 mg/dL ?     Peak postprandial:   less than 180 mg/dL (1-2 hours) ?     Critically ill patients:  140 - 180 mg/dL  ? ?Lab Results  ?Component Value Date  ? GLUCAP 228 (H) 07/07/2021  ? HGBA1C 6.6 (H) 07/04/2021  ? ? ?Review of Glycemic Control ? Latest Reference Range & Units 07/06/21 07:45 07/06/21 12:25 07/06/21 17:08 07/06/21 20:39 07/07/21 07:58 07/07/21 12:02  ?Glucose-Capillary 70 - 99 mg/dL 92 179 (H) 257 (H) 422 (H) 150 (H) 228 (H)  ?(H): Data is abnormally high ? ? ?Inpatient Diabetes Program Recommendations:   ? ?Might consider Novolog 2 units TID with meals if eats at least 50% ? ?Will continue to follow while inpatient. ? ?Thank you, ?Reche Dixon, MSN, RN ?Diabetes Coordinator ?Inpatient Diabetes Program ?[phone] (team pager from 8a-5p) ? ? ? ?

## 2021-07-07 NOTE — Care Management Important Message (Signed)
Important Message ? ?Patient Details IM Letter placed in Patients room. ?Name: Courtney [name] ?MRN: [id number] ?Date of Birth: Sep 20, 1945 ? ? ?Medicare Important Message Given:  Yes ? ? ? ? ?Caren Macadam ?07/07/2021, 2:13 PM ?

## 2021-07-08 DIAGNOSIS — R778 Other specified abnormalities of plasma proteins: Secondary | ICD-10-CM

## 2021-07-08 DIAGNOSIS — I2510 Atherosclerotic heart disease of native coronary artery without angina pectoris: Secondary | ICD-10-CM

## 2021-07-08 LAB — BASIC METABOLIC PANEL
Anion gap: 7 (ref 5–15)
BUN: 41 mg/dL — ABNORMAL HIGH (ref 8–23)
CO2: 39 mmol/L — ABNORMAL HIGH (ref 22–32)
Calcium: 9.8 mg/dL (ref 8.9–10.3)
Chloride: 94 mmol/L — ABNORMAL LOW (ref 98–111)
Creatinine, Ser: 1.07 mg/dL — ABNORMAL HIGH (ref 0.44–1.00)
GFR, Estimated: 53 mL/min — ABNORMAL LOW (ref >60)
Glucose, Bld: 427 mg/dL — ABNORMAL HIGH (ref 70–99)
Potassium: 4.5 mmol/L (ref 3.5–5.1)
Sodium: 140 mmol/L (ref 135–145)

## 2021-07-08 LAB — GLUCOSE, CAPILLARY
Glucose-Capillary: 424 mg/dL — ABNORMAL HIGH (ref 70–99)
Glucose-Capillary: 382 mg/dL — ABNORMAL HIGH (ref 70–99)
Glucose-Capillary: 274 mg/dL — ABNORMAL HIGH (ref 70–99)

## 2021-07-08 LAB — MAGNESIUM: Magnesium: 1.8 mg/dL (ref 1.7–2.4)

## 2021-07-08 MED ORDER — MAGNESIUM SULFATE 2 GM/50ML IV SOLN
2.0000 g | Freq: Once | INTRAVENOUS | Status: AC
  Administered 2021-07-08: 2 g via INTRAVENOUS
  Filled 2021-07-08: qty 50

## 2021-07-08 MED ORDER — INSULIN ASPART 100 UNIT/ML IJ SOLN
2.0000 [IU] | Freq: Three times a day (TID) | INTRAMUSCULAR | Status: DC
  Administered 2021-07-08: 2 [IU] via SUBCUTANEOUS

## 2021-07-08 MED ORDER — FUROSEMIDE 40 MG PO TABS: 40.0000 mg | ORAL_TABLET | Freq: Two times a day (BID) | ORAL | Status: DC

## 2021-07-08 MED ORDER — PREDNISONE 20 MG PO TABS
40.0000 mg | ORAL_TABLET | Freq: Every day | ORAL | 0 refills | Status: AC
Start: 2021-07-09 — End: 2021-07-11

## 2021-07-08 MED ORDER — EMPAGLIFLOZIN 10 MG PO TABS
10.0000 mg | ORAL_TABLET | Freq: Every day | ORAL | 0 refills | Status: DC
Start: 2021-07-08 — End: 2023-03-26

## 2021-07-08 MED ORDER — FUROSEMIDE 40 MG PO TABS
40.0000 mg | ORAL_TABLET | Freq: Two times a day (BID) | ORAL | 0 refills | Status: AC
Start: 2021-07-08 — End: 2023-03-26

## 2021-07-08 NOTE — Progress Notes (Addendum)
? ?Progress Note ? ?Patient Name: Courtney [name] ?Date of Encounter: 07/08/2021 ? ?[facility] Cardiologist: Larae Grooms, MD  ? ?Subjective  ? ?Breathing improving, almost back to normal.  No chest pain.  Endorses eating high salt diet. ? ?Inpatient Medications  ?  ?Scheduled Meds: ? amLODipine  10 mg Oral Daily  ? arformoterol  15 mcg Nebulization BID  ? aspirin EC  81 mg Oral Daily  ? atorvastatin  80 mg Oral QHS  ? enoxaparin (LOVENOX) injection  50 mg Subcutaneous Q24H  ? fluticasone  1 spray Each Nare Daily  ? furosemide  80 mg Intravenous Q12H  ? hydrOXYzine  25 mg Oral BID  ? insulin aspart  0-15 Units Subcutaneous TID WC  ? insulin aspart  0-5 Units Subcutaneous QHS  ? insulin aspart  2 Units Subcutaneous TID WC  ? insulin glargine-yfgn  20 Units Subcutaneous Daily  ? ipratropium-albuterol  3 mL Nebulization TID  ? metoprolol succinate  25 mg Oral Daily  ? pantoprazole  40 mg Oral BID  ? predniSONE  40 mg Oral Q breakfast  ? sertraline  50 mg Oral Daily  ? ?Continuous Infusions: ? ?PRN Meds: ?acetaminophen **OR** acetaminophen, ipratropium-albuterol, nitroGLYCERIN, ondansetron **OR** ondansetron (ZOFRAN) IV  ? ?Vital Signs  ?  ?Vitals:  ? 07/07/21 2114 07/08/21 0500 07/08/21 0508 07/08/21 0600  ?BP:   140/71 (!) 120/42  ?Pulse:   78   ?Resp:   20   ?Temp:   97.7 ?F (36.5 ?C)   ?TempSrc:   Oral   ?SpO2: 100%  97%   ?Weight:  99.5 kg    ?Height:      ? ? ?Intake/Output Summary (Last 24 hours) at 07/08/2021 0904 ?Last data filed at 07/08/2021 0400 ?Gross per 24 hour  ?Intake 240 ml  ?Output 1900 ml  ?Net -1660 ml  ? ?Last 3 Weights 07/08/2021 07/07/2021 07/06/2021  ?Weight (lbs) 219 lb 5.7 oz 222 lb 7.1 oz 222 lb 0.1 oz  ?Weight (kg) 99.5 kg 100.9 kg 100.7 kg  ?   ? ?Telemetry  ?  ?Sinus rhythm, intermittent ventricular bigeminy- Personally Reviewed ? ?ECG  ?  ?No new tracing- Personally Reviewed ? ?Physical Exam  ? ?GEN: No acute distress.   ?Neck: No JVD ?Cardiac: RRR, no murmurs, rubs, or gallops.   ?Respiratory: Clear to auscultation bilaterally. ?GI: Soft, nontender, non-distended  ?MS: No edema; No deformity. ?Neuro:  Nonfocal  ?Psych: Normal affect  ? ?Labs  ?  ?High Sensitivity Troponin:   ?Recent Labs  ?Lab 07/04/21 ?1201 07/04/21 ?1623  ?TROPONINIHS 100* 105*  ?   ?Chemistry ?Recent Labs  ?Lab 07/04/21 ?1201 07/04/21 ?2315 07/05/21 ?0357 07/05/21 ?1245 07/06/21 ?[id number] 07/07/21 ?0334 07/08/21 ?0337  ?NA 145  --  141  --  140 137 140  ?K 4.1  --  5.4*   < > 3.8 4.6 4.5  ?CL 102  --  98  --  98 93* 94*  ?CO2 34*  --  32  --  34* 36* 39*  ?GLUCOSE 141*  --  294*  --  111* 251* 427*  ?BUN 31*  --  33*  --  39* 36* 41*  ?CREATININE 1.27*  --  1.05*  --  1.03* 1.05* 1.07*  ?CALCIUM 10.2  --  9.8  --  9.7 9.6 9.8  ?MG  --  1.7  --   --  1.5*  --  1.8  ?PROT 7.9  --  7.4  --   --   --   --   ?  ALBUMIN 3.9  --  3.6  --   --   --   --   ?AST 48*  --  30  --   --   --   --   ?ALT 56*  --  51*  --   --   --   --   ?ALKPHOS 158*  --  142*  --   --   --   --   ?BILITOT 0.5  --  0.6  --   --   --   --   ?GFRNONAA 44*  --  55*  --  56* 55* 53*  ?ANIONGAP 9  --  11  --  8 8 7   ? < > = values in this interval not displayed.  ?  ?Lipids No results for input(s): CHOL, TRIG, HDL, LABVLDL, LDLCALC, CHOLHDL in the last 168 hours.  ?Hematology ?Recent Labs  ?Lab 07/04/21 ?1201 07/05/21 ?0357 07/06/21 ?[id number]  ?WBC 9.3 6.6 10.5  ?RBC 4.93 4.93 4.49  ?HGB 13.0 13.0 11.7*  ?HCT 45.5 45.7 40.9  ?MCV 92.3 92.7 91.1  ?MCH 26.4 26.4 26.1  ?MCHC 28.6* 28.4* 28.6*  ?RDW 16.0* 16.0* 15.9*  ?PLT 245 214 250  ? ? ?BNP ?Recent Labs  ?Lab 07/04/21 ?1201  ?BNP 1,054.3*  ? ? ?Radiology  ?  ?No results found. ? ?Cardiac Studies  ? ?Echo 07/05/21 ?1. Left ventricular ejection fraction, by estimation, is 65 to 70%. The  ?left ventricle has normal function. The left ventricle has no regional  ?wall motion abnormalities. There is mild left ventricular hypertrophy.  ?Left ventricular diastolic parameters  ?are consistent with Grade I diastolic dysfunction  (impaired relaxation).  ? 2. Right ventricular systolic function is low normal. The right  ?ventricular size is mildly enlarged. There is moderately elevated  ?pulmonary artery systolic pressure. The estimated right ventricular  ?systolic pressure is [id number] mmHg.  ? 3. The mitral valve is grossly normal. Trivial mitral valve  ?regurgitation.  ? 4. The aortic valve is tricuspid. Aortic valve regurgitation is not  ?visualized. Aortic valve sclerosis/calcification is present, without any  ?evidence of aortic stenosis.  ? 5. The inferior vena cava is dilated in size with >50% respiratory  ?variability, suggesting right atrial pressure of 8 mmHg.  ? ?Comparison(s): Changes from prior study are noted. 02/04/2021: LVEF 60-65%,  ?severe LAE.  ? ? ?Patient Profile  ?   ?76 y.o. female with a history of CAD with STEMI in 2011 s/p DES to RCA, chronic diastolic CHF, COPD on 2L of O2 at home, hypertension hyperlipidemia, type 2 diabetes mellitus, obesity, and dementia who was admitted on 07/04/2021 for COPD exacerbation and acute on chronic CHF. ? ?Assessment & Plan  ?  ?Acute on Chronic Diastolic CHF ?BNP elevated at 1,054. Chest x-ray showed cardiomegaly with prominent central pulmonary vessels suggesting CHF. Echo showed LVEF of 65-70% with normal wall motion, mild LVH, and grade 1 diastolic dysfunction. RV mildly enlarged with low normal RV systolic function and moderately elevated PASP of 50.8 mmHg. ?- She was started on IV Lasix. Net I & O -5.6L.  Weight 221>>219lb. Renal function improving with diuresis. ?- She appears euvolemic. ?-Give IV Lasix this morning and then transition to p.o. Will review dose with MD.  At length discussion regarding reducing salt intake which is likely cause of CHF exacerbation. ?-Continue beta-blocker ?  ?Elevated Troponin  ?CAD S/p DES to RCA in 2011 ?- High-sensitivity troponin mildly elevated and flat at 100 >> 105 consistent with  demand ischemia. Echo showed normal LV function and wall  motion. ?- No angina.  ?- Continue aspirin, beta-blocker, and high-intensity statin. ?- No ischemic work-up necessary. ?  ?Hypertension ?BP mostly well controlled. ?- Continue home Amlodipine 10mg  daily and Toprol-XL 25mg  daily. ?  ?Hyperlipidemia ?No recent lipid panel in our system. Last LD in 2020 was 54.  ?- Continue Lipitor 80mg  daily. ?  ?AKI ?Creatinine 1.27 on admission and down to 1.07 today. Baseline around 0.7 to 0.8. ?- Continue to monitor closely with diuresis. ?  ?PVC/ventricular bigeminy ?Potassium 4.5 ?Magnesium 1.8 >> I will supplement ?-Continue beta-blocker ?-Keep K greater than 4 and Mg greater than 2 ? ?COPD exacerbation ?Diabetes mellitus with hyperglycemia (blood sugar 427 this morning) ?Plan per primary team ? ? ?Ambulate.  Transition to p.o. Lasix later today.  Cardiology follow-up has been arranged on 4/5. Sign off per MD.  ? ?For questions or updates, please contact [place] ?Please consult [email or web address] for contact info under  ? ?  ?   ?Signed, ?Leanor Kail, PA  ?07/08/2021, 9:04 AM    ?

## 2021-07-08 NOTE — Progress Notes (Signed)
Inpatient Diabetes Program Recommendations ? ?AACE/ADA: New Consensus Statement on Inpatient Glycemic Control (2015) ? ?Target Ranges:  Prepandial:   less than 140 mg/dL ?     Peak postprandial:   less than 180 mg/dL (1-2 hours) ?     Critically ill patients:  140 - 180 mg/dL  ? ?Lab Results  ?Component Value Date  ? GLUCAP 424 (H) 07/08/2021  ? HGBA1C 6.6 (H) 07/04/2021  ? ? ?Review of Glycemic Control ? Latest Reference Range & Units 07/07/21 07:58 07/07/21 12:02 07/07/21 16:54 07/07/21 21:14 07/08/21 08:05  ?Glucose-Capillary 70 - 99 mg/dL 672 (H) 094 (H) 709 (H) 275 (H) 424 (H)  ?(H): Data is abnormally high ? ?Diabetes history: DM2 ?Outpatient Diabetes medications: Lantus 21 units BID, Novolog 0-17 units TID ?Current orders for Inpatient glycemic control: Semglee 20 QD, Novolog 0-15 units TID and 0-5 QHS, Prednisone 40 mg QD ? ?Inpatient Diabetes Program Recommendations:   ? ?Semglee 20 units BID  ?Novolog 2 units mctid ? ?Will continue to follow while inpatient. ? ?Thank you, ?Dulce Sellar, MSN, RN ?Diabetes Coordinator ?Inpatient Diabetes Program ?[phone] (team pager from 8a-5p) ? ? ? ? ? ?

## 2021-07-08 NOTE — TOC Progression Note (Signed)
Transition of Care (TOC) - Progression Note  ? ? ?Patient Details  ?Name: Courtney [name] ?MRN: [id number] ?Date of Birth: 03-05-1946 ? ?Transition of Care (TOC) CM/SW Contact  ?Geni Bers, RN ?Phone Number: ?07/08/2021, 1:30 PM ? ?Clinical Narrative:    ?RN called 3x to give report. A call was made to [facility] and text AC to inform her that RN was trying to call to give report.  ? ? ?Expected Discharge Plan: Skilled Nursing Facility ?Barriers to Discharge: Continued Medical Work up ? ?Expected Discharge Plan and Services ?Expected Discharge Plan: Skilled Nursing Facility ?  ?  ?  ?  ?Expected Discharge Date: 07/08/21               ?  ?  ?  ?  ?  ?  ?  ?  ?  ?  ? ? ?Social Determinants of Health (SDOH) Interventions ?  ? ?Readmission Risk Interventions ?No flowsheet data found. ? ?

## 2021-07-08 NOTE — TOC Progression Note (Signed)
Transition of Care (TOC) - Progression Note  ? ? ?Patient Details  ?Name: Courtney [name] ?MRN: [id number] ?Date of Birth: [birth year] ? ?Transition of Care (TOC) CM/SW Contact  ?Geni Bers, RN ?Phone Number: ?07/08/2021, 9:35 AM ? ?Clinical Narrative:    ?When medically stable may return to [facility]. Pt is LTC there.  ? ? ?Expected Discharge Plan: Skilled Nursing Facility ?Barriers to Discharge: Continued Medical Work up ? ?Expected Discharge Plan and Services ?Expected Discharge Plan: Skilled Nursing Facility ?  ?  ?  ?  ?                ?  ?  ?  ?  ?  ?  ?  ?  ?  ?  ? ? ?Social Determinants of Health (SDOH) Interventions ?  ? ?Readmission Risk Interventions ?No flowsheet data found. ? ?

## 2021-07-08 NOTE — Progress Notes (Signed)
AVS reviewed with patient, patient had no further questions, IV was removed, Transportation has been called.  ? ? ?

## 2021-07-08 NOTE — Progress Notes (Signed)
Courtney [name] has been called 4 times, receiving nurse has received message to call this nurse so I can give them report.  ? ?Transportation has been called for patient.  ? ?Patients spouse has been notified patient will be returning to [place] and was also updated on patients current condition.  ?

## 2021-07-08 NOTE — TOC Progression Note (Signed)
Transition of Care (TOC) - Progression Note  ? ? ?Patient Details  ?Name: Courtney [name] ?MRN: [id number] ?Date of Birth: June 08, 1945 ? ?Transition of Care (TOC) CM/SW Contact  ?Geni Bers, RN ?Phone Number: ?07/08/2021, 1:21 PM ? ?Clinical Narrative:    ? ?Husband called to informed him that pt would transfer back to [facility] today. ? ?Expected Discharge Plan: Skilled Nursing Facility ?Barriers to Discharge: Continued Medical Work up ? ?Expected Discharge Plan and Services ?Expected Discharge Plan: Skilled Nursing Facility ?  ?  ?  ?  ?Expected Discharge Date: 07/08/21               ?  ?  ?  ?  ?  ?  ?  ?  ?  ?  ? ? ?Social Determinants of Health (SDOH) Interventions ?  ? ?Readmission Risk Interventions ?No flowsheet data found. ? ?

## 2021-07-08 NOTE — Discharge Summary (Signed)
Physician Discharge Summary   Patient: Courtney [name] MRN: [id number] DOB: 1946-01-15  Admit date:     07/04/2021  Discharge date: 07/08/21  Discharge Physician: Marylu Lund   PCP: Mindi Curling, PA-C   Recommendations at discharge:    Follow up with PCP in 1-2 weeks Please adjust insulin to achieve euglycemia  Discharge Diagnoses: Principal Problem:   Acute on chronic heart failure (HCC) Active Problems:   HLD (hyperlipidemia)   COPD (chronic obstructive pulmonary disease) (HCC)   Diabetes mellitus due to underlying condition, uncontrolled, with hyperglycemia, with long-term current use of insulin (HCC)   Hypertension   Acute on chronic combined systolic and diastolic CHF (congestive heart failure) ([place])   Fall at home, initial encounter   Dementia without behavioral disturbance (HCC)   Elevated LFTs   AKI (acute kidney injury) ([place])   Elevated troponin  Resolved Problems:   * No resolved hospital problems. [facility] Course: This 76 years old female with PMH significant for COPD, type 2 diabetes, hyperlipidemia, GERD, dementia, chronic HFp EF, hypertension presented in the ED with complaints of shortness of breath.  Patient reports she was lying in the bed and rolled over to the side her rail was not and she fell out of the bed.  When the staff found her she was having wheezing and was hypoxic.  Her SPO2 was 70% on room air.  Patient was unable to recall that event.  Chest x-ray shows increased pulmonary vascular congestion suggesting CHF.  CT head no evidence of acute intracranial abnormality. Patient is admitted for COPD and CHF exacerbation.  Cardiology is consulted.  Assessment and Plan: * Acute on chronic heart failure (HCC) Likely diastolic CHF.  Presented with shortness of breath, leg edema. Had been continued on IV lasix Cardiology transitioned to lasix PO BID at time of d/c EKG unremarkable.  Echocardiogram LVEF 60 to 65%.  No RWMA  Have prescribed jardiance  per Cardiology recs  Intake/Output Summary (Last 24 hours) at 07/08/2021 1234 Last data filed at 07/08/2021 0400 Gross per 24 hour  Intake 240 ml  Output 1900 ml  Net -1660 ml    Elevated troponin Troponin 100> 105> 105 flat, less likely ACS. Likely consistent with demand ischemia in the setting of CHF exacerbation.  Cardiology consulted Per Cardiology, no plans for ischemic work up  AKI (acute kidney injury) ([place]) Baseline creatinine 0.6-0.8 > creatinine at admission 1.27 Renal functions improving with diuresis. Avoid nephrotoxic medications,    Elevated LFTs Hepatitis panel negative.   Dementia without behavioral disturbance ([place]) Continue home medications.  Hypertension Continue amlodipine and metoprolol.  Diabetes mellitus due to underlying condition, uncontrolled, with hyperglycemia, with long-term current use of insulin (HCC) Continue long-acting insulin  Continue insulin sliding scale as needed Cardiology has recommended SGLT2. Will d/c home metformin and prescribe jardiance on d/c  COPD (chronic obstructive pulmonary disease) ([place]) Continue with prednisone daily, complete 2 more days on d/c Continue supplemental oxygen and wean as tolerated. Continue scheduled and as needed nebulized bronchodilators. Weaned to [place] with wheezing this AM  HLD (hyperlipidemia) Continue atorvastatin.         Consultants: Cardiology Procedures performed:   Disposition: Skilled nursing facility Diet recommendation:  Cardiac and Carb modified diet DISCHARGE MEDICATION: Allergies as of 07/08/2021       Reactions   Eggs Or Egg-derived Products Swelling   Listed on [facility] 07/04/21 Patient reports had face swelling.    Penicillins Other (See Comments)   Did it  involve swelling of the face/tongue/throat, SOB, or low BP? No Did it involve sudden or severe rash/hives, skin peeling, or any reaction on the inside of your mouth or nose? Yes Did you need to seek medical  attention at a hospital or doctor's office? yes When did it last happen?      more than 10 years If all above answers are "NO", may proceed with cephalosporin use.        Medication List     STOP taking these medications    loratadine 10 MG tablet Commonly known as: CLARITIN   metFORMIN 1000 MG tablet Commonly known as: GLUCOPHAGE       TAKE these medications    acetaminophen 325 MG tablet Commonly known as: TYLENOL Take 650 mg by mouth every 8 (eight) hours as needed (pain).   amLODipine 10 MG tablet Commonly known as: NORVASC Take 1 tablet (10 mg total) by mouth daily.   arformoterol 15 MCG/2ML Nebu Commonly known as: BROVANA Take 2 mLs (15 mcg total) by nebulization 2 (two) times daily.   aspirin 81 MG EC tablet Take 1 tablet (81 mg total) by mouth daily.   atorvastatin 80 MG tablet Commonly known as: LIPITOR Take 80 mg by mouth at bedtime.   Bevespi Aerosphere 9-4.8 MCG/ACT Aero Generic drug: Glycopyrrolate-Formoterol Inhale 2 puffs into the lungs 2 (two) times daily.   budesonide 0.25 MG/2ML nebulizer solution Commonly known as: PULMICORT Take 0.25 mg by nebulization 2 (two) times daily.   empagliflozin 10 MG Tabs tablet Commonly known as: Jardiance Take 1 tablet (10 mg total) by mouth daily before breakfast.   ferrous sulfate 325 (65 FE) MG tablet Take 1 tablet (325 mg total) by mouth 2 (two) times daily with a meal. What changed: when to take this   fluticasone 50 MCG/ACT nasal spray Commonly known as: FLONASE Place 1 spray into both nostrils daily.   furosemide 40 MG tablet Commonly known as: LASIX Take 1 tablet (40 mg total) by mouth 2 (two) times daily. What changed: when to take this   GlucaGen HypoKit 1 MG Solr injection Generic drug: glucagon Inject 1 mg into the muscle daily as needed for low blood sugar.   hydrOXYzine 25 MG tablet Commonly known as: ATARAX Take 25 mg by mouth 2 (two) times daily. For itching   insulin glargine  100 unit/mL Sopn Commonly known as: LANTUS Inject 21 Units into the skin 2 (two) times daily.   ipratropium-albuterol 0.5-2.5 (3) MG/3ML Soln Commonly known as: DUONEB Take 3 mLs by nebulization every 6 (six) hours as needed. What changed: when to take this   melatonin 3 MG Tabs tablet Take 1 tablet (3 mg total) by mouth at bedtime. What changed: how much to take   memantine 5 MG tablet Commonly known as: NAMENDA Take 5 mg by mouth at bedtime.   metoprolol succinate 25 MG 24 hr tablet Commonly known as: TOPROL-XL Take 25 mg by mouth daily.   nitroGLYCERIN 0.4 MG SL tablet Commonly known as: NITROSTAT Place 0.4 mg under the tongue every 5 (five) minutes as needed for chest pain.   NovoLOG FlexPen 100 UNIT/ML FlexPen Generic drug: insulin aspart Inject 0-17 Units into the skin See admin instructions. Inject 0-16 units subcutaneously before meals and at bedtime per sliding scale: CBG 0-150 0 units, 151-200 4 units, 201-250 6 units, 251-300 8 units, 301-350 10 units, 351-400 14 units, 401-450 16 units, >450 contact MD   Nutritional Supplement Liqd Take 1 each by mouth  daily. Health Shake   OXYGEN Inhale 2 L into the lungs continuous.   pantoprazole 40 MG tablet Commonly known as: PROTONIX Take 40 mg by mouth 2 (two) times daily.   polyethylene glycol 17 g packet Commonly known as: MIRALAX / GLYCOLAX Take 17 g by mouth daily.   predniSONE 20 MG tablet Commonly known as: DELTASONE Take 2 tablets (40 mg total) by mouth daily with breakfast for 2 days. Start taking on: July 09, 2021   saccharomyces boulardii 250 MG capsule Commonly known as: FLORASTOR Take 250 mg by mouth 2 (two) times daily.   senna 8.6 MG Tabs tablet Commonly known as: SENOKOT Take 2 tablets by mouth 2 (two) times daily.   sertraline 50 MG tablet Commonly known as: ZOLOFT Take 50 mg by mouth daily.   Vitamin D-3 125 MCG (5000 UT) Tabs Take 5,000 Units by mouth daily.        Contact  information for follow-up providers     Loel Dubonnet, NP Follow up on 08/03/2021.   Specialty: Cardiology Why: @8 :50am for hospital cardiology follow up. Please arrive 15 minutes early Contact information: [place] [address] [phone]         Mindi Curling, PA-C Follow up in 2 week(s).   Specialty: Physician Assistant Why: Hospital follow up Contact information: [place] [address] [phone]         Jettie Booze, MD .   Specialties: Cardiology, Radiology, Interventional Cardiology Contact information: [id number] [address] [phone]              Contact information for after-discharge care     Destination     [facility] .   Service: Skilled Nursing Contact information: [address] [place] [phone]                    Discharge Exam: Filed Weights   07/06/21 0500 07/07/21 0500 07/08/21 0500  Weight: 100.7 kg 100.9 kg 99.5 kg   General exam: Awake, laying in bed, in nad Respiratory system: Normal respiratory effort, no wheezing Cardiovascular system: regular rate, s1, s2 Gastrointestinal system: Soft, nondistended, positive BS Central nervous system: CN2-12 grossly intact, strength intact Extremities: Perfused, no clubbing Skin: Normal skin turgor, no notable skin lesions seen Psychiatry: Mood normal // no visual hallucinations   Condition at discharge: good  The results of significant diagnostics from this hospitalization (including imaging, microbiology, ancillary and laboratory) are listed below for reference.   Imaging Studies: CT Head Wo Contrast  Result Date: 07/04/2021 CLINICAL DATA:  Head trauma, minor (Age >= 65y); Neck trauma (Age >= 65y) EXAM: CT HEAD WITHOUT CONTRAST CT CERVICAL SPINE WITHOUT CONTRAST TECHNIQUE: Multidetector CT imaging of the head and cervical  spine was performed following the standard protocol without intravenous contrast. Multiplanar CT image reconstructions of the cervical spine were also generated. RADIATION DOSE REDUCTION: This exam was performed according to the departmental dose-optimization program which includes automated exposure control, adjustment of the mA and/or kV according to patient size and/or use of iterative reconstruction technique. COMPARISON:  MRI head very 05/20/2004 FINDINGS: CT HEAD FINDINGS Brain: No evidence of acute large vascular territory infarction, hemorrhage, hydrocephalus, extra-axial collection or mass lesion/mass effect. Areas of hypoattenuation within the right greater than left white matter, nonspecific but compatible with chronic microvascular ischemic disease. Ex vacuo ventricular dilation Vascular: Calcific intracranial atherosclerosis. No hyperdense  vessel identified Skull: No evidence of acute fracture. Sinuses/Orbits: Minimal paranasal sinus mucosal thickening. Unremarkable orbits. Other: Trace bilateral mastoid effusions CT CERVICAL SPINE FINDINGS Limited study due to beam hardening from patient body habitus. Within this limitation: Alignment: Straightening.  No substantial sagittal subluxation. Skull base and vertebrae: Vertebral body heights are maintained. No evidence of acute fracture. Soft tissues and spinal canal: Motion limited without evidence of large canal hematoma or obvious prevertebral edema. Disc levels: Multilevel facet and uncovertebral hypertrophy with resulting varying degrees of neural foraminal stenosis. Moderate multilevel degenerative disc disease, including posterior disc/osteophyte complexes and bridging anterior osteophytes. Craniocervical degenerative change. Upper chest: Biapical pleuroparenchymal scarring with otherwise clear lung apices. IMPRESSION: CT head: 1. No evidence of acute intracranial abnormality. 2. Chronic microvascular ischemic disease. CT cervical spine: 1. Limited  study without evidence of acute fracture or traumatic malalignment. 2. Multilevel degenerative change as detailed above. Electronically Signed   By: Margaretha Sheffield M.D.   On: 07/04/2021 14:12   CT Cervical Spine Wo Contrast  Result Date: 07/04/2021 CLINICAL DATA:  Head trauma, minor (Age >= 65y); Neck trauma (Age >= 65y) EXAM: CT HEAD WITHOUT CONTRAST CT CERVICAL SPINE WITHOUT CONTRAST TECHNIQUE: Multidetector CT imaging of the head and cervical spine was performed following the standard protocol without intravenous contrast. Multiplanar CT image reconstructions of the cervical spine were also generated. RADIATION DOSE REDUCTION: This exam was performed according to the departmental dose-optimization program which includes automated exposure control, adjustment of the mA and/or kV according to patient size and/or use of iterative reconstruction technique. COMPARISON:  MRI head very 05/20/2004 FINDINGS: CT HEAD FINDINGS Brain: No evidence of acute large vascular territory infarction, hemorrhage, hydrocephalus, extra-axial collection or mass lesion/mass effect. Areas of hypoattenuation within the right greater than left white matter, nonspecific but compatible with chronic microvascular ischemic disease. Ex vacuo ventricular dilation Vascular: Calcific intracranial atherosclerosis. No hyperdense vessel identified Skull: No evidence of acute fracture. Sinuses/Orbits: Minimal paranasal sinus mucosal thickening. Unremarkable orbits. Other: Trace bilateral mastoid effusions CT CERVICAL SPINE FINDINGS Limited study due to beam hardening from patient body habitus. Within this limitation: Alignment: Straightening.  No substantial sagittal subluxation. Skull base and vertebrae: Vertebral body heights are maintained. No evidence of acute fracture. Soft tissues and spinal canal: Motion limited without evidence of large canal hematoma or obvious prevertebral edema. Disc levels: Multilevel facet and uncovertebral  hypertrophy with resulting varying degrees of neural foraminal stenosis. Moderate multilevel degenerative disc disease, including posterior disc/osteophyte complexes and bridging anterior osteophytes. Craniocervical degenerative change. Upper chest: Biapical pleuroparenchymal scarring with otherwise clear lung apices. IMPRESSION: CT head: 1. No evidence of acute intracranial abnormality. 2. Chronic microvascular ischemic disease. CT cervical spine: 1. Limited study without evidence of acute fracture or traumatic malalignment. 2. Multilevel degenerative change as detailed above. Electronically Signed   By: Margaretha Sheffield M.D.   On: 07/04/2021 14:12   DG Chest Port 1 View  Result Date: 07/04/2021 CLINICAL DATA:  Shortness of breath, recent fall EXAM: PORTABLE CHEST 1 VIEW COMPARISON:  Previous studies including the examination of 03/07/2021 FINDINGS: Transverse diameter of heart is increased. Central pulmonary vessels are prominent. There is prominence of interstitial markings in the parahilar regions and lower lung fields. There is interval appearance of linear density in the left parahilar region and interval clearing of linear density in the right parahilar region. Costophrenic angles are clear. There is no pneumothorax. IMPRESSION: Cardiomegaly. Central pulmonary vessels are prominent suggesting CHF. There is interval clearing of subsegmental atelectasis in the right  parahilar region and interval appearance of subsegmental atelectasis in the left parahilar region since 03/07/2021. Electronically Signed   By: Elmer Picker M.D.   On: 07/04/2021 13:12   ECHOCARDIOGRAM COMPLETE  Result Date: 07/05/2021    ECHOCARDIOGRAM REPORT   Patient Name:   AWA THEROUX Date of Exam: 07/05/2021 Medical Rec #:  [id number]       Height:       63.0 in Accession #:    [id number]      Weight:       222.0 lb Date of Birth:  11/01/1945        BSA:          2.021 m Patient Age:    25 years        BP:           139/74 mmHg  Patient Gender: F               HR:           91 bpm. Exam Location:  Inpatient Procedure: 2D Echo, Cardiac Doppler, Color Doppler and Intracardiac            Opacification Agent Indications:    I50.31 CHF  History:        Patient has prior history of Echocardiogram examinations, most                 recent 02/04/2021. CAD and Previous Myocardial Infarction, COPD;                 Risk Factors:Diabetes, Dyslipidemia and Hypertension.  Sonographer:    Beryle Beams Referring Phys: [id number] [place]  1. Left ventricular ejection fraction, by estimation, is 65 to 70%. The left ventricle has normal function. The left ventricle has no regional wall motion abnormalities. There is mild left ventricular hypertrophy. Left ventricular diastolic parameters are consistent with Grade I diastolic dysfunction (impaired relaxation).  2. Right ventricular systolic function is low normal. The right ventricular size is mildly enlarged. There is moderately elevated pulmonary artery systolic pressure. The estimated right ventricular systolic pressure is [id number] mmHg.  3. The mitral valve is grossly normal. Trivial mitral valve regurgitation.  4. The aortic valve is tricuspid. Aortic valve regurgitation is not visualized. Aortic valve sclerosis/calcification is present, without any evidence of aortic stenosis.  5. The inferior vena cava is dilated in size with >50% respiratory variability, suggesting right atrial pressure of 8 mmHg. Comparison(s): Changes from prior study are noted. 02/04/2021: LVEF 60-65%, severe LAE. FINDINGS  Left Ventricle: Left ventricular ejection fraction, by estimation, is 65 to 70%. The left ventricle has normal function. The left ventricle has no regional wall motion abnormalities. The left ventricular internal cavity size was normal in size. There is  mild left ventricular hypertrophy. Left ventricular diastolic parameters are consistent with Grade I diastolic dysfunction (impaired relaxation).  Indeterminate filling pressures. Right Ventricle: The right ventricular size is mildly enlarged. No increase in right ventricular wall thickness. Right ventricular systolic function is low normal. There is moderately elevated pulmonary artery systolic pressure. The tricuspid regurgitant  velocity is 3.27 m/s, and with an assumed right atrial pressure of 8 mmHg, the estimated right ventricular systolic pressure is [id number] mmHg. Left Atrium: Left atrial size was normal in size. Right Atrium: Right atrial size was normal in size. Pericardium: There is no evidence of pericardial effusion. Mitral Valve: The mitral valve is grossly normal. Trivial mitral valve regurgitation. Tricuspid Valve: The tricuspid valve is grossly normal.  Tricuspid valve regurgitation is mild. Aortic Valve: The aortic valve is tricuspid. Aortic valve regurgitation is not visualized. Aortic valve sclerosis/calcification is present, without any evidence of aortic stenosis. Aortic valve mean gradient measures 7.0 mmHg. Aortic valve peak gradient measures 12.4 mmHg. Aortic valve area, by VTI measures 1.35 cm. Pulmonic Valve: The pulmonic valve was grossly normal. Pulmonic valve regurgitation is mild. Aorta: The aortic root and ascending aorta are structurally normal, with no evidence of dilitation. Venous: The inferior vena cava is dilated in size with greater than 50% respiratory variability, suggesting right atrial pressure of 8 mmHg. IAS/Shunts: No atrial level shunt detected by color flow Doppler.  LEFT VENTRICLE PLAX 2D LVIDd:         4.10 cm     Diastology LVIDs:         2.20 cm     LV e' medial:    5.22 cm/s LV PW:         1.10 cm     LV E/e' medial:  11.5 LV IVS:        1.30 cm     LV e' lateral:   11.00 cm/s LVOT diam:     2.00 cm     LV E/e' lateral: 5.4 LV SV:         47 LV SV Index:   23 LVOT Area:     3.14 cm  LV Volumes (MOD) LV vol d, MOD A4C: 55.6 ml LV vol s, MOD A4C: 23.5 ml LV SV MOD A4C:     55.6 ml RIGHT VENTRICLE             IVC  RV Basal diam:  4.10 cm     IVC diam: 2.60 cm RV Mid diam:    3.00 cm RV S prime:     10.60 cm/s TAPSE (M-mode): 2.1 cm LEFT ATRIUM             Index        RIGHT ATRIUM           Index LA diam:        3.20 cm 1.58 cm/m   RA Area:     19.00 cm LA Vol (A2C):   25.4 ml 12.57 ml/m  RA Volume:   53.10 ml  26.27 ml/m LA Vol (A4C):   17.6 ml 8.71 ml/m LA Biplane Vol: 21.6 ml 10.69 ml/m  AORTIC VALVE                     PULMONIC VALVE AV Area (Vmax):    1.17 cm      PV Vmax:       0.50 m/s AV Area (Vmean):   1.12 cm      PV Vmean:      30.800 cm/s AV Area (VTI):     1.35 cm      PV VTI:        0.096 m AV Vmax:           176.00 cm/s   PV Peak grad:  1.0 mmHg AV Vmean:          124.000 cm/s  PV Mean grad:  0.0 mmHg AV VTI:            0.352 m AV Peak Grad:      12.4 mmHg AV Mean Grad:      7.0 mmHg LVOT Vmax:         65.50 cm/s LVOT Vmean:  44.200 cm/s LVOT VTI:          0.151 m LVOT/AV VTI ratio: 0.43  AORTA Ao Root diam: 2.50 cm Ao Asc diam:  2.70 cm MITRAL VALVE                TRICUSPID VALVE MV Area (PHT): 3.42 cm     TR Peak grad:   42.8 mmHg MV Decel Time: 222 msec     TR Mean grad:   27.0 mmHg MV E velocity: 59.90 cm/s   TR Vmax:        327.00 cm/s MV A velocity: 106.00 cm/s  TR Vmean:       250.0 cm/s MV E/A ratio:  0.57                             SHUNTS                             Systemic VTI:  0.15 m                             Systemic Diam: 2.00 cm Lyman Bishop MD Electronically signed by Lyman Bishop MD Signature Date/Time: 07/05/2021/10:43:56 AM    Final     Microbiology: Results for orders placed or performed during the hospital encounter of 07/04/21  Resp Panel by RT-PCR (Flu A&B, Covid) Nasopharyngeal Swab     Status: None   Collection Time: 07/04/21  3:41 PM   Specimen: Nasopharyngeal Swab; Nasopharyngeal(NP) swabs in vial transport medium  Result Value Ref Range Status   SARS Coronavirus 2 by RT PCR NEGATIVE NEGATIVE Final    Comment: (NOTE) SARS-CoV-2 target nucleic acids are  NOT DETECTED.  The SARS-CoV-2 RNA is generally detectable in upper respiratory specimens during the acute phase of infection. The lowest concentration of SARS-CoV-2 viral copies this assay can detect is 138 copies/mL. A negative result does not preclude SARS-Cov-2 infection and should not be used as the sole basis for treatment or other patient management decisions. A negative result may occur with  improper specimen collection/handling, submission of specimen other than nasopharyngeal swab, presence of viral mutation(s) within the areas targeted by this assay, and inadequate number of viral copies(<138 copies/mL). A negative result must be combined with clinical observations, patient history, and epidemiological information. The expected result is Negative.  Fact Sheet for Patients:  EntrepreneurPulse.com.au  Fact Sheet for Healthcare Providers:  IncredibleEmployment.be  This test is no t yet approved or cleared by the [place] FDA and  has been authorized for detection and/or diagnosis of SARS-CoV-2 by FDA under an Emergency Use Authorization (EUA). This EUA will remain  in effect (meaning this test can be used) for the duration of the COVID-19 declaration under Section 564(b)(1) of the Act, 21 U.S.C.section 360bbb-3(b)(1), unless the authorization is terminated  or revoked sooner.       Influenza A by PCR NEGATIVE NEGATIVE Final   Influenza B by PCR NEGATIVE NEGATIVE Final    Comment: (NOTE) The Xpert Xpress SARS-CoV-2/FLU/RSV plus assay is intended as an aid in the diagnosis of influenza from Nasopharyngeal swab specimens and should not be used as a sole basis for treatment. Nasal washings and aspirates are unacceptable for Xpert Xpress SARS-CoV-2/FLU/RSV testing.  Fact Sheet for Patients: EntrepreneurPulse.com.au  Fact Sheet for Healthcare Providers: IncredibleEmployment.be  This test is not  yet  approved or cleared by the [place] and has been authorized for detection and/or diagnosis of SARS-CoV-2 by FDA under an Emergency Use Authorization (EUA). This EUA will remain in effect (meaning this test can be used) for the duration of the COVID-19 declaration under Section 564(b)(1) of the Act, 21 U.S.C. section 360bbb-3(b)(1), unless the authorization is terminated or revoked.  Performed at [facility], [address] [place], [place] [address]     Labs: CBC: Recent Labs  Lab 07/04/21 1201 07/05/21 0357 07/06/21 0344  WBC 9.3 6.6 10.5  NEUTROABS 6.3  --   --   HGB 13.0 13.0 11.7*  HCT 45.5 45.7 40.9  MCV 92.3 92.7 91.1  PLT 245 214 [id number]   Basic Metabolic Panel: Recent Labs  Lab 07/04/21 1201 07/04/21 2315 07/05/21 0357 07/05/21 1245 07/06/21 0344 07/07/21 0334 07/08/21 0337  NA 145  --  141  --  140 137 140  K 4.1  --  5.4* 4.7 3.8 4.6 4.5  CL 102  --  98  --  98 93* 94*  CO2 34*  --  32  --  34* 36* 39*  GLUCOSE 141*  --  294*  --  111* 251* 427*  BUN 31*  --  33*  --  39* 36* 41*  CREATININE 1.27*  --  1.05*  --  1.03* 1.05* 1.07*  CALCIUM 10.2  --  9.8  --  9.7 9.6 9.8  MG  --  1.7  --   --  1.5*  --  1.8  PHOS  --   --   --   --  2.5  --   --    Liver Function Tests: Recent Labs  Lab 07/04/21 1201 07/05/21 0357  AST 48* 30  ALT 56* 51*  ALKPHOS 158* 142*  BILITOT 0.5 0.6  PROT 7.9 7.4  ALBUMIN 3.9 3.6   CBG: Recent Labs  Lab 07/07/21 1654 07/07/21 2114 07/08/21 0805 07/08/21 1008 07/08/21 1211  GLUCAP 208* 275* 424* 382* 274*    Discharge time spent: less than 30 minutes.  Signed: Marylu Lund, MD [facility] 07/08/2021

## 2021-07-08 NOTE — Progress Notes (Signed)
[facility] returned phone call and report given to receiving nurse.  ?

## 2021-07-25 ENCOUNTER — Inpatient Hospital Stay: Payer: Medicare (Managed Care) | Admitting: Pulmonary Disease

## 2021-07-27 ENCOUNTER — Encounter: Payer: Self-pay | Admitting: Oncology

## 2021-07-30 ENCOUNTER — Encounter: Payer: Self-pay | Admitting: Oncology

## 2021-08-02 NOTE — Progress Notes (Deleted)
? ?Office Visit  ?  ?Patient Name: Courtney [name] ?Date of Encounter: 08/02/2021 ? ?PCP:  Mindi Curling, PA-C ?  ?[facility]  ?Cardiologist:  Larae Grooms, MD  ?Advanced Practice Provider:  No care team member to display ?Electrophysiologist:  None  ?   ? ?Chief Complaint  ?  ?Courtney [name] is a 76 y.o. female with a hx of CAD s/p STEMI 2011 with DES to RCA, chronic diastolic heart failure, COPD on 2 L home O2, hypertension, hyperlipidemia, DM2, obesity, dementia presents today for hospital follow-up ? ?Past Medical History  ?  ?Past Medical History:  ?Diagnosis Date  ? Anemia   ? Arthritis   ? Asthma   ? CAD (coronary artery disease)   ? COPD (chronic obstructive pulmonary disease) ([place])   ? Diabetes mellitus   ? Hyperlipidemia   ? Hypertension   ? Myocardial infarct [facility])   ? Postmenopausal atrophic vaginitis   ? Restless leg syndrome   ? ?Past Surgical History:  ?Procedure Laterality Date  ? BIOPSY  02/09/2021  ? Procedure: BIOPSY;  Surgeon: Sharyn Creamer, MD;  Location: [place];  Service: Gastroenterology;;  ? CHOLECYSTECTOMY  [phone]  ? ESOPHAGOGASTRODUODENOSCOPY (EGD) WITH PROPOFOL N/A 02/09/2021  ? Procedure: ESOPHAGOGASTRODUODENOSCOPY (EGD) WITH PROPOFOL;  Surgeon: Sharyn Creamer, MD;  Location: [place];  Service: Gastroenterology;  Laterality: N/A;  ? INCISION AND DRAINAGE ABSCESS Right 06/27/2018  ? Procedure: 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.;  Surgeon: Leighton Ruff, MD;  Location: [facility];  Service: General;  Laterality: Right;  ? INCISION AND DRAINAGE PERIRECTAL ABSCESS N/A 06/25/2018  ? Procedure: IRRIGATION AND DEBRIDEMENT PERIRECTAL ABCESS;  Surgeon: Leighton Ruff, MD;  Location: [facility];  Service: General;  Laterality: N/A;  ? IRRIGATION AND DEBRIDEMENT ABSCESS N/A 07/01/2018  ? Procedure: IRRIGATION AND DEBRIDEMENT ABSCESS;  Surgeon: Rolm Bookbinder, MD;  Location: [facility];  Service: General;  Laterality: N/A;  ? OTHER SURGICAL  HISTORY  1970's  ? hysterectomy  ? WOUND DEBRIDEMENT N/A 07/03/2018  ? Procedure: DEBRIDEMENT PELVIS WITH DRESSING CHANGE;  Surgeon: Rolm Bookbinder, MD;  Location: [facility];  Service: General;  Laterality: N/A;  ? ? ?Allergies ? ?Allergies  ?Allergen Reactions  ? Eggs Or Egg-Derived Products Swelling  ?  Listed on [facility] 07/04/21 ?Patient reports had face swelling.   ? Penicillins Other (See Comments)  ?  Did it involve swelling of the face/tongue/throat, SOB, or low BP? No ?Did it involve sudden or severe rash/hives, skin peeling, or any reaction on the inside of your mouth or nose? Yes ?Did you need to seek medical attention at a hospital or doctor's office? yes ?When did it last happen?      more than 10 years ?If all above answers are "NO", may proceed with cephalosporin use. ?  ? ? ?History of Present Illness  ?  ?Courtney [name] is a 76 y.o. female with a hx of CAD s/p STEMI 2011 with DES to RCA, chronic diastolic heart failure, COPD on 2 L home O2, hypertension, hyperlipidemia, DM2, obesity, dementia last seen while hospitalized. ? ?She has had multiple admissions for COPD exacerbation over the years including June 2021, October 2022. She was admitted November 2022 after presenting with acute on chronic diastolic heart failure treated with IV Lasix.  She had chest pain but troponins were negative.  She underwent nuclear stress test which was low risk study.  She was discharged to SNF. ? ?Repeat admission 07/04/2021 - 07/08/2021  after presenting with shortness of breath and falling out of bed.  CXR with pulmonary vascular congestion suggesting CHF exacerbation.  She was treated with IV Lasix.  Echo revealed LVEF 60 to 65%, no RWMA.  She was started on Jardiance.  She did have elevated troponin thought to be consistent with demand ischemia.  Additionally with AKI.  She was discharged on prednisone as well as oral diuretics. ? ?She presents today for follow-up. *** ? ?EKGs/Labs/Other Studies Reviewed:  ? ?The following  studies were reviewed today: ? ? ?Stress test 03/2021 ?  The study is normal. The study is low risk. ?  No ST deviation was noted. Arrhythmias during stress: frequent PVCs. Arrhythmias during recovery: frequent PVCs. ?  LV perfusion is normal. There is no evidence of ischemia. There is no evidence of infarction. ?  Left ventricular function is normal. Nuclear stress EF: 57 %. The left ventricular ejection fraction is normal (55-65%). End diastolic cavity size is normal. ?  ?This result has not been signed. Information might be incomplete. ? ?Echo 07/05/2021 ? ? 1. Left ventricular ejection fraction, by estimation, is 65 to 70%. The  ?left ventricle has normal function. The left ventricle has no regional  ?wall motion abnormalities. There is mild left ventricular hypertrophy.  ?Left ventricular diastolic parameters  ?are consistent with Grade I diastolic dysfunction (impaired relaxation).  ? 2. Right ventricular systolic function is low normal. The right  ?ventricular size is mildly enlarged. There is moderately elevated  ?pulmonary artery systolic pressure. The estimated right ventricular  ?systolic pressure is [id number] mmHg.  ? 3. The mitral valve is grossly normal. Trivial mitral valve  ?regurgitation.  ? 4. The aortic valve is tricuspid. Aortic valve regurgitation is not  ?visualized. Aortic valve sclerosis/calcification is present, without any  ?evidence of aortic stenosis.  ? 5. The inferior vena cava is dilated in size with >50% respiratory  ?variability, suggesting right atrial pressure of 8 mmHg.  ? ?Comparison(s): Changes from prior study are noted. 02/04/2021: LVEF 60-65%,  ?severe LAE.  ? ?EKG:  EKG is  ordered today.  The ekg ordered today demonstrates *** ? ?Recent Labs: ?02/04/2021: TSH 0.200 ?07/04/2021: B Natriuretic Peptide 1,054.3 ?07/05/2021: ALT 51 ?07/06/2021: Hemoglobin 11.7; Platelets 250 ?07/08/2021: BUN 41; Creatinine, Ser 1.07; Magnesium 1.8; Potassium 4.5; Sodium 140  ?Recent Lipid Panel ?   ?Component  Value Date/Time  ? CHOL 82 06/26/2018 0745  ? CHOL 210 (H) 10/02/2016 0856  ? TRIG 75 06/26/2018 0745  ? HDL 13 (L) 06/26/2018 0745  ? HDL 46 10/02/2016 0856  ? CHOLHDL 6.3 06/26/2018 0745  ? VLDL 15 06/26/2018 0745  ? [place] 54 06/26/2018 0745  ? [place] 137 (H) 10/02/2016 0856  ? ?Home Medications  ? ?No outpatient medications have been marked as taking for the 08/03/21 encounter (Appointment) with Loel Dubonnet, NP.  ?  ? ?Review of Systems  ?    ?All other systems reviewed and are otherwise negative except as noted above. ? ?Physical Exam  ?  ?VS:  There were no vitals taken for this visit. , BMI There is no height or weight on file to calculate BMI. ? ?Wt Readings from Last 3 Encounters:  ?07/08/21 219 lb 5.7 oz (99.5 kg)  ?03/08/21 235 lb 14.3 oz (107 kg)  ?02/11/21 235 lb 10.8 oz (106.9 kg)  ?  ?GEN: Well nourished, well developed, in no acute distress. ?HEENT: normal. ?Neck: Supple, no JVD, carotid bruits, or masses. ?Cardiac: ***RRR, no murmurs, rubs, or  gallops. No clubbing, cyanosis, edema.  ***Radials/PT 2+ and equal bilaterally.  ?Respiratory:  ***Respirations regular and unlabored, clear to auscultation bilaterally. ?GI: Soft, nontender, nondistended. ?MS: No deformity or atrophy. ?Skin: Warm and dry, no rash. ?Neuro:  Strength and sensation are intact. ?Psych: Normal affect. ? ?Assessment & Plan  ?  ?Chronic diastolic heart failure -  ? ?CAD s/p DES to RCA in 2011 -  ? ?Hypertension -  ? ?HLD, LDL goal less than 70-  ? ?AKI -noted during recent admission.  Baseline creatinine 0.7-0.8. *** ? ?PVCs/ventricular bigeminy -  ? ?COPD -  ? ?DM2 -  ? ?Disposition: Follow up {follow up:15908} with Larae Grooms, MD or APP. ? ?Signed, ?Loel Dubonnet, NP ?08/02/2021, 11:53 AM ?[place] ?

## 2021-08-03 ENCOUNTER — Ambulatory Visit (HOSPITAL_BASED_OUTPATIENT_CLINIC_OR_DEPARTMENT_OTHER): Payer: Medicare (Managed Care) | Admitting: Family

## 2021-08-09 NOTE — Telephone Encounter (Signed)
Routing this to Dr. Val Eagle for him to review. ?

## 2021-08-09 NOTE — Telephone Encounter (Signed)
She will need evaluation at a center where they may be able to accommodate her for the test. ?Only resource I have to have for testing done is to [facility] sleep lab. ? ?Will recommend having patient evaluated at [facility] or [facility] ?

## 2021-08-10 ENCOUNTER — Telehealth: Payer: Self-pay | Admitting: Pulmonary Disease

## 2021-08-10 NOTE — Telephone Encounter (Signed)
ATC Dorena Bodo- NP at [facility]) at [phone] to let her know that Dr. Wynona Neat is recommending that patient be evaluated at [facility] or [facility]. Advised her to call back if we need to do anything on our end to get patient evaluated at one of those facilities.  ?

## 2021-08-11 ENCOUNTER — Encounter: Payer: Self-pay | Admitting: Oncology

## 2021-08-11 NOTE — Telephone Encounter (Signed)
Called and spoke with Whitney. She stated that's she was calling from the facility the patient is currently at. She was calling in regards to a sleep the patient was supposed to have a few months ago. She stated that she was not with the patient today and to call [phone] and ask to speak with Maryagnes Amos.  ? ?Called and spoke with Irving Burton. She was confused about the call as well. She stated that the patient hasn't expressed any concerns to having sleep issues. She does take a few naps during the day but nothing excessive. She does not believe that the patient will need a sleep study right now.  ? ?She is aware she can call us back if anything is needed.  ? ?Nothing further needed at time of call.  ? ? ? ?

## 2021-08-12 NOTE — Telephone Encounter (Signed)
Called and spoke with Dorena Bodo, NP letting her know the info per AO and she verbalized understanding. Nothing further needed. ?

## 2022-03-27 IMAGING — CT CT CERVICAL SPINE W/O CM
4 of 8 series · 11 of 33 positions shown, 12 images · non-contrast
Comparison: MRI head very 05/20/2004

CLINICAL DATA: Head trauma, minor (Age >= 65y); Neck trauma (Age >=
65y)



[Series 4: c spine soft · axial · 0.26mm/px · z∈[-246,-198]mm · 2 of 73 slices shown]
[im 25/73  soft-tissue]
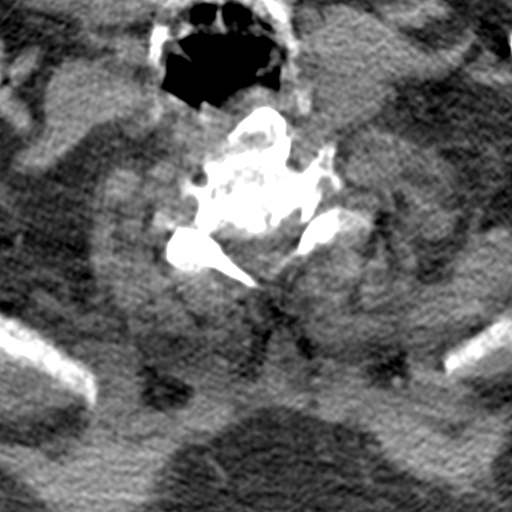
[im 49/73  soft-tissue]
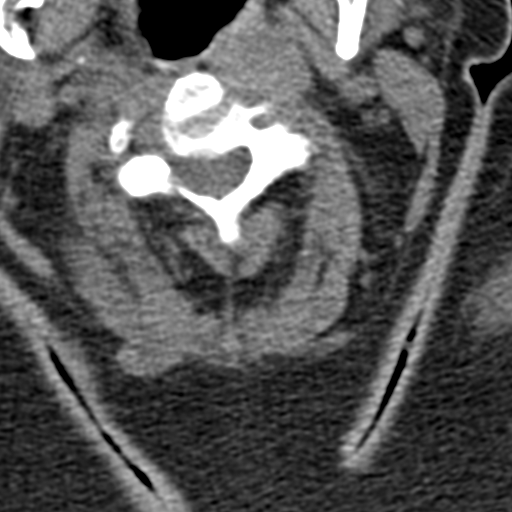

[Series 6: orthogonal bone · axial · 0.19mm/px · z∈[-283,-232]mm · 2 of 89 slices shown, 3 images]
[im 30/89  soft-tissue]
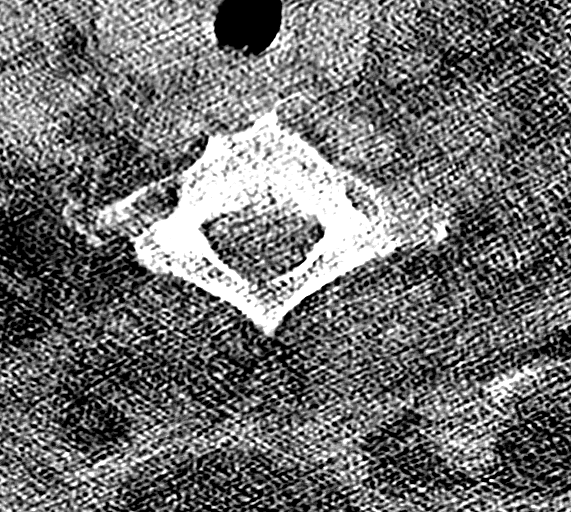
[im 30/89  bone]
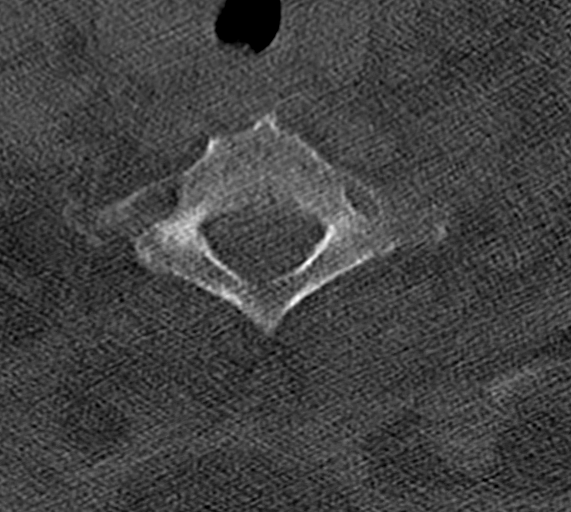
[im 59/89  bone]
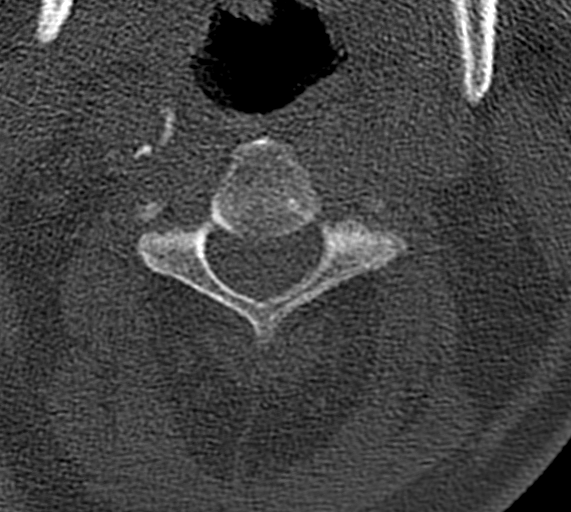

[Series 7: coronal bone · coronal · 0.21mm/px · 2 of 49 slices shown]
[im 6/49  bone]
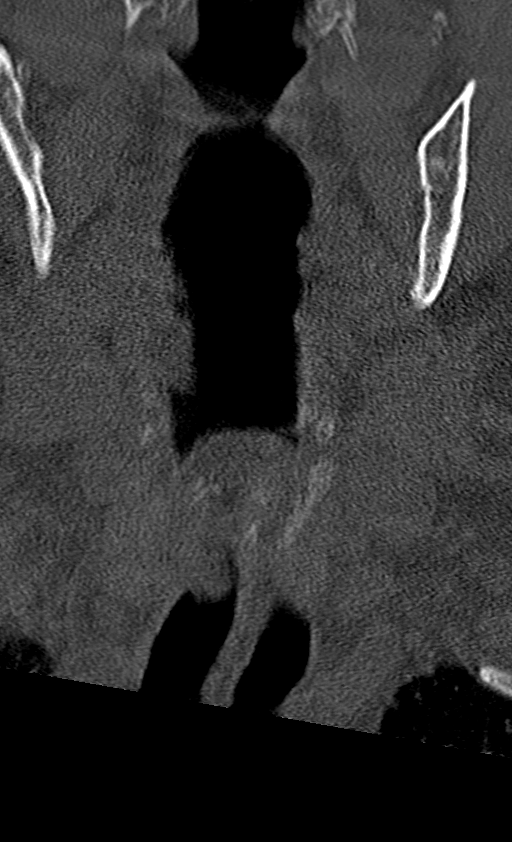
[im 27/49  bone]
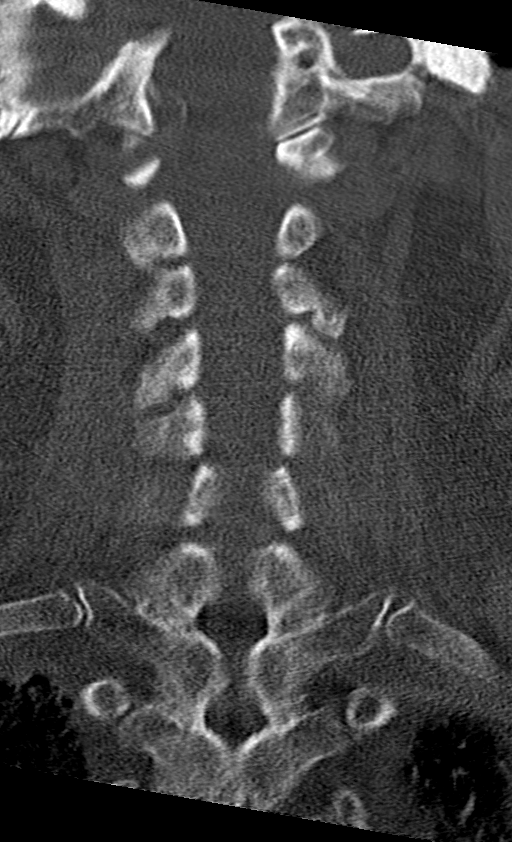

[Series 8: sagittal bone · sagittal · 0.19mm/px · 5 of 54 slices shown]
[im 9/54  bone]
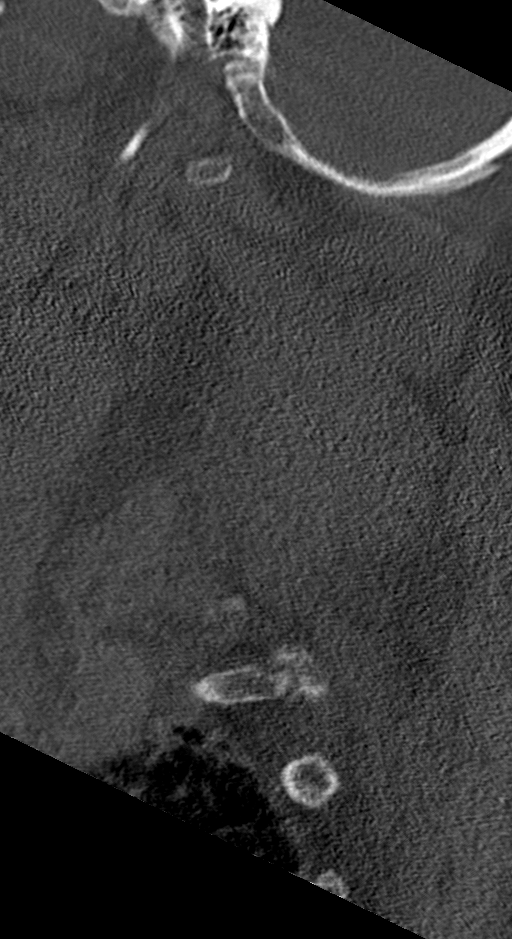
[im 18/54  bone]
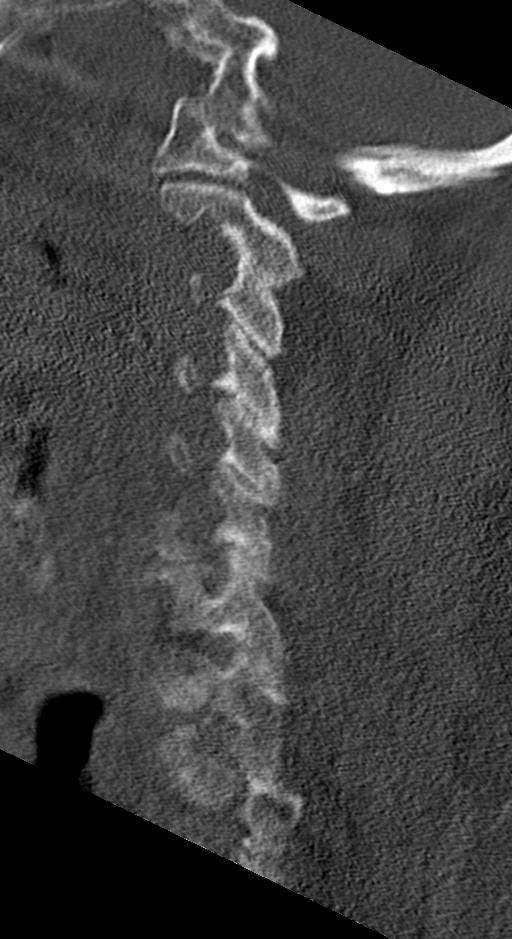
[im 27/54  bone]
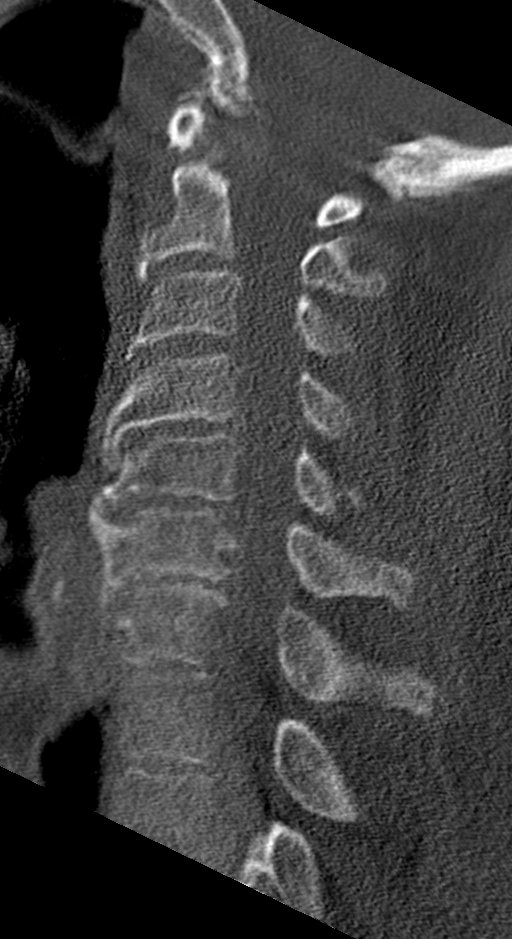
[im 36/54  bone]
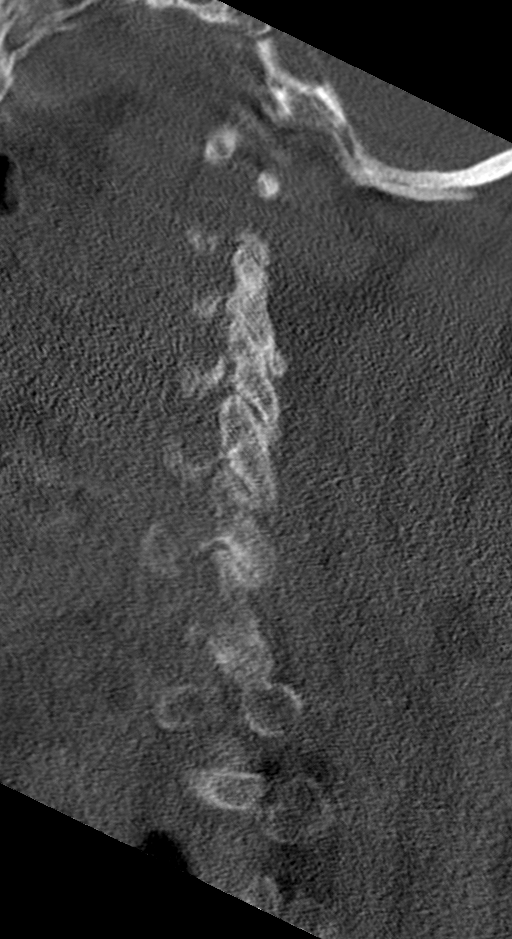
[im 45/54  bone]
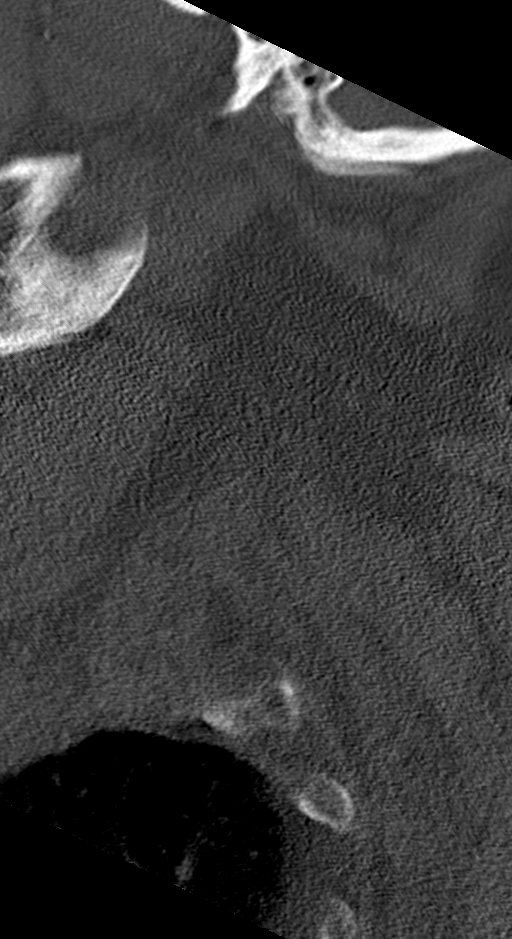

[11 of 33 positions shown; findings below may reference images not displayed]

FINDINGS: CT HEAD FINDINGS

Brain: No evidence of acute large vascular territory infarction,
hemorrhage, hydrocephalus, extra-axial collection or mass
lesion/mass effect. Areas of hypoattenuation within the right
greater than left white matter, nonspecific but compatible with
chronic microvascular ischemic disease. Ex vacuo ventricular
dilation

Vascular: Calcific intracranial atherosclerosis. No hyperdense
vessel identified

Skull: No evidence of acute fracture.

Sinuses/Orbits: Minimal paranasal sinus mucosal thickening.
Unremarkable orbits.

Other: Trace bilateral mastoid effusions

CT CERVICAL SPINE FINDINGS

Limited study due to beam hardening from patient body habitus.
Within this limitation:

Alignment: Straightening.  No substantial sagittal subluxation.

Skull base and vertebrae: Vertebral body heights are maintained. No
evidence of acute fracture.

Soft tissues and spinal canal: Motion limited without evidence of
large canal hematoma or obvious prevertebral edema.

Disc levels: Multilevel facet and uncovertebral hypertrophy with
resulting varying degrees of neural foraminal stenosis. Moderate
multilevel degenerative disc disease, including posterior
disc/osteophyte complexes and bridging anterior osteophytes.
Craniocervical degenerative change.

Upper chest: Biapical pleuroparenchymal scarring with otherwise
clear lung apices.
IMPRESSION: CT head:

1. No evidence of acute intracranial abnormality.
2. Chronic microvascular ischemic disease.

CT cervical spine:

1. Limited study without evidence of acute fracture or traumatic
malalignment.
2. Multilevel degenerative change as detailed above.

## 2023-01-02 ENCOUNTER — Encounter: Payer: Self-pay | Admitting: Oncology

## 2023-01-10 ENCOUNTER — Ambulatory Visit: Payer: Medicare (Managed Care) | Admitting: Podiatry

## 2023-03-17 ENCOUNTER — Encounter (HOSPITAL_COMMUNITY): Payer: Self-pay | Admitting: Emergency Medicine

## 2023-03-17 ENCOUNTER — Other Ambulatory Visit: Payer: Self-pay

## 2023-03-17 ENCOUNTER — Emergency Department (HOSPITAL_COMMUNITY): Payer: Medicare (Managed Care)

## 2023-03-17 ENCOUNTER — Encounter: Payer: Self-pay | Admitting: Oncology

## 2023-03-17 ENCOUNTER — Emergency Department (HOSPITAL_COMMUNITY)
Admission: EM | Admit: 2023-03-17 | Discharge: 2023-03-17 | Disposition: A | Discharge status: Home / Self Care | Payer: Medicare (Managed Care) | Attending: Emergency Medicine | Admitting: Emergency Medicine

## 2023-03-17 DIAGNOSIS — J449 Chronic obstructive pulmonary disease, unspecified: Secondary | ICD-10-CM | POA: Diagnosis not present

## 2023-03-17 DIAGNOSIS — I700 Atherosclerosis of aorta: Secondary | ICD-10-CM | POA: Diagnosis not present

## 2023-03-17 DIAGNOSIS — R0989 Other specified symptoms and signs involving the circulatory and respiratory systems: Secondary | ICD-10-CM | POA: Diagnosis not present

## 2023-03-17 DIAGNOSIS — I2510 Atherosclerotic heart disease of native coronary artery without angina pectoris: Secondary | ICD-10-CM | POA: Diagnosis not present

## 2023-03-17 DIAGNOSIS — Z79899 Other long term (current) drug therapy: Secondary | ICD-10-CM | POA: Diagnosis not present

## 2023-03-17 DIAGNOSIS — R079 Chest pain, unspecified: Secondary | ICD-10-CM | POA: Diagnosis present

## 2023-03-17 DIAGNOSIS — Z7982 Long term (current) use of aspirin: Secondary | ICD-10-CM | POA: Insufficient documentation

## 2023-03-17 DIAGNOSIS — Z7951 Long term (current) use of inhaled steroids: Secondary | ICD-10-CM | POA: Diagnosis not present

## 2023-03-17 DIAGNOSIS — R0789 Other chest pain: Secondary | ICD-10-CM | POA: Insufficient documentation

## 2023-03-17 DIAGNOSIS — I10 Essential (primary) hypertension: Secondary | ICD-10-CM | POA: Diagnosis not present

## 2023-03-17 LAB — COMPREHENSIVE METABOLIC PANEL
ALT: 11 U/L (ref 0–44)
AST: 16 U/L (ref 15–41)
Albumin: 3.2 g/dL — ABNORMAL LOW (ref 3.5–5.0)
Alkaline Phosphatase: 113 U/L (ref 38–126)
Anion gap: 11 (ref 5–15)
BUN: 17 mg/dL (ref 8–23)
CO2: 27 mmol/L (ref 22–32)
Calcium: 9.1 mg/dL (ref 8.9–10.3)
Chloride: 102 mmol/L (ref 98–111)
Creatinine, Ser: 0.9 mg/dL (ref 0.44–1.00)
GFR, Estimated: >60 mL/min (ref >60)
Glucose, Bld: 115 mg/dL — ABNORMAL HIGH (ref 70–99)
Potassium: 4.5 mmol/L (ref 3.5–5.1)
Sodium: 140 mmol/L (ref 135–145)
Total Bilirubin: 0.8 mg/dL (ref <1.2)
Total Protein: 5.8 g/dL — ABNORMAL LOW (ref 6.5–8.1)

## 2023-03-17 LAB — CBC WITH DIFFERENTIAL/PLATELET
Abs Immature Granulocytes: 0.04 10*3/uL (ref 0.00–0.07)
Basophils Absolute: 0 10*3/uL (ref 0.0–0.1)
Basophils Relative: 0 %
Eosinophils Absolute: 0.3 10*3/uL (ref 0.0–0.5)
Eosinophils Relative: 2 %
HCT: 31.7 % — ABNORMAL LOW (ref 36.0–46.0)
Hemoglobin: 9.1 g/dL — ABNORMAL LOW (ref 12.0–15.0)
Immature Granulocytes: 0 %
Lymphocytes Relative: 15 %
Lymphs Abs: 1.9 10*3/uL (ref 0.7–4.0)
MCH: 24.7 pg — ABNORMAL LOW (ref 26.0–34.0)
MCHC: 28.7 g/dL — ABNORMAL LOW (ref 30.0–36.0)
MCV: 85.9 fL (ref 80.0–100.0)
Monocytes Absolute: 0.9 10*3/uL (ref 0.1–1.0)
Monocytes Relative: 7 %
Neutro Abs: 9.5 10*3/uL — ABNORMAL HIGH (ref 1.7–7.7)
Neutrophils Relative %: 76 %
Platelets: 192 10*3/uL (ref 150–400)
RBC: 3.69 MIL/uL — ABNORMAL LOW (ref 3.87–5.11)
RDW: 13.8 % (ref 11.5–15.5)
WBC: 12.6 10*3/uL — ABNORMAL HIGH (ref 4.0–10.5)
nRBC: 0 % (ref 0.0–0.2)

## 2023-03-17 LAB — BRAIN NATRIURETIC PEPTIDE: B Natriuretic Peptide: 477.6 pg/mL — ABNORMAL HIGH (ref 0.0–100.0)

## 2023-03-17 LAB — TROPONIN I (HIGH SENSITIVITY)
Troponin I (High Sensitivity): 18 ng/L — ABNORMAL HIGH (ref <18)
Troponin I (High Sensitivity): 20 ng/L — ABNORMAL HIGH (ref <18)

## 2023-03-17 NOTE — ED Notes (Signed)
This RN attempted IV access & blood draw x2 without success. Stat IV team consult placed.

## 2023-03-17 NOTE — Progress Notes (Signed)
No IV meds ordered at this time. Per secure chat communication. RN will contact phlebotomy for lab draw and place IV Team consult if needed.

## 2023-03-17 NOTE — ED Notes (Signed)
Attempted IV twice unsuccessfully.  

## 2023-03-17 NOTE — ED Notes (Signed)
[facility] requested for pts transport back to [facility]

## 2023-03-17 NOTE — ED Notes (Signed)
Attempted report to [facility], no answer.

## 2023-03-17 NOTE — ED Notes (Signed)
Husband Kahleesi Tetter [phone]([phone])/ [phone]([phone]) would like an update asap

## 2023-03-17 NOTE — ED Notes (Signed)
Pt discharged with [facility] transport. Pt in NAD. Respirations even & unlabored. Skin warm & dry. Discharge instructions reviewed with facility staff and pts husband. No needs expressed at time of discharge.

## 2023-03-17 NOTE — Discharge Instructions (Signed)
Follow-up with your doctor this week for recheck. 

## 2023-03-17 NOTE — ED Provider Notes (Signed)
[place] EMERGENCY DEPARTMENT AT [facility] Provider Note   CSN: [id number] Arrival date & time: 03/17/23  1701     History {Add pertinent medical, surgical, social history, OB history to HPI:1} Chief Complaint  Patient presents with   Chest Pain    Courtney [name] is a 77 y.o. female.  Patient was complaining of chest pain earlier today.  She has a history of coronary artery disease and COPD and hypertension.  She states that at nursing home  The history is provided by the patient and medical records. No language interpreter was used.  Chest Pain Pain location:  L chest Pain quality: aching   Pain radiates to:  Does not radiate Pain severity:  Mild Onset quality:  Sudden Timing:  Intermittent Progression:  Waxing and waning Chronicity:  New Context: not breathing   Relieved by:  Nothing Worsened by:  Nothing Associated symptoms: no abdominal pain, no back pain, no cough, no fatigue and no headache        Home Medications Prior to Admission medications   Medication Sig Start Date End Date Taking? Authorizing Provider  acetaminophen (TYLENOL) 325 MG tablet Take 650 mg by mouth every 8 (eight) hours as needed (pain).    [provider]  amLODipine (NORVASC) 10 MG tablet Take 1 tablet (10 mg total) by mouth daily. 07/09/18   Dhungel, Nishant, MD  arformoterol (BROVANA) 15 MCG/2ML NEBU Take 2 mLs (15 mcg total) by nebulization 2 (two) times daily. Patient not taking: Reported on 07/05/2021 10/21/19   Rhetta Mura, MD  aspirin EC 81 MG EC tablet Take 1 tablet (81 mg total) by mouth daily. 07/09/18   Dhungel, Theda Belfast, MD  atorvastatin (LIPITOR) 80 MG tablet Take 80 mg by mouth at bedtime.    [provider]  budesonide (PULMICORT) 0.25 MG/2ML nebulizer solution Take 0.25 mg by nebulization 2 (two) times daily. Patient not taking: Reported on 07/05/2021    [provider]  Cholecalciferol (VITAMIN D-3) 125 MCG (5000 UT) TABS Take  5,000 Units by mouth daily.    [provider]  empagliflozin (JARDIANCE) 10 MG TABS tablet Take 1 tablet (10 mg total) by mouth daily before breakfast. 07/08/21   Jerald Kief, MD  ferrous sulfate 325 (65 FE) MG tablet Take 1 tablet (325 mg total) by mouth 2 (two) times daily with a meal. Patient taking differently: Take 325 mg by mouth daily. 10/21/19   Rhetta Mura, MD  fluticasone (FLONASE) 50 MCG/ACT nasal spray Place 1 spray into both nostrils daily. 09/19/19   [provider]  furosemide (LASIX) 40 MG tablet Take 1 tablet (40 mg total) by mouth 2 (two) times daily. 07/08/21 08/07/21  Jerald Kief, MD  glucagon (GLUCAGEN HYPOKIT) 1 MG SOLR injection Inject 1 mg into the muscle daily as needed for low blood sugar.    [provider]  Glycopyrrolate-Formoterol (BEVESPI AEROSPHERE) 9-4.8 MCG/ACT AERO Inhale 2 puffs into the lungs 2 (two) times daily.    [provider]  hydrOXYzine (ATARAX) 25 MG tablet Take 25 mg by mouth 2 (two) times daily. For itching    [provider]  insulin aspart (NOVOLOG FLEXPEN) 100 UNIT/ML FlexPen Inject 0-17 Units into the skin See admin instructions. Inject 0-16 units subcutaneously before meals and at bedtime per sliding scale: CBG 0-150 0 units, 151-200 4 units, 201-250 6 units, 251-300 8 units, 301-350 10 units, 351-400 14 units, 401-450 16 units, >450 contact MD    [provider]  insulin glargine (LANTUS) 100 unit/mL SOPN Inject 21 Units into the skin 2 (two) times daily.    [provider]  ipratropium-albuterol (DUONEB) 0.5-2.5 (3) MG/3ML SOLN Take 3 mLs by nebulization every 6 (six) hours as needed. Patient taking differently: Take 3 mLs by nebulization 3 (three) times daily. 07/08/18   Dhungel, Nishant, MD  melatonin 3 MG TABS tablet Take 1 tablet (3 mg total) by mouth at bedtime. Patient taking differently: Take 6 mg by mouth at bedtime. 10/21/19   Rhetta Mura, MD  memantine  (NAMENDA) 5 MG tablet Take 5 mg by mouth at bedtime. Patient not taking: Reported on 07/05/2021    [provider]  metoprolol succinate (TOPROL-XL) 25 MG 24 hr tablet Take 25 mg by mouth daily.    [provider]  nitroGLYCERIN (NITROSTAT) 0.4 MG SL tablet Place 0.4 mg under the tongue every 5 (five) minutes as needed for chest pain. 03/06/21   [provider]  Nutritional Supplement LIQD Take 1 each by mouth daily. Health Shake    [provider]  OXYGEN Inhale 2 L into the lungs continuous.    [provider]  pantoprazole (PROTONIX) 40 MG tablet Take 40 mg by mouth 2 (two) times daily.    [provider]  polyethylene glycol (MIRALAX / GLYCOLAX) 17 g packet Take 17 g by mouth daily.    [provider]  saccharomyces boulardii (FLORASTOR) 250 MG capsule Take 250 mg by mouth 2 (two) times daily.    [provider]  senna (SENOKOT) 8.6 MG TABS tablet Take 2 tablets by mouth 2 (two) times daily.    [provider]  sertraline (ZOLOFT) 50 MG tablet Take 50 mg by mouth daily.     [provider]      Allergies    Egg-derived products and Penicillins    Review of Systems   Review of Systems  Constitutional:  Negative for appetite change and fatigue.  HENT:  Negative for congestion, ear discharge and sinus pressure.   Eyes:  Negative for discharge.  Respiratory:  Negative for cough.   Cardiovascular:  Positive for chest pain.  Gastrointestinal:  Negative for abdominal pain and diarrhea.  Genitourinary:  Negative for frequency and hematuria.  Musculoskeletal:  Negative for back pain.  Skin:  Negative for rash.  Neurological:  Negative for seizures and headaches.  Psychiatric/Behavioral:  Negative for hallucinations.     Physical Exam Updated Vital Signs BP 123/62   Pulse 71   Temp (!) 97.3 F (36.3 C) (Oral)   Resp 20   SpO2 100%  Physical Exam Vitals and nursing note reviewed.  Constitutional:       Appearance: She is well-developed.  HENT:     Head: Normocephalic.     Nose: Nose normal.  Eyes:     General: No scleral icterus.    Conjunctiva/sclera: Conjunctivae normal.  Neck:     Thyroid: No thyromegaly.  Cardiovascular:     Rate and Rhythm: Normal rate and regular rhythm.     Heart sounds: No murmur heard.    No friction rub. No gallop.  Pulmonary:     Breath sounds: No stridor. No wheezing or rales.  Chest:     Chest wall: No tenderness.  Abdominal:     General: There is no distension.     Tenderness: There is no abdominal tenderness. There is no rebound.  Musculoskeletal:        General: Normal range of motion.  Cervical back: Neck supple.  Lymphadenopathy:     Cervical: No cervical adenopathy.  Skin:    Findings: No erythema or rash.  Neurological:     Mental Status: She is alert and oriented to person, place, and time.     Motor: No abnormal muscle tone.     Coordination: Coordination normal.  Psychiatric:        Behavior: Behavior normal.     ED Results / Procedures / Treatments   Labs (all labs ordered are listed, but only abnormal results are displayed) Labs Reviewed  COMPREHENSIVE METABOLIC PANEL - Abnormal; Notable for the following components:      Result Value   Glucose, Bld 115 (*)    Total Protein 5.8 (*)    Albumin 3.2 (*)    All other components within normal limits  CBC WITH DIFFERENTIAL/PLATELET - Abnormal; Notable for the following components:   WBC 12.6 (*)    RBC 3.69 (*)    Hemoglobin 9.1 (*)    HCT 31.7 (*)    MCH 24.7 (*)    MCHC 28.7 (*)    Neutro Abs 9.5 (*)    All other components within normal limits  TROPONIN I (HIGH SENSITIVITY) - Abnormal; Notable for the following components:   Troponin I (High Sensitivity) 18 (*)    All other components within normal limits  CBC WITH DIFFERENTIAL/PLATELET  CBC  BRAIN NATRIURETIC PEPTIDE  TROPONIN I (HIGH SENSITIVITY)    EKG None  Radiology DG Chest Port 1 View  Result  Date: 03/17/2023 CLINICAL DATA:  Chest pain EXAM: PORTABLE CHEST 1 VIEW COMPARISON:  July 04, 2021 FINDINGS: Normal for technique cardiac silhouette. Calcific atherosclerotic disease of the aorta. Left lung base possible atelectasis. Mild pulmonary vascular congestion. Normal osseous structures. IMPRESSION: 1. Mild pulmonary vascular congestion. 2. Left lung base possible atelectasis. Electronically Signed   By: Ted Mcalpine M.D.   On: 03/17/2023 18:08    Procedures Procedures  {Document cardiac monitor, telemetry assessment procedure when appropriate:1}  Medications Ordered in ED Medications - No data to display  ED Course/ Medical Decision Making/ A&P  EKG shows no acute changes.,  Chest x-ray shows mild pulmonary vascular congestion {   Click here for ABCD2, HEART and other calculatorsREFRESH Note before signing :1}                              Medical Decision Making Amount and/or Complexity of Data Reviewed Labs: ordered. Radiology: ordered. ECG/medicine tests: ordered.     {Document critical care time when appropriate:1} {Document review of labs and clinical decision tools ie heart score, Chads2Vasc2 etc:1}  {Document your independent review of radiology images, and any outside records:1} {Document your discussion with family members, caretakers, and with consultants:1} {Document social determinants of health affecting pt's care:1} {Document your decision making why or why not admission, treatments were needed:1} Final Clinical Impression(s) / ED Diagnoses Final diagnoses:  None    Rx / DC Orders ED Discharge Orders     None

## 2023-03-17 NOTE — ED Triage Notes (Signed)
Pt here from Nursing home with c/o centralized chest pain and some slight sob , pt received 1 nitotab  and 324mg  asa from em[facility] pt is on 4 liters n/c otc

## 2023-03-17 NOTE — ED Notes (Signed)
Pt cleansed of urine and brief changed. Pt Repositioned in bed. Pts husband updated of pts discharge.

## 2023-03-26 ENCOUNTER — Inpatient Hospital Stay (HOSPITAL_COMMUNITY): Payer: Medicare (Managed Care)

## 2023-03-26 ENCOUNTER — Encounter: Payer: Self-pay | Admitting: Oncology

## 2023-03-26 ENCOUNTER — Emergency Department (HOSPITAL_COMMUNITY): Payer: Medicare (Managed Care)

## 2023-03-26 ENCOUNTER — Other Ambulatory Visit: Payer: Self-pay

## 2023-03-26 ENCOUNTER — Inpatient Hospital Stay (HOSPITAL_COMMUNITY)
Admission: EM | Admit: 2023-03-26 | Discharge: 2023-04-03 | DRG: 208 | Disposition: A | Discharge status: Skilled Nursing Facility | Payer: Medicare (Managed Care) | Source: Skilled Nursing Facility | Attending: Internal Medicine | Admitting: Internal Medicine

## 2023-03-26 ENCOUNTER — Encounter (HOSPITAL_COMMUNITY): Payer: Self-pay

## 2023-03-26 DIAGNOSIS — J9601 Acute respiratory failure with hypoxia: Secondary | ICD-10-CM | POA: Diagnosis not present

## 2023-03-26 DIAGNOSIS — Z91012 Allergy to eggs: Secondary | ICD-10-CM

## 2023-03-26 DIAGNOSIS — J9811 Atelectasis: Secondary | ICD-10-CM | POA: Diagnosis present

## 2023-03-26 DIAGNOSIS — J449 Chronic obstructive pulmonary disease, unspecified: Secondary | ICD-10-CM | POA: Diagnosis present

## 2023-03-26 DIAGNOSIS — G4733 Obstructive sleep apnea (adult) (pediatric): Secondary | ICD-10-CM | POA: Diagnosis present

## 2023-03-26 DIAGNOSIS — R5381 Other malaise: Secondary | ICD-10-CM | POA: Diagnosis present

## 2023-03-26 DIAGNOSIS — Z7982 Long term (current) use of aspirin: Secondary | ICD-10-CM

## 2023-03-26 DIAGNOSIS — Z9049 Acquired absence of other specified parts of digestive tract: Secondary | ICD-10-CM

## 2023-03-26 DIAGNOSIS — R570 Cardiogenic shock: Secondary | ICD-10-CM | POA: Diagnosis present

## 2023-03-26 DIAGNOSIS — I130 Hypertensive heart and chronic kidney disease with heart failure and stage 1 through stage 4 chronic kidney disease, or unspecified chronic kidney disease: Secondary | ICD-10-CM | POA: Diagnosis present

## 2023-03-26 DIAGNOSIS — J189 Pneumonia, unspecified organism: Secondary | ICD-10-CM | POA: Diagnosis not present

## 2023-03-26 DIAGNOSIS — J9621 Acute and chronic respiratory failure with hypoxia: Secondary | ICD-10-CM | POA: Diagnosis present

## 2023-03-26 DIAGNOSIS — I493 Ventricular premature depolarization: Secondary | ICD-10-CM | POA: Diagnosis not present

## 2023-03-26 DIAGNOSIS — E0865 Diabetes mellitus due to underlying condition with hyperglycemia: Secondary | ICD-10-CM | POA: Diagnosis not present

## 2023-03-26 DIAGNOSIS — R7989 Other specified abnormal findings of blood chemistry: Secondary | ICD-10-CM

## 2023-03-26 DIAGNOSIS — I441 Atrioventricular block, second degree: Secondary | ICD-10-CM | POA: Diagnosis present

## 2023-03-26 DIAGNOSIS — N179 Acute kidney failure, unspecified: Secondary | ICD-10-CM | POA: Diagnosis not present

## 2023-03-26 DIAGNOSIS — Z955 Presence of coronary angioplasty implant and graft: Secondary | ICD-10-CM

## 2023-03-26 DIAGNOSIS — I5042 Chronic combined systolic (congestive) and diastolic (congestive) heart failure: Secondary | ICD-10-CM | POA: Diagnosis present

## 2023-03-26 DIAGNOSIS — J441 Chronic obstructive pulmonary disease with (acute) exacerbation: Secondary | ICD-10-CM | POA: Diagnosis present

## 2023-03-26 DIAGNOSIS — Z87891 Personal history of nicotine dependence: Secondary | ICD-10-CM

## 2023-03-26 DIAGNOSIS — E870 Hyperosmolality and hypernatremia: Secondary | ICD-10-CM | POA: Diagnosis not present

## 2023-03-26 DIAGNOSIS — Z794 Long term (current) use of insulin: Secondary | ICD-10-CM | POA: Diagnosis not present

## 2023-03-26 DIAGNOSIS — R569 Unspecified convulsions: Secondary | ICD-10-CM | POA: Diagnosis not present

## 2023-03-26 DIAGNOSIS — Z7951 Long term (current) use of inhaled steroids: Secondary | ICD-10-CM

## 2023-03-26 DIAGNOSIS — Z79899 Other long term (current) drug therapy: Secondary | ICD-10-CM | POA: Diagnosis not present

## 2023-03-26 DIAGNOSIS — G2581 Restless legs syndrome: Secondary | ICD-10-CM | POA: Diagnosis present

## 2023-03-26 DIAGNOSIS — K7200 Acute and subacute hepatic failure without coma: Secondary | ICD-10-CM | POA: Diagnosis present

## 2023-03-26 DIAGNOSIS — I469 Cardiac arrest, cause unspecified: Principal | ICD-10-CM | POA: Diagnosis present

## 2023-03-26 DIAGNOSIS — F03A3 Unspecified dementia, mild, with mood disturbance: Secondary | ICD-10-CM | POA: Diagnosis present

## 2023-03-26 DIAGNOSIS — E876 Hypokalemia: Secondary | ICD-10-CM | POA: Diagnosis present

## 2023-03-26 DIAGNOSIS — E049 Nontoxic goiter, unspecified: Secondary | ICD-10-CM | POA: Diagnosis present

## 2023-03-26 DIAGNOSIS — D649 Anemia, unspecified: Secondary | ICD-10-CM | POA: Diagnosis present

## 2023-03-26 DIAGNOSIS — R9389 Abnormal findings on diagnostic imaging of other specified body structures: Secondary | ICD-10-CM | POA: Diagnosis not present

## 2023-03-26 DIAGNOSIS — F0390 Unspecified dementia without behavioral disturbance: Secondary | ICD-10-CM | POA: Diagnosis present

## 2023-03-26 DIAGNOSIS — I252 Old myocardial infarction: Secondary | ICD-10-CM

## 2023-03-26 DIAGNOSIS — G9349 Other encephalopathy: Secondary | ICD-10-CM | POA: Diagnosis present

## 2023-03-26 DIAGNOSIS — Z880 Allergy status to penicillin: Secondary | ICD-10-CM

## 2023-03-26 DIAGNOSIS — J45909 Unspecified asthma, uncomplicated: Secondary | ICD-10-CM | POA: Diagnosis not present

## 2023-03-26 DIAGNOSIS — F03A4 Unspecified dementia, mild, with anxiety: Secondary | ICD-10-CM | POA: Diagnosis present

## 2023-03-26 DIAGNOSIS — Z90710 Acquired absence of both cervix and uterus: Secondary | ICD-10-CM

## 2023-03-26 DIAGNOSIS — J690 Pneumonitis due to inhalation of food and vomit: Principal | ICD-10-CM | POA: Diagnosis present

## 2023-03-26 DIAGNOSIS — Z6841 Body Mass Index (BMI) 40.0 and over, adult: Secondary | ICD-10-CM | POA: Diagnosis not present

## 2023-03-26 DIAGNOSIS — N1831 Chronic kidney disease, stage 3a: Secondary | ICD-10-CM | POA: Diagnosis present

## 2023-03-26 DIAGNOSIS — F03A18 Unspecified dementia, mild, with other behavioral disturbance: Secondary | ICD-10-CM | POA: Diagnosis present

## 2023-03-26 DIAGNOSIS — Z9981 Dependence on supplemental oxygen: Secondary | ICD-10-CM

## 2023-03-26 DIAGNOSIS — R609 Edema, unspecified: Secondary | ICD-10-CM | POA: Diagnosis not present

## 2023-03-26 DIAGNOSIS — Z9181 History of falling: Secondary | ICD-10-CM

## 2023-03-26 DIAGNOSIS — D631 Anemia in chronic kidney disease: Secondary | ICD-10-CM | POA: Diagnosis present

## 2023-03-26 DIAGNOSIS — Z7984 Long term (current) use of oral hypoglycemic drugs: Secondary | ICD-10-CM | POA: Diagnosis not present

## 2023-03-26 DIAGNOSIS — Z8249 Family history of ischemic heart disease and other diseases of the circulatory system: Secondary | ICD-10-CM

## 2023-03-26 DIAGNOSIS — Z79890 Hormone replacement therapy (postmenopausal): Secondary | ICD-10-CM

## 2023-03-26 DIAGNOSIS — I2510 Atherosclerotic heart disease of native coronary artery without angina pectoris: Secondary | ICD-10-CM | POA: Diagnosis not present

## 2023-03-26 DIAGNOSIS — I10 Essential (primary) hypertension: Secondary | ICD-10-CM | POA: Diagnosis present

## 2023-03-26 DIAGNOSIS — R300 Dysuria: Secondary | ICD-10-CM | POA: Diagnosis present

## 2023-03-26 DIAGNOSIS — E785 Hyperlipidemia, unspecified: Secondary | ICD-10-CM | POA: Diagnosis present

## 2023-03-26 DIAGNOSIS — Z993 Dependence on wheelchair: Secondary | ICD-10-CM

## 2023-03-26 DIAGNOSIS — I5041 Acute combined systolic (congestive) and diastolic (congestive) heart failure: Secondary | ICD-10-CM | POA: Diagnosis not present

## 2023-03-26 DIAGNOSIS — R4189 Other symptoms and signs involving cognitive functions and awareness: Secondary | ICD-10-CM | POA: Diagnosis present

## 2023-03-26 DIAGNOSIS — R7401 Elevation of levels of liver transaminase levels: Secondary | ICD-10-CM

## 2023-03-26 DIAGNOSIS — I2489 Other forms of acute ischemic heart disease: Secondary | ICD-10-CM | POA: Diagnosis present

## 2023-03-26 DIAGNOSIS — E1122 Type 2 diabetes mellitus with diabetic chronic kidney disease: Secondary | ICD-10-CM | POA: Diagnosis present

## 2023-03-26 DIAGNOSIS — E119 Type 2 diabetes mellitus without complications: Secondary | ICD-10-CM

## 2023-03-26 DIAGNOSIS — R9431 Abnormal electrocardiogram [ECG] [EKG]: Secondary | ICD-10-CM | POA: Diagnosis present

## 2023-03-26 DIAGNOSIS — D72829 Elevated white blood cell count, unspecified: Secondary | ICD-10-CM | POA: Diagnosis present

## 2023-03-26 DIAGNOSIS — G4700 Insomnia, unspecified: Secondary | ICD-10-CM | POA: Diagnosis present

## 2023-03-26 DIAGNOSIS — Z933 Colostomy status: Secondary | ICD-10-CM

## 2023-03-26 DIAGNOSIS — Z7401 Bed confinement status: Secondary | ICD-10-CM

## 2023-03-26 LAB — I-STAT ARTERIAL BLOOD GAS, ED
Acid-Base Excess: 8 mmol/L — ABNORMAL HIGH (ref 0.0–2.0)
Bicarbonate: 32.6 mmol/L — ABNORMAL HIGH (ref 20.0–28.0)
Calcium, Ion: 1.16 mmol/L (ref 1.15–1.40)
HCT: 31 % — ABNORMAL LOW (ref 36.0–46.0)
Hemoglobin: 10.5 g/dL — ABNORMAL LOW (ref 12.0–15.0)
O2 Saturation: 100 %
Patient temperature: 96.4
Potassium: 3.5 mmol/L (ref 3.5–5.1)
Sodium: 142 mmol/L (ref 135–145)
TCO2: 34 mmol/L — ABNORMAL HIGH (ref 22–32)
pCO2 arterial: 43.2 mm[Hg] (ref 32–48)
pH, Arterial: 7.481 — ABNORMAL HIGH (ref 7.35–7.45)
pO2, Arterial: 181 mm[Hg] — ABNORMAL HIGH (ref 83–108)

## 2023-03-26 LAB — ECHOCARDIOGRAM COMPLETE
AR max vel: 1.07 cm2
AV Area VTI: 1.05 cm2
AV Area mean vel: 0.99 cm2
AV Mean grad: 5 mm[Hg]
AV Peak grad: 8.8 mm[Hg]
Ao pk vel: 1.48 m/s
Area-P 1/2: 5.02 cm2
Est EF: 50
Height: 63 in
S' Lateral: 1.8 cm
Weight: 4000 [oz_av]

## 2023-03-26 LAB — RESPIRATORY PANEL BY PCR

## 2023-03-26 LAB — CBC
HCT: 36.2 % (ref 36.0–46.0)
HCT: 31.4 % — ABNORMAL LOW (ref 36.0–46.0)
Hemoglobin: 9.9 g/dL — ABNORMAL LOW (ref 12.0–15.0)
Hemoglobin: 9.1 g/dL — ABNORMAL LOW (ref 12.0–15.0)
MCH: 24.6 pg — ABNORMAL LOW (ref 26.0–34.0)
MCH: 25.3 pg — ABNORMAL LOW (ref 26.0–34.0)
MCHC: 27.3 g/dL — ABNORMAL LOW (ref 30.0–36.0)
MCHC: 29 g/dL — ABNORMAL LOW (ref 30.0–36.0)
MCV: 90 fL (ref 80.0–100.0)
MCV: 87.5 fL (ref 80.0–100.0)
Platelets: 262 10*3/uL (ref 150–400)
Platelets: 182 10*3/uL (ref 150–400)
RBC: 4.02 MIL/uL (ref 3.87–5.11)
RBC: 3.59 MIL/uL — ABNORMAL LOW (ref 3.87–5.11)
RDW: 13.7 % (ref 11.5–15.5)
RDW: 13.7 % (ref 11.5–15.5)
WBC: 13.5 10*3/uL — ABNORMAL HIGH (ref 4.0–10.5)
WBC: 15.1 10*3/uL — ABNORMAL HIGH (ref 4.0–10.5)
nRBC: 0.3 % — ABNORMAL HIGH (ref 0.0–0.2)
nRBC: 0 % (ref 0.0–0.2)

## 2023-03-26 LAB — I-STAT CG4 LACTIC ACID, ED
Lactic Acid, Venous: 6.8 mmol/L (ref 0.5–1.9)
Lactic Acid, Venous: 2.7 mmol/L (ref 0.5–1.9)

## 2023-03-26 LAB — I-STAT CHEM 8, ED
BUN: 22 mg/dL (ref 8–23)
Calcium, Ion: 1.13 mmol/L — ABNORMAL LOW (ref 1.15–1.40)
Chloride: 101 mmol/L (ref 98–111)
Creatinine, Ser: 1.1 mg/dL — ABNORMAL HIGH (ref 0.44–1.00)
Glucose, Bld: 188 mg/dL — ABNORMAL HIGH (ref 70–99)
HCT: 35 % — ABNORMAL LOW (ref 36.0–46.0)
Hemoglobin: 11.9 g/dL — ABNORMAL LOW (ref 12.0–15.0)
Potassium: 4.3 mmol/L (ref 3.5–5.1)
Sodium: 144 mmol/L (ref 135–145)
TCO2: 32 mmol/L (ref 22–32)

## 2023-03-26 LAB — COMPREHENSIVE METABOLIC PANEL
ALT: 729 U/L — ABNORMAL HIGH (ref 0–44)
AST: 704 U/L — ABNORMAL HIGH (ref 15–41)
Albumin: 3.3 g/dL — ABNORMAL LOW (ref 3.5–5.0)
Alkaline Phosphatase: 167 U/L — ABNORMAL HIGH (ref 38–126)
Anion gap: 15 (ref 5–15)
BUN: 17 mg/dL (ref 8–23)
CO2: 28 mmol/L (ref 22–32)
Calcium: 9.4 mg/dL (ref 8.9–10.3)
Chloride: 101 mmol/L (ref 98–111)
Creatinine, Ser: 1.08 mg/dL — ABNORMAL HIGH (ref 0.44–1.00)
GFR, Estimated: 52 mL/min — ABNORMAL LOW (ref >60)
Glucose, Bld: 188 mg/dL — ABNORMAL HIGH (ref 70–99)
Potassium: 4.2 mmol/L (ref 3.5–5.1)
Sodium: 144 mmol/L (ref 135–145)
Total Bilirubin: 0.7 mg/dL (ref <1.2)
Total Protein: 6.9 g/dL (ref 6.5–8.1)

## 2023-03-26 LAB — GLUCOSE, CAPILLARY
Glucose-Capillary: 225 mg/dL — ABNORMAL HIGH (ref 70–99)
Glucose-Capillary: 163 mg/dL — ABNORMAL HIGH (ref 70–99)
Glucose-Capillary: 124 mg/dL — ABNORMAL HIGH (ref 70–99)

## 2023-03-26 LAB — RAPID URINE DRUG SCREEN, HOSP PERFORMED
Amphetamines: NOT DETECTED
Barbiturates: NOT DETECTED
Benzodiazepines: NOT DETECTED
Cocaine: NOT DETECTED
Opiates: NOT DETECTED
Tetrahydrocannabinol: NOT DETECTED

## 2023-03-26 LAB — HEMOGLOBIN A1C
Hgb A1c MFr Bld: 6.1 % — ABNORMAL HIGH (ref 4.8–5.6)
Mean Plasma Glucose: 128.37 mg/dL

## 2023-03-26 LAB — URINALYSIS, W/ REFLEX TO CULTURE (INFECTION SUSPECTED)
Bilirubin Urine: NEGATIVE
Glucose, UA: NEGATIVE mg/dL
Ketones, ur: NEGATIVE mg/dL
Leukocytes,Ua: NEGATIVE
Nitrite: NEGATIVE
Protein, ur: 100 mg/dL — AB
Specific Gravity, Urine: 1.014 (ref 1.005–1.030)
pH: 5 (ref 5.0–8.0)

## 2023-03-26 LAB — BRAIN NATRIURETIC PEPTIDE: B Natriuretic Peptide: 202.9 pg/mL — ABNORMAL HIGH (ref 0.0–100.0)

## 2023-03-26 LAB — I-STAT VENOUS BLOOD GAS, ED
Acid-Base Excess: 8 mmol/L — ABNORMAL HIGH (ref 0.0–2.0)
Bicarbonate: 33.2 mmol/L — ABNORMAL HIGH (ref 20.0–28.0)
Calcium, Ion: 1.11 mmol/L — ABNORMAL LOW (ref 1.15–1.40)
HCT: 30 % — ABNORMAL LOW (ref 36.0–46.0)
Hemoglobin: 10.2 g/dL — ABNORMAL LOW (ref 12.0–15.0)
O2 Saturation: 77 %
Potassium: 3.7 mmol/L (ref 3.5–5.1)
Sodium: 144 mmol/L (ref 135–145)
TCO2: 35 mmol/L — ABNORMAL HIGH (ref 22–32)
pCO2, Ven: 47.2 mm[Hg] (ref 44–60)
pH, Ven: 7.455 — ABNORMAL HIGH (ref 7.25–7.43)
pO2, Ven: 40 mm[Hg] (ref 32–45)

## 2023-03-26 LAB — CREATININE, SERUM
Creatinine, Ser: 1.05 mg/dL — ABNORMAL HIGH (ref 0.44–1.00)
GFR, Estimated: 54 mL/min — ABNORMAL LOW (ref >60)

## 2023-03-26 LAB — CBG MONITORING, ED: Glucose-Capillary: 181 mg/dL — ABNORMAL HIGH (ref 70–99)

## 2023-03-26 LAB — TROPONIN I (HIGH SENSITIVITY)
Troponin I (High Sensitivity): 33 ng/L — ABNORMAL HIGH (ref <18)
Troponin I (High Sensitivity): 113 ng/L (ref <18)
Troponin I (High Sensitivity): 281 ng/L (ref <18)

## 2023-03-26 LAB — MAGNESIUM: Magnesium: 1.1 mg/dL — ABNORMAL LOW (ref 1.7–2.4)

## 2023-03-26 LAB — PHOSPHORUS: Phosphorus: 3.2 mg/dL (ref 2.5–4.6)

## 2023-03-26 LAB — MRSA NEXT GEN BY PCR, NASAL: MRSA by PCR Next Gen: DETECTED — AB

## 2023-03-26 LAB — PROCALCITONIN: Procalcitonin: 0.49 ng/mL

## 2023-03-26 MED ORDER — BUDESONIDE 0.25 MG/2ML IN SUSP
0.2500 mg | Freq: Two times a day (BID) | RESPIRATORY_TRACT | Status: DC
  Administered 2023-03-26 – 2023-04-03 (×16): 0.25 mg via RESPIRATORY_TRACT
  Filled 2023-03-26 (×17): qty 2

## 2023-03-26 MED ORDER — REVEFENACIN 175 MCG/3ML IN SOLN
175.0000 ug | Freq: Every day | RESPIRATORY_TRACT | Status: DC
  Administered 2023-03-26 – 2023-04-03 (×9): 175 ug via RESPIRATORY_TRACT
  Filled 2023-03-26 (×8): qty 3

## 2023-03-26 MED ORDER — NOREPINEPHRINE 4 MG/250ML-% IV SOLN
2.0000 ug/min | INTRAVENOUS | Status: DC
  Administered 2023-03-26: 10 ug/min via INTRAVENOUS

## 2023-03-26 MED ORDER — ACETAMINOPHEN 650 MG RE SUPP
650.0000 mg | RECTAL | Status: AC
Start: 2023-03-26 — End: 2023-03-28

## 2023-03-26 MED ORDER — MAGNESIUM SULFATE 50 % IJ SOLN: 6.0000 g | Freq: Once | INTRAVENOUS | Status: DC

## 2023-03-26 MED ORDER — INSULIN ASPART 100 UNIT/ML IJ SOLN
0.0000 [IU] | INTRAMUSCULAR | Status: DC
  Administered 2023-03-26: 3 [IU] via SUBCUTANEOUS
  Administered 2023-03-26: 5 [IU] via SUBCUTANEOUS
  Administered 2023-03-27: 2 [IU] via SUBCUTANEOUS

## 2023-03-26 MED ORDER — METRONIDAZOLE 500 MG/100ML IV SOLN
500.0000 mg | Freq: Two times a day (BID) | INTRAVENOUS | Status: DC
  Administered 2023-03-26: 500 mg via INTRAVENOUS
  Filled 2023-03-26: qty 100

## 2023-03-26 MED ORDER — ETOMIDATE 2 MG/ML IV SOLN
INTRAVENOUS | Status: AC | PRN
  Administered 2023-03-26: 10 mg via INTRAVENOUS

## 2023-03-26 MED ORDER — NOREPINEPHRINE 4 MG/250ML-% IV SOLN: 0.0000 ug/min | INTRAVENOUS | Status: DC

## 2023-03-26 MED ORDER — ARFORMOTEROL TARTRATE 15 MCG/2ML IN NEBU
15.0000 ug | INHALATION_SOLUTION | Freq: Two times a day (BID) | RESPIRATORY_TRACT | Status: DC
  Administered 2023-03-26 – 2023-04-02 (×16): 15 ug via RESPIRATORY_TRACT
  Filled 2023-03-26 (×16): qty 2

## 2023-03-26 MED ORDER — PROPOFOL 1000 MG/100ML IV EMUL
5.0000 ug/kg/min | INTRAVENOUS | Status: DC
  Administered 2023-03-26: 10 ug/kg/min via INTRAVENOUS

## 2023-03-26 MED ORDER — ACETAMINOPHEN 325 MG PO TABS
650.0000 mg | ORAL_TABLET | ORAL | Status: AC
Start: 2023-03-26 — End: 2023-03-28
  Administered 2023-03-27: 650 mg via ORAL
  Filled 2023-03-26: qty 2

## 2023-03-26 MED ORDER — DOCUSATE SODIUM 50 MG/5ML PO LIQD: 100.0000 mg | Freq: Two times a day (BID) | ORAL | Status: DC | PRN

## 2023-03-26 MED ORDER — HEPARIN SODIUM (PORCINE) 5000 UNIT/ML IJ SOLN
5000.0000 [IU] | Freq: Three times a day (TID) | INTRAMUSCULAR | Status: DC
Start: 2023-03-26 — End: 2023-03-27
  Administered 2023-03-26 – 2023-03-27 (×3): 5000 [IU] via SUBCUTANEOUS
  Filled 2023-03-26 (×3): qty 1

## 2023-03-26 MED ORDER — SODIUM CHLORIDE 0.9 % IV BOLUS
600.0000 mL | Freq: Once | INTRAVENOUS | Status: AC
  Administered 2023-03-26: 600 mL via INTRAVENOUS

## 2023-03-26 MED ORDER — ATORVASTATIN CALCIUM 40 MG PO TABS: 80.0000 mg | ORAL_TABLET | Freq: Every day | ORAL | Status: DC

## 2023-03-26 MED ORDER — PANTOPRAZOLE SODIUM 40 MG IV SOLR
40.0000 mg | Freq: Every day | INTRAVENOUS | Status: DC
  Administered 2023-03-26 – 2023-03-27 (×2): 40 mg via INTRAVENOUS
  Filled 2023-03-26 (×2): qty 10

## 2023-03-26 MED ORDER — ORAL CARE MOUTH RINSE
15.0000 mL | OROMUCOSAL | Status: DC
  Administered 2023-03-26 – 2023-04-03 (×85): 15 mL via OROMUCOSAL

## 2023-03-26 MED ORDER — VANCOMYCIN HCL IN DEXTROSE 1-5 GM/200ML-% IV SOLN: 1000.0000 mg | INTRAVENOUS | Status: DC

## 2023-03-26 MED ORDER — MAGNESIUM SULFATE 2 GM/50ML IV SOLN
2.0000 g | Freq: Once | INTRAVENOUS | Status: AC
Start: 2023-03-26 — End: 2023-03-26
  Administered 2023-03-26: 2 g via INTRAVENOUS
  Filled 2023-03-26: qty 50

## 2023-03-26 MED ORDER — CHLORHEXIDINE GLUCONATE CLOTH 2 % EX PADS
6.0000 | MEDICATED_PAD | Freq: Every day | CUTANEOUS | Status: DC
  Administered 2023-03-26 – 2023-04-03 (×10): 6 via TOPICAL

## 2023-03-26 MED ORDER — IPRATROPIUM-ALBUTEROL 0.5-2.5 (3) MG/3ML IN SOLN
3.0000 mL | RESPIRATORY_TRACT | Status: DC | PRN
  Administered 2023-03-29 (×2): 3 mL via RESPIRATORY_TRACT
  Filled 2023-03-26 (×2): qty 3

## 2023-03-26 MED ORDER — PROPOFOL 1000 MG/100ML IV EMUL: 0.0000 ug/kg/min | INTRAVENOUS | Status: DC

## 2023-03-26 MED ORDER — FENTANYL CITRATE PF 50 MCG/ML IJ SOSY: 25.0000 ug | PREFILLED_SYRINGE | INTRAMUSCULAR | Status: DC | PRN

## 2023-03-26 MED ORDER — VANCOMYCIN HCL IN DEXTROSE 1-5 GM/200ML-% IV SOLN
1000.0000 mg | Freq: Once | INTRAVENOUS | Status: DC
Start: 2023-03-26 — End: 2023-03-26

## 2023-03-26 MED ORDER — ORAL CARE MOUTH RINSE: 15.0000 mL | OROMUCOSAL | Status: DC | PRN

## 2023-03-26 MED ORDER — MUPIROCIN 2 % EX OINT
1.0000 | TOPICAL_OINTMENT | Freq: Two times a day (BID) | CUTANEOUS | Status: AC
  Administered 2023-03-26 – 2023-03-31 (×10): 1 via NASAL
  Filled 2023-03-26 (×5): qty 22

## 2023-03-26 MED ORDER — MAGNESIUM SULFATE 2 GM/50ML IV SOLN
2.0000 g | Freq: Once | INTRAVENOUS | Status: DC | PRN
  Administered 2023-03-26: 2 g via INTRAVENOUS
  Filled 2023-03-26: qty 50

## 2023-03-26 MED ORDER — ACETAMINOPHEN 160 MG/5ML PO SOLN: 650.0000 mg | ORAL | Status: DC | PRN

## 2023-03-26 MED ORDER — METRONIDAZOLE 500 MG/100ML IV SOLN
500.0000 mg | Freq: Once | INTRAVENOUS | Status: AC
  Administered 2023-03-26: 500 mg via INTRAVENOUS
  Filled 2023-03-26: qty 100

## 2023-03-26 MED ORDER — LACTATED RINGERS IV BOLUS (SEPSIS)
600.0000 mL | Freq: Once | INTRAVENOUS | Status: DC
Start: 2023-03-26 — End: 2023-03-26

## 2023-03-26 MED ORDER — PHENYLEPHRINE 80 MCG/ML (10ML) SYRINGE FOR IV PUSH (FOR BLOOD PRESSURE SUPPORT)
PREFILLED_SYRINGE | INTRAVENOUS | Status: AC | PRN
  Administered 2023-03-26: 160 ug via INTRAVENOUS

## 2023-03-26 MED ORDER — BUSPIRONE HCL 15 MG PO TABS: 30.0000 mg | ORAL_TABLET | Freq: Three times a day (TID) | ORAL | Status: DC | PRN

## 2023-03-26 MED ORDER — SODIUM CHLORIDE 0.9 % IV SOLN
2.0000 g | Freq: Once | INTRAVENOUS | Status: AC
  Administered 2023-03-26: 2 g via INTRAVENOUS
  Filled 2023-03-26: qty 12.5

## 2023-03-26 MED ORDER — MIDAZOLAM HCL 2 MG/2ML IJ SOLN
1.0000 mg | Freq: Once | INTRAMUSCULAR | Status: AC
Start: 2023-03-26 — End: 2023-03-26
  Administered 2023-03-26: 1 mg via INTRAVENOUS

## 2023-03-26 MED ORDER — SODIUM CHLORIDE 0.9 % IV SOLN
2.0000 g | Freq: Two times a day (BID) | INTRAVENOUS | Status: DC
  Administered 2023-03-26: 2 g via INTRAVENOUS
  Filled 2023-03-26: qty 12.5

## 2023-03-26 MED ORDER — SODIUM CHLORIDE 0.9 % IV BOLUS
1000.0000 mL | Freq: Once | INTRAVENOUS | Status: AC
  Administered 2023-03-26: 1000 mL via INTRAVENOUS

## 2023-03-26 MED ORDER — SODIUM CHLORIDE 0.9 % IV SOLN: INTRAVENOUS | Status: DC

## 2023-03-26 MED ORDER — SODIUM CHLORIDE 0.9 % IV SOLN: 250.0000 mL | INTRAVENOUS | Status: DC

## 2023-03-26 MED ORDER — ROCURONIUM BROMIDE 10 MG/ML (PF) SYRINGE
PREFILLED_SYRINGE | INTRAVENOUS | Status: AC | PRN
  Administered 2023-03-26: 100 mg via INTRAVENOUS

## 2023-03-26 MED ORDER — NOREPINEPHRINE 4 MG/250ML-% IV SOLN: 2.0000 ug/min | INTRAVENOUS | Status: DC

## 2023-03-26 MED ORDER — MIDAZOLAM HCL 2 MG/2ML IJ SOLN
INTRAMUSCULAR | Status: AC
  Filled 2023-03-26: qty 2

## 2023-03-26 MED ORDER — NOREPINEPHRINE BITARTRATE 1 MG/ML IV SOLN
INTRAVENOUS | Status: AC | PRN
  Administered 2023-03-26: 10 ug/mL via INTRAVENOUS

## 2023-03-26 MED ORDER — ONDANSETRON HCL 4 MG/2ML IJ SOLN: 4.0000 mg | Freq: Four times a day (QID) | INTRAMUSCULAR | Status: DC | PRN

## 2023-03-26 MED ORDER — FENTANYL CITRATE PF 50 MCG/ML IJ SOSY
25.0000 ug | PREFILLED_SYRINGE | INTRAMUSCULAR | Status: DC | PRN
  Administered 2023-03-26 – 2023-03-27 (×3): 50 ug via INTRAVENOUS
  Filled 2023-03-26 (×3): qty 1

## 2023-03-26 MED ORDER — MIDAZOLAM HCL 2 MG/2ML IJ SOLN
1.0000 mg | Freq: Once | INTRAMUSCULAR | Status: AC
  Administered 2023-03-26: 1 mg via INTRAVENOUS
  Filled 2023-03-26: qty 2

## 2023-03-26 MED ORDER — ACETAMINOPHEN 650 MG RE SUPP: 650.0000 mg | RECTAL | Status: DC | PRN

## 2023-03-26 MED ORDER — FAMOTIDINE 20 MG PO TABS
20.0000 mg | ORAL_TABLET | Freq: Two times a day (BID) | ORAL | Status: DC
  Administered 2023-03-26 – 2023-03-27 (×3): 20 mg
  Filled 2023-03-26 (×3): qty 1

## 2023-03-26 MED ORDER — POLYETHYLENE GLYCOL 3350 17 G PO PACK: 17.0000 g | PACK | Freq: Every day | ORAL | Status: DC | PRN

## 2023-03-26 MED ORDER — ACETAMINOPHEN 325 MG PO TABS: 650.0000 mg | ORAL_TABLET | ORAL | Status: DC | PRN

## 2023-03-26 MED ORDER — ACETAMINOPHEN 160 MG/5ML PO SOLN
650.0000 mg | ORAL | Status: AC
Start: 2023-03-26 — End: 2023-03-28
  Administered 2023-03-26 – 2023-03-28 (×7): 650 mg
  Filled 2023-03-26 (×7): qty 20.3

## 2023-03-26 MED ORDER — AZTREONAM 2 G IJ SOLR: 2.0000 g | Freq: Once | INTRAMUSCULAR | Status: DC

## 2023-03-26 MED ORDER — SODIUM CHLORIDE 0.9 % IV SOLN: INTRAVENOUS | Status: DC | PRN

## 2023-03-26 MED ORDER — VANCOMYCIN HCL 10 G IV SOLR
2250.0000 mg | Freq: Once | INTRAVENOUS | Status: AC
  Administered 2023-03-26: 2250 mg via INTRAVENOUS
  Filled 2023-03-26: qty 22.5

## 2023-03-26 MED ORDER — MAGNESIUM SULFATE 4 GM/100ML IV SOLN
4.0000 g | Freq: Once | INTRAVENOUS | Status: AC
  Administered 2023-03-26: 4 g via INTRAVENOUS
  Filled 2023-03-26: qty 100

## 2023-03-26 NOTE — Progress Notes (Signed)
Called to place EEG. Patient is still getting settled in room. Lines being placed. Nurse requested an hour before coming up. Will attempt as soon as schedule permits.

## 2023-03-26 NOTE — ED Provider Notes (Addendum)
[facility] 48M MEDICAL ICU Provider Note   CSN: [id number] Arrival date & time: 03/26/23  1122     History  No chief complaint on file.   Courtney [name] is a 77 y.o. female.  77 year old female with past medical history of HTN, HLD, COPD, HFpEF, dementia, T2DM presents here following reported cardiac arrest.  The patient was at her nursing facility, when she had a fall.  The patient was assisted by staff.  Shortly after that, staff realized that the patient had suddenly stopped talking.  When they evaluated the patient, she was unresponsive and pulseless.  CPR was performed for approximately 5 minutes.  Patient had an unknown rhythm.  She was not given any medications.  ROSC was achieved in the field.  Patient was GCS of 3 with medical crew during transportation.  She required respiratory assistance with BVM ventilations.  On arrival here, she is unresponsive.  GCS 3.  The history is provided by the EMS personnel.       Home Medications Prior to Admission medications   Medication Sig Start Date End Date Taking? Authorizing Provider  acetaminophen (TYLENOL) 325 MG tablet Take 650 mg by mouth 3 (three) times daily as needed for moderate pain (pain score 4-6).   Yes [provider]  amLODipine (NORVASC) 10 MG tablet Take 1 tablet (10 mg total) by mouth daily. 07/09/18  Yes Dhungel, Nishant, MD  aspirin EC 81 MG EC tablet Take 1 tablet (81 mg total) by mouth daily. 07/09/18  Yes Dhungel, Nishant, MD  atorvastatin (LIPITOR) 80 MG tablet Take 80 mg by mouth at bedtime.   Yes [provider]  Cholecalciferol 25 MCG (1000 UT) tablet Take 1,000 Units by mouth in the morning.   Yes [provider]  ferrous sulfate 325 (65 FE) MG tablet Take 1 tablet (325 mg total) by mouth 2 (two) times daily with a meal. Patient taking differently: Take 325 mg by mouth daily. 10/21/19  Yes Rhetta Mura, MD  fluticasone (FLONASE) 50 MCG/ACT nasal spray Place 1  spray into both nostrils daily. 09/19/19  Yes [provider]  furosemide (LASIX) 40 MG tablet Take 1 tablet (40 mg total) by mouth 2 (two) times daily. Patient taking differently: Take 40 mg by mouth daily. 07/08/21 03/26/23 Yes Jerald Kief, MD  glucagon (GLUCAGEN HYPOKIT) 1 MG SOLR injection Inject 1 mg into the muscle daily as needed for low blood sugar.   Yes [provider]  Glycopyrrolate-Formoterol (BEVESPI AEROSPHERE) 9-4.8 MCG/ACT AERO Inhale 2 puffs into the lungs 2 (two) times daily.   Yes [provider]  hydrOXYzine (ATARAX) 25 MG tablet Take 25 mg by mouth daily as needed for itching. For itching   Yes [provider]  ipratropium-albuterol (DUONEB) 0.5-2.5 (3) MG/3ML SOLN Take 3 mLs by nebulization every 6 (six) hours as needed. Patient taking differently: Take 3 mLs by nebulization 3 (three) times daily. 07/08/18  Yes Dhungel, Nishant, MD  Melatonin 10 MG TABS Take 10 mg by mouth at bedtime.   Yes [provider]  metFORMIN (GLUCOPHAGE) 500 MG tablet Take 500 mg by mouth 2 (two) times daily with a meal.   Yes [provider]  metoprolol succinate (TOPROL-XL) 25 MG 24 hr tablet Take 25 mg by mouth daily.   Yes [provider]  nitroGLYCERIN (NITROSTAT) 0.4 MG SL tablet Place 0.4 mg under the tongue every 5 (five) minutes as needed for chest pain. 03/06/21  Yes [provider]  polyethylene glycol (MIRALAX / GLYCOLAX) 17 g packet Take 17 g by mouth daily.   Yes [provider]  saccharomyces boulardii (FLORASTOR) 250 MG capsule Take 250 mg by mouth 2 (two) times daily.   Yes [provider]  senna (SENOKOT) 8.6 MG TABS tablet Take 2 tablets by mouth 2 (two) times daily.   Yes [provider]  sertraline (ZOLOFT) 50 MG tablet Take 50 mg by mouth daily.    Yes [provider]  insulin aspart (NOVOLOG FLEXPEN) 100 UNIT/ML FlexPen Inject 0-17 Units into the skin See admin instructions.  Inject 0-16 units subcutaneously before meals and at bedtime per sliding scale: CBG 0-150 0 units, 151-200 4 units, 201-250 6 units, 251-300 8 units, 301-350 10 units, 351-400 14 units, 401-450 16 units, >450 contact MD Patient not taking: Reported on 03/26/2023    [provider]  insulin glargine (LANTUS) 100 unit/mL SOPN Inject 21 Units into the skin 2 (two) times daily. Patient not taking: Reported on 03/26/2023    [provider]  OXYGEN Inhale 2 L into the lungs continuous.    [provider]      Allergies    Egg-derived products and Penicillins    Review of Systems   Review of Systems  Unable to perform ROS: Acuity of condition    Physical Exam Updated Vital Signs BP 135/83   Pulse (!) 102   Temp (!) 96.1 F (35.6 C)   Resp (!) 22   Ht 5\' 3"  (1.6 m)   Wt 113.4 kg   SpO2 100%   BMI 44.29 kg/m  Physical Exam Vitals reviewed.  Constitutional:      General: She is in acute distress.     Appearance: She is obese. She is not ill-appearing, toxic-appearing or diaphoretic.  HENT:     Head: Normocephalic and atraumatic.  Cardiovascular:     Rate and Rhythm: Normal rate and regular rhythm.     Heart sounds: Normal heart sounds. No murmur heard.    No friction rub. No gallop.  Pulmonary:     Effort: Bradypnea and respiratory distress present.  Abdominal:     Palpations: Abdomen is soft.  Skin:    General: Skin is dry.  Neurological:     GCS: GCS eye subscore is 1. GCS verbal subscore is 1. GCS motor subscore is 1.     ED Results / Procedures / Treatments   Labs (all labs ordered are listed, but only abnormal results are displayed) Labs Reviewed  CBC - Abnormal; Notable for the following components:      Result Value   WBC 13.5 (*)    Hemoglobin 9.9 (*)    MCH 24.6 (*)    MCHC 27.3 (*)    nRBC 0.3 (*)    All other components within normal limits  COMPREHENSIVE METABOLIC PANEL - Abnormal; Notable for the following components:    Glucose, Bld 188 (*)    Creatinine, Ser 1.08 (*)    Albumin 3.3 (*)    AST 704 (*)    ALT 729 (*)    Alkaline Phosphatase 167 (*)    GFR, Estimated 52 (*)    All other components within normal limits  URINALYSIS, W/ REFLEX TO CULTURE (INFECTION SUSPECTED) - Abnormal; Notable for the following components:   APPearance HAZY (*)    Hgb urine dipstick SMALL (*)    Protein, ur 100 (*)    Bacteria, UA MANY (*)    All other components within  normal limits  CBC - Abnormal; Notable for the following components:   WBC 15.1 (*)    RBC 3.59 (*)    Hemoglobin 9.1 (*)    HCT 31.4 (*)    MCH 25.3 (*)    MCHC 29.0 (*)    All other components within normal limits  CREATININE, SERUM - Abnormal; Notable for the following components:   Creatinine, Ser 1.05 (*)    GFR, Estimated 54 (*)    All other components within normal limits  HEMOGLOBIN A1C - Abnormal; Notable for the following components:   Hgb A1c MFr Bld 6.1 (*)    All other components within normal limits  BRAIN NATRIURETIC PEPTIDE - Abnormal; Notable for the following components:   B Natriuretic Peptide 202.9 (*)    All other components within normal limits  MAGNESIUM - Abnormal; Notable for the following components:   Magnesium 1.1 (*)    All other components within normal limits  GLUCOSE, CAPILLARY - Abnormal; Notable for the following components:   Glucose-Capillary 225 (*)    All other components within normal limits  I-STAT CG4 LACTIC ACID, ED - Abnormal; Notable for the following components:   Lactic Acid, Venous 6.8 (*)    All other components within normal limits  I-STAT CHEM 8, ED - Abnormal; Notable for the following components:   Creatinine, Ser 1.10 (*)    Glucose, Bld 188 (*)    Calcium, Ion 1.13 (*)    Hemoglobin 11.9 (*)    HCT 35.0 (*)    All other components within normal limits  CBG MONITORING, ED - Abnormal; Notable for the following components:   Glucose-Capillary 181 (*)    All other components within  normal limits  I-STAT VENOUS BLOOD GAS, ED - Abnormal; Notable for the following components:   pH, Ven 7.455 (*)    Bicarbonate 33.2 (*)    TCO2 35 (*)    Acid-Base Excess 8.0 (*)    Calcium, Ion 1.11 (*)    HCT 30.0 (*)    Hemoglobin 10.2 (*)    All other components within normal limits  I-STAT CG4 LACTIC ACID, ED - Abnormal; Notable for the following components:   Lactic Acid, Venous 2.7 (*)    All other components within normal limits  I-STAT ARTERIAL BLOOD GAS, ED - Abnormal; Notable for the following components:   pH, Arterial 7.481 (*)    pO2, Arterial 181 (*)    Bicarbonate 32.6 (*)    TCO2 34 (*)    Acid-Base Excess 8.0 (*)    HCT 31.0 (*)    Hemoglobin 10.5 (*)    All other components within normal limits  TROPONIN I (HIGH SENSITIVITY) - Abnormal; Notable for the following components:   Troponin I (High Sensitivity) 33 (*)    All other components within normal limits  TROPONIN I (HIGH SENSITIVITY) - Abnormal; Notable for the following components:   Troponin I (High Sensitivity) 113 (*)    All other components within normal limits  CULTURE, BLOOD (SINGLE)  RESPIRATORY PANEL BY PCR  MRSA NEXT GEN BY PCR, NASAL  CULTURE, RESPIRATORY W GRAM STAIN  PHOSPHORUS  BLOOD GAS, ARTERIAL  STREP PNEUMONIAE URINARY ANTIGEN  LEGIONELLA PNEUMOPHILA SEROGP 1 UR AG  PROCALCITONIN  RAPID URINE DRUG SCREEN, HOSP PERFORMED    EKG None  Radiology CT CHEST ABDOMEN PELVIS WO CONTRAST  Result Date: 03/26/2023 CLINICAL DATA:  Altered mental status following resuscitation with sepsis EXAM: CT CHEST, ABDOMEN AND PELVIS WITHOUT CONTRAST TECHNIQUE:  Multidetector CT imaging of the chest, abdomen and pelvis was performed following the standard protocol without IV contrast. RADIATION DOSE REDUCTION: This exam was performed according to the departmental dose-optimization program which includes automated exposure control, adjustment of the mA and/or kV according to patient size and/or use of  iterative reconstruction technique. COMPARISON:  Same day chest radiograph, CT abdomen and pelvis dated 06/25/2018 FINDINGS: CT CHEST FINDINGS Cardiovascular: Normal heart size. No significant pericardial fluid/thickening. Great vessels are normal in course and caliber. Coronary artery calcifications. Mediastinum/Nodes: Diffusely heterogeneous thyroid gland. Normal esophagus. No pathologically enlarged axillary, supraclavicular, mediastinal, or hilar lymph nodes. Lungs/Pleura: ETT terminates in the right mainstem bronchus. Layering secretions within the trachea. The central airways are patent. Diffuse peribronchial wall thickening throughout. Asymmetric right-greater-than-left interlobular septal thickening. A few scattered tree-in-bud nodules in the bilateral upper and middle lobes. Near-complete atelectasis of bilateral lower lobes. No pneumothorax. Small right and moderate left pleural effusions. Musculoskeletal: No acute or abnormal lytic or blastic osseous lesions. Multilevel degenerative changes of the thoracic spine. CT ABDOMEN PELVIS FINDINGS Hepatobiliary: No focal hepatic lesions. No intra or extrahepatic biliary ductal dilation. Cholecystectomy. Pancreas: No focal lesions or main ductal dilation. Spleen: Normal in size without focal abnormality. Adrenals/Urinary Tract: No adrenal nodules. No suspicious renal mass on this noncontrast enhanced examination , calculi, or hydronephrosis. Bladder is decompressed with catheter in-situ. Stomach/Bowel: Enteric tube terminates in the gastric antrum. Left lower quadrant colostomy. No evidence of bowel wall thickening, distention, or inflammatory changes. Appendix is not discretely seen. Vascular/Lymphatic: Aortic atherosclerosis. No enlarged abdominal or pelvic lymph nodes. Reproductive: No adnexal masses. Other: Trace free fluid. No fluid collection or free air. Musculoskeletal: No acute or abnormal lytic or blastic osseous findings. Multilevel degenerative changes  of the lumbar spine. Postsurgical changes of the anterior abdominal wall. Superior endplate compression of L2. Irregular soft tissue thickening in the right inguinal region and anterior peritoneum, likely postsurgical. IMPRESSION: 1. ETT terminates in the proximal right main bronchus. Recommend retraction. 2. Asymmetric right-greater-than-left interlobular septal thickening, likely pulmonary edema. 3. Small right and moderate left pleural effusions with near-complete atelectasis of bilateral lower lobes. 4. A few scattered tree-in-bud nodules in the bilateral upper and middle lobes, likely infectious/inflammatory. 5. No acute abdominopelvic findings. 6. Superior endplate compression of L2, age indeterminate. 7. Heterogeneous thyroid gland. In the setting of significant comorbidities or limited life expectancy, no follow-up would be recommended. Otherwise, nonemergent ultrasound examination of the thyroid. (Ref: J Am Coll Radiol. 2015 Feb;12(2): 143-50). 8. Aortic Atherosclerosis (ICD10-I70.0). Coronary artery calcifications. Assessment for potential risk factor modification, dietary therapy or pharmacologic therapy may be warranted, if clinically indicated. Critical Value/emergent results were called by telephone at the time of interpretation on 03/26/2023 at 3:14 pm to provider Dr. Welton Flakes, who verbally acknowledged these results. Electronically Signed   By: Agustin Cree M.D.   On: 03/26/2023 15:17   ECHOCARDIOGRAM COMPLETE  Result Date: 03/26/2023    ECHOCARDIOGRAM REPORT   Patient Name:   Courtney [name] Date of Exam: 03/26/2023 Medical Rec #:  [id number]       Height:       63.0 in Accession #:    [id number]      Weight:       250.0 lb Date of Birth:  13-Jul-1945        BSA:          2.126 m Patient Age:    77 years        BP:  162/70 mmHg Patient Gender: F               HR:           96 bpm. Exam Location:  Inpatient Procedure: 2D Echo, Cardiac Doppler, Color Doppler and Strain Analysis Indications:     cardiac arrest  History:        Patient has prior history of Echocardiogram examinations, most                 recent 07/05/2021. Risk Factors:Hypertension, Sleep Apnea,                 Diabetes and Former Smoker.  Sonographer:    Karma Ganja Referring Phys: Cristopher Peru  Sonographer Comments: Global longitudinal strain was attempted. IMPRESSIONS  1. Left ventricular ejection fraction, by estimation, is 50%. The left ventricle has mildly decreased function. The left ventricle demonstrates regional wall motion abnormalities with severe hypokinesis of the apical septal wall and true apex. There is mild concentric left ventricular hypertrophy. Left ventricular diastolic parameters are consistent with Grade I diastolic dysfunction (impaired relaxation).  2. Right ventricular systolic function is mildly reduced. The right ventricular size is normal. Tricuspid regurgitation signal is inadequate for assessing PA pressure.  3. Left atrial size was mildly dilated.  4. Right atrial size was mildly dilated.  5. The mitral valve is normal in structure. No evidence of mitral valve regurgitation. No evidence of mitral stenosis.  6. The aortic valve is tricuspid. There is mild calcification of the aortic valve. Aortic valve regurgitation is not visualized. No aortic stenosis is present.  7. The inferior vena cava is normal in size with greater than 50% respiratory variability, suggesting right atrial pressure of 3 mmHg. FINDINGS  Left Ventricle: Left ventricular ejection fraction, by estimation, is 50%. The left ventricle has mildly decreased function. The left ventricle demonstrates regional wall motion abnormalities. The left ventricular internal cavity size was normal in size. There is mild concentric left ventricular hypertrophy. Left ventricular diastolic parameters are consistent with Grade I diastolic dysfunction (impaired relaxation). Right Ventricle: The right ventricular size is normal. No increase in right ventricular  wall thickness. Right ventricular systolic function is mildly reduced. Tricuspid regurgitation signal is inadequate for assessing PA pressure. Left Atrium: Left atrial size was mildly dilated. Right Atrium: Right atrial size was mildly dilated. Pericardium: There is no evidence of pericardial effusion. Mitral Valve: The mitral valve is normal in structure. There is mild calcification of the mitral valve leaflet(s). Mild mitral annular calcification. No evidence of mitral valve regurgitation. No evidence of mitral valve stenosis. Tricuspid Valve: The tricuspid valve is normal in structure. Tricuspid valve regurgitation is not demonstrated. Aortic Valve: The aortic valve is tricuspid. There is mild calcification of the aortic valve. Aortic valve regurgitation is not visualized. No aortic stenosis is present. Aortic valve mean gradient measures 5.0 mmHg. Aortic valve peak gradient measures 8.8 mmHg. Aortic valve area, by VTI measures 1.05 cm. Pulmonic Valve: The pulmonic valve was normal in structure. Pulmonic valve regurgitation is not visualized. Aorta: The aortic root is normal in size and structure. Venous: The inferior vena cava is normal in size with greater than 50% respiratory variability, suggesting right atrial pressure of 3 mmHg. IAS/Shunts: No atrial level shunt detected by color flow Doppler.  LEFT VENTRICLE PLAX 2D LVIDd:         3.60 cm   Diastology LVIDs:         1.80 cm   LV e' medial:  9.36 cm/s LV PW:         1.20 cm   LV E/e' medial:  11.9 LV IVS:        1.30 cm   LV e' lateral:   12.50 cm/s LVOT diam:     1.90 cm   LV E/e' lateral: 8.9 LV SV:         26 LV SV Index:   12 LVOT Area:     2.84 cm  RIGHT VENTRICLE            IVC RV S prime:     6.96 cm/s  IVC diam: 1.70 cm TAPSE (M-mode): 1.8 cm LEFT ATRIUM           Index        RIGHT ATRIUM           Index LA diam:      2.50 cm 1.18 cm/m   RA Area:     18.30 cm LA Vol (A2C): 23.9 ml 11.24 ml/m  RA Volume:   49.90 ml  23.47 ml/m LA Vol (A4C):  21.9 ml 10.30 ml/m  AORTIC VALVE AV Area (Vmax):    1.07 cm AV Area (Vmean):   0.99 cm AV Area (VTI):     1.05 cm AV Vmax:           148.00 cm/s AV Vmean:          103.000 cm/s AV VTI:            0.244 m AV Peak Grad:      8.8 mmHg AV Mean Grad:      5.0 mmHg LVOT Vmax:         56.10 cm/s LVOT Vmean:        36.000 cm/s LVOT VTI:          0.090 m LVOT/AV VTI ratio: 0.37  AORTA Ao Root diam: 2.40 cm MITRAL VALVE MV Area (PHT): 5.02 cm     SHUNTS MV Decel Time: 151 msec     Systemic VTI:  0.09 m MV E velocity: 111.00 cm/s  Systemic Diam: 1.90 cm MV A velocity: 107.00 cm/s MV E/A ratio:  1.04 Dalton McleanMD Electronically signed by Wilfred Lacy Signature Date/Time: 03/26/2023/2:40:18 PM    Final    CT Head Wo Contrast  Result Date: 03/26/2023 CLINICAL DATA:  Provided history: Facial trauma, blunt. Ataxia, head trauma. Additional history provided: Fall. Patient subsequently became apneic/pulseless. EXAM: CT HEAD WITHOUT CONTRAST CT CERVICAL SPINE WITHOUT CONTRAST TECHNIQUE: Multidetector CT imaging of the head and cervical spine was performed following the standard protocol without intravenous contrast. Multiplanar CT image reconstructions of the cervical spine were also generated. RADIATION DOSE REDUCTION: This exam was performed according to the departmental dose-optimization program which includes automated exposure control, adjustment of the mA and/or kV according to patient size and/or use of iterative reconstruction technique. COMPARISON:  Head CT 07/04/2021.  Cervical spine CT 07/04/2021. FINDINGS: CT HEAD FINDINGS Brain: Generalized parenchymal atrophy. 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 Background mild patchy and ill-defined hypoattenuation within the cerebral white matter, nonspecific but compatible with chronic small ischemic disease. There is no acute intracranial hemorrhage. No acute demarcated cortical infarct. No extra-axial fluid  collection. No evidence of an intracranial mass. No midline shift. Vascular: No hyperdense vessel. Atherosclerotic calcifications. Skull: No calvarial fracture or aggressive osseous lesion. Sinuses/Orbits: No mass or acute finding within the imaged orbits. Small-volume secretions within the left sphenoid sinus. Mild mucosal  thickening within the bilateral ethmoid sinuses. Other: Bilateral mastoid effusions. CT CERVICAL SPINE FINDINGS Alignment: Nonspecific straightening of the expected cervical lordosis. No significant spondylolisthesis. Skull base and vertebrae: The basion-dental and atlanto-dental intervals are maintained.No evidence of acute fracture to the cervical spine. Probable facet ankylosis at C7-T1 and T1-T2. Soft tissues and spinal canal: No prevertebral fluid or swelling. No visible canal hematoma. Disc levels: Cervical spondylosis with multilevel disc space narrowing, disc bulges/central disc protrusions, posterior disc osteophyte complexes and uncovertebral hypertrophy. Disc space narrowing is greatest at C5-C6 (moderate-to-advanced) and C6-C7 (advanced). No appreciable high-grade spinal canal stenosis. Multilevel bony neural foraminal narrowing. Multilevel ventrolateral osteophytes. Upper chest: Separately reported on same day CT chest/abdomen/pelvis. Other: Enlarged heterogeneous thyroid gland. Mild enlargement of the right thyroid lobe also noted. IMPRESSION: CT head: 1.  No evidence of an acute intracranial abnormality. 2. 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 3. Background parenchymal atrophy and chronic small vessel ischemic disease. 4. Paranasal sinus disease and bilateral mastoid effusions (in the presence of life-support tubes). CT cervical spine: 1. No evidence of an acute cervical spine fracture. 2. Nonspecific straightening of the expected cervical lordosis. 3. Cervical spondylosis as described. 4. Heterogeneous and mildly enlarged thyroid gland. Given the  patient's age, a non-emergent thyroid ultrasound may be obtained for further evaluation as clinically appropriate. Reference: J Am Coll Radiol. 2015 Feb;12(2): 143-50. Electronically Signed   By: Jackey Loge D.O.   On: 03/26/2023 13:58   CT Cervical Spine Wo Contrast  Result Date: 03/26/2023 CLINICAL DATA:  Provided history: Facial trauma, blunt. Ataxia, head trauma. Additional history provided: Fall. Patient subsequently became apneic/pulseless. EXAM: CT HEAD WITHOUT CONTRAST CT CERVICAL SPINE WITHOUT CONTRAST TECHNIQUE: Multidetector CT imaging of the head and cervical spine was performed following the standard protocol without intravenous contrast. Multiplanar CT image reconstructions of the cervical spine were also generated. RADIATION DOSE REDUCTION: This exam was performed according to the departmental dose-optimization program which includes automated exposure control, adjustment of the mA and/or kV according to patient size and/or use of iterative reconstruction technique. COMPARISON:  Head CT 07/04/2021.  Cervical spine CT 07/04/2021. FINDINGS: CT HEAD FINDINGS Brain: Generalized parenchymal atrophy. 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 Background mild patchy and ill-defined hypoattenuation within the cerebral white matter, nonspecific but compatible with chronic small ischemic disease. There is no acute intracranial hemorrhage. No acute demarcated cortical infarct. No extra-axial fluid collection. No evidence of an intracranial mass. No midline shift. Vascular: No hyperdense vessel. Atherosclerotic calcifications. Skull: No calvarial fracture or aggressive osseous lesion. Sinuses/Orbits: No mass or acute finding within the imaged orbits. Small-volume secretions within the left sphenoid sinus. Mild mucosal thickening within the bilateral ethmoid sinuses. Other: Bilateral mastoid effusions. CT CERVICAL SPINE FINDINGS Alignment: Nonspecific  straightening of the expected cervical lordosis. No significant spondylolisthesis. Skull base and vertebrae: The basion-dental and atlanto-dental intervals are maintained.No evidence of acute fracture to the cervical spine. Probable facet ankylosis at C7-T1 and T1-T2. Soft tissues and spinal canal: No prevertebral fluid or swelling. No visible canal hematoma. Disc levels: Cervical spondylosis with multilevel disc space narrowing, disc bulges/central disc protrusions, posterior disc osteophyte complexes and uncovertebral hypertrophy. Disc space narrowing is greatest at C5-C6 (moderate-to-advanced) and C6-C7 (advanced). No appreciable high-grade spinal canal stenosis. Multilevel bony neural foraminal narrowing. Multilevel ventrolateral osteophytes. Upper chest: Separately reported on same day CT chest/abdomen/pelvis. Other: Enlarged heterogeneous thyroid gland. Mild enlargement of the right thyroid lobe also noted. IMPRESSION: CT head:  1.  No evidence of an acute intracranial abnormality. 2. 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 3. Background parenchymal atrophy and chronic small vessel ischemic disease. 4. Paranasal sinus disease and bilateral mastoid effusions (in the presence of life-support tubes). CT cervical spine: 1. No evidence of an acute cervical spine fracture. 2. Nonspecific straightening of the expected cervical lordosis. 3. Cervical spondylosis as described. 4. Heterogeneous and mildly enlarged thyroid gland. Given the patient's age, a non-emergent thyroid ultrasound may be obtained for further evaluation as clinically appropriate. Reference: J Am Coll Radiol. 2015 Feb;12(2): 143-50. Electronically Signed   By: Jackey Loge D.O.   On: 03/26/2023 13:58   DG Chest Portable 1 View  Result Date: 03/26/2023 CLINICAL DATA:  Post intubation. EXAM: PORTABLE CHEST 1 VIEW COMPARISON:  Radiographs 03/17/2023 and 07/04/2021. FINDINGS: 1200 hours. Tip of the endotracheal tube is  approximately 1.5 cm above the carina. Enteric tube projects below the diaphragm, tip not visualized. The heart is enlarged, but stable. There is aortic atherosclerosis. New pulmonary edema with asymmetric right basilar airspace disease and a possible small right pleural effusion. No evidence of pneumothorax. No acute osseous findings are evident. External pacer and telemetry leads overlie the chest. IMPRESSION: 1. Tip of the endotracheal tube is just above the carina. Consider withdrawing 2-3 cm for more optimal positioning. 2. New pulmonary edema with asymmetric right basilar airspace disease and possible small right pleural effusion. Findings suggest congestive heart failure or aspiration. Electronically Signed   By: Carey Bullocks M.D.   On: 03/26/2023 13:30   DG Abdomen 1 View  Result Date: 03/26/2023 CLINICAL DATA:  OGT placement. EXAM: ABDOMEN - 1 VIEW COMPARISON:  None available. FINDINGS: Enteric tube tip projects over the distal stomach. Mild gaseous distention of small-bowel loops in the pelvis. IMPRESSION: Enteric tube tip projects over the distal stomach. Mild gaseous distention of small-bowel loops in the pelvis, nonspecific. Electronically Signed   By: Orvan Falconer M.D.   On: 03/26/2023 12:19    Procedures Procedure Name: Intubation Date/Time: 03/26/2023 4:58 PM  Performed by: Rolla Flatten, MDPre-anesthesia Checklist: Patient identified, Emergency Drugs available, Suction available, Patient being monitored and Timeout performed Oxygen Delivery Method: Ambu bag Preoxygenation: Pre-oxygenation with 100% oxygen Induction Type: Rapid sequence Ventilation: Mask ventilation without difficulty and Nasal airway inserted- appropriate to patient size Laryngoscope Size: 3 and Mac Grade View: Grade II Tube size: 7.5 mm Number of attempts: 1 Airway Equipment and Method: Rigid stylet and Video-laryngoscopy Placement Confirmation: ETT inserted through vocal cords under direct vision,  Positive ETCO2, CO2 detector and Breath sounds checked- equal and bilateral Secured at: 24 cm Tube secured with: ETT holder     Medications Ordered in ED Medications  vancomycin (VANCOCIN) 2,250 mg in sodium chloride 0.9 % 500 mL IVPB (2,250 mg Intravenous New Bag/Given 03/26/23 1452)  atorvastatin (LIPITOR) tablet 80 mg (has no administration in time range)  ipratropium-albuterol (DUONEB) 0.5-2.5 (3) MG/3ML nebulizer solution 3 mL (has no administration in time range)  docusate (COLACE) 50 MG/5ML liquid 100 mg (has no administration in time range)  polyethylene glycol (MIRALAX / GLYCOLAX) packet 17 g (has no administration in time range)  heparin injection 5,000 Units (5,000 Units Subcutaneous Given 03/26/23 1555)  famotidine (PEPCID) tablet 20 mg (20 mg Per Tube Not Given 03/26/23 1433)  pantoprazole (PROTONIX) injection 40 mg (has no administration in time range)  insulin aspart (novoLOG) injection 0-15 Units (5 Units Subcutaneous Given 03/26/23 1555)  norepinephrine (LEVOPHED) 4mg  in (  0.016 mg/mL) premix infusion ( Intravenous Not Given 03/26/23 1517)  arformoterol (BROVANA) nebulizer solution 15 mcg (15 mcg Nebulization Given 03/26/23 1407)  budesonide (PULMICORT) nebulizer solution 0.25 mg (0.25 mg Nebulization Given 03/26/23 1407)  revefenacin (YUPELRI) nebulizer solution 175 mcg (175 mcg Nebulization Given 03/26/23 1407)  propofol (DIPRIVAN) 1000 MG/100ML infusion (has no administration in time range)  ondansetron (ZOFRAN) injection 4 mg (has no administration in time range)  acetaminophen (TYLENOL) tablet 650 mg ( Oral See Alternative 03/26/23 1537)    Or  acetaminophen (TYLENOL) 160 MG/5ML solution 650 mg (650 mg Per Tube Not Given 03/26/23 1537)    Or  acetaminophen (TYLENOL) suppository 650 mg ( Rectal See Alternative 03/26/23 1537)  busPIRone (BUSPAR) tablet 30 mg (has no administration in time range)    Or  busPIRone (BUSPAR) tablet 30 mg (has no administration in  time range)  magnesium sulfate IVPB 2 g 50 mL (has no administration in time range)  fentaNYL (SUBLIMAZE) injection 25 mcg (has no administration in time range)  fentaNYL (SUBLIMAZE) injection 25-100 mcg (has no administration in time range)  vancomycin (VANCOCIN) IVPB 1000 mg/200 mL premix (has no administration in time range)  ceFEPIme (MAXIPIME) 2 g in sodium chloride 0.9 % 100 mL IVPB (has no administration in time range)  metroNIDAZOLE (FLAGYL) IVPB 500 mg (has no administration in time range)  Chlorhexidine Gluconate Cloth 2 % PADS 6 each (6 each Topical Given 03/26/23 1517)  Oral care mouth rinse (15 mLs Mouth Rinse Given 03/26/23 1555)  Oral care mouth rinse (has no administration in time range)  midazolam (VERSED) 2 MG/2ML injection (has no administration in time range)  PHENYLephrine 80 mcg/ml in normal saline Adult IV Push Syringe (For Blood Pressure Support) (160 mcg Intravenous Given 03/26/23 1142)  norepinephrine (LEVOPHED) injection (0 mcg/kg/min  113.4 kg (Order-Specific) Intravenous Stopped 03/26/23 1156)  etomidate (AMIDATE) injection (10 mg Intravenous Given 03/26/23 1145)  rocuronium (ZEMURON) injection (100 mg Intravenous Given 03/26/23 1145)  sodium chloride 0.9 % bolus 1,000 mL (0 mLs Intravenous Stopped 03/26/23 1217)  metroNIDAZOLE (FLAGYL) IVPB 500 mg (0 mg Intravenous Stopped 03/26/23 1539)  ceFEPIme (MAXIPIME) 2 g in sodium chloride 0.9 % 100 mL IVPB (0 g Intravenous Stopped 03/26/23 1450)  sodium chloride 0.9 % bolus 600 mL (0 mLs Intravenous Stopped 03/26/23 1450)  midazolam (VERSED) injection 1 mg (1 mg Intravenous Given 03/26/23 1615)  midazolam (VERSED) injection 1 mg (1 mg Intravenous Given 03/26/23 1628)    ED Course/ Medical Decision Making/ A&P Clinical Course as of 03/26/23 1639  Mon Mar 26, 2023  1247 CT Head Wo Contrast No evidence of intracranial hemorrhage  [JR]  1247 CT Cervical Spine Wo Contrast Patient has notable osteophytes but otherwise  alignment appears normal without significant canal narrowing [JR]  1248 DG Chest Portable 1 View ET tube in appropriate position.  Diffuse atelectasis.  No pneumothorax. [JR]    Clinical Course User Index [JR] Rolla Flatten, MD                                 Medical Decision Making Amount and/or Complexity of Data Reviewed Labs: ordered. Radiology: ordered and independent interpretation performed. Decision-making details documented in ED Course. ECG/medicine tests: ordered and independent interpretation performed.  Risk Prescription drug management. Decision regarding hospitalization.   77 year old female presents here following reported out-of-hospital cardiac arrest.  ROSC was achieved in the field via CPR.  Unknown rhythm associated  with arrest.  On arrival here, patient is GCS 3.  Unresponsive to noxious stimuli.  She has bradypneic, with poor respiratory effort.  She is receiving respiratory assistance with BVM ventilations.  On initial evaluation, the patient has bilateral breath sounds.  She has palpable femoral and carotid pulses.  Blood pressure with EMS was reportedly stable.  However, patient noted to be hypotensive to systolic of 80 on arrival here.  Fluid resuscitation with 1 L of LR started.  Patient started on Levophed.  Given her poor mental status, she will require intubation for airway protection.  To avoid.  The patient in cardiac arrest, will also give 160 mg Neo-Synephrine.  Patient intubated emergently for airway protection.  I personally reviewed the patient's postintubation chest x-ray, which demonstrates ET tube termination just above the carina.  No pneumothorax noted.  Atelectasis with pulmonary edema noted on imaging.  Broad-spectrum labs obtained including CMP, lactic, CBC, troponin, UA.  Will also obtain CT head without contrast, CT C-spine without contrast, CTA chest PE study, and CT abdomen pelvis with contrast.  Patient started on propofol for sedation.  Able to  titrate Levophed down to 5 mcg/min.  Patient emergently taken to CT scan.  Unfortunately, patient's peripheral IV access infiltrated while in CT scan.  Unable to obtain CTA chest or CT abdomen/pelvis with contrast.  Imaging without contrast was obtained.  On my depend interpretation, imaging is not consistent with acute intracranial hemorrhage.  Do not appreciate any C-spine fractures.  CT CAP demonstrates bilateral pleural effusions.  Otherwise, no emergent findings noted.  Laboratory workup notable for initial troponin that is elevated at 33, which is consistent with receiving CPR.  Lactic acid elevated at 6.8, which I suspect is related to tissue ischemia in the setting of cardiac arrest.  UA not consistent with UTI. CBC with leukocytosis and normocytic anemia.  Hemoglobin appears stable when compared to recent labs.  CMP also notable for transaminitis that is likely related to cardiac arrest in the setting of poor perfusion.  I independently reviewed the patient's ECG, which demonstrates sinus rhythm.  Rate of 107.  Prolonged QTc of 588 ms, otherwise normal intervals.  Infrequent PVCs noted.  No ST segment changes.  Overall, nonischemic ECG.  The etiology for patient's cardiac arrest is unclear thus far.  Given unclear etiology of events, will cover patient broadly for potential infectious sources.  Blood culture obtained.  Patient started on IV vancomycin, Flagyl, cefepime. She will continue to require mechanical ventilation for respiratory support, necessitating ICU level care.  Patient discussed with critical care team, who have accepted her for admission.  Patient's presentation is most consistent with acute presentation with potential threat to life or bodily function.         Final Clinical Impression(s) / ED Diagnoses Final diagnoses:  Cardiac arrest (HCC)  Transaminitis  Elevated troponin    Rx / DC Orders ED Discharge Orders     None         Rolla Flatten, MD 03/26/23  1657    Rolla Flatten, MD 03/26/23 1700    Rolla Flatten, MD 03/26/23 1702    Alvira Monday, MD 03/26/23 2344

## 2023-03-26 NOTE — Sepsis Progress Note (Signed)
Sepsis protocol monitored by eLink ?

## 2023-03-26 NOTE — Progress Notes (Signed)
Echocardiogram 2D Echocardiogram has been performed.  Courtney [name] 03/26/2023, 2:24 PM

## 2023-03-26 NOTE — Progress Notes (Signed)
RT pulled ETT back 3cm per CCM, from 25 to 22 @ lip.

## 2023-03-26 NOTE — Procedures (Signed)
Patient Name: Courtney [name]  MRN: [id number]  Epilepsy Attending: Charlsie Quest  Referring Physician/Provider: Cristopher Peru, PA-C  Date: 03/26/2023 Duration: 22.22 mins  Patient history: 77yo F s/p cardiac arrest getting eeg to evaluate for seizure  Level of alertness:  comatose  AEDs during EEG study: Propofol, Versed  Technical aspects: This EEG study was done with scalp electrodes positioned according to the 10-20 International system of electrode placement. Electrical activity was reviewed with band pass filter of 1-70Hz , sensitivity of 7 uV/mm, display speed of 17mm/sec with a 60Hz  notched filter applied as appropriate. EEG data were recorded continuously and digitally stored.  Video monitoring was available and reviewed as appropriate.  Description: EEG showed continuous generalized 3 to 6 Hz theta-delta slowing. Patient was noted to have chewing movements at times during the study. Concomitant EEG before, during and after the event did not show any EEG changes suggest seizure. Hyperventilation and photic stimulation were not performed.     ABNORMALITY - Continuous slow, generalized  IMPRESSION: This study is suggestive of moderate to severe diffuse encephalopathy. No seizures or epileptiform discharges were seen throughout the recording.  Patient was noted to have chewing movements at times during the study without concomitant EEG change. These events were NOT epileptic.  Royelle Hinchman Annabelle Harman

## 2023-03-26 NOTE — Procedures (Signed)
Central Venous Catheter Insertion Procedure Note  Courtney [name]  [id number]  1945-06-15  Date:03/26/23  Time:4:46 PM   Provider Performing:[name] [name]  [name]   Procedure: Insertion of Non-tunneled Central Venous Catheter(36556) without US guidance  Indication(s) Medication administration and Difficult access  Consent Risks of the procedure as well as the alternatives and risks of each were explained to the patient and/or caregiver.  Consent for the procedure was obtained and is signed in the bedside chart  Anesthesia Topical only with 1% lidocaine   Timeout Verified patient identification, verified procedure, site/side was marked, verified correct patient position, special equipment/implants available, medications/allergies/relevant history reviewed, required imaging and test results available.  Sterile Technique Maximal sterile technique including full sterile barrier drape, hand hygiene, sterile gown, sterile gloves, mask, hair covering, sterile ultrasound probe cover (if used).  Procedure Description Area of catheter insertion was cleaned with chlorhexidine and draped in sterile fashion.  Without real-time ultrasound guidance a central venous catheter was placed into the left subclavian vein. Nonpulsatile blood flow and easy flushing noted in all ports.  The catheter was sutured in place and sterile dressing applied.  Complications/Tolerance None; patient tolerated the procedure well. Chest X-ray is ordered to verify placement for internal jugular or subclavian cannulation.   Chest x-ray is not ordered for femoral cannulation.  EBL Minimal  Specimen(s) None   Completed with Rutherford Guys, PA-C

## 2023-03-26 NOTE — Progress Notes (Signed)
Reported by ED RN that patient had infiltrated IV's in bilateral AC from contrast in CT. Notified pharmacy on arrival to unit. No reversal agent needed at this time. Elevating both extremities. Ice packs placed to sites bilaterally

## 2023-03-26 NOTE — ED Notes (Signed)
Attempted blood cultures with no success. Antibiotics started at this time.

## 2023-03-26 NOTE — Progress Notes (Addendum)
   03/26/23 1147  Spiritual Encounters  Type of Visit Initial  Care provided to: Patient  Conversation partners present during encounter Nurse  Reason for visit Trauma  OnCall Visit No   Chaplain responding to CPR Pt being brought into Trauma A.  Pt brought in, non-responsive, thus chaplain not able to speak with her.  Medical team is providing care.   Rn from desk said husband was on the phone, but he was not present at bedside.  There are currently no support personas present.  Chaplain services remain available by Spiritual Consult or for emergent cases, paging [phone]  Chaplain Raelene Bott, MDiv [email or web address] [email or web address] [phone]

## 2023-03-26 NOTE — Progress Notes (Signed)
ED Pharmacy Antibiotic Sign Off An antibiotic consult was received from an ED provider for vancomycin and aztreonam per pharmacy dosing for sepsis. A chart review was completed to assess appropriateness. Noted PCN allergy - patient has tolerated CFTX several times within the past few years. Will change aztreonam to cefepime per protocol.  The following one time order(s) were placed:  Cefepime 2g x1  Vancomycin 2250mg  x1   Further antibiotic and/or antibiotic pharmacy consults should be ordered by the admitting provider if indicated.   Thank you for allowing pharmacy to be a part of this patient's care.   Estill Batten, PharmD, BCCCP  Clinical Pharmacist 03/26/23 12:15 PM

## 2023-03-26 NOTE — ED Triage Notes (Signed)
Patient arrived by [facility] from facility. Staff report that patient had fall earlier today and went back to room and they heard her talking. After staff heard quietness they went to bedside after 5 minutes and report apenic and pulseless. No AED found on patient, pulses present on ems arrival. Arrived with IO left tibial by ems-cbg 120 Ems attempted IGEL and unsuccessful due to reported heavy secretions.  Arrived being bagged and unresponsiveness

## 2023-03-26 NOTE — Progress Notes (Signed)
EEG complete - results pending 

## 2023-03-26 NOTE — Progress Notes (Signed)
RT transported pt from ED Trauma A to CT and back without any complications. RN @ bedside.

## 2023-03-26 NOTE — IPAL (Signed)
  Interdisciplinary Goals of Care Family Meeting   Date carried out: 03/26/2023  Location of the meeting: Phone conference  Member's involved: Family Member or next of kin  Durable Power of Attorney or acting medical decision maker: husband, Niyati Amble    Discussion: We discussed goals of care for Courtney [name] .  I updated Remi Haggard, over the phone, on recent events of suspected cardiac arrest, now in ICU, intubated. Discussed that work up was not completed yet. He lives at home while patient lives in facility. He is understandably concerned about her condition at this time and I instructed him on her physical location so he can come visit with her.. Patient has not discussed with the husband her wishes. He is adamant to do what we can to keep her alive at this time and wishes to have full code status including CPR, intubation, live-saving medications and interventions. On our phone conversation he also consented to CVC line placement which was witnessed by RN.   Code status:   Code Status: Full Code   Disposition: Continue current acute care  Time spent for the meeting: 20    Cristopher Peru, PA-C  03/26/2023, 5:51 PM

## 2023-03-26 NOTE — ED Notes (Signed)
Pt has had 2 ultrasound IV placed by MD and both infiltrated in CT scan. MD is aware and has seen the swelling to both arms. Intact pulses noted to bilateral upper extremities at this time. ICU came by and attempted and ultrasound IV with no success. IV team came by and attempted an ultrasound IV with no success. ICU team is aware. PT currently has an IO to left leg.

## 2023-03-26 NOTE — Progress Notes (Signed)
Pharmacy Antibiotic Note  Courtney [name] is a 77 y.o. female admitted on 03/26/2023 with pneumonia.  Pharmacy has been consulted for vancomycin and cefepime dosing.  Plan: Vancomycin 2250mg  IV x1 then 1000mg  IV q24 hour (eAUC 538) Cefepime 2G IV q12 hours   Height: 5\' 3"  (160 cm) Weight: 113.4 kg (250 lb) IBW/kg (Calculated) : 52.4  Temp (24hrs), Avg:96.1 F (35.6 C), Min:95 F (35 C), Max:96.6 F (35.9 C)  Recent Labs  Lab 03/26/23 1135 03/26/23 1136 03/26/23 1144 03/26/23 1438  WBC 13.5*  --   --   --   CREATININE  --  1.08* 1.10*  --   LATICACIDVEN  --   --  6.8* 2.7*    Estimated Creatinine Clearance: 50.3 mL/min (A) (by C-G formula based on SCr of 1.1 mg/dL (H)).    Allergies  Allergen Reactions   Egg-Derived Products Swelling    Listed on [facility] 07/04/21 Patient reports had face swelling.    Penicillins Other (See Comments)    Did it involve swelling of the face/tongue/throat, SOB, or low BP? No Did it involve sudden or severe rash/hives, skin peeling, or any reaction on the inside of your mouth or nose? Yes Did you need to seek medical attention at a hospital or doctor's office? yes When did it last happen?      more than 10 years If all above answers are "NO", may proceed with cephalosporin use.     Antimicrobials this admission: Vancomycin 11/25>> Cefpime 11/25>> Flagyl 11/25  Thank you for allowing pharmacy to be a part of this patient's care.  Toniann Fail Oluwatobi Visser 03/26/2023 2:44 PM

## 2023-03-26 NOTE — H&P (Addendum)
NAME:  Courtney [name], MRN:  [id number], DOB:  07/09/1945, LOS: 0 ADMISSION DATE:  03/26/2023, CONSULTATION DATE:  03/26/2023 REFERRING MD:  Dalene Seltzer, EDP, CHIEF COMPLAINT:  Cardiac arrest    History of Present Illness:  77 year old female with past medical history of asthma, COPD, diabetes, hyperlipidemia, hypertension, MI, CHF who presents to ED on 03/26/23 with cardiac arrest from facility. Per facility/EMS report, patient fell earlier today. She went back to her room and staff heard her talking. After she was quiet, they checked on her about 5 minutes later finding her apneic and pulseless. Per EDP, when fire department arrival which was about 5 minutes from call to 911, patient had ROSC/pulse. There was reported seizure-like activity at that time. Reported GCS was 3 at that time.   In the ED, GCS low, she was intubated for airway protection. Started on low dose propofol, peripheral levophed for hypotension after 1L IVF resuscitation with systolic in the 80s. Labs with WBC 13.5, hgb 9.9, troponin 33, glucose 188, Cr 1.08, AST/ALT/Alkphos 704/729/167 respectively, lactic 6.8.  Initial CXR with evidence of pulmonary edema, ?RLL infiltrate. CT head, C-spine, CT CAP without contrast obtained after multiple infiltrations of contrast.  On chart review, seen in ED on 11/16 for chest pain and discharged. Admitted 07/04/21 for fall, CHF. She was being seen in 2022 by pulmonology for COPD. Was to have sleep study for possible OSA.  Pertinent  Medical History  asthma, COPD, diabetes, hyperlipidemia, hypertension, MI, CHF  Significant Hospital Events: Including procedures, antibiotic start and stop dates in addition to other pertinent events   11/25: admit to PCCM for cardiac arrest   Interim History / Subjective:  Patient unable to participate in subjective portion of the exam as she is intubated. Intermittently opens eyes and moves her extremities, not following commands  Objective   Blood pressure  109/60, pulse 97, resp. rate 19, height 5\' 3"  (1.6 m), weight 113.4 kg, SpO2 100%.    Vent Mode: PRVC FiO2 (%):  [100 %] 100 % Set Rate:  [20 bmp] 20 bmp Vt Set:  [420 mL] 420 mL PEEP:  [5 cmH20] 5 cmH20 Plateau Pressure:  [29 cmH20] 29 cmH20   Intake/Output Summary (Last 24 hours) at 03/26/2023 1232 Last data filed at 03/26/2023 1217 Gross per 24 hour  Intake 1012 ml  Output --  Net 1012 ml   Filed Weights   03/26/23 1158  Weight: 113.4 kg    Examination: General: older female, laying in bed , vented, critically ill appearing HENT: anicteric sclera, mucous membranes moist, pupils equal and sluggish, ETT, OGT Lungs: vented breath sounds, rhonchi throughout, diminished right lower lobe. No wheezing Cardiovascular: s1/s2. Regular rate and rhythm. No appreciable murmur, rub gallop Abdomen: rounded, soft, +BS, colostomy pouch LLQ Extremities: no pitting edema, cool extremities Neuro: weak cough, gag. Moves all extremities spontaneously but non purposefully, not following commands. No focal findings GU: foley   Resolved Hospital Problem list     Assessment & Plan:  Cardiac arrest Shock; unwitnessed arrested. Unclear downtime although facility is reporting about 5 minutes between hearing her taking and finding her pulseless. They performed CPR for about 5 minutes when fire dept arrived she had ROSC. Never re-lost pulses. However, poor neurological function in ED per EDP. Initial CT head without obvious bleed. Lower suspicion for massive PE given relative hemodynamic stability, no difficulty ventilating. She may have aspirated and had respiratory arrest prior to suspected cardiac arrest. CT chest with RLL effusion and opacities. Qtc 588, ?  torsades event. Now with shock, hypotension which may be cardiogenic vs distributive.  - f/u echo, BLE venous duplex and BNP to help eval for possibility of PE as unable to obtain CTA PE study - EEG now  - f/u ABG - con't broad coverage antibiotics  for sepsis, f/u bcx  - trend troponin, lactic  - con't levophed titrated to MAP goal >65 - TTM/normothermia protocol  Prolonged Qtc; on initial read Qtc 588. Do not see history of same. May have precipitated the above? - f/u  mag, phos; K is stable  - avoid Qtc prolonging agents   Acute respiratory failure with hypoxia; likely aspiration event given CXR and CT chest imaging  - f/u ABG, legionella, strep pneumo, RVP, MRSA PCR, tracheal aspirate - con't vanc/cefepime/flagyl for broad coverage until narrowing - con't full mechanical vent support - lung protective ventilation 6-8cc/kg Vt - VAP and PAD bundle in place  - titrate FiO2 to sat goal >92  - maintain peak/plats <30, driving pressures <16    Hypomagnesemia  - replete  - trend mag   Elevated Transaminases; AST/ALT/Alkphos 704/729/167 respectively; likely shock liver  - trend cmp - RUQ [place] if continues to rise   COPD  Tobacco use  - start brovana, pulmicort yupelri  - prn duonebs   Type 2 Diabetes Mellitus  - f/u A1c  - SSI coverage, anticipate adding on semglee  - q4h CBG   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 Mildly enlarged RV - f/u echo  - hold GDMT until clinically stable   Hypertension  - hold antihypertensives as currently hypotensive on low dose vasopressors   CAD Hyperlipidemia  - start home asa 81 mg, atorvastatin 80mg  daily    Best Practice (right click and "Reselect all SmartList Selections" daily)   Diet/type: NPO DVT prophylaxis: prophylactic heparin  Pressure ulcer(s): none present on admission GI prophylaxis: PPI Lines: Central line and yes and it is still needed Foley:  Yes, and it is still needed Code Status:  full code Last date of multidisciplinary goals of care discussion [11/25 spoke with husband who wishes her to be full code]  Labs   CBC: Recent Labs  Lab 03/26/23 1135 03/26/23 1144  WBC 13.5*  --    HGB 9.9* 11.9*  HCT 36.2 35.0*  MCV 90.0  --   PLT 262  --     Basic Metabolic Panel: Recent Labs  Lab 03/26/23 1136 03/26/23 1144  NA 144 144  K 4.2 4.3  CL 101 101  CO2 28  --   GLUCOSE 188* 188*  BUN 17 22  CREATININE 1.08* 1.10*  CALCIUM 9.4  --    GFR: Estimated Creatinine Clearance: 50.3 mL/min (A) (by C-G formula based on SCr of 1.1 mg/dL (H)). Recent Labs  Lab 03/26/23 1135 03/26/23 1144  WBC 13.5*  --   LATICACIDVEN  --  6.8*    Liver Function Tests: Recent Labs  Lab 03/26/23 1136  AST 704*  ALT 729*  ALKPHOS 167*  BILITOT 0.7  PROT 6.9  ALBUMIN 3.3*   No results for input(s): "LIPASE", "AMYLASE" in the last 168 hours. No results for input(s): "AMMONIA" in the last 168 hours.  ABG    Component Value Date/Time   PHART 7.293 (L) 10/18/2019 0403   PCO2ART 58.0 (H) 10/18/2019 0403   PO2ART 60.1 (L) 10/18/2019 0403   HCO3 31.4 (H) 02/04/2021 0411   TCO2 32 03/26/2023 1144  O2SAT 98.0 02/04/2021 0411     Coagulation Profile: No results for input(s): "INR", "PROTIME" in the last 168 hours.  Cardiac Enzymes: No results for input(s): "CKTOTAL", "CKMB", "CKMBINDEX", "TROPONINI" in the last 168 hours.  HbA1C: Hgb A1c MFr Bld  Date/Time Value Ref Range Status  07/04/2021 05:48 PM 6.6 (H) 4.8 - 5.6 % Final    Comment:    (NOTE) Pre diabetes:          5.7%-6.4%  Diabetes:              >6.4%  Glycemic control for   <7.0% adults with diabetes   02/04/2021 04:00 AM 9.1 (H) 4.8 - 5.6 % Final    Comment:    (NOTE) Pre diabetes:          5.7%-6.4%  Diabetes:              >6.4%  Glycemic control for   <7.0% adults with diabetes     CBG: Recent Labs  Lab 03/26/23 1135  GLUCAP 181*    Review of Systems:   As above  Past Medical History:  She,  has a past medical history of Anemia, Arthritis, Asthma, CAD (coronary artery disease), COPD (chronic obstructive pulmonary disease) (HCC), Diabetes mellitus, Hyperlipidemia, Hypertension,  Myocardial infarct (HCC), Postmenopausal atrophic vaginitis, and Restless leg syndrome.   Surgical History:   Past Surgical History:  Procedure Laterality Date   BIOPSY  02/09/2021   Procedure: BIOPSY;  Surgeon: Imogene Burn, MD;  Location: [facility] ENDOSCOPY;  Service: Gastroenterology;;   CHOLECYSTECTOMY  1970's   ESOPHAGOGASTRODUODENOSCOPY (EGD) WITH PROPOFOL N/A 02/09/2021   Procedure: ESOPHAGOGASTRODUODENOSCOPY (EGD) WITH PROPOFOL;  Surgeon: Imogene Burn, MD;  Location: [facility] ENDOSCOPY;  Service: Gastroenterology;  Laterality: N/A;   INCISION AND DRAINAGE ABSCESS Right 06/27/2018   Procedure: 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.;  Surgeon: Romie Levee, MD;  Location: [facility];  Service: General;  Laterality: Right;   INCISION AND DRAINAGE PERIRECTAL ABSCESS N/A 06/25/2018   Procedure: IRRIGATION AND DEBRIDEMENT PERIRECTAL ABCESS;  Surgeon: Romie Levee, MD;  Location: [facility];  Service: General;  Laterality: N/A;   IRRIGATION AND DEBRIDEMENT ABSCESS N/A 07/01/2018   Procedure: IRRIGATION AND DEBRIDEMENT ABSCESS;  Surgeon: Emelia Loron, MD;  Location: [facility];  Service: General;  Laterality: N/A;   OTHER SURGICAL HISTORY  1970's   hysterectomy   WOUND DEBRIDEMENT N/A 07/03/2018   Procedure: DEBRIDEMENT PELVIS WITH DRESSING CHANGE;  Surgeon: Emelia Loron, MD;  Location: [facility];  Service: General;  Laterality: N/A;     Social History:   reports that she quit smoking about 4 years ago. Her smoking use included cigarettes. She has never been exposed to tobacco smoke. She has never used smokeless tobacco. She reports that she does not drink alcohol and does not use drugs.   Family History:  Her family history includes Hypertension in her brother. There is no history of Heart attack.   Allergies Allergies  Allergen Reactions   Egg-Derived Products Swelling    Listed on [facility] 07/04/21 Patient reports had face swelling.    Penicillins Other (See Comments)    Did  it involve swelling of the face/tongue/throat, SOB, or low BP? No Did it involve sudden or severe rash/hives, skin peeling, or any reaction on the inside of your mouth or nose? Yes Did you need to seek medical attention at a hospital or doctor's office? yes When did it last happen?      more than 10 years If  all above answers are "NO", may proceed with cephalosporin use.      Home Medications  Prior to Admission medications   Medication Sig Start Date End Date Taking? Authorizing Provider  acetaminophen (TYLENOL) 325 MG tablet Take 650 mg by mouth every 8 (eight) hours as needed (pain).    [provider]  amLODipine (NORVASC) 10 MG tablet Take 1 tablet (10 mg total) by mouth daily. 07/09/18   Dhungel, Nishant, MD  arformoterol (BROVANA) 15 MCG/2ML NEBU Take 2 mLs (15 mcg total) by nebulization 2 (two) times daily. Patient not taking: Reported on 07/05/2021 10/21/19   Rhetta Mura, MD  aspirin EC 81 MG EC tablet Take 1 tablet (81 mg total) by mouth daily. 07/09/18   Dhungel, Theda Belfast, MD  atorvastatin (LIPITOR) 80 MG tablet Take 80 mg by mouth at bedtime.    [provider]  budesonide (PULMICORT) 0.25 MG/2ML nebulizer solution Take 0.25 mg by nebulization 2 (two) times daily. Patient not taking: Reported on 07/05/2021    [provider]  Cholecalciferol (VITAMIN D-3) 125 MCG (5000 UT) TABS Take 5,000 Units by mouth daily.    [provider]  empagliflozin (JARDIANCE) 10 MG TABS tablet Take 1 tablet (10 mg total) by mouth daily before breakfast. 07/08/21   Jerald Kief, MD  ferrous sulfate 325 (65 FE) MG tablet Take 1 tablet (325 mg total) by mouth 2 (two) times daily with a meal. Patient taking differently: Take 325 mg by mouth daily. 10/21/19   Rhetta Mura, MD  fluticasone (FLONASE) 50 MCG/ACT nasal spray Place 1 spray into both nostrils daily. 09/19/19   [provider]  furosemide (LASIX) 40 MG tablet Take 1 tablet (40 mg total) by  mouth 2 (two) times daily. 07/08/21 08/07/21  Jerald Kief, MD  glucagon (GLUCAGEN HYPOKIT) 1 MG SOLR injection Inject 1 mg into the muscle daily as needed for low blood sugar.    [provider]  Glycopyrrolate-Formoterol (BEVESPI AEROSPHERE) 9-4.8 MCG/ACT AERO Inhale 2 puffs into the lungs 2 (two) times daily.    [provider]  hydrOXYzine (ATARAX) 25 MG tablet Take 25 mg by mouth 2 (two) times daily. For itching    [provider]  insulin aspart (NOVOLOG FLEXPEN) 100 UNIT/ML FlexPen Inject 0-17 Units into the skin See admin instructions. Inject 0-16 units subcutaneously before meals and at bedtime per sliding scale: CBG 0-150 0 units, 151-200 4 units, 201-250 6 units, 251-300 8 units, 301-350 10 units, 351-400 14 units, 401-450 16 units, >450 contact MD    [provider]  insulin glargine (LANTUS) 100 unit/mL SOPN Inject 21 Units into the skin 2 (two) times daily.    [provider]  ipratropium-albuterol (DUONEB) 0.5-2.5 (3) MG/3ML SOLN Take 3 mLs by nebulization every 6 (six) hours as needed. Patient taking differently: Take 3 mLs by nebulization 3 (three) times daily. 07/08/18   Dhungel, Nishant, MD  melatonin 3 MG TABS tablet Take 1 tablet (3 mg total) by mouth at bedtime. Patient taking differently: Take 6 mg by mouth at bedtime. 10/21/19   Rhetta Mura, MD  memantine (NAMENDA) 5 MG tablet Take 5 mg by mouth at bedtime. Patient not taking: Reported on 07/05/2021    [provider]  metoprolol succinate (TOPROL-XL) 25 MG 24 hr tablet Take 25 mg by mouth daily.    [provider]  nitroGLYCERIN (NITROSTAT) 0.4 MG SL tablet Place 0.4 mg under the tongue every 5 (five) minutes as needed for chest pain. 03/06/21  [provider]  Nutritional Supplement LIQD Take 1 each by mouth daily. Health Shake    [provider]  OXYGEN Inhale 2 L into the lungs continuous.    [provider]  pantoprazole  (PROTONIX) 40 MG tablet Take 40 mg by mouth 2 (two) times daily.    [provider]  polyethylene glycol (MIRALAX / GLYCOLAX) 17 g packet Take 17 g by mouth daily.    [provider]  saccharomyces boulardii (FLORASTOR) 250 MG capsule Take 250 mg by mouth 2 (two) times daily.    [provider]  senna (SENOKOT) 8.6 MG TABS tablet Take 2 tablets by mouth 2 (two) times daily.    [provider]  sertraline (ZOLOFT) 50 MG tablet Take 50 mg by mouth daily.     [provider]     Critical care time: 48    Lenard Galloway [facility] 03/26/23 12:43 PM  Please see [email or web address] for pager details.  From 7A-7P if no response, please call [phone] After hours, please call ELink [phone]

## 2023-03-26 NOTE — Progress Notes (Signed)
RT transported pt from ED Trauma A to 2M09 without any complications. RN at bedside.

## 2023-03-27 ENCOUNTER — Inpatient Hospital Stay (HOSPITAL_COMMUNITY): Payer: Medicare (Managed Care)

## 2023-03-27 DIAGNOSIS — J189 Pneumonia, unspecified organism: Secondary | ICD-10-CM

## 2023-03-27 DIAGNOSIS — R609 Edema, unspecified: Secondary | ICD-10-CM

## 2023-03-27 DIAGNOSIS — J9601 Acute respiratory failure with hypoxia: Secondary | ICD-10-CM | POA: Diagnosis not present

## 2023-03-27 DIAGNOSIS — I469 Cardiac arrest, cause unspecified: Secondary | ICD-10-CM | POA: Diagnosis not present

## 2023-03-27 LAB — GLUCOSE, CAPILLARY
Glucose-Capillary: 84 mg/dL (ref 70–99)
Glucose-Capillary: 108 mg/dL — ABNORMAL HIGH (ref 70–99)
Glucose-Capillary: 126 mg/dL — ABNORMAL HIGH (ref 70–99)
Glucose-Capillary: 128 mg/dL — ABNORMAL HIGH (ref 70–99)
Glucose-Capillary: 151 mg/dL — ABNORMAL HIGH (ref 70–99)
Glucose-Capillary: 108 mg/dL — ABNORMAL HIGH (ref 70–99)

## 2023-03-27 LAB — MAGNESIUM
Magnesium: 3.2 mg/dL — ABNORMAL HIGH (ref 1.7–2.4)
Magnesium: 2.9 mg/dL — ABNORMAL HIGH (ref 1.7–2.4)

## 2023-03-27 LAB — HEPATIC FUNCTION PANEL
ALT: 555 U/L — ABNORMAL HIGH (ref 0–44)
AST: 540 U/L — ABNORMAL HIGH (ref 15–41)
Albumin: 3 g/dL — ABNORMAL LOW (ref 3.5–5.0)
Alkaline Phosphatase: 138 U/L — ABNORMAL HIGH (ref 38–126)
Bilirubin, Direct: <0.1 mg/dL (ref 0.0–0.2)
Total Bilirubin: 0.7 mg/dL (ref <1.2)
Total Protein: 6.1 g/dL — ABNORMAL LOW (ref 6.5–8.1)

## 2023-03-27 LAB — RENAL FUNCTION PANEL
Albumin: 2.6 g/dL — ABNORMAL LOW (ref 3.5–5.0)
Albumin: 3 g/dL — ABNORMAL LOW (ref 3.5–5.0)
Anion gap: 9 (ref 5–15)
Anion gap: 9 (ref 5–15)
BUN: 19 mg/dL (ref 8–23)
BUN: 23 mg/dL (ref 8–23)
CO2: 28 mmol/L (ref 22–32)
CO2: 30 mmol/L (ref 22–32)
Calcium: 8.2 mg/dL — ABNORMAL LOW (ref 8.9–10.3)
Calcium: 9.1 mg/dL (ref 8.9–10.3)
Chloride: 101 mmol/L (ref 98–111)
Chloride: 104 mmol/L (ref 98–111)
Creatinine, Ser: 0.92 mg/dL (ref 0.44–1.00)
Creatinine, Ser: 1.01 mg/dL — ABNORMAL HIGH (ref 0.44–1.00)
GFR, Estimated: 57 mL/min — ABNORMAL LOW (ref >60)
GFR, Estimated: >60 mL/min (ref >60)
Glucose, Bld: 112 mg/dL — ABNORMAL HIGH (ref 70–99)
Glucose, Bld: 117 mg/dL — ABNORMAL HIGH (ref 70–99)
Phosphorus: 6 mg/dL — ABNORMAL HIGH (ref 2.5–4.6)
Phosphorus: <1 mg/dL — CL (ref 2.5–4.6)
Potassium: 3 mmol/L — ABNORMAL LOW (ref 3.5–5.1)
Potassium: 3.8 mmol/L (ref 3.5–5.1)
Sodium: 138 mmol/L (ref 135–145)
Sodium: 143 mmol/L (ref 135–145)

## 2023-03-27 LAB — BASIC METABOLIC PANEL
Anion gap: 10 (ref 5–15)
BUN: 18 mg/dL (ref 8–23)
CO2: 30 mmol/L (ref 22–32)
Calcium: 8.8 mg/dL — ABNORMAL LOW (ref 8.9–10.3)
Chloride: 104 mmol/L (ref 98–111)
Creatinine, Ser: 1.08 mg/dL — ABNORMAL HIGH (ref 0.44–1.00)
GFR, Estimated: 52 mL/min — ABNORMAL LOW (ref >60)
Glucose, Bld: 153 mg/dL — ABNORMAL HIGH (ref 70–99)
Potassium: 3.6 mmol/L (ref 3.5–5.1)
Sodium: 144 mmol/L (ref 135–145)

## 2023-03-27 LAB — CBC
HCT: 29.7 % — ABNORMAL LOW (ref 36.0–46.0)
Hemoglobin: 8.6 g/dL — ABNORMAL LOW (ref 12.0–15.0)
MCH: 24.5 pg — ABNORMAL LOW (ref 26.0–34.0)
MCHC: 29 g/dL — ABNORMAL LOW (ref 30.0–36.0)
MCV: 84.6 fL (ref 80.0–100.0)
Platelets: 178 10*3/uL (ref 150–400)
RBC: 3.51 MIL/uL — ABNORMAL LOW (ref 3.87–5.11)
RDW: 14 % (ref 11.5–15.5)
WBC: 15 10*3/uL — ABNORMAL HIGH (ref 4.0–10.5)
nRBC: 0 % (ref 0.0–0.2)

## 2023-03-27 LAB — STREP PNEUMONIAE URINARY ANTIGEN: Strep Pneumo Urinary Antigen: NEGATIVE

## 2023-03-27 LAB — TRIGLYCERIDES: Triglycerides: 97 mg/dL (ref <150)

## 2023-03-27 LAB — PHOSPHORUS: Phosphorus: <1 mg/dL — CL (ref 2.5–4.6)

## 2023-03-27 LAB — HEPARIN LEVEL (UNFRACTIONATED): Heparin Unfractionated: 0.55 [IU]/mL (ref 0.30–0.70)

## 2023-03-27 MED ORDER — ASPIRIN 81 MG PO CHEW
81.0000 mg | CHEWABLE_TABLET | Freq: Once | ORAL | Status: AC
  Administered 2023-03-27: 81 mg via NASOGASTRIC
  Filled 2023-03-27: qty 1

## 2023-03-27 MED ORDER — DOCUSATE SODIUM 50 MG/5ML PO LIQD
100.0000 mg | Freq: Two times a day (BID) | ORAL | Status: DC
Start: 2023-03-27 — End: 2023-03-28
  Administered 2023-03-27 (×2): 100 mg
  Filled 2023-03-27 (×2): qty 10

## 2023-03-27 MED ORDER — POTASSIUM PHOSPHATES 15 MMOLE/5ML IV SOLN
45.0000 mmol | Freq: Once | INTRAVENOUS | Status: AC
  Administered 2023-03-27: 45 mmol via INTRAVENOUS
  Filled 2023-03-27: qty 15

## 2023-03-27 MED ORDER — POTASSIUM PHOSPHATES 15 MMOLE/5ML IV SOLN
30.0000 mmol | Freq: Once | INTRAVENOUS | Status: AC
  Administered 2023-03-27: 30 mmol via INTRAVENOUS
  Filled 2023-03-27: qty 10

## 2023-03-27 MED ORDER — DEXMEDETOMIDINE HCL IN NACL 400 MCG/100ML IV SOLN
0.0000 ug/kg/h | INTRAVENOUS | Status: DC
  Administered 2023-03-27: 0.4 ug/kg/h via INTRAVENOUS
  Filled 2023-03-27: qty 100

## 2023-03-27 MED ORDER — PROSOURCE TF20 ENFIT COMPATIBL EN LIQD
60.0000 mL | Freq: Every day | ENTERAL | Status: DC
  Administered 2023-03-27: 60 mL
  Filled 2023-03-27: qty 60

## 2023-03-27 MED ORDER — POTASSIUM CHLORIDE 10 MEQ/100ML IV SOLN
10.0000 meq | INTRAVENOUS | Status: AC
  Administered 2023-03-27 (×2): 10 meq via INTRAVENOUS
  Filled 2023-03-27 (×2): qty 100

## 2023-03-27 MED ORDER — POLYETHYLENE GLYCOL 3350 17 G PO PACK
17.0000 g | PACK | Freq: Every day | ORAL | Status: DC
Start: 2023-03-27 — End: 2023-03-28
  Administered 2023-03-27: 17 g
  Filled 2023-03-27: qty 1

## 2023-03-27 MED ORDER — HEPARIN (PORCINE) 25000 UT/250ML-% IV SOLN
1000.0000 [IU]/h | INTRAVENOUS | Status: DC
  Administered 2023-03-27 – 2023-03-28 (×2): 950 [IU]/h via INTRAVENOUS
  Administered 2023-03-29: 1000 [IU]/h via INTRAVENOUS
  Filled 2023-03-27 (×3): qty 250

## 2023-03-27 MED ORDER — INSULIN ASPART 100 UNIT/ML IJ SOLN
0.0000 [IU] | INTRAMUSCULAR | Status: DC
  Administered 2023-03-27: 2 [IU] via SUBCUTANEOUS
  Administered 2023-03-27 (×2): 1 [IU] via SUBCUTANEOUS
  Administered 2023-03-28: 2 [IU] via SUBCUTANEOUS
  Administered 2023-03-28: 1 [IU] via SUBCUTANEOUS
  Administered 2023-03-29 – 2023-03-30 (×4): 2 [IU] via SUBCUTANEOUS
  Administered 2023-03-30 – 2023-03-31 (×6): 1 [IU] via SUBCUTANEOUS
  Administered 2023-03-31: 2 [IU] via SUBCUTANEOUS
  Administered 2023-03-31: 1 [IU] via SUBCUTANEOUS
  Administered 2023-04-01: 2 [IU] via SUBCUTANEOUS
  Administered 2023-04-01 (×2): 1 [IU] via SUBCUTANEOUS
  Administered 2023-04-02: 2 [IU] via SUBCUTANEOUS
  Administered 2023-04-02: 1 [IU] via SUBCUTANEOUS
  Administered 2023-04-02 (×2): 2 [IU] via SUBCUTANEOUS
  Administered 2023-04-03 (×3): 1 [IU] via SUBCUTANEOUS

## 2023-03-27 MED ORDER — HEPARIN BOLUS VIA INFUSION
4000.0000 [IU] | Freq: Once | INTRAVENOUS | Status: AC
  Administered 2023-03-27: 4000 [IU] via INTRAVENOUS
  Filled 2023-03-27: qty 4000

## 2023-03-27 MED ORDER — THIAMINE MONONITRATE 100 MG PO TABS
100.0000 mg | ORAL_TABLET | Freq: Every day | ORAL | Status: DC
  Administered 2023-03-27: 100 mg
  Filled 2023-03-27: qty 1

## 2023-03-27 MED ORDER — ATROPINE SULFATE 1 MG/10ML IJ SOSY
PREFILLED_SYRINGE | INTRAMUSCULAR | Status: AC
  Filled 2023-03-27: qty 10

## 2023-03-27 MED ORDER — MIDAZOLAM-SODIUM CHLORIDE 100-0.9 MG/100ML-% IV SOLN
0.0000 mg/h | INTRAVENOUS | Status: DC
  Administered 2023-03-27: 2 mg/h via INTRAVENOUS
  Filled 2023-03-27: qty 100

## 2023-03-27 MED ORDER — SODIUM CHLORIDE 0.9 % IV SOLN
2.0000 g | INTRAVENOUS | Status: AC
  Administered 2023-03-27 – 2023-03-31 (×5): 2 g via INTRAVENOUS
  Filled 2023-03-27 (×5): qty 20

## 2023-03-27 MED ORDER — MIDAZOLAM BOLUS VIA INFUSION
0.0000 mg | INTRAVENOUS | Status: DC | PRN
  Administered 2023-03-27: 2 mg via INTRAVENOUS
  Administered 2023-03-27 (×2): 1 mg via INTRAVENOUS

## 2023-03-27 MED ORDER — INSULIN ASPART 100 UNIT/ML IJ SOLN: 0.0000 [IU] | Freq: Three times a day (TID) | INTRAMUSCULAR | Status: DC

## 2023-03-27 MED ORDER — OSMOLITE 1.2 CAL PO LIQD
1000.0000 mL | ORAL | Status: DC
  Administered 2023-03-27: 1000 mL
  Filled 2023-03-27 (×2): qty 1000

## 2023-03-27 NOTE — Progress Notes (Signed)
PHARMACY - ANTICOAGULATION CONSULT NOTE  Pharmacy Consult for heparin gtt Indication: chest pain/ACS  Allergies  Allergen Reactions   Egg-Derived Products Swelling    Listed on [facility] 07/04/21 Patient reports had face swelling.    Penicillins Other (See Comments)    Patient Measurements: Height: 5\' 3"  (160 cm) Weight: 87.2 kg (192 lb 3.9 oz) IBW/kg (Calculated) : 52.4 Heparin Dosing Weight: 79.9 kg   Vital Signs: Temp: 100.4 F (38 C) (11/26 2330) Temp Source: Bladder (11/26 2000) BP: 114/74 (11/26 2330) Pulse Rate: 76 (11/26 2330)  Labs: Recent Labs    03/26/23 1135 03/26/23 1136 03/26/23 1426 03/26/23 1438 03/26/23 1829 03/27/23 0043 03/27/23 1128 03/27/23 2310  HGB 9.9*   < > 9.1* 10.2*  --  8.6*  --   --   HCT 36.2   < > 31.4* 30.0*  --  29.7*  --   --   PLT 262  --  182  --   --  178  --   --   HEPARINUNFRC  --   --   --   --   --   --   --  0.55  CREATININE  --    < > 1.05*  --   --  0.92 1.08* 1.01*  TROPONINIHS 33*  --  113*  --  281*  --   --   --    < > = values in this interval not displayed.    Estimated Creatinine Clearance: 47.3 mL/min (A) (by C-G formula based on SCr of 1.01 mg/dL (H)).   Medical History: Past Medical History:  Diagnosis Date   Anemia    Arthritis    Asthma    CAD (coronary artery disease)    COPD (chronic obstructive pulmonary disease) (HCC)    Diabetes mellitus    Hyperlipidemia    Hypertension    Myocardial infarct (HCC)    Postmenopausal atrophic vaginitis    Restless leg syndrome     Assessment: 77 yo F with suspected ACS event leading to cardiac arrest. Pharmacy consulted for heparin gtt.   Hgb 8.6, Plt 178  Trop 113>281  11/26 PM: heparin level returned at 0.55 on 950 units/hr (therapeutic).  Per RN, no issues with the heparin infusion running continuously or signs/symptoms of bleeding.   Goal of Therapy:  Heparin level 0.3-0.7 units/ml Monitor platelets by anticoagulation protocol: Yes   Plan:  Continue  heparin infusion at 950 units/hr 8hr confirmatory heparin level  Daily heparin level, CBC F/u s/sx bleeding and cardiology plans   Arabella Merles, PharmD. Clinical Pharmacist 03/27/2023 11:47 PM

## 2023-03-27 NOTE — Plan of Care (Signed)
Problem: Coping: Goal: Ability to adjust to condition or change in health will improve Outcome: Progressing   Problem: Nutritional: Goal: Maintenance of adequate nutrition will improve Outcome: Progressing

## 2023-03-27 NOTE — Progress Notes (Addendum)
PCCM: Resident Note   NAME:  Courtney [name], MRN:  [id number], DOB:  1945-09-23, LOS: 1 ADMISSION DATE:  03/26/2023, CONSULTATION DATE:  03/26/2023 REFERRING MD:  Dalene Seltzer, EDP, CHIEF COMPLAINT:  Cardiac arrest    History of Present Illness:  77 year old female with past medical history of asthma, COPD, diabetes, hyperlipidemia, hypertension, MI, CHF who presents to ED on 03/26/23 with cardiac arrest from facility. Per facility/EMS report, patient fell earlier today. She went back to her room and staff heard her talking. After she was quiet, they checked on her about 5 minutes later finding her apneic and pulseless. Per EDP, when fire department arrival which was about 5 minutes from call to 911, patient had ROSC/pulse. There was reported seizure-like activity at that time. Reported GCS was 3 at that time.    In the ED, GCS low, she was intubated for airway protection. Started on low dose propofol, peripheral levophed for hypotension after 1L IVF resuscitation with systolic in the 80s. Admitted to the ICU and was transitioned from propofol to versed on 11/25-11/26 for QT prolongation.    Pertinent  Medical History  asthma, COPD, diabetes, hyperlipidemia, hypertension, MI, CHF  Significant Hospital Events: Including procedures, antibiotic start and stop dates in addition to other pertinent events   11/25: admit to PCCM for cardiac arrest  11/25: Levophed discontinued 11/26: EKG with new first-degree heart block with QTc prolongation greater than 500, PVCs, discontinued Flagyl, discontinued propofol, started Versed 11/26: Discontinued Versed, started Precedex  Interim History / Subjective:  Transitioned from versed to Precedex   Review of Systems:   Unable to participate due to intubation and cognition  Objective   Blood pressure (!) 94/59, pulse (!) 53, temperature 98.4 F (36.9 C), resp. rate 16, height 5\' 3"  (1.6 m), weight 87.2 kg, SpO2 100%.    Vent Mode: PRVC FiO2 (%):  [40 %-100  %] 40 % Set Rate:  [16 bmp-20 bmp] 16 bmp Vt Set:  [420 mL-520 mL] 420 mL PEEP:  [5 cmH20] 5 cmH20 Plateau Pressure:  [22 cmH20-29 cmH20] 22 cmH20   Intake/Output Summary (Last 24 hours) at 03/27/2023 1044 Last data filed at 03/27/2023 1000 Gross per 24 hour  Intake 3005.42 ml  Output 720 ml  Net 2285.42 ml   Filed Weights   03/26/23 1158 03/27/23 0500  Weight: 113.4 kg 87.2 kg    Examination: General: NAD, laying in bed HEENT: Right pupil 1mm, left pupil 3mm, ETT in place  Lungs: Clear to auscultate bilaterally Cardiovascular: Regular rate and rhythm, occasional PVCs on telemetry Abdomen: Soft, bowel sounds present Extremities: Cool extremities, RLE withdraws to pain, will not move spontaneously  Neuro: Not awake or alert this morning, withdrawals from painful stimuli (sternal rub)  Upon reevaluation: Patient was moving all four extremities, pupils are equal in size, and she was more alert  Labs   CBC:    Latest Ref Rng & Units 03/27/2023   12:43 AM 03/26/2023    2:38 PM 03/26/2023    2:26 PM  CBC  WBC 4.0 - 10.5 K/uL 15.0   15.1   Hemoglobin 12.0 - 15.0 g/dL 8.6  23.7  9.1   Hematocrit 36.0 - 46.0 % 29.7  30.0  31.4   Platelets 150 - 400 K/uL 178   182      Basic Metabolic Panel:    Latest Ref Rng & Units 03/27/2023   12:43 AM 03/26/2023    2:38 PM 03/26/2023    2:26 PM  BMP  Glucose  70 - 99 mg/dL 478     BUN 8 - 23 mg/dL 19     Creatinine 2.95 - 1.00 mg/dL 6.21   3.08   Sodium 657 - 145 mmol/L 143  144    Potassium 3.5 - 5.1 mmol/L 3.0  3.7    Chloride 98 - 111 mmol/L 104     CO2 22 - 32 mmol/L 30     Calcium 8.9 - 10.3 mg/dL 9.1       GFR: Estimated Creatinine Clearance: 51.9 mL/min (by C-G formula based on SCr of 0.92 mg/dL). Recent Labs  Lab 03/26/23 1135 03/26/23 1144 03/26/23 1426 03/26/23 1438 03/27/23 0043  PROCALCITON  --   --  0.49  --   --   WBC 13.5*  --  15.1*  --  15.0*  LATICACIDVEN  --  6.8*  --  2.7*  --     Liver Function  Tests: Recent Labs  Lab 03/26/23 1136 03/27/23 0043  AST 704* 540*  ALT 729* 555*  ALKPHOS 167* 138*  BILITOT 0.7 0.7  PROT 6.9 6.1*  ALBUMIN 3.3* 3.0*  3.0*    ABG    Component Value Date/Time   PHART 7.481 (H) 03/26/2023 1348   PCO2ART 43.2 03/26/2023 1348   PO2ART 181 (H) 03/26/2023 1348   HCO3 33.2 (H) 03/26/2023 1438   TCO2 35 (H) 03/26/2023 1438   O2SAT 77 03/26/2023 1438    HbA1C: Hgb A1c MFr Bld  Date/Time Value Ref Range Status  03/26/2023 11:35 AM 6.1 (H) 4.8 - 5.6 % Final    Comment:    (NOTE) Pre diabetes:          5.7%-6.4%  Diabetes:              >6.4%  Glycemic control for   <7.0% adults with diabetes   07/04/2021 05:48 PM 6.6 (H) 4.8 - 5.6 % Final    Comment:    (NOTE) Pre diabetes:          5.7%-6.4%  Diabetes:              >6.4%  Glycemic control for   <7.0% adults with diabetes     CBG: Recent Labs  Lab 03/26/23 2324 03/27/23 0334 03/27/23 0738  GLUCAP 124* 84 108*   Nasal MRSA + Respiratory Viral Panel: Not detected   EEG: No seizures or epileptiform discharges seen, study suggestive of moderate to severe diffuse encephalopathy  Echocardiogram: LVEF 50%, left ventricle demonstrates regional wall motion abnormalities with severe hypokinesis of the apical septal wall and apex, grade 1 diastolic dysfunction -Right ventricular systolic function mildly reduced  CXR: Continued retrocardiac consolidation consistent with atelectasis or dense airspace disease, marked improvement in aeration of the right upper lobe   LV duplex: Ordered Resolved Hospital Problem list     Assessment & Plan:  Cardiac arrest possibly secondary to QT prolongation Postarrest encephalopathy  Patient was transitioned to Precedex for agitation and weaned off levophed. Her MAP is maintained over 65 without Levophed. She is still agitated but her encephalopathy improved after d/c of versed. Encephalopathy is most likely from the sedating medications at this  moment.  -Limit QT prolonging medications -Maintain potassium greater than 4, magnesium greater than 2 -Repeat Mag, BMP at noon  -EKG shows PVCs, Qtc of 524  -Will continue to keep MAP> 65  -Try to wean off Precedx for SBT this afternoon -If patient is unable to be extubated this afternoon, will begin tube feeds   New  Arrhythmia: Patient's telemetry picked up a new arrhythmia this morning. It appears to be a Mobitz Type I ; however the P-R prolongation is very minimal/difficult to fully assess. Will consult cardiology for both cardiac arrest evaluation as well as new arrhythmia evaluation.   Respiratory failure with aspiration Will transition patient to ceftriaxone today, discontinue cefepime and vancomycin  Do not need to add Flagyl for anaerobic coverage per IDSA Tracheal aspiration today to help guide ABX  Possible extubation today, will trial SBT once more alert  Shock liver Improving, AST and ALT have down trended from 704/729 to 540/555 -CMP tomorrow -D/c statin   Type 2 diabetes mellitus A1c 6.1, decreased SSI to sensitive coverage  Hypertension Continue to hold Home HTN medications   HFpEF CAD Hyperlipidemia D/c statin due to elevated AST/ALT at this time Continue to hold GDMT, consider restarting during hospitalization   Best Practice (right click and "Reselect all SmartList Selections" daily)   Diet/type: NPO DVT prophylaxis: Heparin 5,000 units subcutaneous TID GI prophylaxis: PPI Lines: Left subclavian  Foley:  Indwelling foley catheter  Code Status:  full code

## 2023-03-27 NOTE — Progress Notes (Signed)
Patient became restless and trying to grab ETT tube. Started coughing on vent, rhythm change noticed on EEG, heart rate dropped to 42. Atropine at bedside, ELINK notifed, EKG obtained. Per MD draw morning labs early.

## 2023-03-27 NOTE — Consult Note (Addendum)
Cardiology Consultation   Patient ID: SARHA SASSO MRN: [id number]; DOB: Jan 14, 1946  Admit date: 03/26/2023 Date of Consult: 03/27/2023  PCP:  Sherron Monday, MD   [facility] Providers Cardiologist:  Lance Muss, MD   {  Patient Profile:   PANG GODINHO is a 77 y.o. female with a hx of ED status post DES to RCA in 2011, chronic HFpEF, PVCs, COPD, diabetes, hypertension, dementia hyperlipidemia who is being seen 03/27/2023 for the evaluation of cardiac arrest/first-degree AV block at the request of Dr. Francine Graven.  History of Present Illness:   Ms. Vivenzio was initially followed by Dr Eldridge Dace in 2018 for her history of CAD status post DES to RCA in 2011 in the setting of a STEMI.  Since 2018 she has not been seen outpatient.  However, she did have a Lexiscan in November 2022 that was negative and did not note any perfusion defects.  She was last seen by our group in March 2023 due to acute on chronic HFpEF exacerbation.  She was diuresed with IV Lasix.  She did not follow-up with [place].  She was also recently seen on 03/17/2023 for atypical chest pain.  EKG was reported to show no acute changes and troponins were minimally elevated and flat so she was recommended to follow-up with her PCP.  Currently patient is being evaluated for a cardiac arrest that she sustained at her facility.  It was reported that she had fallen previously that day, went back into her room and staff heard her talking however she became quiet.  5 minutes went by and they found her pulseless and apneic, nursing staff started CPR and had gained ROSC after 5 minutes with no shocks administered or epinephrine.  By the time EMS arrived she had a pulse.  Rhythm was reported to be sinus with PVCs.  While performing their assessment EMS reported seizure-like activity, no Versed was administered given short duration.  On arrival here, EKG showed sinus tachycardia heart rate 107.  PVC and prolonged QTc of 588.   QTc medications have been discontinued.  Propofol was discontinued and she was started on Versed and then discontinued again and started on Precedex.  Flagyl also discontinued.  Magnesium and phosphorus were critically low.  1.1 magnesium, phosphorus undetectable.  Magnesium has been repleted and now 2.9.  QTc now under 500. She also now has new second-degree type I AV block.  She remains intubated and hypotensive however not on vasopressors longer.  Additionally, echocardiogram shows preserved EF with regional wall motion abnormalities in the apical and septal wall.  Lactic acid on arrival 6.8 now 2.7.  BNP 202.  Potassium 3.6.  Magnesium 2.9.  Creatinine 1.08.  She has shock liver with elevated LFTs 500+ but trending down.  WBC 15.  Hemoglobin 8.6.  Troponins 33-113-281.  Chest x-ray shows widespread interstitial groundglass airspace disease and atelectasis.  EEG showing no seizure-like activity CT head showed no acute fractures.  There was a mildly enlarged thyroid gland.    Past Medical History:  Diagnosis Date   Anemia    Arthritis    Asthma    CAD (coronary artery disease)    COPD (chronic obstructive pulmonary disease) (HCC)    Diabetes mellitus    Hyperlipidemia    Hypertension    Myocardial infarct (HCC)    Postmenopausal atrophic vaginitis    Restless leg syndrome     Past Surgical History:  Procedure Laterality Date   BIOPSY  02/09/2021  Procedure: BIOPSY;  Surgeon: Imogene Burn, MD;  Location: [facility] ENDOSCOPY;  Service: Gastroenterology;;   CHOLECYSTECTOMY  [phone]   ESOPHAGOGASTRODUODENOSCOPY (EGD) WITH PROPOFOL N/A 02/09/2021   Procedure: ESOPHAGOGASTRODUODENOSCOPY (EGD) WITH PROPOFOL;  Surgeon: Imogene Burn, MD;  Location: [facility] ENDOSCOPY;  Service: Gastroenterology;  Laterality: N/A;   INCISION AND DRAINAGE ABSCESS Right 06/27/2018   Procedure: 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.;  Surgeon: Romie Levee, MD;  Location: [facility] ORS;  Service: General;   Laterality: Right;   INCISION AND DRAINAGE PERIRECTAL ABSCESS N/A 06/25/2018   Procedure: IRRIGATION AND DEBRIDEMENT PERIRECTAL ABCESS;  Surgeon: Romie Levee, MD;  Location: [facility] ORS;  Service: General;  Laterality: N/A;   IRRIGATION AND DEBRIDEMENT ABSCESS N/A 07/01/2018   Procedure: IRRIGATION AND DEBRIDEMENT ABSCESS;  Surgeon: Emelia Loron, MD;  Location: [facility] ORS;  Service: General;  Laterality: N/A;   OTHER SURGICAL HISTORY  1970's   hysterectomy   WOUND DEBRIDEMENT N/A 07/03/2018   Procedure: DEBRIDEMENT PELVIS WITH DRESSING CHANGE;  Surgeon: Emelia Loron, MD;  Location: [facility] ORS;  Service: General;  Laterality: N/A;    Inpatient Medications: Scheduled Meds:  acetaminophen  650 mg Oral Q4H   Or   acetaminophen (TYLENOL) oral liquid 160 mg/5 mL  650 mg Per Tube Q4H   Or   acetaminophen  650 mg Rectal Q4H   arformoterol  15 mcg Nebulization BID   aspirin  81 mg Per NG tube Once   budesonide (PULMICORT) nebulizer solution  0.25 mg Nebulization BID   Chlorhexidine Gluconate Cloth  6 each Topical Daily   docusate  100 mg Per Tube BID   famotidine  20 mg Per Tube BID   feeding supplement (PROSource TF20)  60 mL Per Tube Daily   insulin aspart  0-9 Units Subcutaneous Q4H   mupirocin ointment  1 Application Nasal BID   mouth rinse  15 mL Mouth Rinse Q2H   pantoprazole (PROTONIX) IV  40 mg Intravenous QHS   polyethylene glycol  17 g Per Tube Daily   revefenacin  175 mcg Nebulization Daily   thiamine  100 mg Per Tube Daily   Continuous Infusions:  cefTRIAXone (ROCEPHIN)  IV Stopped (03/27/23 1026)   dexmedetomidine (PRECEDEX) IV infusion 0.4 mcg/kg/hr (03/27/23 1200)   feeding supplement (OSMOLITE 1.2 CAL)     potassium PHOSPHATE IVPB (in mmol)     PRN Meds: docusate, ipratropium-albuterol, mouth rinse, polyethylene glycol  Allergies:    Allergies  Allergen Reactions   Egg-Derived Products Swelling    Listed on [facility] 07/04/21 Patient reports had face swelling.     Penicillins Other (See Comments)    Social History:   Social History   Socioeconomic History   Marital status: Married    Spouse name: Not on file   Number of children: Not on file   Years of education: Not on file   Highest education level: Not on file  Occupational History   Not on file  Tobacco Use   Smoking status: Former    Current packs/day: 0.00    Types: Cigarettes    Quit date: 05/01/2018    Years since quitting: 4.9    Passive exposure: Never   Smokeless tobacco: Never  Substance and Sexual Activity   Alcohol use: No   Drug use: No   Sexual activity: Not on file  Other Topics Concern   Not on file  Social History Narrative   Not on file   Social Determinants of Health   Financial Resource Strain: Not  on file  Food Insecurity: Not on file  Transportation Needs: Not on file  Physical Activity: Not on file  Stress: Not on file  Social Connections: Not on file  Intimate Partner Violence: Not on file    Family History:   Family History  Problem Relation Age of Onset   Hypertension Brother    Heart attack Neg Hx      ROS:  Please see the history of present illness.  All other ROS reviewed and negative.     Physical Exam/Data:   Vitals:   03/27/23 1245 03/27/23 1300 03/27/23 1315 03/27/23 1330  BP: 93/63 (!) 97/54 (!) 90/52 (!) 105/56  Pulse: (!) 53 (!) 57 (!) 52 (!) 52  Resp: 16 15 15 16   Temp: 98.1 F (36.7 C) 98.1 F (36.7 C) 97.9 F (36.6 C) 97.9 F (36.6 C)  TempSrc:      SpO2: 100% 100% 100% 100%  Weight:      Height:        Intake/Output Summary (Last 24 hours) at 03/27/2023 1353 Last data filed at 03/27/2023 1200 Gross per 24 hour  Intake 2166.7 ml  Output 820 ml  Net 1346.7 ml      03/27/2023    5:00 AM 03/26/2023   11:58 AM 07/08/2021    5:00 AM  Last 3 Weights  Weight (lbs) 192 lb 3.9 oz 250 lb 219 lb 5.7 oz  Weight (kg) 87.2 kg 113.399 kg 99.5 kg     Body mass index is 34.05 kg/m.  General: Intubated HEENT:  normal Neck: no JVD Vascular: No carotid bruits; Distal pulses 2+ bilaterally Cardiac: Irregular, no murmurs  lungs:  clear to auscultation bilaterally anteriorly Abd: soft, nontender, no hepatomegaly  Ext: no edema Musculoskeletal:  No deformities, BUE and BLE strength normal and equal Skin: warm and dry  Neuro:  CNs 2-12 intact, no focal abnormalities noted Psych:  Normal affect   EKG:  The EKG was personally reviewed and demonstrates: Sinus bradycardia, heart rate 49.  Second-degree type I AV block.  T wave inversions anteriorly Telemetry:  Telemetry was personally reviewed and demonstrates: Second-degree type I AV block  Relevant CV Studies: Echocardiogram 03/26/2023 1. Left ventricular ejection fraction, by estimation, is 50%. The left  ventricle has mildly decreased function. The left ventricle demonstrates  regional wall motion abnormalities with severe hypokinesis of the apical  septal wall and true apex. There is  mild concentric left ventricular hypertrophy. Left ventricular diastolic  parameters are consistent with Grade I diastolic dysfunction (impaired  relaxation).   2. Right ventricular systolic function is mildly reduced. The right  ventricular size is normal. Tricuspid regurgitation signal is inadequate  for assessing PA pressure.   3. Left atrial size was mildly dilated.   4. Right atrial size was mildly dilated.   5. The mitral valve is normal in structure. No evidence of mitral valve  regurgitation. No evidence of mitral stenosis.   6. The aortic valve is tricuspid. There is mild calcification of the  aortic valve. Aortic valve regurgitation is not visualized. No aortic  stenosis is present.   7. The inferior vena cava is normal in size with greater than 50%  respiratory variability, suggesting right atrial pressure of 3 mmHg.    Laboratory Data:  High Sensitivity Troponin:   Recent Labs  Lab 03/17/23 1713 03/17/23 2005 03/26/23 1135 03/26/23 1426  03/26/23 1829  TROPONINIHS 18* 20* 33* 113* 281*     Chemistry Recent Labs  Lab 03/26/23  1136 03/26/23 1144 03/26/23 1348 03/26/23 1426 03/26/23 1438 03/27/23 0043 03/27/23 1128  NA 144 144   < >  --  144 143 144  K 4.2 4.3   < >  --  3.7 3.0* 3.6  CL 101 101  --   --   --  104 104  CO2 28  --   --   --   --  30 30  GLUCOSE 188* 188*  --   --   --  117* 153*  BUN 17 22  --   --   --  19 18  CREATININE 1.08* 1.10*  --  1.05*  --  0.92 1.08*  CALCIUM 9.4  --   --   --   --  9.1 8.8*  MG  --   --   --  1.1*  --  3.2* 2.9*  GFRNONAA 52*  --   --  54*  --  >60 52*  ANIONGAP 15  --   --   --   --  9 10   < > = values in this interval not displayed.    Recent Labs  Lab 03/26/23 1136 03/27/23 0043  PROT 6.9 6.1*  ALBUMIN 3.3* 3.0*  3.0*  AST 704* 540*  ALT 729* 555*  ALKPHOS 167* 138*  BILITOT 0.7 0.7   Lipids  Recent Labs  Lab 03/27/23 0043  TRIG 97    Hematology Recent Labs  Lab 03/26/23 1135 03/26/23 1144 03/26/23 1426 03/26/23 1438 03/27/23 0043  WBC 13.5*  --  15.1*  --  15.0*  RBC 4.02  --  3.59*  --  3.51*  HGB 9.9*   < > 9.1* 10.2* 8.6*  HCT 36.2   < > 31.4* 30.0* 29.7*  MCV 90.0  --  87.5  --  84.6  MCH 24.6*  --  25.3*  --  24.5*  MCHC 27.3*  --  29.0*  --  29.0*  RDW 13.7  --  13.7  --  14.0  PLT 262  --  182  --  178   < > = values in this interval not displayed.   Thyroid No results for input(s): "TSH", "FREET4" in the last 168 hours.  BNP Recent Labs  Lab 03/26/23 1426  BNP 202.9*    DDimer No results for input(s): "DDIMER" in the last 168 hours.   Radiology/Studies:  DG CHEST PORT 1 VIEW  Result Date: 03/27/2023 CLINICAL DATA:  Pneumonia, intubated EXAM: PORTABLE CHEST 1 VIEW COMPARISON:  03/26/2023 FINDINGS: Single frontal view of the chest demonstrates endotracheal tube overlying tracheal air column, tip 1.2 cm above carina. Enteric catheter passes below diaphragm tip excluded by collimation. Stable left subclavian central venous  catheter tip overlying superior vena cava. The cardiac silhouette remains enlarged. Marked improved aeration of the right upper lobe since prior study. There are widespread interstitial and ground-glass opacities throughout the lungs, otherwise unchanged. Continued retrocardiac consolidation consistent with atelectasis or dense airspace disease. No effusion or pneumothorax. No acute bony abnormalities. IMPRESSION: 1. Persistent widespread interstitial and ground-glass airspace disease. 2. Persistent retrocardiac consolidation favoring atelectasis. 3. Marked improvement in aeration of the right upper lobe since prior study. Electronically Signed   By: Sharlet Salina M.D.   On: 03/27/2023 09:16   EEG adult  Result Date: 03/26/2023 Charlsie Quest, MD     03/26/2023  7:51 PM Patient Name: KAMDYN RUDDY MRN: [id number] Epilepsy Attending: Charlsie Quest Referring Physician/Provider: Cristopher Peru, PA-C  Date: 03/26/2023 Duration: 22.22 mins Patient history: 77yo F s/p cardiac arrest getting eeg to evaluate for seizure Level of alertness:  comatose AEDs during EEG study: Propofol, Versed Technical aspects: This EEG study was done with scalp electrodes positioned according to the 10-20 International system of electrode placement. Electrical activity was reviewed with band pass filter of 1-70Hz , sensitivity of 7 uV/mm, display speed of 58mm/sec with a 60Hz  notched filter applied as appropriate. EEG data were recorded continuously and digitally stored.  Video monitoring was available and reviewed as appropriate. Description: EEG showed continuous generalized 3 to 6 Hz theta-delta slowing. Patient was noted to have chewing movements at times during the study. Concomitant EEG before, during and after the event did not show any EEG changes suggest seizure. Hyperventilation and photic stimulation were not performed.   ABNORMALITY - Continuous slow, generalized IMPRESSION: This study is suggestive of moderate to  severe diffuse encephalopathy. No seizures or epileptiform discharges were seen throughout the recording. Patient was noted to have chewing movements at times during the study without concomitant EEG change. These events were NOT epileptic. Priyanka Annabelle Harman   DG CHEST PORT 1 VIEW  Result Date: 03/26/2023 CLINICAL DATA:  Endotracheally intubated, central line placement EXAM: PORTABLE CHEST 1 VIEW COMPARISON:  03/26/2023 FINDINGS: Left central line tip in the SVC. No pneumothorax. Endotracheal tube slightly retracted since prior study, now 2.8 cm above the carina. NG tube is in the stomach. Heart borderline in size. Left lower lobe and right upper lobe airspace opacities more confluent than prior study. Mild vascular congestion and diffuse interstitial prominence. No visible effusions or pneumothorax. IMPRESSION: Left central line tip in the SVC.  No pneumothorax. Left lower lobe and right upper lobe airspace disease could reflect atelectasis/collapse or pneumonia. Vascular congestion and interstitial prominence which could reflect interstitial edema. Electronically Signed   By: Charlett Nose M.D.   On: 03/26/2023 17:27   CT CHEST ABDOMEN PELVIS WO CONTRAST  Result Date: 03/26/2023 CLINICAL DATA:  Altered mental status following resuscitation with sepsis EXAM: CT CHEST, ABDOMEN AND PELVIS WITHOUT CONTRAST TECHNIQUE: Multidetector CT imaging of the chest, abdomen and pelvis was performed following the standard protocol without IV contrast. RADIATION DOSE REDUCTION: This exam was performed according to the departmental dose-optimization program which includes automated exposure control, adjustment of the mA and/or kV according to patient size and/or use of iterative reconstruction technique. COMPARISON:  Same day chest radiograph, CT abdomen and pelvis dated 06/25/2018 FINDINGS: CT CHEST FINDINGS Cardiovascular: Normal heart size. No significant pericardial fluid/thickening. Great vessels are normal in course  and caliber. Coronary artery calcifications. Mediastinum/Nodes: Diffusely heterogeneous thyroid gland. Normal esophagus. No pathologically enlarged axillary, supraclavicular, mediastinal, or hilar lymph nodes. Lungs/Pleura: ETT terminates in the right mainstem bronchus. Layering secretions within the trachea. The central airways are patent. Diffuse peribronchial wall thickening throughout. Asymmetric right-greater-than-left interlobular septal thickening. A few scattered tree-in-bud nodules in the bilateral upper and middle lobes. Near-complete atelectasis of bilateral lower lobes. No pneumothorax. Small right and moderate left pleural effusions. Musculoskeletal: No acute or abnormal lytic or blastic osseous lesions. Multilevel degenerative changes of the thoracic spine. CT ABDOMEN PELVIS FINDINGS Hepatobiliary: No focal hepatic lesions. No intra or extrahepatic biliary ductal dilation. Cholecystectomy. Pancreas: No focal lesions or main ductal dilation. Spleen: Normal in size without focal abnormality. Adrenals/Urinary Tract: No adrenal nodules. No suspicious renal mass on this noncontrast enhanced examination , calculi, or hydronephrosis. Bladder is decompressed with catheter in-situ. Stomach/Bowel: Enteric tube terminates in the gastric antrum. Left lower  quadrant colostomy. No evidence of bowel wall thickening, distention, or inflammatory changes. Appendix is not discretely seen. Vascular/Lymphatic: Aortic atherosclerosis. No enlarged abdominal or pelvic lymph nodes. Reproductive: No adnexal masses. Other: Trace free fluid. No fluid collection or free air. Musculoskeletal: No acute or abnormal lytic or blastic osseous findings. Multilevel degenerative changes of the lumbar spine. Postsurgical changes of the anterior abdominal wall. Superior endplate compression of L2. Irregular soft tissue thickening in the right inguinal region and anterior peritoneum, likely postsurgical. IMPRESSION: 1. ETT terminates in the  proximal right main bronchus. Recommend retraction. 2. Asymmetric right-greater-than-left interlobular septal thickening, likely pulmonary edema. 3. Small right and moderate left pleural effusions with near-complete atelectasis of bilateral lower lobes. 4. A few scattered tree-in-bud nodules in the bilateral upper and middle lobes, likely infectious/inflammatory. 5. No acute abdominopelvic findings. 6. Superior endplate compression of L2, age indeterminate. 7. Heterogeneous thyroid gland. In the setting of significant comorbidities or limited life expectancy, no follow-up would be recommended. Otherwise, nonemergent ultrasound examination of the thyroid. (Ref: J Am Coll Radiol. 2015 Feb;12(2): 143-50). 8. Aortic Atherosclerosis (ICD10-I70.0). Coronary artery calcifications. Assessment for potential risk factor modification, dietary therapy or pharmacologic therapy may be warranted, if clinically indicated. Critical Value/emergent results were called by telephone at the time of interpretation on 03/26/2023 at 3:14 pm to provider Dr. Welton Flakes, who verbally acknowledged these results. Electronically Signed   By: Agustin Cree M.D.   On: 03/26/2023 15:17   ECHOCARDIOGRAM COMPLETE  Result Date: 03/26/2023    ECHOCARDIOGRAM REPORT   Patient Name:   JAVAN TARABOCCHIA Date of Exam: 03/26/2023 Medical Rec #:  [id number]       Height:       63.0 in Accession #:    [id number]      Weight:       250.0 lb Date of Birth:  1945-05-21        BSA:          2.126 m Patient Age:    47 years        BP:           162/70 mmHg Patient Gender: F               HR:           96 bpm. Exam Location:  Inpatient Procedure: 2D Echo, Cardiac Doppler, Color Doppler and Strain Analysis Indications:    cardiac arrest  History:        Patient has prior history of Echocardiogram examinations, most                 recent 07/05/2021. Risk Factors:Hypertension, Sleep Apnea,                 Diabetes and Former Smoker.  Sonographer:    Karma Ganja Referring Phys:  Cristopher Peru  Sonographer Comments: Global longitudinal strain was attempted. IMPRESSIONS  1. Left ventricular ejection fraction, by estimation, is 50%. The left ventricle has mildly decreased function. The left ventricle demonstrates regional wall motion abnormalities with severe hypokinesis of the apical septal wall and true apex. There is mild concentric left ventricular hypertrophy. Left ventricular diastolic parameters are consistent with Grade I diastolic dysfunction (impaired relaxation).  2. Right ventricular systolic function is mildly reduced. The right ventricular size is normal. Tricuspid regurgitation signal is inadequate for assessing PA pressure.  3. Left atrial size was mildly dilated.  4. Right atrial size was mildly dilated.  5. The mitral valve is normal in structure.  No evidence of mitral valve regurgitation. No evidence of mitral stenosis.  6. The aortic valve is tricuspid. There is mild calcification of the aortic valve. Aortic valve regurgitation is not visualized. No aortic stenosis is present.  7. The inferior vena cava is normal in size with greater than 50% respiratory variability, suggesting right atrial pressure of 3 mmHg. FINDINGS  Left Ventricle: Left ventricular ejection fraction, by estimation, is 50%. The left ventricle has mildly decreased function. The left ventricle demonstrates regional wall motion abnormalities. The left ventricular internal cavity size was normal in size. There is mild concentric left ventricular hypertrophy. Left ventricular diastolic parameters are consistent with Grade I diastolic dysfunction (impaired relaxation). Right Ventricle: The right ventricular size is normal. No increase in right ventricular wall thickness. Right ventricular systolic function is mildly reduced. Tricuspid regurgitation signal is inadequate for assessing PA pressure. Left Atrium: Left atrial size was mildly dilated. Right Atrium: Right atrial size was mildly dilated. Pericardium:  There is no evidence of pericardial effusion. Mitral Valve: The mitral valve is normal in structure. There is mild calcification of the mitral valve leaflet(s). Mild mitral annular calcification. No evidence of mitral valve regurgitation. No evidence of mitral valve stenosis. Tricuspid Valve: The tricuspid valve is normal in structure. Tricuspid valve regurgitation is not demonstrated. Aortic Valve: The aortic valve is tricuspid. There is mild calcification of the aortic valve. Aortic valve regurgitation is not visualized. No aortic stenosis is present. Aortic valve mean gradient measures 5.0 mmHg. Aortic valve peak gradient measures 8.8 mmHg. Aortic valve area, by VTI measures 1.05 cm. Pulmonic Valve: The pulmonic valve was normal in structure. Pulmonic valve regurgitation is not visualized. Aorta: The aortic root is normal in size and structure. Venous: The inferior vena cava is normal in size with greater than 50% respiratory variability, suggesting right atrial pressure of 3 mmHg. IAS/Shunts: No atrial level shunt detected by color flow Doppler.  LEFT VENTRICLE PLAX 2D LVIDd:         3.60 cm   Diastology LVIDs:         1.80 cm   LV e' medial:    9.36 cm/s LV PW:         1.20 cm   LV E/e' medial:  11.9 LV IVS:        1.30 cm   LV e' lateral:   12.50 cm/s LVOT diam:     1.90 cm   LV E/e' lateral: 8.9 LV SV:         26 LV SV Index:   12 LVOT Area:     2.84 cm  RIGHT VENTRICLE            IVC RV S prime:     6.96 cm/s  IVC diam: 1.70 cm TAPSE (M-mode): 1.8 cm LEFT ATRIUM           Index        RIGHT ATRIUM           Index LA diam:      2.50 cm 1.18 cm/m   RA Area:     18.30 cm LA Vol (A2C): 23.9 ml 11.24 ml/m  RA Volume:   49.90 ml  23.47 ml/m LA Vol (A4C): 21.9 ml 10.30 ml/m  AORTIC VALVE AV Area (Vmax):    1.07 cm AV Area (Vmean):   0.99 cm AV Area (VTI):     1.05 cm AV Vmax:           148.00 cm/s AV Vmean:  103.000 cm/s AV VTI:            0.244 m AV Peak Grad:      8.8 mmHg AV Mean Grad:      5.0  mmHg LVOT Vmax:         56.10 cm/s LVOT Vmean:        36.000 cm/s LVOT VTI:          0.090 m LVOT/AV VTI ratio: 0.37  AORTA Ao Root diam: 2.40 cm MITRAL VALVE MV Area (PHT): 5.02 cm     SHUNTS MV Decel Time: 151 msec     Systemic VTI:  0.09 m MV E velocity: 111.00 cm/s  Systemic Diam: 1.90 cm MV A velocity: 107.00 cm/s MV E/A ratio:  1.04 Dalton McleanMD Electronically signed by Wilfred Lacy Signature Date/Time: 03/26/2023/2:40:18 PM    Final    CT Head Wo Contrast  Result Date: 03/26/2023 CLINICAL DATA:  Provided history: Facial trauma, blunt. Ataxia, head trauma. Additional history provided: Fall. Patient subsequently became apneic/pulseless. EXAM: CT HEAD WITHOUT CONTRAST CT CERVICAL SPINE WITHOUT CONTRAST TECHNIQUE: Multidetector CT imaging of the head and cervical spine was performed following the standard protocol without intravenous contrast. Multiplanar CT image reconstructions of the cervical spine were also generated. RADIATION DOSE REDUCTION: This exam was performed according to the departmental dose-optimization program which includes automated exposure control, adjustment of the mA and/or kV according to patient size and/or use of iterative reconstruction technique. COMPARISON:  Head CT 07/04/2021.  Cervical spine CT 07/04/2021. FINDINGS: CT HEAD FINDINGS Brain: Generalized parenchymal atrophy. 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 Background mild patchy and ill-defined hypoattenuation within the cerebral white matter, nonspecific but compatible with chronic small ischemic disease. There is no acute intracranial hemorrhage. No acute demarcated cortical infarct. No extra-axial fluid collection. No evidence of an intracranial mass. No midline shift. Vascular: No hyperdense vessel. Atherosclerotic calcifications. Skull: No calvarial fracture or aggressive osseous lesion. Sinuses/Orbits: No mass or acute finding within the imaged orbits.  Small-volume secretions within the left sphenoid sinus. Mild mucosal thickening within the bilateral ethmoid sinuses. Other: Bilateral mastoid effusions. CT CERVICAL SPINE FINDINGS Alignment: Nonspecific straightening of the expected cervical lordosis. No significant spondylolisthesis. Skull base and vertebrae: The basion-dental and atlanto-dental intervals are maintained.No evidence of acute fracture to the cervical spine. Probable facet ankylosis at C7-T1 and T1-T2. Soft tissues and spinal canal: No prevertebral fluid or swelling. No visible canal hematoma. Disc levels: Cervical spondylosis with multilevel disc space narrowing, disc bulges/central disc protrusions, posterior disc osteophyte complexes and uncovertebral hypertrophy. Disc space narrowing is greatest at C5-C6 (moderate-to-advanced) and C6-C7 (advanced). No appreciable high-grade spinal canal stenosis. Multilevel bony neural foraminal narrowing. Multilevel ventrolateral osteophytes. Upper chest: Separately reported on same day CT chest/abdomen/pelvis. Other: Enlarged heterogeneous thyroid gland. Mild enlargement of the right thyroid lobe also noted. IMPRESSION: CT head: 1.  No evidence of an acute intracranial abnormality. 2. 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 3. Background parenchymal atrophy and chronic small vessel ischemic disease. 4. Paranasal sinus disease and bilateral mastoid effusions (in the presence of life-support tubes). CT cervical spine: 1. No evidence of an acute cervical spine fracture. 2. Nonspecific straightening of the expected cervical lordosis. 3. Cervical spondylosis as described. 4. Heterogeneous and mildly enlarged thyroid gland. Given the patient's age, a non-emergent thyroid ultrasound may be obtained for further evaluation as clinically appropriate. Reference: J Am Coll Radiol. 2015 Feb;12(2): 143-50. Electronically Signed   By:  Jackey Loge D.O.   On: 03/26/2023 13:58   CT Cervical  Spine Wo Contrast  Result Date: 03/26/2023 CLINICAL DATA:  Provided history: Facial trauma, blunt. Ataxia, head trauma. Additional history provided: Fall. Patient subsequently became apneic/pulseless. EXAM: CT HEAD WITHOUT CONTRAST CT CERVICAL SPINE WITHOUT CONTRAST TECHNIQUE: Multidetector CT imaging of the head and cervical spine was performed following the standard protocol without intravenous contrast. Multiplanar CT image reconstructions of the cervical spine were also generated. RADIATION DOSE REDUCTION: This exam was performed according to the departmental dose-optimization program which includes automated exposure control, adjustment of the mA and/or kV according to patient size and/or use of iterative reconstruction technique. COMPARISON:  Head CT 07/04/2021.  Cervical spine CT 07/04/2021. FINDINGS: CT HEAD FINDINGS Brain: Generalized parenchymal atrophy. 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 Background mild patchy and ill-defined hypoattenuation within the cerebral white matter, nonspecific but compatible with chronic small ischemic disease. There is no acute intracranial hemorrhage. No acute demarcated cortical infarct. No extra-axial fluid collection. No evidence of an intracranial mass. No midline shift. Vascular: No hyperdense vessel. Atherosclerotic calcifications. Skull: No calvarial fracture or aggressive osseous lesion. Sinuses/Orbits: No mass or acute finding within the imaged orbits. Small-volume secretions within the left sphenoid sinus. Mild mucosal thickening within the bilateral ethmoid sinuses. Other: Bilateral mastoid effusions. CT CERVICAL SPINE FINDINGS Alignment: Nonspecific straightening of the expected cervical lordosis. No significant spondylolisthesis. Skull base and vertebrae: The basion-dental and atlanto-dental intervals are maintained.No evidence of acute fracture to the cervical spine. Probable facet ankylosis at C7-T1 and  T1-T2. Soft tissues and spinal canal: No prevertebral fluid or swelling. No visible canal hematoma. Disc levels: Cervical spondylosis with multilevel disc space narrowing, disc bulges/central disc protrusions, posterior disc osteophyte complexes and uncovertebral hypertrophy. Disc space narrowing is greatest at C5-C6 (moderate-to-advanced) and C6-C7 (advanced). No appreciable high-grade spinal canal stenosis. Multilevel bony neural foraminal narrowing. Multilevel ventrolateral osteophytes. Upper chest: Separately reported on same day CT chest/abdomen/pelvis. Other: Enlarged heterogeneous thyroid gland. Mild enlargement of the right thyroid lobe also noted. IMPRESSION: CT head: 1.  No evidence of an acute intracranial abnormality. 2. 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 3. Background parenchymal atrophy and chronic small vessel ischemic disease. 4. Paranasal sinus disease and bilateral mastoid effusions (in the presence of life-support tubes). CT cervical spine: 1. No evidence of an acute cervical spine fracture. 2. Nonspecific straightening of the expected cervical lordosis. 3. Cervical spondylosis as described. 4. Heterogeneous and mildly enlarged thyroid gland. Given the patient's age, a non-emergent thyroid ultrasound may be obtained for further evaluation as clinically appropriate. Reference: J Am Coll Radiol. 2015 Feb;12(2): 143-50. Electronically Signed   By: Jackey Loge D.O.   On: 03/26/2023 13:58   DG Chest Portable 1 View  Result Date: 03/26/2023 CLINICAL DATA:  Post intubation. EXAM: PORTABLE CHEST 1 VIEW COMPARISON:  Radiographs 03/17/2023 and 07/04/2021. FINDINGS: 1200 hours. Tip of the endotracheal tube is approximately 1.5 cm above the carina. Enteric tube projects below the diaphragm, tip not visualized. The heart is enlarged, but stable. There is aortic atherosclerosis. New pulmonary edema with asymmetric right basilar airspace disease and a possible small  right pleural effusion. No evidence of pneumothorax. No acute osseous findings are evident. External pacer and telemetry leads overlie the chest. IMPRESSION: 1. Tip of the endotracheal tube is just above the carina. Consider withdrawing 2-3 cm for more optimal positioning. 2. New pulmonary edema with asymmetric right basilar airspace disease and  possible small right pleural effusion. Findings suggest congestive heart failure or aspiration. Electronically Signed   By: Carey Bullocks M.D.   On: 03/26/2023 13:30   DG Abdomen 1 View  Result Date: 03/26/2023 CLINICAL DATA:  OGT placement. EXAM: ABDOMEN - 1 VIEW COMPARISON:  None available. FINDINGS: Enteric tube tip projects over the distal stomach. Mild gaseous distention of small-bowel loops in the pelvis. IMPRESSION: Enteric tube tip projects over the distal stomach. Mild gaseous distention of small-bowel loops in the pelvis, nonspecific. Electronically Signed   By: Orvan Falconer M.D.   On: 03/26/2023 12:19     Assessment and Plan:   Unwitnessed Cardiac arrest  CAD status post DES to RCA in 2011 Reported to have been found pulseless and apneic for approximately 5 minutes.  Had 5 minutes of CPR with ROSC.  No shocks or epinephrine was administered.  Rhythm on site was sinus PVCs once EMS arrived.  Also had reported possible seizure-like activity? There is a suspicion for this to be driven by acute infarct.  She was last seen 10 days ago on the 11/16 for atypical chest pain with mildly elevated troponins.  Echocardiogram here shows preserved EF 50% with regional wall motion abnormalities in the apical and septal wall.  Mild LVH, mildly reduced RV function.  EKG showing anterior T wave inversions (however this was also seen in 2023 and seen intermittently before).  Troponins [id number].  With the above there is concern for LAD infarct. However still difficult to say exactly what her etiology is.  There is also some consideration of torsades given magnesium of  1.1 on admission, with undetectable phosphorus levels, weight loss in the past 1 year.  Maybe she has had poor p.o. intake/refeeding?  With her history of PVCs/AV block is possible that that could also have driven arrhythmia.  Her QTc measured by the computer was 588 however reviewed with MD he feels this is closer to 460.  This has now normalized. May consider cardiac catheterization, however need to evaluate if she would be a good candidate with her dementia. Start IV heparin per pharmacy.  Will start aspirin.  No beta-blocker with bradycardia as shock liver with downtrending LFTs, may be able to start statin on discharge. Continue to avoid QT prolonging medications. Maintain potassium greater than 4, magnesium greater than 2  Mobitz 1 There is concern this could be caused by a bradycardic event.  May need cardiac monitor at discharge.  Shock liver Hold statin follow LFTs.  Improving.  Hypotension Pressures are soft however not requiring vasopressor support.  Continue to hold home medications  Chronic HFpEF Echocardiogram as above.  Euvolemic.  Hold antihypertensives.   TIMI Risk Score for Unstable Angina or Non-ST Elevation MI:   The patient's TIMI risk score is 4, which indicates a 20% risk of all cause mortality, new or recurrent myocardial infarction or need for urgent revascularization in the next 14 days.{  New York Heart Association (NYHA) Functional Class NYHA Class unknown        For questions or updates, please contact [facility] Please consult [email or web address] for contact info under    Signed, Abagail Kitchens, PA-C  03/27/2023 1:53 PM  As above, patient seen and examined.  Briefly she is a 77 year old female with past medical history of coronary artery disease with PCI of the right coronary artery, COPD, hypertension, hyperlipidemia, diabetes mellitus, dementia, chronic heart failure preserved ejection fraction for evaluation following presumed cardiac arrest.   Most recent nuclear study  November 2022 showed normal perfusion with ejection fraction 57%.  Echocardiogram this admission shows ejection fraction 50% with apical hypokinesis, grade 1 diastolic dysfunction, mild RV dysfunction, mild biatrial enlargement.  Patient resides in a nursing home.  Patient apparently had fallen on the day of admission.  Staff then noticed that I did not hear her talking and then found her pulseless and apneic by report.  CPR was initiated with ROSC after 5 minutes (patient was not defibrillated and epinephrine was not used).  When EMS arrived she apparently had a pulse and rhythm was sinus with PVCs.  Initial electrocardiogram felt possibly to have prolonged QT interval.  Patient was intubated and initially somewhat hypotensive.  Initial magnesium 1.1.  Initial potassium 4.2.  Troponin is 33, 113 and 281.  I have personally reviewed the patient's initial electrocardiogram.  This showed sinus rhythm with first-degree AV block, occasional PVC, nonspecific ST changes and QTc calculated by my review of 0.46.  Most recent ECG shows sinus bradycardia with Mobitz 1 second-degree AV block and anterior T wave inversion (note anterior TWI intermittently present on past ECGs).  Telemetry personally reviewed and shows sinus rhythm with PVCs, first-degree AV block and intermittent Mobitz 1 second-degree AV block.  1 possible cardiac arrest-etiology of events is unclear.  By report patient was pulseless and apneic and required 5 minutes of CPR.  At time EMS arrived however she was in sinus rhythm.  I have personally reviewed the patient's initial electrocardiogram.  I do not think the QTc is prolonged (it appears that the P wave is included in that measurement).  My calculation of QTc is 0.46.  Torsades also not documented.  Her troponin is mildly elevated but in the setting of CPR.  However there is apical wall motion abnormality on most recent echocardiogram.  Question ischemia mediated event.  Will  treat with aspirin and heparin for now.  At time of my assessment patient is intubated and there is a history of dementia.  Will need to reassess after extubation to see if she is a candidate for aggressive cardiac procedures.  If so she will likely need cardiac catheterization to rule out obstructive coronary disease.  Finally she has developed Mobitz 1 second-degree AV block.  Question if she had a bradycardia mediated event.  However she has had no long pauses documented on telemetry.  Would not resume beta-blockade.  May need outpatient monitor if she improves.  For now would follow on telemetry.  2 history of coronary artery disease-we will add aspirin 81 mg daily and resume statin at discharge.  3 respiratory failure with aspiration-patient is being treated with antibiotics per CCM.  Ventilator weaning also per critical care.  4 hyperlipidemia-will resume statin at discharge.  5 history of hypertension-patient's blood pressure medications are on hold as she was hypotensive at time of admission.  Will follow.  6 elevated liver functions-likely secondary to shock liver from recent cardiac arrest.  Trending down.  Olga Millers, MD

## 2023-03-27 NOTE — Consult Note (Addendum)
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 Stomal assessment/size: 2/8 with an opening 9 o`clock, no protrusion Peristomal assessment: intact Output solid Ostomy pouching: 1pc. [id number] and barrier ring [id number]. Order supplies for the room: 4 sets. Education provided: She needs a total assistance to change the ostomy pouch.   Enrolled patient in [employer] DC program: No   WOC team will not plan to follow further.  Please reconsult if further assistance is needed. Thank-you,  Denyse Amass BSN, RN, [employer], WOC  (Pager: [phone])

## 2023-03-27 NOTE — Progress Notes (Signed)
Bilateral lower extremity venous duplex has been completed. Preliminary results can be found in CV Proc through chart review.   03/27/23 12:34 PM Olen Cordial RVT

## 2023-03-27 NOTE — Progress Notes (Addendum)
eLink Physician-Brief Progress Note Patient Name: LARIE PALAS DOB: [birth year] MRN: [id number]   Date of Service  03/27/2023  HPI/Events of Note  Bedside Rn did get an EKG and it shows a new first degree heart block. The previous EKG in Epic was from 12 hours prior. It definitely looks like a rhythm change.   EKG qtc prolongation > 500. AV 1 st degree block, PVC's.  Camera re eval done. Stable Vitals.  Discussed with RN.  eICU Interventions  Already sent AM labs-BMP, mag, calcium, cbc.  Keep K/Mag > 4/2 respectively. HOLD Flagyl( low risk medication causes qtc prolongation) - earlier had revefenacin, a long acting muscarinic receptor nebulization , once for her COPD( not at risk for qtc prolongation).      Intervention Category Intermediate Interventions: Arrhythmia - evaluation and management  Ranee Gosselin 03/27/2023, 12:34 AM  01:10 Pharmacist went over qtc prolongation meds and advised /suggesting to start versed drip, instead of propofol ( can cause qtc prolongation). Admitted  for suspected torsades. Versed ordered. DC ed prn fentanyl, propofol ( not on any infusion for pain any more) and zofran prn .   01:38 AM Hypophosphatemia, and hypokalemia. - kphos /kcl ordered stat. Magnesium elevated, ast/alt improving.

## 2023-03-27 NOTE — Progress Notes (Signed)
PHARMACY - ANTICOAGULATION CONSULT NOTE  Pharmacy Consult for heparin gtt Indication: chest pain/ACS  Allergies  Allergen Reactions   Egg-Derived Products Swelling    Listed on [facility] 07/04/21 Patient reports had face swelling.    Penicillins Other (See Comments)    Patient Measurements: Height: 5\' 3"  (160 cm) Weight: 87.2 kg (192 lb 3.9 oz) IBW/kg (Calculated) : 52.4 Heparin Dosing Weight: 79.9 kg   Vital Signs: Temp: 97.9 F (36.6 C) (11/26 1330) Temp Source: Bladder (11/26 0800) BP: 105/56 (11/26 1330) Pulse Rate: 52 (11/26 1330)  Labs: Recent Labs    03/26/23 1135 03/26/23 1136 03/26/23 1426 03/26/23 1438 03/26/23 1829 03/27/23 0043 03/27/23 1128  HGB 9.9*   < > 9.1* 10.2*  --  8.6*  --   HCT 36.2   < > 31.4* 30.0*  --  29.7*  --   PLT 262  --  182  --   --  178  --   CREATININE  --    < > 1.05*  --   --  0.92 1.08*  TROPONINIHS 33*  --  113*  --  281*  --   --    < > = values in this interval not displayed.    Estimated Creatinine Clearance: 44.2 mL/min (A) (by C-G formula based on SCr of 1.08 mg/dL (H)).   Medical History: Past Medical History:  Diagnosis Date   Anemia    Arthritis    Asthma    CAD (coronary artery disease)    COPD (chronic obstructive pulmonary disease) (HCC)    Diabetes mellitus    Hyperlipidemia    Hypertension    Myocardial infarct (HCC)    Postmenopausal atrophic vaginitis    Restless leg syndrome     Assessment: 77 yo F with suspected ACS event leading to cardiac arrest. Pharmacy consulted for heparin gtt.   Hgb 8.6, Plt 178  Trop 113>281   Goal of Therapy:  Heparin level 0.3-0.7 units/ml Monitor platelets by anticoagulation protocol: Yes   Plan:  Heparin 4000 units IV x1 followed by  Heparin infusion at 950 units/hr 8hr HL  Daily HL, CBC F/u s/sx bleeding and cardiology plans   Calton Dach, PharmD, BCCCP Clinical Pharmacist 03/27/2023 2:28 PM

## 2023-03-27 NOTE — Progress Notes (Signed)
Initial Nutrition Assessment  DOCUMENTATION CODES:  Not applicable  INTERVENTION:  If not extubated, recommend initiation of TF via OGT: Osmolite 1.2 at 55 ml/h (1320 ml per day) Start at 15 and advance by 10mL q8h to goal of 55 Prosource TF20 60 ml 1x/d Provides 1664 kcal, 93 gm protein, 1082 ml free water daily Thiamine 100mg  x 5 days  NUTRITION DIAGNOSIS:  Inadequate oral intake related to inability to eat as evidenced by NPO status.  GOAL:  Patient will meet greater than or equal to 90% of their needs  MONITOR:  TF tolerance, I & O's, Vent status, Labs  REASON FOR ASSESSMENT:  Ventilator    ASSESSMENT:  Pt with hx of COPD, DM type 2, HTN, HLD, CAD, dementia, CHF, and hx MI presented to ED from facility with cardiac arrest after a fall. CPR done at facility with ROSC in ~5 minutes. Intubated on arrival to ED for airway protection.  11/25 - admitted s/p cardiac arrest, intubated   Patient is currently intubated on ventilator support. OGT in place noted to terminate in the distal stomach.   No family in room at the time of assessment to provide a nutrition hx. Discussed with RN. Reports that initial plan was to attempt to wake up pt and wean from ventilator this AM  but that pt's mentation waxes and wanes. Discussed in rounds, hopeful to be able to try weaning again this afternoon. If not extubated, MD ok to start TF.  On exam, pt with some muscle deficits. Unsure of baseline mobility status which could be playing a role. Noted electrolyte abnormalities on labs this AM. RPH replacing and monitoring. Will advance TF slowly if they are to be initiated in the event that pt may be refeeding.  MV: 8.9 L/min Temp (24hrs), Avg:98.2 F (36.8 C), Min:95.9 F (35.5 C), Max:99.9 F (37.7 C)  Admit weight: 113.4 kg ? accuracy  Current weight: 87.2 kg - first measured weight this admission   Intake/Output Summary (Last 24 hours) at 03/27/2023 1253 Last data filed at 03/27/2023  1200 Gross per 24 hour  Intake 2166.7 ml  Output 820 ml  Net 1346.7 ml  Net IO Since Admission: 2,358.7 mL [03/27/23 1253]  Drains/Lines: Colostomy to the LLQ  Nutritionally Relevant Medications: Scheduled Meds:  atropine       docusate  100 mg Per Tube BID   famotidine  20 mg Per Tube BID   insulin aspart  0-9 Units Subcutaneous Q4H   pantoprazole IV  40 mg Intravenous QHS   polyethylene glycol  17 g Per Tube Daily   Continuous Infusions:  cefTRIAXone (ROCEPHIN)  IV 200 mL/hr at 03/27/23 1000   dexmedetomidine (PRECEDEX) IV infusion 0.4 mcg/kg/hr (03/27/23 1000)   PRN Meds: docusate, polyethylene glycol  Labs Reviewed: K 3.0 Phosphorus <1.0 Mg 3.2 CBG ranges from 84-225 mg/dL over the last 24 hours HgbA1c 6.1%  NUTRITION - FOCUSED PHYSICAL EXAM: Flowsheet Row Most Recent Value  Orbital Region No depletion  Upper Arm Region No depletion  Thoracic and Lumbar Region No depletion  Buccal Region No depletion  Temple Region Mild depletion  Clavicle Bone Region No depletion  Clavicle and Acromion Bone Region Mild depletion  Scapular Bone Region Mild depletion  Dorsal Hand Unable to assess  [mittens]  Patellar Region Moderate depletion  Anterior Thigh Region Moderate depletion  Posterior Calf Region Severe depletion  Edema (RD Assessment) Moderate  [generalized]  Hair Reviewed  Eyes Unable to assess  [kept closed]  Mouth Reviewed  Skin Reviewed  Nails Unable to assess  [mittens]   Diet Order:   Diet Order             Diet NPO time specified  Diet effective now                   EDUCATION NEEDS:  Not appropriate for education at this time  Skin:  Skin Assessment: Reviewed RN Assessment MASD - bilateral lower breasts  Last BM:  11/25 via colostomy  Height:  Ht Readings from Last 1 Encounters:  03/26/23 5\' 3"  (1.6 m)    Weight:  Wt Readings from Last 1 Encounters:  03/27/23 87.2 kg    Ideal Body Weight:  52.3 kg  BMI:  Body mass index is  34.05 kg/m.  Estimated Nutritional Needs:  Kcal:  1500-1700 kcal/d Protein:  80-95 g/d Fluid:  1.5L/d    Greig Castilla, RD, LDN Registered Dietitian II RD pager # available in AMION  After hours/weekend pager # available in [facility]

## 2023-03-28 DIAGNOSIS — I469 Cardiac arrest, cause unspecified: Secondary | ICD-10-CM | POA: Diagnosis not present

## 2023-03-28 DIAGNOSIS — J9601 Acute respiratory failure with hypoxia: Secondary | ICD-10-CM | POA: Diagnosis not present

## 2023-03-28 LAB — GLUCOSE, CAPILLARY
Glucose-Capillary: 117 mg/dL — ABNORMAL HIGH (ref 70–99)
Glucose-Capillary: 181 mg/dL — ABNORMAL HIGH (ref 70–99)
Glucose-Capillary: 125 mg/dL — ABNORMAL HIGH (ref 70–99)
Glucose-Capillary: 88 mg/dL (ref 70–99)
Glucose-Capillary: 116 mg/dL — ABNORMAL HIGH (ref 70–99)

## 2023-03-28 LAB — HEPATIC FUNCTION PANEL
ALT: 285 U/L — ABNORMAL HIGH (ref 0–44)
AST: 99 U/L — ABNORMAL HIGH (ref 15–41)
Albumin: 2.5 g/dL — ABNORMAL LOW (ref 3.5–5.0)
Alkaline Phosphatase: 105 U/L (ref 38–126)
Bilirubin, Direct: <0.1 mg/dL (ref 0.0–0.2)
Total Bilirubin: 0.6 mg/dL (ref <1.2)
Total Protein: 5.3 g/dL — ABNORMAL LOW (ref 6.5–8.1)

## 2023-03-28 LAB — RENAL FUNCTION PANEL
Albumin: 2.5 g/dL — ABNORMAL LOW (ref 3.5–5.0)
Anion gap: 12 (ref 5–15)
BUN: 23 mg/dL (ref 8–23)
CO2: 27 mmol/L (ref 22–32)
Calcium: 8.3 mg/dL — ABNORMAL LOW (ref 8.9–10.3)
Chloride: 104 mmol/L (ref 98–111)
Creatinine, Ser: 1.01 mg/dL — ABNORMAL HIGH (ref 0.44–1.00)
GFR, Estimated: 57 mL/min — ABNORMAL LOW (ref >60)
Glucose, Bld: 176 mg/dL — ABNORMAL HIGH (ref 70–99)
Phosphorus: 5.3 mg/dL — ABNORMAL HIGH (ref 2.5–4.6)
Potassium: 3.6 mmol/L (ref 3.5–5.1)
Sodium: 143 mmol/L (ref 135–145)

## 2023-03-28 LAB — HEPARIN LEVEL (UNFRACTIONATED): Heparin Unfractionated: 0.42 [IU]/mL (ref 0.30–0.70)

## 2023-03-28 LAB — CBC
HCT: 27.9 % — ABNORMAL LOW (ref 36.0–46.0)
Hemoglobin: 8.1 g/dL — ABNORMAL LOW (ref 12.0–15.0)
MCH: 24.9 pg — ABNORMAL LOW (ref 26.0–34.0)
MCHC: 29 g/dL — ABNORMAL LOW (ref 30.0–36.0)
MCV: 85.8 fL (ref 80.0–100.0)
Platelets: 163 10*3/uL (ref 150–400)
RBC: 3.25 MIL/uL — ABNORMAL LOW (ref 3.87–5.11)
RDW: 14.7 % (ref 11.5–15.5)
WBC: 12 10*3/uL — ABNORMAL HIGH (ref 4.0–10.5)
nRBC: 0 % (ref 0.0–0.2)

## 2023-03-28 LAB — TROPONIN I (HIGH SENSITIVITY): Troponin I (High Sensitivity): 176 ng/L (ref <18)

## 2023-03-28 LAB — MAGNESIUM: Magnesium: 2.5 mg/dL — ABNORMAL HIGH (ref 1.7–2.4)

## 2023-03-28 MED ORDER — ASPIRIN 81 MG PO CHEW: 81.0000 mg | CHEWABLE_TABLET | Freq: Every day | ORAL | Status: DC

## 2023-03-28 MED ORDER — POLYETHYLENE GLYCOL 3350 17 G PO PACK: 17.0000 g | PACK | Freq: Every day | ORAL | Status: DC | PRN

## 2023-03-28 MED ORDER — MELATONIN 3 MG PO TABS
3.0000 mg | ORAL_TABLET | Freq: Every day | ORAL | Status: DC
  Administered 2023-03-28 – 2023-03-30 (×3): 3 mg via ORAL
  Filled 2023-03-28 (×3): qty 1

## 2023-03-28 MED ORDER — ASPIRIN 81 MG PO TBEC: 81.0000 mg | DELAYED_RELEASE_TABLET | Freq: Every day | ORAL | Status: DC

## 2023-03-28 MED ORDER — ASPIRIN 81 MG PO CHEW
81.0000 mg | CHEWABLE_TABLET | Freq: Every day | ORAL | Status: DC
  Administered 2023-03-28 – 2023-04-03 (×7): 81 mg via ORAL
  Filled 2023-03-28 (×7): qty 1

## 2023-03-28 MED ORDER — ATORVASTATIN CALCIUM 80 MG PO TABS
80.0000 mg | ORAL_TABLET | Freq: Every day | ORAL | Status: DC
  Administered 2023-03-28 – 2023-04-03 (×7): 80 mg via ORAL
  Filled 2023-03-28 (×5): qty 1
  Filled 2023-03-28: qty 2
  Filled 2023-03-28: qty 1

## 2023-03-28 MED ORDER — ATORVASTATIN CALCIUM 40 MG PO TABS: 80.0000 mg | ORAL_TABLET | Freq: Every day | ORAL | Status: DC

## 2023-03-28 MED ORDER — POTASSIUM CHLORIDE 20 MEQ PO PACK
40.0000 meq | PACK | Freq: Once | ORAL | Status: AC
  Administered 2023-03-28: 40 meq
  Filled 2023-03-28: qty 2

## 2023-03-28 MED ORDER — THIAMINE MONONITRATE 100 MG PO TABS
100.0000 mg | ORAL_TABLET | Freq: Every day | ORAL | Status: AC
  Administered 2023-03-28 – 2023-03-30 (×3): 100 mg via ORAL
  Filled 2023-03-28 (×3): qty 1

## 2023-03-28 NOTE — Progress Notes (Signed)
IVT consult placed for PIV placement in order to remove CVC. Arms assessed bilaterally with [place]. Unable to identify appropriate vessels for PIV placement. Primary RN notified of findings. Recommend leaving CVC in place to ensure reliable access remains during hospitalization.   Harman Ferrin Loyola Mast, RN

## 2023-03-28 NOTE — Progress Notes (Signed)
[facility] ADULT ICU REPLACEMENT PROTOCOL   The patient does apply for the [facility] Adult ICU Electrolyte Replacment Protocol based on the criteria listed below:   1.Exclusion criteria: TCTS, ECMO, Dialysis, and Myasthenia Gravis patients 2. Is GFR >/= 30 ml/min? Yes.    Patient's GFR today is 57 3. Is SCr </= 2? Yes.   Patient's SCr is 1.01 mg/dL 4. Did SCr increase >/= 0.5 in 24 hours? No. 5.Pt's weight >40kg  Yes.   6. Abnormal electrolyte(s):  K 3.6  7. Electrolytes replaced per protocol 8.  Call MD STAT for K+ </= 2.5, Phos </= 1, or Mag </= 1 Physician:  Shawn Stall R Andoni Busch 03/28/2023 5:44 AM

## 2023-03-28 NOTE — Progress Notes (Signed)
PHARMACY - ANTICOAGULATION CONSULT NOTE  Pharmacy Consult for heparin gtt Indication: chest pain/ACS  Allergies  Allergen Reactions   Egg-Derived Products Swelling    Listed on [facility] 07/04/21 Patient reports had face swelling.    Penicillins Other (See Comments)    Patient Measurements: Height: 5\' 3"  (160 cm) Weight: 88.5 kg (195 lb 1.7 oz) IBW/kg (Calculated) : 52.4 Heparin Dosing Weight: 79.9 kg   Vital Signs: Temp: 97.5 F (36.4 C) (11/27 0630) Temp Source: Bladder (11/27 0400) BP: 114/79 (11/27 0630) Pulse Rate: 47 (11/27 0630)  Labs: Recent Labs    03/26/23 1135 03/26/23 1136 03/26/23 1426 03/26/23 1438 03/26/23 1829 03/27/23 0043 03/27/23 1128 03/27/23 2310 03/28/23 0503  HGB 9.9*   < > 9.1* 10.2*  --  8.6*  --   --  8.1*  HCT 36.2   < > 31.4* 30.0*  --  29.7*  --   --  27.9*  PLT 262  --  182  --   --  178  --   --  163  HEPARINUNFRC  --   --   --   --   --   --   --  0.55 0.42  CREATININE  --    < > 1.05*  --   --  0.92 1.08* 1.01* 1.01*  TROPONINIHS 33*  --  113*  --  281*  --   --   --   --    < > = values in this interval not displayed.    Estimated Creatinine Clearance: 47.6 mL/min (A) (by C-G formula based on SCr of 1.01 mg/dL (H)).   Medical History: Past Medical History:  Diagnosis Date   Anemia    Arthritis    Asthma    CAD (coronary artery disease)    COPD (chronic obstructive pulmonary disease) (HCC)    Diabetes mellitus    Hyperlipidemia    Hypertension    Myocardial infarct (HCC)    Postmenopausal atrophic vaginitis    Restless leg syndrome     Assessment: 77 yo F with suspected ACS event leading to cardiac arrest. Pharmacy consulted for heparin gtt.   Hgb 8.1, Plt 163  Trop 113>281  HL 0.42 - therapeutic   Goal of Therapy:  Heparin level 0.3-0.7 units/ml Monitor platelets by anticoagulation protocol: Yes   Plan:  Continue heparin infusion at 950 units/hr Daily HL, CBC F/u s/sx bleeding and cardiology plans   Calton Dach, PharmD, BCCCP Clinical Pharmacist 03/28/2023 7:42 AM

## 2023-03-28 NOTE — Procedures (Signed)
Extubation Procedure Note  Patient Details:   Name: Courtney [name] DOB: [birth year] MRN: [id number]   Airway Documentation:    Vent end date: 03/28/23 Vent end time: 0928   Evaluation  O2 sats: stable throughout Complications: No apparent complications Patient did tolerate procedure well. Bilateral Breath Sounds: Clear, Diminished   Yes Pt extubated to 2L [place]. Positive cuff leak noted. Allen Norris 03/28/2023, 9:28 AM

## 2023-03-28 NOTE — Progress Notes (Signed)
Rounding Note    Patient Name: Courtney [name] Date of Encounter: 03/28/2023  [facility] Cardiologist: Dietrich Pates MD  Subjective   Intubated; alert; shakes head no to CP or dyspnea  Inpatient Medications    Scheduled Meds:  arformoterol  15 mcg Nebulization BID   budesonide (PULMICORT) nebulizer solution  0.25 mg Nebulization BID   Chlorhexidine Gluconate Cloth  6 each Topical Daily   docusate  100 mg Per Tube BID   famotidine  20 mg Per Tube BID   feeding supplement (PROSource TF20)  60 mL Per Tube Daily   insulin aspart  0-9 Units Subcutaneous Q4H   mupirocin ointment  1 Application Nasal BID   mouth rinse  15 mL Mouth Rinse Q2H   pantoprazole (PROTONIX) IV  40 mg Intravenous QHS   polyethylene glycol  17 g Per Tube Daily   revefenacin  175 mcg Nebulization Daily   thiamine  100 mg Per Tube Daily   Continuous Infusions:  cefTRIAXone (ROCEPHIN)  IV Stopped (03/27/23 1026)   feeding supplement (OSMOLITE 1.2 CAL) 35 mL/hr at 03/28/23 0707   heparin 950 Units/hr (03/28/23 0600)   PRN Meds: docusate, ipratropium-albuterol, mouth rinse, polyethylene glycol   Vital Signs    Vitals:   03/28/23 0500 03/28/23 0530 03/28/23 0600 03/28/23 0630  BP: (!) 97/52 (!) 114/57 (!) 110/58 114/79  Pulse: 60 62 60 (!) 47  Resp: 17 16 17  (!) 21  Temp: 98.6 F (37 C) 98.1 F (36.7 C) (!) 97.5 F (36.4 C) (!) 97.5 F (36.4 C)  TempSrc:      SpO2: 100% 100% 100% 100%  Weight: 88.5 kg     Height:        Intake/Output Summary (Last 24 hours) at 03/28/2023 0722 Last data filed at 03/28/2023 0600 Gross per 24 hour  Intake 1693.99 ml  Output 570 ml  Net 1123.99 ml      03/28/2023    5:00 AM 03/27/2023    5:00 AM 03/26/2023   11:58 AM  Last 3 Weights  Weight (lbs) 195 lb 1.7 oz 192 lb 3.9 oz 250 lb  Weight (kg) 88.5 kg 87.2 kg 113.399 kg      Telemetry    Sinus with intermittent mobitz 1 AV block - Personally Reviewed  Physical Exam   GEN: No acute  distress.  Intubated Neck: No JVD Cardiac: RRR Respiratory: Clear to auscultation bilaterally. GI: Soft, nontender, non-distended  MS: No edema Neuro:  Nonfocal  Psych: Normal affect   Labs    High Sensitivity Troponin:   Recent Labs  Lab 03/17/23 1713 03/17/23 2005 03/26/23 1135 03/26/23 1426 03/26/23 1829  TROPONINIHS 18* 20* 33* 113* 281*     Chemistry Recent Labs  Lab 03/26/23 1136 03/26/23 1144 03/27/23 0043 03/27/23 1128 03/27/23 2310 03/28/23 0503  NA 144   < > 143 144 138 143  K 4.2   < > 3.0* 3.6 3.8 3.6  CL 101   < > 104 104 101 104  CO2 28  --  30 30 28 27   GLUCOSE 188*   < > 117* 153* 112* 176*  BUN 17   < > 19 18 23 23   CREATININE 1.08*   < > 0.92 1.08* 1.01* 1.01*  CALCIUM 9.4  --  9.1 8.8* 8.2* 8.3*  MG  --    < > 3.2* 2.9*  --  2.5*  PROT 6.9  --  6.1*  --   --  5.3*  ALBUMIN 3.3*  --  3.0*  3.0*  --  2.6* 2.5*  2.5*  AST 704*  --  540*  --   --  99*  ALT 729*  --  555*  --   --  285*  ALKPHOS 167*  --  138*  --   --  105  BILITOT 0.7  --  0.7  --   --  0.6  GFRNONAA 52*   < > >60 52* 57* 57*  ANIONGAP 15  --  9 10 9 12    < > = values in this interval not displayed.    Lipids  Recent Labs  Lab 03/27/23 0043  TRIG 97    Hematology Recent Labs  Lab 03/26/23 1426 03/26/23 1438 03/27/23 0043 03/28/23 0503  WBC 15.1*  --  15.0* 12.0*  RBC 3.59*  --  3.51* 3.25*  HGB 9.1* 10.2* 8.6* 8.1*  HCT 31.4* 30.0* 29.7* 27.9*  MCV 87.5  --  84.6 85.8  MCH 25.3*  --  24.5* 24.9*  MCHC 29.0*  --  29.0* 29.0*  RDW 13.7  --  14.0 14.7  PLT 182  --  178 163    BNP Recent Labs  Lab 03/26/23 1426  BNP 202.9*      Radiology    VAS [place] LOWER EXTREMITY VENOUS (DVT)  Result Date: 03/27/2023  Lower Venous DVT Study Patient Name:  Courtney [name]  Date of Exam:   03/27/2023 Medical Rec #: [id number]        Accession #:    [id number] Date of Birth: 02-14-1946         Patient Gender: F Patient Age:   77 years Exam Location:  [facility]  Procedure:      VAS [place] LOWER EXTREMITY VENOUS (DVT) Referring Phys: Mertha Baars --------------------------------------------------------------------------------  Indications: Edema.  Risk Factors: None identified. Limitations: Poor ultrasound/tissue interface, body habitus and patient positioning. Comparison Study: No prior studies. Performing Technologist: Chanda Busing RVT  Examination Guidelines: A complete evaluation includes B-mode imaging, spectral Doppler, color Doppler, and power Doppler as needed of all accessible portions of each vessel. Bilateral testing is considered an integral part of a complete examination. Limited examinations for reoccurring indications may be performed as noted. The reflux portion of the exam is performed with the patient in reverse Trendelenburg.  +---------+---------------+---------+-----------+----------+--------------+ RIGHT    CompressibilityPhasicitySpontaneityPropertiesThrombus Aging +---------+---------------+---------+-----------+----------+--------------+ CFV      Full           Yes      Yes                                 +---------+---------------+---------+-----------+----------+--------------+ SFJ      Full                                                        +---------+---------------+---------+-----------+----------+--------------+ FV Prox  Full                                                        +---------+---------------+---------+-----------+----------+--------------+ FV Mid   Full                                                        +---------+---------------+---------+-----------+----------+--------------+  FV DistalFull                                                        +---------+---------------+---------+-----------+----------+--------------+ PFV      Full                                                        +---------+---------------+---------+-----------+----------+--------------+ POP       Full           Yes      Yes                                 +---------+---------------+---------+-----------+----------+--------------+ PTV      Full                                                        +---------+---------------+---------+-----------+----------+--------------+ PERO     Full                                                        +---------+---------------+---------+-----------+----------+--------------+   +---------+---------------+---------+-----------+----------+--------------+ LEFT     CompressibilityPhasicitySpontaneityPropertiesThrombus Aging +---------+---------------+---------+-----------+----------+--------------+ CFV      Full           Yes      Yes                                 +---------+---------------+---------+-----------+----------+--------------+ SFJ      Full                                                        +---------+---------------+---------+-----------+----------+--------------+ FV Prox  Full                                                        +---------+---------------+---------+-----------+----------+--------------+ FV Mid   Full                                                        +---------+---------------+---------+-----------+----------+--------------+ FV DistalFull           Yes      Yes                                 +---------+---------------+---------+-----------+----------+--------------+  PFV      Full                                                        +---------+---------------+---------+-----------+----------+--------------+ POP      Full           Yes      Yes                                 +---------+---------------+---------+-----------+----------+--------------+ PTV      Full                                                        +---------+---------------+---------+-----------+----------+--------------+ PERO     Full                                                         +---------+---------------+---------+-----------+----------+--------------+     Summary: RIGHT: - There is no evidence of deep vein thrombosis in the lower extremity. However, portions of this examination were limited- see technologist comments above.  - No cystic structure found in the popliteal fossa.  LEFT: - There is no evidence of deep vein thrombosis in the lower extremity. However, portions of this examination were limited- see technologist comments above.  - No cystic structure found in the popliteal fossa.  *See table(s) above for measurements and observations. Electronically signed by Lemar Livings MD on 03/27/2023 at 2:29:22 PM.    Final    DG CHEST PORT 1 VIEW  Result Date: 03/27/2023 CLINICAL DATA:  Pneumonia, intubated EXAM: PORTABLE CHEST 1 VIEW COMPARISON:  03/26/2023 FINDINGS: Single frontal view of the chest demonstrates endotracheal tube overlying tracheal air column, tip 1.2 cm above carina. Enteric catheter passes below diaphragm tip excluded by collimation. Stable left subclavian central venous catheter tip overlying superior vena cava. The cardiac silhouette remains enlarged. Marked improved aeration of the right upper lobe since prior study. There are widespread interstitial and ground-glass opacities throughout the lungs, otherwise unchanged. Continued retrocardiac consolidation consistent with atelectasis or dense airspace disease. No effusion or pneumothorax. No acute bony abnormalities. IMPRESSION: 1. Persistent widespread interstitial and ground-glass airspace disease. 2. Persistent retrocardiac consolidation favoring atelectasis. 3. Marked improvement in aeration of the right upper lobe since prior study. Electronically Signed   By: Sharlet Salina M.D.   On: 03/27/2023 09:16   EEG adult  Result Date: 03/26/2023 Charlsie Quest, MD     03/26/2023  7:51 PM Patient Name: Courtney [name] MRN: [id number] Epilepsy Attending: Charlsie Quest Referring  Physician/Provider: Cristopher Peru, PA-C Date: 03/26/2023 Duration: 22.22 mins Patient history: 77yo F s/p cardiac arrest getting eeg to evaluate for seizure Level of alertness:  comatose AEDs during EEG study: Propofol, Versed Technical aspects: This EEG study was done with scalp electrodes positioned according to the 10-20 International system of electrode placement. Electrical activity was reviewed with band pass filter of 1-70Hz , sensitivity of 7 uV/mm, display  speed of 10mm/sec with a 60Hz  notched filter applied as appropriate. EEG data were recorded continuously and digitally stored.  Video monitoring was available and reviewed as appropriate. Description: EEG showed continuous generalized 3 to 6 Hz theta-delta slowing. Patient was noted to have chewing movements at times during the study. Concomitant EEG before, during and after the event did not show any EEG changes suggest seizure. Hyperventilation and photic stimulation were not performed.   ABNORMALITY - Continuous slow, generalized IMPRESSION: This study is suggestive of moderate to severe diffuse encephalopathy. No seizures or epileptiform discharges were seen throughout the recording. Patient was noted to have chewing movements at times during the study without concomitant EEG change. These events were NOT epileptic. Priyanka Annabelle Harman   DG CHEST PORT 1 VIEW  Result Date: 03/26/2023 CLINICAL DATA:  Endotracheally intubated, central line placement EXAM: PORTABLE CHEST 1 VIEW COMPARISON:  03/26/2023 FINDINGS: Left central line tip in the SVC. No pneumothorax. Endotracheal tube slightly retracted since prior study, now 2.8 cm above the carina. NG tube is in the stomach. Heart borderline in size. Left lower lobe and right upper lobe airspace opacities more confluent than prior study. Mild vascular congestion and diffuse interstitial prominence. No visible effusions or pneumothorax. IMPRESSION: Left central line tip in the SVC.  No pneumothorax. Left  lower lobe and right upper lobe airspace disease could reflect atelectasis/collapse or pneumonia. Vascular congestion and interstitial prominence which could reflect interstitial edema. Electronically Signed   By: Charlett Nose M.D.   On: 03/26/2023 17:27   CT CHEST ABDOMEN PELVIS WO CONTRAST  Result Date: 03/26/2023 CLINICAL DATA:  Altered mental status following resuscitation with sepsis EXAM: CT CHEST, ABDOMEN AND PELVIS WITHOUT CONTRAST TECHNIQUE: Multidetector CT imaging of the chest, abdomen and pelvis was performed following the standard protocol without IV contrast. RADIATION DOSE REDUCTION: This exam was performed according to the departmental dose-optimization program which includes automated exposure control, adjustment of the mA and/or kV according to patient size and/or use of iterative reconstruction technique. COMPARISON:  Same day chest radiograph, CT abdomen and pelvis dated 06/25/2018 FINDINGS: CT CHEST FINDINGS Cardiovascular: Normal heart size. No significant pericardial fluid/thickening. Great vessels are normal in course and caliber. Coronary artery calcifications. Mediastinum/Nodes: Diffusely heterogeneous thyroid gland. Normal esophagus. No pathologically enlarged axillary, supraclavicular, mediastinal, or hilar lymph nodes. Lungs/Pleura: ETT terminates in the right mainstem bronchus. Layering secretions within the trachea. The central airways are patent. Diffuse peribronchial wall thickening throughout. Asymmetric right-greater-than-left interlobular septal thickening. A few scattered tree-in-bud nodules in the bilateral upper and middle lobes. Near-complete atelectasis of bilateral lower lobes. No pneumothorax. Small right and moderate left pleural effusions. Musculoskeletal: No acute or abnormal lytic or blastic osseous lesions. Multilevel degenerative changes of the thoracic spine. CT ABDOMEN PELVIS FINDINGS Hepatobiliary: No focal hepatic lesions. No intra or extrahepatic biliary  ductal dilation. Cholecystectomy. Pancreas: No focal lesions or main ductal dilation. Spleen: Normal in size without focal abnormality. Adrenals/Urinary Tract: No adrenal nodules. No suspicious renal mass on this noncontrast enhanced examination , calculi, or hydronephrosis. Bladder is decompressed with catheter in-situ. Stomach/Bowel: Enteric tube terminates in the gastric antrum. Left lower quadrant colostomy. No evidence of bowel wall thickening, distention, or inflammatory changes. Appendix is not discretely seen. Vascular/Lymphatic: Aortic atherosclerosis. No enlarged abdominal or pelvic lymph nodes. Reproductive: No adnexal masses. Other: Trace free fluid. No fluid collection or free air. Musculoskeletal: No acute or abnormal lytic or blastic osseous findings. Multilevel degenerative changes of the lumbar spine. Postsurgical changes of the anterior abdominal  wall. Superior endplate compression of L2. Irregular soft tissue thickening in the right inguinal region and anterior peritoneum, likely postsurgical. IMPRESSION: 1. ETT terminates in the proximal right main bronchus. Recommend retraction. 2. Asymmetric right-greater-than-left interlobular septal thickening, likely pulmonary edema. 3. Small right and moderate left pleural effusions with near-complete atelectasis of bilateral lower lobes. 4. A few scattered tree-in-bud nodules in the bilateral upper and middle lobes, likely infectious/inflammatory. 5. No acute abdominopelvic findings. 6. Superior endplate compression of L2, age indeterminate. 7. Heterogeneous thyroid gland. In the setting of significant comorbidities or limited life expectancy, no follow-up would be recommended. Otherwise, nonemergent ultrasound examination of the thyroid. (Ref: J Am Coll Radiol. 2015 Feb;12(2): 143-50). 8. Aortic Atherosclerosis (ICD10-I70.0). Coronary artery calcifications. Assessment for potential risk factor modification, dietary therapy or pharmacologic therapy may be  warranted, if clinically indicated. Critical Value/emergent results were called by telephone at the time of interpretation on 03/26/2023 at 3:14 pm to provider Dr. Welton Flakes, who verbally acknowledged these results. Electronically Signed   By: Agustin Cree M.D.   On: 03/26/2023 15:17   ECHOCARDIOGRAM COMPLETE  Result Date: 03/26/2023    ECHOCARDIOGRAM REPORT   Patient Name:   Courtney [name] Date of Exam: 03/26/2023 Medical Rec #:  [id number]       Height:       63.0 in Accession #:    [id number]      Weight:       250.0 lb Date of Birth:  November 26, 1945        BSA:          2.126 m Patient Age:    37 years        BP:           162/70 mmHg Patient Gender: F               HR:           96 bpm. Exam Location:  Inpatient Procedure: 2D Echo, Cardiac Doppler, Color Doppler and Strain Analysis Indications:    cardiac arrest  History:        Patient has prior history of Echocardiogram examinations, most                 recent 07/05/2021. Risk Factors:Hypertension, Sleep Apnea,                 Diabetes and Former Smoker.  Sonographer:    Karma Ganja Referring Phys: Cristopher Peru  Sonographer Comments: Global longitudinal strain was attempted. IMPRESSIONS  1. Left ventricular ejection fraction, by estimation, is 50%. The left ventricle has mildly decreased function. The left ventricle demonstrates regional wall motion abnormalities with severe hypokinesis of the apical septal wall and true apex. There is mild concentric left ventricular hypertrophy. Left ventricular diastolic parameters are consistent with Grade I diastolic dysfunction (impaired relaxation).  2. Right ventricular systolic function is mildly reduced. The right ventricular size is normal. Tricuspid regurgitation signal is inadequate for assessing PA pressure.  3. Left atrial size was mildly dilated.  4. Right atrial size was mildly dilated.  5. The mitral valve is normal in structure. No evidence of mitral valve regurgitation. No evidence of mitral stenosis.  6. The  aortic valve is tricuspid. There is mild calcification of the aortic valve. Aortic valve regurgitation is not visualized. No aortic stenosis is present.  7. The inferior vena cava is normal in size with greater than 50% respiratory variability, suggesting right atrial pressure of 3 mmHg. FINDINGS  Left Ventricle:  Left ventricular ejection fraction, by estimation, is 50%. The left ventricle has mildly decreased function. The left ventricle demonstrates regional wall motion abnormalities. The left ventricular internal cavity size was normal in size. There is mild concentric left ventricular hypertrophy. Left ventricular diastolic parameters are consistent with Grade I diastolic dysfunction (impaired relaxation). Right Ventricle: The right ventricular size is normal. No increase in right ventricular wall thickness. Right ventricular systolic function is mildly reduced. Tricuspid regurgitation signal is inadequate for assessing PA pressure. Left Atrium: Left atrial size was mildly dilated. Right Atrium: Right atrial size was mildly dilated. Pericardium: There is no evidence of pericardial effusion. Mitral Valve: The mitral valve is normal in structure. There is mild calcification of the mitral valve leaflet(s). Mild mitral annular calcification. No evidence of mitral valve regurgitation. No evidence of mitral valve stenosis. Tricuspid Valve: The tricuspid valve is normal in structure. Tricuspid valve regurgitation is not demonstrated. Aortic Valve: The aortic valve is tricuspid. There is mild calcification of the aortic valve. Aortic valve regurgitation is not visualized. No aortic stenosis is present. Aortic valve mean gradient measures 5.0 mmHg. Aortic valve peak gradient measures 8.8 mmHg. Aortic valve area, by VTI measures 1.05 cm. Pulmonic Valve: The pulmonic valve was normal in structure. Pulmonic valve regurgitation is not visualized. Aorta: The aortic root is normal in size and structure. Venous: The inferior  vena cava is normal in size with greater than 50% respiratory variability, suggesting right atrial pressure of 3 mmHg. IAS/Shunts: No atrial level shunt detected by color flow Doppler.  LEFT VENTRICLE PLAX 2D LVIDd:         3.60 cm   Diastology LVIDs:         1.80 cm   LV e' medial:    9.36 cm/s LV PW:         1.20 cm   LV E/e' medial:  11.9 LV IVS:        1.30 cm   LV e' lateral:   12.50 cm/s LVOT diam:     1.90 cm   LV E/e' lateral: 8.9 LV SV:         26 LV SV Index:   12 LVOT Area:     2.84 cm  RIGHT VENTRICLE            IVC RV S prime:     6.96 cm/s  IVC diam: 1.70 cm TAPSE (M-mode): 1.8 cm LEFT ATRIUM           Index        RIGHT ATRIUM           Index LA diam:      2.50 cm 1.18 cm/m   RA Area:     18.30 cm LA Vol (A2C): 23.9 ml 11.24 ml/m  RA Volume:   49.90 ml  23.47 ml/m LA Vol (A4C): 21.9 ml 10.30 ml/m  AORTIC VALVE AV Area (Vmax):    1.07 cm AV Area (Vmean):   0.99 cm AV Area (VTI):     1.05 cm AV Vmax:           148.00 cm/s AV Vmean:          103.000 cm/s AV VTI:            0.244 m AV Peak Grad:      8.8 mmHg AV Mean Grad:      5.0 mmHg LVOT Vmax:         56.10 cm/s LVOT Vmean:  36.000 cm/s LVOT VTI:          0.090 m LVOT/AV VTI ratio: 0.37  AORTA Ao Root diam: 2.40 cm MITRAL VALVE MV Area (PHT): 5.02 cm     SHUNTS MV Decel Time: 151 msec     Systemic VTI:  0.09 m MV E velocity: 111.00 cm/s  Systemic Diam: 1.90 cm MV A velocity: 107.00 cm/s MV E/A ratio:  1.04 Dalton McleanMD Electronically signed by Wilfred Lacy Signature Date/Time: 03/26/2023/2:40:18 PM    Final    CT Head Wo Contrast  Result Date: 03/26/2023 CLINICAL DATA:  Provided history: Facial trauma, blunt. Ataxia, head trauma. Additional history provided: Fall. Patient subsequently became apneic/pulseless. EXAM: CT HEAD WITHOUT CONTRAST CT CERVICAL SPINE WITHOUT CONTRAST TECHNIQUE: Multidetector CT imaging of the head and cervical spine was performed following the standard protocol without intravenous contrast.  Multiplanar CT image reconstructions of the cervical spine were also generated. RADIATION DOSE REDUCTION: This exam was performed according to the departmental dose-optimization program which includes automated exposure control, adjustment of the mA and/or kV according to patient size and/or use of iterative reconstruction technique. COMPARISON:  Head CT 07/04/2021.  Cervical spine CT 07/04/2021. FINDINGS: CT HEAD FINDINGS Brain: Generalized parenchymal atrophy. 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 Background mild patchy and ill-defined hypoattenuation within the cerebral white matter, nonspecific but compatible with chronic small ischemic disease. There is no acute intracranial hemorrhage. No acute demarcated cortical infarct. No extra-axial fluid collection. No evidence of an intracranial mass. No midline shift. Vascular: No hyperdense vessel. Atherosclerotic calcifications. Skull: No calvarial fracture or aggressive osseous lesion. Sinuses/Orbits: No mass or acute finding within the imaged orbits. Small-volume secretions within the left sphenoid sinus. Mild mucosal thickening within the bilateral ethmoid sinuses. Other: Bilateral mastoid effusions. CT CERVICAL SPINE FINDINGS Alignment: Nonspecific straightening of the expected cervical lordosis. No significant spondylolisthesis. Skull base and vertebrae: The basion-dental and atlanto-dental intervals are maintained.No evidence of acute fracture to the cervical spine. Probable facet ankylosis at C7-T1 and T1-T2. Soft tissues and spinal canal: No prevertebral fluid or swelling. No visible canal hematoma. Disc levels: Cervical spondylosis with multilevel disc space narrowing, disc bulges/central disc protrusions, posterior disc osteophyte complexes and uncovertebral hypertrophy. Disc space narrowing is greatest at C5-C6 (moderate-to-advanced) and C6-C7 (advanced). No appreciable high-grade spinal canal stenosis.  Multilevel bony neural foraminal narrowing. Multilevel ventrolateral osteophytes. Upper chest: Separately reported on same day CT chest/abdomen/pelvis. Other: Enlarged heterogeneous thyroid gland. Mild enlargement of the right thyroid lobe also noted. IMPRESSION: CT head: 1.  No evidence of an acute intracranial abnormality. 2. 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 3. Background parenchymal atrophy and chronic small vessel ischemic disease. 4. Paranasal sinus disease and bilateral mastoid effusions (in the presence of life-support tubes). CT cervical spine: 1. No evidence of an acute cervical spine fracture. 2. Nonspecific straightening of the expected cervical lordosis. 3. Cervical spondylosis as described. 4. Heterogeneous and mildly enlarged thyroid gland. Given the patient's age, a non-emergent thyroid ultrasound may be obtained for further evaluation as clinically appropriate. Reference: J Am Coll Radiol. 2015 Feb;12(2): 143-50. Electronically Signed   By: Jackey Loge D.O.   On: 03/26/2023 13:58   CT Cervical Spine Wo Contrast  Result Date: 03/26/2023 CLINICAL DATA:  Provided history: Facial trauma, blunt. Ataxia, head trauma. Additional history provided: Fall. Patient subsequently became apneic/pulseless. EXAM: CT HEAD WITHOUT CONTRAST CT CERVICAL SPINE WITHOUT CONTRAST TECHNIQUE: Multidetector CT imaging of the head and cervical  spine was performed following the standard protocol without intravenous contrast. Multiplanar CT image reconstructions of the cervical spine were also generated. RADIATION DOSE REDUCTION: This exam was performed according to the departmental dose-optimization program which includes automated exposure control, adjustment of the mA and/or kV according to patient size and/or use of iterative reconstruction technique. COMPARISON:  Head CT 07/04/2021.  Cervical spine CT 07/04/2021. FINDINGS: CT HEAD FINDINGS Brain: Generalized parenchymal atrophy.  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 Background mild patchy and ill-defined hypoattenuation within the cerebral white matter, nonspecific but compatible with chronic small ischemic disease. There is no acute intracranial hemorrhage. No acute demarcated cortical infarct. No extra-axial fluid collection. No evidence of an intracranial mass. No midline shift. Vascular: No hyperdense vessel. Atherosclerotic calcifications. Skull: No calvarial fracture or aggressive osseous lesion. Sinuses/Orbits: No mass or acute finding within the imaged orbits. Small-volume secretions within the left sphenoid sinus. Mild mucosal thickening within the bilateral ethmoid sinuses. Other: Bilateral mastoid effusions. CT CERVICAL SPINE FINDINGS Alignment: Nonspecific straightening of the expected cervical lordosis. No significant spondylolisthesis. Skull base and vertebrae: The basion-dental and atlanto-dental intervals are maintained.No evidence of acute fracture to the cervical spine. Probable facet ankylosis at C7-T1 and T1-T2. Soft tissues and spinal canal: No prevertebral fluid or swelling. No visible canal hematoma. Disc levels: Cervical spondylosis with multilevel disc space narrowing, disc bulges/central disc protrusions, posterior disc osteophyte complexes and uncovertebral hypertrophy. Disc space narrowing is greatest at C5-C6 (moderate-to-advanced) and C6-C7 (advanced). No appreciable high-grade spinal canal stenosis. Multilevel bony neural foraminal narrowing. Multilevel ventrolateral osteophytes. Upper chest: Separately reported on same day CT chest/abdomen/pelvis. Other: Enlarged heterogeneous thyroid gland. Mild enlargement of the right thyroid lobe also noted. IMPRESSION: CT head: 1.  No evidence of an acute intracranial abnormality. 2. 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 3. Background parenchymal atrophy and chronic small vessel  ischemic disease. 4. Paranasal sinus disease and bilateral mastoid effusions (in the presence of life-support tubes). CT cervical spine: 1. No evidence of an acute cervical spine fracture. 2. Nonspecific straightening of the expected cervical lordosis. 3. Cervical spondylosis as described. 4. Heterogeneous and mildly enlarged thyroid gland. Given the patient's age, a non-emergent thyroid ultrasound may be obtained for further evaluation as clinically appropriate. Reference: J Am Coll Radiol. 2015 Feb;12(2): 143-50. Electronically Signed   By: Jackey Loge D.O.   On: 03/26/2023 13:58   DG Chest Portable 1 View  Result Date: 03/26/2023 CLINICAL DATA:  Post intubation. EXAM: PORTABLE CHEST 1 VIEW COMPARISON:  Radiographs 03/17/2023 and 07/04/2021. FINDINGS: 1200 hours. Tip of the endotracheal tube is approximately 1.5 cm above the carina. Enteric tube projects below the diaphragm, tip not visualized. The heart is enlarged, but stable. There is aortic atherosclerosis. New pulmonary edema with asymmetric right basilar airspace disease and a possible small right pleural effusion. No evidence of pneumothorax. No acute osseous findings are evident. External pacer and telemetry leads overlie the chest. IMPRESSION: 1. Tip of the endotracheal tube is just above the carina. Consider withdrawing 2-3 cm for more optimal positioning. 2. New pulmonary edema with asymmetric right basilar airspace disease and possible small right pleural effusion. Findings suggest congestive heart failure or aspiration. Electronically Signed   By: Carey Bullocks M.D.   On: 03/26/2023 13:30   DG Abdomen 1 View  Result Date: 03/26/2023 CLINICAL DATA:  OGT placement. EXAM: ABDOMEN - 1 VIEW COMPARISON:  None available. FINDINGS: Enteric tube tip projects over the distal stomach.  Mild gaseous distention of small-bowel loops in the pelvis. IMPRESSION: Enteric tube tip projects over the distal stomach. Mild gaseous distention of small-bowel loops  in the pelvis, nonspecific. Electronically Signed   By: Orvan Falconer M.D.   On: 03/26/2023 12:19     Patient Profile     77 year old female with past medical history of coronary artery disease with PCI of the right coronary artery, COPD, hypertension, hyperlipidemia, diabetes mellitus, dementia, chronic heart failure preserved ejection fraction for evaluation following presumed cardiac arrest. Most recent nuclear study November 2022 showed normal perfusion with ejection fraction 57%. Echocardiogram this admission shows ejection fraction 50% with apical hypokinesis, grade 1 diastolic dysfunction, mild RV dysfunction, mild biatrial enlargement.   Assessment & Plan    1 possible cardiac arrest-as outlined in previous note etiology of events unclear.  Based on reports from nursing facility she was found pulseless and apneic and required 5 minutes of CPR.  However at time of EMS arrival she was in sinus rhythm and there were no documented rhythm strips at time of arrest.  There was question of prolonged QTc at time of admission but based on my calculation QTc measured 0.46 (it appears that the P wave is included in that measurement).  Patient's troponin is mildly elevated but in the setting of CPR.  However echocardiogram shows apical wall motion abnormality and therefore cannot exclude possible ischemia mediated event.  Will continue aspirin and heparin.  It appears that her mental status is much better.  Will review with her when she is extubated and she will likely require cardiac catheterization.  She also has noted to have Mobitz 1 second-degree AV block on telemetry and therefore it is possible that event was bradycardia mediated though again no rhythm strips for documentation.   Would not resume beta-blockade.  May need outpatient monitor if she improves.  For now would follow on telemetry.   2 history of coronary artery disease-continue aspirin and resume statin.   3 respiratory failure with  aspiration-patient is being treated with antibiotics per CCM.  For extubation today.   4 hyperlipidemia-resume statin.   5 history of hypertension-blood pressure medications remain on hold as blood pressure is borderline and she was also hypotensive at time of admission.     6 elevated liver functions-likely secondary to shock liver from recent cardiac arrest.  Improving.  For questions or updates, please contact [facility] Please consult [email or web address] for contact info under        Signed, Olga Millers, MD  03/28/2023, 7:22 AM

## 2023-03-28 NOTE — Progress Notes (Signed)
PCCM: Resident Note   NAME:  Courtney [name], MRN:  [id number], DOB:  [birth year], LOS: 2 ADMISSION DATE:  03/26/2023, CONSULTATION DATE:  03/26/2023 REFERRING MD:  Dalene Seltzer, EDP, CHIEF COMPLAINT:  Cardiac arrest    History of Present Illness:  77 year old female with past medical history of asthma, COPD, diabetes, hyperlipidemia, hypertension, MI, CHF who presents to ED on 03/26/23 with cardiac arrest from facility. Per facility/EMS report, patient fell earlier today. She went back to her room and staff heard her talking. After she was quiet, they checked on her about 5 minutes later finding her apneic and pulseless. Per EDP, when fire department arrival which was about 5 minutes from call to 911, patient had ROSC/pulse. There was reported seizure-like activity at that time. Reported GCS was 3 at that time.    In the ED, GCS low, she was intubated for airway protection. Started on low dose propofol, peripheral levophed for hypotension after 1L IVF resuscitation with systolic in the 80s. Admitted to the ICU and was transitioned from propofol to versed on 11/25-11/26 for QT prolongation.  Cardiology was consulted and she was started on a heparin drip for suspected ACS with the echocardiogram changes: regional wall motion abnormalities with severe hypokinesis of the apical septal wall and apex.   Pertinent  Medical History  asthma, COPD, diabetes, hyperlipidemia, hypertension, MI, CHF  Significant Hospital Events: Including procedures, antibiotic start and stop dates in addition to other pertinent events   11/25: admit to PCCM for cardiac arrest  11/25: Levophed discontinued 11/26: EKG with new first-degree heart block with QTc prolongation greater than 500, PVCs, discontinued Flagyl, discontinued propofol, started Versed 11/26: Discontinued Versed, started Precedex 11/26: Heparin started for ACS, Mobitz Type 1 on EKG and telemetry  11/27: Precedex d/c   Interim History / Subjective:  Off of  Precedex, awake and alert  Review of Systems:   Unable to participate due to intubation  Objective   Blood pressure 114/79, pulse (!) 47, temperature (!) 97.5 F (36.4 C), resp. rate (!) 21, height 5\' 3"  (1.6 m), weight 88.5 kg, SpO2 100%. Tmax: 100.4     Vent Mode: PRVC FiO2 (%):  [40 %] 40 % Set Rate:  [16 bmp-20 bmp] 16 bmp Vt Set:  [420 mL] 420 mL PEEP:  [5 cmH20] 5 cmH20 Pressure Support:  [12 cmH20] 12 cmH20 Plateau Pressure:  [18 cmH20-25 cmH20] 22 cmH20   Intake/Output Summary (Last 24 hours) at 03/28/2023 0705 Last data filed at 03/28/2023 0600 Gross per 24 hour  Intake 1693.99 ml  Output 570 ml  Net 1123.99 ml   Filed Weights   03/26/23 1158 03/27/23 0500 03/28/23 0500  Weight: 113.4 kg 87.2 kg 88.5 kg    Examination: General: NAD, laying in bed  HEENT: ETT in place  Lungs: diminished breath sounds bibasilar, course inspiratory breath sounds  Cardiovascular: regular rate, Mobitz type 1 on telemetry  Abdomen: Soft, bowel sounds present, non-tender Extremities: warm extremities, moving all four extremities spontaneously   Neuro: Awake and alert this morning, following commands    Labs   CBC:    Latest Ref Rng & Units 03/28/2023    5:03 AM 03/27/2023   12:43 AM 03/26/2023    2:38 PM  CBC  WBC 4.0 - 10.5 K/uL 12.0  15.0    Hemoglobin 12.0 - 15.0 g/dL 8.1  8.6  82.9   Hematocrit 36.0 - 46.0 % 27.9  29.7  30.0   Platelets 150 - 400 K/uL 163  178  Basic Metabolic Panel:    Latest Ref Rng & Units 03/28/2023    5:03 AM 03/27/2023   11:10 PM 03/27/2023   11:28 AM  BMP  Glucose 70 - 99 mg/dL 914  782  956   BUN 8 - 23 mg/dL 23  23  18    Creatinine 0.44 - 1.00 mg/dL 2.13  0.86  5.78   Sodium 135 - 145 mmol/L 143  138  144   Potassium 3.5 - 5.1 mmol/L 3.6  3.8  3.6   Chloride 98 - 111 mmol/L 104  101  104   CO2 22 - 32 mmol/L 27  28  30    Calcium 8.9 - 10.3 mg/dL 8.3  8.2  8.8     GFR: Estimated Creatinine Clearance: 47.6 mL/min (A) (by C-G  formula based on SCr of 1.01 mg/dL (H)). Recent Labs  Lab 03/26/23 1135 03/26/23 1144 03/26/23 1426 03/26/23 1438 03/27/23 0043 03/28/23 0503  PROCALCITON  --   --  0.49  --   --   --   WBC 13.5*  --  15.1*  --  15.0* 12.0*  LATICACIDVEN  --  6.8*  --  2.7*  --   --     Liver Function Tests: Recent Labs  Lab 03/26/23 1136 03/27/23 0043 03/27/23 2310 03/28/23 0503  AST 704* 540*  --  99*  ALT 729* 555*  --  285*  ALKPHOS 167* 138*  --  105  BILITOT 0.7 0.7  --  0.6  PROT 6.9 6.1*  --  5.3*  ALBUMIN 3.3* 3.0*  3.0* 2.6* 2.5*  2.5*    ABG    Component Value Date/Time   PHART 7.481 (H) 03/26/2023 1348   PCO2ART 43.2 03/26/2023 1348   PO2ART 181 (H) 03/26/2023 1348   HCO3 33.2 (H) 03/26/2023 1438   TCO2 35 (H) 03/26/2023 1438   O2SAT 77 03/26/2023 1438    HbA1C: Hgb A1c MFr Bld  Date/Time Value Ref Range Status  03/26/2023 11:35 AM 6.1 (H) 4.8 - 5.6 % Final    Comment:    (NOTE) Pre diabetes:          5.7%-6.4%  Diabetes:              >6.4%  Glycemic control for   <7.0% adults with diabetes   07/04/2021 05:48 PM 6.6 (H) 4.8 - 5.6 % Final    Comment:    (NOTE) Pre diabetes:          5.7%-6.4%  Diabetes:              >6.4%  Glycemic control for   <7.0% adults with diabetes     CBG: Recent Labs  Lab 03/27/23 1937 03/27/23 2306 03/28/23 0322  GLUCAP 151* 108* 117*   Nasal MRSA + Respiratory Viral Panel: Not detected  Blood culture: NG Tracheal aspirate collected this morning   EEG: No seizures or epileptiform discharges seen, study suggestive of moderate to severe diffuse encephalopathy  Echocardiogram: LVEF 50%, left ventricle demonstrates regional wall motion abnormalities with severe hypokinesis of the apical septal wall and apex, grade 1 diastolic dysfunction -Right ventricular systolic function mildly reduced  CXR: Continued retrocardiac consolidation consistent with atelectasis or dense airspace disease, marked improvement in aeration  of the right upper lobe   BL LE US duplex: No evidence of DVT  Resolved Hospital Problem list     Assessment & Plan:  Cardiac arrest unclear etiology, suspected ACS, Qtc prolongation  Mobitz Type 1 Postarrest  encephalopathy  Patient is off precedex and is fully awake and alert. Her encephalopathy has improved.  Cardiology following, started heparin infusion for ACS concern  -Limit QT prolonging medications -Maintain potassium greater than 4, magnesium greater than 2 -Will continue to keep MAP> 65  -Extubated and doing well  -Possibly transfer out of unit today  -Will follow cardiology recommendations on catheterization   Respiratory failure with aspiration Continue ceftriaxone  Do not need to add Flagyl for anaerobic coverage per IDSA Tracheal aspiration today to help guide ABX  Extubated, will add diet after RN bed side swallow evaluation   Shock liver Improving, AST and ALT have down trended from 704/729--> 540/555 -->99/285 -HFP tomorrow -D/c statin   Type 2 diabetes mellitus A1c 6.1, decreased SSI to sensitive coverage  Hypertension Continue to hold Home HTN medications   HFpEF CAD Hyperlipidemia Continue Asprin 81 mg  D/c statin due to elevated AST/ALT at this time Continue to hold GDMT, consider restarting during hospitalization--> Do not resume Beta Blockers    Best Practice (right click and "Reselect all SmartList Selections" daily)   Diet/type: NPO DVT prophylaxis: Heparin 5,000 units subcutaneous TID GI prophylaxis: PPI Lines: Left subclavian  Foley:  Indwelling foley catheter  Code Status:  full code

## 2023-03-28 NOTE — Plan of Care (Signed)
  Problem: Nutritional: Goal: Maintenance of adequate nutrition will improve Outcome: Progressing   Problem: Skin Integrity: Goal: Risk for impaired skin integrity will decrease Outcome: Progressing   Problem: Activity: Goal: Risk for activity intolerance will decrease Outcome: Progressing   Problem: Nutrition: Goal: Adequate nutrition will be maintained Outcome: Progressing   Problem: Nutritional: Goal: Maintenance of adequate nutrition will improve Outcome: Progressing   Problem: Skin Integrity: Goal: Risk for impaired skin integrity will decrease Outcome: Progressing   Problem: Activity: Goal: Risk for activity intolerance will decrease Outcome: Progressing   Problem: Nutrition: Goal: Adequate nutrition will be maintained Outcome: Progressing   Problem: Nutritional: Goal: Maintenance of adequate nutrition will improve Outcome: Progressing   Problem: Skin Integrity: Goal: Risk for impaired skin integrity will decrease Outcome: Progressing   Problem: Activity: Goal: Risk for activity intolerance will decrease Outcome: Progressing   Problem: Nutrition: Goal: Adequate nutrition will be maintained Outcome: Progressing

## 2023-03-29 ENCOUNTER — Inpatient Hospital Stay (HOSPITAL_COMMUNITY): Payer: Medicare (Managed Care)

## 2023-03-29 DIAGNOSIS — R7989 Other specified abnormal findings of blood chemistry: Secondary | ICD-10-CM | POA: Diagnosis not present

## 2023-03-29 DIAGNOSIS — R4189 Other symptoms and signs involving cognitive functions and awareness: Secondary | ICD-10-CM | POA: Diagnosis not present

## 2023-03-29 DIAGNOSIS — I493 Ventricular premature depolarization: Secondary | ICD-10-CM | POA: Diagnosis not present

## 2023-03-29 DIAGNOSIS — I469 Cardiac arrest, cause unspecified: Secondary | ICD-10-CM | POA: Diagnosis not present

## 2023-03-29 DIAGNOSIS — R7401 Elevation of levels of liver transaminase levels: Secondary | ICD-10-CM | POA: Diagnosis not present

## 2023-03-29 LAB — CBC
HCT: 28.4 % — ABNORMAL LOW (ref 36.0–46.0)
Hemoglobin: 8.1 g/dL — ABNORMAL LOW (ref 12.0–15.0)
MCH: 24.7 pg — ABNORMAL LOW (ref 26.0–34.0)
MCHC: 28.5 g/dL — ABNORMAL LOW (ref 30.0–36.0)
MCV: 86.6 fL (ref 80.0–100.0)
Platelets: 179 10*3/uL (ref 150–400)
RBC: 3.28 MIL/uL — ABNORMAL LOW (ref 3.87–5.11)
RDW: 15.1 % (ref 11.5–15.5)
WBC: 13.1 10*3/uL — ABNORMAL HIGH (ref 4.0–10.5)
nRBC: 0 % (ref 0.0–0.2)

## 2023-03-29 LAB — GLUCOSE, CAPILLARY
Glucose-Capillary: 105 mg/dL — ABNORMAL HIGH (ref 70–99)
Glucose-Capillary: 118 mg/dL — ABNORMAL HIGH (ref 70–99)
Glucose-Capillary: 144 mg/dL — ABNORMAL HIGH (ref 70–99)
Glucose-Capillary: 157 mg/dL — ABNORMAL HIGH (ref 70–99)
Glucose-Capillary: 161 mg/dL — ABNORMAL HIGH (ref 70–99)
Glucose-Capillary: 173 mg/dL — ABNORMAL HIGH (ref 70–99)

## 2023-03-29 LAB — HEPATIC FUNCTION PANEL
ALT: 205 U/L — ABNORMAL HIGH (ref 0–44)
AST: 42 U/L — ABNORMAL HIGH (ref 15–41)
Albumin: 2.8 g/dL — ABNORMAL LOW (ref 3.5–5.0)
Alkaline Phosphatase: 117 U/L (ref 38–126)
Bilirubin, Direct: <0.1 mg/dL (ref 0.0–0.2)
Total Bilirubin: 0.6 mg/dL (ref <1.2)
Total Protein: 5.8 g/dL — ABNORMAL LOW (ref 6.5–8.1)

## 2023-03-29 LAB — RENAL FUNCTION PANEL
Albumin: 2.9 g/dL — ABNORMAL LOW (ref 3.5–5.0)
Anion gap: 6 (ref 5–15)
BUN: 22 mg/dL (ref 8–23)
CO2: 30 mmol/L (ref 22–32)
Calcium: 8.8 mg/dL — ABNORMAL LOW (ref 8.9–10.3)
Chloride: 104 mmol/L (ref 98–111)
Creatinine, Ser: 1.84 mg/dL — ABNORMAL HIGH (ref 0.44–1.00)
GFR, Estimated: 28 mL/min — ABNORMAL LOW (ref >60)
Glucose, Bld: 117 mg/dL — ABNORMAL HIGH (ref 70–99)
Phosphorus: 4.3 mg/dL (ref 2.5–4.6)
Potassium: 4.2 mmol/L (ref 3.5–5.1)
Sodium: 140 mmol/L (ref 135–145)

## 2023-03-29 LAB — MAGNESIUM: Magnesium: 2.5 mg/dL — ABNORMAL HIGH (ref 1.7–2.4)

## 2023-03-29 LAB — HEPARIN LEVEL (UNFRACTIONATED): Heparin Unfractionated: 0.33 [IU]/mL (ref 0.30–0.70)

## 2023-03-29 MED ORDER — IPRATROPIUM-ALBUTEROL 0.5-2.5 (3) MG/3ML IN SOLN
3.0000 mL | Freq: Three times a day (TID) | RESPIRATORY_TRACT | Status: DC
  Administered 2023-03-29: 3 mL via RESPIRATORY_TRACT
  Filled 2023-03-29: qty 3

## 2023-03-29 MED ORDER — ACETAMINOPHEN 325 MG PO TABS
650.0000 mg | ORAL_TABLET | Freq: Four times a day (QID) | ORAL | Status: DC | PRN
  Administered 2023-03-29 – 2023-03-30 (×2): 650 mg via ORAL
  Filled 2023-03-29 (×3): qty 2

## 2023-03-29 MED ORDER — GUAIFENESIN ER 600 MG PO TB12
600.0000 mg | ORAL_TABLET | Freq: Two times a day (BID) | ORAL | Status: DC
  Administered 2023-03-29 – 2023-04-03 (×10): 600 mg via ORAL
  Filled 2023-03-29 (×11): qty 1

## 2023-03-29 NOTE — Plan of Care (Signed)
  Problem: Coping: Goal: Ability to adjust to condition or change in health will improve Outcome: Progressing   Problem: Health Behavior/Discharge Planning: Goal: Ability to manage health-related needs will improve Outcome: Progressing   Problem: Nutritional: Goal: Maintenance of adequate nutrition will improve Outcome: Progressing   Problem: Skin Integrity: Goal: Risk for impaired skin integrity will decrease Outcome: Progressing   Problem: Education: Goal: Ability to manage disease process will improve Outcome: Progressing   Problem: Cardiac: Goal: Ability to achieve and maintain adequate cardiopulmonary perfusion will improve Outcome: Progressing   Problem: Neurologic: Goal: Promote progressive neurologic recovery Outcome: Progressing   Problem: Education: Goal: Knowledge of General Education information will improve Description: Including pain rating scale, medication(s)/side effects and non-pharmacologic comfort measures Outcome: Progressing   Problem: Clinical Measurements: Goal: Will remain free from infection Outcome: Progressing Goal: Diagnostic test results will improve Outcome: Progressing Goal: Respiratory complications will improve Outcome: Progressing

## 2023-03-29 NOTE — Progress Notes (Signed)
Rounding Note    Patient Name: Courtney [name] Date of Encounter: 03/29/2023  [facility] Cardiologist: Dietrich Pates MD   Patient Profile     77 year old female with past medical history of coronary artery disease with PCI of the right coronary artery, COPD, hypertension, hyperlipidemia, diabetes mellitus, dementia, chronic heart failure preserved ejection fraction evaluted following presumed cardiac arrest.Found pulseless and apneic at care facility >> CPR started -- Fire >> Bag and mask on arrival of EMS was in sinus with normal BP (140) and unresponsive cx by seizure activity ( had fall the same day)  Mg and phos "critically low" Described as MBZ1 2AVblock>> sinus with blocked PACS ( no heart block noted on ECGs)  Lactate 6.8 on arrival  supported with intubation and levophed for subsequent hypotension  Dx of aspiration pneumonia;  abnormal CXR Episodes of apnea noted by CCM 11.26   Most recent nuclear study November 2022 showed normal perfusion with ejection fraction 57%. Echocardiogram >> ejection fraction 50% with apical hypokinesis, grade 1 diastolic dysfunction, mild RV dysfunction, mild biatrial enlargement.   Subjective   Chronically short of breath but no different from her baseline.  Has asthma.  Says she has not walked in more than a year.  Inpatient Medications    Scheduled Meds:  arformoterol  15 mcg Nebulization BID   aspirin  81 mg Oral Daily   atorvastatin  80 mg Oral Daily   budesonide (PULMICORT) nebulizer solution  0.25 mg Nebulization BID   Chlorhexidine Gluconate Cloth  6 each Topical Daily   insulin aspart  0-9 Units Subcutaneous Q4H   melatonin  3 mg Oral QHS   mupirocin ointment  1 Application Nasal BID   mouth rinse  15 mL Mouth Rinse Q2H   revefenacin  175 mcg Nebulization Daily   thiamine  100 mg Oral Daily   Continuous Infusions:  cefTRIAXone (ROCEPHIN)  IV Stopped (03/28/23 1039)   heparin 950 Units/hr (03/28/23 1800)   PRN  Meds: acetaminophen, ipratropium-albuterol, mouth rinse, polyethylene glycol   Vital Signs    Vitals:   03/29/23 0200 03/29/23 0400 03/29/23 0500 03/29/23 0805  BP:  (!) 115/50  121/86  Pulse: (!) 111 (!) 108    Resp: (!) 25 (!) 25    Temp:    98.5 F (36.9 C)  TempSrc:    Oral  SpO2: 91% 96%    Weight:   102.8 kg   Height:        Intake/Output Summary (Last 24 hours) at 03/29/2023 1011 Last data filed at 03/28/2023 1800 Gross per 24 hour  Intake 175.46 ml  Output 600 ml  Net -424.54 ml      03/29/2023    5:00 AM 03/28/2023    5:00 AM 03/27/2023    5:00 AM  Last 3 Weights  Weight (lbs) 226 lb 10.1 oz 195 lb 1.7 oz 192 lb 3.9 oz  Weight (kg) 102.8 kg 88.5 kg 87.2 kg      Telemetry    Sinus with intermittent mobitz 1 AV block - Personally Reviewed  Physical Exam   BP 121/86 (BP Location: Left Arm)   Pulse (!) 108   Temp 98.5 F (36.9 C) (Oral)   Resp (!) 25   Ht 5\' 3"  (1.6 m)   Wt 102.8 kg   SpO2 96%   BMI 40.15 kg/m  Well developed and nourished in no acute distress HENT normal Neck supple  Egophony left greater than right Regular rate and rhythm,  no murmurs or gallops Abd-soft with active BS No Clubbing cyanosis edema Skin-warm and dry A & Oriented  Grossly normal sensory and motor function  ECG 11/27 sinus tach with frequent PVCs    Labs    High Sensitivity Troponin:   Recent Labs  Lab 03/17/23 2005 03/26/23 1135 03/26/23 1426 03/26/23 1829 03/28/23 1217  TROPONINIHS 20* 33* 113* 281* 176*     Chemistry Recent Labs  Lab 03/27/23 0043 03/27/23 1128 03/27/23 2310 03/28/23 0503 03/29/23 0355 03/29/23 0357  NA 143 144 138 143  --  140  K 3.0* 3.6 3.8 3.6  --  4.2  CL 104 104 101 104  --  104  CO2 30 30 28 27   --  30  GLUCOSE 117* 153* 112* 176*  --  117*  BUN 19 18 23 23   --  22  CREATININE 0.92 1.08* 1.01* 1.01*  --  1.84*  CALCIUM 9.1 8.8* 8.2* 8.3*  --  8.8*  MG 3.2* 2.9*  --  2.5* 2.5*  --   PROT 6.1*  --   --  5.3* 5.8*   --   ALBUMIN 3.0*  3.0*  --  2.6* 2.5*  2.5* 2.8* 2.9*  AST 540*  --   --  99* 42*  --   ALT 555*  --   --  285* 205*  --   ALKPHOS 138*  --   --  105 117  --   BILITOT 0.7  --   --  0.6 0.6  --   GFRNONAA >60 52* 57* 57*  --  28*  ANIONGAP 9 10 9 12   --  6    Lipids  Recent Labs  Lab 03/27/23 0043  TRIG 97    Hematology Recent Labs  Lab 03/27/23 0043 03/28/23 0503 03/29/23 0355  WBC 15.0* 12.0* 13.1*  RBC 3.51* 3.25* 3.28*  HGB 8.6* 8.1* 8.1*  HCT 29.7* 27.9* 28.4*  MCV 84.6 85.8 86.6  MCH 24.5* 24.9* 24.7*  MCHC 29.0* 29.0* 28.5*  RDW 14.0 14.7 15.1  PLT 178 163 179    BNP Recent Labs  Lab 03/26/23 1426  BNP 202.9*      Radiology    VAS [place] LOWER EXTREMITY VENOUS (DVT)  Result Date: 03/27/2023  Lower Venous DVT Study Patient Name:  Courtney [name]  Date of Exam:   03/27/2023 Medical Rec #: [id number]        Accession #:    [id number] Date of Birth: 1945/06/29         Patient Gender: F Patient Age:   80 years Exam Location:  [facility] Procedure:      VAS [place] LOWER EXTREMITY VENOUS (DVT) Referring Phys: Mertha Baars --------------------------------------------------------------------------------  Indications: Edema.  Risk Factors: None identified. Limitations: Poor ultrasound/tissue interface, body habitus and patient positioning. Comparison Study: No prior studies. Performing Technologist: Chanda Busing RVT  Examination Guidelines: A complete evaluation includes B-mode imaging, spectral Doppler, color Doppler, and power Doppler as needed of all accessible portions of each vessel. Bilateral testing is considered an integral part of a complete examination. Limited examinations for reoccurring indications may be performed as noted. The reflux portion of the exam is performed with the patient in reverse Trendelenburg.  +---------+---------------+---------+-----------+----------+--------------+ RIGHT    CompressibilityPhasicitySpontaneityPropertiesThrombus  Aging +---------+---------------+---------+-----------+----------+--------------+ CFV      Full           Yes      Yes                                 +---------+---------------+---------+-----------+----------+--------------+  SFJ      Full                                                        +---------+---------------+---------+-----------+----------+--------------+ FV Prox  Full                                                        +---------+---------------+---------+-----------+----------+--------------+ FV Mid   Full                                                        +---------+---------------+---------+-----------+----------+--------------+ FV DistalFull                                                        +---------+---------------+---------+-----------+----------+--------------+ PFV      Full                                                        +---------+---------------+---------+-----------+----------+--------------+ POP      Full           Yes      Yes                                 +---------+---------------+---------+-----------+----------+--------------+ PTV      Full                                                        +---------+---------------+---------+-----------+----------+--------------+ PERO     Full                                                        +---------+---------------+---------+-----------+----------+--------------+   +---------+---------------+---------+-----------+----------+--------------+ LEFT     CompressibilityPhasicitySpontaneityPropertiesThrombus Aging +---------+---------------+---------+-----------+----------+--------------+ CFV      Full           Yes      Yes                                 +---------+---------------+---------+-----------+----------+--------------+ SFJ      Full                                                         +---------+---------------+---------+-----------+----------+--------------+  FV Prox  Full                                                        +---------+---------------+---------+-----------+----------+--------------+ FV Mid   Full                                                        +---------+---------------+---------+-----------+----------+--------------+ FV DistalFull           Yes      Yes                                 +---------+---------------+---------+-----------+----------+--------------+ PFV      Full                                                        +---------+---------------+---------+-----------+----------+--------------+ POP      Full           Yes      Yes                                 +---------+---------------+---------+-----------+----------+--------------+ PTV      Full                                                        +---------+---------------+---------+-----------+----------+--------------+ PERO     Full                                                        +---------+---------------+---------+-----------+----------+--------------+     Summary: RIGHT: - There is no evidence of deep vein thrombosis in the lower extremity. However, portions of this examination were limited- see technologist comments above.  - No cystic structure found in the popliteal fossa.  LEFT: - There is no evidence of deep vein thrombosis in the lower extremity. However, portions of this examination were limited- see technologist comments above.  - No cystic structure found in the popliteal fossa.  *See table(s) above for measurements and observations. Electronically signed by Lemar Livings MD on 03/27/2023 at 2:29:22 PM.    Final      Assessment & Plan    Cardiorespiratory arrest mechanism unclear  Respiratory failure with aspiration pneumonia intubated now extubated  History of coronary disease with wall motion  abnormalities  Hypertension  Anemia-recurrent (hemoglobin 13.3/23)  Acute/chronic renal insufficiency  Estimated Creatinine Clearance: 28.4 mL/min (A) (by C-G formula based on SCr of 1.84 mg/dL (H)).  Grade 4  PVCs-frequent  I suspect her arrest was not cardiac but respiratory given a-respiratory status on arrival, b- chest  x-ray abnormalities c-the fact that her rhythm was normal and blood pressure was normal and she was unresponsive on the arrival of EMS I think it is reasonable to undertake catheterization given her wall motion abnormality although her troponin levels are relatively low and I am not particularly expectant that she would have severe coronary disease.  Will need to be deferred however as her creatinine has bumped.  Her lung exam is abnormal and she is quite short of breath.  I have reached out to hospitalist about respiratory therapy.  Her PVC burden is high.  Approximately 15%.  Could be responsible for some of her cardiomyopathy although typically it is a generalized myopathy and not segmental     For questions or updates, please contact [facility] Please consult [email or web address] for contact info under        Signed, Sherryl Manges, MD  03/29/2023, 10:11 AM

## 2023-03-29 NOTE — Evaluation (Signed)
Clinical/Bedside Swallow Evaluation Patient Details  Name: Courtney [name] MRN: [id number] Date of Birth: Aug 11, 1945  Today's Date: 03/29/2023 Time: SLP Start Time (ACUTE ONLY): 1106 SLP Stop Time (ACUTE ONLY): 1123 SLP Time Calculation (min) (ACUTE ONLY): 17 min  Past Medical History:  Past Medical History:  Diagnosis Date   Anemia    Arthritis    Asthma    CAD (coronary artery disease)    COPD (chronic obstructive pulmonary disease) (HCC)    Diabetes mellitus    Hyperlipidemia    Hypertension    Myocardial infarct (HCC)    Postmenopausal atrophic vaginitis    Restless leg syndrome    Past Surgical History:  Past Surgical History:  Procedure Laterality Date   BIOPSY  02/09/2021   Procedure: BIOPSY;  Surgeon: Imogene Burn, MD;  Location: [facility] ENDOSCOPY;  Service: Gastroenterology;;   CHOLECYSTECTOMY  1970's   ESOPHAGOGASTRODUODENOSCOPY (EGD) WITH PROPOFOL N/A 02/09/2021   Procedure: ESOPHAGOGASTRODUODENOSCOPY (EGD) WITH PROPOFOL;  Surgeon: Imogene Burn, MD;  Location: [facility] ENDOSCOPY;  Service: Gastroenterology;  Laterality: N/A;   INCISION AND DRAINAGE ABSCESS Right 06/27/2018   Procedure: 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.;  Surgeon: Romie Levee, MD;  Location: [facility] ORS;  Service: General;  Laterality: Right;   INCISION AND DRAINAGE PERIRECTAL ABSCESS N/A 06/25/2018   Procedure: IRRIGATION AND DEBRIDEMENT PERIRECTAL ABCESS;  Surgeon: Romie Levee, MD;  Location: [facility] ORS;  Service: General;  Laterality: N/A;   IRRIGATION AND DEBRIDEMENT ABSCESS N/A 07/01/2018   Procedure: IRRIGATION AND DEBRIDEMENT ABSCESS;  Surgeon: Emelia Loron, MD;  Location: [facility] ORS;  Service: General;  Laterality: N/A;   OTHER SURGICAL HISTORY  1970's   hysterectomy   WOUND DEBRIDEMENT N/A 07/03/2018   Procedure: DEBRIDEMENT PELVIS WITH DRESSING CHANGE;  Surgeon: Emelia Loron, MD;  Location: [facility] ORS;  Service: General;  Laterality: N/A;   HPI:  Courtney [name] is a 77 yo  female presenting to ED 11/25 after a cardiac arrest with <10 minute down time with noted seizure activity. ETT 11/25-11/27. CXR with L lower lobe and R upper lobe airspace disease. Previously seen by SLP 01/2021 with primarily oral dysphagia and was initially recommended a Dys 1 diet with nectar thick liquids before ultimately upgrading to a Dys 3 diet with thin liquids. MBS 02/07/21 with instances of aspiration before and during the swallow with thin liquids with esophageal backflow with moderate risk of postprandial aspiration. PMH includes asthma, COPD, DM, HLD, HTN, MI, CHF    Assessment / Plan / Recommendation  Clinical Impression  Pt reports continuing to eat a regular texture diet with thin liquids since d/c from ST services in 2022. Oral motor exam WFL. Observed pt with trials of thin liquids, purees, and solids with occasional delayed coughs which may be attributed to pt's reports of globus sensation and regurgitation. Note frequent eructation as well. Suspect pt's most significant risk for aspiration is postprandial due to probable esophageal factors given history. Recommend continuing regular diet with thin liquids and meds given whole with purees. No further f/u is needed. SLP to s/o at this time. SLP Visit Diagnosis: Dysphagia, unspecified (R13.10)    Aspiration Risk  Mild aspiration risk    Diet Recommendation Regular;Thin liquid    Liquid Administration via: Cup;Straw Medication Administration: Whole meds with puree Supervision: Staff to assist with self feeding;Full supervision/cueing for compensatory strategies Compensations: Minimize environmental distractions;Slow rate;Small sips/bites Postural Changes: Seated upright at 90 degrees;Remain upright for at least 30 minutes after po intake  Other  Recommendations Recommended Consults: Consider GI evaluation;Consider esophageal assessment Oral Care Recommendations: Oral care BID    Recommendations for follow up therapy are one  component of a multi-disciplinary discharge planning process, led by the attending physician.  Recommendations may be updated based on patient status, additional functional criteria and insurance authorization.  Follow up Recommendations No SLP follow up      Assistance Recommended at Discharge    Functional Status Assessment Patient has not had a recent decline in their functional status  Frequency and Duration            Prognosis Prognosis for improved oropharyngeal function: Good      Swallow Study   General HPI: Courtney [name] is a 77 yo female presenting to ED 11/25 after a cardiac arrest with <10 minute down time with noted seizure activity. ETT 11/25-11/27. CXR with L lower lobe and R upper lobe airspace disease. Previously seen by SLP 01/2021 with primarily oral dysphagia and was initially recommended a Dys 1 diet with nectar thick liquids before ultimately upgrading to a Dys 3 diet with thin liquids. MBS 02/07/21 with instances of aspiration before and during the swallow with thin liquids with esophageal backflow with moderate risk of postprandial aspiration. PMH includes asthma, COPD, DM, HLD, HTN, MI, CHF Type of Study: Bedside Swallow Evaluation Previous Swallow Assessment: see HPI Diet Prior to this Study: Regular;Thin liquids (Level 0) Temperature Spikes Noted: No Respiratory Status: Nasal cannula History of Recent Intubation: Yes Total duration of intubation (days): 3 days Date extubated: 03/28/23 Behavior/Cognition: Alert;Cooperative;Requires cueing Oral Cavity Assessment: Within Functional Limits Oral Care Completed by SLP: No Oral Cavity - Dentition: Adequate natural dentition Vision: Functional for self-feeding Self-Feeding Abilities: Needs assist Patient Positioning: Upright in bed Baseline Vocal Quality: Normal Volitional Cough: Strong Volitional Swallow: Able to elicit    Oral/Motor/Sensory Function Overall Oral Motor/Sensory Function: Within functional  limits   Ice Chips Ice chips: Not tested   Thin Liquid Thin Liquid: Impaired Presentation: Straw Pharyngeal  Phase Impairments: Cough - Delayed    Nectar Thick Nectar Thick Liquid: Not tested   Honey Thick Honey Thick Liquid: Not tested   Puree Puree: Within functional limits Presentation: Spoon   Solid     Solid: Within functional limits Presentation: Self Fed      Gwynneth Aliment, M.A., CF-SLP Speech Language Pathology, Acute Rehabilitation Services  Secure Chat preferred [phone]  03/29/2023,11:33 AM

## 2023-03-29 NOTE — Progress Notes (Signed)
PHARMACY - ANTICOAGULATION CONSULT NOTE  Pharmacy Consult for heparin gtt Indication: chest pain/ACS  Allergies  Allergen Reactions   Egg-Derived Products Swelling    Listed on [facility] 07/04/21 Patient reports had face swelling.    Penicillins Other (See Comments)    Patient Measurements: Height: 5\' 3"  (160 cm) Weight: 102.8 kg (226 lb 10.1 oz) IBW/kg (Calculated) : 52.4 Heparin Dosing Weight: 79.9 kg   Vital Signs: Temp: 98.5 F (36.9 C) (11/28 0805) Temp Source: Oral (11/28 0805) BP: 121/86 (11/28 0805) Pulse Rate: 108 (11/28 0400)  Labs: Recent Labs    03/26/23 1426 03/26/23 1438 03/26/23 1829 03/27/23 0043 03/27/23 1128 03/27/23 2310 03/28/23 0503 03/28/23 1217 03/29/23 0355 03/29/23 0357  HGB 9.1*   < >  --  8.6*  --   --  8.1*  --  8.1*  --   HCT 31.4*   < >  --  29.7*  --   --  27.9*  --  28.4*  --   PLT 182  --   --  178  --   --  163  --  179  --   HEPARINUNFRC  --   --   --   --   --  0.55 0.42  --  0.33  --   CREATININE 1.05*  --   --  0.92   < > 1.01* 1.01*  --   --  1.84*  TROPONINIHS 113*  --  281*  --   --   --   --  176*  --   --    < > = values in this interval not displayed.    Estimated Creatinine Clearance: 28.4 mL/min (A) (by C-G formula based on SCr of 1.84 mg/dL (H)).   Medical History: Past Medical History:  Diagnosis Date   Anemia    Arthritis    Asthma    CAD (coronary artery disease)    COPD (chronic obstructive pulmonary disease) (HCC)    Diabetes mellitus    Hyperlipidemia    Hypertension    Myocardial infarct (HCC)    Postmenopausal atrophic vaginitis    Restless leg syndrome     Assessment: 77 yo F with suspected ACS event leading to cardiac arrest. Pharmacy consulted for heparin gtt.   Hgb 8.1, Plt 179  Trop 113>281>176  HL 0.33 - therapeutic   Goal of Therapy:  Heparin level 0.3-0.7 units/ml Monitor platelets by anticoagulation protocol: Yes   Plan:  Increase heparin infusion to 1000 units/hr Daily HL,  CBC F/u s/sx bleeding and cardiology plans for possible cath   Thank you for allowing pharmacy to be a part of this patient's care.   Signe Colt, PharmD 03/29/2023 10:42 AM  **Pharmacist phone directory can be found on [email or web address] listed under [facility]**

## 2023-03-29 NOTE — Progress Notes (Signed)
PROGRESS NOTE        PATIENT DETAILS Name: Courtney [name] Age: 77 y.o. Sex: female Date of Birth: 14-Jul-1945 Admit Date: 03/26/2023 Admitting Physician Lorin Glass, MD [id number], Marcelino Freestone, MD  Brief Summary: Patient is a 78 y.o.  female with history of COPD, DM-2, HTN, HFpEF, CAD s/p PCI 2011-SNF resident-mostly bed to wheelchair bound (has not ambulated for a year)- brought in to the ED following a cardiac arrest.  She was intubated for airway protection-started on Levophed for hypotension-and then subsequently admitted to the ICU.  She was found to have wall motion abnormalities on echocardiogram, EKG showed QTc prolongation and Mobitz type I AV block-she was stabilized-extubated and transferred to [facility] on 11/28.    Significant events: 11/25>> admit to PCCM for cardiac arrest  11/27>> extubated 11/28>> transferred to [facility].  Significant studies: 11/25>> CXR: Asymmetric pulm edema/right basilar airspace disease. 11/25>> CT head: No acute intracranial abnormalities 11/25>> CT C-spine: No fracture. 11/25>> CT chest/abdomen/pelvis: Pulmonary edema, small right/moderate left pleural effusion 11/25>> echo: EF 50% severe hypokinesis of apical/septal wall. 11/26>> B/L lower extremity Doppler: No DVT  Significant microbiology data: 11/25>> respiratory virus panel: Negative 11/25>> blood culture: No growth 11/27>> tracheal aspirate: Pending  Procedures: 11/25-11/27>> ETT  Consults: PCCM Cardiology  Subjective: Lying comfortably in bed-denies any chest pain or shortness of breath.  Some intermittent shortness of breath-but appears comfortable.  Objective: Vitals: Blood pressure 121/86, pulse (!) 108, temperature 98.5 F (36.9 C), temperature source Oral, resp. rate (!) 25, height 5\' 3"  (1.6 m), weight 102.8 kg, SpO2 96%.   Exam: Gen Exam:Alert awake-not in any distress HEENT:atraumatic, normocephalic Chest: B/L clear to auscultation  anteriorly CVS:S1S2 regular Abdomen:soft non tender, non distended Extremities:no edema Neurology: Generalized weakness but nonfocal. Skin: no rash  Pertinent Labs/Radiology:    Latest Ref Rng & Units 03/29/2023    3:55 AM 03/28/2023    5:03 AM 03/27/2023   12:43 AM  CBC  WBC 4.0 - 10.5 K/uL 13.1  12.0  15.0   Hemoglobin 12.0 - 15.0 g/dL 8.1  8.1  8.6   Hematocrit 36.0 - 46.0 % 28.4  27.9  29.7   Platelets 150 - 400 K/uL 179  163  178     Lab Results  Component Value Date   NA 140 03/29/2023   K 4.2 03/29/2023   CL 104 03/29/2023   CO2 30 03/29/2023     Assessment/Plan: Cardiac arrest Reportedly found pulseless/apneic at SNF requiring 5 minutes of CPR. Unclear etiology at this point-has numerous wall motion abnormalities on echo-QTc slightly prolonged-Mobitz type I noted as well. Cardiology following-await further recommendations  Cardiogenic shock Required Levophed in the ICU-BP now stable Suspect this is due to cardiac arrest  Elevated troponins due to demand ischemia History of CAD with PCI in 2011-now with wall motion abnormality on echo Continue aspirin/statin/IV heparin Await cardiology recommendations for LHC Avoid beta-blocker due to Mobitz type I secondary AV block  Acute on chronic hypoxic respiratory failure secondary to cardiac arrest/aspiration pneumonia Acute hypoxia due to inability to protect airway in the setting of cardiac arrest and aspiration pneumonia Extubated 11/27-stable on 2-3 L-not on home O2  Aspiration pneumonia Afebrile Mild leukocytosis persists Cultures as above Continue Rocephin SLP eval  Shock liver LFTs improving Follow  AKI Jump in creatinine overnight-unclear etiology-but suspect hemodynamically mediated injury Frequent bladder scans  Renal ultrasound Avoid nephrotoxic agents Repeat labs in a.m.  Chronic HFpEF Volume status stable  HTN All antihypertensives currently on hold-BP stable  QTc  prolongation Telemetry monitoring Avoid QTc prolonging agents Keep K> 4, Mg> 2  Type I Mobitz AV block Telemetry monitoring Avoid beta-blocker/calcium channel blockers  HLD Statin-follow LFTs closely  DM-2 (A1c 6.1 on 11/25) CBG stable Continue to hold oral hypoglycemic agents SSI  Recent Labs    03/28/23 2358 03/29/23 0348 03/29/23 0807  GLUCAP 118* 105* 144*     COPD with chronic hypoxic respiratory failure on home O2 Stable Bronchodilators Continue O2  Mood disorder Relatively stable Holding Zoloft due to issues with possible QTc prolongation.  Dementia/cognitive dysfunction Delirium precautions  Heterogeneous and mildly enlarged thyroid gland Incidentally seen on CT C-spine Nonemergent thyroid ultrasound as an outpatient.  Debility/deconditioning Reportedly minimally ambulatory SNF resident PT/OT/SLP eval  Nutrition Status: Nutrition Problem: Inadequate oral intake Etiology: inability to eat Signs/Symptoms: NPO status Interventions: Refer to RD note for recommendations   Morbid Obesity: Estimated body mass index is 40.15 kg/m as calculated from the following:   Height as of this encounter: 5\' 3"  (1.6 m).   Weight as of this encounter: 102.8 kg.   Code status:   Code Status: Full Code   DVT Prophylaxis: SCDs Start: 03/26/23 1333 IV heparin  Family Communication: None at bedside   Disposition Plan: Status is: Inpatient Remains inpatient appropriate because: Severity of illness   Planned Discharge Destination:Skilled nursing facility   Diet: Diet Order             Diet heart healthy/carb modified Room service appropriate? Yes with Assist; Fluid consistency: Thin  Diet effective now                     Antimicrobial agents: Anti-infectives (From admission, onward)    Start     Dose/Rate Route Frequency Ordered Stop   03/27/23 1000  vancomycin (VANCOCIN) IVPB 1000 mg/200 mL premix  Status:  Discontinued        1,000 mg 200  mL/hr over 60 Minutes Intravenous Every 24 hours 03/26/23 1454 03/27/23 0850   03/27/23 1000  cefTRIAXone (ROCEPHIN) 2 g in sodium chloride 0.9 % 100 mL IVPB        2 g 200 mL/hr over 30 Minutes Intravenous Every 24 hours 03/27/23 0851     03/26/23 2200  ceFEPIme (MAXIPIME) 2 g in sodium chloride 0.9 % 100 mL IVPB  Status:  Discontinued        2 g 200 mL/hr over 30 Minutes Intravenous 2 times daily 03/26/23 1454 03/27/23 0850   03/26/23 2200  metroNIDAZOLE (FLAGYL) IVPB 500 mg  Status:  Discontinued        500 mg 100 mL/hr over 60 Minutes Intravenous Every 12 hours 03/26/23 1454 03/27/23 0046   03/26/23 1230  vancomycin (VANCOCIN) 2,250 mg in sodium chloride 0.9 % 500 mL IVPB        2,250 mg 261.3 mL/hr over 120 Minutes Intravenous  Once 03/26/23 1215 03/26/23 1708   03/26/23 1215  aztreonam (AZACTAM) 2 g in sodium chloride 0.9 % 100 mL IVPB  Status:  Discontinued        2 g 200 mL/hr over 30 Minutes Intravenous  Once 03/26/23 1212 03/26/23 1214   03/26/23 1215  metroNIDAZOLE (FLAGYL) IVPB 500 mg        500 mg 100 mL/hr over 60 Minutes Intravenous  Once 03/26/23 1212 03/26/23 1539   03/26/23 1215  vancomycin (VANCOCIN) IVPB 1000 mg/200 mL premix  Status:  Discontinued        1,000 mg 200 mL/hr over 60 Minutes Intravenous  Once 03/26/23 1212 03/26/23 1215   03/26/23 1215  ceFEPIme (MAXIPIME) 2 g in sodium chloride 0.9 % 100 mL IVPB        2 g 200 mL/hr over 30 Minutes Intravenous  Once 03/26/23 1214 03/26/23 1450        MEDICATIONS: Scheduled Meds:  arformoterol  15 mcg Nebulization BID   aspirin  81 mg Oral Daily   atorvastatin  80 mg Oral Daily   budesonide (PULMICORT) nebulizer solution  0.25 mg Nebulization BID   Chlorhexidine Gluconate Cloth  6 each Topical Daily   insulin aspart  0-9 Units Subcutaneous Q4H   melatonin  3 mg Oral QHS   mupirocin ointment  1 Application Nasal BID   mouth rinse  15 mL Mouth Rinse Q2H   revefenacin  175 mcg Nebulization Daily   thiamine   100 mg Oral Daily   Continuous Infusions:  cefTRIAXone (ROCEPHIN)  IV Stopped (03/28/23 1039)   heparin 950 Units/hr (03/28/23 1800)   PRN Meds:.acetaminophen, ipratropium-albuterol, mouth rinse, polyethylene glycol   I have personally reviewed following labs and imaging studies  LABORATORY DATA: CBC: Recent Labs  Lab 03/26/23 1135 03/26/23 1144 03/26/23 1426 03/26/23 1438 03/27/23 0043 03/28/23 0503 03/29/23 0355  WBC 13.5*  --  15.1*  --  15.0* 12.0* 13.1*  HGB 9.9*   < > 9.1* 10.2* 8.6* 8.1* 8.1*  HCT 36.2   < > 31.4* 30.0* 29.7* 27.9* 28.4*  MCV 90.0  --  87.5  --  84.6 85.8 86.6  PLT 262  --  182  --  178 163 179   < > = values in this interval not displayed.    Basic Metabolic Panel: Recent Labs  Lab 03/26/23 1426 03/26/23 1438 03/27/23 0043 03/27/23 1128 03/27/23 2310 03/28/23 0503 03/29/23 0355 03/29/23 0357  NA  --    < > 143 144 138 143  --  140  K  --    < > 3.0* 3.6 3.8 3.6  --  4.2  CL  --   --  104 104 101 104  --  104  CO2  --   --  30 30 28 27   --  30  GLUCOSE  --   --  117* 153* 112* 176*  --  117*  BUN  --   --  19 18 23 23   --  22  CREATININE 1.05*  --  0.92 1.08* 1.01* 1.01*  --  1.84*  CALCIUM  --   --  9.1 8.8* 8.2* 8.3*  --  8.8*  MG 1.1*  --  3.2* 2.9*  --  2.5* 2.5*  --   PHOS 3.2  --  <1.0*  <1.0*  --  6.0* 5.3*  --  4.3   < > = values in this interval not displayed.    GFR: Estimated Creatinine Clearance: 28.4 mL/min (A) (by C-G formula based on SCr of 1.84 mg/dL (H)).  Liver Function Tests: Recent Labs  Lab 03/26/23 1136 03/27/23 0043 03/27/23 2310 03/28/23 0503 03/29/23 0355 03/29/23 0357  AST 704* 540*  --  99* 42*  --   ALT 729* 555*  --  285* 205*  --   ALKPHOS 167* 138*  --  105 117  --   BILITOT 0.7 0.7  --  0.6 0.6  --  PROT 6.9 6.1*  --  5.3* 5.8*  --   ALBUMIN 3.3* 3.0*  3.0* 2.6* 2.5*  2.5* 2.8* 2.9*   No results for input(s): "LIPASE", "AMYLASE" in the last 168 hours. No results for input(s):  "AMMONIA" in the last 168 hours.  Coagulation Profile: No results for input(s): "INR", "PROTIME" in the last 168 hours.  Cardiac Enzymes: No results for input(s): "CKTOTAL", "CKMB", "CKMBINDEX", "TROPONINI" in the last 168 hours.  BNP (last 3 results) No results for input(s): "PROBNP" in the last 8760 hours.  Lipid Profile: Recent Labs    03/27/23 0043  TRIG 97    Thyroid Function Tests: No results for input(s): "TSH", "T4TOTAL", "FREET4", "T3FREE", "THYROIDAB" in the last 72 hours.  Anemia Panel: No results for input(s): "VITAMINB12", "FOLATE", "FERRITIN", "TIBC", "IRON", "RETICCTPCT" in the last 72 hours.  Urine analysis:    Component Value Date/Time   COLORURINE YELLOW 03/26/2023 1426   APPEARANCEUR HAZY (A) 03/26/2023 1426   LABSPEC 1.014 03/26/2023 1426   PHURINE 5.0 03/26/2023 1426   GLUCOSEU NEGATIVE 03/26/2023 1426   HGBUR SMALL (A) 03/26/2023 1426   BILIRUBINUR NEGATIVE 03/26/2023 1426   KETONESUR NEGATIVE 03/26/2023 1426   PROTEINUR 100 (A) 03/26/2023 1426   UROBILINOGEN 1.0 02/11/2010 1320   NITRITE NEGATIVE 03/26/2023 1426   LEUKOCYTESUR NEGATIVE 03/26/2023 1426    Sepsis Labs: Lactic Acid, Venous    Component Value Date/Time   LATICACIDVEN 2.7 (HH) 03/26/2023 1438    MICROBIOLOGY: Recent Results (from the past 240 hour(s))  MRSA Next Gen by PCR, Nasal     Status: Abnormal   Collection Time: 03/26/23  1:36 PM   Specimen: Nasal Mucosa; Nasal Swab  Result Value Ref Range Status   MRSA by PCR Next Gen DETECTED (A) NOT DETECTED Final    Comment: RESULT CALLED TO, READ BACK BY AND VERIFIED WITH: RN Ronald Lobo [phone] @ 1801 FH` (NOTE) The GeneXpert MRSA Assay (FDA approved for NASAL specimens only), is one component of a comprehensive MRSA colonization surveillance program. It is not intended to diagnose MRSA infection nor to guide or monitor treatment for MRSA infections. Test performance is not FDA approved in patients less than 40  years old. Performed at [facility], [address] [place], [place] [address]   Respiratory (~20 pathogens) panel by PCR     Status: None   Collection Time: 03/26/23  1:56 PM   Specimen: Nasopharyngeal Swab; Respiratory  Result Value Ref Range Status   Adenovirus NOT DETECTED NOT DETECTED Final   Coronavirus 229E NOT DETECTED NOT DETECTED Final    Comment: (NOTE) The Coronavirus on the Respiratory Panel, DOES NOT test for the novel  Coronavirus (2019 nCoV)    Coronavirus HKU1 NOT DETECTED NOT DETECTED Final   Coronavirus NL63 NOT DETECTED NOT DETECTED Final   Coronavirus OC43 NOT DETECTED NOT DETECTED Final   Metapneumovirus NOT DETECTED NOT DETECTED Final   Rhinovirus / Enterovirus NOT DETECTED NOT DETECTED Final   Influenza A NOT DETECTED NOT DETECTED Final   Influenza B NOT DETECTED NOT DETECTED Final   Parainfluenza Virus 1 NOT DETECTED NOT DETECTED Final   Parainfluenza Virus 2 NOT DETECTED NOT DETECTED Final   Parainfluenza Virus 3 NOT DETECTED NOT DETECTED Final   Parainfluenza Virus 4 NOT DETECTED NOT DETECTED Final   Respiratory Syncytial Virus NOT DETECTED NOT DETECTED Final   Bordetella pertussis NOT DETECTED NOT DETECTED Final   Bordetella Parapertussis NOT DETECTED NOT DETECTED Final   Chlamydophila pneumoniae NOT DETECTED  NOT DETECTED Final   Mycoplasma pneumoniae NOT DETECTED NOT DETECTED Final    Comment: Performed at [facility], [address] [place], [place] [address]  Culture, blood (single)     Status: None (Preliminary result)   Collection Time: 03/26/23  2:29 PM   Specimen: BLOOD  Result Value Ref Range Status   Specimen Description BLOOD SITE NOT SPECIFIED  Final   Special Requests   Final    BOTTLES DRAWN AEROBIC AND ANAEROBIC Blood Culture adequate volume   Culture   Final    NO GROWTH 3 DAYS Performed at [facility], [address], [place], [place] [address]    Report Status PENDING  Incomplete  Culture, Respiratory w  Gram Stain     Status: None (Preliminary result)   Collection Time: 03/28/23  7:49 AM   Specimen: Tracheal Aspirate; Respiratory  Result Value Ref Range Status   Specimen Description TRACHEAL ASPIRATE  Final   Special Requests NONE  Final   Gram Stain   Final    ABUNDANT WBC PRESENT, PREDOMINANTLY PMN RARE GRAM POSITIVE COCCI RARE YEAST Performed at [facility], [address] [place], [place] [address]    Culture PENDING  Incomplete   Report Status PENDING  Incomplete    RADIOLOGY STUDIES/RESULTS: VAS [place] LOWER EXTREMITY VENOUS (DVT)  Result Date: 03/27/2023  Lower Venous DVT Study Patient Name:  ALGERIA LEEK  Date of Exam:   03/27/2023 Medical Rec #: [id number]        Accession #:    [id number] Date of Birth: 15-Aug-1945         Patient Gender: F Patient Age:   46 years Exam Location:  [facility] Procedure:      VAS [place] LOWER EXTREMITY VENOUS (DVT) Referring Phys: Mertha Baars --------------------------------------------------------------------------------  Indications: Edema.  Risk Factors: None identified. Limitations: Poor ultrasound/tissue interface, body habitus and patient positioning. Comparison Study: No prior studies. Performing Technologist: Chanda Busing RVT  Examination Guidelines: A complete evaluation includes B-mode imaging, spectral Doppler, color Doppler, and power Doppler as needed of all accessible portions of each vessel. Bilateral testing is considered an integral part of a complete examination. Limited examinations for reoccurring indications may be performed as noted. The reflux portion of the exam is performed with the patient in reverse Trendelenburg.  +---------+---------------+---------+-----------+----------+--------------+ RIGHT    CompressibilityPhasicitySpontaneityPropertiesThrombus Aging +---------+---------------+---------+-----------+----------+--------------+ CFV      Full           Yes      Yes                                  +---------+---------------+---------+-----------+----------+--------------+ SFJ      Full                                                        +---------+---------------+---------+-----------+----------+--------------+ FV Prox  Full                                                        +---------+---------------+---------+-----------+----------+--------------+ FV Mid   Full                                                        +---------+---------------+---------+-----------+----------+--------------+  FV DistalFull                                                        +---------+---------------+---------+-----------+----------+--------------+ PFV      Full                                                        +---------+---------------+---------+-----------+----------+--------------+ POP      Full           Yes      Yes                                 +---------+---------------+---------+-----------+----------+--------------+ PTV      Full                                                        +---------+---------------+---------+-----------+----------+--------------+ PERO     Full                                                        +---------+---------------+---------+-----------+----------+--------------+   +---------+---------------+---------+-----------+----------+--------------+ LEFT     CompressibilityPhasicitySpontaneityPropertiesThrombus Aging +---------+---------------+---------+-----------+----------+--------------+ CFV      Full           Yes      Yes                                 +---------+---------------+---------+-----------+----------+--------------+ SFJ      Full                                                        +---------+---------------+---------+-----------+----------+--------------+ FV Prox  Full                                                         +---------+---------------+---------+-----------+----------+--------------+ FV Mid   Full                                                        +---------+---------------+---------+-----------+----------+--------------+ FV DistalFull           Yes      Yes                                 +---------+---------------+---------+-----------+----------+--------------+  PFV      Full                                                        +---------+---------------+---------+-----------+----------+--------------+ POP      Full           Yes      Yes                                 +---------+---------------+---------+-----------+----------+--------------+ PTV      Full                                                        +---------+---------------+---------+-----------+----------+--------------+ PERO     Full                                                        +---------+---------------+---------+-----------+----------+--------------+     Summary: RIGHT: - There is no evidence of deep vein thrombosis in the lower extremity. However, portions of this examination were limited- see technologist comments above.  - No cystic structure found in the popliteal fossa.  LEFT: - There is no evidence of deep vein thrombosis in the lower extremity. However, portions of this examination were limited- see technologist comments above.  - No cystic structure found in the popliteal fossa.  *See table(s) above for measurements and observations. Electronically signed by Lemar Livings MD on 03/27/2023 at 2:29:22 PM.    Final      LOS: 3 days   Jeoffrey Massed, MD  [facility]    To contact the attending provider between 7A-7P or the covering provider during after hours 7P-7A, please log into the web site [email or web address] and access using universal [place] password for that web site. If you do not have the password, please call the hospital operator.  03/29/2023, 8:58 AM

## 2023-03-29 NOTE — Plan of Care (Signed)
  Problem: Education: Goal: Ability to describe self-care measures that may prevent or decrease complications (Diabetes Survival Skills Education) will improve Outcome: Progressing   Problem: Fluid Volume: Goal: Ability to maintain a balanced intake and output will improve Outcome: Progressing   Problem: Health Behavior/Discharge Planning: Goal: Ability to identify and utilize available resources and services will improve Outcome: Progressing Goal: Ability to manage health-related needs will improve Outcome: Progressing   Problem: Metabolic: Goal: Ability to maintain appropriate glucose levels will improve Outcome: Progressing   Problem: Nutritional: Goal: Maintenance of adequate nutrition will improve Outcome: Progressing   Problem: Tissue Perfusion: Goal: Adequacy of tissue perfusion will improve Outcome: Progressing   Problem: Cardiac: Goal: Ability to achieve and maintain adequate cardiopulmonary perfusion will improve Outcome: Progressing   Problem: Skin Integrity: Goal: Risk for impaired skin integrity will be minimized. Outcome: Progressing   Problem: Education: Goal: Knowledge of General Education information will improve Description: Including pain rating scale, medication(s)/side effects and non-pharmacologic comfort measures Outcome: Progressing   Problem: Clinical Measurements: Goal: Will remain free from infection Outcome: Progressing Goal: Diagnostic test results will improve Outcome: Progressing Goal: Respiratory complications will improve Outcome: Progressing Goal: Cardiovascular complication will be avoided Outcome: Progressing   Problem: Activity: Goal: Risk for activity intolerance will decrease Outcome: Progressing   Problem: Elimination: Goal: Will not experience complications related to urinary retention Outcome: Progressing   Problem: Pain Management: Goal: General experience of comfort will improve Outcome: Progressing

## 2023-03-30 DIAGNOSIS — N179 Acute kidney failure, unspecified: Secondary | ICD-10-CM

## 2023-03-30 DIAGNOSIS — R4189 Other symptoms and signs involving cognitive functions and awareness: Secondary | ICD-10-CM | POA: Diagnosis not present

## 2023-03-30 DIAGNOSIS — I469 Cardiac arrest, cause unspecified: Secondary | ICD-10-CM | POA: Diagnosis not present

## 2023-03-30 DIAGNOSIS — R7989 Other specified abnormal findings of blood chemistry: Secondary | ICD-10-CM | POA: Diagnosis not present

## 2023-03-30 DIAGNOSIS — R7401 Elevation of levels of liver transaminase levels: Secondary | ICD-10-CM | POA: Diagnosis not present

## 2023-03-30 LAB — RENAL FUNCTION PANEL
Albumin: 3 g/dL — ABNORMAL LOW (ref 3.5–5.0)
Anion gap: 10 (ref 5–15)
BUN: 20 mg/dL (ref 8–23)
CO2: 25 mmol/L (ref 22–32)
Calcium: 8.9 mg/dL (ref 8.9–10.3)
Chloride: 108 mmol/L (ref 98–111)
Creatinine, Ser: 1.37 mg/dL — ABNORMAL HIGH (ref 0.44–1.00)
GFR, Estimated: 39 mL/min — ABNORMAL LOW (ref >60)
Glucose, Bld: 124 mg/dL — ABNORMAL HIGH (ref 70–99)
Phosphorus: 3.7 mg/dL (ref 2.5–4.6)
Potassium: 4.4 mmol/L (ref 3.5–5.1)
Sodium: 143 mmol/L (ref 135–145)

## 2023-03-30 LAB — HEPATIC FUNCTION PANEL
ALT: 147 U/L — ABNORMAL HIGH (ref 0–44)
AST: 30 U/L (ref 15–41)
Albumin: 3 g/dL — ABNORMAL LOW (ref 3.5–5.0)
Alkaline Phosphatase: 120 U/L (ref 38–126)
Bilirubin, Direct: 0.1 mg/dL (ref 0.0–0.2)
Indirect Bilirubin: 0.5 mg/dL (ref 0.3–0.9)
Total Bilirubin: 0.6 mg/dL (ref <1.2)
Total Protein: 6.3 g/dL — ABNORMAL LOW (ref 6.5–8.1)

## 2023-03-30 LAB — GLUCOSE, CAPILLARY
Glucose-Capillary: 108 mg/dL — ABNORMAL HIGH (ref 70–99)
Glucose-Capillary: 126 mg/dL — ABNORMAL HIGH (ref 70–99)
Glucose-Capillary: 129 mg/dL — ABNORMAL HIGH (ref 70–99)
Glucose-Capillary: 143 mg/dL — ABNORMAL HIGH (ref 70–99)
Glucose-Capillary: 149 mg/dL — ABNORMAL HIGH (ref 70–99)
Glucose-Capillary: 168 mg/dL — ABNORMAL HIGH (ref 70–99)

## 2023-03-30 LAB — LEGIONELLA PNEUMOPHILA SEROGP 1 UR AG: L. pneumophila Serogp 1 Ur Ag: NEGATIVE

## 2023-03-30 LAB — CBC
HCT: 29.6 % — ABNORMAL LOW (ref 36.0–46.0)
Hemoglobin: 8.3 g/dL — ABNORMAL LOW (ref 12.0–15.0)
MCH: 25.3 pg — ABNORMAL LOW (ref 26.0–34.0)
MCHC: 28 g/dL — ABNORMAL LOW (ref 30.0–36.0)
MCV: 90.2 fL (ref 80.0–100.0)
Platelets: 157 10*3/uL (ref 150–400)
RBC: 3.28 MIL/uL — ABNORMAL LOW (ref 3.87–5.11)
RDW: 15 % (ref 11.5–15.5)
WBC: 10.6 10*3/uL — ABNORMAL HIGH (ref 4.0–10.5)
nRBC: 0 % (ref 0.0–0.2)

## 2023-03-30 LAB — CK: Total CK: 56 U/L (ref 38–234)

## 2023-03-30 LAB — HEPARIN LEVEL (UNFRACTIONATED): Heparin Unfractionated: 0.32 [IU]/mL (ref 0.30–0.70)

## 2023-03-30 LAB — MAGNESIUM: Magnesium: 2.4 mg/dL (ref 1.7–2.4)

## 2023-03-30 MED ORDER — ENSURE ENLIVE PO LIQD
237.0000 mL | Freq: Two times a day (BID) | ORAL | Status: DC
  Administered 2023-03-30 – 2023-04-03 (×7): 237 mL via ORAL

## 2023-03-30 MED ORDER — TRAZODONE HCL 50 MG PO TABS
50.0000 mg | ORAL_TABLET | Freq: Once | ORAL | Status: DC
  Filled 2023-03-30: qty 1

## 2023-03-30 MED ORDER — HEPARIN SODIUM (PORCINE) 5000 UNIT/ML IJ SOLN
5000.0000 [IU] | Freq: Three times a day (TID) | INTRAMUSCULAR | Status: DC
  Administered 2023-03-30 – 2023-04-03 (×10): 5000 [IU] via SUBCUTANEOUS
  Filled 2023-03-30 (×11): qty 1

## 2023-03-30 MED ORDER — AMLODIPINE BESYLATE 5 MG PO TABS
5.0000 mg | ORAL_TABLET | Freq: Every day | ORAL | Status: DC
  Administered 2023-03-30 – 2023-04-01 (×3): 5 mg via ORAL
  Filled 2023-03-30 (×3): qty 1

## 2023-03-30 MED ORDER — IPRATROPIUM-ALBUTEROL 0.5-2.5 (3) MG/3ML IN SOLN
3.0000 mL | Freq: Four times a day (QID) | RESPIRATORY_TRACT | Status: DC
  Administered 2023-03-30 – 2023-04-02 (×11): 3 mL via RESPIRATORY_TRACT
  Filled 2023-03-30 (×12): qty 3

## 2023-03-30 NOTE — Progress Notes (Signed)
PT Cancellation Note  Patient Details Name: Courtney [name] MRN: [id number] DOB: 1946/04/17   Cancelled Treatment:    Reason Eval/Treat Not Completed: PT screened, no needs identified, will sign off (Per chart review, Pt is bedbound at baseline and has not ambulated in over a year. No acute PT needs identified. Will sign off)   Gladys Damme 03/30/2023, 8:19 AM

## 2023-03-30 NOTE — Progress Notes (Addendum)
PROGRESS NOTE        PATIENT DETAILS Name: QUARTNEY WINKLEPLECK Age: 77 y.o. Sex: female Date of Birth: 1945/09/18 Admit Date: 03/26/2023 Admitting Physician Lorin Glass, MD VHQ:[id number], Marcelino Freestone, MD  Brief Summary: Patient is a 77 y.o.  female with history of COPD, DM-2, HTN, HFpEF, CAD s/p PCI 2011-SNF resident-mostly bed to wheelchair bound (has not ambulated for a year)- brought in to the ED following a cardiac arrest.  She was intubated for airway protection-started on Levophed for hypotension-and then subsequently admitted to the ICU.  She was found to have wall motion abnormalities on echocardiogram, EKG showed QTc prolongation and Mobitz type I AV block-she was stabilized-extubated and transferred to [facility] on 11/28.    Significant events: 11/25>> admit to PCCM for cardiac arrest  11/27>> extubated 11/28>> transferred to [facility].  Significant studies: 11/25>> CXR: Asymmetric pulm edema/right basilar airspace disease. 11/25>> CT head: No acute intracranial abnormalities 11/25>> CT C-spine: No fracture. 11/25>> CT chest/abdomen/pelvis: Pulmonary edema, small right/moderate left pleural effusion 11/25>> echo: EF 50% severe hypokinesis of apical/septal wall. 11/26>> B/L lower extremity Doppler: No DVT 11/28>> renal ultrasound: No hydronephrosis  Significant microbiology data: 11/25>> respiratory virus panel: Negative 11/25>> blood culture: No growth 11/27>> tracheal aspirate: Pending  Procedures: 11/25-11/27>> ETT  Consults: PCCM Cardiology  Subjective: No complaints-breathing at baseline this morning.  Objective: Vitals: Blood pressure (!) 154/78, pulse 98, temperature 98.2 F (36.8 C), temperature source Oral, resp. rate 19, height 5\' 3"  (1.6 m), weight 92.6 kg, SpO2 96%.   Exam: Gen Exam:Alert awake-not in any distress HEENT:atraumatic, normocephalic Chest: Some scattered wheezing but moving air well-markedly better than yesterday. CVS:S1S2  regular Abdomen:soft non tender, non distended Extremities:no edema Neurology: Non focal-generalized weakness Skin: no rash  Pertinent Labs/Radiology:    Latest Ref Rng & Units 03/30/2023    4:26 AM 03/29/2023    3:55 AM 03/28/2023    5:03 AM  CBC  WBC 4.0 - 10.5 K/uL 10.6  13.1  12.0   Hemoglobin 12.0 - 15.0 g/dL 8.3  8.1  8.1   Hematocrit 36.0 - 46.0 % 29.6  28.4  27.9   Platelets 150 - 400 K/uL 157  179  163     Lab Results  Component Value Date   NA 143 03/30/2023   K 4.4 03/30/2023   CL 108 03/30/2023   CO2 25 03/30/2023     Assessment/Plan: Cardiac arrest Reportedly found pulseless/apneic at SNF requiring 5 minutes of CPR. Unclear etiology at this point-has numerous wall motion abnormalities on echo-QTc slightly prolonged-Mobitz type I noted as well. Given her poor overall health-bedbound status-cardiology recommending conservative measures at this point.  Cardiogenic shock Required Levophed in the ICU-BP now stable Suspect this is due to cardiac arrest  Elevated troponins due to demand ischemia History of CAD with PCI in 2011-now with wall motion abnormality on echo Continue aspirin/statin IV heparin discontinued 11/29 Cardiology plans to pursue conservative measures-no plans for [facility] Avoid beta-blocker due to Mobitz type I secondary AV block  Acute on chronic hypoxic respiratory failure secondary to cardiac arrest/aspiration pneumonia Acute hypoxia due to inability to protect airway in the setting of cardiac arrest and aspiration pneumonia Extubated 11/27-stable on 2-3 L-not on home O2 Mobilize as much as possible PT/OT eval Pulmonary toileting.  Aspiration pneumonia Afebrile Leukocytosis downtrending Follow tracheal aspirate cultures-pending Continue Rocephin x 5  days total SLP eval-regular diet recommended.  Shock liver LFTs improving Follow  AKI Likely hemodynamically mediated kidney injury Creatinine downtrending Supportive care Avoid  nephrotoxic agents Repeat labs in AM.  Chronic HFpEF Volume status stable  HTN All antihypertensives currently on hold-BP stable  QTc prolongation Telemetry monitoring Avoid QTc prolonging agents Keep K> 4, Mg> 2  Type I Mobitz AV block Telemetry monitoring Avoid beta-blocker/calcium channel blockers  HLD Statin-follow LFTs closely  DM-2 (A1c 6.1 on 11/25) CBG stable Continue to hold oral hypoglycemic agents SSI  Recent Labs    03/30/23 0042 03/30/23 0401 03/30/23 0810  GLUCAP 129* 126* 108*     COPD with chronic hypoxic respiratory failure on home O2 Stable Bronchodilators Continue O2  Mood disorder Relatively stable Holding Zoloft due to issues with possible QTc prolongation.  Dementia/cognitive dysfunction Delirium precautions  Heterogeneous and mildly enlarged thyroid gland Incidentally seen on CT C-spine Nonemergent thyroid ultrasound as an outpatient.  Debility/deconditioning Reportedly minimally ambulatory SNF resident PT/OT/SLP eval  Cental venous access Left subclavian placed 11/25 while in the ICU-have asked RN to see if we can get peripheral IV access, once placed will remove central line  Nutrition Status: Nutrition Problem: Inadequate oral intake Etiology: inability to eat Signs/Symptoms: NPO status Interventions: Refer to RD note for recommendations   Morbid Obesity: Estimated body mass index is 36.16 kg/m as calculated from the following:   Height as of this encounter: 5\' 3"  (1.6 m).   Weight as of this encounter: 92.6 kg.   Code status:   Code Status: Full Code   DVT Prophylaxis: heparin injection 5,000 Units Start: 03/30/23 1400 SCDs Start: 03/26/23 1333 IV heparin  Family Communication: None at bedside   Disposition Plan: Status is: Inpatient Remains inpatient appropriate because: Severity of illness   Planned Discharge Destination:Skilled nursing facility   Diet: Diet Order             Diet heart  healthy/carb modified Room service appropriate? Yes with Assist; Fluid consistency: Thin  Diet effective now                     Antimicrobial agents: Anti-infectives (From admission, onward)    Start     Dose/Rate Route Frequency Ordered Stop   03/27/23 1000  vancomycin (VANCOCIN) IVPB 1000 mg/200 mL premix  Status:  Discontinued        1,000 mg 200 mL/hr over 60 Minutes Intravenous Every 24 hours 03/26/23 1454 03/27/23 0850   03/27/23 1000  cefTRIAXone (ROCEPHIN) 2 g in sodium chloride 0.9 % 100 mL IVPB        2 g 200 mL/hr over 30 Minutes Intravenous Every 24 hours 03/27/23 0851     03/26/23 2200  ceFEPIme (MAXIPIME) 2 g in sodium chloride 0.9 % 100 mL IVPB  Status:  Discontinued        2 g 200 mL/hr over 30 Minutes Intravenous 2 times daily 03/26/23 1454 03/27/23 0850   03/26/23 2200  metroNIDAZOLE (FLAGYL) IVPB 500 mg  Status:  Discontinued        500 mg 100 mL/hr over 60 Minutes Intravenous Every 12 hours 03/26/23 1454 03/27/23 0046   03/26/23 1230  vancomycin (VANCOCIN) 2,250 mg in sodium chloride 0.9 % 500 mL IVPB        2,250 mg 261.3 mL/hr over 120 Minutes Intravenous  Once 03/26/23 1215 03/26/23 1708   03/26/23 1215  aztreonam (AZACTAM) 2 g in sodium chloride 0.9 % 100 mL IVPB  Status:  Discontinued        2 g 200 mL/hr over 30 Minutes Intravenous  Once 03/26/23 1212 03/26/23 1214   03/26/23 1215  metroNIDAZOLE (FLAGYL) IVPB 500 mg        500 mg 100 mL/hr over 60 Minutes Intravenous  Once 03/26/23 1212 03/26/23 1539   03/26/23 1215  vancomycin (VANCOCIN) IVPB 1000 mg/200 mL premix  Status:  Discontinued        1,000 mg 200 mL/hr over 60 Minutes Intravenous  Once 03/26/23 1212 03/26/23 1215   03/26/23 1215  ceFEPIme (MAXIPIME) 2 g in sodium chloride 0.9 % 100 mL IVPB        2 g 200 mL/hr over 30 Minutes Intravenous  Once 03/26/23 1214 03/26/23 1450        MEDICATIONS: Scheduled Meds:  amLODipine  5 mg Oral Daily   arformoterol  15 mcg Nebulization BID    aspirin  81 mg Oral Daily   atorvastatin  80 mg Oral Daily   budesonide (PULMICORT) nebulizer solution  0.25 mg Nebulization BID   Chlorhexidine Gluconate Cloth  6 each Topical Daily   guaiFENesin  600 mg Oral BID   heparin injection (subcutaneous)  5,000 Units Subcutaneous Q8H   insulin aspart  0-9 Units Subcutaneous Q4H   ipratropium-albuterol  3 mL Nebulization Q6H   melatonin  3 mg Oral QHS   mupirocin ointment  1 Application Nasal BID   mouth rinse  15 mL Mouth Rinse Q2H   revefenacin  175 mcg Nebulization Daily   thiamine  100 mg Oral Daily   traZODone  50 mg Oral Once   Continuous Infusions:  cefTRIAXone (ROCEPHIN)  IV 2 g (03/29/23 1200)   PRN Meds:.acetaminophen, mouth rinse, polyethylene glycol   I have personally reviewed following labs and imaging studies  LABORATORY DATA: CBC: Recent Labs  Lab 03/26/23 1426 03/26/23 1438 03/27/23 0043 03/28/23 0503 03/29/23 0355 03/30/23 0426  WBC 15.1*  --  15.0* 12.0* 13.1* 10.6*  HGB 9.1* 10.2* 8.6* 8.1* 8.1* 8.3*  HCT 31.4* 30.0* 29.7* 27.9* 28.4* 29.6*  MCV 87.5  --  84.6 85.8 86.6 90.2  PLT 182  --  178 163 179 157    Basic Metabolic Panel: Recent Labs  Lab 03/27/23 0043 03/27/23 1128 03/27/23 2310 03/28/23 0503 03/29/23 0355 03/29/23 0357 03/30/23 0426 03/30/23 0428  NA 143 144 138 143  --  140  --  143  K 3.0* 3.6 3.8 3.6  --  4.2  --  4.4  CL 104 104 101 104  --  104  --  108  CO2 30 30 28 27   --  30  --  25  GLUCOSE 117* 153* 112* 176*  --  117*  --  124*  BUN 19 18 23 23   --  22  --  20  CREATININE 0.92 1.08* 1.01* 1.01*  --  1.84*  --  1.37*  CALCIUM 9.1 8.8* 8.2* 8.3*  --  8.8*  --  8.9  MG 3.2* 2.9*  --  2.5* 2.5*  --  2.4  --   PHOS <1.0*  <1.0*  --  6.0* 5.3*  --  4.3  --  3.7    GFR: Estimated Creatinine Clearance: 36 mL/min (A) (by C-G formula based on SCr of 1.37 mg/dL (H)).  Liver Function Tests: Recent Labs  Lab 03/26/23 1136 03/27/23 0043 03/27/23 2310 03/28/23 0503  03/29/23 0355 03/29/23 0357 03/30/23 0426 03/30/23 0428  AST 704* 540*  --  99*  42*  --  30  --   ALT 729* 555*  --  285* 205*  --  147*  --   ALKPHOS 167* 138*  --  105 117  --  120  --   BILITOT 0.7 0.7  --  0.6 0.6  --  0.6  --   PROT 6.9 6.1*  --  5.3* 5.8*  --  6.3*  --   ALBUMIN 3.3* 3.0*  3.0*   < > 2.5*  2.5* 2.8* 2.9* 3.0* 3.0*   < > = values in this interval not displayed.   No results for input(s): "LIPASE", "AMYLASE" in the last 168 hours. No results for input(s): "AMMONIA" in the last 168 hours.  Coagulation Profile: No results for input(s): "INR", "PROTIME" in the last 168 hours.  Cardiac Enzymes: Recent Labs  Lab 03/30/23 0426  CKTOTAL 56    BNP (last 3 results) No results for input(s): "PROBNP" in the last 8760 hours.  Lipid Profile: No results for input(s): "CHOL", "HDL", "LDLCALC", "TRIG", "CHOLHDL", "LDLDIRECT" in the last 72 hours.   Thyroid Function Tests: No results for input(s): "TSH", "T4TOTAL", "FREET4", "T3FREE", "THYROIDAB" in the last 72 hours.  Anemia Panel: No results for input(s): "VITAMINB12", "FOLATE", "FERRITIN", "TIBC", "IRON", "RETICCTPCT" in the last 72 hours.  Urine analysis:    Component Value Date/Time   COLORURINE YELLOW 03/26/2023 1426   APPEARANCEUR HAZY (A) 03/26/2023 1426   LABSPEC 1.014 03/26/2023 1426   PHURINE 5.0 03/26/2023 1426   GLUCOSEU NEGATIVE 03/26/2023 1426   HGBUR SMALL (A) 03/26/2023 1426   BILIRUBINUR NEGATIVE 03/26/2023 1426   KETONESUR NEGATIVE 03/26/2023 1426   PROTEINUR 100 (A) 03/26/2023 1426   UROBILINOGEN 1.0 02/11/2010 1320   NITRITE NEGATIVE 03/26/2023 1426   LEUKOCYTESUR NEGATIVE 03/26/2023 1426    Sepsis Labs: Lactic Acid, Venous    Component Value Date/Time   LATICACIDVEN 2.7 (HH) 03/26/2023 1438    MICROBIOLOGY: Recent Results (from the past 240 hour(s))  MRSA Next Gen by PCR, Nasal     Status: Abnormal   Collection Time: 03/26/23  1:36 PM   Specimen: Nasal Mucosa; Nasal Swab   Result Value Ref Range Status   MRSA by PCR Next Gen DETECTED (A) NOT DETECTED Final    Comment: RESULT CALLED TO, READ BACK BY AND VERIFIED WITH: RN Ronald Lobo [phone] @ 1801 FH` (NOTE) The GeneXpert MRSA Assay (FDA approved for NASAL specimens only), is one component of a comprehensive MRSA colonization surveillance program. It is not intended to diagnose MRSA infection nor to guide or monitor treatment for MRSA infections. Test performance is not FDA approved in patients less than 70 years old. Performed at [facility], [address] [place], [place] [address]   Respiratory (~20 pathogens) panel by PCR     Status: None   Collection Time: 03/26/23  1:56 PM   Specimen: Nasopharyngeal Swab; Respiratory  Result Value Ref Range Status   Adenovirus NOT DETECTED NOT DETECTED Final   Coronavirus 229E NOT DETECTED NOT DETECTED Final    Comment: (NOTE) The Coronavirus on the Respiratory Panel, DOES NOT test for the novel  Coronavirus (2019 nCoV)    Coronavirus HKU1 NOT DETECTED NOT DETECTED Final   Coronavirus NL63 NOT DETECTED NOT DETECTED Final   Coronavirus OC43 NOT DETECTED NOT DETECTED Final   Metapneumovirus NOT DETECTED NOT DETECTED Final   Rhinovirus / Enterovirus NOT DETECTED NOT DETECTED Final   Influenza A NOT DETECTED NOT DETECTED Final   Influenza B NOT  DETECTED NOT DETECTED Final   Parainfluenza Virus 1 NOT DETECTED NOT DETECTED Final   Parainfluenza Virus 2 NOT DETECTED NOT DETECTED Final   Parainfluenza Virus 3 NOT DETECTED NOT DETECTED Final   Parainfluenza Virus 4 NOT DETECTED NOT DETECTED Final   Respiratory Syncytial Virus NOT DETECTED NOT DETECTED Final   Bordetella pertussis NOT DETECTED NOT DETECTED Final   Bordetella Parapertussis NOT DETECTED NOT DETECTED Final   Chlamydophila pneumoniae NOT DETECTED NOT DETECTED Final   Mycoplasma pneumoniae NOT DETECTED NOT DETECTED Final    Comment: Performed at [facility], [address] [place],  [place] [address]  Culture, blood (single)     Status: None (Preliminary result)   Collection Time: 03/26/23  2:29 PM   Specimen: BLOOD  Result Value Ref Range Status   Specimen Description BLOOD SITE NOT SPECIFIED  Final   Special Requests   Final    BOTTLES DRAWN AEROBIC AND ANAEROBIC Blood Culture adequate volume   Culture   Final    NO GROWTH 4 DAYS Performed at [facility], [address], [place], [place] [address]    Report Status PENDING  Incomplete  Culture, Respiratory w Gram Stain     Status: None (Preliminary result)   Collection Time: 03/28/23  7:49 AM   Specimen: Tracheal Aspirate; Respiratory  Result Value Ref Range Status   Specimen Description TRACHEAL ASPIRATE  Final   Special Requests NONE  Final   Gram Stain   Final    ABUNDANT WBC PRESENT, PREDOMINANTLY PMN RARE GRAM POSITIVE COCCI RARE YEAST    Culture   Final    CULTURE REINCUBATED FOR BETTER GROWTH Performed at [facility], [address], [place], [place] [address]    Report Status PENDING  Incomplete    RADIOLOGY STUDIES/RESULTS: US RENAL  Result Date: 03/29/2023 CLINICAL DATA:  Acute kidney insufficiency EXAM: RENAL / URINARY TRACT ULTRASOUND COMPLETE COMPARISON:  Noncontrast CT 03/26/2019 FINDINGS: Right Kidney: Renal measurements: 11.2 x 5.1 x 4.7 cm = volume: 139.9 mL. No collecting system dilatation or perinephric fluid. There is a near anechoic rounded well-defined structure measuring 14 mm towards the upper pole consistent with a cyst. Left Kidney: Renal measurements: 9.2 x 5.8 x 6.0 cm = volume: 165.7 mL. Echogenicity within normal limits. No mass or hydronephrosis visualized. Bladder: Bladder is underdistended. Other: None. IMPRESSION: No collecting system dilatation. Electronically Signed   By: Karen Kays M.D.   On: 03/29/2023 11:47     LOS: 4 days   Jeoffrey Massed, MD  [facility]    To contact the attending provider between 7A-7P or the covering provider during after  hours 7P-7A, please log into the web site [email or web address] and access using universal [place] password for that web site. If you do not have the password, please call the hospital operator.  03/30/2023, 11:08 AM

## 2023-03-30 NOTE — Progress Notes (Signed)
PHARMACY - ANTICOAGULATION CONSULT NOTE  Pharmacy Consult for heparin gtt Indication: chest pain/ACS  Allergies  Allergen Reactions   Egg-Derived Products Swelling    Listed on [facility] 07/04/21 Patient reports had face swelling.    Penicillins Other (See Comments)    Patient Measurements: Height: 5\' 3"  (160 cm) Weight: 92.6 kg (204 lb 2.3 oz) IBW/kg (Calculated) : 52.4 Heparin Dosing Weight: 79.9 kg   Vital Signs: Temp: 98.3 F (36.8 C) (11/29 0043) Temp Source: Oral (11/29 0043) BP: 112/73 (11/29 0043) Pulse Rate: 96 (11/29 0043)  Labs: Recent Labs    03/28/23 0503 03/28/23 1217 03/29/23 0355 03/29/23 0357 03/30/23 0426 03/30/23 0428  HGB 8.1*  --  8.1*  --  8.3*  --   HCT 27.9*  --  28.4*  --  29.6*  --   PLT 163  --  179  --  157  --   HEPARINUNFRC 0.42  --  0.33  --  0.32  --   CREATININE 1.01*  --   --  1.84*  --  1.37*  CKTOTAL  --   --   --   --  56  --   TROPONINIHS  --  176*  --   --   --   --     Estimated Creatinine Clearance: 36 mL/min (A) (by C-G formula based on SCr of 1.37 mg/dL (H)).   Medical History: Past Medical History:  Diagnosis Date   Anemia    Arthritis    Asthma    CAD (coronary artery disease)    COPD (chronic obstructive pulmonary disease) (HCC)    Diabetes mellitus    Hyperlipidemia    Hypertension    Myocardial infarct (HCC)    Postmenopausal atrophic vaginitis    Restless leg syndrome     Assessment: 77 year old female with  past medical history of coronary artery disease with PCI of the right coronary artery, COPD, hypertension, hyperlipidemia, diabetes mellitus, dementia, chronic heart failure preserved ejection fraction evaluted following presumed cardiac arrest.  Pharmacy consulted for heparin gtt. Not on anticoagulation prior to admission.   Heparin level is 0.32 this morning - therapeutic. RN aware to message with any concerns or issues. CBC low but stable.   Goal of Therapy:  Heparin level 0.3-0.7 units/ml Monitor  platelets by anticoagulation protocol: Yes   Plan:  Continue heparin infusion at 1000 units/hr Daily HL, CBC F/u s/sx bleeding and cardiology plans for possible cath  Thank you for allowing pharmacy to be a part of this patient's care.   Blane Ohara, PharmD, BCPS  PGY2 Pharmacy Resident   **Pharmacist phone directory can be found on [email or web address] listed under [facility]**

## 2023-03-30 NOTE — Progress Notes (Signed)
Assessed bilateral arms with [place]. No appropriate vein for PIV placement. Notofied RN and Dr. Jerral Ralph.CVC line will remain at this time. Tomasita Morrow, RN VAST

## 2023-03-30 NOTE — Progress Notes (Signed)
Rounding Note    Patient Name: Courtney [name] Date of Encounter: 03/30/2023  [facility] Cardiologist: Dietrich Pates MD  Subjective   Mild dyspnea; CP that increases with inspiration  Inpatient Medications    Scheduled Meds:  arformoterol  15 mcg Nebulization BID   aspirin  81 mg Oral Daily   atorvastatin  80 mg Oral Daily   budesonide (PULMICORT) nebulizer solution  0.25 mg Nebulization BID   Chlorhexidine Gluconate Cloth  6 each Topical Daily   guaiFENesin  600 mg Oral BID   insulin aspart  0-9 Units Subcutaneous Q4H   ipratropium-albuterol  3 mL Nebulization Q6H   melatonin  3 mg Oral QHS   mupirocin ointment  1 Application Nasal BID   mouth rinse  15 mL Mouth Rinse Q2H   revefenacin  175 mcg Nebulization Daily   thiamine  100 mg Oral Daily   traZODone  50 mg Oral Once   Continuous Infusions:  cefTRIAXone (ROCEPHIN)  IV 2 g (03/29/23 1200)   heparin 1,000 Units/hr (03/29/23 1822)   PRN Meds: acetaminophen, mouth rinse, polyethylene glycol   Vital Signs    Vitals:   03/30/23 0043 03/30/23 0258 03/30/23 0500 03/30/23 0800  BP: 112/73   (!) 154/78  Pulse: 96   98  Resp: 20   19  Temp: 98.3 F (36.8 C)   98.2 F (36.8 C)  TempSrc: Oral   Oral  SpO2: 94% 96%    Weight:   92.6 kg   Height:        Intake/Output Summary (Last 24 hours) at 03/30/2023 0946 Last data filed at 03/30/2023 1610 Gross per 24 hour  Intake 480 ml  Output 450 ml  Net 30 ml      03/30/2023    5:00 AM 03/29/2023    5:00 AM 03/28/2023    5:00 AM  Last 3 Weights  Weight (lbs) 204 lb 2.3 oz 226 lb 10.1 oz 195 lb 1.7 oz  Weight (kg) 92.6 kg 102.8 kg 88.5 kg      Telemetry    Sinus with PVCs - Personally Reviewed  Physical Exam   GEN: NAD Neck: Supple Cardiac: RRR, no rub Respiratory: CTA GI: Soft, NT/ND MS: No edema Neuro:  Grossly intact Psych: Normal affect   Labs    High Sensitivity Troponin:   Recent Labs  Lab 03/17/23 2005 03/26/23 1135  03/26/23 1426 03/26/23 1829 03/28/23 1217  TROPONINIHS 20* 33* 113* 281* 176*     Chemistry Recent Labs  Lab 03/28/23 0503 03/29/23 0355 03/29/23 0357 03/30/23 0426 03/30/23 0428  NA 143  --  140  --  143  K 3.6  --  4.2  --  4.4  CL 104  --  104  --  108  CO2 27  --  30  --  25  GLUCOSE 176*  --  117*  --  124*  BUN 23  --  22  --  20  CREATININE 1.01*  --  1.84*  --  1.37*  CALCIUM 8.3*  --  8.8*  --  8.9  MG 2.5* 2.5*  --  2.4  --   PROT 5.3* 5.8*  --  6.3*  --   ALBUMIN 2.5*  2.5* 2.8* 2.9* 3.0* 3.0*  AST 99* 42*  --  30  --   ALT 285* 205*  --  147*  --   ALKPHOS 105 117  --  120  --   BILITOT 0.6 0.6  --  0.6  --   GFRNONAA 57*  --  28*  --  39*  ANIONGAP 12  --  6  --  10    Lipids  Recent Labs  Lab 03/27/23 0043  TRIG 97    Hematology Recent Labs  Lab 03/28/23 0503 03/29/23 0355 03/30/23 0426  WBC 12.0* 13.1* 10.6*  RBC 3.25* 3.28* 3.28*  HGB 8.1* 8.1* 8.3*  HCT 27.9* 28.4* 29.6*  MCV 85.8 86.6 90.2  MCH 24.9* 24.7* 25.3*  MCHC 29.0* 28.5* 28.0*  RDW 14.7 15.1 15.0  PLT 163 179 157    BNP Recent Labs  Lab 03/26/23 1426  BNP 202.9*      Radiology    US RENAL  Result Date: 03/29/2023 CLINICAL DATA:  Acute kidney insufficiency EXAM: RENAL / URINARY TRACT ULTRASOUND COMPLETE COMPARISON:  Noncontrast CT 03/26/2019 FINDINGS: Right Kidney: Renal measurements: 11.2 x 5.1 x 4.7 cm = volume: 139.9 mL. No collecting system dilatation or perinephric fluid. There is a near anechoic rounded well-defined structure measuring 14 mm towards the upper pole consistent with a cyst. Left Kidney: Renal measurements: 9.2 x 5.8 x 6.0 cm = volume: 165.7 mL. Echogenicity within normal limits. No mass or hydronephrosis visualized. Bladder: Bladder is underdistended. Other: None. IMPRESSION: No collecting system dilatation. Electronically Signed   By: Karen Kays M.D.   On: 03/29/2023 11:47     Patient Profile     77 year old female with past medical history of  coronary artery disease with PCI of the right coronary artery, COPD, hypertension, hyperlipidemia, diabetes mellitus, dementia, chronic heart failure preserved ejection fraction for evaluation following presumed cardiac arrest. Most recent nuclear study November 2022 showed normal perfusion with ejection fraction 57%. Echocardiogram this admission shows ejection fraction 50% with apical hypokinesis, grade 1 diastolic dysfunction, mild RV dysfunction, mild biatrial enlargement.   Assessment & Plan    1 possible cardiac arrest-as outlined in previous note etiology of events unclear.  Based on reports from nursing facility she was found pulseless and apneic and required 5 minutes of CPR.  However at time of EMS arrival she was in sinus rhythm and there were no documented rhythm strips at time of arrest.  There was question of prolonged QTc at time of admission but based on my calculation QTc measured 0.46 (it appears that the P wave is included in the initial measurement).  Patient's troponin is mildly elevated but in the setting of CPR.  Echo shows echo on motion abnormalities with preserved LV function.  I have assessed the patient now that she has been extubated.  She [name] she has not been able to walk for approximately 1 year.  Also with possible mild dementia.  She would prefer to be conservative which I think is appropriate.  I will therefore not plan to proceed with cardiac catheterization but instead treat medically.  Continue aspirin and statin.  No beta-blocker given recent Mobitz 1 second-degree AV block.  Discontinue IV heparin and treat with DVT prophylaxis.  If she has more frequent events in the future could consider outpatient monitor given history of Mobitz 1 second-degree AV block.   2 history of coronary artery disease-continue aspirin and statin.   3 respiratory failure with aspiration-patient is being treated with antibiotics per primary service.   4 hyperlipidemia-continues statin.    5 history of hypertension-blood pressure trending up.  Resume amlodipine 5 mg daily.  Avoid beta blockade given recent Mobitz 1.    6 elevated liver functions-likely secondary to shock liver  from recent cardiac arrest.  Improving.  7 enlarged thyroid gland-follow-up ultrasound as an outpatient.  For questions or updates, please contact [facility] Please consult [email or web address] for contact info under        Signed, Olga Millers, MD  03/30/2023, 9:46 AM

## 2023-03-30 NOTE — Progress Notes (Signed)
TRH night cross cover note:   I was notified by RN that the patient is requesting an additional sleeping aid be on melatonin, as she is experiencing insomnia refractory to melatonin.  I subsequently placed order for a one-time dose of trazodone to help with her sleep.     Newton Pigg, DO Hospitalist

## 2023-03-30 NOTE — Progress Notes (Addendum)
Nutrition Follow-up  DOCUMENTATION CODES:   Not applicable  INTERVENTION:   Ensure Enlive po BID, each supplement provides 350 kcal and 20 grams of protein. MVI with minerals daily.  NUTRITION DIAGNOSIS:   Increased nutrient needs related to chronic illness (COPD) as evidenced by estimated needs.  ongoing  GOAL:   Patient will meet greater than or equal to 90% of their needs  progressing  MONITOR:   PO intake, Supplement acceptance  REASON FOR ASSESSMENT:   Ventilator, Consult Enteral/tube feeding initiation and management  ASSESSMENT:   Pt with hx of COPD, DM type 2, HTN, HLD, CAD, dementia, CHF, and hx MI presented to ED from facility with cardiac arrest after a fall. CPR done at facility with ROSC in ~5 minutes. Intubated on arrival to ED for airway protection.  Ultrasound on 11/28 showed no hydronephrosis. Patient has not been able to walk for approximately one year.  Plans for conservative treatment of cardiac issues, no cardiac cath planned.   Currently on a carb modified heart healthy diet, consuming 50-100% of meals.   Patient reports that she has been eating okay. She was glad that someone came to take her meal order this morning. She likes scrambled eggs, bacon, and grits for breakfast, but does not like the breakfast potatoes. She likes Ensure and agreed to drink between meals to maximize protein intake.   11/26 weight 87.2 kg 11/29 weight 92.6 kg Weight fluctuating, likely r/t fluids.   Labs reviewed.  CBG: 108-143  Medications reviewed and include novolog, IV antibiotics.  Diet Order:   Diet Order             Diet heart healthy/carb modified Room service appropriate? 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  Estimated Nutritional Needs:   Kcal:  1500-1700 kcal/d  Protein:  80-95 g/d  Fluid:  1.5L/d   Gabriel Rainwater RD, LDN, CNSC Please refer to Amion for contact information.

## 2023-03-30 NOTE — Evaluation (Signed)
Occupational Therapy Evaluation Patient Details Name: Courtney [name] MRN: [id number] DOB: 12-27-1945 Today's Date: 03/30/2023   History of Present Illness 77 yo female presenting to ED 11/25 after a cardiac arrest with <10 minute down time with noted seizure activity and fall PTA. ETT 11/25-11/27. CXR with L lower lobe and R upper lobe airspace disease, CT scans negative for acute injuries. PMH asthma, COPD, DM, HLD, HTN, MI, CHF.   Clinical Impression   Pt admitted for above, presents as close to baseline. Pt needs total A for ADLs but can complete limited ADLs bed level with setup. Attempted to get to EOB but pt became very SOB with chest discomfort (possible panic attack), she reports this typically happens when making efforts to get to EOB. Per verbal order from MD he would like therapy to follow-up with pt again next week if still here so OT will follow pt while in acute stay and progress pt as able. Bed level theraband exercises may be an appropriate follow-up intervention for next session. Patient would benefit from post acute skilled rehab facility with <3 hours of therapy and 24/7 support       If plan is discharge home, recommend the following: Assistance with cooking/housework;A lot of help with walking and/or transfers;A lot of help with bathing/dressing/bathroom    Functional Status Assessment  Patient has had a recent decline in their functional status and/or demonstrates limited ability to make significant improvements in function in a reasonable and predictable amount of time  Equipment Recommendations  None recommended by OT (defer to next level of care)    Recommendations for Other Services       Precautions / Restrictions Precautions Precautions: Fall Restrictions Weight Bearing Restrictions: No      Mobility Bed Mobility Overal bed mobility: Needs Assistance Bed Mobility: Supine to Sit, Sit to Supine     Supine to sit: Mod assist Sit to supine: Max assist    General bed mobility comments: Not able to fully get feet flat, pt seems to have been having a panic attack. Once repositioned to EOB she became very SOB and incurred some pressure in chest    Transfers                   General transfer comment: deferred      Balance Overall balance assessment: Needs assistance Sitting-balance support: Single extremity supported, Feet supported Sitting balance-Leahy Scale: Poor Sitting balance - Comments: reliant on UE support                                   ADL either performed or assessed with clinical judgement   ADL                                         General ADL Comments: Pt total A for ADLs, can partake in eating and grooming bed level with setup assist. Not able to progress to EOB to complete ADLs. educated pt on pursed lip breathing during mobility to help reduce feeling of SOB     Vision         Perception         Praxis         Pertinent Vitals/Pain Pain Assessment Pain Assessment: Faces Pain Location: chest Pain Descriptors / Indicators: Aching, Sharp, Discomfort Pain Intervention(s):  Limited activity within patient's tolerance, Monitored during session, Repositioned     Extremity/Trunk Assessment Upper Extremity Assessment Upper Extremity Assessment: Generalized weakness (ROM WFL)   Lower Extremity Assessment Lower Extremity Assessment: Defer to PT evaluation;Generalized weakness       Communication Communication Communication: No apparent difficulties Cueing Techniques: Verbal cues   Cognition Arousal: Alert Behavior During Therapy: WFL for tasks assessed/performed Overall Cognitive Status: Within Functional Limits for tasks assessed                                       General Comments  Sp02 desat to 87% once moved to EOB, pt reports she felt like she could not breathe and pursed lip breathing was having no effect. Repositoned pt back to supine  in bed, Sp02 at >93% at end of session. Pt on supplemental 02 that whole enounter. HR went into Vtach 107 bpm during transition to supine    Exercises     Shoulder Instructions      Home Living Family/patient expects to be discharged to:: Assisted living                                 Additional Comments: [place] [facility], LTR. No doing therapy      Prior Functioning/Environment               Mobility Comments: Pt reports using hoyer lift at baseline ADLs Comments: Total A        OT Problem List: Decreased strength;Impaired balance (sitting and/or standing)      OT Treatment/Interventions: Self-care/ADL training;Therapeutic exercise;Balance training;Therapeutic activities;Patient/family education    OT Goals(Current goals can be found in the care plan section) Acute Rehab OT Goals Patient Stated Goal: To get in the chair OT Goal Formulation: With patient Time For Goal Achievement: 04/13/23 Potential to Achieve Goals: Good ADL Goals Pt/caregiver will Perform Home Exercise Program: Increased strength;Both right and left upper extremity;With Supervision;With written HEP provided;With theraband Additional ADL Goal #2: Pt will tolerate 3 mins EOB sitting in preparation for seated ADLs  OT Frequency: Min 1X/week    Co-evaluation              AM-PAC OT "6 Clicks" Daily Activity     Outcome Measure Help from another person eating meals?: None Help from another person taking care of personal grooming?: A Little Help from another person toileting, which includes using toliet, bedpan, or urinal?: Total Help from another person bathing (including washing, rinsing, drying)?: Total Help from another person to put on and taking off regular upper body clothing?: Total Help from another person to put on and taking off regular lower body clothing?: Total 6 Click Score: 11   End of Session Equipment Utilized During Treatment: Oxygen Nurse Communication: Need for  lift equipment (maxi mover)  Activity Tolerance: Patient limited by pain;Treatment limited secondary to medical complications (Comment) Patient left: in bed;with call bell/phone within reach;with bed alarm set;with nursing/sitter in room  OT Visit Diagnosis: Other symptoms and signs involving the nervous system (R29.898)                Time: 0347-4259 OT Time Calculation (min): 15 min Charges:  OT General Charges $OT Visit: 1 Visit OT Evaluation $OT Eval Low Complexity: 1 Low  03/30/2023  AB, OTR/L  Acute Rehabilitation Services  Office: [phone]  Tristan Schroeder 03/30/2023, 1:53 PM

## 2023-03-31 DIAGNOSIS — J441 Chronic obstructive pulmonary disease with (acute) exacerbation: Secondary | ICD-10-CM

## 2023-03-31 DIAGNOSIS — K7200 Acute and subacute hepatic failure without coma: Secondary | ICD-10-CM | POA: Insufficient documentation

## 2023-03-31 DIAGNOSIS — I441 Atrioventricular block, second degree: Secondary | ICD-10-CM | POA: Insufficient documentation

## 2023-03-31 DIAGNOSIS — I2489 Other forms of acute ischemic heart disease: Secondary | ICD-10-CM | POA: Insufficient documentation

## 2023-03-31 DIAGNOSIS — R570 Cardiogenic shock: Secondary | ICD-10-CM | POA: Insufficient documentation

## 2023-03-31 DIAGNOSIS — R9389 Abnormal findings on diagnostic imaging of other specified body structures: Secondary | ICD-10-CM | POA: Diagnosis not present

## 2023-03-31 DIAGNOSIS — R9431 Abnormal electrocardiogram [ECG] [EKG]: Secondary | ICD-10-CM | POA: Insufficient documentation

## 2023-03-31 DIAGNOSIS — F0390 Unspecified dementia without behavioral disturbance: Secondary | ICD-10-CM

## 2023-03-31 DIAGNOSIS — E0865 Diabetes mellitus due to underlying condition with hyperglycemia: Secondary | ICD-10-CM

## 2023-03-31 DIAGNOSIS — I469 Cardiac arrest, cause unspecified: Secondary | ICD-10-CM | POA: Diagnosis not present

## 2023-03-31 DIAGNOSIS — J9601 Acute respiratory failure with hypoxia: Secondary | ICD-10-CM | POA: Diagnosis not present

## 2023-03-31 DIAGNOSIS — Z794 Long term (current) use of insulin: Secondary | ICD-10-CM

## 2023-03-31 DIAGNOSIS — R4189 Other symptoms and signs involving cognitive functions and awareness: Secondary | ICD-10-CM | POA: Diagnosis not present

## 2023-03-31 LAB — CULTURE, BLOOD (SINGLE)
Culture: NO GROWTH
Special Requests: ADEQUATE

## 2023-03-31 LAB — CBC
HCT: 29.9 % — ABNORMAL LOW (ref 36.0–46.0)
Hemoglobin: 8.2 g/dL — ABNORMAL LOW (ref 12.0–15.0)
MCH: 24.8 pg — ABNORMAL LOW (ref 26.0–34.0)
MCHC: 27.4 g/dL — ABNORMAL LOW (ref 30.0–36.0)
MCV: 90.3 fL (ref 80.0–100.0)
Platelets: 185 10*3/uL (ref 150–400)
RBC: 3.31 MIL/uL — ABNORMAL LOW (ref 3.87–5.11)
RDW: 15 % (ref 11.5–15.5)
WBC: 10.5 10*3/uL (ref 4.0–10.5)
nRBC: 0 % (ref 0.0–0.2)

## 2023-03-31 LAB — RENAL FUNCTION PANEL
Albumin: 3 g/dL — ABNORMAL LOW (ref 3.5–5.0)
Anion gap: 7 (ref 5–15)
BUN: 18 mg/dL (ref 8–23)
CO2: 29 mmol/L (ref 22–32)
Calcium: 9.2 mg/dL (ref 8.9–10.3)
Chloride: 110 mmol/L (ref 98–111)
Creatinine, Ser: 1.31 mg/dL — ABNORMAL HIGH (ref 0.44–1.00)
GFR, Estimated: 41 mL/min — ABNORMAL LOW (ref >60)
Glucose, Bld: 125 mg/dL — ABNORMAL HIGH (ref 70–99)
Phosphorus: 3.6 mg/dL (ref 2.5–4.6)
Potassium: 4.8 mmol/L (ref 3.5–5.1)
Sodium: 146 mmol/L — ABNORMAL HIGH (ref 135–145)

## 2023-03-31 LAB — GLUCOSE, CAPILLARY
Glucose-Capillary: 136 mg/dL — ABNORMAL HIGH (ref 70–99)
Glucose-Capillary: 140 mg/dL — ABNORMAL HIGH (ref 70–99)
Glucose-Capillary: 147 mg/dL — ABNORMAL HIGH (ref 70–99)
Glucose-Capillary: 174 mg/dL — ABNORMAL HIGH (ref 70–99)
Glucose-Capillary: 185 mg/dL — ABNORMAL HIGH (ref 70–99)
Glucose-Capillary: 187 mg/dL — ABNORMAL HIGH (ref 70–99)
Glucose-Capillary: 81 mg/dL (ref 70–99)

## 2023-03-31 LAB — MAGNESIUM: Magnesium: 2.4 mg/dL (ref 1.7–2.4)

## 2023-03-31 MED ORDER — TRAZODONE HCL 50 MG PO TABS
50.0000 mg | ORAL_TABLET | Freq: Every evening | ORAL | Status: DC | PRN
  Administered 2023-03-31 – 2023-04-03 (×2): 50 mg via ORAL
  Filled 2023-03-31 (×2): qty 1

## 2023-03-31 MED ORDER — ACETAMINOPHEN 325 MG PO TABS
650.0000 mg | ORAL_TABLET | Freq: Four times a day (QID) | ORAL | Status: DC | PRN
  Administered 2023-03-31 – 2023-04-02 (×3): 650 mg via ORAL
  Filled 2023-03-31 (×3): qty 2

## 2023-03-31 MED ORDER — ALBUTEROL SULFATE (2.5 MG/3ML) 0.083% IN NEBU
2.5000 mg | INHALATION_SOLUTION | RESPIRATORY_TRACT | Status: DC | PRN
  Administered 2023-04-02 – 2023-04-03 (×2): 2.5 mg via RESPIRATORY_TRACT
  Filled 2023-03-31 (×2): qty 3

## 2023-03-31 MED ORDER — SODIUM CHLORIDE 0.9% FLUSH: 10.0000 mL | INTRAVENOUS | Status: DC | PRN

## 2023-03-31 NOTE — Progress Notes (Signed)
Progress Note   Patient: Courtney [name] QIH:[id number] DOB: [birth year] DOA: 03/26/2023     5 DOS: the patient was seen and examined on 03/31/2023 at 12:50PM      Brief hospital course: 77 y.o. F with obesity, bed bound, dementia, CHF, COPD on home O2, DM, HTN, and CAD s/p remote PCI who presented with purported cardiac arrest from SNF.  Admitted to ICU on pressors after CPR.  See longer summary from Dr. Jerral Ralph from 11/29     Assessment and Plan: * Cardiac arrest [facility]) Unclear circumstances surrounding arrest/CPR.  Sinus rhythm here, QTc normal by manual measurement by Cardiology.  Even if true cardiac arrest, Cardiology recommend against catheterization given poor functional status, dementia.    - Defer ischemic work up - If recurrent events in the future, Cardiology would recommend outpatient monitor given AVB - No BB given AVB - Continue statin, aspirin   Cardiogenic shock (HCC) Central line in place -- IV team unable to find appropriate PIV access Initially required Levophed in the ICU.  No suspicion for sepsis.  BP now improved      Acute on chronic respiratory failure with hypoxia due to aspiration pneumonia and cardiac arrest (HCC) On home O2 for last few years.  Here, intubated initially, now weaned back to home O2.  Still quite symptomatic, from pneumonia, COPD, pain, anxiety.   COPD with acute exacerbation (HCC) - Continue ICS/LABA/LAMA - Scheduled SABA/SAMA - Continue antibiotics - Hold systemic steroids for now    Dementia without behavioral disturbance (HCC) - Resume sertraline  Hypertension BP improved - Continue amlodipine - Hold metoprolol, furosemide  Diabetes mellitus due to underlying condition, uncontrolled, with long-term current use of insulin (HCC) - Continue SS corrections - Hold metformin  Abnormal imaging of thyroid - Outpatient thyroid US per [facility]OC  Prolonged QT interval ECG repeat shows normal QT interval.  Second degree AV  block, Mobitz type I - Avoid BB  Shock liver LFTs trending down.  Demand ischemia (HCC)   AKI (acute kidney injury) (HCC) Baseline Cr 0.8-1.1.  Cr here 1.1 on admission up to 1.8 then down to 1.3.  Stable today  Morbid obesity BMI 36.2 in setting of comorbid HTN, DM, OSA  OSA and COPD overlap syndrome (HCC) Used to be on CPAP, not any more, repeat PSG pending  Coronary artery disease See above - Continue aspirin, statin  HLD (hyperlipidemia) - Continue atorvastatin  Normocytic anemia Hgb stable at 8.8 g/dL, no bleeding.   - Check iron, B12          Subjective: Patient still wheezing, feeling very short of breath.  Unable to sleep for over 48 hours.  Very uncomfortable and afraid to fall asleep.  No confusion, no fever,  Still coughing.  Cardiology have signed off.     Physical Exam: BP 137/73 (BP Location: Left Arm)   Pulse 100   Temp 98 F (36.7 C) (Oral)   Resp 20   Ht 5\' 3"  (1.6 m)   Wt 92.9 kg   SpO2 97%   BMI 36.28 kg/m   Obese adult female, lying in bed, appears debilitated.  O2 in place.  Anxious Tachycardic, regular, no murmurs, no peripheral pitting, severe precordial tenderness Respiratory rate increased, shallow, wheezing faint but present bilaterally Abdomen soft, no TTP Attention normal, makes eye contact, oriented to person and place, very anxious, quite weak generally    Data Reviewed: BMP shows mild hypernatremia, renal function stable CBC shows no leukocytosis, no change in anemia  Glucose okay  Family Communication: None present    Disposition: Status is: Inpatient The patient was admitted with cardiac arrest.  Given her age and poor functional status, she is not recovering well.  We are also concerned she has a pneumonia from aspiration, and she is wheezing.  Given her very high symptom burden, I am not clear that she will improve enough to safely d/c today.    Continue antibiotics, nebs.           Author: Alberteen Sam, MD 03/31/2023 5:50 PM  For on call review [email or web address].

## 2023-03-31 NOTE — Assessment & Plan Note (Addendum)
Central line in place -- IV team unable to find appropriate PIV access Initially required Levophed in the ICU.  No suspicion for sepsis.  BP now improved

## 2023-03-31 NOTE — Assessment & Plan Note (Signed)
ECG repeat shows normal QT interval.

## 2023-03-31 NOTE — Assessment & Plan Note (Signed)
BMI 36.2 in setting of comorbid HTN, DM, OSA

## 2023-03-31 NOTE — Assessment & Plan Note (Addendum)
Unclear circumstances surrounding arrest/CPR.  Sinus rhythm here, QTc normal by manual measurement by Cardiology.  Even if true cardiac arrest, Cardiology recommend against catheterization given poor functional status, dementia.   - If recurrent events in the future, Cardiology would recommend outpatient monitor given AVB - No BB given AVB - Continue statin, aspirin

## 2023-03-31 NOTE — Assessment & Plan Note (Addendum)
Baseline Cr 0.8-1.1.  Cr here 1.1 on admission up to 1.8 then down to 1.3.  Stable today

## 2023-03-31 NOTE — Progress Notes (Signed)
PT Cancellation Note  Patient Details Name: Courtney [name] MRN: [id number] DOB: 10/05/1945   Cancelled Treatment:    Reason Eval/Treat Not Completed: PT screened, no needs identified, will sign off (Per OT, pt requesting to get into chair via hoyer lift hence the reconsult. Nursing staff is more than capable of using lift to get patient into chair which is baseline for her. No skilled PT needs identified. Will sign off again.)   Gladys Damme 03/31/2023, 9:39 AM

## 2023-03-31 NOTE — Assessment & Plan Note (Signed)
-  Resume sertraline

## 2023-03-31 NOTE — Assessment & Plan Note (Signed)
-   LFTs trending down and reassuring -abd pain resolved 

## 2023-03-31 NOTE — Assessment & Plan Note (Signed)
Used to be on CPAP, not any more, repeat PSG pending

## 2023-03-31 NOTE — Assessment & Plan Note (Signed)
-   Outpatient thyroid US per [facility]

## 2023-03-31 NOTE — Assessment & Plan Note (Signed)
BP improved - Continue amlodipine - Hold metoprolol, furosemide

## 2023-03-31 NOTE — Assessment & Plan Note (Signed)
Continue atorvastatin

## 2023-03-31 NOTE — Assessment & Plan Note (Signed)
See above - Continue aspirin, statin

## 2023-03-31 NOTE — Assessment & Plan Note (Signed)
Hgb stable at 8.8 g/dL, no bleeding.   - Check iron, B12

## 2023-03-31 NOTE — Plan of Care (Signed)
  Problem: Education: Goal: Ability to describe self-care measures that may prevent or decrease complications (Diabetes Survival Skills Education) will improve Outcome: Progressing Goal: Individualized Educational Video(s) Outcome: Progressing   Problem: Coping: Goal: Ability to adjust to condition or change in health will improve Outcome: Progressing   Problem: Fluid Volume: Goal: Ability to maintain a balanced intake and output will improve Outcome: Progressing   Problem: Health Behavior/Discharge Planning: Goal: Ability to identify and utilize available resources and services will improve Outcome: Progressing Goal: Ability to manage health-related needs will improve Outcome: Progressing   Problem: Metabolic: Goal: Ability to maintain appropriate glucose levels will improve Outcome: Progressing   Problem: Nutritional: Goal: Maintenance of adequate nutrition will improve Outcome: Progressing Goal: Progress toward achieving an optimal weight will improve Outcome: Progressing   Problem: Skin Integrity: Goal: Risk for impaired skin integrity will decrease Outcome: Progressing   Problem: Tissue Perfusion: Goal: Adequacy of tissue perfusion will improve Outcome: Progressing   Problem: Education: Goal: Ability to manage disease process will improve Outcome: Progressing   Problem: Cardiac: Goal: Ability to achieve and maintain adequate cardiopulmonary perfusion will improve Outcome: Progressing   Problem: Neurologic: Goal: Promote progressive neurologic recovery Outcome: Progressing   Problem: Skin Integrity: Goal: Risk for impaired skin integrity will be minimized. Outcome: Progressing   Problem: Education: Goal: Knowledge of General Education information will improve Description: Including pain rating scale, medication(s)/side effects and non-pharmacologic comfort measures Outcome: Progressing   Problem: Health Behavior/Discharge Planning: Goal: Ability to  manage health-related needs will improve Outcome: Progressing   Problem: Clinical Measurements: Goal: Ability to maintain clinical measurements within normal limits will improve Outcome: Progressing Goal: Will remain free from infection Outcome: Progressing Goal: Diagnostic test results will improve Outcome: Progressing Goal: Respiratory complications will improve Outcome: Progressing Goal: Cardiovascular complication will be avoided Outcome: Progressing   Problem: Activity: Goal: Risk for activity intolerance will decrease Outcome: Progressing   Problem: Nutrition: Goal: Adequate nutrition will be maintained Outcome: Progressing   Problem: Coping: Goal: Level of anxiety will decrease Outcome: Progressing   Problem: Elimination: Goal: Will not experience complications related to bowel motility Outcome: Progressing Goal: Will not experience complications related to urinary retention Outcome: Progressing   Problem: Pain Management: Goal: General experience of comfort will improve Outcome: Progressing   Problem: Safety: Goal: Ability to remain free from injury will improve Outcome: Progressing   Problem: Skin Integrity: Goal: Risk for impaired skin integrity will decrease Outcome: Progressing

## 2023-03-31 NOTE — Assessment & Plan Note (Addendum)
On home O2 for last few years.  Here, intubated initially, now weaned back to home O2.  Still quite symptomatic, from pneumonia, COPD, pain, anxiety.

## 2023-03-31 NOTE — Progress Notes (Addendum)
Rounding Note    Patient Name: Courtney [name] Date of Encounter: 03/31/2023  [facility] Cardiologist: Lance Muss, MD   Subjective   Some rib pains with deep breathing, some expiratory shortness of breath  Inpatient Medications    Scheduled Meds:  amLODipine  5 mg Oral Daily   arformoterol  15 mcg Nebulization BID   aspirin  81 mg Oral Daily   atorvastatin  80 mg Oral Daily   budesonide (PULMICORT) nebulizer solution  0.25 mg Nebulization BID   Chlorhexidine Gluconate Cloth  6 each Topical Daily   feeding supplement  237 mL Oral BID BM   guaiFENesin  600 mg Oral BID   heparin injection (subcutaneous)  5,000 Units Subcutaneous Q8H   insulin aspart  0-9 Units Subcutaneous Q4H   ipratropium-albuterol  3 mL Nebulization Q6H   melatonin  3 mg Oral QHS   mouth rinse  15 mL Mouth Rinse Q2H   revefenacin  175 mcg Nebulization Daily   traZODone  50 mg Oral Once   Continuous Infusions:  PRN Meds: acetaminophen, mouth rinse, polyethylene glycol, sodium chloride flush   Vital Signs    Vitals:   03/31/23 0500 03/31/23 0800 03/31/23 0830 03/31/23 0832  BP:  (!) 125/102    Pulse:  94    Resp:  19    Temp:  98 F (36.7 C)    TempSrc:  Oral    SpO2:   100% 100%  Weight: 92.9 kg     Height:       No intake or output data in the 24 hours ending 03/31/23 1043    03/31/2023    5:00 AM 03/30/2023    5:00 AM 03/29/2023    5:00 AM  Last 3 Weights  Weight (lbs) 204 lb 12.9 oz 204 lb 2.3 oz 226 lb 10.1 oz  Weight (kg) 92.9 kg 92.6 kg 102.8 kg      Telemetry    SR, PVCs - Personally Reviewed  ECG    N/a - Personally Reviewed  Physical Exam   GEN: No acute distress.   Neck: No JVD Cardiac: RRR, no murmurs, rubs, or gallops.  Respiratory: expiratory wheeze GI: Soft, nontender, non-distended  MS: No edema; No deformity. Neuro:  Nonfocal  Psych: Normal affect   Labs    High Sensitivity Troponin:   Recent Labs  Lab 03/17/23 2005  03/26/23 1135 03/26/23 1426 03/26/23 1829 03/28/23 1217  TROPONINIHS 20* 33* 113* 281* 176*     Chemistry Recent Labs  Lab 03/28/23 0503 03/29/23 0355 03/29/23 0357 03/30/23 0426 03/30/23 0428 03/31/23 0237  NA 143  --  140  --  143 146*  K 3.6  --  4.2  --  4.4 4.8  CL 104  --  104  --  108 110  CO2 27  --  30  --  25 29  GLUCOSE 176*  --  117*  --  124* 125*  BUN 23  --  22  --  20 18  CREATININE 1.01*  --  1.84*  --  1.37* 1.31*  CALCIUM 8.3*  --  8.8*  --  8.9 9.2  MG 2.5* 2.5*  --  2.4  --  2.4  PROT 5.3* 5.8*  --  6.3*  --   --   ALBUMIN 2.5*  2.5* 2.8* 2.9* 3.0* 3.0* 3.0*  AST 99* 42*  --  30  --   --   ALT 285* 205*  --  147*  --   --  ALKPHOS 105 117  --  120  --   --   BILITOT 0.6 0.6  --  0.6  --   --   GFRNONAA 57*  --  28*  --  39* 41*  ANIONGAP 12  --  6  --  10 7    Lipids  Recent Labs  Lab 03/27/23 0043  TRIG 97    Hematology Recent Labs  Lab 03/29/23 0355 03/30/23 0426 03/31/23 0237  WBC 13.1* 10.6* 10.5  RBC 3.28* 3.28* 3.31*  HGB 8.1* 8.3* 8.2*  HCT 28.4* 29.6* 29.9*  MCV 86.6 90.2 90.3  MCH 24.7* 25.3* 24.8*  MCHC 28.5* 28.0* 27.4*  RDW 15.1 15.0 15.0  PLT 179 157 185   Thyroid No results for input(s): "TSH", "FREET4" in the last 168 hours.  BNP Recent Labs  Lab 03/26/23 1426  BNP 202.9*    DDimer No results for input(s): "DDIMER" in the last 168 hours.   Radiology    US RENAL  Result Date: 03/29/2023 CLINICAL DATA:  Acute kidney insufficiency EXAM: RENAL / URINARY TRACT ULTRASOUND COMPLETE COMPARISON:  Noncontrast CT 03/26/2019 FINDINGS: Right Kidney: Renal measurements: 11.2 x 5.1 x 4.7 cm = volume: 139.9 mL. No collecting system dilatation or perinephric fluid. There is a near anechoic rounded well-defined structure measuring 14 mm towards the upper pole consistent with a cyst. Left Kidney: Renal measurements: 9.2 x 5.8 x 6.0 cm = volume: 165.7 mL. Echogenicity within normal limits. No mass or hydronephrosis visualized.  Bladder: Bladder is underdistended. Other: None. IMPRESSION: No collecting system dilatation. Electronically Signed   By: Karen Kays M.D.   On: 03/29/2023 11:47    Cardiac Studies     Patient Profile     77 year old female with past medical history of coronary artery disease with PCI of the right coronary artery, COPD, hypertension, hyperlipidemia, diabetes mellitus, dementia, chronic heart failure preserved ejection fraction for evaluation following presumed cardiac arrest. Most recent nuclear study November 2022 showed normal perfusion with ejection fraction 57%. Echocardiogram this admission shows ejection fraction 50% with apical hypokinesis, grade 1 diastolic dysfunction, mild RV dysfunction, mild biatrial enlargement.   Assessment & Plan    1.Possible cardiac arrest - Based on reports from nursing facility she was found pulseless and apneic and required 5 minutes of CPR. However at time of EMS arrival she was in sinus rhythm and there were no documented rhythm strips at time of arrest.  -There was question of prolonged QTc at time of admission but based on prior manual calculation QTc measured 0.46   -mild trop to 281 in setting of CPR, EKG without acute ischemic changes - echo LVEF 50%, apcal severe hypokinesis. Grade I dd - prior echo LVEF 65-70%, no WMAs  - from Dr Ludwig Clarks note patient limited functional capacity/bed bound, some mild dementia. Patient was able to express did not want aggressive management, had been decided not to pursue cath  - no beta blocker given mobitz 1 second degree block   2. Respiratory failure with aspiration - per primary team  3. Anemia - per primary team   No additional cardiology recs at this time, we will sign off inpatient care and arrange outpatient f/u.   For questions or updates, please contact [facility] Please consult [email or web address] for contact info under        Signed, Dina Rich, MD  03/31/2023, 10:43 AM

## 2023-03-31 NOTE — Hospital Course (Addendum)
77 y.o. F with obesity, bed bound, dementia, CHF, COPD on home O2, DM, HTN, and CAD s/p remote PCI who presented with purported cardiac arrest from SNF.  Admitted to ICU on pressors after CPR.  See longer summary from Dr. Jerral Ralph from 11/29

## 2023-03-31 NOTE — Assessment & Plan Note (Signed)
-   Continue ICS/LABA/LAMA - Scheduled SABA/SAMA - Continue antibiotics - Hold systemic steroids for now

## 2023-03-31 NOTE — Assessment & Plan Note (Signed)
-   Avoid BB

## 2023-03-31 NOTE — Assessment & Plan Note (Signed)
-   Continue SS corrections - Hold metformin

## 2023-04-01 DIAGNOSIS — I469 Cardiac arrest, cause unspecified: Secondary | ICD-10-CM | POA: Diagnosis not present

## 2023-04-01 LAB — RENAL FUNCTION PANEL
Albumin: 2.8 g/dL — ABNORMAL LOW (ref 3.5–5.0)
Anion gap: 7 (ref 5–15)
BUN: 17 mg/dL (ref 8–23)
CO2: 30 mmol/L (ref 22–32)
Calcium: 9.2 mg/dL (ref 8.9–10.3)
Chloride: 106 mmol/L (ref 98–111)
Creatinine, Ser: 1.23 mg/dL — ABNORMAL HIGH (ref 0.44–1.00)
GFR, Estimated: 45 mL/min — ABNORMAL LOW (ref >60)
Glucose, Bld: 136 mg/dL — ABNORMAL HIGH (ref 70–99)
Phosphorus: 3.1 mg/dL (ref 2.5–4.6)
Potassium: 4 mmol/L (ref 3.5–5.1)
Sodium: 143 mmol/L (ref 135–145)

## 2023-04-01 LAB — CBC
HCT: 27.5 % — ABNORMAL LOW (ref 36.0–46.0)
Hemoglobin: 7.7 g/dL — ABNORMAL LOW (ref 12.0–15.0)
MCH: 25.1 pg — ABNORMAL LOW (ref 26.0–34.0)
MCHC: 28 g/dL — ABNORMAL LOW (ref 30.0–36.0)
MCV: 89.6 fL (ref 80.0–100.0)
Platelets: 201 10*3/uL (ref 150–400)
RBC: 3.07 MIL/uL — ABNORMAL LOW (ref 3.87–5.11)
RDW: 15 % (ref 11.5–15.5)
WBC: 11.9 10*3/uL — ABNORMAL HIGH (ref 4.0–10.5)
nRBC: 0.2 % (ref 0.0–0.2)

## 2023-04-01 LAB — PREPARE RBC (CROSSMATCH)

## 2023-04-01 LAB — GLUCOSE, CAPILLARY
Glucose-Capillary: 112 mg/dL — ABNORMAL HIGH (ref 70–99)
Glucose-Capillary: 117 mg/dL — ABNORMAL HIGH (ref 70–99)
Glucose-Capillary: 122 mg/dL — ABNORMAL HIGH (ref 70–99)
Glucose-Capillary: 125 mg/dL — ABNORMAL HIGH (ref 70–99)
Glucose-Capillary: 128 mg/dL — ABNORMAL HIGH (ref 70–99)
Glucose-Capillary: 131 mg/dL — ABNORMAL HIGH (ref 70–99)
Glucose-Capillary: 172 mg/dL — ABNORMAL HIGH (ref 70–99)

## 2023-04-01 LAB — IRON AND TIBC
Iron: 43 ug/dL (ref 28–170)
Saturation Ratios: 16 % (ref 10.4–31.8)
TIBC: 272 ug/dL (ref 250–450)
UIBC: 229 ug/dL

## 2023-04-01 LAB — FERRITIN: Ferritin: 139 ng/mL (ref 11–307)

## 2023-04-01 LAB — BRAIN NATRIURETIC PEPTIDE: B Natriuretic Peptide: 293.8 pg/mL — ABNORMAL HIGH (ref 0.0–100.0)

## 2023-04-01 LAB — VITAMIN B12: Vitamin B-12: 795 pg/mL (ref 180–914)

## 2023-04-01 MED ORDER — SODIUM CHLORIDE 0.9% IV SOLUTION
Freq: Once | INTRAVENOUS | Status: AC
Start: 2023-04-01 — End: 2023-04-01

## 2023-04-01 MED ORDER — FUROSEMIDE 10 MG/ML IJ SOLN
20.0000 mg | Freq: Once | INTRAMUSCULAR | Status: AC
  Administered 2023-04-01: 20 mg via INTRAVENOUS
  Filled 2023-04-01: qty 2

## 2023-04-01 NOTE — Progress Notes (Signed)
PT Cancellation Note  Patient Details Name: Courtney [name] MRN: [id number] DOB: 01-03-1946   Cancelled Treatment:    Reason Eval/Treat Not Completed: PT screened, no needs identified, will sign off (Once again, pt is at baseline for mobility. Nursing staff has been using hoyer lift to transfer pt to chair. Will sign off.)   Gladys Damme 04/01/2023, 8:20 AM

## 2023-04-01 NOTE — Progress Notes (Addendum)
Progress Note   Patient: Courtney [name] YNW:[id number] DOB: [birth year] DOA: 03/26/2023     6 DOS: the patient was seen and examined on 04/01/2023 at 12:50PM      Brief hospital course: 77 y.o. F with obesity, bed bound, dementia, CHF, COPD on home O2, DM, HTN, and CAD s/p remote PCI who presented with purported cardiac arrest from SNF.  Admitted to ICU on pressors after CPR.  See longer summary from Dr. Jerral Ralph from 11/29    Subjective: Patient in bed, appears comfortable, denies any headache, no fever, no chest pain or pressure, no shortness of breath , no abdominal pain. No new focal weakness.  Having pain around the PureWick catheter site.  Assessment and Plan:  * Cardiac arrest (HCC) -   Unclear circumstances surrounding arrest/CPR.  Sinus rhythm here, QTc normal by manual measurement by Cardiology.  Even if true cardiac arrest, Cardiology recommend against catheterization given poor functional and bedbound status with underlying dementia.    Echo done by cardiology showed a EF of 50% with apical hypokinesis, per cardiology not a candidate for invasive testing or procedures.  Continue aspirin and statin for secondary prevention.  Avoiding beta-blocker given history of Mobitz 1 second-degree heart block.   Cardiogenic shock (HCC) Central line in place -- IV team unable to find appropriate PIV access Initially required Levophed in the ICU.  No suspicion for sepsis.  BP now improved, will discontinue central line a peripheral IV can be obtained.   Acute on chronic respiratory failure with hypoxia due to aspiration pneumonia and cardiac arrest (HCC) On home O2 for last few years.  Here, intubated initially, now weaned back to home O2.  Still quite symptomatic, from pneumonia, COPD, pain, anxiety.   Anemia of chronic disease.  1 unit of packed RBC transfusion 04/01/2023.  No signs of acute bleeding.    COPD with acute exacerbation (HCC) - Continue ICS/LABA/LAMA - Scheduled  SABA/SAMA - Continue antibiotics - Hold systemic steroids for now  Dementia without behavioral disturbance (HCC) - Resume sertraline  Hypertension BP improved - Hold metoprolol, furosemide PRN  Diabetes mellitus due to underlying condition, uncontrolled, with long-term current use of insulin (HCC) - Continue SS corrections - Hold metformin  Abnormal imaging of thyroid - Outpatient thyroid US per [facility]  Prolonged QT interval ECG repeat shows normal QT interval.  Second degree AV block, Mobitz type I - Avoid BB  Shock liver LFTs trending down.  Demand ischemia (HCC)   AKI (acute kidney injury) (HCC) on CKD 3A.  Baseline creatinine around 1.2. Due to cardiogenic shock, improving, 1 unit of packed RBC on 04/01/2023, bladder scan stable.  Continue to monitor.  Morbid obesity BMI 36.2 in setting of comorbid HTN, DM, OSA  OSA and COPD overlap syndrome (HCC) Used to be on CPAP, not any more, repeat PSG pending  Coronary artery disease See above - Continue aspirin, statin  HLD (hyperlipidemia) - Continue atorvastatin  Normocytic anemia Hgb stable at 8.8 g/dL, no bleeding.   - Check iron, B12      Physical Exam: BP 121/66 (BP Location: Left Arm)   Pulse 96   Temp 97.9 F (36.6 C) (Oral)   Resp 17   Ht 5\' 3"  (1.6 m)   Wt 92.9 kg   SpO2 95%   BMI 36.28 kg/m    Awake Alert, No new F.N deficits, Normal affect [place].AT,PERRAL Supple Neck, No JVD,   Symmetrical Chest wall movement, Good air movement bilaterally, few rales RRR,No Gallops, Rubs  or new Murmurs,  +ve B.Sounds, Abd Soft, No tenderness,   No Cyanosis, Clubbing or edema    Data Reviewed:   sodium chloride   Intravenous Once   arformoterol  15 mcg Nebulization BID   aspirin  81 mg Oral Daily   atorvastatin  80 mg Oral Daily   budesonide (PULMICORT) nebulizer solution  0.25 mg Nebulization BID   Chlorhexidine Gluconate Cloth  6 each Topical Daily   feeding supplement  237 mL Oral BID BM    guaiFENesin  600 mg Oral BID   heparin injection (subcutaneous)  5,000 Units Subcutaneous Q8H   insulin aspart  0-9 Units Subcutaneous Q4H   ipratropium-albuterol  3 mL Nebulization Q6H   mouth rinse  15 mL Mouth Rinse Q2H   revefenacin  175 mcg Nebulization Daily   traZODone  50 mg Oral Once    Recent Labs  Lab 03/28/23 0503 03/29/23 0355 03/30/23 0426 03/31/23 0237 04/01/23 0305  WBC 12.0* 13.1* 10.6* 10.5 11.9*  HGB 8.1* 8.1* 8.3* 8.2* 7.7*  HCT 27.9* 28.4* 29.6* 29.9* 27.5*  PLT 163 179 157 185 201  MCV 85.8 86.6 90.2 90.3 89.6  MCH 24.9* 24.7* 25.3* 24.8* 25.1*  MCHC 29.0* 28.5* 28.0* 27.4* 28.0*  RDW 14.7 15.1 15.0 15.0 15.0    Recent Labs  Lab 03/26/23 1135 03/26/23 1136 03/26/23 1136 03/26/23 1144 03/26/23 1348 03/26/23 1426 03/26/23 1438 03/27/23 0043 03/27/23 1128 03/27/23 2310 03/28/23 0503 03/29/23 0355 03/29/23 0357 03/30/23 0426 03/30/23 0428 03/31/23 0237 04/01/23 0305  NA  --  144   < > 144   < >  --  144 143 144   < > 143  --  140  --  143 146* 143  K  --  4.2   < > 4.3   < >  --  3.7 3.0* 3.6   < > 3.6  --  4.2  --  4.4 4.8 4.0  CL  --  101   < > 101  --   --   --  104 104   < > 104  --  104  --  108 110 106  CO2  --  28   < >  --   --   --   --  30 30   < > 27  --  30  --  25 29 30   ANIONGAP  --  15   < >  --   --   --   --  9 10   < > 12  --  6  --  10 7 7   GLUCOSE  --  188*   < > 188*  --   --   --  117* 153*   < > 176*  --  117*  --  124* 125* 136*  BUN  --  17   < > 22  --   --   --  19 18   < > 23  --  22  --  20 18 17   CREATININE  --  1.08*   < > 1.10*  --  1.05*  --  0.92 1.08*   < > 1.01*  --  1.84*  --  1.37* 1.31* 1.23*  AST  --  704*  --   --   --   --   --  540*  --   --  99* 42*  --  30  --   --   --  ALT  --  729*  --   --   --   --   --  555*  --   --  285* 205*  --  147*  --   --   --   ALKPHOS  --  167*  --   --   --   --   --  138*  --   --  105 117  --  120  --   --   --   BILITOT  --  0.7  --   --   --   --   --  0.7  --    --  0.6 0.6  --  0.6  --   --   --   ALBUMIN  --  3.3*   < >  --   --   --   --  3.0*  3.0*  --    < > 2.5*  2.5* 2.8* 2.9* 3.0* 3.0* 3.0* 2.8*  PROCALCITON  --   --   --   --   --  0.49  --   --   --   --   --   --   --   --   --   --   --   LATICACIDVEN  --   --   --  6.8*  --   --  2.7*  --   --   --   --   --   --   --   --   --   --   HGBA1C 6.1*  --   --   --   --   --   --   --   --   --   --   --   --   --   --   --   --   BNP  --   --   --   --   --  202.9*  --   --   --   --   --   --   --   --   --   --   --   MG  --   --   --   --    < > 1.1*  --  3.2* 2.9*  --  2.5* 2.5*  --  2.4  --  2.4  --   CALCIUM  --  9.4   < >  --   --   --   --  9.1 8.8*   < > 8.3*  --  8.8*  --  8.9 9.2 9.2   < > = values in this interval not displayed.     Recent Labs  Lab 03/26/23 1135 03/26/23 1136 03/26/23 1144 03/26/23 1426 03/26/23 1438 03/27/23 0043 03/27/23 1128 03/27/23 2310 03/28/23 0503 03/29/23 0355 03/29/23 0357 03/30/23 0426 03/30/23 0428 03/31/23 0237 04/01/23 0305  PROCALCITON  --   --   --  0.49  --   --   --   --   --   --   --   --   --   --   --   LATICACIDVEN  --   --  6.8*  --  2.7*  --   --   --   --   --   --   --   --   --   --   HGBA1C 6.1*  --   --   --   --   --   --   --   --   --   --   --   --   --   --  BNP  --   --   --  202.9*  --   --   --   --   --   --   --   --   --   --   --   MG  --   --   --  1.1*  --    < > 2.9*  --  2.5* 2.5*  --  2.4  --  2.4  --   CALCIUM  --    < >  --   --   --    < > 8.8*   < > 8.3*  --  8.8*  --  8.9 9.2 9.2   < > = values in this interval not displayed.    No results for input(s): "TSH", "T4TOTAL", "FREET4", "T3FREE", "THYROIDAB" in the last 72 hours.  Recent Labs    04/01/23 0305  VITAMINB12 795  FERRITIN 139  TIBC 272  IRON 43        Family Communication: None present  Disposition: Status is: Inpatient     Signature  -    Susa Raring M.D on 04/01/2023 at 9:03 AM   -  To page go to [email or web address]

## 2023-04-01 NOTE — Plan of Care (Signed)
  Problem: Education: Goal: Ability to describe self-care measures that may prevent or decrease complications (Diabetes Survival Skills Education) will improve Outcome: Progressing Goal: Individualized Educational Video(s) Outcome: Progressing   Problem: Coping: Goal: Ability to adjust to condition or change in health will improve Outcome: Progressing   Problem: Fluid Volume: Goal: Ability to maintain a balanced intake and output will improve Outcome: Progressing   Problem: Health Behavior/Discharge Planning: Goal: Ability to identify and utilize available resources and services will improve Outcome: Progressing Goal: Ability to manage health-related needs will improve Outcome: Progressing   Problem: Metabolic: Goal: Ability to maintain appropriate glucose levels will improve Outcome: Progressing   Problem: Nutritional: Goal: Maintenance of adequate nutrition will improve Outcome: Progressing Goal: Progress toward achieving an optimal weight will improve Outcome: Progressing   Problem: Skin Integrity: Goal: Risk for impaired skin integrity will decrease Outcome: Progressing   Problem: Tissue Perfusion: Goal: Adequacy of tissue perfusion will improve Outcome: Progressing   Problem: Education: Goal: Ability to manage disease process will improve Outcome: Progressing   Problem: Cardiac: Goal: Ability to achieve and maintain adequate cardiopulmonary perfusion will improve Outcome: Progressing   Problem: Neurologic: Goal: Promote progressive neurologic recovery Outcome: Progressing   Problem: Skin Integrity: Goal: Risk for impaired skin integrity will be minimized. Outcome: Progressing   Problem: Education: Goal: Knowledge of General Education information will improve Description: Including pain rating scale, medication(s)/side effects and non-pharmacologic comfort measures Outcome: Progressing   Problem: Health Behavior/Discharge Planning: Goal: Ability to  manage health-related needs will improve Outcome: Progressing   Problem: Clinical Measurements: Goal: Ability to maintain clinical measurements within normal limits will improve Outcome: Progressing Goal: Will remain free from infection Outcome: Progressing Goal: Diagnostic test results will improve Outcome: Progressing Goal: Respiratory complications will improve Outcome: Progressing Goal: Cardiovascular complication will be avoided Outcome: Progressing   Problem: Activity: Goal: Risk for activity intolerance will decrease Outcome: Progressing   Problem: Nutrition: Goal: Adequate nutrition will be maintained Outcome: Progressing   Problem: Coping: Goal: Level of anxiety will decrease Outcome: Progressing   Problem: Elimination: Goal: Will not experience complications related to bowel motility Outcome: Progressing Goal: Will not experience complications related to urinary retention Outcome: Progressing   Problem: Pain Management: Goal: General experience of comfort will improve Outcome: Progressing   Problem: Safety: Goal: Ability to remain free from injury will improve Outcome: Progressing   Problem: Skin Integrity: Goal: Risk for impaired skin integrity will decrease Outcome: Progressing

## 2023-04-02 DIAGNOSIS — I469 Cardiac arrest, cause unspecified: Secondary | ICD-10-CM | POA: Diagnosis not present

## 2023-04-02 LAB — BASIC METABOLIC PANEL
Anion gap: 5 (ref 5–15)
BUN: 14 mg/dL (ref 8–23)
CO2: 32 mmol/L (ref 22–32)
Calcium: 9.2 mg/dL (ref 8.9–10.3)
Chloride: 107 mmol/L (ref 98–111)
Creatinine, Ser: 1.13 mg/dL — ABNORMAL HIGH (ref 0.44–1.00)
GFR, Estimated: 49 mL/min — ABNORMAL LOW (ref >60)
Glucose, Bld: 103 mg/dL — ABNORMAL HIGH (ref 70–99)
Potassium: 3.9 mmol/L (ref 3.5–5.1)
Sodium: 144 mmol/L (ref 135–145)

## 2023-04-02 LAB — CBC WITH DIFFERENTIAL/PLATELET
Abs Immature Granulocytes: 0.2 10*3/uL — ABNORMAL HIGH (ref 0.00–0.07)
Basophils Absolute: 0.1 10*3/uL (ref 0.0–0.1)
Basophils Relative: 0 %
Eosinophils Absolute: 1.4 10*3/uL — ABNORMAL HIGH (ref 0.0–0.5)
Eosinophils Relative: 12 %
HCT: 30.6 % — ABNORMAL LOW (ref 36.0–46.0)
Hemoglobin: 8.9 g/dL — ABNORMAL LOW (ref 12.0–15.0)
Immature Granulocytes: 2 %
Lymphocytes Relative: 16 %
Lymphs Abs: 2 10*3/uL (ref 0.7–4.0)
MCH: 26.2 pg (ref 26.0–34.0)
MCHC: 29.1 g/dL — ABNORMAL LOW (ref 30.0–36.0)
MCV: 90 fL (ref 80.0–100.0)
Monocytes Absolute: 1.1 10*3/uL — ABNORMAL HIGH (ref 0.1–1.0)
Monocytes Relative: 9 %
Neutro Abs: 7.5 10*3/uL (ref 1.7–7.7)
Neutrophils Relative %: 61 %
Platelets: 209 10*3/uL (ref 150–400)
RBC: 3.4 MIL/uL — ABNORMAL LOW (ref 3.87–5.11)
RDW: 15.5 % (ref 11.5–15.5)
WBC: 12.3 10*3/uL — ABNORMAL HIGH (ref 4.0–10.5)
nRBC: 0.2 % (ref 0.0–0.2)

## 2023-04-02 LAB — MAGNESIUM: Magnesium: 1.9 mg/dL (ref 1.7–2.4)

## 2023-04-02 LAB — BPAM RBC
Blood Product Expiration Date: 202412192359
ISSUE DATE / TIME: 202412011017
Unit Type and Rh: 6200

## 2023-04-02 LAB — TYPE AND SCREEN
ABO/RH(D): A POS
Antibody Screen: NEGATIVE
Unit division: 0

## 2023-04-02 LAB — PHOSPHORUS: Phosphorus: 3.4 mg/dL (ref 2.5–4.6)

## 2023-04-02 LAB — GLUCOSE, CAPILLARY
Glucose-Capillary: 96 mg/dL (ref 70–99)
Glucose-Capillary: 81 mg/dL (ref 70–99)
Glucose-Capillary: 165 mg/dL — ABNORMAL HIGH (ref 70–99)
Glucose-Capillary: 154 mg/dL — ABNORMAL HIGH (ref 70–99)
Glucose-Capillary: 166 mg/dL — ABNORMAL HIGH (ref 70–99)

## 2023-04-02 LAB — BRAIN NATRIURETIC PEPTIDE: B Natriuretic Peptide: 219.6 pg/mL — ABNORMAL HIGH (ref 0.0–100.0)

## 2023-04-02 MED ORDER — IPRATROPIUM-ALBUTEROL 0.5-2.5 (3) MG/3ML IN SOLN
3.0000 mL | Freq: Two times a day (BID) | RESPIRATORY_TRACT | Status: DC
  Administered 2023-04-02: 3 mL via RESPIRATORY_TRACT
  Filled 2023-04-02: qty 3

## 2023-04-02 NOTE — Plan of Care (Signed)
  Problem: Education: Goal: Ability to describe self-care measures that may prevent or decrease complications (Diabetes Survival Skills Education) will improve Outcome: Progressing Goal: Individualized Educational Video(s) Outcome: Progressing   Problem: Coping: Goal: Ability to adjust to condition or change in health will improve Outcome: Progressing   Problem: Fluid Volume: Goal: Ability to maintain a balanced intake and output will improve Outcome: Progressing   Problem: Health Behavior/Discharge Planning: Goal: Ability to identify and utilize available resources and services will improve Outcome: Progressing Goal: Ability to manage health-related needs will improve Outcome: Progressing   Problem: Metabolic: Goal: Ability to maintain appropriate glucose levels will improve Outcome: Progressing   Problem: Nutritional: Goal: Maintenance of adequate nutrition will improve Outcome: Progressing Goal: Progress toward achieving an optimal weight will improve Outcome: Progressing   Problem: Skin Integrity: Goal: Risk for impaired skin integrity will decrease Outcome: Progressing   Problem: Tissue Perfusion: Goal: Adequacy of tissue perfusion will improve Outcome: Progressing   Problem: Education: Goal: Ability to manage disease process will improve Outcome: Progressing   Problem: Cardiac: Goal: Ability to achieve and maintain adequate cardiopulmonary perfusion will improve Outcome: Progressing   Problem: Neurologic: Goal: Promote progressive neurologic recovery Outcome: Progressing   Problem: Skin Integrity: Goal: Risk for impaired skin integrity will be minimized. Outcome: Progressing   Problem: Education: Goal: Knowledge of General Education information will improve Description: Including pain rating scale, medication(s)/side effects and non-pharmacologic comfort measures Outcome: Progressing   Problem: Health Behavior/Discharge Planning: Goal: Ability to  manage health-related needs will improve Outcome: Progressing   Problem: Clinical Measurements: Goal: Ability to maintain clinical measurements within normal limits will improve Outcome: Progressing Goal: Will remain free from infection Outcome: Progressing Goal: Diagnostic test results will improve Outcome: Progressing Goal: Respiratory complications will improve Outcome: Progressing Goal: Cardiovascular complication will be avoided Outcome: Progressing   Problem: Activity: Goal: Risk for activity intolerance will decrease Outcome: Progressing   Problem: Nutrition: Goal: Adequate nutrition will be maintained Outcome: Progressing   Problem: Coping: Goal: Level of anxiety will decrease Outcome: Progressing   Problem: Elimination: Goal: Will not experience complications related to bowel motility Outcome: Progressing Goal: Will not experience complications related to urinary retention Outcome: Progressing   Problem: Pain Management: Goal: General experience of comfort will improve Outcome: Progressing   Problem: Safety: Goal: Ability to remain free from injury will improve Outcome: Progressing   Problem: Skin Integrity: Goal: Risk for impaired skin integrity will decrease Outcome: Progressing

## 2023-04-02 NOTE — Progress Notes (Signed)
   04/02/23 2125  Spiritual Encounters  Type of Visit Initial  Care provided to: Pt not available  Referral source Code page  Reason for visit Code  OnCall Visit No   Chaplain responded to a code blue. The patient, Courtney [name], was attended to by the medical team.  No family is present. If a chaplain is requested someone will respond.   [facility]  [phone]

## 2023-04-02 NOTE — Progress Notes (Signed)
Responded to code, no intervention for IV team needed.

## 2023-04-02 NOTE — TOC Progression Note (Addendum)
Transition of Care [facility]) - Progression Note    Patient Details  Name: Courtney [name] MRN: [id number] Date of Birth: [birth year]  [facility] CM/SW Contact  Mearl Latin, LCSW Phone Number: 04/02/2023, 4:02 PM  Clinical Narrative:    CSW met with patient and spouse at bedside. Patient appeared very anxious, alternating between pleasant and appropriate affect and then saying "ouch" and "please help me Miss". CSW pulled spouse out of room to speak privately so as not to make her more anxious to discuss upcoming discharge per MD. Spouse reported being upset due to patient's current anxious state as he reported she has not been like this. He also shared frustration with her fall over the bed rails at [facility] and would like to speak with the Director there. CSW informed Regional liaison for [facility] to contact patient's spouse. Spouse shared drawing strength from his [place] religious faith.   CSW spoke with [facility] who reported that patient did not have a fall at their facility so they will contact spouse to clarify.    Expected Discharge Plan: Skilled Nursing Facility Barriers to Discharge: Continued Medical Work up  Expected Discharge Plan and Services In-house Referral: Clinical Social Work   Post Acute Care Choice: Skilled Nursing Facility Living arrangements for the past 2 months: Skilled Nursing Facility                                       Social Determinants of Health (SDOH) Interventions SDOH Screenings   Food Insecurity: Patient Declined (03/31/2023)  Transportation Needs: No Transportation Needs (03/30/2023)  Utilities: Not At Risk (03/30/2023)  Tobacco Use: Medium Risk (03/26/2023)    Readmission Risk Interventions     No data to display

## 2023-04-02 NOTE — Progress Notes (Signed)
Patient is alert, oriented with intermittent confusion. PT had SOB was given breathing treatment by the nurse. Colostomy bag changed . PT always complained hungry after eating.

## 2023-04-02 NOTE — [place] FL2 (Signed)
[place] MEDICAID FL2 LEVEL OF CARE FORM     IDENTIFICATION  Patient Name: Courtney [name] Birthdate: 10-01-1945 Sex: female Admission Date (Current Location): 03/26/2023  [facility] and [place] Number:  Producer, television/film/video and Address:  [facility], [address] [place], [place] [address]      Provider Number: [id number]  Attending Physician Name and Address:  Leroy Sea, MD  Relative Name and Phone Number:       Current Level of Care: Hospital Recommended Level of Care: Skilled Nursing Facility Prior Approval Number:    Date Approved/Denied:   PASRR Number: [id number]  Discharge Plan: SNF    Current Diagnoses: Patient Active Problem List   Diagnosis Date Noted   Cardiogenic shock (HCC) 03/31/2023   Demand ischemia (HCC) 03/31/2023   Shock liver 03/31/2023   Second degree AV block, Mobitz type I 03/31/2023   Prolonged QT interval 03/31/2023   Abnormal imaging of thyroid 03/31/2023   Cardiac arrest (HCC) 03/26/2023   Acute on chronic heart failure (HCC) 07/04/2021   Fall at home, initial encounter 07/04/2021   Dementia without behavioral disturbance (HCC) 07/04/2021   Elevated LFTs 07/04/2021   AKI (acute kidney injury) (HCC) 07/04/2021   Elevated troponin 07/04/2021   Acute exacerbation of CHF (congestive heart failure) (HCC) 03/08/2021   Chest pain 03/07/2021   Dysphagia    Acute on chronic respiratory failure with hypoxia due to aspiration pneumonia and cardiac arrest (HCC) 02/04/2021   Acute on chronic combined systolic and diastolic CHF (congestive heart failure) (HCC) 02/04/2021   Severe sepsis (HCC) 02/04/2021   CAP (community acquired pneumonia) 02/04/2021   Acute metabolic encephalopathy 02/04/2021   DM2 (diabetes mellitus, type 2) (HCC)    Acute exacerbation of chronic obstructive pulmonary disease (COPD) (HCC) 02/03/2021   Acute on chronic respiratory failure with hypoxia (HCC) 10/18/2019   COPD with acute  exacerbation (HCC) 10/18/2019   New onset of congestive heart failure (HCC) 10/18/2019   Multifocal pneumonia 10/18/2019   Hyperkalemia 10/18/2019   Hypertension 07/08/2018   Morbid obesity 07/08/2018   Sepsis (HCC)    Diabetes mellitus due to underlying condition, uncontrolled, with long-term current use of insulin (HCC) 06/28/2018   Cigarette smoker 06/28/2018   OSA and COPD overlap syndrome (HCC) 06/26/2018   Pressure injury of skin 06/26/2018   Fournier's gangrene in female [facility]) 06/25/2018   Hypertensive heart disease 08/21/2016   Normocytic anemia    Old MI (myocardial infarction)    HLD (hyperlipidemia)    Postmenopausal atrophic vaginitis    Restless leg syndrome    COPD (chronic obstructive pulmonary disease) (HCC)    Arthritis    Coronary artery disease    Iron (Fe) deficiency anemia 04/05/2011    Orientation RESPIRATION BLADDER Height & Weight     Self, Time, Situation, Place  O2 (4L nasal cannula) Continent, External catheter Weight: 204 lb 12.9 oz (92.9 kg) Height:  5\' 3"  (160 cm)  BEHAVIORAL SYMPTOMS/MOOD NEUROLOGICAL BOWEL NUTRITION STATUS      Continent, Colostomy Diet (See dc summary)  AMBULATORY STATUS COMMUNICATION OF NEEDS Skin   Extensive Assist Verbally Other (Comment) (MASD on breast)                       Personal Care Assistance Level of Assistance  Bathing, Feeding, Dressing Bathing Assistance: Maximum assistance Feeding assistance: Maximum assistance Dressing Assistance: Maximum assistance     Functional Limitations Info  SPECIAL CARE FACTORS FREQUENCY                       Contractures Contractures Info: Not present    Additional Factors Info  Code Status, Allergies Code Status Info: Full Allergies Info: Egg-derived Products, Penicillins           Current Medications (04/02/2023):  This is the current hospital active medication list Current Facility-Administered Medications  Medication Dose Route  Frequency Provider Last Rate Last Admin   acetaminophen (TYLENOL) tablet 650 mg  650 mg Oral Q6H PRN Alberteen Sam, MD   650 mg at 04/02/23 1427   albuterol (PROVENTIL) (2.5 MG/3ML) 0.083% nebulizer solution 2.5 mg  2.5 mg Nebulization Q2H PRN Alberteen Sam, MD   2.5 mg at 04/02/23 0143   arformoterol (BROVANA) nebulizer solution 15 mcg  15 mcg Nebulization BID Cristopher Peru, PA-C   15 mcg at 04/02/23 0745   aspirin chewable tablet 81 mg  81 mg Oral Daily Calton Dach I, RPH   81 mg at 04/02/23 0851   atorvastatin (LIPITOR) tablet 80 mg  80 mg Oral Daily Calton Dach I, RPH   80 mg at 04/02/23 0851   budesonide (PULMICORT) nebulizer solution 0.25 mg  0.25 mg Nebulization BID Cristopher Peru, PA-C   0.25 mg at 04/02/23 0745   Chlorhexidine Gluconate Cloth 2 % PADS 6 each  6 each Topical Daily Cristopher Peru, PA-C   6 each at 04/01/23 0800   feeding supplement (ENSURE ENLIVE / ENSURE PLUS) liquid 237 mL  237 mL Oral BID BM Maretta Bees, MD   237 mL at 04/02/23 0856   guaiFENesin (MUCINEX) 12 hr tablet 600 mg  600 mg Oral BID Maretta Bees, MD   600 mg at 04/02/23 0850   heparin injection 5,000 Units  5,000 Units Subcutaneous Q8H Lewayne Bunting, MD   5,000 Units at 04/02/23 0616   insulin aspart (novoLOG) injection 0-9 Units  0-9 Units Subcutaneous Q4H Calton Dach I, RPH   1 Units at 04/02/23 0134   ipratropium-albuterol (DUONEB) 0.5-2.5 (3) MG/3ML nebulizer solution 3 mL  3 mL Nebulization BID Leroy Sea, MD       Oral care mouth rinse  15 mL Mouth Rinse Q2H [name], Lauren E, PA-C   15 mL at 04/02/23 8413   Oral care mouth rinse  15 mL Mouth Rinse PRN Cristopher Peru, PA-C       polyethylene glycol (MIRALAX / GLYCOLAX) packet 17 g  17 g Oral Daily PRN Calton Dach I, RPH       revefenacin (YUPELRI) nebulizer solution 175 mcg  175 mcg Nebulization Daily Cristopher Peru, PA-C   175 mcg at 04/02/23 0745   sodium chloride flush (NS) 0.9 %  injection 10-40 mL  10-40 mL Intracatheter PRN Maretta Bees, MD       traZODone (DESYREL) tablet 50 mg  50 mg Oral Once Howerter, Justin B, DO       traZODone (DESYREL) tablet 50 mg  50 mg Oral QHS PRN Alberteen Sam, MD   50 mg at 03/31/23 2159     Discharge Medications: Please see discharge summary for a list of discharge medications.  Relevant Imaging Results:  Relevant Lab Results:   Additional Information SSN [id number]  Mearl Latin, LCSW

## 2023-04-02 NOTE — Progress Notes (Signed)
Progress Note   Patient: Courtney [name]:[id number] DOB: 12-26-1945 DOA: 03/26/2023     7 DOS: the patient was seen and examined on 04/02/2023 at 12:50PM      Brief hospital course: 77 y.o. F with obesity, bed bound, dementia, CHF, COPD on home O2, DM, HTN, and CAD s/p remote PCI who presented with purported cardiac arrest from SNF.  Admitted to ICU on pressors after CPR.  See longer summary from Dr. Jerral Ralph from 11/29    Subjective: Patient in bed, appears comfortable, denies any headache, no fever, no chest pain or pressure, no shortness of breath , no abdominal pain. No new focal weakness.  Assessment and Plan:  * Cardiac arrest (HCC) -   Unclear circumstances surrounding arrest/CPR.  Sinus rhythm here, QTc normal by manual measurement by Cardiology.  Even if true cardiac arrest, Cardiology recommend against catheterization given poor functional and bedbound status with underlying dementia.    Echo done by cardiology showed a EF of 50% with apical hypokinesis, per cardiology not a candidate for invasive testing or procedures.  Continue aspirin and statin for secondary prevention.  Avoiding beta-blocker given history of Mobitz 1 second-degree heart block.   Cardiogenic shock (HCC) Central line in place -- IV team unable to find appropriate PIV access Initially required Levophed in the ICU.  No suspicion for sepsis.  BP now improved, will discontinue central line a peripheral IV can be obtained.   Acute on chronic respiratory failure with hypoxia due to aspiration pneumonia and cardiac arrest (HCC) On home O2 for last few years.  Here, intubated initially, now weaned back to home O2.  Still quite symptomatic, from pneumonia, COPD, pain, anxiety.   Anemia of chronic disease.  1 unit of packed RBC transfusion 04/01/2023.  No signs of acute bleeding.    COPD with acute exacerbation (HCC) - Continue ICS/LABA/LAMA - Scheduled SABA/SAMA - Continue antibiotics - Hold systemic  steroids for now  Dementia without behavioral disturbance (HCC) - Resume sertraline  Hypertension BP improved - Hold metoprolol, furosemide PRN  Diabetes mellitus due to underlying condition, uncontrolled, with long-term current use of insulin (HCC) - Continue SS corrections - Hold metformin  Abnormal imaging of thyroid - Outpatient thyroid US per GOC  Prolonged QT interval ECG repeat shows normal QT interval.  Second degree AV block, Mobitz type I - Avoid BB  Shock liver LFTs trending down.  Demand ischemia (HCC)   AKI (acute kidney injury) (HCC) on CKD 3A.  Baseline creatinine around 1.2. Due to cardiogenic shock, improving, 1 unit of packed RBC on 04/01/2023, bladder scan stable.  Continue to monitor.  Morbid obesity BMI 36.2 in setting of comorbid HTN, DM, OSA  OSA and COPD overlap syndrome (HCC) Used to be on CPAP, not any more, repeat PSG pending  Coronary artery disease See above - Continue aspirin, statin  HLD (hyperlipidemia) - Continue atorvastatin  Normocytic anemia Hgb stable at 8.8 g/dL, no bleeding.   - Check iron, B12  HX of colostomy.  Supportive care.  Physical Exam: BP (!) 129/56 (BP Location: Left Arm)   Pulse (!) 107   Temp 98.2 F (36.8 C) (Axillary)   Resp (!) 21   Ht 5\' 3"  (1.6 m)   Wt 92.9 kg   SpO2 99%   BMI 36.28 kg/m    Awake Alert, No new F.N deficits, Normal affect [place].AT,PERRAL Supple Neck, No JVD,   Symmetrical Chest wall movement, Good air movement bilaterally, few rales RRR,No Gallops, Rubs or new  Murmurs,  +ve B.Sounds, Abd Soft, No tenderness, colostomy site stable. No Cyanosis, Clubbing or edema    Data Reviewed:   arformoterol  15 mcg Nebulization BID   aspirin  81 mg Oral Daily   atorvastatin  80 mg Oral Daily   budesonide (PULMICORT) nebulizer solution  0.25 mg Nebulization BID   Chlorhexidine Gluconate Cloth  6 each Topical Daily   feeding supplement  237 mL Oral BID BM   guaiFENesin  600 mg Oral BID    heparin injection (subcutaneous)  5,000 Units Subcutaneous Q8H   insulin aspart  0-9 Units Subcutaneous Q4H   ipratropium-albuterol  3 mL Nebulization BID   mouth rinse  15 mL Mouth Rinse Q2H   revefenacin  175 mcg Nebulization Daily   traZODone  50 mg Oral Once    Recent Labs  Lab 03/29/23 0355 03/30/23 0426 03/31/23 0237 04/01/23 0305 04/02/23 0239  WBC 13.1* 10.6* 10.5 11.9* 12.3*  HGB 8.1* 8.3* 8.2* 7.7* 8.9*  HCT 28.4* 29.6* 29.9* 27.5* 30.6*  PLT 179 157 185 201 209  MCV 86.6 90.2 90.3 89.6 90.0  MCH 24.7* 25.3* 24.8* 25.1* 26.2  MCHC 28.5* 28.0* 27.4* 28.0* 29.1*  RDW 15.1 15.0 15.0 15.0 15.5  LYMPHSABS  --   --   --   --  2.0  MONOABS  --   --   --   --  1.1*  EOSABS  --   --   --   --  1.4*  BASOSABS  --   --   --   --  0.1    Recent Labs  Lab 03/26/23 1135 03/26/23 1136 03/26/23 1136 03/26/23 1144 03/26/23 1348 03/26/23 1426 03/26/23 1438 03/27/23 0043 03/27/23 1128 03/28/23 0503 03/29/23 0355 03/29/23 0357 03/30/23 0426 03/30/23 0428 03/31/23 0237 04/01/23 0305 04/01/23 0731 04/02/23 0239  NA  --  144   < > 144   < >  --  144 143   < > 143  --  140  --  143 146* 143  --  144  K  --  4.2   < > 4.3   < >  --  3.7 3.0*   < > 3.6  --  4.2  --  4.4 4.8 4.0  --  3.9  CL  --  101   < > 101  --   --   --  104   < > 104  --  104  --  108 110 106  --  107  CO2  --  28   < >  --   --   --   --  30   < > 27  --  30  --  25 29 30   --  32  ANIONGAP  --  15   < >  --   --   --   --  9   < > 12  --  6  --  10 7 7   --  5  GLUCOSE  --  188*   < > 188*  --   --   --  117*   < > 176*  --  117*  --  124* 125* 136*  --  103*  BUN  --  17   < > 22  --   --   --  19   < > 23  --  22  --  20 18 17   --  14  CREATININE  --  1.08*   < > 1.10*  --  1.05*  --  0.92   < > 1.01*  --  1.84*  --  1.37* 1.31* 1.23*  --  1.13*  AST  --  704*  --   --   --   --   --  540*  --  99* 42*  --  30  --   --   --   --   --   ALT  --  729*  --   --   --   --   --  555*  --  285* 205*  --   147*  --   --   --   --   --   ALKPHOS  --  167*  --   --   --   --   --  138*  --  105 117  --  120  --   --   --   --   --   BILITOT  --  0.7  --   --   --   --   --  0.7  --  0.6 0.6  --  0.6  --   --   --   --   --   ALBUMIN  --  3.3*   < >  --   --   --   --  3.0*  3.0*   < > 2.5*  2.5* 2.8* 2.9* 3.0* 3.0* 3.0* 2.8*  --   --   PROCALCITON  --   --   --   --   --  0.49  --   --   --   --   --   --   --   --   --   --   --   --   LATICACIDVEN  --   --   --  6.8*  --   --  2.7*  --   --   --   --   --   --   --   --   --   --   --   HGBA1C 6.1*  --   --   --   --   --   --   --   --   --   --   --   --   --   --   --   --   --   BNP  --   --   --   --   --  202.9*  --   --   --   --   --   --   --   --   --   --  293.8* 219.6*  MG  --   --   --   --    < > 1.1*  --  3.2*   < > 2.5* 2.5*  --  2.4  --  2.4  --   --  1.9  CALCIUM  --  9.4   < >  --   --   --   --  9.1   < > 8.3*  --  8.8*  --  8.9 9.2 9.2  --  9.2   < > = values in this interval not displayed.     Recent Labs  Lab 03/26/23 1135 03/26/23 1136 03/26/23 1144 03/26/23 1426 03/26/23 1438 03/27/23 0043 03/28/23 0503 03/29/23 0355 03/29/23 0357 03/30/23 0426 03/30/23 0428 03/31/23  5784 04/01/23 0305 04/01/23 0731 04/02/23 0239  PROCALCITON  --   --   --  0.49  --   --   --   --   --   --   --   --   --   --   --   LATICACIDVEN  --   --  6.8*  --  2.7*  --   --   --   --   --   --   --   --   --   --   HGBA1C 6.1*  --   --   --   --   --   --   --   --   --   --   --   --   --   --   BNP  --   --   --  202.9*  --   --   --   --   --   --   --   --   --  293.8* 219.6*  MG  --   --   --  1.1*  --    < > 2.5* 2.5*  --  2.4  --  2.4  --   --  1.9  CALCIUM  --    < >  --   --   --    < > 8.3*  --  8.8*  --  8.9 9.2 9.2  --  9.2   < > = values in this interval not displayed.    No results for input(s): "TSH", "T4TOTAL", "FREET4", "T3FREE", "THYROIDAB" in the last 72 hours.  Recent Labs    04/01/23 0305  VITAMINB12 795   FERRITIN 139  TIBC 272  IRON 43   Family Communication: None present  Disposition: Status is: Inpatient     Signature  -    Susa Raring M.D on 04/02/2023 at 10:05 AM   -  To page go to [email or web address]

## 2023-04-02 NOTE — Code Documentation (Signed)
Received page for code blue at 2122 and responded. On arrival to the room pt had already regained pulse and was responsive, albeit still lethargic and not as responsive as she was previously. Essentially, she had received breathing treatment with Brovana at 2100, and at 2117 went into asystole for about 10-15 seconds. She was subsequently connected back to oxygen with non-rebreather and her pulse recovered.   On tele review she is currently in sinus rhythm, with an elevated blood pressure. No compressions or medications were administered.  Etiology of asystole unclear at this time, she was admitted for cardiac arrest at her SNF sand does have a history of Mobitz 1 heart block, so at baseline her heart rhythm is weak. Rosalyn Gess does have a side effect of causing arrythmias, so may be due to that medication. Would recommend holding off on breathing treatment with beta-adrenergic effects at this time.

## 2023-04-02 NOTE — Care Management Important Message (Signed)
Important Message  Patient Details  Name: Courtney [name] MRN: [id number] Date of Birth: 16-Jul-1945   Important Message Given:  Yes - Medicare IM     Dorena Bodo 04/02/2023, 3:54 PM

## 2023-04-03 ENCOUNTER — Encounter (HOSPITAL_COMMUNITY): Payer: Self-pay | Admitting: *Deleted

## 2023-04-03 ENCOUNTER — Emergency Department (HOSPITAL_COMMUNITY)
Admission: EM | Admit: 2023-04-03 | Discharge: 2023-05-02 | Disposition: E | Payer: Medicare (Managed Care) | Attending: Emergency Medicine | Admitting: Emergency Medicine

## 2023-04-03 ENCOUNTER — Other Ambulatory Visit: Payer: Self-pay

## 2023-04-03 ENCOUNTER — Emergency Department (HOSPITAL_COMMUNITY): Payer: Medicare (Managed Care)

## 2023-04-03 ENCOUNTER — Inpatient Hospital Stay (HOSPITAL_COMMUNITY): Payer: Medicare (Managed Care)

## 2023-04-03 DIAGNOSIS — J45909 Unspecified asthma, uncomplicated: Secondary | ICD-10-CM | POA: Diagnosis not present

## 2023-04-03 DIAGNOSIS — I10 Essential (primary) hypertension: Secondary | ICD-10-CM | POA: Diagnosis not present

## 2023-04-03 DIAGNOSIS — J449 Chronic obstructive pulmonary disease, unspecified: Secondary | ICD-10-CM | POA: Diagnosis not present

## 2023-04-03 DIAGNOSIS — I469 Cardiac arrest, cause unspecified: Secondary | ICD-10-CM | POA: Diagnosis present

## 2023-04-03 DIAGNOSIS — Z79899 Other long term (current) drug therapy: Secondary | ICD-10-CM | POA: Insufficient documentation

## 2023-04-03 DIAGNOSIS — Z7982 Long term (current) use of aspirin: Secondary | ICD-10-CM | POA: Diagnosis not present

## 2023-04-03 DIAGNOSIS — E119 Type 2 diabetes mellitus without complications: Secondary | ICD-10-CM | POA: Insufficient documentation

## 2023-04-03 DIAGNOSIS — Z794 Long term (current) use of insulin: Secondary | ICD-10-CM | POA: Insufficient documentation

## 2023-04-03 DIAGNOSIS — I2510 Atherosclerotic heart disease of native coronary artery without angina pectoris: Secondary | ICD-10-CM | POA: Diagnosis not present

## 2023-04-03 DIAGNOSIS — Z7984 Long term (current) use of oral hypoglycemic drugs: Secondary | ICD-10-CM | POA: Diagnosis not present

## 2023-04-03 LAB — I-STAT ARTERIAL BLOOD GAS, ED
Acid-base deficit: 6 mmol/L — ABNORMAL HIGH (ref 0.0–2.0)
Bicarbonate: 26.3 mmol/L (ref 20.0–28.0)
Calcium, Ion: 1.31 mmol/L (ref 1.15–1.40)
HCT: 33 % — ABNORMAL LOW (ref 36.0–46.0)
Hemoglobin: 11.2 g/dL — ABNORMAL LOW (ref 12.0–15.0)
O2 Saturation: 100 %
Potassium: 4.6 mmol/L (ref 3.5–5.1)
Sodium: 145 mmol/L (ref 135–145)
TCO2: 29 mmol/L (ref 22–32)
pCO2 arterial: 103.3 mm[Hg] (ref 32–48)
pH, Arterial: 7.014 — CL (ref 7.35–7.45)
pO2, Arterial: 261 mm[Hg] — ABNORMAL HIGH (ref 83–108)

## 2023-04-03 LAB — CBC WITH DIFFERENTIAL/PLATELET
Abs Immature Granulocytes: 0.23 10*3/uL — ABNORMAL HIGH (ref 0.00–0.07)
Abs Immature Granulocytes: 1.3 10*3/uL — ABNORMAL HIGH (ref 0.00–0.07)
Basophils Absolute: 0 10*3/uL (ref 0.0–0.1)
Basophils Absolute: 0.3 10*3/uL — ABNORMAL HIGH (ref 0.0–0.1)
Basophils Relative: 0 %
Basophils Relative: 1 %
Eosinophils Absolute: 1.2 10*3/uL — ABNORMAL HIGH (ref 0.0–0.5)
Eosinophils Absolute: 1 10*3/uL — ABNORMAL HIGH (ref 0.0–0.5)
Eosinophils Relative: 8 %
Eosinophils Relative: 3 %
HCT: 32 % — ABNORMAL LOW (ref 36.0–46.0)
HCT: 35.4 % — ABNORMAL LOW (ref 36.0–46.0)
Hemoglobin: 9.3 g/dL — ABNORMAL LOW (ref 12.0–15.0)
Hemoglobin: 9.4 g/dL — ABNORMAL LOW (ref 12.0–15.0)
Immature Granulocytes: 2 %
Lymphocytes Relative: 16 %
Lymphocytes Relative: 25 %
Lymphs Abs: 2.2 10*3/uL (ref 0.7–4.0)
Lymphs Abs: 8.2 10*3/uL — ABNORMAL HIGH (ref 0.7–4.0)
MCH: 26.3 pg (ref 26.0–34.0)
MCH: 25.3 pg — ABNORMAL LOW (ref 26.0–34.0)
MCHC: 29.1 g/dL — ABNORMAL LOW (ref 30.0–36.0)
MCHC: 26.6 g/dL — ABNORMAL LOW (ref 30.0–36.0)
MCV: 90.4 fL (ref 80.0–100.0)
MCV: 95.2 fL (ref 80.0–100.0)
Metamyelocytes Relative: 2 %
Monocytes Absolute: 1 10*3/uL (ref 0.1–1.0)
Monocytes Absolute: 1 10*3/uL (ref 0.1–1.0)
Monocytes Relative: 8 %
Monocytes Relative: 3 %
Myelocytes: 2 %
Neutro Abs: 9.1 10*3/uL — ABNORMAL HIGH (ref 1.7–7.7)
Neutro Abs: 21.1 10*3/uL — ABNORMAL HIGH (ref 1.7–7.7)
Neutrophils Relative %: 66 %
Neutrophils Relative %: 64 %
Platelets: 232 10*3/uL (ref 150–400)
Platelets: 409 10*3/uL — ABNORMAL HIGH (ref 150–400)
RBC: 3.54 MIL/uL — ABNORMAL LOW (ref 3.87–5.11)
RBC: 3.72 MIL/uL — ABNORMAL LOW (ref 3.87–5.11)
RDW: 15.3 % (ref 11.5–15.5)
RDW: 15.4 % (ref 11.5–15.5)
WBC: 13.8 10*3/uL — ABNORMAL HIGH (ref 4.0–10.5)
WBC: 32.9 10*3/uL — ABNORMAL HIGH (ref 4.0–10.5)
nRBC: 0.2 % (ref 0.0–0.2)
nRBC: 1.9 % — ABNORMAL HIGH (ref 0.0–0.2)
nRBC: 3 /100{WBCs} — ABNORMAL HIGH

## 2023-04-03 LAB — COMPREHENSIVE METABOLIC PANEL
ALT: 414 U/L — ABNORMAL HIGH (ref 0–44)
AST: 647 U/L — ABNORMAL HIGH (ref 15–41)
Albumin: 2.5 g/dL — ABNORMAL LOW (ref 3.5–5.0)
Alkaline Phosphatase: 295 U/L — ABNORMAL HIGH (ref 38–126)
Anion gap: 14 (ref 5–15)
BUN: 13 mg/dL (ref 8–23)
CO2: 25 mmol/L (ref 22–32)
Calcium: 9.1 mg/dL (ref 8.9–10.3)
Chloride: 106 mmol/L (ref 98–111)
Creatinine, Ser: 1.31 mg/dL — ABNORMAL HIGH (ref 0.44–1.00)
GFR, Estimated: 41 mL/min — ABNORMAL LOW (ref >60)
Glucose, Bld: 222 mg/dL — ABNORMAL HIGH (ref 70–99)
Potassium: 4.9 mmol/L (ref 3.5–5.1)
Sodium: 145 mmol/L (ref 135–145)
Total Bilirubin: 0.6 mg/dL (ref <1.2)
Total Protein: 5.6 g/dL — ABNORMAL LOW (ref 6.5–8.1)

## 2023-04-03 LAB — CBG MONITORING, ED: Glucose-Capillary: 169 mg/dL — ABNORMAL HIGH (ref 70–99)

## 2023-04-03 LAB — GLUCOSE, CAPILLARY
Glucose-Capillary: 127 mg/dL — ABNORMAL HIGH (ref 70–99)
Glucose-Capillary: 132 mg/dL — ABNORMAL HIGH (ref 70–99)
Glucose-Capillary: 138 mg/dL — ABNORMAL HIGH (ref 70–99)

## 2023-04-03 LAB — BASIC METABOLIC PANEL
Anion gap: 8 (ref 5–15)
BUN: 13 mg/dL (ref 8–23)
CO2: 29 mmol/L (ref 22–32)
Calcium: 9.3 mg/dL (ref 8.9–10.3)
Chloride: 105 mmol/L (ref 98–111)
Creatinine, Ser: 0.9 mg/dL (ref 0.44–1.00)
GFR, Estimated: >60 mL/min (ref >60)
Glucose, Bld: 145 mg/dL — ABNORMAL HIGH (ref 70–99)
Potassium: 4.1 mmol/L (ref 3.5–5.1)
Sodium: 142 mmol/L (ref 135–145)

## 2023-04-03 LAB — CULTURE, RESPIRATORY W GRAM STAIN

## 2023-04-03 LAB — MAGNESIUM: Magnesium: 1.9 mg/dL (ref 1.7–2.4)

## 2023-04-03 LAB — LACTIC ACID, PLASMA: Lactic Acid, Venous: 7.2 mmol/L (ref 0.5–1.9)

## 2023-04-03 LAB — I-STAT CG4 LACTIC ACID, ED: Lactic Acid, Venous: 7.6 mmol/L (ref 0.5–1.9)

## 2023-04-03 LAB — TROPONIN I (HIGH SENSITIVITY): Troponin I (High Sensitivity): 271 ng/L (ref <18)

## 2023-04-03 LAB — BRAIN NATRIURETIC PEPTIDE: B Natriuretic Peptide: 395.5 pg/mL — ABNORMAL HIGH (ref 0.0–100.0)

## 2023-04-03 LAB — PHOSPHORUS: Phosphorus: 3.5 mg/dL (ref 2.5–4.6)

## 2023-04-03 MED ORDER — DOXYCYCLINE HYCLATE 100 MG PO TABS
100.0000 mg | ORAL_TABLET | Freq: Two times a day (BID) | ORAL | Status: DC
  Administered 2023-04-03: 100 mg via ORAL
  Filled 2023-04-03: qty 1

## 2023-04-03 MED ORDER — VASOPRESSIN 20 UNITS/100 ML INFUSION FOR SHOCK: 0.0000 [IU]/min | INTRAVENOUS | Status: DC

## 2023-04-03 MED ORDER — VASOPRESSIN 20 UNITS/100 ML INFUSION FOR SHOCK
INTRAVENOUS | Status: AC
  Administered 2023-04-03: 0.04 [IU]/h via INTRAVENOUS
  Filled 2023-04-03: qty 100

## 2023-04-03 MED ORDER — FUROSEMIDE 10 MG/ML IJ SOLN
40.0000 mg | Freq: Once | INTRAMUSCULAR | Status: AC
  Administered 2023-04-03: 40 mg via INTRAVENOUS
  Filled 2023-04-03: qty 4

## 2023-04-03 MED ORDER — SODIUM BICARBONATE 8.4 % IV SOLN
INTRAVENOUS | Status: AC | PRN
  Administered 2023-04-03: 50 meq via INTRAVENOUS

## 2023-04-03 MED ORDER — HUMALOG 100 UNIT/ML ~~LOC~~ SOCT: SUBCUTANEOUS | 0 refills | Status: DC

## 2023-04-03 MED ORDER — EPINEPHRINE HCL 5 MG/250ML IV SOLN IN NS
0.5000 ug/min | INTRAVENOUS | Status: DC
  Administered 2023-04-03: 10 ug/min via INTRAVENOUS

## 2023-04-03 MED ORDER — DOXYCYCLINE HYCLATE 100 MG PO TABS: 100.0000 mg | ORAL_TABLET | Freq: Two times a day (BID) | ORAL | Status: DC

## 2023-04-03 MED ORDER — NOREPINEPHRINE 4 MG/250ML-% IV SOLN
0.0000 ug/min | INTRAVENOUS | Status: DC
Start: 2023-04-03 — End: 2023-04-03
  Administered 2023-04-03: 10 ug/min via INTRAVENOUS

## 2023-04-03 MED ORDER — EPINEPHRINE 1 MG/10ML IJ SOSY
PREFILLED_SYRINGE | INTRAMUSCULAR | Status: AC | PRN
  Administered 2023-04-03: 1 mg via INTRAVENOUS

## 2023-04-03 MED ORDER — SODIUM CHLORIDE 0.9 % IV SOLN
1.0000 g | Freq: Every day | INTRAVENOUS | Status: DC
  Administered 2023-04-03: 1 g via INTRAVENOUS
  Filled 2023-04-03: qty 10

## 2023-04-03 MED ORDER — CEPHALEXIN 500 MG PO CAPS: 500.0000 mg | ORAL_CAPSULE | Freq: Three times a day (TID) | ORAL | Status: DC

## 2023-04-20 ENCOUNTER — Ambulatory Visit: Payer: Medicare (Managed Care) | Admitting: Physician Assistant

## 2023-05-02 NOTE — ED Notes (Signed)
Abd xray complete

## 2023-05-02 NOTE — Discharge Summary (Addendum)
Courtney [name] BJY:[id number] DOB: [birth year] DOA: 03/26/2023  PCP: Sherron Monday, MD  Admit date: 03/26/2023  Discharge date: 04/20/2023  Admitted From: SNF   Disposition:  SNF   Recommendations for Outpatient Follow-up:   Follow up with PCP in 1-2 weeks  PCP Please obtain BMP/CBC, 2 view CXR in 1week,  (see Discharge instructions)   PCP Please follow up on the following pending results: Needs close outpatient cardiology follow-up in 1 to 2 weeks.  Do not use nodal blocking agents like calcium channel blockers, beta-blockers.   Home Health: None   Equipment/Devices: None  Consultations: None  Discharge Condition: Stable    CODE STATUS: Full    Diet Recommendation: Heart Healthy low carbohydrate diet, 1.5 L fluid restriction per day.  Check CBGs q. ACH S.    No chief complaint on file.    Brief history of present illness from the day of admission and additional interim summary     78 y.o.  female with history of COPD, DM-2, HTN, HFpEF, CAD s/p PCI 2011-SNF resident-mostly bed to wheelchair bound (has not ambulated for a year)- brought in to the ED following a cardiac arrest.  She was intubated for airway protection-started on Levophed for hypotension-and then subsequently admitted to the ICU.  She was found to have wall motion abnormalities on echocardiogram, EKG showed QTc prolongation and Mobitz type I AV block-she was stabilized-extubated and transferred to [facility] on 11/28.     Significant events: 11/25>> admit to PCCM for cardiac arrest  11/27>> extubated 11/28>> transferred to [facility].   Significant studies: 11/25>> CXR: Asymmetric pulm edema/right basilar airspace disease. 11/25>> CT head: No acute intracranial abnormalities 11/25>> CT C-spine: No fracture. 11/25>> CT chest/abdomen/pelvis: Pulmonary  edema, small right/moderate left pleural effusion 11/25>> echo: EF 50% severe hypokinesis of apical/septal wall. 11/26>> B/L lower extremity Doppler: No DVT 11/28>> renal ultrasound: No hydronephrosis   Significant microbiology data: 11/25>> respiratory virus panel: Negative 11/25>> blood culture: No growth     Procedures: 11/25-11/27>> ETT   Consults: PCCM Cardiology                                                                 Hospital Course   Cardiac arrest [facility]) -    Unclear circumstances surrounding arrest/CPR.  Sinus rhythm here, QTc normal by manual measurement by Cardiology.  Even if true cardiac arrest, Cardiology recommend against catheterization given poor functional and bedbound status with underlying dementia.  She has chronic hypoxic respiratory failure and is 2 to 3 L nasal cannula oxygen bound and very sensitive to oxygen removal.   Echo done by cardiology showed a EF of 50% with apical hypokinesis, per cardiology not a candidate for invasive testing or procedures.  Continue aspirin and statin for secondary prevention.  Avoiding beta-blocker given history  of Mobitz 1 second-degree heart block.  Per cardiology outpatient cardiology follow-up.  Patient desired noninvasive treatment only per cardiology.   Note most of her cardiac events seem to be precipitated by oxygen supply being interrupted causing hypoxia and precipitation of her underlying heart block.     Cardiogenic shock (HCC) Central line in place -- IV team unable to find appropriate PIV access Initially required Levophed in the ICU.  No suspicion for sepsis.  BP now improved, and currently stable without any blood pressure medications.  Central line will be discontinued prior to discharge     Acute on chronic respiratory failure with hypoxia due to aspiration pneumonia and cardiac arrest (HCC) On home O2 for last few years.  Here, intubated initially, now weaned back to home O2.  Still quite symptomatic,  from pneumonia, COPD, pain, anxiety.  Patient throughout observed on 3 L nasal cannula oxygen resting comfortably in bed with nurse [name] on 04/23/2023 at 10 AM, no desaturations, completely symptom-free, stable on telemetry this morning.  SpO2: 100 % O2 Flow Rate (L/min): 3 L/min FiO2 (%): 40 %     Mild dysuria and nonspecific leukocytosis.  Tracheal aspirate likely showing colonization.  Dysuria improved after pure wick was removed, 5 days of empiric Keflex and doxycycline stop date 04/08/2023.    Anemia of chronic disease.  1 unit of packed RBC transfusion 04/01/2023.  No signs of acute bleeding.     COPD with acute exacerbation (HCC) - Continue ICS/LABA/LAMA - Scheduled SABA/SAMA - Continue antibiotics - Hold systemic steroids for now   Dementia without behavioral disturbance (HCC) - Resume sertraline   Hypertension BP stable currently off of blood pressure medications - Hold metoprolol, furosemide as before.   Diabetes mellitus due to underlying condition, uncontrolled, with long-term current use of insulin (HCC) - Continue SS corrections - Hold metformin, check CBGs q. ACH S.   Abnormal imaging of thyroid - Outpatient thyroid US per PCP/SNF MD.   Prolonged QT interval ECG repeat shows normal QT interval.   Second degree AV block, Mobitz type I - Avoid BB and other nodal blocking drugs.  Norvasc is fine but Cardizem should be avoided.   Shock liver LFTs trending down.   Demand ischemia (HCC) kindly see above.   AKI (acute kidney injury) (HCC) on CKD 3A.  Baseline creatinine around 1.2. Due to cardiogenic shock, improving, 1 unit of packed RBC on 04/01/2023, bladder scan stable.  AKI much improved and now close to baseline.   Morbid obesity BMI 36.2 in setting of comorbid HTN, DM, OSA, follow-up with PCP.   OSA and COPD overlap syndrome (HCC) Used to be on CPAP, not any more, use 2 L nasal cannula oxygen 24/7 including night, outpatient sleep study if desired.    Coronary artery disease See above - Continue aspirin, statin   HLD (hyperlipidemia) - Continue atorvastatin   Normocytic anemia Hgb stable at 8.8 g/dL, no bleeding.  PCP to monitor anemia panel outpatient.     HX of colostomy.  Supportive care.    Discharge diagnosis     Principal Problem:   Cardiac arrest [facility]) Active Problems:   Acute on chronic respiratory failure with hypoxia due to aspiration pneumonia and cardiac arrest (HCC)   COPD with acute exacerbation (HCC)   Normocytic anemia   HLD (hyperlipidemia)   Coronary artery disease   OSA and COPD overlap syndrome (HCC)   Diabetes mellitus due to underlying condition, uncontrolled, with long-term current use of insulin (HCC)  Hypertension   Morbid obesity   Dementia without behavioral disturbance (HCC)   AKI (acute kidney injury) (HCC)   Cardiogenic shock (HCC)   Demand ischemia (HCC)   Shock liver   Second degree AV block, Mobitz type I   Prolonged QT interval   Abnormal imaging of thyroid    Discharge instructions    Discharge Instructions     Discharge instructions   Complete by: As directed    Follow with Primary MD Sherron Monday, MD in 7 days   Get CBC, CMP, 2 view Chest X ray -  checked next visit with your primary MD   Activity: As tolerated with Full fall precautions use walker/cane & assistance as needed  Disposition Home    Diet: Heart Healthy Low Carb, 1.5 L fluid restriction per day.  Check CBGs q. ACH S.   Check your Weight same time everyday, if you gain over 2 pounds, or you develop in leg swelling, experience more shortness of breath or chest pain, call your Primary MD immediately. Follow Cardiac Low Salt Diet and 1.5 lit/day fluid restriction.  Special Instructions: If you have smoked or chewed Tobacco  in the last 2 yrs please stop smoking, stop any regular Alcohol  and or any Recreational drug use.  On your next visit with your primary care physician please Get Medicines  reviewed and adjusted.  Please request your Prim.MD to go over all Hospital Tests and Procedure/Radiological results at the follow up, please get all Hospital records sent to your Prim MD by signing hospital release before you go home.  If you experience worsening of your admission symptoms, develop shortness of breath, life threatening emergency, suicidal or homicidal thoughts you must seek medical attention immediately by calling 911 or calling your MD immediately  if symptoms less severe.  You Must read complete instructions/literature along with all the possible adverse reactions/side effects for all the Medicines you take and that have been prescribed to you. Take any new Medicines after you have completely understood and accpet all the possible adverse reactions/side effects.   Do not drive when taking Pain medications.  Do not take more than prescribed Pain, Sleep and Anxiety Medications   Increase activity slowly   Complete by: As directed    No wound care   Complete by: As directed        Discharge Medications   Allergies as of 04/14/2023       Reactions   Egg-derived Products Swelling   Listed on [facility] 07/04/21 Patient reports had face swelling.    Penicillins Other (See Comments)        Medication List     STOP taking these medications    hydrOXYzine 25 MG tablet Commonly known as: ATARAX   metoprolol succinate 25 MG 24 hr tablet Commonly known as: TOPROL-XL       TAKE these medications    acetaminophen 325 MG tablet Commonly known as: TYLENOL Take 650 mg by mouth every 6 (six) hours as needed for moderate pain (pain score 4-6).   amLODipine 10 MG tablet Commonly known as: NORVASC Take 1 tablet (10 mg total) by mouth daily.   aspirin EC 81 MG tablet Take 1 tablet (81 mg total) by mouth daily.   atorvastatin 80 MG tablet Commonly known as: LIPITOR Take 80 mg by mouth at bedtime.   Bevespi Aerosphere 9-4.8 MCG/ACT Aero Generic drug:  Glycopyrrolate-Formoterol Inhale 2 puffs into the lungs 2 (two) times daily.   cephALEXin 500 MG  capsule Commonly known as: KEFLEX Take 1 capsule (500 mg total) by mouth 3 (three) times daily for 5 days.   Cholecalciferol 25 MCG (1000 UT) tablet Take 1,000 Units by mouth in the morning.   doxycycline 100 MG tablet Commonly known as: VIBRA-TABS Take 1 tablet (100 mg total) by mouth every 12 (twelve) hours.   ferrous sulfate 325 (65 FE) MG tablet Take 1 tablet (325 mg total) by mouth 2 (two) times daily with a meal. What changed: when to take this   fluticasone 50 MCG/ACT nasal spray Commonly known as: FLONASE Place 1 spray into both nostrils daily.   furosemide 40 MG tablet Commonly known as: LASIX Take 1 tablet (40 mg total) by mouth 2 (two) times daily. What changed: when to take this   GlucaGen HypoKit 1 MG Solr injection Generic drug: glucagon Inject 1 mg into the muscle daily as needed for low blood sugar.   HumaLOG 100 UNIT/ML cartridge Generic drug: insulin lispro Before each meal 3 times a day, 140-199 - 2 units, 200-250 - 4 units, 251-299 - 6 units,  300-349 - 8 units,  350 or above 10 units.   ipratropium-albuterol 0.5-2.5 (3) MG/3ML Soln Commonly known as: DUONEB Take 3 mLs by nebulization every 6 (six) hours as needed. What changed: when to take this   Melatonin 10 MG Tabs Take 10 mg by mouth at bedtime.   metFORMIN 500 MG tablet Commonly known as: GLUCOPHAGE Take 500 mg by mouth 2 (two) times daily with a meal.   nitroGLYCERIN 0.4 MG SL tablet Commonly known as: NITROSTAT Place 0.4 mg under the tongue every 5 (five) minutes as needed for chest pain.   NUTRITIONAL DRINK PO Take 1 each by mouth 2 (two) times daily.   OXYGEN Inhale 2 L into the lungs continuous.   polyethylene glycol 17 g packet Commonly known as: MIRALAX / GLYCOLAX Take 17 g by mouth daily.   saccharomyces boulardii 250 MG capsule Commonly known as: FLORASTOR Take 250 mg by  mouth 2 (two) times daily.   senna 8.6 MG Tabs tablet Commonly known as: SENOKOT Take 2 tablets by mouth 2 (two) times daily.   sertraline 50 MG tablet Commonly known as: ZOLOFT Take 50 mg by mouth daily.         Follow-up Information     Azalee Course, [place] Follow up on 04/20/2023.   Specialties: Cardiology, Radiology Why: 8:50AM. Post hospital cardiology follow up Contact information: [address] [place] [address] [phone]         Sherron Monday, MD. Schedule an appointment as soon as possible for a visit in 1 week(s).   Specialty: Internal Medicine Contact information: [address] [place] [address] [phone]                 Major procedures and Radiology Reports - PLEASE review detailed and final reports thoroughly  -      DG Chest [facility] 1 View  Result Date: 04/02/2023 CLINICAL DATA:  Shortness of breath. EXAM: PORTABLE CHEST 1 VIEW COMPARISON:  03/27/2023 FINDINGS: Low volume film. The cardio pericardial silhouette is enlarged. Vascular congestion with diffuse interstitial opacity suggests edema. Bibasilar collapse/consolidation with bilateral pleural effusions, progressive in the interval. Left subclavian central line again noted with tip overlying the region of the innominate vein confluence. Telemetry leads overlie the chest. IMPRESSION: Progressive bibasilar collapse/consolidation with bilateral pleural effusions. Cardiomegaly with features suggesting interstitial edema. Electronically Signed   By: Minerva Areola  Molli Posey M.D.   On: 04/02/2023 08:30   US RENAL  Result Date: 03/29/2023 CLINICAL DATA:  Acute kidney insufficiency EXAM: RENAL / URINARY TRACT ULTRASOUND COMPLETE COMPARISON:  Noncontrast CT 03/26/2019 FINDINGS: Right Kidney: Renal measurements: 11.2 x 5.1 x 4.7 cm = volume: 139.9 mL. No collecting system dilatation or perinephric fluid. There is a near anechoic rounded well-defined structure measuring 14 mm towards the  upper pole consistent with a cyst. Left Kidney: Renal measurements: 9.2 x 5.8 x 6.0 cm = volume: 165.7 mL. Echogenicity within normal limits. No mass or hydronephrosis visualized. Bladder: Bladder is underdistended. Other: None. IMPRESSION: No collecting system dilatation. Electronically Signed   By: Karen Kays M.D.   On: 03/29/2023 11:47   VAS [place] LOWER EXTREMITY VENOUS (DVT)  Result Date: 03/27/2023  Lower Venous DVT Study Patient Name:  OTA RACER  Date of Exam:   03/27/2023 Medical Rec #: [id number]        Accession #:    [id number] Date of Birth: [birth year]         Patient Gender: F Patient Age:   71 years Exam Location:  [facility] Procedure:      VAS [place] LOWER EXTREMITY VENOUS (DVT) Referring Phys: Mertha Baars --------------------------------------------------------------------------------  Indications: Edema.  Risk Factors: None identified. Limitations: Poor ultrasound/tissue interface, body habitus and patient positioning. Comparison Study: No prior studies. Performing Technologist: Chanda Busing RVT  Examination Guidelines: A complete evaluation includes B-mode imaging, spectral Doppler, color Doppler, and power Doppler as needed of all accessible portions of each vessel. Bilateral testing is considered an integral part of a complete examination. Limited examinations for reoccurring indications may be performed as noted. The reflux portion of the exam is performed with the patient in reverse Trendelenburg.  +---------+---------------+---------+-----------+----------+--------------+ RIGHT    CompressibilityPhasicitySpontaneityPropertiesThrombus Aging +---------+---------------+---------+-----------+----------+--------------+ CFV      Full           Yes      Yes                                 +---------+---------------+---------+-----------+----------+--------------+ SFJ      Full                                                         +---------+---------------+---------+-----------+----------+--------------+ FV Prox  Full                                                        +---------+---------------+---------+-----------+----------+--------------+ FV Mid   Full                                                        +---------+---------------+---------+-----------+----------+--------------+ FV DistalFull                                                        +---------+---------------+---------+-----------+----------+--------------+  PFV      Full                                                        +---------+---------------+---------+-----------+----------+--------------+ POP      Full           Yes      Yes                                 +---------+---------------+---------+-----------+----------+--------------+ PTV      Full                                                        +---------+---------------+---------+-----------+----------+--------------+ PERO     Full                                                        +---------+---------------+---------+-----------+----------+--------------+   +---------+---------------+---------+-----------+----------+--------------+ LEFT     CompressibilityPhasicitySpontaneityPropertiesThrombus Aging +---------+---------------+---------+-----------+----------+--------------+ CFV      Full           Yes      Yes                                 +---------+---------------+---------+-----------+----------+--------------+ SFJ      Full                                                        +---------+---------------+---------+-----------+----------+--------------+ FV Prox  Full                                                        +---------+---------------+---------+-----------+----------+--------------+ FV Mid   Full                                                         +---------+---------------+---------+-----------+----------+--------------+ FV DistalFull           Yes      Yes                                 +---------+---------------+---------+-----------+----------+--------------+ PFV      Full                                                        +---------+---------------+---------+-----------+----------+--------------+  POP      Full           Yes      Yes                                 +---------+---------------+---------+-----------+----------+--------------+ PTV      Full                                                        +---------+---------------+---------+-----------+----------+--------------+ PERO     Full                                                        +---------+---------------+---------+-----------+----------+--------------+     Summary: RIGHT: - There is no evidence of deep vein thrombosis in the lower extremity. However, portions of this examination were limited- see technologist comments above.  - No cystic structure found in the popliteal fossa.  LEFT: - There is no evidence of deep vein thrombosis in the lower extremity. However, portions of this examination were limited- see technologist comments above.  - No cystic structure found in the popliteal fossa.  *See table(s) above for measurements and observations. Electronically signed by Lemar Livings MD on 03/27/2023 at 2:29:22 PM.    Final    DG CHEST PORT 1 VIEW  Result Date: 03/27/2023 CLINICAL DATA:  Pneumonia, intubated EXAM: PORTABLE CHEST 1 VIEW COMPARISON:  03/26/2023 FINDINGS: Single frontal view of the chest demonstrates endotracheal tube overlying tracheal air column, tip 1.2 cm above carina. Enteric catheter passes below diaphragm tip excluded by collimation. Stable left subclavian central venous catheter tip overlying superior vena cava. The cardiac silhouette remains enlarged. Marked improved aeration of the right upper lobe since prior study.  There are widespread interstitial and ground-glass opacities throughout the lungs, otherwise unchanged. Continued retrocardiac consolidation consistent with atelectasis or dense airspace disease. No effusion or pneumothorax. No acute bony abnormalities. IMPRESSION: 1. Persistent widespread interstitial and ground-glass airspace disease. 2. Persistent retrocardiac consolidation favoring atelectasis. 3. Marked improvement in aeration of the right upper lobe since prior study. Electronically Signed   By: Sharlet Salina M.D.   On: 03/27/2023 09:16   EEG adult  Result Date: 03/26/2023 Charlsie Quest, MD     03/26/2023  7:51 PM Patient Name: JESSENYA VEKSLER MRN: [id number] Epilepsy Attending: Charlsie Quest Referring Physician/Provider: Cristopher Peru, PA-C Date: 03/26/2023 Duration: 22.22 mins Patient history: 78yo F s/p cardiac arrest getting eeg to evaluate for seizure Level of alertness:  comatose AEDs during EEG study: Propofol, Versed Technical aspects: This EEG study was done with scalp electrodes positioned according to the 10-20 International system of electrode placement. Electrical activity was reviewed with band pass filter of 1-70Hz , sensitivity of 7 uV/mm, display speed of 50mm/sec with a 60Hz  notched filter applied as appropriate. EEG data were recorded continuously and digitally stored.  Video monitoring was available and reviewed as appropriate. Description: EEG showed continuous generalized 3 to 6 Hz theta-delta slowing. Patient was noted to have chewing movements at times during the study. Concomitant EEG before, during and after the event did not show any EEG  changes suggest seizure. Hyperventilation and photic stimulation were not performed.   ABNORMALITY - Continuous slow, generalized IMPRESSION: This study is suggestive of moderate to severe diffuse encephalopathy. No seizures or epileptiform discharges were seen throughout the recording. Patient was noted to have chewing movements at  times during the study without concomitant EEG change. These events were NOT epileptic. Priyanka Annabelle Harman   DG CHEST PORT 1 VIEW  Result Date: 03/26/2023 CLINICAL DATA:  Endotracheally intubated, central line placement EXAM: PORTABLE CHEST 1 VIEW COMPARISON:  03/26/2023 FINDINGS: Left central line tip in the SVC. No pneumothorax. Endotracheal tube slightly retracted since prior study, now 2.8 cm above the carina. NG tube is in the stomach. Heart borderline in size. Left lower lobe and right upper lobe airspace opacities more confluent than prior study. Mild vascular congestion and diffuse interstitial prominence. No visible effusions or pneumothorax. IMPRESSION: Left central line tip in the SVC.  No pneumothorax. Left lower lobe and right upper lobe airspace disease could reflect atelectasis/collapse or pneumonia. Vascular congestion and interstitial prominence which could reflect interstitial edema. Electronically Signed   By: Charlett Nose M.D.   On: 03/26/2023 17:27   CT CHEST ABDOMEN PELVIS WO CONTRAST  Result Date: 03/26/2023 CLINICAL DATA:  Altered mental status following resuscitation with sepsis EXAM: CT CHEST, ABDOMEN AND PELVIS WITHOUT CONTRAST TECHNIQUE: Multidetector CT imaging of the chest, abdomen and pelvis was performed following the standard protocol without IV contrast. RADIATION DOSE REDUCTION: This exam was performed according to the departmental dose-optimization program which includes automated exposure control, adjustment of the mA and/or kV according to patient size and/or use of iterative reconstruction technique. COMPARISON:  Same day chest radiograph, CT abdomen and pelvis dated 06/25/2018 FINDINGS: CT CHEST FINDINGS Cardiovascular: Normal heart size. No significant pericardial fluid/thickening. Great vessels are normal in course and caliber. Coronary artery calcifications. Mediastinum/Nodes: Diffusely heterogeneous thyroid gland. Normal esophagus. No pathologically enlarged  axillary, supraclavicular, mediastinal, or hilar lymph nodes. Lungs/Pleura: ETT terminates in the right mainstem bronchus. Layering secretions within the trachea. The central airways are patent. Diffuse peribronchial wall thickening throughout. Asymmetric right-greater-than-left interlobular septal thickening. A few scattered tree-in-bud nodules in the bilateral upper and middle lobes. Near-complete atelectasis of bilateral lower lobes. No pneumothorax. Small right and moderate left pleural effusions. Musculoskeletal: No acute or abnormal lytic or blastic osseous lesions. Multilevel degenerative changes of the thoracic spine. CT ABDOMEN PELVIS FINDINGS Hepatobiliary: No focal hepatic lesions. No intra or extrahepatic biliary ductal dilation. Cholecystectomy. Pancreas: No focal lesions or main ductal dilation. Spleen: Normal in size without focal abnormality. Adrenals/Urinary Tract: No adrenal nodules. No suspicious renal mass on this noncontrast enhanced examination , calculi, or hydronephrosis. Bladder is decompressed with catheter in-situ. Stomach/Bowel: Enteric tube terminates in the gastric antrum. Left lower quadrant colostomy. No evidence of bowel wall thickening, distention, or inflammatory changes. Appendix is not discretely seen. Vascular/Lymphatic: Aortic atherosclerosis. No enlarged abdominal or pelvic lymph nodes. Reproductive: No adnexal masses. Other: Trace free fluid. No fluid collection or free air. Musculoskeletal: No acute or abnormal lytic or blastic osseous findings. Multilevel degenerative changes of the lumbar spine. Postsurgical changes of the anterior abdominal wall. Superior endplate compression of L2. Irregular soft tissue thickening in the right inguinal region and anterior peritoneum, likely postsurgical. IMPRESSION: 1. ETT terminates in the proximal right main bronchus. Recommend retraction. 2. Asymmetric right-greater-than-left interlobular septal thickening, likely pulmonary edema. 3.  Small right and moderate left pleural effusions with near-complete atelectasis of bilateral lower lobes. 4. A few scattered tree-in-bud nodules in  the bilateral upper and middle lobes, likely infectious/inflammatory. 5. No acute abdominopelvic findings. 6. Superior endplate compression of L2, age indeterminate. 7. Heterogeneous thyroid gland. In the setting of significant comorbidities or limited life expectancy, no follow-up would be recommended. Otherwise, nonemergent ultrasound examination of the thyroid. (Ref: J Am Coll Radiol. 2015 Feb;12(2): 143-50). 8. Aortic Atherosclerosis (ICD10-I70.0). Coronary artery calcifications. Assessment for potential risk factor modification, dietary therapy or pharmacologic therapy may be warranted, if clinically indicated. Critical Value/emergent results were called by telephone at the time of interpretation on 03/26/2023 at 3:14 pm to provider Dr. Welton Flakes, who verbally acknowledged these results. Electronically Signed   By: Agustin Cree M.D.   On: 03/26/2023 15:17   ECHOCARDIOGRAM COMPLETE  Result Date: 03/26/2023    ECHOCARDIOGRAM REPORT   Patient Name:   TYLER TRIGUEROS Date of Exam: 03/26/2023 Medical Rec #:  [id number]       Height:       63.0 in Accession #:    [id number]      Weight:       250.0 lb Date of Birth:  [birth year]        BSA:          2.126 m Patient Age:    14 years        BP:           162/70 mmHg Patient Gender: F               HR:           96 bpm. Exam Location:  Inpatient Procedure: 2D Echo, Cardiac Doppler, Color Doppler and Strain Analysis Indications:    cardiac arrest  History:        Patient has prior history of Echocardiogram examinations, most                 recent 07/05/2021. Risk Factors:Hypertension, Sleep Apnea,                 Diabetes and Former Smoker.  Sonographer:    Karma Ganja Referring Phys: Cristopher Peru  Sonographer Comments: Global longitudinal strain was attempted. IMPRESSIONS  1. Left ventricular ejection fraction, by estimation, is  50%. The left ventricle has mildly decreased function. The left ventricle demonstrates regional wall motion abnormalities with severe hypokinesis of the apical septal wall and true apex. There is mild concentric left ventricular hypertrophy. Left ventricular diastolic parameters are consistent with Grade I diastolic dysfunction (impaired relaxation).  2. Right ventricular systolic function is mildly reduced. The right ventricular size is normal. Tricuspid regurgitation signal is inadequate for assessing PA pressure.  3. Left atrial size was mildly dilated.  4. Right atrial size was mildly dilated.  5. The mitral valve is normal in structure. No evidence of mitral valve regurgitation. No evidence of mitral stenosis.  6. The aortic valve is tricuspid. There is mild calcification of the aortic valve. Aortic valve regurgitation is not visualized. No aortic stenosis is present.  7. The inferior vena cava is normal in size with greater than 50% respiratory variability, suggesting right atrial pressure of 3 mmHg. FINDINGS  Left Ventricle: Left ventricular ejection fraction, by estimation, is 50%. The left ventricle has mildly decreased function. The left ventricle demonstrates regional wall motion abnormalities. The left ventricular internal cavity size was normal in size. There is mild concentric left ventricular hypertrophy. Left ventricular diastolic parameters are consistent with Grade I diastolic dysfunction (impaired relaxation). Right Ventricle: The right ventricular size is normal. No increase in  right ventricular wall thickness. Right ventricular systolic function is mildly reduced. Tricuspid regurgitation signal is inadequate for assessing PA pressure. Left Atrium: Left atrial size was mildly dilated. Right Atrium: Right atrial size was mildly dilated. Pericardium: There is no evidence of pericardial effusion. Mitral Valve: The mitral valve is normal in structure. There is mild calcification of the mitral valve  leaflet(s). Mild mitral annular calcification. No evidence of mitral valve regurgitation. No evidence of mitral valve stenosis. Tricuspid Valve: The tricuspid valve is normal in structure. Tricuspid valve regurgitation is not demonstrated. Aortic Valve: The aortic valve is tricuspid. There is mild calcification of the aortic valve. Aortic valve regurgitation is not visualized. No aortic stenosis is present. Aortic valve mean gradient measures 5.0 mmHg. Aortic valve peak gradient measures 8.8 mmHg. Aortic valve area, by VTI measures 1.05 cm. Pulmonic Valve: The pulmonic valve was normal in structure. Pulmonic valve regurgitation is not visualized. Aorta: The aortic root is normal in size and structure. Venous: The inferior vena cava is normal in size with greater than 50% respiratory variability, suggesting right atrial pressure of 3 mmHg. IAS/Shunts: No atrial level shunt detected by color flow Doppler.  LEFT VENTRICLE PLAX 2D LVIDd:         3.60 cm   Diastology LVIDs:         1.80 cm   LV e' medial:    9.36 cm/s LV PW:         1.20 cm   LV E/e' medial:  11.9 LV IVS:        1.30 cm   LV e' lateral:   12.50 cm/s LVOT diam:     1.90 cm   LV E/e' lateral: 8.9 LV SV:         26 LV SV Index:   12 LVOT Area:     2.84 cm  RIGHT VENTRICLE            IVC RV S prime:     6.96 cm/s  IVC diam: 1.70 cm TAPSE (M-mode): 1.8 cm LEFT ATRIUM           Index        RIGHT ATRIUM           Index LA diam:      2.50 cm 1.18 cm/m   RA Area:     18.30 cm LA Vol (A2C): 23.9 ml 11.24 ml/m  RA Volume:   49.90 ml  23.47 ml/m LA Vol (A4C): 21.9 ml 10.30 ml/m  AORTIC VALVE AV Area (Vmax):    1.07 cm AV Area (Vmean):   0.99 cm AV Area (VTI):     1.05 cm AV Vmax:           148.00 cm/s AV Vmean:          103.000 cm/s AV VTI:            0.244 m AV Peak Grad:      8.8 mmHg AV Mean Grad:      5.0 mmHg LVOT Vmax:         56.10 cm/s LVOT Vmean:        36.000 cm/s LVOT VTI:          0.090 m LVOT/AV VTI ratio: 0.37  AORTA Ao Root diam: 2.40 cm  MITRAL VALVE MV Area (PHT): 5.02 cm     SHUNTS MV Decel Time: 151 msec     Systemic VTI:  0.09 m MV E velocity: 111.00 cm/s  Systemic Diam: 1.90  cm MV A velocity: 107.00 cm/s MV E/A ratio:  1.04 Dalton McleanMD Electronically signed by Wilfred Lacy Signature Date/Time: 03/26/2023/2:40:18 PM    Final    CT Head Wo Contrast  Result Date: 03/26/2023 CLINICAL DATA:  Provided history: Facial trauma, blunt. Ataxia, head trauma. Additional history provided: Fall. Patient subsequently became apneic/pulseless. EXAM: CT HEAD WITHOUT CONTRAST CT CERVICAL SPINE WITHOUT CONTRAST TECHNIQUE: Multidetector CT imaging of the head and cervical spine was performed following the standard protocol without intravenous contrast. Multiplanar CT image reconstructions of the cervical spine were also generated. RADIATION DOSE REDUCTION: This exam was performed according to the departmental dose-optimization program which includes automated exposure control, adjustment of the mA and/or kV according to patient size and/or use of iterative reconstruction technique. COMPARISON:  Head CT 07/04/2021.  Cervical spine CT 07/04/2021. FINDINGS: CT HEAD FINDINGS Brain: Generalized parenchymal atrophy. 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 Background mild patchy and ill-defined hypoattenuation within the cerebral white matter, nonspecific but compatible with chronic small ischemic disease. There is no acute intracranial hemorrhage. No acute demarcated cortical infarct. No extra-axial fluid collection. No evidence of an intracranial mass. No midline shift. Vascular: No hyperdense vessel. Atherosclerotic calcifications. Skull: No calvarial fracture or aggressive osseous lesion. Sinuses/Orbits: No mass or acute finding within the imaged orbits. Small-volume secretions within the left sphenoid sinus. Mild mucosal thickening within the bilateral ethmoid sinuses. Other: Bilateral mastoid effusions.  CT CERVICAL SPINE FINDINGS Alignment: Nonspecific straightening of the expected cervical lordosis. No significant spondylolisthesis. Skull base and vertebrae: The basion-dental and atlanto-dental intervals are maintained.No evidence of acute fracture to the cervical spine. Probable facet ankylosis at C7-T1 and T1-T2. Soft tissues and spinal canal: No prevertebral fluid or swelling. No visible canal hematoma. Disc levels: Cervical spondylosis with multilevel disc space narrowing, disc bulges/central disc protrusions, posterior disc osteophyte complexes and uncovertebral hypertrophy. Disc space narrowing is greatest at C5-C6 (moderate-to-advanced) and C6-C7 (advanced). No appreciable high-grade spinal canal stenosis. Multilevel bony neural foraminal narrowing. Multilevel ventrolateral osteophytes. Upper chest: Separately reported on same day CT chest/abdomen/pelvis. Other: Enlarged heterogeneous thyroid gland. Mild enlargement of the right thyroid lobe also noted. IMPRESSION: CT head: 1.  No evidence of an acute intracranial abnormality. 2. 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 3. Background parenchymal atrophy and chronic small vessel ischemic disease. 4. Paranasal sinus disease and bilateral mastoid effusions (in the presence of life-support tubes). CT cervical spine: 1. No evidence of an acute cervical spine fracture. 2. Nonspecific straightening of the expected cervical lordosis. 3. Cervical spondylosis as described. 4. Heterogeneous and mildly enlarged thyroid gland. Given the patient's age, a non-emergent thyroid ultrasound may be obtained for further evaluation as clinically appropriate. Reference: J Am Coll Radiol. 2015 Feb;12(2): 143-50. Electronically Signed   By: Jackey Loge D.O.   On: 03/26/2023 13:58   CT Cervical Spine Wo Contrast  Result Date: 03/26/2023 CLINICAL DATA:  Provided history: Facial trauma, blunt. Ataxia, head trauma. Additional history provided: Fall.  Patient subsequently became apneic/pulseless. EXAM: CT HEAD WITHOUT CONTRAST CT CERVICAL SPINE WITHOUT CONTRAST TECHNIQUE: Multidetector CT imaging of the head and cervical spine was performed following the standard protocol without intravenous contrast. Multiplanar CT image reconstructions of the cervical spine were also generated. RADIATION DOSE REDUCTION: This exam was performed according to the departmental dose-optimization program which includes automated exposure control, adjustment of the mA and/or kV according to patient size and/or use of iterative reconstruction technique. COMPARISON:  Head CT 07/04/2021.  Cervical spine CT 07/04/2021. FINDINGS: CT HEAD FINDINGS Brain: Generalized parenchymal atrophy. 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 Background mild patchy and ill-defined hypoattenuation within the cerebral white matter, nonspecific but compatible with chronic small ischemic disease. There is no acute intracranial hemorrhage. No acute demarcated cortical infarct. No extra-axial fluid collection. No evidence of an intracranial mass. No midline shift. Vascular: No hyperdense vessel. Atherosclerotic calcifications. Skull: No calvarial fracture or aggressive osseous lesion. Sinuses/Orbits: No mass or acute finding within the imaged orbits. Small-volume secretions within the left sphenoid sinus. Mild mucosal thickening within the bilateral ethmoid sinuses. Other: Bilateral mastoid effusions. CT CERVICAL SPINE FINDINGS Alignment: Nonspecific straightening of the expected cervical lordosis. No significant spondylolisthesis. Skull base and vertebrae: The basion-dental and atlanto-dental intervals are maintained.No evidence of acute fracture to the cervical spine. Probable facet ankylosis at C7-T1 and T1-T2. Soft tissues and spinal canal: No prevertebral fluid or swelling. No visible canal hematoma. Disc levels: Cervical spondylosis with multilevel disc  space narrowing, disc bulges/central disc protrusions, posterior disc osteophyte complexes and uncovertebral hypertrophy. Disc space narrowing is greatest at C5-C6 (moderate-to-advanced) and C6-C7 (advanced). No appreciable high-grade spinal canal stenosis. Multilevel bony neural foraminal narrowing. Multilevel ventrolateral osteophytes. Upper chest: Separately reported on same day CT chest/abdomen/pelvis. Other: Enlarged heterogeneous thyroid gland. Mild enlargement of the right thyroid lobe also noted. IMPRESSION: CT head: 1.  No evidence of an acute intracranial abnormality. 2. 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 3. Background parenchymal atrophy and chronic small vessel ischemic disease. 4. Paranasal sinus disease and bilateral mastoid effusions (in the presence of life-support tubes). CT cervical spine: 1. No evidence of an acute cervical spine fracture. 2. Nonspecific straightening of the expected cervical lordosis. 3. Cervical spondylosis as described. 4. Heterogeneous and mildly enlarged thyroid gland. Given the patient's age, a non-emergent thyroid ultrasound may be obtained for further evaluation as clinically appropriate. Reference: J Am Coll Radiol. 2015 Feb;12(2): 143-50. Electronically Signed   By: Jackey Loge D.O.   On: 03/26/2023 13:58   DG Chest Portable 1 View  Result Date: 03/26/2023 CLINICAL DATA:  Post intubation. EXAM: PORTABLE CHEST 1 VIEW COMPARISON:  Radiographs 03/17/2023 and 07/04/2021. FINDINGS: 1200 hours. Tip of the endotracheal tube is approximately 1.5 cm above the carina. Enteric tube projects below the diaphragm, tip not visualized. The heart is enlarged, but stable. There is aortic atherosclerosis. New pulmonary edema with asymmetric right basilar airspace disease and a possible small right pleural effusion. No evidence of pneumothorax. No acute osseous findings are evident. External pacer and telemetry leads overlie the chest. IMPRESSION:  1. Tip of the endotracheal tube is just above the carina. Consider withdrawing 2-3 cm for more optimal positioning. 2. New pulmonary edema with asymmetric right basilar airspace disease and possible small right pleural effusion. Findings suggest congestive heart failure or aspiration. Electronically Signed   By: Carey Bullocks M.D.   On: 03/26/2023 13:30   DG Abdomen 1 View  Result Date: 03/26/2023 CLINICAL DATA:  OGT placement. EXAM: ABDOMEN - 1 VIEW COMPARISON:  None available. FINDINGS: Enteric tube tip projects over the distal stomach. Mild gaseous distention of small-bowel loops in the pelvis. IMPRESSION: Enteric tube tip projects over the distal stomach. Mild gaseous distention of small-bowel loops in the pelvis, nonspecific. Electronically Signed   By: Orvan Falconer M.D.   On: 03/26/2023 12:19   DG Chest Port 1 View  Result Date: 03/17/2023 CLINICAL DATA:  Chest pain EXAM: PORTABLE CHEST  1 VIEW COMPARISON:  July 04, 2021 FINDINGS: Normal for technique cardiac silhouette. Calcific atherosclerotic disease of the aorta. Left lung base possible atelectasis. Mild pulmonary vascular congestion. Normal osseous structures. IMPRESSION: 1. Mild pulmonary vascular congestion. 2. Left lung base possible atelectasis. Electronically Signed   By: Ted Mcalpine M.D.   On: 03/17/2023 18:08    Micro Results    Today   Subjective    [place] today has no headache,no chest abdominal pain,no new weakness tingling or numbness, feels much better     Objective   Blood pressure (!) 120/93, pulse (!) 105, temperature 98.2 F (36.8 C), temperature source Oral, resp. rate 17, height 5\' 3"  (1.6 m), weight 92.9 kg, SpO2 100%.   Intake/Output Summary (Last 24 hours) at 04/07/2023 1031 Last data filed at 04/02/2023 2000 Gross per 24 hour  Intake --  Output 350 ml  Net -350 ml    Exam  Awake Alert, No new F.N deficits,    [place].AT,PERRAL Supple Neck,   Symmetrical Chest wall movement, Good  air movement bilaterally, CTAB RRR,No Gallops,   +ve B.Sounds, Abd Soft, Non tender, stable colostomy site No Cyanosis, Clubbing or edema    Data Review   Recent Labs  Lab 03/30/23 0426 03/31/23 0237 04/01/23 0305 04/02/23 0239 04/26/2023 0441  WBC 10.6* 10.5 11.9* 12.3* 13.8*  HGB 8.3* 8.2* 7.7* 8.9* 9.3*  HCT 29.6* 29.9* 27.5* 30.6* 32.0*  PLT 157 185 201 209 232  MCV 90.2 90.3 89.6 90.0 90.4  MCH 25.3* 24.8* 25.1* 26.2 26.3  MCHC 28.0* 27.4* 28.0* 29.1* 29.1*  RDW 15.0 15.0 15.0 15.5 15.3  LYMPHSABS  --   --   --  2.0 2.2  MONOABS  --   --   --  1.1* 1.0  EOSABS  --   --   --  1.4* 1.2*  BASOSABS  --   --   --  0.1 0.0    Recent Labs  Lab 03/28/23 0503 03/29/23 0355 03/29/23 0357 03/30/23 0426 03/30/23 0428 03/31/23 0237 04/01/23 0305 04/01/23 0731 04/02/23 0239 04/10/2023 0441  NA 143  --  140  --  143 146* 143  --  144 142  K 3.6  --  4.2  --  4.4 4.8 4.0  --  3.9 4.1  CL 104  --  104  --  108 110 106  --  107 105  CO2 27  --  30  --  25 29 30   --  32 29  ANIONGAP 12  --  6  --  10 7 7   --  5 8  GLUCOSE 176*  --  117*  --  124* 125* 136*  --  103* 145*  BUN 23  --  22  --  20 18 17   --  14 13  CREATININE 1.01*  --  1.84*  --  1.37* 1.31* 1.23*  --  1.13* 0.90  AST 99* 42*  --  30  --   --   --   --   --   --   ALT 285* 205*  --  147*  --   --   --   --   --   --   ALKPHOS 105 117  --  120  --   --   --   --   --   --   BILITOT 0.6 0.6  --  0.6  --   --   --   --   --   --  ALBUMIN 2.5*  2.5* 2.8* 2.9* 3.0* 3.0* 3.0* 2.8*  --   --   --   BNP  --   --   --   --   --   --   --  293.8* 219.6* 395.5*  MG 2.5* 2.5*  --  2.4  --  2.4  --   --  1.9 1.9  CALCIUM 8.3*  --  8.8*  --  8.9 9.2 9.2  --  9.2 9.3    Total Time in preparing paper work, data evaluation and todays exam - 35 minutes  Signature  -    Susa Raring M.D on 04/08/2023 at 10:31 AM   -  To page go to [email or web address]

## 2023-05-02 NOTE — ED Notes (Signed)
Intubated, placed on vent

## 2023-05-02 NOTE — ED Notes (Signed)
Critical care NP and PA into room at [facility]

## 2023-05-02 NOTE — ED Notes (Signed)
CPR resumed, no pulses, BP dropped

## 2023-05-02 NOTE — Discharge Instructions (Signed)
Follow with Primary MD Sherron Monday, MD in 7 days   Get CBC, CMP, 2 view Chest X ray -  checked next visit with your primary MD   Activity: As tolerated with Full fall precautions use walker/cane & assistance as needed  Disposition Home    Diet: Heart Healthy Low Carb, 1.5 L fluid restriction per day.  Check CBGs q. ACH S.   Check your Weight same time everyday, if you gain over 2 pounds, or you develop in leg swelling, experience more shortness of breath or chest pain, call your Primary MD immediately. Follow Cardiac Low Salt Diet and 1.5 lit/day fluid restriction.  Special Instructions: If you have smoked or chewed Tobacco  in the last 2 yrs please stop smoking, stop any regular Alcohol  and or any Recreational drug use.  On your next visit with your primary care physician please Get Medicines reviewed and adjusted.  Please request your Prim.MD to go over all Hospital Tests and Procedure/Radiological results at the follow up, please get all Hospital records sent to your Prim MD by signing hospital release before you go home.  If you experience worsening of your admission symptoms, develop shortness of breath, life threatening emergency, suicidal or homicidal thoughts you must seek medical attention immediately by calling 911 or calling your MD immediately  if symptoms less severe.  You Must read complete instructions/literature along with all the possible adverse reactions/side effects for all the Medicines you take and that have been prescribed to you. Take any new Medicines after you have completely understood and accpet all the possible adverse reactions/side effects.   Do not drive when taking Pain medications.  Do not take more than prescribed Pain, Sleep and Anxiety Medications

## 2023-05-02 NOTE — Plan of Care (Signed)
  Problem: Education: Goal: Ability to describe self-care measures that may prevent or decrease complications (Diabetes Survival Skills Education) will improve Outcome: Completed/Met Goal: Individualized Educational Video(s) Outcome: Completed/Met   Problem: Coping: Goal: Ability to adjust to condition or change in health will improve Outcome: Completed/Met   Problem: Fluid Volume: Goal: Ability to maintain a balanced intake and output will improve Outcome: Completed/Met   Problem: Health Behavior/Discharge Planning: Goal: Ability to identify and utilize available resources and services will improve Outcome: Completed/Met Goal: Ability to manage health-related needs will improve Outcome: Completed/Met   Problem: Metabolic: Goal: Ability to maintain appropriate glucose levels will improve Outcome: Completed/Met   Problem: Nutritional: Goal: Maintenance of adequate nutrition will improve Outcome: Completed/Met Goal: Progress toward achieving an optimal weight will improve Outcome: Completed/Met   Problem: Skin Integrity: Goal: Risk for impaired skin integrity will decrease Outcome: Completed/Met   Problem: Tissue Perfusion: Goal: Adequacy of tissue perfusion will improve Outcome: Completed/Met   Problem: Education: Goal: Ability to manage disease process will improve Outcome: Completed/Met   Problem: Cardiac: Goal: Ability to achieve and maintain adequate cardiopulmonary perfusion will improve Outcome: Completed/Met   Problem: Neurologic: Goal: Promote progressive neurologic recovery Outcome: Completed/Met   Problem: Skin Integrity: Goal: Risk for impaired skin integrity will be minimized. Outcome: Completed/Met   Problem: Education: Goal: Knowledge of General Education information will improve Description: Including pain rating scale, medication(s)/side effects and non-pharmacologic comfort measures Outcome: Completed/Met   Problem: Health Behavior/Discharge  Planning: Goal: Ability to manage health-related needs will improve Outcome: Completed/Met   Problem: Clinical Measurements: Goal: Ability to maintain clinical measurements within normal limits will improve Outcome: Completed/Met Goal: Will remain free from infection Outcome: Completed/Met Goal: Diagnostic test results will improve Outcome: Completed/Met Goal: Respiratory complications will improve Outcome: Completed/Met Goal: Cardiovascular complication will be avoided Outcome: Completed/Met   Problem: Activity: Goal: Risk for activity intolerance will decrease Outcome: Completed/Met   Problem: Nutrition: Goal: Adequate nutrition will be maintained Outcome: Completed/Met   Problem: Coping: Goal: Level of anxiety will decrease Outcome: Completed/Met   Problem: Elimination: Goal: Will not experience complications related to bowel motility Outcome: Completed/Met Goal: Will not experience complications related to urinary retention Outcome: Completed/Met   Problem: Pain Management: Goal: General experience of comfort will improve Outcome: Completed/Met   Problem: Safety: Goal: Ability to remain free from injury will improve Outcome: Completed/Met   Problem: Skin Integrity: Goal: Risk for impaired skin integrity will decrease Outcome: Completed/Met

## 2023-05-02 NOTE — ED Notes (Signed)
CPR resumed 

## 2023-05-02 NOTE — ED Notes (Signed)
PCCM MD and EDP at [facility].

## 2023-05-02 NOTE — ED Notes (Signed)
PCCM at [facility] ?

## 2023-05-02 NOTE — Plan of Care (Signed)
I was informed by Dr. Rubin Payor in the ER on 04/04/2023 around 2:45 PM that Courtney [name] who was discharged by me earlier on 04/05/2023 has been brought to the ER in cardiorespiratory arrest went down to see her discussed his case with Dr. Rubin Payor in detail.   I subsequently called the SNF admissions phone [phone], this phone was picked up by Mr. Charlcie Cradle who connected me to a provider who according to Mr. Charlcie Cradle was Ms. Beth longevity nurse who informed me that patient was dropped at SNF by EMS and somehow the care providers were not aware of her arrival till about 30 to 35 minutes, when they went to see the patient she was already unresponsive and a code was called.  Kindly see the EMS run sheet below along with [name], ER triage nurse note below as well.  I had the opportunity of taking care of Courtney [name] kindly see Brief summary below.  78 y.o. female with history of COPD, DM-2, HTN, HFpEF, CAD s/p PCI 2011-SNF resident-mostly bed to wheelchair bound (has not ambulated for a year)- brought in to the ED following a cardiac arrest. She was intubated for airway protection-started on Levophed for hypotension-and then subsequently admitted to the ICU. 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   She was diagnosed with cardiac arrest at SNF from where she was admitted to [facility], ICU, she was in cardiogenic shock, admitted by [facility] team and seen by cardiology team by Dr. Jens Som, Hurman Horn and Dr. Wyline Mood.  She was seen by cardiology and the plan was for medical management with discontinuation of beta-blocker and outpatient follow-up with cardiology within a few weeks of discharge, patient was offered invasive testing and procedures by cardiology team but she chose to do medical management and noninvasive approach.  This was documented by  cardiology team clearly.  I assumed her care 2 days prior to discharge, plan was medical management and discharge to SNF, patient was doing fairly well, night before discharge on 04/19/2023 at 9 PM while patient was receiving a breathing treatment oxygen was left unconnected with her pulse ox dropping into mid 70s due to lack of oxygen and she had converted to high degree AV block with sinus pause for several seconds from hypoxia and increased vagal tone which resolved immediately after oxygen was connected and thereafter remained in sinus rhythm, she does have underlying history of sleep apnea as well.  Note she never lost a pulse, no medications were administered or CPR done during this event.  I think that patient seemed to be very sensitive to hypoxia increased vagal tone and likely was developing high-grade heart block with sinus pauses and response to hypoxia and increased vagal tone induced by interruption in oxygen supply.   She was monitored for another 12 hours thereafter on telemetry on her baseline oxygen without any significant dysrhythmia.  Morning of discharge on 04/25/2023 she was examined 3 different times by me along with her nurse [name], she was completely symptom-free and was looking forward to going back to SNF.  She was subsequently picked up by the ambulance around 1 PM on 05/01/2023 and dropped at the SNF at 1:17 PM on 04/30/2023, where after 30 minutes of arrival patient had an episode of cardiac arrest.  Confirmed with PTAR supervisor patient was dropped at 1:17 PM on 04/12/2023 by the  EMS at the SNF.    EMS run sheet Run Number: [id number] Call received at 1400 on 04/09/2023  - upon responding to CODE BLUE at the SNF - "Staff on scene states that the patient had just arrived back from the hospital around 14: 00 today. Staff states that the patient was " given CPR last night at the hospital" . Staff states that no one had laid eyes on the patient until the NP making rounds found the  patient unresponsive, pulseless, and apneic. GFD states that the staff had already initiated CPR when they arrived."  [facility] ER RN Triage report - Electronically signed by Betti Cruz, RN at 04/09/2023  4:59 PM - BIB [facility] from [facility] CPR in progress. Pt well known to system as she was d/c'd this am. Reported that pt was dropped off by EMS from hospital d/c and not rounded on for ~30 minutes. Pt found ~30 minutes after arrival pulseless and apneic. 6 epi given. NS 500cc IVF given. BS 262. L tib I/O present.    Patient had prolonged CPR in the ER unfortunately she could not be revived and eventually passed away few hours later.      Signature  -    Susa Raring M.D on 04/04/2023 at 12:53 PM   -  To page go to [email or web address]

## 2023-05-02 NOTE — ED Notes (Signed)
CPR resumes/ continues

## 2023-05-02 NOTE — ED Notes (Signed)
CPR continues, bagging with igel continues.

## 2023-05-02 NOTE — Progress Notes (Signed)
Maanvi Wentzell, is a 78 y.o. female, DOB - 02/12/1946, NFA:[id number]   I was informed by Dr. Rubin Payor in the ER on 04/26/2023 around 2:45 PM that Ms. Bilyeu who was discharged by me earlier on 04/21/2023 has been brought to the ER in cardiorespiratory arrest went down to see her discussed his case with Dr. Rubin Payor in detail.   I subsequently called the SNF admissions phone [phone], this phone was picked up by Mr. Charlcie Cradle who connected me to a provider who according to Mr. Charlcie Cradle was Ms. [facility] longevity nurse who informed me that patient was dropped at SNF by EMS and somehow the care providers were not aware of her arrival till about 30 to 35 minutes, when they went to see the patient she was already unresponsive and a code was called.  Kindly see the EMS run sheet below along with [name], ER triage nurse note below as well.  I had the opportunity of taking care of Ms. Susa Simmonds kindly see Brief summary below.  78 y.o. female with history of COPD, DM-2, HTN, HFpEF, CAD s/p PCI 2011-SNF resident-mostly bed to wheelchair bound (has not ambulated for a year)- brought in to the ED following a cardiac arrest. She was intubated for airway protection-started on Levophed for hypotension-and then subsequently admitted to the ICU. 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   She was diagnosed with cardiac arrest at SNF from where she was admitted to [facility], ICU, she was in cardiogenic shock, admitted by [facility] team and seen by cardiology team by Dr. Jens Som, Hurman Horn and Dr. Wyline Mood.  She was seen by cardiology and the plan was for medical management with discontinuation of beta-blocker and outpatient follow-up with cardiology within a few weeks of discharge, patient was  offered invasive testing and procedures by cardiology team but she chose to do medical management and noninvasive approach.  This was documented by cardiology team clearly.  I assumed her care 2 days prior to discharge, plan was medical management and discharge to SNF, patient was doing fairly well, night before discharge on 04/12/2023 at 9 PM while patient was receiving a breathing treatment oxygen was left unconnected with her pulse ox dropping into mid 70s due to lack of oxygen and she had converted to high degree AV block with sinus pause for several seconds from hypoxia and increased vagal tone which resolved immediately after oxygen was connected and thereafter remained in sinus rhythm, she does have underlying history of sleep apnea as well.  Note she never lost a pulse, no medications were administered or CPR done during this event.  I think that patient seemed to be very sensitive to hypoxia increased vagal tone and likely was developing high-grade heart block with sinus pauses and response to hypoxia and increased vagal tone induced by interruption in oxygen supply.   She was monitored for another 12 hours thereafter on telemetry on her baseline oxygen without any significant dysrhythmia.  Morning of discharge on 04/17/2023 she was examined 3 different times by me along with her nurse [name], she was completely symptom-free and was looking forward to going back to SNF.  She was subsequently picked up by the ambulance around 1 PM on 04/29/2023 and dropped at the SNF at 1:17 PM on 04/12/2023, where after 30 minutes of arrival patient had an episode of cardiac arrest.  Confirmed with PTAR supervisor patient was dropped at 1:17 PM on 04/09/2023 by the EMS at the SNF.    EMS run sheet Run Number: [id number] Call received at 1400 on 04/21/2023  - upon responding to CODE BLUE at the SNF - "Staff on scene states that the patient had just arrived back from the hospital around 14: 00 today. Staff states that  the patient was " given CPR last night at the hospital" . Staff states that no one had laid eyes on the patient until the NP making rounds found the patient unresponsive, pulseless, and apneic. GFD states that the staff had already initiated CPR when they arrived."  [facility] ER RN Triage report - Electronically signed by Betti Cruz, RN at 04/07/2023  4:59 PM - BIB [facility] from [facility] CPR in progress. Pt well known to system as she was d/c'd this am. Reported that pt was dropped off by EMS from hospital d/c and not rounded on for ~30 minutes. Pt found ~30 minutes after arrival pulseless and apneic. 6 epi given. NS 500cc IVF given. BS 262. L tib I/O present.    Patient had prolonged CPR in the ER unfortunately she could not be revived and eventually passed away few hours later.      Signature  -    Susa Raring M.D on 04/04/2023 at 12:57 PM   -  To page go to [email or web address]

## 2023-05-02 NOTE — ED Notes (Signed)
ABG sample obtained x1 attempt. Critical results given to Dr. Rubin Payor and B. Simpson NP CCM. RR increased as pt and peak pressures tolerated. No further orders received at this time. RT will continue to monitor and be available as needed.

## 2023-05-02 NOTE — ED Notes (Signed)
Pulses back , ROSC

## 2023-05-02 NOTE — Progress Notes (Signed)
PCCM:  Patient has been arresting >2 hours. Maxed on multiple vasopressors and a SBP that is barely palpable. Unless we can stabilize the patient in the ED it is unsafe to transfer to the ICU at this time. Dr. Rubin Payor is going to speak with family. I would agree with no escalation of care in the setting of prolonged cardiac arrest. If we stablize post arrest CCM will be glad to admit.   Josephine Igo, DO [facility] 04/11/2023 5:20 PM

## 2023-05-02 NOTE — ED Notes (Signed)
Epi given

## 2023-05-02 NOTE — ED Notes (Signed)
Pulses returned, CPR stopped

## 2023-05-02 NOTE — ED Notes (Signed)
Pulses present, ROSC

## 2023-05-02 NOTE — TOC Progression Note (Signed)
Transition of Care [facility]) - Progression Note    Patient Details  Name: [name] MRN: [id number] Date of Birth: 08-21-1945  Transition of Care [facility]) CM/SW Contact  Mearl Latin, LCSW Phone Number: 04/21/2023, 11:10 AM  Clinical Narrative:    CSW updated patient's spouse of patient's discharge. He reported understanding of plan to return to SNF via PTAR and was able to speak with [facility] yesterday.    Expected Discharge Plan: Skilled Nursing Facility Barriers to Discharge: Continued Medical Work up  Expected Discharge Plan and Services In-house Referral: Clinical Social Work   Post Acute Care Choice: Skilled Nursing Facility Living arrangements for the past 2 months: Skilled Nursing Facility Expected Discharge Date: 04/22/2023                                     Social Determinants of Health (SDOH) Interventions SDOH Screenings   Food Insecurity: Patient Declined (03/31/2023)  Transportation Needs: No Transportation Needs (03/30/2023)  Utilities: Not At Risk (03/30/2023)  Tobacco Use: Medium Risk (03/26/2023)    Readmission Risk Interventions     No data to display

## 2023-05-02 NOTE — ED Notes (Signed)
Pt asystole, pulseless and apneic. Pt has expired. EDP at [facility].

## 2023-05-02 NOTE — TOC Transition Note (Signed)
Transition of Care [facility]) - CM/SW Discharge Note   Patient Details  Name: Courtney [name] MRN: [id number] Date of Birth: [birth year]  Transition of Care [facility]) CM/SW Contact:  Mearl Latin, LCSW Phone Number: 04/29/2023, 12:02 PM   Clinical Narrative:    Patient will DC to: [facility] Anticipated DC date: 04/28/2023 Family notified: Spouse Transport by: Sharin Mons   Per MD patient ready for DC to [facility]. RN to call report prior to discharge [phone]). RN, patient, patient's family, and facility notified of DC. Discharge Summary and FL2 sent to facility. DC packet on chart. Ambulance transport requested for patient.   CSW will sign off for now as social work intervention is no longer needed. Please consult [place] again if new needs arise.     Final next level of care: Skilled Nursing Facility Barriers to Discharge: Barriers Resolved   Patient Goals and CMS Choice      Discharge Placement     Existing PASRR number confirmed : 04/22/2023          Patient chooses bed at:  [facility]) Patient to be transferred to facility by: PTAR Name of family member notified: Spouse Patient and family notified of of transfer: 05/01/2023  Discharge Plan and Services Additional resources added to the After Visit Summary for   In-house Referral: Clinical Social Work   Post Acute Care Choice: Skilled Nursing Facility                               Social Determinants of Health (SDOH) Interventions SDOH Screenings   Food Insecurity: Patient Declined (03/31/2023)  Transportation Needs: No Transportation Needs (03/30/2023)  Utilities: Not At Risk (03/30/2023)  Tobacco Use: Medium Risk (03/26/2023)     Readmission Risk Interventions     No data to display

## 2023-05-02 NOTE — ED Notes (Signed)
EDP at BS 

## 2023-05-02 NOTE — ED Notes (Signed)
Dr. Rubin Payor intubating

## 2023-05-02 NOTE — Progress Notes (Signed)
Patient was alert, awake, stable vital signs oxygen sat 99-100 % on 4 LNC in early evening before 2100 pm. The nurse went to PT room many times for assessment, took vital signs, gave insulin at 2030, checked on colostomy and pure wick at 2045 while pt still remained calm, relax stable vital signs , oxygen sat 99 on 4 LNC .  Telemetry tech called the nurse at 2118 to check on patient. The nurse came to PT room at 2118 found PT was laying on bed , respiratory distress, labor breathing  , eyes closed , oxygen sat 76 % . Oxygen tubing was laying on the chair, nebulizer tubing was connect to oxygen flow meter. The respiratory therapist administered breathing treatment for PT at 2100.   The nurse reconnected oxygen tubing with 6 L [place]  , oxygen sat increased to 93, 96, 100 %. Rapid response was called. MD, RR, chart nurse, presented at bedside. Oxygen sat at 100% on 6 L [place] , replaced with Non rebreather 15 L for for relived   lethargic . PT follows commands after having  noxious stimulus by MD.  Cardiac monitor showed multifocal pvcs, third degree heart block 23 seconds MD aware.Non re breather mask was removed shortly. Patient remains calm, relax , alert, awake , oxygen sat 100% on 4 LNC,  stable vital signs after having  50 mg PO trazodone at 0228. Colostomy back changed. Completed peri care.

## 2023-05-02 NOTE — ED Notes (Signed)
CPR continue resumed/ re-started. EDP back into room.

## 2023-05-02 NOTE — ED Notes (Signed)
EDP [place] present. Xray at [facility] (repeat abd).

## 2023-05-02 NOTE — ED Notes (Addendum)
RT and pharm D, remain at [facility]. Manual BP unable.

## 2023-05-02 NOTE — ED Notes (Signed)
Pt initially arrived at 1502 with Igel airway in place, easy to bag with bilateral breath sounds heard. I gel left in per MD until intubation at 1545.

## 2023-05-02 NOTE — ED Notes (Signed)
Pulses back, palpated and dopplered. HR 127, Levo continues at 63mcg/min, HR 127. Continues to be bagged, no spontaneous resps.

## 2023-05-02 NOTE — ED Notes (Addendum)
PCCM team present, EDP present, no escalation of efforts, weak thready irregular pulses, pt now no code/ no CPR. Drips continue.

## 2023-05-02 NOTE — ED Notes (Addendum)
Epi given. Continuing to try and establish IV, unsuccessful. Difficult stick. Multiple attemptss by multiple RNs. [place] at [facility]. CPR continues.

## 2023-05-02 NOTE — ED Provider Notes (Signed)
[facility] AT [facility] Provider Note   CSN: [id number] Arrival date & time: 04/09/2023  1501     History  Chief Complaint  Patient presents with   Cardiac Arrest    Courtney [name] is a 78 y.o. female.   Cardiac Arrest Patient presents after cardiac arrest.  Reportedly had chest arrived at nursing home after recent admission to hospital for cardiac arrest.  Per EMS had arrived about half hour prior and then was found by staff without a pulse.  Patient had had pulse on and off for EMS and shortly before arrival lost pulse again.  Reviewed discharge note.  Also discussed with Dr. Thedore Mins, hospitalist at discharge her earlier today.    Past Medical History:  Diagnosis Date   Anemia    Arthritis    Asthma    CAD (coronary artery disease)    COPD (chronic obstructive pulmonary disease) (HCC)    Diabetes mellitus    Hyperlipidemia    Hypertension    Myocardial infarct (HCC)    Postmenopausal atrophic vaginitis    Restless leg syndrome     Home Medications Prior to Admission medications   Medication Sig Start Date End Date Taking? Authorizing Provider  acetaminophen (TYLENOL) 325 MG tablet Take 650 mg by mouth every 6 (six) hours as needed for moderate pain (pain score 4-6).    [provider]  amLODipine (NORVASC) 10 MG tablet Take 1 tablet (10 mg total) by mouth daily. 07/09/18   Dhungel, Theda Belfast, MD  aspirin EC 81 MG EC tablet Take 1 tablet (81 mg total) by mouth daily. 07/09/18   Dhungel, Theda Belfast, MD  atorvastatin (LIPITOR) 80 MG tablet Take 80 mg by mouth at bedtime.    [provider]  cephALEXin (KEFLEX) 500 MG capsule Take 1 capsule (500 mg total) by mouth 3 (three) times daily for 5 days. 04/02/2023 04/08/23  Leroy Sea, MD  Cholecalciferol 25 MCG (1000 UT) tablet Take 1,000 Units by mouth in the morning.    [provider]  doxycycline (VIBRA-TABS) 100 MG tablet Take 1 tablet (100 mg total) by mouth every 12  (twelve) hours. 04/12/2023   Leroy Sea, MD  ferrous sulfate 325 (65 FE) MG tablet Take 1 tablet (325 mg total) by mouth 2 (two) times daily with a meal. Patient taking differently: Take 325 mg by mouth daily. 10/21/19   Rhetta Mura, MD  fluticasone (FLONASE) 50 MCG/ACT nasal spray Place 1 spray into both nostrils daily. 09/19/19   [provider]  furosemide (LASIX) 40 MG tablet Take 1 tablet (40 mg total) by mouth 2 (two) times daily. Patient taking differently: Take 40 mg by mouth daily. 07/08/21 03/26/23  Jerald Kief, MD  glucagon (GLUCAGEN HYPOKIT) 1 MG SOLR injection Inject 1 mg into the muscle daily as needed for low blood sugar.    [provider]  Glycopyrrolate-Formoterol (BEVESPI AEROSPHERE) 9-4.8 MCG/ACT AERO Inhale 2 puffs into the lungs 2 (two) times daily.    [provider]  insulin lispro (HUMALOG) 100 UNIT/ML cartridge Before each meal 3 times a day, 140-199 - 2 units, 200-250 - 4 units, 251-299 - 6 units,  300-349 - 8 units,  350 or above 10 units. 04/23/2023   Leroy Sea, MD  ipratropium-albuterol (DUONEB) 0.5-2.5 (3) MG/3ML SOLN Take 3 mLs by nebulization every 6 (six) hours as needed. Patient taking differently: Take 3 mLs by nebulization 3 (three) times daily. 07/08/18   Eddie North, MD  Melatonin 10 MG TABS Take 10 mg by mouth at bedtime.    [provider]  metFORMIN (GLUCOPHAGE) 500 MG tablet Take 500 mg by mouth 2 (two) times daily with a meal.    [provider]  nitroGLYCERIN (NITROSTAT) 0.4 MG SL tablet Place 0.4 mg under the tongue every 5 (five) minutes as needed for chest pain. 03/06/21   [provider]  Nutritional Supplements (NUTRITIONAL DRINK PO) Take 1 each by mouth 2 (two) times daily.    [provider]  OXYGEN Inhale 2 L into the lungs continuous.    [provider]  polyethylene glycol (MIRALAX / GLYCOLAX) 17 g packet Take 17 g by mouth daily.    [provider]  saccharomyces boulardii (FLORASTOR) 250 MG capsule Take 250 mg by mouth 2 (two) times daily.    [provider]  senna (SENOKOT) 8.6 MG TABS tablet Take 2 tablets by mouth 2 (two) times daily.    [provider]  sertraline (ZOLOFT) 50 MG tablet Take 50 mg by mouth daily.     [provider]      Allergies    Egg-derived products and Penicillins    Review of Systems   Review of Systems  Physical Exam Updated Vital Signs BP (!) 93/24   Pulse 66   Resp 11   Ht 5\' 3"  (1.6 m)   Wt 92.5 kg   SpO2 100%   BMI 36.14 kg/m  Physical Exam Vitals and nursing note reviewed.  HENT:     Mouth/Throat:     Comments: I gel in place. Cardiovascular:     Comments: Pulseless. Pulmonary:     Comments: Diffuse harsh breath sounds/rales with bagging. Abdominal:     Comments: Ostomy to left abdomen.  Musculoskeletal:     Comments: I/O line in left tibia.  Neurological:     Comments: Unresponsive.  No corneal reflex.  No spontaneous breaths.     ED Results / Procedures / Treatments   Labs (all labs ordered are listed, but only abnormal results are displayed) Labs Reviewed  COMPREHENSIVE METABOLIC PANEL - Abnormal; Notable for the following components:      Result Value   Glucose, Bld 222 (*)    Creatinine, Ser 1.31 (*)    Total Protein 5.6 (*)    Albumin 2.5 (*)    AST 647 (*)    ALT 414 (*)    Alkaline Phosphatase 295 (*)    GFR, Estimated 41 (*)    All other components within normal limits  CBC WITH DIFFERENTIAL/PLATELET - Abnormal; Notable for the following components:   WBC 32.9 (*)    RBC 3.72 (*)    Hemoglobin 9.4 (*)    HCT 35.4 (*)    MCH 25.3 (*)    MCHC 26.6 (*)    Platelets 409 (*)    nRBC 1.9 (*)    Neutro Abs 21.1 (*)    Lymphs Abs 8.2 (*)    Eosinophils Absolute 1.0 (*)    Basophils Absolute 0.3 (*)    nRBC 3 (*)    Abs Immature Granulocytes 1.30 (*)    All other components within normal limits  LACTIC ACID, PLASMA - Abnormal;  Notable for the following components:   Lactic Acid, Venous 7.2 (*)    All other components within normal limits  CBG MONITORING, ED - Abnormal; Notable for the following components:   Glucose-Capillary 169 (*)    All other components within normal limits  I-STAT ARTERIAL BLOOD GAS, ED - Abnormal; Notable for the following components:   pH, Arterial 7.014 (*)    pCO2 arterial 103.3 (*)    pO2, Arterial 261 (*)    Acid-base deficit 6.0 (*)    HCT 33.0 (*)    Hemoglobin 11.2 (*)    All other components within normal limits  I-STAT CG4 LACTIC ACID, ED - Abnormal; Notable for the following components:   Lactic Acid, Venous 7.6 (*)    All other components within normal limits  TROPONIN I (HIGH SENSITIVITY) - Abnormal; Notable for the following components:   Troponin I (High Sensitivity) 271 (*)    All other components within normal limits  CULTURE, BLOOD (ROUTINE X 2)  CULTURE, BLOOD (ROUTINE X 2)  LACTIC ACID, PLASMA  TROPONIN I (HIGH SENSITIVITY)    EKG None  Radiology DG Chest Portable 1 View  Result Date: 04/05/2023 CLINICAL DATA:  Cardiac arrest status post intubation EXAM: PORTABLE CHEST 1 VIEW COMPARISON:  Chest radiograph dated 04/03/2019 at 6:21 a.m. FINDINGS: Lines/tubes: Endotracheal tube tip projects 1.3 cm above the carina. Lungs: Well inflated lungs. Bilateral interstitial and diffuse patchy opacities. Heart Pleura: Decreased but persistent small bilateral pleural effusions. No pneumothorax. Heart/mediastinum: Similar  cardiomediastinal silhouette. Bones: Multiple displaced rib fractures involving the right second and fourth through sixth ribs. IMPRESSION: 1. Endotracheal tube tip projects 1.3 cm above the carina. 2. Bilateral interstitial and diffuse patchy opacities, likely pulmonary edema. 3. Decreased but persistent small bilateral pleural effusions. 4. Multiple displaced rib fractures involving the right second and fourth through sixth ribs. Electronically Signed   By:  Agustin Cree M.D.   On: 04/25/2023 16:56   DG Abd Portable 1V  Result Date: 04/04/2023 CLINICAL DATA:  Nasogastric tube placement. EXAM: PORTABLE ABDOMEN - 1 VIEW COMPARISON:  March 26, 2023. FINDINGS: Distal tip of nasogastric tube is seen in expected position of the stomach. No abnormal bowel dilatation is noted. IMPRESSION: Distal tip of nasogastric tube is seen in expected position of the stomach. Electronically Signed   By: Lupita Raider M.D.   On: 04/15/2023 16:54   DG Chest Port 1 View  Result Date: 04/30/2023 CLINICAL DATA:  Shortness of breath. EXAM: PORTABLE CHEST 1 VIEW COMPARISON:  03/27/2023 FINDINGS: Low volume film. The cardio pericardial silhouette is enlarged. Vascular congestion with diffuse interstitial opacity suggests edema. Bibasilar collapse/consolidation with bilateral pleural effusions, progressive in the interval. Left subclavian central line again noted with tip overlying the region of the innominate vein confluence. Telemetry leads overlie the chest. IMPRESSION: Progressive bibasilar collapse/consolidation with bilateral pleural effusions. Cardiomegaly with features suggesting interstitial edema. Electronically Signed   By: Kennith Center M.D.   On: 04/08/2023 08:30    Procedures Procedure Name: Intubation Date/Time: 04/16/2023 4:00 PM  Performed by: Benjiman Core, MDPre-anesthesia Checklist: Patient identified Preoxygenation: Pre-oxygenation with 100% oxygen Laryngoscope Size: Glidescope and 3 Grade View: Grade II Tube type: Subglottic suction tube Tube size: 7.5 mm Number of attempts: 1 Placement Confirmation: ETT inserted through vocal cords under direct vision and Breath sounds checked- equal and bilateral Dental Injury: Injury to lip         Medications Ordered in ED Medications  norepinephrine (LEVOPHED) 4mg  in (0.016 mg/mL) premix infusion (0 mcg/min Intravenous Stopped 04/10/2023 1734)  EPINEPHrine (ADRENALIN) 1 MG/10ML injection (1 mg  Intravenous Given 04/27/2023 1518)  EPINEPHrine (ADRENALIN) 5 mg in NS 250 mL (0.02 mg/mL) premix infusion (0 mcg/min Intravenous Stopped 04/27/2023 1734)  sodium bicarbonate injection (50 mEq  Intravenous Given 04/09/2023 1534)  vasopressin (PITRESSIN) 20 Units in 100 mL (0.2 unit/mL) infusion-*FOR SHOCK* (0 Units/min Intravenous Stopped 04/17/2023 1734)    ED Course/ Medical Decision Making/ A&P                                 Medical Decision Making Amount and/or Complexity of Data Reviewed Labs: ordered. Radiology: ordered.  Risk Prescription drug management.   Patient presented in cardiac arrest.  Recent admission for same.  Discussed with provider who saw her here and stated that she had refused intervention such as cath or pacemaker.  Patient had multiple episodes of cardiac arrest while here.  Would have heart rate go down, however would just lose pulse not necessarily bradycardic enough for that.  Will get hypoxic.  Was on Levophed initially then epinephrine and then eventually vasopressin.  All maxed out or greater and continued to have episodes of cardiac arrest.  Discussed with patient's husband, Courtney [name].  Said that they would want everything done.  Discussed with Dr. Tonia Brooms from ICU.  We decided that after around 2 hours of on and off CPR likely futile.  Would not escalate care.  Had been intubated.  At 1730 patient finally arrested again.  Time of death at that time.  Patient is not an ME case.  CRITICAL CARE Performed by: Benjiman Core Total critical care time: 60 minutes Critical care time was exclusive of separately billable procedures and treating other patients. Critical care was necessary to treat or prevent imminent or life-threatening deterioration. Critical care was time spent personally by me on the following activities: development of treatment plan with patient and/or surrogate as well as nursing, discussions with consultants, evaluation of patient's response to  treatment, examination of patient, obtaining history from patient or surrogate, ordering and performing treatments and interventions, ordering and review of laboratory studies, ordering and review of radiographic studies, pulse oximetry and re-evaluation of patient's condition.  Cardiopulmonary Resuscitation (CPR) Procedure Note Directed/Performed by: Benjiman Core I personally directed ancillary staff and/or performed CPR in an effort to regain return of spontaneous circulation and to maintain cardiac, neuro and systemic perfusion.           Final Clinical Impression(s) / ED Diagnoses Final diagnoses:  Cardiac arrest [facility])    Rx / DC Orders ED Discharge Orders     None         Benjiman Core, MD 04/25/2023 1756

## 2023-05-02 NOTE — ED Notes (Addendum)
Pulses continue, 2 PIV, 1 I/O, preparing to intubate. Blood sent to lab.

## 2023-05-02 NOTE — ED Notes (Addendum)
Hospitalist MD at BS.

## 2023-05-02 NOTE — ED Triage Notes (Addendum)
BIB [facility] from [facility] CPR in progress. Pt well known to system as she was d/c'd this am. Reported that pt was dropped off by EMS from hospital d/c and not rounded on for ~30 minutes. Pt found ~30 minutes after arrival pulseless and apneic. 6 epi given. NS 500cc IVF given. BS 262. L tib I/O present.

## 2023-05-02 NOTE — ED Notes (Signed)
Manual BP unable

## 2023-05-02 NOTE — ED Notes (Signed)
pCXR at BS 

## 2023-05-02 NOTE — ED Notes (Signed)
Bigemminy HR 82

## 2023-05-02 DEATH — deceased
